# Patient Record
Sex: Male | Born: 1940 | Race: Black or African American | Hispanic: No | Smoking: Former smoker
Health system: Southern US, Community
[De-identification: ages and names within clinical notes are randomized; demographics above are authoritative.]

## PROBLEM LIST (undated history)

## (undated) DIAGNOSIS — N200 Calculus of kidney: Secondary | ICD-10-CM

## (undated) DIAGNOSIS — N400 Enlarged prostate without lower urinary tract symptoms: Secondary | ICD-10-CM

## (undated) DIAGNOSIS — Z8719 Personal history of other diseases of the digestive system: Secondary | ICD-10-CM

## (undated) DIAGNOSIS — I341 Nonrheumatic mitral (valve) prolapse: Secondary | ICD-10-CM

## (undated) DIAGNOSIS — K219 Gastro-esophageal reflux disease without esophagitis: Secondary | ICD-10-CM

## (undated) DIAGNOSIS — I639 Cerebral infarction, unspecified: Secondary | ICD-10-CM

## (undated) DIAGNOSIS — I10 Essential (primary) hypertension: Secondary | ICD-10-CM

## (undated) DIAGNOSIS — K5730 Diverticulosis of large intestine without perforation or abscess without bleeding: Secondary | ICD-10-CM

## (undated) DIAGNOSIS — M545 Low back pain, unspecified: Secondary | ICD-10-CM

## (undated) DIAGNOSIS — I2510 Atherosclerotic heart disease of native coronary artery without angina pectoris: Secondary | ICD-10-CM

## (undated) DIAGNOSIS — R220 Localized swelling, mass and lump, head: Secondary | ICD-10-CM

## (undated) DIAGNOSIS — G5603 Carpal tunnel syndrome, bilateral upper limbs: Secondary | ICD-10-CM

## (undated) DIAGNOSIS — K759 Inflammatory liver disease, unspecified: Secondary | ICD-10-CM

## (undated) DIAGNOSIS — E119 Type 2 diabetes mellitus without complications: Secondary | ICD-10-CM

## (undated) DIAGNOSIS — K4090 Unilateral inguinal hernia, without obstruction or gangrene, not specified as recurrent: Secondary | ICD-10-CM

## (undated) DIAGNOSIS — H269 Unspecified cataract: Secondary | ICD-10-CM

## (undated) DIAGNOSIS — E785 Hyperlipidemia, unspecified: Secondary | ICD-10-CM

## (undated) DIAGNOSIS — Z86010 Personal history of colonic polyps: Secondary | ICD-10-CM

## (undated) DIAGNOSIS — I6529 Occlusion and stenosis of unspecified carotid artery: Secondary | ICD-10-CM

## (undated) HISTORY — DX: Localized swelling, mass and lump, head: R22.0

## (undated) HISTORY — DX: Hyperlipidemia, unspecified: E78.5

## (undated) HISTORY — DX: Atherosclerotic heart disease of native coronary artery without angina pectoris: I25.10

## (undated) HISTORY — DX: Morbid (severe) obesity due to excess calories: E66.01

## (undated) HISTORY — DX: Unspecified cataract: H26.9

## (undated) HISTORY — DX: Type 2 diabetes mellitus without complications: E11.9

## (undated) HISTORY — DX: Occlusion and stenosis of unspecified carotid artery: I65.29

## (undated) HISTORY — DX: Essential (primary) hypertension: I10

## (undated) HISTORY — PX: CATARACT EXTRACTION: SUR2

## (undated) HISTORY — DX: Calculus of kidney: N20.0

## (undated) HISTORY — PX: APPENDECTOMY: SHX54

## (undated) HISTORY — DX: Inflammatory liver disease, unspecified: K75.9

## (undated) HISTORY — DX: Nonrheumatic mitral (valve) prolapse: I34.1

## (undated) HISTORY — DX: Low back pain, unspecified: M54.50

## (undated) HISTORY — PX: CARDIAC CATHETERIZATION: SHX172

## (undated) HISTORY — DX: Carpal tunnel syndrome, bilateral upper limbs: G56.03

## (undated) HISTORY — PX: CORONARY ARTERY BYPASS GRAFT: SHX141

## (undated) HISTORY — DX: Low back pain: M54.5

## (undated) HISTORY — DX: Benign prostatic hyperplasia without lower urinary tract symptoms: N40.0

## (undated) HISTORY — DX: Personal history of colonic polyps: Z86.010

## (undated) HISTORY — DX: Diverticulosis of large intestine without perforation or abscess without bleeding: K57.30

---

## 2003-12-04 LAB — HM COLONOSCOPY

## 2004-10-28 ENCOUNTER — Ambulatory Visit: Payer: Self-pay | Admitting: Internal Medicine

## 2005-04-24 ENCOUNTER — Ambulatory Visit: Payer: Self-pay | Admitting: Internal Medicine

## 2005-05-20 ENCOUNTER — Ambulatory Visit: Payer: Self-pay | Admitting: Internal Medicine

## 2005-10-22 ENCOUNTER — Ambulatory Visit: Payer: Self-pay | Admitting: Internal Medicine

## 2005-10-29 ENCOUNTER — Ambulatory Visit: Payer: Self-pay | Admitting: Internal Medicine

## 2006-06-18 ENCOUNTER — Ambulatory Visit: Payer: Self-pay | Admitting: Internal Medicine

## 2006-11-05 ENCOUNTER — Ambulatory Visit: Payer: Self-pay | Admitting: Internal Medicine

## 2006-11-05 LAB — CONVERTED CEMR LAB
BUN: 10 mg/dL (ref 6–23)
Basophils Absolute: 0 10*3/uL (ref 0.0–0.1)
Basophils Relative: 1 % (ref 0.0–1.0)
CO2: 34 meq/L — ABNORMAL HIGH (ref 19–32)
Calcium: 9.3 mg/dL (ref 8.4–10.5)
Eosinophils Absolute: 0.1 10*3/uL (ref 0.0–0.6)
GFR calc Af Amer: 86 mL/min
GFR calc non Af Amer: 71 mL/min
HDL: 39.3 mg/dL (ref >39.0)
Hgb A1c MFr Bld: 6.5 % — ABNORMAL HIGH (ref 4.6–6.0)
Lymphocytes Relative: 52.1 % — ABNORMAL HIGH (ref 12.0–46.0)
MCHC: 34.6 g/dL (ref 30.0–36.0)
Monocytes Relative: 10.6 % (ref 3.0–11.0)
Neutro Abs: 1.2 10*3/uL — ABNORMAL LOW (ref 1.4–7.7)
PSA: 0.41 ng/mL (ref 0.10–4.00)
Platelets: 146 10*3/uL — ABNORMAL LOW (ref 150–400)
TSH: 0.88 microintl units/mL (ref 0.35–5.50)
Triglycerides: 58 mg/dL (ref 0–149)
VLDL: 12 mg/dL (ref 0–40)

## 2007-04-02 ENCOUNTER — Encounter: Payer: Self-pay | Admitting: Internal Medicine

## 2007-04-02 DIAGNOSIS — H269 Unspecified cataract: Secondary | ICD-10-CM | POA: Insufficient documentation

## 2007-04-02 DIAGNOSIS — I2510 Atherosclerotic heart disease of native coronary artery without angina pectoris: Secondary | ICD-10-CM | POA: Insufficient documentation

## 2007-04-02 DIAGNOSIS — E785 Hyperlipidemia, unspecified: Secondary | ICD-10-CM | POA: Insufficient documentation

## 2007-04-02 DIAGNOSIS — I10 Essential (primary) hypertension: Secondary | ICD-10-CM | POA: Insufficient documentation

## 2007-04-02 DIAGNOSIS — G5600 Carpal tunnel syndrome, unspecified upper limb: Secondary | ICD-10-CM | POA: Insufficient documentation

## 2007-04-05 DIAGNOSIS — N400 Enlarged prostate without lower urinary tract symptoms: Secondary | ICD-10-CM | POA: Insufficient documentation

## 2007-05-27 ENCOUNTER — Ambulatory Visit: Payer: Self-pay | Admitting: Internal Medicine

## 2007-06-23 ENCOUNTER — Ambulatory Visit: Payer: Self-pay | Admitting: Internal Medicine

## 2007-10-04 ENCOUNTER — Encounter: Payer: Self-pay | Admitting: Internal Medicine

## 2007-10-06 ENCOUNTER — Encounter: Payer: Self-pay | Admitting: Internal Medicine

## 2007-10-11 ENCOUNTER — Encounter: Payer: Self-pay | Admitting: Internal Medicine

## 2007-11-19 ENCOUNTER — Telehealth: Payer: Self-pay | Admitting: Internal Medicine

## 2007-11-23 ENCOUNTER — Encounter: Payer: Self-pay | Admitting: Internal Medicine

## 2007-12-08 ENCOUNTER — Ambulatory Visit: Payer: Self-pay | Admitting: Internal Medicine

## 2007-12-08 DIAGNOSIS — M545 Low back pain, unspecified: Secondary | ICD-10-CM | POA: Insufficient documentation

## 2007-12-08 DIAGNOSIS — J309 Allergic rhinitis, unspecified: Secondary | ICD-10-CM | POA: Insufficient documentation

## 2007-12-08 DIAGNOSIS — E119 Type 2 diabetes mellitus without complications: Secondary | ICD-10-CM | POA: Insufficient documentation

## 2007-12-08 DIAGNOSIS — Z87442 Personal history of urinary calculi: Secondary | ICD-10-CM | POA: Insufficient documentation

## 2007-12-08 DIAGNOSIS — K5730 Diverticulosis of large intestine without perforation or abscess without bleeding: Secondary | ICD-10-CM | POA: Insufficient documentation

## 2007-12-08 LAB — CONVERTED CEMR LAB
ALT: 34 units/L (ref 0–53)
Albumin: 3.6 g/dL (ref 3.5–5.2)
Alkaline Phosphatase: 61 units/L (ref 39–117)
BUN: 13 mg/dL (ref 6–23)
Bilirubin, Direct: 0.1 mg/dL (ref 0.0–0.3)
CO2: 35 meq/L — ABNORMAL HIGH (ref 19–32)
Eosinophils Relative: 2.5 % (ref 0.0–5.0)
GFR calc Af Amer: 108 mL/min
Glucose, Bld: 112 mg/dL — ABNORMAL HIGH (ref 70–99)
HCT: 41 % (ref 39.0–52.0)
Hemoglobin: 13.6 g/dL (ref 13.0–17.0)
Leukocytes, UA: NEGATIVE
Lymphocytes Relative: 43.6 % (ref 12.0–46.0)
Microalb Creat Ratio: 11.4 mg/g (ref 0.0–30.0)
Monocytes Absolute: 0.4 10*3/uL (ref 0.1–1.0)
Monocytes Relative: 8.4 % (ref 3.0–12.0)
Neutro Abs: 2 10*3/uL (ref 1.4–7.7)
Nitrite: NEGATIVE
Platelets: 191 10*3/uL (ref 150–400)
Potassium: 4.2 meq/L (ref 3.5–5.1)
Specific Gravity, Urine: 1.01 (ref 1.000–1.03)
TSH: 1.02 microintl units/mL (ref 0.35–5.50)
Total CHOL/HDL Ratio: 3.3
Total Protein, Urine: NEGATIVE mg/dL
Total Protein: 6.3 g/dL (ref 6.0–8.3)
WBC: 4.5 10*3/uL (ref 4.5–10.5)
pH: 6.5 (ref 5.0–8.0)

## 2008-01-27 ENCOUNTER — Ambulatory Visit: Payer: Self-pay | Admitting: Internal Medicine

## 2008-01-27 DIAGNOSIS — G459 Transient cerebral ischemic attack, unspecified: Secondary | ICD-10-CM | POA: Insufficient documentation

## 2008-01-31 ENCOUNTER — Encounter (INDEPENDENT_AMBULATORY_CARE_PROVIDER_SITE_OTHER): Payer: Self-pay | Admitting: *Deleted

## 2008-02-03 ENCOUNTER — Ambulatory Visit: Payer: Self-pay

## 2008-02-03 ENCOUNTER — Encounter: Payer: Self-pay | Admitting: Internal Medicine

## 2008-02-07 ENCOUNTER — Encounter: Admission: RE | Admit: 2008-02-07 | Discharge: 2008-02-07 | Payer: Self-pay | Admitting: Internal Medicine

## 2008-02-25 ENCOUNTER — Encounter: Payer: Self-pay | Admitting: Internal Medicine

## 2008-04-12 ENCOUNTER — Encounter: Payer: Self-pay | Admitting: Internal Medicine

## 2008-05-02 ENCOUNTER — Ambulatory Visit: Payer: Self-pay | Admitting: Internal Medicine

## 2008-05-12 ENCOUNTER — Telehealth (INDEPENDENT_AMBULATORY_CARE_PROVIDER_SITE_OTHER): Payer: Self-pay | Admitting: *Deleted

## 2008-05-12 ENCOUNTER — Ambulatory Visit: Payer: Self-pay | Admitting: Internal Medicine

## 2008-05-12 DIAGNOSIS — R21 Rash and other nonspecific skin eruption: Secondary | ICD-10-CM | POA: Insufficient documentation

## 2008-05-29 ENCOUNTER — Encounter: Payer: Self-pay | Admitting: Internal Medicine

## 2008-06-08 ENCOUNTER — Ambulatory Visit: Payer: Self-pay | Admitting: Internal Medicine

## 2008-06-08 LAB — CONVERTED CEMR LAB
BUN: 13 mg/dL (ref 6–23)
Calcium: 9.1 mg/dL (ref 8.4–10.5)
Cholesterol: 110 mg/dL (ref 0–200)
Creatinine, Ser: 1 mg/dL (ref 0.4–1.5)
GFR calc Af Amer: 95 mL/min
GFR calc non Af Amer: 79 mL/min
Glucose, Bld: 107 mg/dL — ABNORMAL HIGH (ref 70–99)
HDL: 39 mg/dL (ref >39.0)
LDL Cholesterol: 64 mg/dL (ref 0–99)
Potassium: 4.1 meq/L (ref 3.5–5.1)
Triglycerides: 34 mg/dL (ref 0–149)
VLDL: 7 mg/dL (ref 0–40)

## 2008-06-15 ENCOUNTER — Ambulatory Visit: Payer: Self-pay | Admitting: Internal Medicine

## 2008-06-27 ENCOUNTER — Telehealth: Payer: Self-pay | Admitting: Internal Medicine

## 2008-07-10 ENCOUNTER — Ambulatory Visit: Payer: Self-pay | Admitting: Internal Medicine

## 2008-11-13 ENCOUNTER — Encounter: Payer: Self-pay | Admitting: Internal Medicine

## 2008-12-07 ENCOUNTER — Ambulatory Visit: Payer: Self-pay | Admitting: Internal Medicine

## 2008-12-07 LAB — CONVERTED CEMR LAB
ALT: 29 units/L (ref 0–53)
Alkaline Phosphatase: 70 units/L (ref 39–117)
Basophils Relative: 0.2 % (ref 0.0–3.0)
Bilirubin Urine: NEGATIVE
Bilirubin, Direct: 0.2 mg/dL (ref 0.0–0.3)
CO2: 34 meq/L — ABNORMAL HIGH (ref 19–32)
Chloride: 103 meq/L (ref 96–112)
Creatinine, Ser: 1.2 mg/dL (ref 0.4–1.5)
Creatinine,U: 166.5 mg/dL
Eosinophils Relative: 4 % (ref 0.0–5.0)
Hemoglobin, Urine: NEGATIVE
Hemoglobin: 13.9 g/dL (ref 13.0–17.0)
Hgb A1c MFr Bld: 6.4 % (ref 4.6–6.5)
Ketones, ur: NEGATIVE mg/dL
LDL Cholesterol: 68 mg/dL (ref 0–99)
MCHC: 34.4 g/dL (ref 30.0–36.0)
MCV: 98.1 fL (ref 78.0–100.0)
Microalb Creat Ratio: 2.4 mg/g (ref 0.0–30.0)
Microalb, Ur: 0.4 mg/dL (ref 0.0–1.9)
Monocytes Absolute: 0.4 10*3/uL (ref 0.1–1.0)
Neutro Abs: 1.3 10*3/uL — ABNORMAL LOW (ref 1.4–7.7)
Neutrophils Relative %: 35.9 % — ABNORMAL LOW (ref 43.0–77.0)
Potassium: 4.1 meq/L (ref 3.5–5.1)
RBC: 4.12 M/uL — ABNORMAL LOW (ref 4.22–5.81)
Sodium: 141 meq/L (ref 135–145)
TSH: 0.82 microintl units/mL (ref 0.35–5.50)
Total CHOL/HDL Ratio: 3
Total Protein: 6.1 g/dL (ref 6.0–8.3)
Triglycerides: 47 mg/dL (ref 0.0–149.0)
Urine Glucose: NEGATIVE mg/dL
Urobilinogen, UA: 0.2 (ref 0.0–1.0)
WBC: 3.7 10*3/uL — ABNORMAL LOW (ref 4.5–10.5)

## 2008-12-14 ENCOUNTER — Ambulatory Visit: Payer: Self-pay | Admitting: Internal Medicine

## 2009-02-08 ENCOUNTER — Ambulatory Visit: Payer: Self-pay

## 2009-02-08 ENCOUNTER — Encounter: Payer: Self-pay | Admitting: Internal Medicine

## 2009-02-13 ENCOUNTER — Telehealth: Payer: Self-pay | Admitting: Internal Medicine

## 2009-02-14 ENCOUNTER — Telehealth: Payer: Self-pay | Admitting: Internal Medicine

## 2009-04-12 ENCOUNTER — Telehealth: Payer: Self-pay | Admitting: Internal Medicine

## 2009-05-08 ENCOUNTER — Telehealth: Payer: Self-pay | Admitting: Internal Medicine

## 2009-05-16 ENCOUNTER — Ambulatory Visit: Payer: Self-pay | Admitting: Internal Medicine

## 2009-05-17 ENCOUNTER — Encounter: Payer: Self-pay | Admitting: Internal Medicine

## 2009-06-06 ENCOUNTER — Ambulatory Visit: Payer: Self-pay | Admitting: Internal Medicine

## 2009-06-06 LAB — CONVERTED CEMR LAB
BUN: 13 mg/dL (ref 6–23)
CO2: 34 meq/L — ABNORMAL HIGH (ref 19–32)
Calcium: 8.9 mg/dL (ref 8.4–10.5)
Creatinine, Ser: 1.1 mg/dL (ref 0.4–1.5)
GFR calc non Af Amer: 84.97 mL/min (ref >60)
Glucose, Bld: 113 mg/dL — ABNORMAL HIGH (ref 70–99)
Hgb A1c MFr Bld: 6.4 % (ref 4.6–6.5)
Total CHOL/HDL Ratio: 3

## 2009-06-07 ENCOUNTER — Encounter: Payer: Self-pay | Admitting: Internal Medicine

## 2009-06-13 ENCOUNTER — Ambulatory Visit: Payer: Self-pay | Admitting: Internal Medicine

## 2009-06-19 ENCOUNTER — Encounter: Payer: Self-pay | Admitting: Internal Medicine

## 2009-06-23 ENCOUNTER — Ambulatory Visit: Payer: Self-pay | Admitting: Family Medicine

## 2009-06-23 DIAGNOSIS — IMO0002 Reserved for concepts with insufficient information to code with codable children: Secondary | ICD-10-CM | POA: Insufficient documentation

## 2009-06-27 ENCOUNTER — Ambulatory Visit: Payer: Self-pay | Admitting: Internal Medicine

## 2009-08-09 ENCOUNTER — Encounter: Payer: Self-pay | Admitting: Internal Medicine

## 2009-09-03 ENCOUNTER — Telehealth: Payer: Self-pay | Admitting: Internal Medicine

## 2009-12-13 ENCOUNTER — Ambulatory Visit: Payer: Self-pay | Admitting: Internal Medicine

## 2009-12-13 LAB — CONVERTED CEMR LAB
Creatinine,U: 136.9 mg/dL
Hgb A1c MFr Bld: 6.3 % (ref 4.6–6.5)
Microalb Creat Ratio: 0.7 mg/g (ref 0.0–30.0)

## 2009-12-20 ENCOUNTER — Ambulatory Visit: Payer: Self-pay | Admitting: Internal Medicine

## 2009-12-20 LAB — CONVERTED CEMR LAB
Alkaline Phosphatase: 67 units/L (ref 39–117)
BUN: 14 mg/dL (ref 6–23)
Basophils Relative: 0.7 % (ref 0.0–3.0)
Bilirubin Urine: NEGATIVE
Bilirubin, Direct: 0.1 mg/dL (ref 0.0–0.3)
Calcium: 9.3 mg/dL (ref 8.4–10.5)
Creatinine, Ser: 1 mg/dL (ref 0.4–1.5)
Eosinophils Absolute: 0.1 10*3/uL (ref 0.0–0.7)
Eosinophils Relative: 3.2 % (ref 0.0–5.0)
HDL: 47.9 mg/dL (ref >39.00)
Hemoglobin, Urine: NEGATIVE
Ketones, ur: NEGATIVE mg/dL
LDL Cholesterol: 61 mg/dL (ref 0–99)
Lymphocytes Relative: 51.6 % — ABNORMAL HIGH (ref 12.0–46.0)
MCHC: 34.1 g/dL (ref 30.0–36.0)
MCV: 99 fL (ref 78.0–100.0)
Monocytes Absolute: 0.4 10*3/uL (ref 0.1–1.0)
Neutrophils Relative %: 35.7 % — ABNORMAL LOW (ref 43.0–77.0)
Platelets: 148 10*3/uL — ABNORMAL LOW (ref 150.0–400.0)
RBC: 4.2 M/uL — ABNORMAL LOW (ref 4.22–5.81)
TSH: 0.76 microintl units/mL (ref 0.35–5.50)
Total Bilirubin: 1.1 mg/dL (ref 0.3–1.2)
Total CHOL/HDL Ratio: 2
Total Protein, Urine: NEGATIVE mg/dL
Total Protein: 6.3 g/dL (ref 6.0–8.3)
Triglycerides: 49 mg/dL (ref 0.0–149.0)
Urine Glucose: NEGATIVE mg/dL
Urobilinogen, UA: 0.2 (ref 0.0–1.0)
WBC: 4.1 10*3/uL — ABNORMAL LOW (ref 4.5–10.5)

## 2010-01-17 ENCOUNTER — Telehealth: Payer: Self-pay | Admitting: Internal Medicine

## 2010-02-13 ENCOUNTER — Encounter: Payer: Self-pay | Admitting: Internal Medicine

## 2010-02-14 ENCOUNTER — Ambulatory Visit: Payer: Self-pay

## 2010-02-14 ENCOUNTER — Encounter: Payer: Self-pay | Admitting: Cardiovascular Disease

## 2010-03-28 ENCOUNTER — Ambulatory Visit: Payer: Self-pay | Admitting: Internal Medicine

## 2010-04-24 ENCOUNTER — Telehealth (INDEPENDENT_AMBULATORY_CARE_PROVIDER_SITE_OTHER): Payer: Self-pay | Admitting: *Deleted

## 2010-04-24 ENCOUNTER — Encounter: Payer: Self-pay | Admitting: Internal Medicine

## 2010-04-25 ENCOUNTER — Telehealth: Payer: Self-pay | Admitting: Internal Medicine

## 2010-05-22 ENCOUNTER — Ambulatory Visit: Payer: Self-pay | Admitting: Internal Medicine

## 2010-05-27 ENCOUNTER — Telehealth: Payer: Self-pay | Admitting: Internal Medicine

## 2010-05-30 ENCOUNTER — Encounter: Payer: Self-pay | Admitting: Internal Medicine

## 2010-06-03 ENCOUNTER — Encounter: Payer: Self-pay | Admitting: Internal Medicine

## 2010-08-06 ENCOUNTER — Telehealth: Payer: Self-pay | Admitting: Internal Medicine

## 2010-08-20 ENCOUNTER — Telehealth: Payer: Self-pay | Admitting: Internal Medicine

## 2010-08-26 ENCOUNTER — Telehealth: Payer: Self-pay | Admitting: Internal Medicine

## 2010-09-03 NOTE — Miscellaneous (Signed)
Summary: Orders Update  Clinical Lists Changes  Orders: Added new Test order of Carotid Duplex (Carotid Duplex) - Signed 

## 2010-09-03 NOTE — Medication Information (Signed)
Summary: Enrollment forms/[employer]  Enrollment forms/[employer]   Imported By: Sherian Rein 06/03/2010 11:42:33  _____________________________________________________________________  External Attachment:    Type:   Image     Comment:   External Document

## 2010-09-03 NOTE — Letter (Signed)
Summary: [facility]  [facility]   Imported By: Lester [place] 08/16/2009 09:56:05  _____________________________________________________________________  External Attachment:    Type:   Image     Comment:   External Document

## 2010-09-03 NOTE — Progress Notes (Signed)
Summary: Medication change  Phone Note Call from Patient Call back at Home Phone [phone]   Caller: Patient Summary of Call: pt called to clarify medications. Pt does not take potassium but he does however take Folic Acid. Pt is requesting medication list reflect update. Initial call taken by: Margaret Pyle, CMA,  January 17, 2010 2:12 PM    New/Updated Medications: FOLIC ACID 400 MCG TABS (FOLIC ACID) 1 by mouth once daily

## 2010-09-03 NOTE — Progress Notes (Signed)
Summary: RX Change  Phone Note Call from Patient Call back at Home Phone [phone]   Summary of Call: Patient left message on triage that he needs an prescription changed but did not say what the medication was. I called patient and he is in line at the bank, he will call back later. Initial call taken by: Lucious Groves,  September 03, 2009 4:33 PM  Follow-up for Phone Call        left message on machine for pt to return my call  Follow-up by: Margaret Pyle, CMA,  September 04, 2009 2:27 PM  Additional Follow-up for Phone Call Additional follow up Details #1::        left message on machine to call back to office. Additional Follow-up by: Lucious Groves,  September 04, 2009 3:10 PM    Additional Follow-up for Phone Call Additional follow up Details #2::    Pt wants to change from  Cialis to Levitra 20 mg -- [facility] on [address]. Follow-up by: Verdell Face,  September 04, 2009 4:31 PM  Additional Follow-up for Phone Call Additional follow up Details #3:: Details for Additional Follow-up Action Taken: done escript  pt informed via VM. old to call back with any further question or concerns. Margaret Pyle, CMA  September 05, 2009 8:57 AM  Additional Follow-up by: Corwin Levins MD,  September 05, 2009 8:49 AM  New/Updated Medications: LEVITRA 20 MG TABS (VARDENAFIL HCL) 1 by mouth every other day as needed Prescriptions: LEVITRA 20 MG TABS (VARDENAFIL HCL) 1 by mouth every other day as needed  #5 x 11   Entered and Authorized by:   Corwin Levins MD   Signed by:   Corwin Levins MD on 09/05/2009   Method used:   Electronically to        [facility] (retail)       [address]       [place], [place]  [address]       Ph: [phone]       Fax: [phone]   RxID:   [id number]

## 2010-09-03 NOTE — Progress Notes (Signed)
Summary: Samples  Phone Note Call from Patient Call back at [facility] Phone [phone]   Summary of Call: Pt left vm on triage, he is req samples of meds.  Initial call taken by: Lamar Sprinkles, CMA,  May 27, 2010 1:47 PM  Follow-up for Phone Call        Pt informed via VM sample in cabinet for pt pick up Follow-up by: Margaret Pyle, CMA,  May 28, 2010 9:08 AM

## 2010-09-03 NOTE — Assessment & Plan Note (Signed)
Summary: 6 mo rov /nws   #   Vital Signs:  Patient profile:   70 year old male Height:      66 inches Weight:      229.50 pounds BMI:     37.18 O2 Sat:      95 % on Room air Temp:     98 degrees F oral Pulse rate:   57 / minute BP sitting:   120 / 62  (left arm) Cuff size:   large  Vitals Entered By: Zella Ball Ewing CMA (AAMA) (March 28, 2010 9:25 AM)  O2 Flow:  Room air CC: 6 month ROV/RE   CC:  6 month ROV/RE.  History of Present Illness: wt down from 236 to 229 today ;  goes to the gym 3 days per wk;  golf  twice per wk - trying to stay active;  Pt denies CP, worsening sob, doe, wheezing, orthopnea, pnd, worsening LE edema, palps, dizziness or syncope  Pt denies new neuro symptoms such as headache, facial or extremity weakness   Pt denies polydipsia, polyuria, or low sugar symptoms such as shakiness improved with eating.  Overall good compliance with meds, trying to follow low chol, DM diet, wt stable, little excercise however No fever, wt loss, night sweats, loss of appetite or other constitutional symptoms   Problems Prior to Update: 1)  Abrasion, Face  (ICD-910.0) 2)  Preventive Health Care  (ICD-V70.0) 3)  Special Screening Malignant Neoplasm of Prostate  (ICD-V76.44) 4)  Rash-nonvesicular  (ICD-782.1) 5)  Transient Ischemic Attack  (ICD-435.9) 6)  Allergic Rhinitis  (ICD-477.9) 7)  Low Back Pain  (ICD-724.2) 8)  Nephrolithiasis, Hx of  (ICD-V13.01) 9)  Diabetes Mellitus, Type II  (ICD-250.00) 10)  Preventive Health Care  (ICD-V70.0) 11)  Diverticulosis, Colon  (ICD-562.10) 12)  Carpal Tunnel Syndrome, Bilateral  (ICD-354.0) 13)  Cataract Nos  (ICD-366.9) 14)  Benign Prostatic Hypertrophy  (ICD-600.00) 15)  Morbid Obesity  (ICD-278.01) 16)  Hypertension  (ICD-401.9) 17)  Hyperlipidemia  (ICD-272.4) 18)  Coronary Artery Disease  (ICD-414.00)  Medications Prior to Update: 1)  Levitra 20 Mg Tabs (Vardenafil Hcl) .Marland Kitchen.. 1 By Mouth Every Other Day As Needed 2)  Lipitor 80  Mg Tabs (Atorvastatin Calcium) .... 1/2 Po Once Daily 3)  Niaspan 500 Mg Cr-Tabs (Niacin (Antihyperlipidemic)) .... 3 By Mouth Once Daily 4)  Daily Multi   Tabs (Multiple Vitamins-Minerals) .Marland Kitchen.. 1 By Mouth Qd 5)  Vitamin C 500 Mg  Tabs (Ascorbic Acid) .Marland Kitchen.. 1 By Mouth Qd 6)  Diovan Hct 160-12.5 Mg  Tabs (Valsartan-Hydrochlorothiazide) .Marland Kitchen.. 1 By Mouth Once Daily 7)  Toprol Xl 50 Mg  Tb24 (Metoprolol Succinate) .Marland Kitchen.. 1 1/2 Tab Once Daily 75 Mg 8)  Plavix 75 Mg Tabs (Clopidogrel Bisulfate) .Marland Kitchen.. 1 By Mouth Once Daily 9)  Adult Aspirin Low Strength 81 Mg Tbdp (Aspirin) .Marland Kitchen.. 1 Daily 10)  Folic Acid 400 Mcg Tabs (Folic Acid) .Marland Kitchen.. 1 By Mouth Once Daily  Current Medications (verified): 1)  Levitra 20 Mg Tabs (Vardenafil Hcl) .Marland Kitchen.. 1 By Mouth Every Other Day As Needed 2)  Lipitor 80 Mg Tabs (Atorvastatin Calcium) .... 1/2 Po Once Daily 3)  Niaspan 500 Mg Cr-Tabs (Niacin (Antihyperlipidemic)) .... 3 By Mouth Once Daily 4)  Daily Multi   Tabs (Multiple Vitamins-Minerals) .Marland Kitchen.. 1 By Mouth Qd 5)  Vitamin C 500 Mg  Tabs (Ascorbic Acid) .Marland Kitchen.. 1 By Mouth Qd 6)  Diovan Hct 160-12.5 Mg  Tabs (Valsartan-Hydrochlorothiazide) .Marland Kitchen.. 1 By Mouth Once Daily 7)  Toprol Xl 50 Mg  Tb24 (Metoprolol Succinate) .Marland Kitchen.. 1 1/2 Tab Once Daily 75 Mg 8)  Plavix 75 Mg Tabs (Clopidogrel Bisulfate) .Marland Kitchen.. 1 By Mouth Once Daily 9)  Adult Aspirin Low Strength 81 Mg Tbdp (Aspirin) .Marland Kitchen.. 1 Daily 10)  Folic Acid 400 Mcg Tabs (Folic Acid) .Marland Kitchen.. 1 By Mouth Once Daily  Allergies (verified): No Known Drug Allergies  Past History:  Past Medical History: Last updated: 12/20/2009 Coronary artery disease - Dr Kelly/card Hyperlipidemia Hypertension Hepatitis Morbid Obesity Benign prostatic hypertrophy Cataracts Bilateral Carpal Tunnel Syndrome Diverticulosis, colon Diabetes mellitus, type II Nephrolithiasis, hx of Low back pain Allergic rhinitis occipital post head mass - chronic  Past Surgical History: Last updated:  04/02/2007 Appendectomy Coronary artery bypass graft  Social History: Last updated: 12/20/2009 widow 2 children Former Smoker Alcohol use-no 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   Head:  normocephalic and atraumatic.   Eyes:  vision grossly intact, pupils equal, and pupils round.   Ears:  R ear normal and L ear normal.   Nose:  no external deformity and no nasal discharge.   Mouth:  no gingival abnormalities and pharynx pink and moist.   Neck:  supple and no masses.   Lungs:  normal respiratory effort and normal breath sounds.   Heart:  normal rate and regular rhythm.   Abdomen:  soft, non-tender, and normal bowel sounds.   Extremities:  no edema, no erythema    Impression & Recommendations:  Problem # 1:  DIABETES MELLITUS, TYPE II (ICD-250.00)  His updated medication list for this problem includes:    Diovan Hct 160-12.5 Mg Tabs (Valsartan-hydrochlorothiazide) .Marland Kitchen... 1 by mouth once daily    Adult Aspirin Low Strength 81 Mg Tbdp (Aspirin) .Marland Kitchen... 1 daily  Labs Reviewed: Creat: 1.0 (12/20/2009)    Reviewed HgBA1c results: 6.3 (12/13/2009)  6.4 (06/06/2009) stable overall by hx and exam, ok to continue meds/tx as is   Problem # 2:  HYPERTENSION (ICD-401.9)  His updated medication list for this problem includes:    Diovan Hct 160-12.5 Mg Tabs (Valsartan-hydrochlorothiazide) .Marland Kitchen... 1 by mouth once daily    Toprol Xl 50 Mg Tb24 (Metoprolol succinate) .Marland Kitchen... 1 1/2 tab once daily 75 mg  BP today: 120/62 Prior BP: 110/62 (12/20/2009)  Labs Reviewed: K+: 4.4 (12/20/2009) Creat: : 1.0 (12/20/2009)   Chol: 119 (12/20/2009)   HDL: 47.90 (12/20/2009)   LDL: 61 (12/20/2009)   TG: 49.0 (12/20/2009) stable overall by hx and exam, ok to continue meds/tx as is   Problem # 3:  HYPERLIPIDEMIA  (ICD-272.4)  His updated medication list for this problem includes:    Lipitor 80 Mg Tabs (Atorvastatin calcium) .Marland Kitchen... 1/2 po once daily    Niaspan 500 Mg Cr-tabs (Niacin (antihyperlipidemic)) .Marland KitchenMarland KitchenMarland KitchenMarland Kitchen 3 by mouth once daily  Labs Reviewed: SGOT: 45 (12/20/2009)   SGPT: 43 (12/20/2009)   HDL:47.90 (12/20/2009), 37.50 (06/06/2009)  LDL:61 (12/20/2009), 65 (06/06/2009)  Chol:119 (12/20/2009), 111 (06/06/2009)  Trig:49.0 (12/20/2009), 41.0 (06/06/2009) stable overall by hx and exam, ok to continue meds/tx as is   Complete Medication List: 1)  Levitra 20 Mg Tabs (Vardenafil hcl) .Marland Kitchen.. 1 by mouth every other day as needed 2)  Lipitor 80 Mg Tabs (Atorvastatin calcium) .... 1/2 po once daily 3)  Niaspan 500 Mg Cr-tabs (Niacin (antihyperlipidemic)) .... 3  by mouth once daily 4)  Daily Multi Tabs (Multiple vitamins-minerals) .Marland Kitchen.. 1 by mouth qd 5)  Vitamin C 500 Mg Tabs (Ascorbic acid) .Marland Kitchen.. 1 by mouth qd 6)  Diovan Hct 160-12.5 Mg Tabs (Valsartan-hydrochlorothiazide) .Marland Kitchen.. 1 by mouth once daily 7)  Toprol Xl 50 Mg Tb24 (Metoprolol succinate) .Marland Kitchen.. 1 1/2 tab once daily 75 mg 8)  Plavix 75 Mg Tabs (Clopidogrel bisulfate) .Marland Kitchen.. 1 by mouth once daily 9)  Adult Aspirin Low Strength 81 Mg Tbdp (Aspirin) .Marland Kitchen.. 1 daily 10)  Folic Acid 400 Mcg Tabs (Folic acid) .Marland Kitchen.. 1 by mouth once daily  Patient Instructions: 1)  You are given the samples today 2)  Continue all previous medications as before this visit  3)  Please schedule a follow-up appointment in 6 months with : 4)  BMP prior to visit, ICD-9: 250.02 5)  Lipid Panel prior to visit, ICD-9: 6)  HbgA1C prior to visit, ICD-9: 7)   (you can cancel any other appt before then with me)

## 2010-09-03 NOTE — Progress Notes (Signed)
  Phone Note Refill Request Message from:  Fax from Pharmacy on April 25, 2010 3:37 PM  Refills Requested: Medication #1:  LIPITOR 80 MG TABS 1/2 po once daily   Dosage confirmed as above?Dosage Confirmed   Notes: [facility] Initial call taken by: Robin Ewing CMA Duncan Dull),  April 25, 2010 3:38 PM    Prescriptions: LIPITOR 80 MG TABS (ATORVASTATIN CALCIUM) 1/2 po once daily  #90 x 3   Entered by:   Scharlene Gloss CMA (AAMA)   Authorized by:   Corwin Levins MD   Signed by:   Scharlene Gloss CMA (AAMA) on 04/25/2010   Method used:   Faxed to ...       [facility]order)             , [place]         Ph:        Fax: [phone]   RxID:   [id number]

## 2010-09-03 NOTE — Progress Notes (Signed)
   Doppler faxed over to [facility] @ [phone] [facility]  April 24, 2010 10:24 AM

## 2010-09-03 NOTE — Assessment & Plan Note (Signed)
Summary: FLU SHOT/PN   Nurse Visit   Allergies: No Known Drug Allergies  Orders Added: 1)  Flu Vaccine 55yrs + MEDICARE PATIENTS [Q2039] 2)  Administration Flu vaccine - MCR [G0008] .lbmedflu   Flu Vaccine Consent Questions     Do you have a history of severe allergic reactions to this vaccine? no    Any prior history of allergic reactions to egg and/or gelatin? no    Do you have a sensitivity to the preservative Thimersol? no    Do you have a past history of Guillan-Barre Syndrome? no    Do you currently have an acute febrile illness? no    Have you ever had a severe reaction to latex? no    Vaccine information given and explained to patient? yes    Are you currently pregnant? no    Lot Number:[id number]   Exp Date:02/01/2011   Site Given  Left Deltoid IM Lanier Prude, [facility])  May 22, 2010 11:51 AM

## 2010-09-03 NOTE — Medication Information (Signed)
Summary: Denied/[employer]  Denied/[employer]   Imported By: Sherian Rein 06/21/2010 11:19:25  _____________________________________________________________________  External Attachment:    Type:   Image     Comment:   External Document

## 2010-09-03 NOTE — Letter (Signed)
Summary: [facility]  [facility]   Imported By: Lennie Odor 05/08/2010 15:10:55  _____________________________________________________________________  External Attachment:    Type:   Image     Comment:   External Document

## 2010-09-03 NOTE — Medication Information (Signed)
Summary: Enrollment forms/[employer]  Enrollment forms/[employer]   Imported By: Sherian Rein 06/03/2010 11:39:47  _____________________________________________________________________  External Attachment:    Type:   Image     Comment:   External Document

## 2010-09-03 NOTE — Assessment & Plan Note (Signed)
Summary: 6 mos f/u #/cd   Vital Signs:  Patient profile:   70 year old male Height:      66 inches Weight:      236.25 pounds BMI:     38.27 O2 Sat:      94 % on Room air Temp:     98.2 degrees F oral Pulse rate:   58 / minute BP sitting:   110 / 62  (left arm) Cuff size:   large  Vitals Entered ByZella Ball Ewing (Dec 20, 2009 9:58 AM)  O2 Flow:  Room air  CC: 6 month ROV/RE   CC:  6 month ROV/RE.  History of Present Illness: here with stinging effect to the right temple twice in the past several days, but no pain, no weakness, trauma; swelling or redness and no symptoms at this time;  Pt denies CP, sob, doe, wheezing, orthopnea, pnd, worsening LE edema, palps, dizziness or syncope  Pt denies new neuro symptoms such as headache, facial or extremity weakness   Overall good med compliacne and tolerance.    Preventive Screening-Counseling & Management      Drug Use:  no.    Problems Prior to Update: 1)  Abrasion, Face  (ICD-910.0) 2)  Preventive Health Care  (ICD-V70.0) 3)  Special Screening Malignant Neoplasm of Prostate  (ICD-V76.44) 4)  Rash-nonvesicular  (ICD-782.1) 5)  Transient Ischemic Attack  (ICD-435.9) 6)  Allergic Rhinitis  (ICD-477.9) 7)  Low Back Pain  (ICD-724.2) 8)  Nephrolithiasis, Hx of  (ICD-V13.01) 9)  Diabetes Mellitus, Type II  (ICD-250.00) 10)  Preventive Health Care  (ICD-V70.0) 11)  Diverticulosis, Colon  (ICD-562.10) 12)  Carpal Tunnel Syndrome, Bilateral  (ICD-354.0) 13)  Cataract Nos  (ICD-366.9) 14)  Benign Prostatic Hypertrophy  (ICD-600.00) 15)  Morbid Obesity  (ICD-278.01) 16)  Hypertension  (ICD-401.9) 17)  Hyperlipidemia  (ICD-272.4) 18)  Coronary Artery Disease  (ICD-414.00)  Medications Prior to Update: 1)  Levitra 20 Mg Tabs (Vardenafil Hcl) .Marland Kitchen.. 1 By Mouth Every Other Day As Needed 2)  Lipitor 40 Mg  Tabs (Atorvastatin Calcium) .Marland Kitchen.. 1 By Mouth Once Daily 3)  Niaspan 1000 Mg  Tbcr (Niacin (Antihyperlipidemic)) .Marland Kitchen.. 1 By Mouth Q  Pm 4)  Daily Multi   Tabs (Multiple Vitamins-Minerals) .Marland Kitchen.. 1 By Mouth Qd 5)  Vitamin C 500 Mg  Tabs (Ascorbic Acid) .Marland Kitchen.. 1 By Mouth Qd 6)  Glucosamine Chondr 500 Complex   Caps (Glucosamine-Chondroit-Vit C-Mn) .Marland Kitchen.. 1 By Mouth Qd 7)  Diovan Hct 160-12.5 Mg  Tabs (Valsartan-Hydrochlorothiazide) .Marland Kitchen.. 1 By Mouth Once Daily 8)  Toprol Xl 50 Mg  Tb24 (Metoprolol Succinate) .Marland Kitchen.. 1 1/2 Tab Once Daily 75 Mg 9)  Plavix 75 Mg Tabs (Clopidogrel Bisulfate) .Marland Kitchen.. 1 By Mouth Once Daily 10)  Adult Aspirin Low Strength 81 Mg Tbdp (Aspirin) .Marland Kitchen.. 1 Daily 11)  Doxycycline Hyclate 100 Mg Caps (Doxycycline Hyclate) .Marland Kitchen.. 1po Two Times A Day  Current Medications (verified): 1)  Levitra 20 Mg Tabs (Vardenafil Hcl) .Marland Kitchen.. 1 By Mouth Every Other Day As Needed 2)  Lipitor 80 Mg Tabs (Atorvastatin Calcium) .... 1/2 Po Once Daily 3)  Niaspan 500 Mg Cr-Tabs (Niacin (Antihyperlipidemic)) .... 3 By Mouth Once Daily 4)  Daily Multi   Tabs (Multiple Vitamins-Minerals) .Marland Kitchen.. 1 By Mouth Qd 5)  Vitamin C 500 Mg  Tabs (Ascorbic Acid) .Marland Kitchen.. 1 By Mouth Qd 6)  Diovan Hct 160-12.5 Mg  Tabs (Valsartan-Hydrochlorothiazide) .Marland Kitchen.. 1 By Mouth Once Daily 7)  Toprol Xl 50 Mg  Tb24 (Metoprolol  Succinate) .Marland Kitchen.. 1 1/2 Tab Once Daily 75 Mg 8)  Plavix 75 Mg Tabs (Clopidogrel Bisulfate) .Marland Kitchen.. 1 By Mouth Once Daily 9)  Adult Aspirin Low Strength 81 Mg Tbdp (Aspirin) .Marland Kitchen.. 1 Daily 10)  Klor-Con 10 10 Meq Cr-Tabs (Potassium Chloride) .Marland Kitchen.. 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 Appendectomy Coronary artery bypass graft  Social History: Reviewed history from 12-14-2007 and no changes required. 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         all otherwise negative per pt -    Physical Exam  General:  alert and overweight-appearing.   Head:  normocephalic and atraumatic.   Eyes:  vision grossly intact, pupils equal, and pupils round.   Ears:  R ear normal and L ear normal.   Nose:  no external deformity and no nasal discharge.   Mouth:  no gingival abnormalities and pharynx pink and moist.   Neck:  supple and no masses.   Lungs:  normal respiratory effort and normal breath sounds.   Heart:  normal rate and regular rhythm.   Abdomen:  soft, non-tender, and normal bowel sounds.   Msk:  no joint tenderness and no joint swelling.   Extremities:  no edema, no erythema  Neurologic:  cranial nerves II-XII intact and strength normal in all extremities.   Skin:  color normal and no rashes.   Psych:  normally interactive, not anxious appearing, and not depressed appearing.      Impression & Recommendations:  Problem # 1:  Preventive Health Care (ICD-V70.0)  Overall doing well, age appropriate education and counseling updated and referral for appropriate preventive services done unless declined, immunizations up to date or declined, diet counseling done if overweight, urged to quit smoking if smokes , most recent labs reviewed and current ordered if appropriate, ecg reviewed or declined (interpretation per ECG scanned in the EMR if done); information regarding Medicare Prevention requirements given if appropriate; speciality referrals updated as appropriate   Orders: TLB-BMP (Basic Metabolic Panel-BMET) (80048-METABOL) TLB-CBC Platelet - w/Differential (85025-CBCD) TLB-Hepatic/Liver Function Pnl (80076-HEPATIC) TLB-Lipid Panel (80061-LIPID) TLB-TSH (Thyroid Stimulating Hormone) (84443-TSH) TLB-PSA (Prostate Specific Antigen) (84153-PSA) TLB-Udip ONLY (81003-UDIP)  Problem # 2:  DIABETES MELLITUS, TYPE II (ICD-250.00)  His updated medication list for this problem includes:    Diovan Hct 160-12.5 Mg Tabs (Valsartan-hydrochlorothiazide) .Marland Kitchen... 1 by mouth once daily    Adult Aspirin Low Strength 81 Mg Tbdp (Aspirin) .Marland KitchenMarland KitchenMarland KitchenMarland Kitchen 1  daily  Labs Reviewed: Creat: 1.1 (06/06/2009)    Reviewed HgBA1c results: 6.3 (12/13/2009)  6.4 (06/06/2009) /stable overall by hx and exam, ok to continue meds/tx as is  Problem # 3:  HYPERTENSION (ICD-401.9)  His updated medication list for this problem includes:    Diovan Hct 160-12.5 Mg Tabs (Valsartan-hydrochlorothiazide) .Marland Kitchen... 1 by mouth once daily    Toprol Xl 50 Mg Tb24 (Metoprolol succinate) .Marland Kitchen... 1 1/2 tab once daily 75 mg  BP today: 110/62 Prior BP: 122/70 (06/27/2009)  Labs Reviewed: K+: 4.3 (06/06/2009) Creat: : 1.1 (06/06/2009)   Chol: 111 (06/06/2009)   HDL: 37.50 (06/06/2009)   LDL: 65 (06/06/2009)   TG: 41.0 (06/06/2009) stable overall by hx and exam, ok to continue meds/tx as is   Complete Medication List: 1)   Levitra 20 Mg Tabs (Vardenafil hcl) .Marland Kitchen.. 1 by mouth every other day as needed 2)  Lipitor 80 Mg Tabs (Atorvastatin calcium) .... 1/2 po once daily 3)  Niaspan 500 Mg Cr-tabs (Niacin (antihyperlipidemic)) .... 3 by mouth once daily 4)  Daily Multi Tabs (Multiple vitamins-minerals) .Marland Kitchen.. 1 by mouth qd 5)  Vitamin C 500 Mg Tabs (Ascorbic acid) .Marland Kitchen.. 1 by mouth qd 6)  Diovan Hct 160-12.5 Mg Tabs (Valsartan-hydrochlorothiazide) .Marland Kitchen.. 1 by mouth once daily 7)  Toprol Xl 50 Mg Tb24 (Metoprolol succinate) .Marland Kitchen.. 1 1/2 tab once daily 75 mg 8)  Plavix 75 Mg Tabs (Clopidogrel bisulfate) .Marland Kitchen.. 1 by mouth once daily 9)  Adult Aspirin Low Strength 81 Mg Tbdp (Aspirin) .Marland Kitchen.. 1 daily 10)  Klor-con 10 10 Meq Cr-tabs (Potassium chloride) .Marland Kitchen.. 1po once daily  Other Orders: Pneumococcal Vaccine ([address]) Admin 1st Vaccine (60454)  Patient Instructions: 1)  Please go to the Lab in the basement for your blood and/or urine tests today 2)  you had the pneumonia shot today 3)  you are given the samples today of the medications we have 4)  Please schedule a follow-up appointment in 6 months, or sooner if needed Prescriptions: LIPITOR 80 MG TABS (ATORVASTATIN CALCIUM) 1po once daily  #90 x 3   Entered and Authorized by:   Corwin Levins MD   Signed by:   Corwin Levins MD on 12/20/2009   Method used:   Print then Give to Patient   RxID:   [id number]    Immunizations Administered:  Pneumonia Vaccine:    Vaccine Type: Pneumovax    Site: left deltoid    Mfr: Merck    Dose: 0.5 ml    Route: IM    Given by: Zella Ball Ewing    Exp. Date: 05/29/2011    Lot #: [id number]    VIS given: 03/01/96 version given Dec 20, 2009.

## 2010-09-05 NOTE — Progress Notes (Signed)
Summary: refill -- pt is out of plavix  Phone Note Call from Patient Call back at Home Phone [phone]   Caller: Mom Call For: Andres Levins MD Summary of Call: Pt needs refill of plavix, pt ran out 12/31 and has not had any since. [facility],[address] Initial call taken by: Verdell Face,  August 06, 2010 4:52 PM    Prescriptions: PLAVIX 75 MG TABS (CLOPIDOGREL BISULFATE) 1 by mouth once daily  #90 x 1   Entered by:   Margaret Pyle, CMA   Authorized by:   Andres Levins MD   Signed by:   Margaret Pyle, CMA on 08/06/2010   Method used:   Electronically to        [facility] [phone]* (retail)       [address]       [place], [place]  [address]       Ph: [phone]       Fax: [phone]   RxID:   [id number]

## 2010-09-05 NOTE — Progress Notes (Signed)
Summary: Rx refill req  Phone Note Refill Request Message from:  Patient on August 20, 2010 1:47 PM  Refills Requested: Medication #1:  LIPITOR 80 MG TABS 1/2 po once daily  Medication #2:  NIASPAN 500 MG CR-TABS 3 by mouth once daily  Medication #3:  DIOVAN HCT 160-12.5 MG  TABS 1 by mouth once daily  Medication #4:  TOPROL XL 50 MG  TB24 1 1/2 tab once daily 75 mg    Prescriptions: PLAVIX 75 MG TABS (CLOPIDOGREL BISULFATE) 1 by mouth once daily  #90 x 0   Entered by:   Margaret Pyle, CMA   Authorized by:   Corwin Levins MD   Signed by:   Margaret Pyle, CMA on 08/20/2010   Method used:   Electronically to        [facility] (retail)       [address]       [place], [place]  [address]       Ph: [phone]       Fax: [phone]   RxID:   [id number] TOPROL XL 50 MG  TB24 (METOPROLOL SUCCINATE) 1 1/2 tab once daily 75 mg  #135 x 0   Entered by:   Margaret Pyle, CMA   Authorized by:   Corwin Levins MD   Signed by:   Margaret Pyle, CMA on 08/20/2010   Method used:   Electronically to        [facility] (retail)       [address]       [place], [place]  [address]       Ph: [phone]       Fax: [phone]   RxID:   [id number] DIOVAN HCT 160-12.5 MG  TABS (VALSARTAN-HYDROCHLOROTHIAZIDE) 1 by mouth once daily  #90 x 0   Entered by:   Margaret Pyle, CMA   Authorized by:   Corwin Levins MD   Signed by:   Margaret Pyle, CMA on 08/20/2010   Method used:   Electronically to        [facility] (retail)       [address]       [place], [place]  [address]       Ph: [phone]       Fax: [phone]   RxID:   [id number] NIASPAN 500 MG CR-TABS (NIACIN (ANTIHYPERLIPIDEMIC)) 3 by mouth once daily  #270 x 0   Entered by:   Margaret Pyle, CMA   Authorized by:   Corwin Levins MD   Signed by:   Margaret Pyle, CMA on 08/20/2010   Method used:   Electronically to        [facility] (retail)       [address]       [place], [place]  [address]       Ph: [phone]       Fax: [phone]   RxID:   [id number] LIPITOR 80 MG TABS (ATORVASTATIN CALCIUM) 1/2 po once daily  #45 x 0   Entered by:   Margaret Pyle, CMA   Authorized by:   Corwin Levins MD   Signed by:   Margaret Pyle, CMA on 08/20/2010   Method used:   Electronically to  [facility] (retail)       [address]       [place], [place]  [address]       Ph: [phone]       Fax: [phone]   RxID:   [id number]

## 2010-09-05 NOTE — Progress Notes (Signed)
Summary: medication change  Phone Note From Pharmacy   Caller: [facility] Summary of Call: Patient would like to change to Metoprolol Tartrate instead of generic Toprol XL. The metoprolol is on the $4 list. Initial call taken by: Robin Ewing CMA Duncan Dull),  August 26, 2010 10:06 AM  Follow-up for Phone Call        ok to change to metoprolol 25 mg - 1 and 1/2 by mouth two times a day  - to robin to handle Follow-up by: Corwin Levins MD,  August 26, 2010 12:28 PM    New/Updated Medications: METOPROLOL TARTRATE 25 MG TABS (METOPROLOL TARTRATE) 1 and 1/2 two times a day Prescriptions: METOPROLOL TARTRATE 25 MG TABS (METOPROLOL TARTRATE) 1 and 1/2 two times a day  #90 x 11   Entered by:   Zella Ball Ewing CMA (AAMA)   Authorized by:   Corwin Levins MD   Signed by:   Scharlene Gloss CMA (AAMA) on 08/26/2010   Method used:   Faxed to ...       [facility] (retail)       [address]       [place], [place]  [address]       Ph: [phone]       Fax: [phone]   RxID:   [id number]

## 2010-09-23 ENCOUNTER — Encounter: Payer: Self-pay | Admitting: Internal Medicine

## 2010-09-26 ENCOUNTER — Other Ambulatory Visit: Payer: Medicare Other

## 2010-09-26 ENCOUNTER — Encounter (INDEPENDENT_AMBULATORY_CARE_PROVIDER_SITE_OTHER): Payer: Self-pay | Admitting: *Deleted

## 2010-09-26 ENCOUNTER — Other Ambulatory Visit: Payer: Self-pay | Admitting: Internal Medicine

## 2010-09-26 DIAGNOSIS — IMO0001 Reserved for inherently not codable concepts without codable children: Secondary | ICD-10-CM

## 2010-09-26 DIAGNOSIS — E1165 Type 2 diabetes mellitus with hyperglycemia: Secondary | ICD-10-CM

## 2010-09-26 LAB — BASIC METABOLIC PANEL
BUN: 10 mg/dL (ref 6–23)
CO2: 33 mEq/L — ABNORMAL HIGH (ref 19–32)
Calcium: 9.1 mg/dL (ref 8.4–10.5)
Creatinine, Ser: 0.9 mg/dL (ref 0.4–1.5)
GFR: 105.36 mL/min (ref >60.00)
Glucose, Bld: 101 mg/dL — ABNORMAL HIGH (ref 70–99)
Sodium: 142 mEq/L (ref 135–145)

## 2010-09-26 LAB — LIPID PANEL
HDL: 39.9 mg/dL (ref >39.00)
Total CHOL/HDL Ratio: 3

## 2010-09-26 LAB — HEMOGLOBIN A1C: Hgb A1c MFr Bld: 6.9 % — ABNORMAL HIGH (ref 4.6–6.5)

## 2010-10-01 NOTE — Letter (Signed)
Summary: Medical clearance/[facility]  Medical clearance/[facility]   Imported By: Lester Palmer 09/25/2010 07:32:12  _____________________________________________________________________  External Attachment:    Type:   Image     Comment:   External Document

## 2010-10-03 ENCOUNTER — Ambulatory Visit (INDEPENDENT_AMBULATORY_CARE_PROVIDER_SITE_OTHER): Payer: Medicare Other | Admitting: Internal Medicine

## 2010-10-03 ENCOUNTER — Encounter: Payer: Self-pay | Admitting: Internal Medicine

## 2010-10-03 DIAGNOSIS — E785 Hyperlipidemia, unspecified: Secondary | ICD-10-CM

## 2010-10-03 DIAGNOSIS — R413 Other amnesia: Secondary | ICD-10-CM | POA: Insufficient documentation

## 2010-10-03 DIAGNOSIS — E119 Type 2 diabetes mellitus without complications: Secondary | ICD-10-CM

## 2010-10-03 DIAGNOSIS — G3184 Mild cognitive impairment, so stated: Secondary | ICD-10-CM | POA: Insufficient documentation

## 2010-10-03 DIAGNOSIS — Z8679 Personal history of other diseases of the circulatory system: Secondary | ICD-10-CM | POA: Insufficient documentation

## 2010-10-03 DIAGNOSIS — I10 Essential (primary) hypertension: Secondary | ICD-10-CM

## 2010-10-10 NOTE — Assessment & Plan Note (Signed)
Summary: 6 MO ROV /NWS #   Vital Signs:  Patient profile:   70 year old male Height:      66 inches Weight:      234.25 pounds BMI:     37.95 O2 Sat:      97 % on Room air Temp:     98.4 degrees F oral Pulse rate:   49 / minute BP sitting:   106 / 70  (left arm) Cuff size:   large  Vitals Entered By: Zella Ball Ewing CMA (AAMA) (October 03, 2010 10:17 AM)  O2 Flow:  Room air CC: 6 month ROV/RE   CC:  6 month ROV/RE.  History of Present Illness: here to f/u;  overall doing ok;  Pt denies CP, worsening sob, doe, wheezing, orthopnea, pnd, worsening LE edema, palps, dizziness or syncope  Pt denies new neuro symptoms such as headache, facial or extremity weakness  Pt denies polydipsia, polyuria, or low sugar symptoms such as shakiness improved with eating.  Overall good compliance with meds, trying to follow low chol, DM diet, wt stable, little excercise however  CBG's in the lower 100's.  Denies worsening depressive symptoms, suicidal ideation, or panic.   Overall good compliance with meds, and good tolerability.   No fever, wt loss, night sweats, loss of appetite or other constitutional symptoms  Due for stress test next wk per Dr Tresa Endo, with OV with Dr Tresa Endo after that.   Need med refills, c/o numbner of pills he has to take.   Has had some increased forgetfulness recently, hard to "focus" though no increased anxiety, seems like he doesnt pay attentention enough, and states he thinks overall memory is "about the same" over the past yr, though to me has more cognitive slowing today.    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) 2)  Memory Loss  (ICD-780.93) 3)  Cerebrovascular Accident, Hx of  (ICD-V12.50) 4)  Abrasion, Face  (ICD-910.0) 5)  Preventive Health Care  (ICD-V70.0) 6)  Special Screening Malignant Neoplasm of Prostate  (ICD-V76.44) 7)  Rash-nonvesicular  (ICD-782.1) 8)  Transient Ischemic Attack  (ICD-435.9) 9)  Allergic Rhinitis   (ICD-477.9) 10)  Low Back Pain  (ICD-724.2) 11)  Nephrolithiasis, Hx of  (ICD-V13.01) 12)  Diabetes Mellitus, Type II  (ICD-250.00) 13)  Preventive Health Care  (ICD-V70.0) 14)  Diverticulosis, Colon  (ICD-562.10) 15)  Carpal Tunnel Syndrome, Bilateral  (ICD-354.0) 16)  Cataract Nos  (ICD-366.9) 17)  Benign Prostatic Hypertrophy  (ICD-600.00) 18)  Morbid Obesity  (ICD-278.01) 19)  Hypertension  (ICD-401.9) 20)  Hyperlipidemia  (ICD-272.4) 21)  Coronary Artery Disease  (ICD-414.00)  Medications Prior to Update: 1)  Levitra 20 Mg Tabs (Vardenafil Hcl) .Marland Kitchen.. 1 By Mouth Every Other Day As Needed 2)  Lipitor 80 Mg Tabs (Atorvastatin Calcium) .... 1/2 Po Once Daily 3)  Niaspan 500 Mg Cr-Tabs (Niacin (Antihyperlipidemic)) .... 3 By Mouth Once Daily 4)  Daily Multi   Tabs (Multiple Vitamins-Minerals) .Marland Kitchen.. 1 By Mouth Qd 5)  Vitamin C 500 Mg  Tabs (Ascorbic Acid) .Marland Kitchen.. 1 By Mouth Qd 6)  Diovan Hct 160-12.5 Mg  Tabs (Valsartan-Hydrochlorothiazide) .Marland Kitchen.. 1 By Mouth Once Daily 7)  Toprol Xl 50 Mg  Tb24 (Metoprolol Succinate) .Marland Kitchen.. 1 1/2 Tab Once Daily 75 Mg 8)  Plavix 75 Mg Tabs (Clopidogrel Bisulfate) .Marland Kitchen.. 1 By Mouth Once Daily 9)  Adult Aspirin Low Strength 81 Mg Tbdp (Aspirin) .Marland Kitchen.. 1 Daily 10)  Folic Acid 400 Mcg Tabs (  Folic Acid) .Marland Kitchen.. 1 By Mouth Once Daily 11)  Metoprolol Tartrate 25 Mg Tabs (Metoprolol Tartrate) .Marland Kitchen.. 1 and 1/2 Two Times A Day  Current Medications (verified): 1)  Levitra 20 Mg Tabs (Vardenafil Hcl) .Marland Kitchen.. 1 By Mouth Every Other Day As Needed 2)  Atorvastatin Calcium 40 Mg Tabs (Atorvastatin Calcium) .Marland Kitchen.. 1 By Mouth Once Daily 3)  Niacin Cr 750 Mg Cr-Tabs (Niacin) .Marland Kitchen.. 1 By Mouth Two Times A Day 4)  Daily Multi   Tabs (Multiple Vitamins-Minerals) .Marland Kitchen.. 1 By Mouth Qd 5)  Vitamin C 500 Mg  Tabs (Ascorbic Acid) .Marland Kitchen.. 1 By Mouth Qd 6)  Diovan Hct 160-12.5 Mg  Tabs (Valsartan-Hydrochlorothiazide) .Marland Kitchen.. 1 By Mouth Once Daily 7)  Toprol Xl 50 Mg  Tb24 (Metoprolol Succinate) .Marland Kitchen.. 1 1/2 Tab  Once Daily 75 Mg 8)  Plavix 75 Mg Tabs (Clopidogrel Bisulfate) .Marland Kitchen.. 1 By Mouth Once Daily 9)  Adult Aspirin Low Strength 81 Mg Tbdp (Aspirin) .Marland Kitchen.. 1 Daily 10)  Folic Acid 400 Mcg Tabs (Folic Acid) .Marland Kitchen.. 1 By Mouth Once Daily 11)  Metoprolol Tartrate 25 Mg Tabs (Metoprolol Tartrate) .Marland Kitchen.. 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   Head:  normocephalic and atraumatic.   Eyes:  vision grossly intact, pupils equal, and pupils round.   Ears:  R ear normal and L ear normal.   Nose:  no external deformity and no nasal discharge.   Mouth:  no gingival abnormalities and pharynx pink and moist.   Neck:  supple and no masses.   Lungs:  normal respiratory effort and normal breath sounds.   Heart:  normal rate and regular rhythm.   Msk:  no joint tenderness and no joint swelling.   Extremities:  no edema, no erythema  Neurologic:  cranial nerves II-XII intact and strength normal in all extremities.     Impression & Recommendations:  Problem # 1:  DIABETES MELLITUS, TYPE II (ICD-250.00)  His updated medication list for this problem includes:    Diovan Hct 160-12.5  Mg Tabs (Valsartan-hydrochlorothiazide) .Marland Kitchen... 1 by mouth once daily    Adult Aspirin Low Strength 81 Mg Tbdp (Aspirin) .Marland Kitchen... 1 daily  Labs Reviewed: Creat: 0.9 (09/26/2010)    Reviewed HgBA1c results: 6.9 (09/26/2010)  6.3 (12/13/2009) stable overall by hx and exam, ok to continue meds/tx as is . Pt to cont DM diet, excercise, wt control efforts; to check labs next visit as well  Problem # 2:  HYPERTENSION (ICD-401.9)  His updated medication list for this problem includes:    Diovan Hct 160-12.5 Mg Tabs (Valsartan-hydrochlorothiazide) .Marland Kitchen... 1 by mouth once daily    Toprol Xl 50 Mg Tb24 (Metoprolol succinate) .Marland Kitchen... 1 1/2 tab once daily 75 mg    Metoprolol Tartrate 25 Mg Tabs (Metoprolol  tartrate) .Marland Kitchen... 1 and 1/2 two times a day  BP today: 106/70 Prior BP: 120/62 (03/28/2010)  Labs Reviewed: K+: 4.1 (09/26/2010) Creat: : 0.9 (09/26/2010)   Chol: 105 (09/26/2010)   HDL: 39.90 (09/26/2010)   LDL: 58 (09/26/2010)   TG: 37.0 (09/26/2010) stable overall by hx and exam, ok to continue meds/tx as is   Problem # 3:  HYPERLIPIDEMIA (ICD-272.4)  His updated medication list for this problem includes:    Atorvastatin Calcium 40 Mg Tabs (Atorvastatin calcium) .Marland Kitchen... 1 by mouth once daily    Niacin Cr 750 Mg Cr-tabs (Niacin) .Marland Kitchen... 1 by mouth two times a day  Labs Reviewed: SGOT: 45 (12/20/2009)   SGPT: 43 (12/20/2009)   HDL:39.90 (09/26/2010), 47.90 (12/20/2009)  LDL:58 (09/26/2010), 61 (12/20/2009)  Chol:105 (09/26/2010), 119 (12/20/2009)  Trig:37.0 (09/26/2010), 49.0 (12/20/2009) stable overall by hx and exam, ok to continue meds/tx as is Pt to continue diet efforts, good med tolerance; to check labs - goal LDL less than 70   Problem # 4:  MEMORY LOSS (ICD-780.93)  seems mild worse, ? worsening stroke vs other dementia - will refer back to Dr Bradd Burner - last seen by our records july 2009 S/P cva   Orders: Neurology Referral (Neuro)  Complete Medication List: 1)  Levitra 20 Mg Tabs  (Vardenafil hcl) .Marland Kitchen.. 1 by mouth every other day as needed 2)  Atorvastatin Calcium 40 Mg Tabs (Atorvastatin calcium) .Marland Kitchen.. 1 by mouth once daily 3)  Niacin Cr 750 Mg Cr-tabs (Niacin) .Marland Kitchen.. 1 by mouth two times a day 4)  Daily Multi Tabs (Multiple vitamins-minerals) .Marland Kitchen.. 1 by mouth qd 5)  Vitamin C 500 Mg Tabs (Ascorbic acid) .Marland Kitchen.. 1 by mouth qd 6)  Diovan Hct 160-12.5 Mg Tabs (Valsartan-hydrochlorothiazide) .Marland Kitchen.. 1 by mouth once daily 7)  Toprol Xl 50 Mg Tb24 (Metoprolol succinate) .Marland Kitchen.. 1 1/2 tab once daily 75 mg 8)  Plavix 75 Mg Tabs (Clopidogrel bisulfate) .Marland Kitchen.. 1 by mouth once daily 9)  Adult Aspirin Low Strength 81 Mg Tbdp (Aspirin) .Marland Kitchen.. 1 daily 10)  Folic Acid 400 Mcg Tabs (Folic acid) .Marland Kitchen.. 1 by mouth once daily 11)  Metoprolol Tartrate 25 Mg Tabs (Metoprolol tartrate) .Marland Kitchen.. 1 and 1/2 two times a day  Patient Instructions: 1)  Continue all previous medications as before this visit  2)  You will be contacted about the referral(s) to: Neurology about the memory and thinking 3)  Your niaspan was changed from the 3 pills per day to 2 of the 750 mg pills 4)  Please schedule a follow-up appointment in 6 months for CPX with labs and: 5)  HbgA1C prior to visit, ICD-9: 250.02 6)  Urine Microalbumin prior to visit, ICD-9: Prescriptions: NIACIN CR 750 MG CR-TABS (NIACIN) 1 by mouth two times a day  #180 x 3   Entered and Authorized by:   Corwin Levins MD   Signed by:   Corwin Levins MD on 10/03/2010   Method used:   Print then Give to Patient   RxID:   [id number] ATORVASTATIN CALCIUM 40 MG TABS (ATORVASTATIN CALCIUM) 1 by mouth once daily  #90 x 3   Entered and Authorized by:   Corwin Levins MD   Signed by:   Corwin Levins MD on 10/03/2010   Method used:   Print then Give to Patient   RxID:   [id number]    Orders Added: 1)  Neurology Referral [Neuro] 2)  Est. Patient Level IV [84[address]]

## 2010-10-16 ENCOUNTER — Encounter: Payer: Self-pay | Admitting: Internal Medicine

## 2010-10-29 ENCOUNTER — Telehealth: Payer: Self-pay

## 2010-10-29 MED ORDER — LOSARTAN POTASSIUM-HCTZ 100-12.5 MG PO TABS: 1.0000 | ORAL_TABLET | Freq: Every day | ORAL | Status: DC

## 2010-10-29 NOTE — Telephone Encounter (Signed)
Pt called requesting a cheaper generic alternative to Diovan to [address]

## 2010-10-29 NOTE — Telephone Encounter (Signed)
To robin   OK to change diovanhct 160/12.5  To   Losartan 100/12.5 mg - 1 per day  Robin to handle per protocol  Please notify pt;  To check BP's at home if he can and call in 2 wks with results

## 2010-11-07 ENCOUNTER — Other Ambulatory Visit: Payer: Self-pay | Admitting: Internal Medicine

## 2010-11-22 ENCOUNTER — Other Ambulatory Visit: Payer: Self-pay | Admitting: Internal Medicine

## 2010-11-26 ENCOUNTER — Telehealth: Payer: Self-pay

## 2010-11-26 NOTE — Telephone Encounter (Signed)
Niacin cr bid 750

## 2010-11-26 NOTE — Telephone Encounter (Signed)
Pharmacy requesting clarification on rx. Do you want Niaspan 750 mg BID or niacin time release 750 mg?

## 2010-11-26 NOTE — Telephone Encounter (Signed)
Pharmacy informed of MD's instructions.

## 2011-04-09 ENCOUNTER — Telehealth: Payer: Self-pay | Admitting: *Deleted

## 2011-04-10 ENCOUNTER — Other Ambulatory Visit (INDEPENDENT_AMBULATORY_CARE_PROVIDER_SITE_OTHER): Payer: Medicare Other

## 2011-04-10 ENCOUNTER — Telehealth: Payer: Self-pay

## 2011-04-10 DIAGNOSIS — I10 Essential (primary) hypertension: Secondary | ICD-10-CM

## 2011-04-10 DIAGNOSIS — Z125 Encounter for screening for malignant neoplasm of prostate: Secondary | ICD-10-CM

## 2011-04-10 DIAGNOSIS — Z0000 Encounter for general adult medical examination without abnormal findings: Secondary | ICD-10-CM

## 2011-04-10 DIAGNOSIS — E119 Type 2 diabetes mellitus without complications: Secondary | ICD-10-CM

## 2011-04-10 DIAGNOSIS — Z1289 Encounter for screening for malignant neoplasm of other sites: Secondary | ICD-10-CM

## 2011-04-10 DIAGNOSIS — E785 Hyperlipidemia, unspecified: Secondary | ICD-10-CM

## 2011-04-10 LAB — LIPID PANEL
HDL: 42.4 mg/dL (ref >39.00)
LDL Cholesterol: 65 mg/dL (ref 0–99)
Total CHOL/HDL Ratio: 3
VLDL: 5 mg/dL (ref 0.0–40.0)

## 2011-04-10 LAB — CBC WITH DIFFERENTIAL/PLATELET
Basophils Absolute: 0 10*3/uL (ref 0.0–0.1)
Eosinophils Relative: 2.6 % (ref 0.0–5.0)
HCT: 38.2 % — ABNORMAL LOW (ref 39.0–52.0)
Hemoglobin: 12.7 g/dL — ABNORMAL LOW (ref 13.0–17.0)
Lymphocytes Relative: 48.4 % — ABNORMAL HIGH (ref 12.0–46.0)
Monocytes Relative: 9.5 % (ref 3.0–12.0)
Neutro Abs: 1.5 10*3/uL (ref 1.4–7.7)
RDW: 14 % (ref 11.5–14.6)
WBC: 3.8 10*3/uL — ABNORMAL LOW (ref 4.5–10.5)

## 2011-04-10 LAB — URINALYSIS, ROUTINE W REFLEX MICROSCOPIC
Bilirubin Urine: NEGATIVE
Hgb urine dipstick: NEGATIVE
Total Protein, Urine: NEGATIVE
Urine Glucose: NEGATIVE

## 2011-04-10 LAB — BASIC METABOLIC PANEL
CO2: 31 mEq/L (ref 19–32)
Chloride: 100 mEq/L (ref 96–112)
Creatinine, Ser: 1.1 mg/dL (ref 0.4–1.5)
Glucose, Bld: 113 mg/dL — ABNORMAL HIGH (ref 70–99)
Sodium: 137 mEq/L (ref 135–145)

## 2011-04-10 LAB — HEPATIC FUNCTION PANEL
ALT: 25 U/L (ref 0–53)
Albumin: 3.9 g/dL (ref 3.5–5.2)
Alkaline Phosphatase: 61 U/L (ref 39–117)
Bilirubin, Direct: 0.2 mg/dL (ref 0.0–0.3)
Total Protein: 6 g/dL (ref 6.0–8.3)

## 2011-04-10 LAB — PSA: PSA: 0.39 ng/mL (ref 0.10–4.00)

## 2011-04-10 NOTE — Telephone Encounter (Signed)
Put order in for lab. 

## 2011-04-11 ENCOUNTER — Encounter: Payer: Self-pay | Admitting: Internal Medicine

## 2011-04-11 DIAGNOSIS — Z0000 Encounter for general adult medical examination without abnormal findings: Secondary | ICD-10-CM | POA: Insufficient documentation

## 2011-04-16 ENCOUNTER — Ambulatory Visit (INDEPENDENT_AMBULATORY_CARE_PROVIDER_SITE_OTHER): Payer: Medicare Other | Admitting: Internal Medicine

## 2011-04-16 ENCOUNTER — Encounter: Payer: Self-pay | Admitting: Internal Medicine

## 2011-04-16 VITALS — BP 102/60 | HR 54 | Temp 98.0°F | Ht 66.0 in | Wt 231.1 lb

## 2011-04-16 DIAGNOSIS — N400 Enlarged prostate without lower urinary tract symptoms: Secondary | ICD-10-CM

## 2011-04-16 DIAGNOSIS — Z23 Encounter for immunization: Secondary | ICD-10-CM

## 2011-04-16 DIAGNOSIS — E119 Type 2 diabetes mellitus without complications: Secondary | ICD-10-CM

## 2011-04-16 DIAGNOSIS — Z0000 Encounter for general adult medical examination without abnormal findings: Secondary | ICD-10-CM

## 2011-04-16 MED ORDER — METOPROLOL TARTRATE 25 MG PO TABS: ORAL_TABLET | ORAL | Status: DC

## 2011-04-16 MED ORDER — NIACIN ER (ANTIHYPERLIPIDEMIC) 750 MG PO TBCR: 750.0000 mg | EXTENDED_RELEASE_TABLET | Freq: Every day | ORAL | Status: DC

## 2011-04-16 MED ORDER — ATORVASTATIN CALCIUM 40 MG PO TABS: 40.0000 mg | ORAL_TABLET | Freq: Every day | ORAL | Status: DC

## 2011-04-16 MED ORDER — CLOPIDOGREL BISULFATE 75 MG PO TABS: 75.0000 mg | ORAL_TABLET | Freq: Every day | ORAL | Status: DC

## 2011-04-16 MED ORDER — NIACIN ER (ANTIHYPERLIPIDEMIC) 500 MG PO TBCR: 500.0000 mg | EXTENDED_RELEASE_TABLET | Freq: Every day | ORAL | Status: DC

## 2011-04-16 MED ORDER — FOLIC ACID 400 MCG PO TABS: 400.0000 ug | ORAL_TABLET | Freq: Every day | ORAL | Status: DC

## 2011-04-16 MED ORDER — LOSARTAN POTASSIUM-HCTZ 100-12.5 MG PO TABS: 1.0000 | ORAL_TABLET | Freq: Every day | ORAL | Status: DC

## 2011-04-16 NOTE — Patient Instructions (Addendum)
You had the flu shot today Please call if you would like to try the generic Flomax for the prostate Continue all other medications as before Please return in 1 year for your yearly visit, or sooner if needed, with Lab testing done 3-5 days before

## 2011-04-16 NOTE — Progress Notes (Signed)
Subjective:    Patient ID: Andres [name], male    DOB: [birth year], 70 y.o.   MRN: [id number]  HPI  Here for wellness and f/u;  Overall doing ok;  Pt denies CP, worsening SOB, DOE, wheezing, orthopnea, PND, worsening LE edema, palpitations, dizziness or syncope.  Pt denies neurological change such as new Headache, facial or extremity weakness.  Pt denies polydipsia, polyuria, or low sugar symptoms. Pt states overall good compliance with treatment and medications, good tolerability, and trying to follow lower cholesterol diet.  Pt denies worsening depressive symptoms, suicidal ideation or panic. No fever, wt loss, night sweats, loss of appetite, or other constitutional symptoms.  Pt states good ability with ADL's, low fall risk, home safety reviewed and adequate, no significant changes in hearing or vision, and occasionally active with exercise.  Does  C/o some urinary freq more than 10 yrs ago, and nocturia 2 times per night.  Sees Dr Kelly/card with stress test about every 18 mo Past Medical History  Diagnosis Date  . Coronary artery disease     Dr. Tresa Endo  . Hyperlipidemia   . Hypertension   . Hepatitis   . Obesity, morbid   . Benign prostatic hypertrophy   . Cataracts, bilateral   . Carpal tunnel syndrome, bilateral   . Diverticulosis of colon   . Diabetes mellitus type II   . Nephrolithiasis   . Low back pain   . ALLERGIC RHINITIS   . Head mass    Past Surgical History  Procedure Date  . Appendectomy   . Coronary artery bypass graft     reports that he has quit smoking. He does not have any smokeless tobacco history on file. He reports that he does not drink alcohol or use illicit drugs. family history includes Cancer in his brother; Dementia in his father; Diabetes in his other; and Heart attack in his brother and mother. No Known Allergies Current Outpatient Prescriptions on File Prior to Visit  Medication Sig Dispense Refill  . Ascorbic Acid (VITAMIN C) 500 MG tablet Take 500  mg by mouth daily.        . Aspirin (ADULT ASPIRIN LOW STRENGTH) 81 MG EC tablet Take 81 mg by mouth daily.        Marland Kitchen atorvastatin (LIPITOR) 80 MG tablet Take 80 mg by mouth. 1/2 by mouth once daily       . folic acid (FOLVITE) 400 MCG tablet Take 400 mcg by mouth daily.        Marland Kitchen LEVITRA 20 MG tablet TAKE ONE TABLET BY MOUTH EVERY OTHER DAY AS NEEDED  5 each  11  . losartan-hydrochlorothiazide (HYZAAR) 100-12.5 MG per tablet Take 1 tablet by mouth daily.  30 tablet  11  . metoprolol (TOPROL-XL) 50 MG 24 hr tablet Take 50 mg by mouth. 1 and 1/2 once daily       . Multiple Vitamin (MULTIVITAMIN) capsule Take 1 capsule by mouth daily.        . niacin (NIASPAN) 500 MG CR tablet 500 mg 3 (three) times daily.        Marland Kitchen PLAVIX 75 MG tablet TAKE ONE TABLET BY MOUTH EVERY DAY  90 each  3  . valsartan-hydrochlorothiazide (DIOVAN-HCT) 160-12.5 MG per tablet Take 1 tablet by mouth daily.         Review of Systems Review of Systems  Constitutional: Negative for diaphoresis, activity change, appetite change and unexpected weight change.  HENT: Negative for hearing loss, ear pain, facial  swelling, mouth sores and neck stiffness.   Eyes: Negative for pain, redness and visual disturbance.  Respiratory: Negative for shortness of breath and wheezing.   Cardiovascular: Negative for chest pain and palpitations.  Gastrointestinal: Negative for diarrhea, blood in stool, abdominal distention and rectal pain.  Genitourinary: Negative for hematuria, flank pain and decreased urine volume.  Musculoskeletal: Negative for myalgias and joint swelling.  Skin: Negative for color change and wound.  Neurological: Negative for syncope and numbness.  Hematological: Negative for adenopathy.  Psychiatric/Behavioral: Negative for hallucinations, self-injury, decreased concentration and agitation.      Objective:   Physical Exam BP 102/60  Pulse 54  Temp(Src) 98 F (36.7 C) (Oral)  Ht 5\' 6"  (1.676 m)  Wt 231 lb 2 oz  (104.838 kg)  BMI 37.30 kg/m2  SpO2 97% Physical Exam  VS noted, obese Constitutional: Pt is oriented to person, place, and time. Appears well-developed and well-nourished.  HENT:  Head: Normocephalic and atraumatic.  Right Ear: External ear normal.  Left Ear: External ear normal.  Nose: Nose normal.  Mouth/Throat: Oropharynx is clear and moist.  Eyes: Conjunctivae and EOM are normal. Pupils are equal, round, and reactive to light.  Neck: Normal range of motion. Neck supple. No JVD present. No tracheal deviation present.  Cardiovascular: Normal rate, regular rhythm, normal heart sounds and intact distal pulses.   Pulmonary/Chest: Effort normal and breath sounds normal.  Abdominal: Soft. Bowel sounds are normal. There is no tenderness.  Musculoskeletal: Normal range of motion. Exhibits no edema.  Lymphadenopathy:  Has no cervical adenopathy.  Neurological: Pt is alert and oriented to person, place, and time. Pt has normal reflexes. No cranial nerve deficit.  Skin: Skin is warm and dry. No rash noted.  Psychiatric:  Has  normal mood and affect. Behavior is normal.  DRE deferred per pt       Assessment & Plan:

## 2011-04-16 NOTE — Assessment & Plan Note (Signed)
Mild symptoms, to consider flomax but declines for now

## 2011-04-16 NOTE — Assessment & Plan Note (Signed)

## 2012-02-11 ENCOUNTER — Other Ambulatory Visit: Payer: Self-pay

## 2012-02-11 MED ORDER — METOPROLOL TARTRATE 25 MG PO TABS: ORAL_TABLET | ORAL | Status: DC

## 2012-03-05 ENCOUNTER — Other Ambulatory Visit: Payer: Self-pay | Admitting: *Deleted

## 2012-03-05 MED ORDER — CLOPIDOGREL BISULFATE 75 MG PO TABS: 75.0000 mg | ORAL_TABLET | Freq: Every day | ORAL | Status: DC

## 2012-03-05 MED ORDER — LOSARTAN POTASSIUM-HCTZ 100-12.5 MG PO TABS: 1.0000 | ORAL_TABLET | Freq: Every day | ORAL | Status: DC

## 2012-03-05 NOTE — Telephone Encounter (Signed)
Med refill losartan and plavix to [facility] on [address]

## 2012-03-15 ENCOUNTER — Other Ambulatory Visit: Payer: Self-pay | Admitting: Cardiology

## 2012-03-15 DIAGNOSIS — I6529 Occlusion and stenosis of unspecified carotid artery: Secondary | ICD-10-CM

## 2012-03-18 ENCOUNTER — Encounter (INDEPENDENT_AMBULATORY_CARE_PROVIDER_SITE_OTHER): Payer: Medicare Other

## 2012-03-18 DIAGNOSIS — I6529 Occlusion and stenosis of unspecified carotid artery: Secondary | ICD-10-CM

## 2012-03-19 ENCOUNTER — Encounter: Payer: Self-pay | Admitting: Internal Medicine

## 2012-04-14 ENCOUNTER — Other Ambulatory Visit (INDEPENDENT_AMBULATORY_CARE_PROVIDER_SITE_OTHER): Payer: Medicare Other

## 2012-04-14 DIAGNOSIS — Z125 Encounter for screening for malignant neoplasm of prostate: Secondary | ICD-10-CM

## 2012-04-14 DIAGNOSIS — Z79899 Other long term (current) drug therapy: Secondary | ICD-10-CM

## 2012-04-14 DIAGNOSIS — Z0000 Encounter for general adult medical examination without abnormal findings: Secondary | ICD-10-CM

## 2012-04-14 DIAGNOSIS — E119 Type 2 diabetes mellitus without complications: Secondary | ICD-10-CM

## 2012-04-14 LAB — BASIC METABOLIC PANEL
CO2: 30 mEq/L (ref 19–32)
Chloride: 104 mEq/L (ref 96–112)
Potassium: 4.4 mEq/L (ref 3.5–5.1)

## 2012-04-14 LAB — URINALYSIS, ROUTINE W REFLEX MICROSCOPIC
Ketones, ur: NEGATIVE
Leukocytes, UA: NEGATIVE
Specific Gravity, Urine: 1.015 (ref 1.000–1.030)
Urine Glucose: NEGATIVE
Urobilinogen, UA: 0.2 (ref 0.0–1.0)
pH: 6 (ref 5.0–8.0)

## 2012-04-14 LAB — LIPID PANEL
Cholesterol: 129 mg/dL (ref 0–200)
HDL: 41.8 mg/dL (ref >39.00)
LDL Cholesterol: 74 mg/dL (ref 0–99)
Total CHOL/HDL Ratio: 3
Triglycerides: 64 mg/dL (ref 0.0–149.0)
VLDL: 12.8 mg/dL (ref 0.0–40.0)

## 2012-04-14 LAB — CBC WITH DIFFERENTIAL/PLATELET
Basophils Absolute: 0 10*3/uL (ref 0.0–0.1)
Eosinophils Absolute: 0.1 10*3/uL (ref 0.0–0.7)
Lymphocytes Relative: 43.5 % (ref 12.0–46.0)
MCHC: 32.7 g/dL (ref 30.0–36.0)
Monocytes Relative: 8.3 % (ref 3.0–12.0)
Neutro Abs: 1.9 10*3/uL (ref 1.4–7.7)
Neutrophils Relative %: 45.4 % (ref 43.0–77.0)
Platelets: 161 10*3/uL (ref 150.0–400.0)
RDW: 14.3 % (ref 11.5–14.6)

## 2012-04-14 LAB — TSH: TSH: 0.64 u[IU]/mL (ref 0.35–5.50)

## 2012-04-14 LAB — HEPATIC FUNCTION PANEL
Albumin: 3.9 g/dL (ref 3.5–5.2)
Alkaline Phosphatase: 68 U/L (ref 39–117)
Total Protein: 6.5 g/dL (ref 6.0–8.3)

## 2012-04-14 LAB — HEMOGLOBIN A1C: Hgb A1c MFr Bld: 6.3 % (ref 4.6–6.5)

## 2012-04-21 ENCOUNTER — Ambulatory Visit (INDEPENDENT_AMBULATORY_CARE_PROVIDER_SITE_OTHER): Payer: Medicare Other | Admitting: Internal Medicine

## 2012-04-21 ENCOUNTER — Encounter: Payer: Self-pay | Admitting: Internal Medicine

## 2012-04-21 VITALS — BP 122/62 | HR 52 | Temp 97.0°F | Ht 66.0 in | Wt 231.1 lb

## 2012-04-21 DIAGNOSIS — Z0000 Encounter for general adult medical examination without abnormal findings: Secondary | ICD-10-CM

## 2012-04-21 DIAGNOSIS — E119 Type 2 diabetes mellitus without complications: Secondary | ICD-10-CM

## 2012-04-21 DIAGNOSIS — N400 Enlarged prostate without lower urinary tract symptoms: Secondary | ICD-10-CM

## 2012-04-21 DIAGNOSIS — Z23 Encounter for immunization: Secondary | ICD-10-CM

## 2012-04-21 MED ORDER — TAMSULOSIN HCL 0.4 MG PO CAPS: 0.4000 mg | ORAL_CAPSULE | Freq: Every day | ORAL | Status: DC

## 2012-04-21 NOTE — Patient Instructions (Addendum)
You had the flu shot today Take all new medications as prescribed - the flomax Continue all other medications as before Please have the pharmacy call with any refills you may need. Please keep your appointments with your specialists as you have planned - Dr Kelly/cardiology You are otherwise up to date with prevention Please return in 1 year for your yearly visit, or sooner if needed, with Lab testing done 3-5 days before Please remember to sign up for My Chart at your earliest convenience, as this will be important to you in the future with finding out test results.

## 2012-04-21 NOTE — Progress Notes (Signed)
Subjective:    Patient ID: Andres [name], male    DOB: 09-25-1940, 71 y.o.   MRN: [id number]  HPI  Here for wellness and f/u;  Overall doing ok;  Pt denies CP, worsening SOB, DOE, wheezing, orthopnea, PND, worsening LE edema, palpitations, dizziness or syncope.  Pt denies neurological change such as new Headache, facial or extremity weakness.  Pt denies polydipsia, polyuria, or low sugar symptoms. Pt states overall good compliance with treatment and medications, good tolerability, and trying to follow lower cholesterol diet.  Pt denies worsening depressive symptoms, suicidal ideation or panic. No fever, wt loss, night sweats, loss of appetite, or other constitutional symptoms.  Pt states good ability with ADL's, low fall risk, home safety reviewed and adequate, no significant changes in hearing or vision, and occasionally active with exercise.[facility] mostly [facility], some light wts.  Past Medical History  Diagnosis Date  . Coronary artery disease     Dr. Tresa Endo  . Hyperlipidemia   . Hypertension   . Hepatitis   . Obesity, morbid   . Benign prostatic hypertrophy   . Cataracts, bilateral   . Carpal tunnel syndrome, bilateral   . Diverticulosis of colon   . Diabetes mellitus type II   . Nephrolithiasis   . Low back pain   . ALLERGIC RHINITIS   . Head mass    Past Surgical History  Procedure Date  . Appendectomy   . Coronary artery bypass graft     reports that he has quit smoking. He does not have any smokeless tobacco history on file. He reports that he does not drink alcohol or use illicit drugs. family history includes Cancer in his brother; Dementia in his father; Diabetes in his other; and Heart attack in his brother and mother. No Known Allergies Current Outpatient Prescriptions on File Prior to Visit  Medication Sig Dispense Refill  . Ascorbic Acid (VITAMIN C) 500 MG tablet Take 500 mg by mouth daily.        . Aspirin (ADULT ASPIRIN LOW STRENGTH) 81 MG EC  tablet Take 81 mg by mouth daily.        . clopidogrel (PLAVIX) 75 MG tablet Take 1 tablet (75 mg total) by mouth daily.  90 tablet  3  . folic acid (FOLVITE) 400 MCG tablet Take 1 tablet (400 mcg total) by mouth daily.  90 tablet  3  . LEVITRA 20 MG tablet TAKE ONE TABLET BY MOUTH EVERY OTHER DAY AS NEEDED  5 each  11  . losartan-hydrochlorothiazide (HYZAAR) 100-12.5 MG per tablet Take 1 tablet by mouth daily.  90 tablet  3  . Multiple Vitamin (MULTIVITAMIN) capsule Take 1 capsule by mouth daily.        Marland Kitchen DISCONTD: metoprolol tartrate (LOPRESSOR) 25 MG tablet 1 and 1/2 tab by mouth twice per day (for total 75 mg)  270 tablet  0  . atorvastatin (LIPITOR) 40 MG tablet Take 1 tablet (40 mg total) by mouth daily.  90 tablet  3  . niacin (NIASPAN) 750 MG CR tablet Take 1 tablet (750 mg total) by mouth at bedtime.  100 tablet  3   Review of Systems Review of Systems  Constitutional: Negative for diaphoresis, activity change, appetite change and unexpected weight change.  HENT: Negative for hearing loss, ear pain, facial swelling, mouth sores and neck stiffness.   Eyes: Negative for pain, redness and visual disturbance.  Respiratory: Negative for shortness of breath and wheezing.  Cardiovascular: Negative for chest pain and palpitations.  Gastrointestinal: Negative for diarrhea, blood in stool, abdominal distention and rectal pain.  Genitourinary: Negative for hematuria, flank pain and decreased urine volume. does have some urinary freq during the day, only 2 times per night Musculoskeletal: Negative for myalgias and joint swelling.  Skin: Negative for color change and wound.  Neurological: Negative for syncope and numbness.  Hematological: Negative for adenopathy.  Psychiatric/Behavioral: Negative for hallucinations, self-injury, decreased concentration and agitation.     Objective:   Physical Exam BP 122/62  Pulse 52  Temp 97 F (36.1 C) (Oral)  Ht 5\' 6"  (1.676 m)  Wt 231 lb 2 oz  (104.838 kg)  BMI 37.30 kg/m2  SpO2 97% Physical Exam  VS noted Constitutional: Pt is oriented to person, place, and time. Appears well-developed and well-nourished.  HENT:  Head: Normocephalic and atraumatic.  Right Ear: External ear normal.  Left Ear: External ear normal.  Nose: Nose normal.  Mouth/Throat: Oropharynx is clear and moist.  Eyes: Conjunctivae and EOM are normal. Pupils are equal, round, and reactive to light.  Neck: Normal range of motion. Neck supple. No JVD present. No tracheal deviation present.  Cardiovascular: Normal rate, regular rhythm, normal heart sounds and intact distal pulses.   Pulmonary/Chest: Effort normal and breath sounds normal.  Abdominal: Soft. Bowel sounds are normal. There is no tenderness.  Musculoskeletal: Normal range of motion. Exhibits no edema.  Lymphadenopathy:  Has no cervical adenopathy.  Neurological: Pt is alert and oriented to person, place, and time. Pt has normal reflexes. No cranial nerve deficit.  Skin: Skin is warm and dry. No rash noted.  Psychiatric:  Has  normal mood and affect. Behavior is normal.     Assessment & Plan:

## 2012-04-24 ENCOUNTER — Encounter: Payer: Self-pay | Admitting: Internal Medicine

## 2012-04-24 NOTE — Assessment & Plan Note (Signed)
Mild sympt worse  - for flomax trial

## 2012-04-24 NOTE — Assessment & Plan Note (Signed)
stable overall by hx and exam, most recent data reviewed with pt, and pt to continue medical treatment as before Lab Results  Component Value Date   HGBA1C 6.3 04/14/2012    

## 2012-04-24 NOTE — Assessment & Plan Note (Signed)

## 2012-06-06 ENCOUNTER — Other Ambulatory Visit: Payer: Self-pay | Admitting: Internal Medicine

## 2012-08-06 ENCOUNTER — Telehealth: Payer: Self-pay | Admitting: Internal Medicine

## 2012-08-06 MED ORDER — ATORVASTATIN CALCIUM 40 MG PO TABS: 40.0000 mg | ORAL_TABLET | Freq: Every day | ORAL | Status: DC

## 2012-08-06 MED ORDER — CLOPIDOGREL BISULFATE 75 MG PO TABS: 75.0000 mg | ORAL_TABLET | Freq: Every day | ORAL | Status: DC

## 2012-08-06 MED ORDER — LOSARTAN POTASSIUM-HCTZ 100-12.5 MG PO TABS: 1.0000 | ORAL_TABLET | Freq: Every day | ORAL | Status: DC

## 2012-08-06 NOTE — Telephone Encounter (Signed)
Patient wants to speak with Andres [name] about having all of his medications switched to his new mail order pharmacy now that he has switched insurances

## 2012-08-06 NOTE — Telephone Encounter (Signed)
Called the patient and sent in requested prescriptions to  [facility].

## 2012-08-10 ENCOUNTER — Other Ambulatory Visit: Payer: Self-pay | Admitting: Orthopedic Surgery

## 2012-08-12 ENCOUNTER — Ambulatory Visit (INDEPENDENT_AMBULATORY_CARE_PROVIDER_SITE_OTHER): Payer: Medicare Other | Admitting: Internal Medicine

## 2012-08-12 ENCOUNTER — Encounter: Payer: Self-pay | Admitting: Internal Medicine

## 2012-08-12 VITALS — BP 120/78 | HR 62 | Temp 96.8°F | Ht 66.0 in | Wt 231.4 lb

## 2012-08-12 DIAGNOSIS — I10 Essential (primary) hypertension: Secondary | ICD-10-CM

## 2012-08-12 DIAGNOSIS — E119 Type 2 diabetes mellitus without complications: Secondary | ICD-10-CM

## 2012-08-12 DIAGNOSIS — M19049 Primary osteoarthritis, unspecified hand: Secondary | ICD-10-CM

## 2012-08-12 MED ORDER — METOPROLOL TARTRATE 25 MG PO TABS: 50.0000 mg | ORAL_TABLET | Freq: Two times a day (BID) | ORAL | Status: DC

## 2012-08-12 MED ORDER — METOPROLOL TARTRATE 50 MG PO TABS: 50.0000 mg | ORAL_TABLET | Freq: Two times a day (BID) | ORAL | Status: DC

## 2012-08-12 MED ORDER — DICLOFENAC SODIUM 1 % TD GEL: 2.0000 g | Freq: Four times a day (QID) | TRANSDERMAL | Status: DC | PRN

## 2012-08-12 NOTE — Assessment & Plan Note (Addendum)
stable overall by hx and exam, most recent data reviewed with pt, and pt to continue medical treatment as before BP Readings from Last 3 Encounters:  08/12/12 120/78  04/21/12 122/62  04/16/11 102/60   ECG reviewed as per emr

## 2012-08-12 NOTE — Assessment & Plan Note (Signed)
With mod to severe bony spur first MCP and recurrent pain but functionally nonlimiting; for voltaren gel prn,  to f/u any worsening symptoms or concerns, declines hand surgury referral

## 2012-08-12 NOTE — Assessment & Plan Note (Signed)
stable overall by hx and exam, most recent data reviewed with pt, and pt to continue medical treatment as before Lab Results  Component Value Date   HGBA1C 6.3 04/14/2012

## 2012-08-12 NOTE — Patient Instructions (Addendum)
Your metoprolol prescription was changed from the 25 mg pills to the 50 mg as per your request Take all new medications as prescribed - the gel for the left hand arthritic pain Continue all other medications as before Please have the pharmacy call with any other refills you may need. Please continue your efforts at being more active, low cholesterol diet, and weight control. Please remember to sign up for My Chart at your earliest convenience, as this will be important to you in the future with finding out test results.

## 2012-08-12 NOTE — Progress Notes (Signed)
Subjective:    Patient ID: Andres [name], male    DOB: [birth year], 72 y.o.   MRN: [id number]  HPI  Here with 2-3 wks intermittent mild but occas severe pain to the known large bony spur area to left hand first MCP without trauma, effusion or recent overuse, has seen hand surgury for this and offered surgury but he has deferred.  Pt denies chest pain, increased sob or doe, wheezing, orthopnea, PND, increased LE swelling, palpitations, dizziness or syncope.   Pt denies polydipsia, polyuria. Pt denies new neurological symptoms such as new headache, or facial or extremity weakness or numbness  Overall good compliance with treatment, and good medicine tolerability.  No current CTS like symptoms to hands and wrists Past Medical History  Diagnosis Date  . Coronary artery disease     Dr. Tresa Endo  . Hyperlipidemia   . Hypertension   . Hepatitis   . Obesity, morbid   . Benign prostatic hypertrophy   . Cataracts, bilateral   . Carpal tunnel syndrome, bilateral   . Diverticulosis of colon   . Diabetes mellitus type II   . Nephrolithiasis   . Low back pain   . ALLERGIC RHINITIS   . Head mass    Past Surgical History  Procedure Date  . Appendectomy   . Coronary artery bypass graft     reports that he has quit smoking. He does not have any smokeless tobacco history on file. He reports that he does not drink alcohol or use illicit drugs. family history includes Cancer in his brother; Dementia in his father; Diabetes in his other; and Heart attack in his brother and mother. No Known Allergies Current Outpatient Prescriptions on File Prior to Visit  Medication Sig Dispense Refill  . Ascorbic Acid (VITAMIN C) 500 MG tablet Take 500 mg by mouth daily.        . Aspirin (ADULT ASPIRIN LOW STRENGTH) 81 MG EC tablet Take 81 mg by mouth daily.        Marland Kitchen atorvastatin (LIPITOR) 40 MG tablet Take 1 tablet (40 mg total) by mouth daily.  90 tablet  3  . clopidogrel (PLAVIX) 75 MG tablet Take 1 tablet (75 mg  total) by mouth daily.  90 tablet  3  . folic acid (FOLVITE) 400 MCG tablet Take 1 tablet (400 mcg total) by mouth daily.  90 tablet  3  . LEVITRA 20 MG tablet TAKE ONE TABLET BY MOUTH EVERY OTHER DAY AS NEEDED  5 each  11  . losartan-hydrochlorothiazide (HYZAAR) 100-12.5 MG per tablet Take 1 tablet by mouth daily.  90 tablet  3  . Multiple Vitamin (MULTIVITAMIN) capsule Take 1 capsule by mouth daily.        . Tamsulosin HCl (FLOMAX) 0.4 MG CAPS Take 1 capsule (0.4 mg total) by mouth daily.  90 capsule  3  . niacin (NIASPAN) 750 MG CR tablet Take 1 tablet (750 mg total) by mouth at bedtime.  100 tablet  3   Review of Systems  Constitutional: Negative for diaphoresis and unexpected weight change.  HENT: Negative for tinnitus.   Eyes: Negative for photophobia and visual disturbance.  Respiratory: Negative for choking and stridor.   Gastrointestinal: Negative for vomiting and blood in stool.  Genitourinary: Negative for hematuria and decreased urine volume.  Musculoskeletal: Negative for gait problem.  Skin: Negative for color change and wound.  Neurological: Negative for tremors and numbness.  Psychiatric/Behavioral: Negative for decreased concentration. The patient is not hyperactive.  Objective:   Physical Exam BP 120/78  Pulse 62  Temp 96.8 F (36 C) (Oral)  Ht 5\' 6"  (1.676 m)  Wt 231 lb 6 oz (104.951 kg)  BMI 37.34 kg/m2  SpO2 96% Physical Exam  VS noted Constitutional: Pt appears well-developed and well-nourished.  HENT: Head: Normocephalic.  Right Ear: External ear normal.  Left Ear: External ear normal.  Eyes: Conjunctivae and EOM are normal. Pupils are equal, round, and reactive to light.  Neck: Normal range of motion. Neck supple.  Cardiovascular: Normal rate and regular rhythm.   Pulmonary/Chest: Effort normal and breath sounds normal.  Neurological: Pt is alert. Not confused  Skin: Skin is warm. No erythema.  Psychiatric: Pt behavior is normal. Thought content  normal.  Left hand first MCP with large bony spur projection, mild tender but wtihout effusion, had FROM, and hand o/w neurovasc intact    Assessment & Plan:

## 2012-08-17 ENCOUNTER — Telehealth: Payer: Self-pay

## 2012-08-17 NOTE — Telephone Encounter (Signed)
50 mg bid is the correct, was changed jan 9

## 2012-08-17 NOTE — Telephone Encounter (Signed)
Please clarify duplicate therapy of lopressor 25 mg and lopressor 50 mg please advise

## 2012-08-17 NOTE — Telephone Encounter (Signed)
Opened in error

## 2012-08-17 NOTE — Telephone Encounter (Signed)
Faxed correction to pharmacy

## 2012-08-25 ENCOUNTER — Ambulatory Visit (HOSPITAL_BASED_OUTPATIENT_CLINIC_OR_DEPARTMENT_OTHER): Admission: RE | Admit: 2012-08-25 | Payer: Medicare Other | Source: Ambulatory Visit | Admitting: Orthopedic Surgery

## 2012-08-25 ENCOUNTER — Encounter (HOSPITAL_BASED_OUTPATIENT_CLINIC_OR_DEPARTMENT_OTHER): Admission: RE | Payer: Self-pay | Source: Ambulatory Visit

## 2012-08-25 SURGERY — CLOSED REDUCTION, FRACTURE, METACARPAL BONE
Anesthesia: Choice | Laterality: Left

## 2012-10-05 ENCOUNTER — Other Ambulatory Visit: Payer: Self-pay

## 2012-10-05 MED ORDER — TAMSULOSIN HCL 0.4 MG PO CAPS: 0.4000 mg | ORAL_CAPSULE | Freq: Every day | ORAL | Status: DC

## 2012-10-05 MED ORDER — FOLIC ACID 400 MCG PO TABS: 400.0000 ug | ORAL_TABLET | Freq: Every day | ORAL | Status: DC

## 2013-01-14 ENCOUNTER — Encounter: Payer: Self-pay | Admitting: Cardiovascular Disease

## 2013-01-17 ENCOUNTER — Encounter: Payer: Self-pay | Admitting: Cardiovascular Disease

## 2013-01-17 ENCOUNTER — Ambulatory Visit (INDEPENDENT_AMBULATORY_CARE_PROVIDER_SITE_OTHER): Payer: Medicare Other | Admitting: Cardiovascular Disease

## 2013-01-17 VITALS — BP 124/70 | HR 52 | Ht 66.0 in | Wt 229.2 lb

## 2013-01-17 DIAGNOSIS — I2510 Atherosclerotic heart disease of native coronary artery without angina pectoris: Secondary | ICD-10-CM

## 2013-01-17 DIAGNOSIS — E785 Hyperlipidemia, unspecified: Secondary | ICD-10-CM

## 2013-01-17 DIAGNOSIS — I10 Essential (primary) hypertension: Secondary | ICD-10-CM

## 2013-01-17 DIAGNOSIS — E669 Obesity, unspecified: Secondary | ICD-10-CM

## 2013-01-17 DIAGNOSIS — G459 Transient cerebral ischemic attack, unspecified: Secondary | ICD-10-CM

## 2013-01-17 NOTE — Progress Notes (Signed)
Patient ID: Andres [name], male   DOB: November 20, 1940, 72 y.o.   MRN: [id number]     HPI: Andres [name], is a 72 y.o. male who presents to the office today for a 6 month cardiology evaluation.   Andres [name] has established coronary as well a cerebral vascular disease. In 1997 he underwent CABG revascularization surgery x6 by Dr. Nydia Bouton and had a LIMA placed to the LAD, sequential vein to the intermediate and second diagonal, vein to the OM 2, sequential vein to the acute margin and PDA. 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 A nuclear perfusion study in March 2012 is unchanged and show previously noted inferior scar. An echo Doppler study in May 2013 showed mild asymmetric left ventricular hypertrophy with an ejection fraction of 50-55%. There was mild posterior wall hypokinesis with mild inferior wall hypokinesis. He had moderate mitral regurgitation and mild aortic valve sclerosis with mild tricuspid insufficiency.    Over the past 6 months Andres [name] states that he has felt fairly well. He denies recent angina symptoms. He denies any significant shortness of breath. He denies any significant awareness of tachycardia palpitations.  Past Medical History  Diagnosis Date  . Hyperlipidemia   . Hypertension   . Hepatitis   . Obesity, morbid   . Benign prostatic hypertrophy   . Cataracts, bilateral   . Carpal tunnel syndrome, bilateral   . Diverticulosis of colon   . Nephrolithiasis   . Low back pain   . ALLERGIC RHINITIS   . Head mass   . Diabetes mellitus type II   . Coronary artery disease     Dr. Tresa Endo; 2D ECHO, 12/11/2011 - EF 50-55%, normal; NUCLEAR STRESS TEST, 10/16/2010 - no evidemce of inducible ischemia  . 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 Appendectomy    . Coronary artery  bypass graft      No Known Allergies  Current Outpatient Prescriptions  Medication Sig Dispense Refill  . Ascorbic Acid (VITAMIN C) 500 MG tablet Take 500 mg by mouth daily.        . Aspirin (ADULT ASPIRIN LOW STRENGTH) 81 MG EC tablet Take 81 mg by mouth daily.        Marland Kitchen atorvastatin (LIPITOR) 40 MG tablet Take 1 tablet (40 mg total) by mouth daily.  90 tablet  3  . clopidogrel (PLAVIX) 75 MG tablet Take 1 tablet (75 mg total) by mouth daily.  90 tablet  3  . diclofenac sodium (VOLTAREN) 1 % GEL Apply 2 g topically 4 (four) times daily as needed.  100 g  5  . folic acid (FOLVITE) 400 MCG tablet Take 1 tablet (400 mcg total) by mouth daily.  90 tablet  3  . LEVITRA 20 MG tablet TAKE ONE TABLET BY MOUTH EVERY OTHER DAY AS NEEDED  5 each  11  . losartan-hydrochlorothiazide (HYZAAR) 100-12.5 MG per tablet Take 1 tablet by mouth daily.  90 tablet  3  . metoprolol (LOPRESSOR) 50 MG tablet Take 1 tablet (50 mg total) by mouth 2 (two) times daily.  180 tablet  3  . Multiple Vitamin (MULTIVITAMIN) capsule Take 1 capsule by mouth daily.        . niacin (NIASPAN) 750 MG CR tablet Take 750 mg by mouth at bedtime.      Marland Kitchen  tamsulosin (FLOMAX) 0.4 MG CAPS Take 1 capsule (0.4 mg total) by mouth daily.  90 capsule  3   No current facility-administered medications for this visit.    Social history is notable that he is widowed and has 2 children one grandchild. He does try to walk her there is no tobacco or alcohol use.  ROS is negative for fever chills night sweats. He denies rash. He denies any significant increasing shortness of breath. There is no chest pain. He denies presyncope or syncope. He is unaware of palpitations or cardiac arrhythmia. He denies bleeding. He denies abdominal complaints. He dies any major change in weight. He denies significant edema. He denies new neurologic symptoms. Other system review is negative.  PE BP 124/70  Pulse 52  Ht 5\' 6"  (1.676 m)  Wt 229 lb 3.2 oz (103.964 kg)   BMI 37.01 kg/m2  General: Alert, oriented, no distress.  HEENT: Normocephalic, atraumatic. Pupils round and reactive; sclera anicteric;no lid lag,  Nose without nasal septal hypertrophy Mouth/Parynx benign; Mallinpatti scale 3 Neck: No JVD, no carotid briuts Lungs: clear to ausculatation and percussion; no wheezing or rales Heart: RRR, s1 s2 normal 1/6 systolic murmur. Abdomen: Moderate central adiposity; soft, nontender; no hepatosplenomehaly, BS+; abdominal aorta nontender and not dilated by palpation. Pulses 2+ Extremities: no clubbing cyanosis or edema, Homan's sign negative  Neurologic: grossly nonfocal  ECG: Sinus bradycardia with sinus arrhythmia/PACs. Nonspecific T wave abnormality. Mild RV conduction delay.  LABS:  BMET    Component Value Date/Time   NA 141 04/14/2012 0903   K 4.4 04/14/2012 0903   CL 104 04/14/2012 0903   CO2 30 04/14/2012 0903   GLUCOSE 99 04/14/2012 0903   BUN 20 04/14/2012 0903   CREATININE 1.1 04/14/2012 0903   CALCIUM 9.3 04/14/2012 0903   GFRNONAA 93.62 12/20/2009 1058   GFRAA 95 06/08/2008 1023     Hepatic Function Panel     Component Value Date/Time   PROT 6.5 04/14/2012 0903   ALBUMIN 3.9 04/14/2012 0903   AST 37 04/14/2012 0903   ALT 26 04/14/2012 0903   ALKPHOS 68 04/14/2012 0903   BILITOT 0.9 04/14/2012 0903   BILIDIR 0.2 04/14/2012 0903     CBC    Component Value Date/Time   WBC 4.1* 04/14/2012 0903   RBC 3.80* 04/14/2012 0903   HGB 12.7* 04/14/2012 0903   HCT 38.7* 04/14/2012 0903   PLT 161.0 04/14/2012 0903   MCV 101.7* 04/14/2012 0903   MCHC 32.7 04/14/2012 0903   RDW 14.3 04/14/2012 0903   LYMPHSABS 1.8 04/14/2012 0903   MONOABS 0.3 04/14/2012 0903   EOSABS 0.1 04/14/2012 0903   BASOSABS 0.0 04/14/2012 0903     BNP No results found for this basename: probnp    Lipid Panel     Component Value Date/Time   CHOL 129 04/14/2012 0903   TRIG 64.0 04/14/2012 0903   HDL 41.80 04/14/2012 0903   CHOLHDL 3 04/14/2012 0903   VLDL 12.8 04/14/2012  0903   LDLCALC 74 04/14/2012 0903     RADIOLOGY: No results found.    ASSESSMENT AND PLAN: Andres [name] is now 17 years status post CABG revascularization surgery or his last stress test in March 2012 was unchanged from previously and revealed inferior scar without associated ischemia. He presently is not having symptoms of chest pain or shortness of breath. Blood pressure today is well controlled. His body mass index is 37 and we discussed the importance of continued weight loss. He tells  me he will be having repeat laboratory by his primary physician in September and I will review those when available. His lipids studies last September were very good on continued combination therapy for his hyperlipidemia. Presently is not having any neurologic symptoms to continue his current medical regimen. Will see him in 6 months for followup evaluation.     Lennette Bihari, MD, [facility]  01/17/2013 3:19 PM

## 2013-01-17 NOTE — Patient Instructions (Signed)
Your physician recommends that you schedule a follow-up appointment in: 6 MONTHS 

## 2013-02-02 ENCOUNTER — Telehealth: Payer: Self-pay | Admitting: *Deleted

## 2013-02-02 NOTE — Telephone Encounter (Signed)
Andres [name] from [facility] called states Rx for Volterin Gel has been approved on 7.1.2014 with no expiration.

## 2013-04-18 ENCOUNTER — Other Ambulatory Visit (INDEPENDENT_AMBULATORY_CARE_PROVIDER_SITE_OTHER): Payer: Medicare Other

## 2013-04-18 DIAGNOSIS — E119 Type 2 diabetes mellitus without complications: Secondary | ICD-10-CM

## 2013-04-18 DIAGNOSIS — Z0000 Encounter for general adult medical examination without abnormal findings: Secondary | ICD-10-CM

## 2013-04-18 LAB — BASIC METABOLIC PANEL
CO2: 30 mEq/L (ref 19–32)
Calcium: 9.3 mg/dL (ref 8.4–10.5)
Creatinine, Ser: 1.1 mg/dL (ref 0.4–1.5)
GFR: 81.48 mL/min (ref >60.00)
Sodium: 140 mEq/L (ref 135–145)

## 2013-04-18 LAB — LIPID PANEL
Cholesterol: 119 mg/dL (ref 0–200)
HDL: 38.8 mg/dL — ABNORMAL LOW (ref >39.00)
Total CHOL/HDL Ratio: 3
Triglycerides: 69 mg/dL (ref 0.0–149.0)

## 2013-04-18 LAB — PSA: PSA: 0.4 ng/mL (ref 0.10–4.00)

## 2013-04-18 LAB — CBC WITH DIFFERENTIAL/PLATELET
Basophils Relative: 0.4 % (ref 0.0–3.0)
Eosinophils Relative: 2.4 % (ref 0.0–5.0)
HCT: 37.5 % — ABNORMAL LOW (ref 39.0–52.0)
Hemoglobin: 12.8 g/dL — ABNORMAL LOW (ref 13.0–17.0)
Lymphocytes Relative: 47.5 % — ABNORMAL HIGH (ref 12.0–46.0)
Lymphs Abs: 2 10*3/uL (ref 0.7–4.0)
Monocytes Relative: 9.6 % (ref 3.0–12.0)
Neutro Abs: 1.7 10*3/uL (ref 1.4–7.7)
RBC: 3.85 Mil/uL — ABNORMAL LOW (ref 4.22–5.81)
RDW: 14.1 % (ref 11.5–14.6)

## 2013-04-18 LAB — URINALYSIS, ROUTINE W REFLEX MICROSCOPIC
Bilirubin Urine: NEGATIVE
Hgb urine dipstick: NEGATIVE
Ketones, ur: NEGATIVE
Nitrite: NEGATIVE
Total Protein, Urine: NEGATIVE
pH: 7 (ref 5.0–8.0)

## 2013-04-18 LAB — HEPATIC FUNCTION PANEL
ALT: 27 U/L (ref 0–53)
AST: 34 U/L (ref 0–37)
Albumin: 3.9 g/dL (ref 3.5–5.2)
Alkaline Phosphatase: 61 U/L (ref 39–117)
Total Protein: 6.1 g/dL (ref 6.0–8.3)

## 2013-04-18 LAB — TSH: TSH: 0.8 u[IU]/mL (ref 0.35–5.50)

## 2013-04-18 LAB — HEMOGLOBIN A1C: Hgb A1c MFr Bld: 6.8 % — ABNORMAL HIGH (ref 4.6–6.5)

## 2013-04-18 LAB — MICROALBUMIN / CREATININE URINE RATIO
Creatinine,U: 122 mg/dL
Microalb, Ur: 1.1 mg/dL (ref 0.0–1.9)

## 2013-04-27 ENCOUNTER — Encounter: Payer: Self-pay | Admitting: Internal Medicine

## 2013-04-27 ENCOUNTER — Ambulatory Visit (INDEPENDENT_AMBULATORY_CARE_PROVIDER_SITE_OTHER): Payer: Medicare Other | Admitting: Internal Medicine

## 2013-04-27 VITALS — BP 106/68 | HR 58 | Temp 97.2°F | Ht 66.0 in | Wt 231.0 lb

## 2013-04-27 DIAGNOSIS — IMO0001 Reserved for inherently not codable concepts without codable children: Secondary | ICD-10-CM

## 2013-04-27 DIAGNOSIS — Z23 Encounter for immunization: Secondary | ICD-10-CM

## 2013-04-27 DIAGNOSIS — E119 Type 2 diabetes mellitus without complications: Secondary | ICD-10-CM

## 2013-04-27 DIAGNOSIS — Z0000 Encounter for general adult medical examination without abnormal findings: Secondary | ICD-10-CM

## 2013-04-27 NOTE — Assessment & Plan Note (Signed)

## 2013-04-27 NOTE — Addendum Note (Signed)
Addended by: Scharlene Gloss B on: 04/27/2013 10:25 AM   Modules accepted: Orders

## 2013-04-27 NOTE — Assessment & Plan Note (Signed)
stable overall by history and exam, recent data reviewed with pt, and pt to continue medical treatment as before,  to f/u any worsening symptoms or concerns Lab Results  Component Value Date   HGBA1C 6.8* 04/18/2013    

## 2013-04-27 NOTE — Progress Notes (Signed)
Subjective:    Patient ID: Andres [name], male    DOB: 08/15/1940, 72 y.o.   MRN: [id number]  HPI  Here for wellness and f/u;  Overall doing ok;  Pt denies CP, worsening SOB, DOE, wheezing, orthopnea, PND, worsening LE edema, palpitations, dizziness or syncope.  Pt denies neurological change such as new headache, facial or extremity weakness.  Pt denies polydipsia, polyuria, or low sugar symptoms. Pt states overall good compliance with treatment and medications, good tolerability, and has been trying to follow lower cholesterol diet.  Pt denies worsening depressive symptoms, suicidal ideation or panic. No fever, night sweats, wt loss, loss of appetite, or other constitutional symptoms.  Pt states good ability with ADL's, has low fall risk, home safety reviewed and adequate, no other significant changes in hearing or vision, and tries to stay active with exercise, goes to gym 2 hrs 4 days per wk, and golf 2 days per wk.  Past Medical History  Diagnosis Date  . Hyperlipidemia   . Hypertension   . Hepatitis   . Obesity, morbid   . Benign prostatic hypertrophy   . Cataracts, bilateral   . Carpal tunnel syndrome, bilateral   . Diverticulosis of colon   . Nephrolithiasis   . Low back pain   . ALLERGIC RHINITIS   . Head mass   . Diabetes mellitus type II   . Coronary artery disease     Dr. Tresa Endo; 2D ECHO, 12/11/2011 - EF 50-55%, normal; NUCLEAR STRESS TEST, 10/16/2010 - no evidemce of inducible ischemia  . 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 Appendectomy    . Coronary artery bypass graft      reports that he has quit smoking. He does not have any smokeless tobacco history on file. He reports that he does not drink alcohol or use illicit drugs. family history includes Alzheimer's disease in his father; Cancer in his  brother; Heart attack in his brother, maternal grandmother, and mother; Hypertension in his maternal grandmother and son; Kidney disease in his brother and son. No Known Allergies Current Outpatient Prescriptions on File Prior to Visit  Medication Sig Dispense Refill  . Ascorbic Acid (VITAMIN C) 500 MG tablet Take 500 mg by mouth daily.        . Aspirin (ADULT ASPIRIN LOW STRENGTH) 81 MG EC tablet Take 81 mg by mouth daily.        Marland Kitchen atorvastatin (LIPITOR) 40 MG tablet Take 1 tablet (40 mg total) by mouth daily.  90 tablet  3  . clopidogrel (PLAVIX) 75 MG tablet Take 1 tablet (75 mg total) by mouth daily.  90 tablet  3  . diclofenac sodium (VOLTAREN) 1 % GEL Apply 2 g topically 4 (four) times daily as needed.  100 g  5  . folic acid (FOLVITE) 400 MCG tablet Take 1 tablet (400 mcg total) by mouth daily.  90 tablet  3  . LEVITRA 20 MG tablet TAKE ONE TABLET BY MOUTH EVERY OTHER DAY AS NEEDED  5 each  11  . losartan-hydrochlorothiazide (HYZAAR) 100-12.5 MG per tablet Take 1 tablet by mouth daily.  90 tablet  3  . metoprolol (LOPRESSOR) 50 MG tablet Take 1 tablet (50 mg total) by mouth 2 (two) times daily.  180 tablet  3  . Multiple Vitamin (MULTIVITAMIN) capsule Take 1 capsule  by mouth daily.        . niacin (NIASPAN) 750 MG CR tablet Take 750 mg by mouth at bedtime.      . tamsulosin (FLOMAX) 0.4 MG CAPS Take 1 capsule (0.4 mg total) by mouth daily.  90 capsule  3   No current facility-administered medications on file prior to visit.   Review of Systems Constitutional: Negative for diaphoresis, activity change, appetite change or unexpected weight change.  HENT: Negative for hearing loss, ear pain, facial swelling, mouth sores and neck stiffness.   Eyes: Negative for pain, redness and visual disturbance.  Respiratory: Negative for shortness of breath and wheezing.   Cardiovascular: Negative for chest pain and palpitations.  Gastrointestinal: Negative for diarrhea, blood in stool, abdominal  distention or other pain Genitourinary: Negative for hematuria, flank pain or change in urine volume.  Musculoskeletal: Negative for myalgias and joint swelling.  Skin: Negative for color change and wound.  Neurological: Negative for syncope and numbness. other than noted Hematological: Negative for adenopathy.  Psychiatric/Behavioral: Negative for hallucinations, self-injury, decreased concentration and agitation.      Objective:   Physical Exam BP 106/68  Pulse 58  Temp(Src) 97.2 F (36.2 C) (Oral)  Ht 5\' 6"  (1.676 m)  Wt 231 lb (104.781 kg)  BMI 37.3 kg/m2  SpO2 95% VS noted,  Constitutional: Pt is oriented to person, place, and time. Appears well-developed and well-nourished.  Head: Normocephalic and atraumatic.  Right Ear: External ear normal.  Left Ear: External ear normal.  Nose: Nose normal.  Mouth/Throat: Oropharynx is clear and moist.  Eyes: Conjunctivae and EOM are normal. Pupils are equal, round, and reactive to light.  Neck: Normal range of motion. Neck supple. No JVD present. No tracheal deviation present.  Cardiovascular: Normal rate, regular rhythm, normal heart sounds and intact distal pulses.   Pulmonary/Chest: Effort normal and breath sounds normal.  Abdominal: Soft. Bowel sounds are normal. There is no tenderness. No HSM  Musculoskeletal: Normal range of motion. Exhibits no edema.  Lymphadenopathy:  Has no cervical adenopathy.  Neurological: Pt is alert and oriented to person, place, and time. Pt has normal reflexes. No cranial nerve deficit.  Skin: Skin is warm and dry. No rash noted.  Psychiatric:  Has  normal mood and affect. Behavior is normal.  GU with enlarged nontender scrotum bilat with prob normal testes but palpable varicoele vs hydrocele bilat    Assessment & Plan:

## 2013-04-27 NOTE — Patient Instructions (Signed)
You had the Prevnar (pneumoccocal)shot today, and the flu shot Please continue all other medications as before, and refills have been done if requested. You do not appear to have hernias today, but more likely varicocele or hydroceles, which do not require treatment Please have the pharmacy call with any other refills you may need. Please continue your efforts at being more active, low cholesterol diet, and weight control. You are otherwise up to date with prevention measures today.  Please remember to sign up for My Chart if you have not done so, as this will be important to you in the future with finding out test results, communicating by private email, and scheduling acute appointments online when needed.  Please return in 6 months, or sooner if needed, with Lab testing done 3-5 days before

## 2013-05-25 ENCOUNTER — Telehealth: Payer: Self-pay

## 2013-05-25 MED ORDER — CLOPIDOGREL BISULFATE 75 MG PO TABS: 75.0000 mg | ORAL_TABLET | Freq: Every day | ORAL | Status: DC

## 2013-05-25 MED ORDER — ATORVASTATIN CALCIUM 40 MG PO TABS: 40.0000 mg | ORAL_TABLET | Freq: Every day | ORAL | Status: DC

## 2013-05-25 MED ORDER — METOPROLOL TARTRATE 50 MG PO TABS: 50.0000 mg | ORAL_TABLET | Freq: Two times a day (BID) | ORAL | Status: DC

## 2013-05-25 MED ORDER — LOSARTAN POTASSIUM-HCTZ 100-12.5 MG PO TABS: 1.0000 | ORAL_TABLET | Freq: Every day | ORAL | Status: DC

## 2013-05-25 NOTE — Telephone Encounter (Signed)
Refills done to [facility].

## 2013-06-01 ENCOUNTER — Other Ambulatory Visit: Payer: Self-pay | Admitting: *Deleted

## 2013-06-01 MED ORDER — FOLIC ACID 1 MG PO TABS: 1.0000 mg | ORAL_TABLET | Freq: Every day | ORAL | Status: DC

## 2013-06-08 ENCOUNTER — Telehealth: Payer: Self-pay | Admitting: Cardiovascular Disease

## 2013-06-08 NOTE — Telephone Encounter (Signed)
Message forwarded to J.C. Wildman, LPN.  

## 2013-06-08 NOTE — Telephone Encounter (Signed)
Folic Acid 1 mg daily was called into [facility] by [place] and not [facility]  Please correct as patient wants from [facility]

## 2013-06-13 ENCOUNTER — Other Ambulatory Visit: Payer: Self-pay | Admitting: *Deleted

## 2013-06-13 MED ORDER — FOLIC ACID 1 MG PO TABS: 1.0000 mg | ORAL_TABLET | Freq: Every day | ORAL | Status: DC

## 2013-07-18 ENCOUNTER — Other Ambulatory Visit: Payer: Self-pay | Admitting: *Deleted

## 2013-07-18 ENCOUNTER — Ambulatory Visit: Payer: Medicare Other | Admitting: Cardiovascular Disease

## 2013-07-18 MED ORDER — TAMSULOSIN HCL 0.4 MG PO CAPS: 0.4000 mg | ORAL_CAPSULE | Freq: Every day | ORAL | Status: DC

## 2013-07-21 ENCOUNTER — Encounter: Payer: Self-pay | Admitting: Internal Medicine

## 2013-07-21 ENCOUNTER — Emergency Department (HOSPITAL_COMMUNITY): Payer: Medicare Other

## 2013-07-21 ENCOUNTER — Ambulatory Visit (INDEPENDENT_AMBULATORY_CARE_PROVIDER_SITE_OTHER): Payer: Medicare Other | Admitting: Internal Medicine

## 2013-07-21 ENCOUNTER — Encounter (HOSPITAL_COMMUNITY): Payer: Self-pay | Admitting: Emergency Medicine

## 2013-07-21 ENCOUNTER — Inpatient Hospital Stay (HOSPITAL_COMMUNITY)
Admission: EM | Admit: 2013-07-21 | Discharge: 2013-08-02 | DRG: 350 | Disposition: A | Discharge status: Home / Self Care | Payer: Medicare Other | Attending: Internal Medicine | Admitting: Internal Medicine

## 2013-07-21 VITALS — BP 86/60 | HR 97 | Temp 96.6°F | Ht 66.0 in | Wt 229.0 lb

## 2013-07-21 DIAGNOSIS — R0602 Shortness of breath: Secondary | ICD-10-CM

## 2013-07-21 DIAGNOSIS — I959 Hypotension, unspecified: Secondary | ICD-10-CM

## 2013-07-21 DIAGNOSIS — E861 Hypovolemia: Secondary | ICD-10-CM | POA: Diagnosis present

## 2013-07-21 DIAGNOSIS — K56609 Unspecified intestinal obstruction, unspecified as to partial versus complete obstruction: Secondary | ICD-10-CM

## 2013-07-21 DIAGNOSIS — E860 Dehydration: Secondary | ICD-10-CM

## 2013-07-21 DIAGNOSIS — Z87891 Personal history of nicotine dependence: Secondary | ICD-10-CM

## 2013-07-21 DIAGNOSIS — Z7902 Long term (current) use of antithrombotics/antiplatelets: Secondary | ICD-10-CM

## 2013-07-21 DIAGNOSIS — K5730 Diverticulosis of large intestine without perforation or abscess without bleeding: Secondary | ICD-10-CM

## 2013-07-21 DIAGNOSIS — Z79899 Other long term (current) drug therapy: Secondary | ICD-10-CM

## 2013-07-21 DIAGNOSIS — E119 Type 2 diabetes mellitus without complications: Secondary | ICD-10-CM

## 2013-07-21 DIAGNOSIS — I6529 Occlusion and stenosis of unspecified carotid artery: Secondary | ICD-10-CM | POA: Diagnosis present

## 2013-07-21 DIAGNOSIS — R778 Other specified abnormalities of plasma proteins: Secondary | ICD-10-CM

## 2013-07-21 DIAGNOSIS — M545 Low back pain, unspecified: Secondary | ICD-10-CM

## 2013-07-21 DIAGNOSIS — I2510 Atherosclerotic heart disease of native coronary artery without angina pectoris: Secondary | ICD-10-CM

## 2013-07-21 DIAGNOSIS — G3184 Mild cognitive impairment, so stated: Secondary | ICD-10-CM

## 2013-07-21 DIAGNOSIS — N400 Enlarged prostate without lower urinary tract symptoms: Secondary | ICD-10-CM

## 2013-07-21 DIAGNOSIS — R0609 Other forms of dyspnea: Secondary | ICD-10-CM

## 2013-07-21 DIAGNOSIS — Z0000 Encounter for general adult medical examination without abnormal findings: Secondary | ICD-10-CM

## 2013-07-21 DIAGNOSIS — K219 Gastro-esophageal reflux disease without esophagitis: Secondary | ICD-10-CM | POA: Diagnosis present

## 2013-07-21 DIAGNOSIS — Z6836 Body mass index (BMI) 36.0-36.9, adult: Secondary | ICD-10-CM

## 2013-07-21 DIAGNOSIS — K4090 Unilateral inguinal hernia, without obstruction or gangrene, not specified as recurrent: Secondary | ICD-10-CM

## 2013-07-21 DIAGNOSIS — J690 Pneumonitis due to inhalation of food and vomit: Secondary | ICD-10-CM

## 2013-07-21 DIAGNOSIS — Z951 Presence of aortocoronary bypass graft: Secondary | ICD-10-CM

## 2013-07-21 DIAGNOSIS — N433 Hydrocele, unspecified: Secondary | ICD-10-CM | POA: Diagnosis present

## 2013-07-21 DIAGNOSIS — Z87442 Personal history of urinary calculi: Secondary | ICD-10-CM

## 2013-07-21 DIAGNOSIS — E46 Unspecified protein-calorie malnutrition: Secondary | ICD-10-CM | POA: Diagnosis present

## 2013-07-21 DIAGNOSIS — I10 Essential (primary) hypertension: Secondary | ICD-10-CM

## 2013-07-21 DIAGNOSIS — K560 Paralytic ileus: Secondary | ICD-10-CM | POA: Diagnosis not present

## 2013-07-21 DIAGNOSIS — E869 Volume depletion, unspecified: Secondary | ICD-10-CM

## 2013-07-21 DIAGNOSIS — E785 Hyperlipidemia, unspecified: Secondary | ICD-10-CM

## 2013-07-21 DIAGNOSIS — Z01810 Encounter for preprocedural cardiovascular examination: Secondary | ICD-10-CM

## 2013-07-21 DIAGNOSIS — R0600 Dyspnea, unspecified: Secondary | ICD-10-CM

## 2013-07-21 DIAGNOSIS — I739 Peripheral vascular disease, unspecified: Secondary | ICD-10-CM | POA: Diagnosis present

## 2013-07-21 DIAGNOSIS — K759 Inflammatory liver disease, unspecified: Secondary | ICD-10-CM | POA: Diagnosis present

## 2013-07-21 DIAGNOSIS — K4030 Unilateral inguinal hernia, with obstruction, without gangrene, not specified as recurrent: Secondary | ICD-10-CM

## 2013-07-21 DIAGNOSIS — Z8673 Personal history of transient ischemic attack (TIA), and cerebral infarction without residual deficits: Secondary | ICD-10-CM

## 2013-07-21 DIAGNOSIS — J309 Allergic rhinitis, unspecified: Secondary | ICD-10-CM

## 2013-07-21 DIAGNOSIS — H269 Unspecified cataract: Secondary | ICD-10-CM

## 2013-07-21 DIAGNOSIS — K929 Disease of digestive system, unspecified: Secondary | ICD-10-CM | POA: Diagnosis not present

## 2013-07-21 DIAGNOSIS — Z7982 Long term (current) use of aspirin: Secondary | ICD-10-CM

## 2013-07-21 DIAGNOSIS — R7989 Other specified abnormal findings of blood chemistry: Secondary | ICD-10-CM

## 2013-07-21 DIAGNOSIS — G459 Transient cerebral ischemic attack, unspecified: Secondary | ICD-10-CM

## 2013-07-21 DIAGNOSIS — E669 Obesity, unspecified: Secondary | ICD-10-CM

## 2013-07-21 DIAGNOSIS — N179 Acute kidney failure, unspecified: Secondary | ICD-10-CM

## 2013-07-21 DIAGNOSIS — Z8679 Personal history of other diseases of the circulatory system: Secondary | ICD-10-CM

## 2013-07-21 DIAGNOSIS — R21 Rash and other nonspecific skin eruption: Secondary | ICD-10-CM

## 2013-07-21 HISTORY — DX: Unilateral inguinal hernia, without obstruction or gangrene, not specified as recurrent: K40.90

## 2013-07-21 HISTORY — DX: Atherosclerotic heart disease of native coronary artery without angina pectoris: I25.10

## 2013-07-21 HISTORY — DX: Gastro-esophageal reflux disease without esophagitis: K21.9

## 2013-07-21 HISTORY — DX: Cerebral infarction, unspecified: I63.9

## 2013-07-21 HISTORY — DX: Personal history of other diseases of the digestive system: Z87.19

## 2013-07-21 LAB — CBC WITH DIFFERENTIAL/PLATELET
Basophils Absolute: 0 10*3/uL (ref 0.0–0.1)
Basophils Relative: 0 % (ref 0–1)
Eosinophils Absolute: 0 10*3/uL (ref 0.0–0.7)
Eosinophils Relative: 0 % (ref 0–5)
HCT: 46.9 % (ref 39.0–52.0)
Hemoglobin: 17.1 g/dL — ABNORMAL HIGH (ref 13.0–17.0)
Lymphocytes Relative: 11 % — ABNORMAL LOW (ref 12–46)
Lymphs Abs: 0.8 10*3/uL (ref 0.7–4.0)
MCH: 34.8 pg — ABNORMAL HIGH (ref 26.0–34.0)
MCHC: 36.5 g/dL — ABNORMAL HIGH (ref 30.0–36.0)
MCV: 95.5 fL (ref 78.0–100.0)
Monocytes Absolute: 0.7 10*3/uL (ref 0.1–1.0)
Monocytes Relative: 10 % (ref 3–12)
Neutro Abs: 5.8 10*3/uL (ref 1.7–7.7)
Neutrophils Relative %: 79 % — ABNORMAL HIGH (ref 43–77)
Platelets: 228 10*3/uL (ref 150–400)
RBC: 4.91 MIL/uL (ref 4.22–5.81)
RDW: 13 % (ref 11.5–15.5)
WBC: 7.3 10*3/uL (ref 4.0–10.5)

## 2013-07-21 LAB — POCT I-STAT, CHEM 8
BUN: 60 mg/dL — ABNORMAL HIGH (ref 6–23)
Calcium, Ion: 1.09 mmol/L — ABNORMAL LOW (ref 1.13–1.30)
Chloride: 91 mEq/L — ABNORMAL LOW (ref 96–112)
Creatinine, Ser: 3.6 mg/dL — ABNORMAL HIGH (ref 0.50–1.35)
Glucose, Bld: 226 mg/dL — ABNORMAL HIGH (ref 70–99)
HCT: 51 % (ref 39.0–52.0)
Hemoglobin: 17.3 g/dL — ABNORMAL HIGH (ref 13.0–17.0)
Potassium: 3.6 mEq/L (ref 3.5–5.1)
Sodium: 133 mEq/L — ABNORMAL LOW (ref 135–145)
TCO2: 27 mmol/L (ref 0–100)

## 2013-07-21 LAB — TYPE AND SCREEN
ABO/RH(D): A POS
Antibody Screen: NEGATIVE

## 2013-07-21 LAB — POCT I-STAT TROPONIN I: Troponin i, poc: 0.21 ng/mL (ref 0.00–0.08)

## 2013-07-21 LAB — COMPREHENSIVE METABOLIC PANEL
ALT: 22 U/L (ref 0–53)
AST: 30 U/L (ref 0–37)
Albumin: 4 g/dL (ref 3.5–5.2)
Alkaline Phosphatase: 63 U/L (ref 39–117)
BUN: 66 mg/dL — ABNORMAL HIGH (ref 6–23)
CO2: 25 mEq/L (ref 19–32)
Calcium: 9.7 mg/dL (ref 8.4–10.5)
Chloride: 86 mEq/L — ABNORMAL LOW (ref 96–112)
Creatinine, Ser: 3.59 mg/dL — ABNORMAL HIGH (ref 0.50–1.35)
GFR calc Af Amer: 18 mL/min — ABNORMAL LOW (ref >90)
GFR calc non Af Amer: 15 mL/min — ABNORMAL LOW (ref >90)
Glucose, Bld: 231 mg/dL — ABNORMAL HIGH (ref 70–99)
Potassium: 3.8 mEq/L (ref 3.5–5.1)
Sodium: 134 mEq/L — ABNORMAL LOW (ref 135–145)
Total Bilirubin: 1.2 mg/dL (ref 0.3–1.2)
Total Protein: 7.1 g/dL (ref 6.0–8.3)

## 2013-07-21 LAB — ABO/RH: ABO/RH(D): A POS

## 2013-07-21 LAB — MRSA PCR SCREENING: MRSA by PCR: NEGATIVE

## 2013-07-21 LAB — PROTIME-INR
INR: 1.08 (ref 0.00–1.49)
Prothrombin Time: 13.8 seconds (ref 11.6–15.2)

## 2013-07-21 LAB — TROPONIN I
Troponin I: 0.33 ng/mL (ref <0.30)
Troponin I: 0.34 ng/mL (ref <0.30)

## 2013-07-21 MED ORDER — SODIUM CHLORIDE 0.9 % IV BOLUS (SEPSIS)
1000.0000 mL | Freq: Once | INTRAVENOUS | Status: AC
  Administered 2013-07-21: 1000 mL via INTRAVENOUS

## 2013-07-21 MED ORDER — ASPIRIN 81 MG PO CHEW
81.0000 mg | CHEWABLE_TABLET | Freq: Every day | ORAL | Status: DC
  Filled 2013-07-21: qty 1

## 2013-07-21 MED ORDER — ADULT MULTIVITAMIN W/MINERALS CH
1.0000 | ORAL_TABLET | Freq: Every day | ORAL | Status: DC
  Filled 2013-07-21 (×2): qty 1

## 2013-07-21 MED ORDER — ATORVASTATIN CALCIUM 40 MG PO TABS
40.0000 mg | ORAL_TABLET | Freq: Every day | ORAL | Status: DC
  Filled 2013-07-21 (×2): qty 1

## 2013-07-21 MED ORDER — ENOXAPARIN SODIUM 30 MG/0.3ML ~~LOC~~ SOLN
30.0000 mg | SUBCUTANEOUS | Status: DC
  Administered 2013-07-21 – 2013-07-23 (×3): 30 mg via SUBCUTANEOUS
  Filled 2013-07-21 (×4): qty 0.3

## 2013-07-21 MED ORDER — ACETAMINOPHEN 650 MG RE SUPP
650.0000 mg | Freq: Four times a day (QID) | RECTAL | Status: DC | PRN
  Administered 2013-07-30 – 2013-07-31 (×3): 650 mg via RECTAL
  Filled 2013-07-21 (×3): qty 1

## 2013-07-21 MED ORDER — METOPROLOL TARTRATE 50 MG PO TABS
50.0000 mg | ORAL_TABLET | Freq: Two times a day (BID) | ORAL | Status: DC
  Filled 2013-07-21 (×3): qty 1

## 2013-07-21 MED ORDER — PIPERACILLIN-TAZOBACTAM 3.375 G IVPB 30 MIN
3.3750 g | Freq: Once | INTRAVENOUS | Status: AC
  Administered 2013-07-21: 3.375 g via INTRAVENOUS
  Filled 2013-07-21: qty 50

## 2013-07-21 MED ORDER — ONDANSETRON HCL 4 MG PO TABS: 4.0000 mg | ORAL_TABLET | Freq: Four times a day (QID) | ORAL | Status: DC | PRN

## 2013-07-21 MED ORDER — MULTIVITAMINS PO CAPS
1.0000 | ORAL_CAPSULE | Freq: Every day | ORAL | Status: DC
Start: 2013-07-21 — End: 2013-07-21

## 2013-07-21 MED ORDER — DICLOFENAC SODIUM 1 % TD GEL
2.0000 g | Freq: Four times a day (QID) | TRANSDERMAL | Status: DC | PRN
  Filled 2013-07-21: qty 100

## 2013-07-21 MED ORDER — PROMETHAZINE HCL 25 MG/ML IJ SOLN
6.2500 mg | Freq: Four times a day (QID) | INTRAMUSCULAR | Status: DC | PRN
  Filled 2013-07-21: qty 1

## 2013-07-21 MED ORDER — NIACIN ER (ANTIHYPERLIPIDEMIC) 750 MG PO TBCR
750.0000 mg | EXTENDED_RELEASE_TABLET | Freq: Every day | ORAL | Status: DC
  Filled 2013-07-21 (×2): qty 1

## 2013-07-21 MED ORDER — ONDANSETRON HCL 4 MG/2ML IJ SOLN: 4.0000 mg | Freq: Four times a day (QID) | INTRAMUSCULAR | Status: DC | PRN

## 2013-07-21 MED ORDER — SODIUM CHLORIDE 0.9 % IJ SOLN
3.0000 mL | Freq: Two times a day (BID) | INTRAMUSCULAR | Status: DC
  Administered 2013-07-21 – 2013-07-27 (×8): 3 mL via INTRAVENOUS

## 2013-07-21 MED ORDER — TAMSULOSIN HCL 0.4 MG PO CAPS
0.4000 mg | ORAL_CAPSULE | Freq: Every day | ORAL | Status: DC
  Filled 2013-07-21 (×2): qty 1

## 2013-07-21 MED ORDER — ONDANSETRON HCL 4 MG/2ML IJ SOLN
4.0000 mg | Freq: Four times a day (QID) | INTRAMUSCULAR | Status: DC | PRN
  Administered 2013-07-21: 4 mg via INTRAVENOUS
  Filled 2013-07-21: qty 2

## 2013-07-21 MED ORDER — FOLIC ACID 1 MG PO TABS
1.0000 mg | ORAL_TABLET | Freq: Every day | ORAL | Status: DC
  Filled 2013-07-21 (×2): qty 1

## 2013-07-21 MED ORDER — BOOST PLUS PO LIQD
237.0000 mL | Freq: Three times a day (TID) | ORAL | Status: DC
  Filled 2013-07-21 (×7): qty 237

## 2013-07-21 MED ORDER — MORPHINE SULFATE 2 MG/ML IJ SOLN
1.0000 mg | INTRAMUSCULAR | Status: DC | PRN
  Administered 2013-07-21 – 2013-07-28 (×3): 2 mg via INTRAVENOUS
  Filled 2013-07-21 (×3): qty 1

## 2013-07-21 MED ORDER — INSULIN ASPART 100 UNIT/ML ~~LOC~~ SOLN: 0.0000 [IU] | Freq: Every day | SUBCUTANEOUS | Status: DC

## 2013-07-21 MED ORDER — SODIUM CHLORIDE 0.9 % IV SOLN
INTRAVENOUS | Status: AC
  Administered 2013-07-21: 23:00:00 via INTRAVENOUS
  Administered 2013-07-22: 110 mL/h via INTRAVENOUS
  Administered 2013-07-22: 150 mL/h via INTRAVENOUS
  Administered 2013-07-23 – 2013-07-24 (×4): via INTRAVENOUS

## 2013-07-21 MED ORDER — INSULIN ASPART 100 UNIT/ML ~~LOC~~ SOLN
0.0000 [IU] | Freq: Three times a day (TID) | SUBCUTANEOUS | Status: DC
  Administered 2013-07-22 (×3): 1 [IU] via SUBCUTANEOUS

## 2013-07-21 MED ORDER — PIPERACILLIN-TAZOBACTAM IN DEX 2-0.25 GM/50ML IV SOLN
2.2500 g | Freq: Three times a day (TID) | INTRAVENOUS | Status: DC
  Administered 2013-07-21 – 2013-07-24 (×8): 2.25 g via INTRAVENOUS
  Filled 2013-07-21 (×10): qty 50

## 2013-07-21 MED ORDER — CLOPIDOGREL BISULFATE 75 MG PO TABS
75.0000 mg | ORAL_TABLET | Freq: Every day | ORAL | Status: DC
  Filled 2013-07-21 (×2): qty 1

## 2013-07-21 MED ORDER — PANTOPRAZOLE SODIUM 40 MG IV SOLR
40.0000 mg | Freq: Two times a day (BID) | INTRAVENOUS | Status: DC
  Administered 2013-07-21 – 2013-07-31 (×19): 40 mg via INTRAVENOUS
  Filled 2013-07-21 (×29): qty 40

## 2013-07-21 MED ORDER — ACETAMINOPHEN 325 MG PO TABS: 650.0000 mg | ORAL_TABLET | Freq: Four times a day (QID) | ORAL | Status: DC | PRN

## 2013-07-21 NOTE — Assessment & Plan Note (Signed)
Chest exam benign, will need at least CXR, ? Possibility of PE

## 2013-07-21 NOTE — Consult Note (Signed)
Patient examined and I agree with the assessment and plan. Multiple medical issues including likely PNA, AKI, and elevated troponin. Hernia is reduced and can be fixed later. We will follow.  Violeta Gelinas, MD, MPH, FACS Pager: [phone]  07/21/2013 4:46 PM

## 2013-07-21 NOTE — Consult Note (Signed)
Reason for Consult: abnormal troponin   Referring Physician: ER MD PCP: Oliver Barre, MD Primary Cardiologist:Dr. Carmelia Bake is an 72 y.o. male.    Chief Complaint: indigestion for 4-6 days now Troponin marker elevated   HPI: 72 year old male with established coronary as well a cerebral vascular disease. In 1997 he underwent CABG revascularization surgery x6 by Dr. Nydia Bouton and had a LIMA placed to the LAD, sequential vein to the intermediate and second diagonal, vein to the OM 2, sequential vein to the acute margin and PDA. 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 A nuclear perfusion study in March 2012 is unchanged and show previously noted inferior scar. An echo Doppler study in May 2013 showed mild asymmetric left ventricular hypertrophy with an ejection fraction of 50-55%. There was mild posterior wall hypokinesis with mild inferior wall hypokinesis. He had moderate mitral regurgitation and mild aortic valve sclerosis with mild tricuspid insufficiency.   He presented to his PCP today with complaints of intermittent right groin pain, and hoarseness. In Dr. Raphael Gibney office he was hypotensive, clammy, wheezing,diaphoretic and weak. Right groin was tender, but no swelling noted. BP 86/60with a HR of 97. He was sent to the ER from Dr. Raphael Gibney office. Here in the ER it is difficult to get a history, or even chief complaint.  CT of the chest, abdomen and pelvis without IV contrast. This shows :1. Right inguinal hernia with herniation of the distal small bowel loop with severe obstruction of the small bowel. This results in severe distention of the small bowel and severe distention of the stomach. The esophagus is also severely distended. There is also a left inguinal hernia with herniation of fat only. Bilateral hydroceles are also noted. 2. Patchy bilateral pulmonary infiltrates consistent with pneumonitis possibly from aspiration. 3. Prior CABG.  Labs show acute  renal failure with a creatinine of 3.6, (creatinine was normal 1.1 04/18/13) tropin slightly elevated at 0.21. H/H 17.3/51. WBC 7.3.   He is improving with BP now 108/67 after receiving a fluid bolus.  Troponin POC mildly elevated but troponin I negative. He tells me he has had indigestion since the weekend with no relief from belching or antacids.  He thinks his stomach is enlarged but then he stated he was not sure.  No acute changes on EKG.  Currently he is comfortable.      Past Medical History  Diagnosis Date  . Hyperlipidemia   . Hypertension   . Hepatitis   . Obesity, morbid   . Benign prostatic hypertrophy   . Cataracts, bilateral   . Carpal tunnel syndrome, bilateral   . Diverticulosis of colon   . Nephrolithiasis   . Low back pain   . ALLERGIC RHINITIS   . Head mass   . Diabetes mellitus type II   . Coronary artery disease     Dr. Tresa Endo; 2D ECHO, 12/11/2011 - EF 50-55%, normal; NUCLEAR STRESS TEST, 10/16/2010 - no evidemce of inducible ischemia  . 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 Last nuc 2012 negative. 07/21/2013  . Inguinal hernia, Rt reduced, Lt present 07/21/2013    Past Surgical History  Procedure Laterality Date  . Appendectomy    . Coronary artery bypass graft    .  Cardiac catheterization      Family History  Problem Relation Age of Onset  . Heart attack Mother   . Alzheimer's disease Father   . Heart attack Brother   . Heart attack Maternal Grandmother   . Hypertension Maternal Grandmother   . Cancer Brother     Pancreatic  . Kidney disease Brother   . Hypertension Son   . Kidney disease Son    Social History:  reports that he has quit smoking. He does not have any smokeless tobacco history on file. He reports that he does not drink alcohol or use illicit drugs.  Allergies:  No Known Allergies  OUTPATIENT Medications:  Tylenol Ascorbic acid 1000mg   Asa 81 mg daily lipitor 40 mg daily plavix 75 mg daily voltaren gel Losartan/hctz 100/12.5 daily Lopressor 50 mg BID Multi vit daily niaspan 750 daily Omega 3 360 mg bid flomax 0.4 mg daily  Results for orders placed during the hospital encounter of 07/21/13 (from the past 48 hour(s))  GLUCOSE, CAPILLARY     Status: Abnormal   Collection Time    07/21/13 12:41 PM      Result Value Range   Glucose-Capillary 185 (*) 70 - 99 mg/dL  COMPREHENSIVE METABOLIC PANEL     Status: Abnormal   Collection Time    07/21/13 12:51 PM      Result Value Range   Sodium 134 (*) 135 - 145 mEq/L   Potassium 3.8  3.5 - 5.1 mEq/L   Chloride 86 (*) 96 - 112 mEq/L   CO2 25  19 - 32 mEq/L   Glucose, Bld 231 (*) 70 - 99 mg/dL   BUN 66 (*) 6 - 23 mg/dL   Creatinine, Ser 4.54 (*) 0.50 - 1.35 mg/dL   Calcium 9.7  8.4 - 09.8 mg/dL   Total Protein 7.1  6.0 - 8.3 g/dL   Albumin 4.0  3.5 - 5.2 g/dL   AST 30  0 - 37 U/L   ALT 22  0 - 53 U/L   Alkaline Phosphatase 63  39 - 117 U/L   Total Bilirubin 1.2  0.3 - 1.2 mg/dL   GFR calc non Af Amer 15 (*) >90 mL/min   GFR calc Af Amer 18 (*) >90 mL/min   Comment: (NOTE)     The eGFR has been calculated using the CKD EPI equation.     This calculation has not been validated in all clinical situations.     eGFR's persistently <90 mL/min signify possible Chronic Kidney     Disease.  CBC WITH DIFFERENTIAL     Status: Abnormal   Collection Time    07/21/13 12:51 PM      Result Value Range   WBC 7.3  4.0 - 10.5 K/uL   RBC 4.91  4.22 - 5.81 MIL/uL   Hemoglobin 17.1 (*) 13.0 - 17.0 g/dL   HCT 11.9  14.7 - 82.9 %   MCV 95.5  78.0 - 100.0 fL   MCH 34.8 (*) 26.0 - 34.0 pg   MCHC 36.5 (*) 30.0 - 36.0 g/dL   RDW 56.2  13.0 - 86.5 %   Platelets 228  150 - 400 K/uL   Neutrophils Relative % 79 (*) 43 - 77 %   Neutro Abs 5.8  1.7 - 7.7 K/uL   Lymphocytes Relative 11 (*) 12 - 46 %   Lymphs  Abs 0.8  0.7 - 4.0 K/uL   Monocytes Relative 10  3 - 12 %  Monocytes Absolute 0.7  0.1 - 1.0 K/uL   Eosinophils Relative 0  0 - 5 %   Eosinophils Absolute 0.0  0.0 - 0.7 K/uL   Basophils Relative 0  0 - 1 %   Basophils Absolute 0.0  0.0 - 0.1 K/uL  PROTIME-INR     Status: None   Collection Time    07/21/13 12:51 PM      Result Value Range   Prothrombin Time 13.8  11.6 - 15.2 seconds   INR 1.08  0.00 - 1.49  TROPONIN I     Status: None   Collection Time    07/21/13 12:51 PM      Result Value Range   Troponin I <0.30  <0.30 ng/mL   Comment:            Due to the release kinetics of cTnI,     a negative result within the first hours     of the onset of symptoms does not rule out     myocardial infarction with certainty.     If myocardial infarction is still suspected,     repeat the test at appropriate intervals.  TYPE AND SCREEN     Status: None   Collection Time    07/21/13 12:55 PM      Result Value Range   ABO/RH(D) A POS     Antibody Screen NEG     Sample Expiration 07/24/2013    ABO/RH     Status: None   Collection Time    07/21/13 12:55 PM      Result Value Range   ABO/RH(D) A POS    POCT I-STAT TROPONIN I     Status: Abnormal   Collection Time    07/21/13  1:16 PM      Result Value Range   Troponin i, poc 0.21 (*) 0.00 - 0.08 ng/mL   Comment NOTIFIED PHYSICIAN     Comment 3            Comment: Due to the release kinetics of cTnI,     a negative result within the first hours     of the onset of symptoms does not rule out     myocardial infarction with certainty.     If myocardial infarction is still suspected,     repeat the test at appropriate intervals.  POCT I-STAT, CHEM 8     Status: Abnormal   Collection Time    07/21/13  1:18 PM      Result Value Range   Sodium 133 (*) 135 - 145 mEq/L   Potassium 3.6  3.5 - 5.1 mEq/L   Chloride 91 (*) 96 - 112 mEq/L   BUN 60 (*) 6 - 23 mg/dL   Creatinine, Ser 1.19 (*) 0.50 - 1.35 mg/dL   Glucose, Bld 147 (*) 70 -  99 mg/dL   Calcium, Ion 8.29 (*) 1.13 - 1.30 mmol/L   TCO2 27  0 - 100 mmol/L   Hemoglobin 17.3 (*) 13.0 - 17.0 g/dL   HCT 56.2  13.0 - 86.5 %   Ct Abdomen Pelvis Wo Contrast  07/21/2013   CLINICAL DATA:  Chest pain.  Shortness of breath.  EXAM: CT CHEST WITHOUT CONTRAST; CT ABDOMEN AND PELVIS WITHOUT CONTRAST  TECHNIQUE: Multidetector CT imaging of the chest was performed following the standard protocol without IV contrast.; Multidetector CT imaging of the abdomen and pelvis was performed following the standard protocol without intravenous contrast.  COMPARISON:  None.  FINDINGS: CT chest:  No evidence of mediastinal or hilar adenopathy. The thoracic esophagus is severely dilated. A hiatal hernia is present. The hiatal hernia is fluid filled. There is mild thickening of the gastroesophageal junction. Prior CABG. Heart size normal. Large airways are patent. Focal bilateral small ill-defined pulmonary infiltrates are present throughout both lung fields. Although metastatic disease cannot be excluded, these are most likely infectious. Typical and atypical infection including granulomatous disease should be considered. Multifocal small areas of aspiration could present in this fashion .  CT abdomen pelvis:  Liver is normal. Spleen is normal. Pancreas is normal. Gallbladder is normal. No biliary distention.  Adrenals normal. Kidneys normal. No hydronephrosis. No evidence of obstructing ureteral stone. Bladder is nondistended. Mild prostate enlargement. Phleboliths in pelvis.  Right inguinal hernia is present with herniation of what appears to be a distal loop of small bowel. Proximal bowel severe distention is present. Distal bowel is collapsed. Associated gastric distension is present. As noted above esophagus is distended. These findings are consistent with severe distal small bowel obstruction secondary to herniation into the inguinal canal on the right. Bilateral hydroceles are noted. There is a left  inguinal hernia left with herniation of fat only. Appendix not definitely identified. Sigmoid colonic diverticulosis. No evidence of diverticulitis. No pneumatosis or free air identified.  No adenopathy. Atherosclerotic vascular changes noted of the abdominal aorta.  No acute bony abnormality identified. Thoracolumbar degenerative changes i present.  IMPRESSION: 1. Right inguinal hernia with herniation of the distal small bowel loop with severe obstruction of the small bowel. This results in severe distention of the small bowel and severe distention of the stomach. The esophagus is also severely distended. There is also a left inguinal hernia with herniation of fat only. Bilateral hydroceles are also noted. 2. Patchy bilateral pulmonary infiltrates consistent with pneumonitis possibly from aspiration. 3. Prior CABG. These results were called by telephone at the time of interpretation on 07/21/2013 at 2:32 PM to Dr. Raeford Razor , who verbally acknowledged these results.   Electronically Signed   By: Maisie Fus  Register   On: 07/21/2013 14:35   Ct Chest Wo Contrast  07/21/2013   CLINICAL DATA:  Chest pain.  Shortness of breath.  EXAM: CT CHEST WITHOUT CONTRAST; CT ABDOMEN AND PELVIS WITHOUT CONTRAST  TECHNIQUE: Multidetector CT imaging of the chest was performed following the standard protocol without IV contrast.; Multidetector CT imaging of the abdomen and pelvis was performed following the standard protocol without intravenous contrast.  COMPARISON:  None.  FINDINGS: CT chest:  No evidence of mediastinal or hilar adenopathy. The thoracic esophagus is severely dilated. A hiatal hernia is present. The hiatal hernia is fluid filled. There is mild thickening of the gastroesophageal junction. Prior CABG. Heart size normal. Large airways are patent. Focal bilateral small ill-defined pulmonary infiltrates are present throughout both lung fields. Although metastatic disease cannot be excluded, these are most likely  infectious. Typical and atypical infection including granulomatous disease should be considered. Multifocal small areas of aspiration could present in this fashion .  CT abdomen pelvis:  Liver is normal. Spleen is normal. Pancreas is normal. Gallbladder is normal. No biliary distention.  Adrenals normal. Kidneys normal. No hydronephrosis. No evidence of obstructing ureteral stone. Bladder is nondistended. Mild prostate enlargement. Phleboliths in pelvis.  Right inguinal hernia is present with herniation of what appears to be a distal loop of small bowel. Proximal bowel severe distention is present. Distal bowel is collapsed. Associated gastric distension is present. As noted above  esophagus is distended. These findings are consistent with severe distal small bowel obstruction secondary to herniation into the inguinal canal on the right. Bilateral hydroceles are noted. There is a left inguinal hernia left with herniation of fat only. Appendix not definitely identified. Sigmoid colonic diverticulosis. No evidence of diverticulitis. No pneumatosis or free air identified.  No adenopathy. Atherosclerotic vascular changes noted of the abdominal aorta.  No acute bony abnormality identified. Thoracolumbar degenerative changes i present.  IMPRESSION: 1. Right inguinal hernia with herniation of the distal small bowel loop with severe obstruction of the small bowel. This results in severe distention of the small bowel and severe distention of the stomach. The esophagus is also severely distended. There is also a left inguinal hernia with herniation of fat only. Bilateral hydroceles are also noted. 2. Patchy bilateral pulmonary infiltrates consistent with pneumonitis possibly from aspiration. 3. Prior CABG. These results were called by telephone at the time of interpretation on 07/21/2013 at 2:32 PM to Dr. Raeford Razor , who verbally acknowledged these results.   Electronically Signed   By: Maisie Fus  Register   On: 07/21/2013  14:35   Dg Chest Portable 1 View  07/21/2013   CLINICAL DATA:  Short of breath.  Chest pain.  EXAM: PORTABLE CHEST - 1 VIEW  COMPARISON:  None.  FINDINGS: Low volume chest. CABG. Cardiopericardial silhouette appears within normal limits. Artifact projects over the upper chest. Monitoring leads project over the chest. No airspace disease. No effusion. Subsegmental scarring in the lingula.  Retrocardiac shadow is present, likely representing fluid distended esophagus performed on contemporaneously CT.  IMPRESSION: Low volume chest. No active cardiopulmonary disease. CABG. Retrocardiac shadow likely represents fluid-filled esophagus demonstrated on CT.   Electronically Signed   By: Andreas Newport M.D.   On: 07/21/2013 14:04    ROS: General:no colds or fevers, no weight changes Skin:no rashes or ulcers HEENT:no blurred vision, no congestion CV:see HPI PUL:see HPI + cough and some SOb GI:no diarrhea, + constipation nor melena, + indigestion see HPI, + nausea + vomiting X 2 GU:no hematuria, no dysuria MS:no joint pain, no claudication Neuro:no syncope, no lightheadedness Endo:no diabetes, no thyroid disease   Blood pressure 100/52, temperature 97.1 F (36.2 C), temperature source Rectal, resp. rate 19, SpO2 99.00%. PE: General:Pleasant affect, NAD, poor historian Skin:Warm and dry, brisk capillary refill HEENT:normocephalic, sclera clear, mucus membranes moist Neck:supple, no JVD, no bruits  Heart:S1S2 RRR with soft systolic murmur, no gallup, rub or click Lungs:clear without rales, rhonchi, or wheezes WUJ:WJXB, non tender, + distention, few BS, do not palpate liver spleen or masses Ext:no lower ext edema, 2+ pedal pulses, 2+ radial pulses Neuro:alert and oriented, MAE, follows commands, + facial symmetry    Assessment/Plan Principal Problem:   SBO (small bowel obstruction) Active Problems:   Acute renal failure   Morbid obesity   Hypotension, unspecified   Elevated troponin I  level   CAD (coronary artery disease), hx of CABG 1997 X 6. Last nuc 2012 negative.   Inguinal hernia, Rt reduced, Lt present  PLAN: Dr. Rennis Golden and myself have seen and evaluated the pt.  He is currently improved and in no acute distress.  We will check echo.  If he needs surgery he would need lexiscan myoview first.  Troponin POC marker elevated but troponin neg.  Will follow serial troponins.  If become more  Significantly positive then would begin Heparin.   [facility]  Nurse Practitioner Certified [facility], [facility] Pager [phone] or after 5pm  or weekends call [phone] 07/21/2013, 5:37 PM

## 2013-07-21 NOTE — ED Notes (Signed)
i stat troponin reported to [place], [place]

## 2013-07-21 NOTE — ED Provider Notes (Addendum)
CSN: [id number]     Arrival date & time 07/21/13  1218 History   First MD Initiated Contact with Patient 07/21/13 1250     Chief Complaint  Patient presents with  . Shortness of Breath   (Consider location/radiation/quality/duration/timing/severity/associated sxs/prior Treatment) HPI  A 72 year old male referred from PCP for further evaluation of hypotension, generalized fatigue, dyspnea and diaphoresis. The patient is not a particularly good historian. He does endorse generalized fatigue over the past week with anorexia. Seemed very dyspneic in Dr Raphael Gibney office. He does have some pain in his right groin at the location of right inguinal hernia. Denies any pain anywhere else. He is having some nausea, but no vomiting. Last bowel movement approximately 3 days ago. No fevers or chills. No urinary complaints.   Past Medical History  Diagnosis Date  . Hyperlipidemia   . Hypertension   . Hepatitis   . Obesity, morbid   . Benign prostatic hypertrophy   . Cataracts, bilateral   . Carpal tunnel syndrome, bilateral   . Diverticulosis of colon   . Nephrolithiasis   . Low back pain   . ALLERGIC RHINITIS   . Head mass   . Diabetes mellitus type II   . Coronary artery disease     Dr. Tresa Endo; 2D ECHO, 12/11/2011 - EF 50-55%, normal; NUCLEAR STRESS TEST, 10/16/2010 - no evidemce of inducible ischemia  . 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 Appendectomy    . Coronary artery bypass graft     Family History  Problem Relation Age of Onset  . Heart attack Mother   . Alzheimer's disease Father   . Heart attack Brother   . Heart attack Maternal Grandmother   . Hypertension Maternal Grandmother   . Cancer Brother     Pancreatic  . Kidney disease Brother   . Hypertension Son   . Kidney disease Son    History  Substance  Use Topics  . Smoking status: Former Games developer  . Smokeless tobacco: Not on file  . Alcohol Use: No    Review of Systems  All systems reviewed and negative, other than as noted in HPI.   Allergies  Review of patient's allergies indicates no known allergies.  Home Medications   Current Outpatient Rx  Name  Route  Sig  Dispense  Refill  . acetaminophen (TYLENOL) 325 MG tablet   Oral   Take 325 mg by mouth every 6 (six) hours as needed for mild pain.         . Ascorbic Acid (VITAMIN C) 1000 MG tablet   Oral   Take 1,000 mg by mouth daily.         . Aspirin (ADULT ASPIRIN LOW STRENGTH) 81 MG EC tablet   Oral   Take 81 mg by mouth daily.           Marland Kitchen atorvastatin (LIPITOR) 40 MG tablet   Oral   Take 1 tablet (40 mg total) by mouth daily.   90 tablet   3   . clopidogrel (PLAVIX) 75 MG tablet   Oral   Take 1 tablet (75 mg total) by mouth daily.   90 tablet   3   . diclofenac sodium (VOLTAREN) 1 % GEL   Topical   Apply 2 g topically 4 (four) times daily as needed (for arthritis).         Marland Kitchen  folic acid (FOLVITE) 1 MG tablet   Oral   Take 1 tablet (1 mg total) by mouth daily.   90 tablet   2   . losartan-hydrochlorothiazide (HYZAAR) 100-12.5 MG per tablet   Oral   Take 1 tablet by mouth daily.   90 tablet   3   . metoprolol (LOPRESSOR) 50 MG tablet   Oral   Take 1 tablet (50 mg total) by mouth 2 (two) times daily.   180 tablet   3   . Multiple Vitamin (MULTIVITAMIN) capsule   Oral   Take 1 capsule by mouth daily.          . niacin (NIASPAN) 750 MG CR tablet   Oral   Take 750 mg by mouth at bedtime.         . Omega-3 Fatty Acids (OMEGA 3 PO)   Oral   Take 360 mg by mouth 2 (two) times daily.         . tamsulosin (FLOMAX) 0.4 MG CAPS capsule   Oral   Take 1 capsule (0.4 mg total) by mouth daily.   90 capsule   3    BP 108/67  Temp(Src) 97.1 F (36.2 C) (Rectal)  Resp 19  SpO2 99% Physical Exam  Nursing note and vitals  reviewed. Constitutional: He appears well-developed and well-nourished. No distress.  Laying in bed. Diaphoretic. Appears very tired.   HENT:  Head: Normocephalic and atraumatic.  Eyes: Conjunctivae are normal. Pupils are equal, round, and reactive to light. Right eye exhibits no discharge. Left eye exhibits no discharge.  Neck: Neck supple.  Cardiovascular: Normal rate, regular rhythm and normal heart sounds.  Exam reveals no gallop and no friction rub.   No murmur heard. Pulmonary/Chest: Effort normal and breath sounds normal. No respiratory distress.  Abdominal: Soft. He exhibits distension. There is no tenderness.  RLQ surgical scar. R inguinal hernia. Unable to fully reduce contents. No significant tenderness though. No overlying skin changes.   Musculoskeletal: He exhibits no edema and no tenderness.  Lower extremities symmetric as compared to each other. No calf tenderness. Negative Homan's. No palpable cords.   Neurological:  Patient is drowsy, and somewhat slow to respond to questioning but he is responding appropriately. He is oriented to himself, place and time. Cranial nerves are intact. Strength is 5 out of 5 bilateral upper and lower extremities.  Skin: Skin is dry.  Extremities cool to touch.   Psychiatric: He has a normal mood and affect. His behavior is normal. Thought content normal.    ED Course  Procedures (including critical care time)  CRITICAL CARE Performed by: Raeford Razor  Total critical care time: 40 minutes  Critical care time was exclusive of separately billable procedures and treating other patients. Critical care was necessary to treat or prevent imminent or life-threatening deterioration. Critical care was time spent personally by me on the following activities: development of treatment plan with patient and/or surrogate as well as nursing, discussions with consultants, evaluation of patient's response to treatment, examination of patient, obtaining  history from patient or surrogate, ordering and performing treatments and interventions, ordering and review of laboratory studies, ordering and review of radiographic studies, pulse oximetry and re-evaluation of patient's condition.  Labs Review Labs Reviewed  COMPREHENSIVE METABOLIC PANEL - Abnormal; Notable for the following:    Sodium 134 (*)    Chloride 86 (*)    Glucose, Bld 231 (*)    BUN 66 (*)    Creatinine, Ser 3.59 (*)  GFR calc non Af Amer 15 (*)    GFR calc Af Amer 18 (*)    All other components within normal limits  CBC WITH DIFFERENTIAL - Abnormal; Notable for the following:    Hemoglobin 17.1 (*)    MCH 34.8 (*)    MCHC 36.5 (*)    Neutrophils Relative % 79 (*)    Lymphocytes Relative 11 (*)    All other components within normal limits  GLUCOSE, CAPILLARY - Abnormal; Notable for the following:    Glucose-Capillary 185 (*)    All other components within normal limits  POCT I-STAT, CHEM 8 - Abnormal; Notable for the following:    Sodium 133 (*)    Chloride 91 (*)    BUN 60 (*)    Creatinine, Ser 3.60 (*)    Glucose, Bld 226 (*)    Calcium, Ion 1.09 (*)    Hemoglobin 17.3 (*)    All other components within normal limits  POCT I-STAT TROPONIN I - Abnormal; Notable for the following:    Troponin i, poc 0.21 (*)    All other components within normal limits  PROTIME-INR  TROPONIN I  TYPE AND SCREEN  ABO/RH   Imaging Review Ct Abdomen Pelvis Wo Contrast  07/21/2013   CLINICAL DATA:  Chest pain.  Shortness of breath.  EXAM: CT CHEST WITHOUT CONTRAST; CT ABDOMEN AND PELVIS WITHOUT CONTRAST  TECHNIQUE: Multidetector CT imaging of the chest was performed following the standard protocol without IV contrast.; Multidetector CT imaging of the abdomen and pelvis was performed following the standard protocol without intravenous contrast.  COMPARISON:  None.  FINDINGS: CT chest:  No evidence of mediastinal or hilar adenopathy. The thoracic esophagus is severely dilated. A  hiatal hernia is present. The hiatal hernia is fluid filled. There is mild thickening of the gastroesophageal junction. Prior CABG. Heart size normal. Large airways are patent. Focal bilateral small ill-defined pulmonary infiltrates are present throughout both lung fields. Although metastatic disease cannot be excluded, these are most likely infectious. Typical and atypical infection including granulomatous disease should be considered. Multifocal small areas of aspiration could present in this fashion .  CT abdomen pelvis:  Liver is normal. Spleen is normal. Pancreas is normal. Gallbladder is normal. No biliary distention.  Adrenals normal. Kidneys normal. No hydronephrosis. No evidence of obstructing ureteral stone. Bladder is nondistended. Mild prostate enlargement. Phleboliths in pelvis.  Right inguinal hernia is present with herniation of what appears to be a distal loop of small bowel. Proximal bowel severe distention is present. Distal bowel is collapsed. Associated gastric distension is present. As noted above esophagus is distended. These findings are consistent with severe distal small bowel obstruction secondary to herniation into the inguinal canal on the right. Bilateral hydroceles are noted. There is a left inguinal hernia left with herniation of fat only. Appendix not definitely identified. Sigmoid colonic diverticulosis. No evidence of diverticulitis. No pneumatosis or free air identified.  No adenopathy. Atherosclerotic vascular changes noted of the abdominal aorta.  No acute bony abnormality identified. Thoracolumbar degenerative changes i present.  IMPRESSION: 1. Right inguinal hernia with herniation of the distal small bowel loop with severe obstruction of the small bowel. This results in severe distention of the small bowel and severe distention of the stomach. The esophagus is also severely distended. There is also a left inguinal hernia with herniation of fat only. Bilateral hydroceles are  also noted. 2. Patchy bilateral pulmonary infiltrates consistent with pneumonitis possibly from aspiration. 3. Prior CABG. These  results were called by telephone at the time of interpretation on 07/21/2013 at 2:32 PM to Dr. Raeford Razor , who verbally acknowledged these results.   Electronically Signed   By: Maisie Fus  Register   On: 07/21/2013 14:35   Ct Chest Wo Contrast  07/21/2013   CLINICAL DATA:  Chest pain.  Shortness of breath.  EXAM: CT CHEST WITHOUT CONTRAST; CT ABDOMEN AND PELVIS WITHOUT CONTRAST  TECHNIQUE: Multidetector CT imaging of the chest was performed following the standard protocol without IV contrast.; Multidetector CT imaging of the abdomen and pelvis was performed following the standard protocol without intravenous contrast.  COMPARISON:  None.  FINDINGS: CT chest:  No evidence of mediastinal or hilar adenopathy. The thoracic esophagus is severely dilated. A hiatal hernia is present. The hiatal hernia is fluid filled. There is mild thickening of the gastroesophageal junction. Prior CABG. Heart size normal. Large airways are patent. Focal bilateral small ill-defined pulmonary infiltrates are present throughout both lung fields. Although metastatic disease cannot be excluded, these are most likely infectious. Typical and atypical infection including granulomatous disease should be considered. Multifocal small areas of aspiration could present in this fashion .  CT abdomen pelvis:  Liver is normal. Spleen is normal. Pancreas is normal. Gallbladder is normal. No biliary distention.  Adrenals normal. Kidneys normal. No hydronephrosis. No evidence of obstructing ureteral stone. Bladder is nondistended. Mild prostate enlargement. Phleboliths in pelvis.  Right inguinal hernia is present with herniation of what appears to be a distal loop of small bowel. Proximal bowel severe distention is present. Distal bowel is collapsed. Associated gastric distension is present. As noted above esophagus is  distended. These findings are consistent with severe distal small bowel obstruction secondary to herniation into the inguinal canal on the right. Bilateral hydroceles are noted. There is a left inguinal hernia left with herniation of fat only. Appendix not definitely identified. Sigmoid colonic diverticulosis. No evidence of diverticulitis. No pneumatosis or free air identified.  No adenopathy. Atherosclerotic vascular changes noted of the abdominal aorta.  No acute bony abnormality identified. Thoracolumbar degenerative changes i present.  IMPRESSION: 1. Right inguinal hernia with herniation of the distal small bowel loop with severe obstruction of the small bowel. This results in severe distention of the small bowel and severe distention of the stomach. The esophagus is also severely distended. There is also a left inguinal hernia with herniation of fat only. Bilateral hydroceles are also noted. 2. Patchy bilateral pulmonary infiltrates consistent with pneumonitis possibly from aspiration. 3. Prior CABG. These results were called by telephone at the time of interpretation on 07/21/2013 at 2:32 PM to Dr. Raeford Razor , who verbally acknowledged these results.   Electronically Signed   By: Maisie Fus  Register   On: 07/21/2013 14:35   Dg Chest Portable 1 View  07/21/2013   CLINICAL DATA:  Short of breath.  Chest pain.  EXAM: PORTABLE CHEST - 1 VIEW  COMPARISON:  None.  FINDINGS: Low volume chest. CABG. Cardiopericardial silhouette appears within normal limits. Artifact projects over the upper chest. Monitoring leads project over the chest. No airspace disease. No effusion. Subsegmental scarring in the lingula.  Retrocardiac shadow is present, likely representing fluid distended esophagus performed on contemporaneously CT.  IMPRESSION: Low volume chest. No active cardiopulmonary disease. CABG. Retrocardiac shadow likely represents fluid-filled esophagus demonstrated on CT.   Electronically Signed   By: Andreas Newport M.D.   On: 07/21/2013 14:04    EKG Interpretation    Date/Time:  Thursday July 21 2013  12:36:37 EST Ventricular Rate:  84 PR Interval:  142 QRS Duration: 113 QT Interval:  382 QTC Calculation: 451 R Axis:   -94 Text Interpretation:  Sinus rhythm Probable left atrial enlargement LAD Abnormal R-wave progression Consider left ventricular hypertrophy Confirmed by Juleen China  MD, Claudie Rathbone ([id number]) on 07/21/2013 4:07:03 PM            MDM   1. ARF (acute renal failure)   2. Hypotension   3. Dehydration   4. Inguinal hernia   5. Elevated troponin     72 year old male with generalized fatigue and shortness of breath. He has a R inguinal hernia. Physical exam with regard to this was fairly unremarkable.  Can partially reduce, but not tender. No skin changes.  Denies vomiting.  CT is suggestive of an acute obstruction though. General surgery was consulted. Hernia subsequently reduced.. They do plan to surgically repair this once during this hospitalization once acute medical issues have been resolved and plavix has been held. Suspect hypotension related to dehydration. Labs consistent with hemoconcentration. Acute renal failure and mid hyponatremia. Patient endorses poor PO intake recently and is on a diuretic.Hypotension improving with IV fluids. Pt reports feeling much better. Extremities now warm and better perfused.  Mildly elevated POC troponin. Known cardiac hx, but patient denies any chest pain. EKG does not show any acute changes. On plavix. Not sure of the significance of this in light of his acute kidney dysfunction. Patchy infiltrates on CT chest, question pneumonitis. Afebrile. No leukocytosis. Dyspnea, but no hypoxemia. No new cough. Clinically not pneumonia, but did get dose of zosyn as anticipated pre-op abx and this should provide adequate coverage for possible infectious etiology.    4:29 PM Surgery has evaluated the patient. Spoke with Medicine for admission because of ARF.  Medicine feels that this should be a surgical admission. Explained that although he has an inguinal hernia and CT evidence of obstruction, it is now reduced and no longer a surgical emergency. If there were no other issues then this is something that could otherwise be electively repaired.  He does have ARF that is in need of acute medical management though as well as elevated troponin. Requested that medicine service discuss with surgical service directly. Will move pt to Pod C pending resolution of admitting service.    Raeford Razor, MD 07/21/13 1654  Raeford Razor, MD 07/21/13 [phone]

## 2013-07-21 NOTE — Progress Notes (Signed)
ANTIBIOTIC CONSULT NOTE - INITIAL  Pharmacy Consult for Zosyn Indication: rule out pneumonia  No Known Allergies  Patient Measurements: Height: 5\' 6"  (167.6 cm) Weight: 227 lb 8.2 oz (103.2 kg) IBW/kg (Calculated) : 63.8   Vital Signs: Temp: 97.3 F (36.3 C) (12/18 2100) Temp src: Oral (12/18 2100) BP: 111/50 mmHg (12/18 2100) Pulse Rate: 87 (12/18 2100) Intake/Output from previous day:   Intake/Output from this shift:    Labs:  Recent Labs  07/21/13 1251 07/21/13 1318  WBC 7.3  --   HGB 17.1* 17.3*  PLT 228  --   CREATININE 3.59* 3.60*   Estimated Creatinine Clearance: 20.3 ml/min (by C-G formula based on Cr of 3.6). No results found for this basename: VANCOTROUGH, VANCOPEAK, VANCORANDOM, GENTTROUGH, GENTPEAK, GENTRANDOM, TOBRATROUGH, TOBRAPEAK, TOBRARND, AMIKACINPEAK, AMIKACINTROU, AMIKACIN,  in the last 72 hours   Microbiology: No results found for this or any previous visit (from the past 720 hour(s)).  Medical History: Past Medical History  Diagnosis Date  . Hyperlipidemia   . Hypertension   . Hepatitis   . Obesity, morbid   . Benign prostatic hypertrophy   . Cataracts, bilateral   . Carpal tunnel syndrome, bilateral   . Diverticulosis of colon   . Nephrolithiasis   . Low back pain   . ALLERGIC RHINITIS   . Head mass   . Diabetes mellitus type II   . Coronary artery disease     Dr. Tresa Endo; 2D ECHO, 12/11/2011 - EF 50-55%, normal; NUCLEAR STRESS TEST, 10/16/2010 - no evidemce of inducible ischemia  . 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 Last nuc 2012 negative. 07/21/2013  . Inguinal hernia, Rt reduced, Lt present 07/21/2013  . Stroke   . GERD (gastroesophageal reflux disease)   . H/O hiatal hernia     Assessment: 65 YOM admitted with SOB. Chest CT shows "patchy bilateral  pulmonary infiltrates consistent with pneumonitis possibly from aspiration". To start Zosyn for empiric coverage. SCr 3.6 with est CrCl ~15mL/min. No cultures sent.  Goal of Therapy:  Eradication of infection  Plan:  1. Zosyn 2.25gm IV q8h- will need to be increased if renal function improves 2. Follow up for c/s, renal function, LOT, clinical progression  Aylssa Herrig D. Nijae Doyel, PharmD, BCPS Clinical Pharmacist Pager: [phone] 07/21/2013 9:20 PM

## 2013-07-21 NOTE — ED Notes (Addendum)
Pt to ED from Dr. Melvyn Novas office for evaluation of shortness of breath for the past few days.  EMS found pt to be hypotensive 86 systolic- IV in place.  Pt speaking in full sentences.  Pt diaphoretic upon arrival to room, hands and feet cold, denies any pain- Dr. Juleen China at bedside.

## 2013-07-21 NOTE — ED Notes (Signed)
Dr. Juleen China at bedside with abdominal ultrasound.

## 2013-07-21 NOTE — H&P (Addendum)
[facility] History and Physical  TEAL BONTRAGER ZOX:[id number] DOB: 1940-10-31 DOA: 07/21/2013  Referring physician: EDP PCP: Oliver Barre, MD   Chief Complaint:   HPI: Andres [name] is a 72 y.o. male with multiple medical problems as listed below who initially presented at  his PCP's office with a 1week h/o intermittent right groin pain (has known small RIH in past), as well as 2-3 days onset hoarseness, was dyspneic with some difficulty speaking complete sentences, diaphoretic and with clammy extremities. He was found to be hypotensive at the office and sent to the EDP. At the time of my visit pt admits to nausea and vomiting x2 over the past week and his last BM was 2days ago. Pt also reports a cough productive of phlegm. He admits to intermittent chest pressure for the past 2days- and states that it feels like he needs to belch and was relieved some by tums/peptobismol. He was seen in ED and abd CT showed Right inguinal hernia with herniation of the distal small bowel loop with severe obstruction of the small bowel.also a left inguinal hernia with herniation of fat only. Bilateral hydroceles are also noted. Patchy bilateral pulmonary infiltrates consistent with pneumonitis possibly from aspiration. Surgery was consulted and the hernia was reduced. Labs showed elevated trop .21, cr 3.6. Cards consulted and admission to medicine service requested. He is admitted for further eval and management.      Review of Systems The patient denies fever, weight loss, vision loss, decreased hearing,, syncope,peripheral edema, balance deficits, hemoptysis,  melena, hematochezia, hematuria, incontinence,  , suspicious skin lesions, transient blindness, difficulty walking, depression, unusual weight change, abnormal bleeding.   Past Medical History  Diagnosis Date  . Hyperlipidemia   . Hypertension   . Hepatitis   . Obesity, morbid   . Benign prostatic hypertrophy   . Cataracts, bilateral   .  Carpal tunnel syndrome, bilateral   . Diverticulosis of colon   . Nephrolithiasis   . Low back pain   . ALLERGIC RHINITIS   . Head mass   . Diabetes mellitus type II   . Coronary artery disease     Dr. Tresa Endo; 2D ECHO, 12/11/2011 - EF 50-55%, normal; NUCLEAR STRESS TEST, 10/16/2010 - no evidemce of inducible ischemia  . 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 Last nuc 2012 negative. 07/21/2013  . Inguinal hernia, Rt reduced, Lt present 07/21/2013   Past Surgical History  Procedure Laterality Date  . Appendectomy    . Coronary artery bypass graft    . Cardiac catheterization     Social History:  reports that he has quit smoking. He does not have any smokeless tobacco history on file. He reports that he does not drink alcohol or use illicit drugs.  No Known Allergies  Family History  Problem Relation Age of Onset  . Heart attack Mother   . Alzheimer's disease Father   . Heart attack Brother   . Heart attack Maternal Grandmother   . Hypertension Maternal Grandmother   . Cancer Brother     Pancreatic  . Kidney disease Brother   . Hypertension Son   . Kidney disease Son      Prior to Admission medications   Medication Sig Start Date End Date Taking? Authorizing Provider  acetaminophen (TYLENOL) 325 MG tablet Take 325 mg  by mouth every 6 (six) hours as needed for mild pain.   Yes Historical Provider, MD  Ascorbic Acid (VITAMIN C) 1000 MG tablet Take 1,000 mg by mouth daily.   Yes Historical Provider, MD  Aspirin (ADULT ASPIRIN LOW STRENGTH) 81 MG EC tablet Take 81 mg by mouth daily.     Yes Historical Provider, MD  atorvastatin (LIPITOR) 40 MG tablet Take 1 tablet (40 mg total) by mouth daily. 05/25/13  Yes Corwin Levins, MD  clopidogrel (PLAVIX) 75 MG tablet Take 1 tablet (75 mg total) by mouth daily. 05/25/13   Yes Corwin Levins, MD  diclofenac sodium (VOLTAREN) 1 % GEL Apply 2 g topically 4 (four) times daily as needed (for arthritis).   Yes Historical Provider, MD  folic acid (FOLVITE) 1 MG tablet Take 1 tablet (1 mg total) by mouth daily. 06/13/13  Yes Lennette Bihari, MD  losartan-hydrochlorothiazide (HYZAAR) 100-12.5 MG per tablet Take 1 tablet by mouth daily. 05/25/13 05/25/14 Yes Corwin Levins, MD  metoprolol (LOPRESSOR) 50 MG tablet Take 1 tablet (50 mg total) by mouth 2 (two) times daily. 05/25/13  Yes Corwin Levins, MD  Multiple Vitamin (MULTIVITAMIN) capsule Take 1 capsule by mouth daily.    Yes Historical Provider, MD  niacin (NIASPAN) 750 MG CR tablet Take 750 mg by mouth at bedtime.   Yes Historical Provider, MD  Omega-3 Fatty Acids (OMEGA 3 PO) Take 360 mg by mouth 2 (two) times daily.   Yes Historical Provider, MD  tamsulosin (FLOMAX) 0.4 MG CAPS capsule Take 1 capsule (0.4 mg total) by mouth daily. 07/18/13   Corwin Levins, MD   Physical Exam: Filed Vitals:   07/21/13 1840  BP: 99/68  Temp: 97.9 F (36.6 C)  Resp: 18    BP 99/68  Temp(Src) 97.9 F (36.6 C) (Oral)  Resp 18  SpO2 99% Constitutional: Vital signs reviewed.  Patient is a well-developed, obese ,in no acute distress and cooperative with exam. Alert and oriented x3.  Head: Normocephalic and atraumatic Ear: TM normal bilaterally Mouth: no erythema or exudates, dry MM Eyes: PERRL, EOMI, conjunctivae normal, No scleral icterus.  Neck: Supple, Trachea midline normal ROM, No JVD, mass, thyromegaly, or carotid bruit present.  Cardiovascular: RRR, S1 normal, S2 normal, no MRG, pulses symmetric and intact bilaterally Pulmonary/Chest: Rhonchi at bases, no wheezes, rales, or rhonchi Abdominal: Soft. obese, non-distended, decreased bowel sounds, no masses, organomegaly, or guarding present. RLQ/R. Groin tenderness present  Extremities: no cyanosis and no edema Neurological: A&O x3, Strength is normal and symmetric bilaterally,  cranial nerve II-XII are grossly intact, no focal motor deficit, sensory intact to light touch bilaterally.  Skin: Warm, dry and intact. No rash.  Psychiatric: Normal mood and affect. speech and behavior is normal.              Labs on Admission:  Basic Metabolic Panel:  Recent Labs Lab 07/21/13 1251 07/21/13 1318  NA 134* 133*  K 3.8 3.6  CL 86* 91*  CO2 25  --   GLUCOSE 231* 226*  BUN 66* 60*  CREATININE 3.59* 3.60*  CALCIUM 9.7  --    Liver Function Tests:  Recent Labs Lab 07/21/13 1251  AST 30  ALT 22  ALKPHOS 63  BILITOT 1.2  PROT 7.1  ALBUMIN 4.0   No results found for this basename: LIPASE, AMYLASE,  in the last 168 hours No results found for this basename: AMMONIA,  in the last 168 hours CBC:  Recent  Labs Lab 07/21/13 1251 07/21/13 1318  WBC 7.3  --   NEUTROABS 5.8  --   HGB 17.1* 17.3*  HCT 46.9 51.0  MCV 95.5  --   PLT 228  --    Cardiac Enzymes:  Recent Labs Lab 07/21/13 1251 07/21/13 1715  TROPONINI <0.30 0.33*    BNP (last 3 results) No results found for this basename: PROBNP,  in the last 8760 hours CBG:  Recent Labs Lab 07/21/13 1241  GLUCAP 185*    Radiological Exams on Admission: Ct Abdomen Pelvis Wo Contrast  07/21/2013   CLINICAL DATA:  Chest pain.  Shortness of breath.  EXAM: CT CHEST WITHOUT CONTRAST; CT ABDOMEN AND PELVIS WITHOUT CONTRAST  TECHNIQUE: Multidetector CT imaging of the chest was performed following the standard protocol without IV contrast.; Multidetector CT imaging of the abdomen and pelvis was performed following the standard protocol without intravenous contrast.  COMPARISON:  None.  FINDINGS: CT chest:  No evidence of mediastinal or hilar adenopathy. The thoracic esophagus is severely dilated. A hiatal hernia is present. The hiatal hernia is fluid filled. There is mild thickening of the gastroesophageal junction. Prior CABG. Heart size normal. Large airways are patent. Focal bilateral small ill-defined  pulmonary infiltrates are present throughout both lung fields. Although metastatic disease cannot be excluded, these are most likely infectious. Typical and atypical infection including granulomatous disease should be considered. Multifocal small areas of aspiration could present in this fashion .  CT abdomen pelvis:  Liver is normal. Spleen is normal. Pancreas is normal. Gallbladder is normal. No biliary distention.  Adrenals normal. Kidneys normal. No hydronephrosis. No evidence of obstructing ureteral stone. Bladder is nondistended. Mild prostate enlargement. Phleboliths in pelvis.  Right inguinal hernia is present with herniation of what appears to be a distal loop of small bowel. Proximal bowel severe distention is present. Distal bowel is collapsed. Associated gastric distension is present. As noted above esophagus is distended. These findings are consistent with severe distal small bowel obstruction secondary to herniation into the inguinal canal on the right. Bilateral hydroceles are noted. There is a left inguinal hernia left with herniation of fat only. Appendix not definitely identified. Sigmoid colonic diverticulosis. No evidence of diverticulitis. No pneumatosis or free air identified.  No adenopathy. Atherosclerotic vascular changes noted of the abdominal aorta.  No acute bony abnormality identified. Thoracolumbar degenerative changes i present.  IMPRESSION: 1. Right inguinal hernia with herniation of the distal small bowel loop with severe obstruction of the small bowel. This results in severe distention of the small bowel and severe distention of the stomach. The esophagus is also severely distended. There is also a left inguinal hernia with herniation of fat only. Bilateral hydroceles are also noted. 2. Patchy bilateral pulmonary infiltrates consistent with pneumonitis possibly from aspiration. 3. Prior CABG. These results were called by telephone at the time of interpretation on 07/21/2013 at 2:32  PM to Dr. Raeford Razor , who verbally acknowledged these results.   Electronically Signed   By: Maisie Fus  Register   On: 07/21/2013 14:35   Ct Chest Wo Contrast  07/21/2013   CLINICAL DATA:  Chest pain.  Shortness of breath.  EXAM: CT CHEST WITHOUT CONTRAST; CT ABDOMEN AND PELVIS WITHOUT CONTRAST  TECHNIQUE: Multidetector CT imaging of the chest was performed following the standard protocol without IV contrast.; Multidetector CT imaging of the abdomen and pelvis was performed following the standard protocol without intravenous contrast.  COMPARISON:  None.  FINDINGS: CT chest:  No evidence of mediastinal  or hilar adenopathy. The thoracic esophagus is severely dilated. A hiatal hernia is present. The hiatal hernia is fluid filled. There is mild thickening of the gastroesophageal junction. Prior CABG. Heart size normal. Large airways are patent. Focal bilateral small ill-defined pulmonary infiltrates are present throughout both lung fields. Although metastatic disease cannot be excluded, these are most likely infectious. Typical and atypical infection including granulomatous disease should be considered. Multifocal small areas of aspiration could present in this fashion .  CT abdomen pelvis:  Liver is normal. Spleen is normal. Pancreas is normal. Gallbladder is normal. No biliary distention.  Adrenals normal. Kidneys normal. No hydronephrosis. No evidence of obstructing ureteral stone. Bladder is nondistended. Mild prostate enlargement. Phleboliths in pelvis.  Right inguinal hernia is present with herniation of what appears to be a distal loop of small bowel. Proximal bowel severe distention is present. Distal bowel is collapsed. Associated gastric distension is present. As noted above esophagus is distended. These findings are consistent with severe distal small bowel obstruction secondary to herniation into the inguinal canal on the right. Bilateral hydroceles are noted. There is a left inguinal hernia left with  herniation of fat only. Appendix not definitely identified. Sigmoid colonic diverticulosis. No evidence of diverticulitis. No pneumatosis or free air identified.  No adenopathy. Atherosclerotic vascular changes noted of the abdominal aorta.  No acute bony abnormality identified. Thoracolumbar degenerative changes i present.  IMPRESSION: 1. Right inguinal hernia with herniation of the distal small bowel loop with severe obstruction of the small bowel. This results in severe distention of the small bowel and severe distention of the stomach. The esophagus is also severely distended. There is also a left inguinal hernia with herniation of fat only. Bilateral hydroceles are also noted. 2. Patchy bilateral pulmonary infiltrates consistent with pneumonitis possibly from aspiration. 3. Prior CABG. These results were called by telephone at the time of interpretation on 07/21/2013 at 2:32 PM to Dr. Raeford Razor , who verbally acknowledged these results.   Electronically Signed   By: Maisie Fus  Register   On: 07/21/2013 14:35   Dg Chest Portable 1 View  07/21/2013   CLINICAL DATA:  Short of breath.  Chest pain.  EXAM: PORTABLE CHEST - 1 VIEW  COMPARISON:  None.  FINDINGS: Low volume chest. CABG. Cardiopericardial silhouette appears within normal limits. Artifact projects over the upper chest. Monitoring leads project over the chest. No airspace disease. No effusion. Subsegmental scarring in the lingula.  Retrocardiac shadow is present, likely representing fluid distended esophagus performed on contemporaneously CT.  IMPRESSION: Low volume chest. No active cardiopulmonary disease. CABG. Retrocardiac shadow likely represents fluid-filled esophagus demonstrated on CT.   Electronically Signed   By: Andreas Newport M.D.   On: 07/21/2013 14:04    EKG: Independently reviewed. NSR ,no acute ischemic changes, +LVH.  Assessment/Plan Principal Problem:   SBO (small bowel obstruction), secondary to incarcerated R. inguinal  Hernia -hernia reduced per surgery in ED and pt now started on clears, and he denies pain at this time -plan per surgery is to place NG if N/V recurs , but o/w surgery later -follow and hold plavix when ok with cards>> for now Dr Rennis Golden recommends to continue plavix if pt tolerates po Active Problems:   Hypotension, unspecified  -hypovolemia vs cardiogenic vs infection -improved with IVF, continue hydration -cycle CEs, follow>> dicussed with cards and Dr Rennis Golden recommends to start heparinonly if significant bump in subsequent troponins: follow up on echo -empiric abx for aspiration PNA, check UA follow  Elevated troponin I level -In pt with CAD, though noted to have elevated Cr and EKG with no acute ischemic changes -further cycle trops, echo -continue ASA, plavix for now as dicussed pt cards and BB as BP tolerates -cards consulted as above, appreciate assistance Aspiration PNA -emipiric abx with zosyn and follow   Inguinal hernia, Rt reduced, Lt present -per surgery, as discussed above   Acute renal failure -likely d/t volume depletion secondary to #1, and with pt on ACE/HCTZ and hypotension -check UA, renal US -hold ACE/HCTZ hydrate, follow and recheck   CAD (coronary artery disease), hx of CABG 1997 X 6. Last nuc 2012 negative -continue out pt meds as BP tolerates.  Morbid obesity DM -cover with SSI, check AIC   Code Status: full Family Communication: none at bedside Disposition Plan: admit to step down  Time spent: >55mins  Kela Millin [facility] Pager [phone]

## 2013-07-21 NOTE — Consult Note (Signed)
Reason for Consult:   RIH with obstruction Referring Physician: Dr. Raeford Razor (ED) PCP:  Oliver Barre, MD Cardiology:  Dr. Nicki Guadalajara  Andres [name] is an 72 y.o. male.  CC: I have a hernia HPI: 72 y/o who presented to his PCP today with complaints of intermittent right groin pain, and hoarseness.  In Dr. Raphael Gibney office he was hypotensive, clammy, wheezing,diaphoretic and weak.  Right groin was tender, but no swelling noted.  BP 86/60with a HR of 97.  He was sent to the ER from Dr. Raphael Gibney office.  Here in the ER it is difficult to get a history, or even chief complaint.  He isn't hurting now and knows Dr. Jonny Ruiz sent him.  He did not complain of pain before admission here in the ER with me.  He says he had felt bad on and off since at least Sunday 12/14, with nausea and vomiting on 12/14 and again 12/16.  He has not had a BM since 12/14 as best he can remember. Work up by Dr. Juleen China in the ER included CT of the chest, abdomen and pelvis without IV contrast. This shows :1. Right inguinal hernia with herniation of the distal small bowel  loop with severe obstruction of the small bowel. This results in severe distention of the small bowel and severe distention of the stomach. The esophagus is also severely distended. There is also a left inguinal hernia with herniation of fat only. Bilateral hydroceles are also noted. 2. Patchy bilateral pulmonary infiltrates consistent with pneumonitis possibly from aspiration. 3. Prior CABG. Labs show acute renal failure with a creatinine of 3.6, (creatinine was normal 1.1 04/18/13)  tropin slightly elevated at 0.21. H/H 17.3/51. WBC 7.3. He is still hypotensive and with BP in the 90's, and receiving a fluid bolus.  Currently he has no complaints, and it's hard to get a reason for his being in the hospital  On exam the hernia we saw on CT has reduced on it's own.  Medicine will admit and once he is medically stable we will discuss surgery to repair RIH.  He also has one  on the left, not quite this large, no SB found on exam.  We were ask to see by Dr. Juleen China   Past Medical History  Diagnosis Date  . Coronary artery disease     Dr. Tresa Endo; 2D ECHO, 12/11/2011 - EF 50-55%, normal; NUCLEAR STRESS TEST, 10/16/2010 - no evidemce of inducible ischemia   S/p CABG x 6 1997 Dr. Tyrone Sage  . Hypertension     . Diabetes mellitus type II     Occlusion and stenosis of carotid artery without mention of cerebral infarction  On plavix  CAROTID DOPPLER, 03/18/2012 - srable, mild, hard, plaque noted, bilaterally    Obesity, morbid/BMI 37   Tobacco use  38 years, quit 1995  ETOH use Quit 1995  Hyperlipidemia     Hepatitis   Benign prostatic hypertrophy   Cataracts, bilateral   Carpal tunnel syndrome, bilateral   Diverticulosis of colon   Nephrolithiasis   Low back pain   ALLERGIC RHINITIS   Head mass        Past Surgical History  Procedure Laterality Date  . Appendectomy    . Coronary artery bypass graft  1997    Family History  Problem Relation Age of Onset  . Heart attack Mother   . Alzheimer's disease Father   . Heart attack Brother   . Heart attack Maternal Grandmother   .  Hypertension Maternal Grandmother   . Cancer Brother     Pancreatic  . Kidney disease Brother   . Hypertension Son   . Kidney disease Son     Social History:  reports that he has quit smoking. He does not have any smokeless tobacco history on file. He reports that he does not drink alcohol or use illicit drugs.  Allergies: No Known Allergies  Medications:  Prior to Admission:  Prior to Admission medications   Medication Sig Start Date End Date Taking? Authorizing Provider  acetaminophen (TYLENOL) 325 MG tablet Take 325 mg by mouth every 6 (six) hours as needed for mild pain.   Yes Historical Provider, MD  Ascorbic Acid (VITAMIN C) 1000 MG tablet Take 1,000 mg by mouth daily.   Yes Historical Provider, MD  Aspirin (ADULT ASPIRIN LOW STRENGTH) 81 MG EC tablet Take 81 mg by  mouth daily.     Yes Historical Provider, MD  atorvastatin (LIPITOR) 40 MG tablet Take 1 tablet (40 mg total) by mouth daily. 05/25/13  Yes Corwin Levins, MD  clopidogrel (PLAVIX) 75 MG tablet Take 1 tablet (75 mg total) by mouth daily. 05/25/13  Yes Corwin Levins, MD  diclofenac sodium (VOLTAREN) 1 % GEL Apply 2 g topically 4 (four) times daily as needed (for arthritis).   Yes Historical Provider, MD  folic acid (FOLVITE) 1 MG tablet Take 1 tablet (1 mg total) by mouth daily. 06/13/13  Yes Lennette Bihari, MD  losartan-hydrochlorothiazide (HYZAAR) 100-12.5 MG per tablet Take 1 tablet by mouth daily. 05/25/13 05/25/14 Yes Corwin Levins, MD  metoprolol (LOPRESSOR) 50 MG tablet Take 1 tablet (50 mg total) by mouth 2 (two) times daily. 05/25/13  Yes Corwin Levins, MD  Multiple Vitamin (MULTIVITAMIN) capsule Take 1 capsule by mouth daily.    Yes Historical Provider, MD  niacin (NIASPAN) 750 MG CR tablet Take 750 mg by mouth at bedtime.   Yes Historical Provider, MD  Omega-3 Fatty Acids (OMEGA 3 PO) Take 360 mg by mouth 2 (two) times daily.   Yes Historical Provider, MD  tamsulosin (FLOMAX) 0.4 MG CAPS capsule Take 1 capsule (0.4 mg total) by mouth daily. 07/18/13   Corwin Levins, MD    Scheduled: . lactose free nutrition  237 mL Oral TID WC   Continuous:  [id number] injection, ondansetron (ZOFRAN) IV, promethazine Anti-infectives   Start     Dose/Rate Route Frequency Ordered Stop   07/21/13 1445  piperacillin-tazobactam (ZOSYN) IVPB 3.375 g     3.375 g 100 mL/hr over 30 Minutes Intravenous  Once 07/21/13 1435 07/21/13 1520      Results for orders placed during the hospital encounter of 07/21/13 (from the past 48 hour(s))  GLUCOSE, CAPILLARY     Status: Abnormal   Collection Time    07/21/13 12:41 PM      Result Value Range   Glucose-Capillary 185 (*) 70 - 99 mg/dL  COMPREHENSIVE METABOLIC PANEL     Status: Abnormal   Collection Time    07/21/13 12:51 PM      Result Value Range   Sodium  134 (*) 135 - 145 mEq/L   Potassium 3.8  3.5 - 5.1 mEq/L   Chloride 86 (*) 96 - 112 mEq/L   CO2 25  19 - 32 mEq/L   Glucose, Bld 231 (*) 70 - 99 mg/dL   BUN 66 (*) 6 - 23 mg/dL   Creatinine, Ser 9.81 (*) 0.50 - 1.35 mg/dL   Calcium  9.7  8.4 - 10.5 mg/dL   Total Protein 7.1  6.0 - 8.3 g/dL   Albumin 4.0  3.5 - 5.2 g/dL   AST 30  0 - 37 U/L   ALT 22  0 - 53 U/L   Alkaline Phosphatase 63  39 - 117 U/L   Total Bilirubin 1.2  0.3 - 1.2 mg/dL   GFR calc non Af Amer 15 (*) >90 mL/min   GFR calc Af Amer 18 (*) >90 mL/min   Comment: (NOTE)     The eGFR has been calculated using the CKD EPI equation.     This calculation has not been validated in all clinical situations.     eGFR's persistently <90 mL/min signify possible Chronic Kidney     Disease.  CBC WITH DIFFERENTIAL     Status: Abnormal   Collection Time    07/21/13 12:51 PM      Result Value Range   WBC 7.3  4.0 - 10.5 K/uL   RBC 4.91  4.22 - 5.81 MIL/uL   Hemoglobin 17.1 (*) 13.0 - 17.0 g/dL   HCT 45.4  09.8 - 11.9 %   MCV 95.5  78.0 - 100.0 fL   MCH 34.8 (*) 26.0 - 34.0 pg   MCHC 36.5 (*) 30.0 - 36.0 g/dL   RDW 14.7  82.9 - 56.2 %   Platelets 228  150 - 400 K/uL   Neutrophils Relative % 79 (*) 43 - 77 %   Neutro Abs 5.8  1.7 - 7.7 K/uL   Lymphocytes Relative 11 (*) 12 - 46 %   Lymphs Abs 0.8  0.7 - 4.0 K/uL   Monocytes Relative 10  3 - 12 %   Monocytes Absolute 0.7  0.1 - 1.0 K/uL   Eosinophils Relative 0  0 - 5 %   Eosinophils Absolute 0.0  0.0 - 0.7 K/uL   Basophils Relative 0  0 - 1 %   Basophils Absolute 0.0  0.0 - 0.1 K/uL  PROTIME-INR     Status: None   Collection Time    07/21/13 12:51 PM      Result Value Range   Prothrombin Time 13.8  11.6 - 15.2 seconds   INR 1.08  0.00 - 1.49  TROPONIN I     Status: None   Collection Time    07/21/13 12:51 PM      Result Value Range   Troponin I <0.30  <0.30 ng/mL   Comment:            Due to the release kinetics of cTnI,     a negative result within the first hours      of the onset of symptoms does not rule out     myocardial infarction with certainty.     If myocardial infarction is still suspected,     repeat the test at appropriate intervals.  TYPE AND SCREEN     Status: None   Collection Time    07/21/13 12:55 PM      Result Value Range   ABO/RH(D) A POS     Antibody Screen NEG     Sample Expiration 07/24/2013    ABO/RH     Status: None   Collection Time    07/21/13 12:55 PM      Result Value Range   ABO/RH(D) A POS    POCT I-STAT TROPONIN I     Status: Abnormal   Collection Time    07/21/13  1:16 PM  Result Value Range   Troponin i, poc 0.21 (*) 0.00 - 0.08 ng/mL   Comment NOTIFIED PHYSICIAN     Comment 3            Comment: Due to the release kinetics of cTnI,     a negative result within the first hours     of the onset of symptoms does not rule out     myocardial infarction with certainty.     If myocardial infarction is still suspected,     repeat the test at appropriate intervals.  POCT I-STAT, CHEM 8     Status: Abnormal   Collection Time    07/21/13  1:18 PM      Result Value Range   Sodium 133 (*) 135 - 145 mEq/L   Potassium 3.6  3.5 - 5.1 mEq/L   Chloride 91 (*) 96 - 112 mEq/L   BUN 60 (*) 6 - 23 mg/dL   Creatinine, Ser 0.98 (*) 0.50 - 1.35 mg/dL   Glucose, Bld 119 (*) 70 - 99 mg/dL   Calcium, Ion 1.47 (*) 1.13 - 1.30 mmol/L   TCO2 27  0 - 100 mmol/L   Hemoglobin 17.3 (*) 13.0 - 17.0 g/dL   HCT 82.9  56.2 - 13.0 %    Ct Abdomen Pelvis Wo Contrast  07/21/2013   CLINICAL DATA:  Chest pain.  Shortness of breath.  EXAM: CT CHEST WITHOUT CONTRAST; CT ABDOMEN AND PELVIS WITHOUT CONTRAST  TECHNIQUE: Multidetector CT imaging of the chest was performed following the standard protocol without IV contrast.; Multidetector CT imaging of the abdomen and pelvis was performed following the standard protocol without intravenous contrast.  COMPARISON:  None.  FINDINGS: CT chest:  No evidence of mediastinal or hilar adenopathy.  The thoracic esophagus is severely dilated. A hiatal hernia is present. The hiatal hernia is fluid filled. There is mild thickening of the gastroesophageal junction. Prior CABG. Heart size normal. Large airways are patent. Focal bilateral small ill-defined pulmonary infiltrates are present throughout both lung fields. Although metastatic disease cannot be excluded, these are most likely infectious. Typical and atypical infection including granulomatous disease should be considered. Multifocal small areas of aspiration could present in this fashion .  CT abdomen pelvis:  Liver is normal. Spleen is normal. Pancreas is normal. Gallbladder is normal. No biliary distention.  Adrenals normal. Kidneys normal. No hydronephrosis. No evidence of obstructing ureteral stone. Bladder is nondistended. Mild prostate enlargement. Phleboliths in pelvis.  Right inguinal hernia is present with herniation of what appears to be a distal loop of small bowel. Proximal bowel severe distention is present. Distal bowel is collapsed. Associated gastric distension is present. As noted above esophagus is distended. These findings are consistent with severe distal small bowel obstruction secondary to herniation into the inguinal canal on the right. Bilateral hydroceles are noted. There is a left inguinal hernia left with herniation of fat only. Appendix not definitely identified. Sigmoid colonic diverticulosis. No evidence of diverticulitis. No pneumatosis or free air identified.  No adenopathy. Atherosclerotic vascular changes noted of the abdominal aorta.  No acute bony abnormality identified. Thoracolumbar degenerative changes i present.  IMPRESSION: 1. Right inguinal hernia with herniation of the distal small bowel loop with severe obstruction of the small bowel. This results in severe distention of the small bowel and severe distention of the stomach. The esophagus is also severely distended. There is also a left inguinal hernia with  herniation of fat only. Bilateral hydroceles are also noted. 2. Patchy  bilateral pulmonary infiltrates consistent with pneumonitis possibly from aspiration. 3. Prior CABG. These results were called by telephone at the time of interpretation on 07/21/2013 at 2:32 PM to Dr. Raeford Razor , who verbally acknowledged these results.   Electronically Signed   By: Maisie Fus  Register   On: 07/21/2013 14:35   Ct Chest Wo Contrast  07/21/2013   CLINICAL DATA:  Chest pain.  Shortness of breath.  EXAM: CT CHEST WITHOUT CONTRAST; CT ABDOMEN AND PELVIS WITHOUT CONTRAST  TECHNIQUE: Multidetector CT imaging of the chest was performed following the standard protocol without IV contrast.; Multidetector CT imaging of the abdomen and pelvis was performed following the standard protocol without intravenous contrast.  COMPARISON:  None.  FINDINGS: CT chest:  No evidence of mediastinal or hilar adenopathy. The thoracic esophagus is severely dilated. A hiatal hernia is present. The hiatal hernia is fluid filled. There is mild thickening of the gastroesophageal junction. Prior CABG. Heart size normal. Large airways are patent. Focal bilateral small ill-defined pulmonary infiltrates are present throughout both lung fields. Although metastatic disease cannot be excluded, these are most likely infectious. Typical and atypical infection including granulomatous disease should be considered. Multifocal small areas of aspiration could present in this fashion .  CT abdomen pelvis:  Liver is normal. Spleen is normal. Pancreas is normal. Gallbladder is normal. No biliary distention.  Adrenals normal. Kidneys normal. No hydronephrosis. No evidence of obstructing ureteral stone. Bladder is nondistended. Mild prostate enlargement. Phleboliths in pelvis.  Right inguinal hernia is present with herniation of what appears to be a distal loop of small bowel. Proximal bowel severe distention is present. Distal bowel is collapsed. Associated gastric  distension is present. As noted above esophagus is distended. These findings are consistent with severe distal small bowel obstruction secondary to herniation into the inguinal canal on the right. Bilateral hydroceles are noted. There is a left inguinal hernia left with herniation of fat only. Appendix not definitely identified. Sigmoid colonic diverticulosis. No evidence of diverticulitis. No pneumatosis or free air identified.  No adenopathy. Atherosclerotic vascular changes noted of the abdominal aorta.  No acute bony abnormality identified. Thoracolumbar degenerative changes i present.  IMPRESSION: 1. Right inguinal hernia with herniation of the distal small bowel loop with severe obstruction of the small bowel. This results in severe distention of the small bowel and severe distention of the stomach. The esophagus is also severely distended. There is also a left inguinal hernia with herniation of fat only. Bilateral hydroceles are also noted. 2. Patchy bilateral pulmonary infiltrates consistent with pneumonitis possibly from aspiration. 3. Prior CABG. These results were called by telephone at the time of interpretation on 07/21/2013 at 2:32 PM to Dr. Raeford Razor , who verbally acknowledged these results.   Electronically Signed   By: Maisie Fus  Register   On: 07/21/2013 14:35   Dg Chest Portable 1 View  07/21/2013   CLINICAL DATA:  Short of breath.  Chest pain.  EXAM: PORTABLE CHEST - 1 VIEW  COMPARISON:  None.  FINDINGS: Low volume chest. CABG. Cardiopericardial silhouette appears within normal limits. Artifact projects over the upper chest. Monitoring leads project over the chest. No airspace disease. No effusion. Subsegmental scarring in the lingula.  Retrocardiac shadow is present, likely representing fluid distended esophagus performed on contemporaneously CT.  IMPRESSION: Low volume chest. No active cardiopulmonary disease. CABG. Retrocardiac shadow likely represents fluid-filled esophagus demonstrated  on CT.   Electronically Signed   By: Andreas Newport M.D.   On: 07/21/2013 14:04  Review of Systems  Constitutional: Negative for fever, chills, weight loss, malaise/fatigue and diaphoresis.  HENT: Negative.        Wears glasses  Eyes: Negative.   Respiratory: Positive for cough, sputum production (clear) and shortness of breath (some). Negative for wheezing.   Cardiovascular: Negative.   Gastrointestinal: Positive for heartburn, vomiting and constipation (no BM since 12/15). Negative for abdominal pain and diarrhea. Nausea: Sunday 12/14 and Tuesday 12/16.  Genitourinary:       Voiding less Slow stream   Musculoskeletal: Negative.   Skin: Negative.   Neurological: Negative.  Negative for weakness.  Endo/Heme/Allergies: Bruises/bleeds easily (on plavix).  Psychiatric/Behavioral: Negative.        He is very hard to get a history from.  As far as I can tell he has no psychiatric changes.  He worked weekend for [employer].   Blood pressure 108/67, temperature 97.1 F (36.2 C), temperature source Rectal, resp. rate 19, SpO2 99.00%. Physical Exam  Constitutional: He is oriented to person, place, and time. He appears well-developed.  Obese male, BP down getting fluid bolus.  No complaints of pain or discomfort.  HENT:  Head: Normocephalic and atraumatic.  Nose: Nose normal.  Eyes: Conjunctivae and EOM are normal. Pupils are equal, round, and reactive to light. Right eye exhibits no discharge. Left eye exhibits no discharge. No scleral icterus.  Neck: Normal range of motion. Neck supple. No JVD present. No tracheal deviation present. No thyromegaly present.  Cardiovascular: Normal rate, regular rhythm, normal heart sounds and intact distal pulses.  Exam reveals no gallop.   No murmur heard. Respiratory: Effort normal and breath sounds normal. No respiratory distress. He has no wheezes. He has no rales. He exhibits no tenderness.  GI: He exhibits distension. He exhibits  no mass. There is no tenderness. There is no rebound and no guarding.  Few high pitched BS  Genitourinary:  He has bilateral inguinal hernias, the right is larger than the left.  Currently it is reduced on exam.  Musculoskeletal: He exhibits no edema and no tenderness.  Lymphadenopathy:    He has no cervical adenopathy.  Neurological: He is alert and oriented to person, place, and time. No cranial nerve deficit.  Skin: Skin is warm and dry. No rash noted. No erythema. No pallor.  Psychiatric: He has a normal mood and affect. His behavior is normal. Judgment and thought content normal.    Assessment/Plan: 1.  SBO with RIH, now reduced. He also has a LIH. 2.  Hypotension  3.  Acute renal failure 4.  Possible aspiration pneumonia 5.  CAD, s/p CABG with positive troponin (.21) on Plavis 6.  Hx of hypertension 7.  AODM 8.  Hyperlipidemia 9.  PVD 10. Obesity BMI 37 11.  Hx of hepatitis 12.  BPH  Plan:  He needs to be admitted by Medicine.  His hernia is currently resolved.  If he has recurrent nausea or vomiting he needs an NG placed.  He needs to be off Plavix for 5 days if possible. He needs his renal failure and cardiac issues resolved, and when stable we will discuss repair of his RIH.  We will follow with you.  Emalyn Schou 07/21/2013, 3:57 PM

## 2013-07-21 NOTE — Consult Note (Signed)
Pt. Seen and examined. Agree with the NP/PA-C note as written. 72 yo patient of Dr. Tresa Endo with a history of CABG x 6 vessels in 1997.  He has a known inferior scar by NST in 2012. EF was 50-55% by echo in 12/2011.  He now presents with abdominal pain, nausea and vomiting x 2, along with weakness, diaphoresis and hypotension. He was evaluated by surgery for abdominal pain due to signs of obstruction on an abdominal CT - he has an easily reducible hernia.  However, it was noted he has acute renal failure, signs of dehydration and a mildly elevated POC troponin (but negative Troponin-I ).  He has denied any chest pain.  EKG shows J-point elevation in V2-V3, unchanged from prior EKG.  Impression: 1.  Elevated troponin - may be due to renal failure, ACS or Type II MI 2.  Acute renal failure - probably due to dehydration in the setting of bowel obstruction, vomiting  Recommend: 1.  Hold on heparinization unless he has chest pain 2.  Re-check 2D echo for EF and/or new WMA's 3.  May ultimately need a functional study (NST vs. Cath, dependent on troponin +/- renal recovery) for risk stratification  Will follow along with you.  Thanks for consulting [place].  Chrystie Nose, MD, [facility] Attending Cardiologist [facility]

## 2013-07-21 NOTE — Progress Notes (Signed)
Pre-visit discussion using our clinic review tool. No additional management support is needed unless otherwise documented below in the visit note.  

## 2013-07-21 NOTE — Assessment & Plan Note (Addendum)
?   Etiology, possibly volume related but denies orthostasis, drove himself here; has hx of CAD, TIA,  - has No CP or fever, is on 2 anti-HTN meds but given the dyspnea, diaphoresis, clammy extremities he warrants transport to ER for further evaluation;  ECG done - sinus with PVC, nonspecific changes only  Note:  Total time for pt hx, exam, review of record with pt in the room, determination of diagnoses and plan for further eval and tx is > 40 min, with over 50% spent in coordination and counseling of patient

## 2013-07-21 NOTE — Progress Notes (Signed)
Subjective:    Patient ID: Andres [name], male    DOB: 1940/10/17, 72 y.o.   MRN: [id number]  HPI Pt here for f/u and wants to be seen for c/o 1 wk intermittent right groin pain (has known small RIH in past), as well as 2-3 days onset hoarseness, but noted on initial exam per nurse to be hypotensive, and on my history taking he is dyspneic with some difficulty speaking complete sentences and quite diaphoretic, clammy extremities, and appears weak.  Pt denies chest pain,, wheezing, orthopnea, PND, increased LE swelling, palpitations, dizziness or syncope. Is on 2 anti-HTN meds and may have had some decreased po intake recently with ? Mild URI symptoms.  Currently right groin is not tender but does have the swelling.  No recent fever, chills.   Past Medical History  Diagnosis Date  . Hyperlipidemia   . Hypertension   . Hepatitis   . Obesity, morbid   . Benign prostatic hypertrophy   . Cataracts, bilateral   . Carpal tunnel syndrome, bilateral   . Diverticulosis of colon   . Nephrolithiasis   . Low back pain   . ALLERGIC RHINITIS   . Head mass   . Diabetes mellitus type II   . Coronary artery disease     Dr. Tresa Endo; 2D ECHO, 12/11/2011 - EF 50-55%, normal; NUCLEAR STRESS TEST, 10/16/2010 - no evidemce of inducible ischemia  . 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 Appendectomy    . Coronary artery bypass graft      reports that he has quit smoking. He does not have any smokeless tobacco history on file. He reports that he does not drink alcohol or use illicit drugs. family history includes Alzheimer's disease in his father; Cancer in his brother; Heart attack in his brother, maternal grandmother, and mother; Hypertension in his maternal grandmother and son; Kidney disease in his brother and son. No Known  Allergies Current Outpatient Prescriptions on File Prior to Visit  Medication Sig Dispense Refill  . Ascorbic Acid (VITAMIN C) 500 MG tablet Take 500 mg by mouth daily.        . Aspirin (ADULT ASPIRIN LOW STRENGTH) 81 MG EC tablet Take 81 mg by mouth daily.        Marland Kitchen atorvastatin (LIPITOR) 40 MG tablet Take 1 tablet (40 mg total) by mouth daily.  90 tablet  3  . clopidogrel (PLAVIX) 75 MG tablet Take 1 tablet (75 mg total) by mouth daily.  90 tablet  3  . diclofenac sodium (VOLTAREN) 1 % GEL Apply 2 g topically 4 (four) times daily as needed.  100 g  5  . folic acid (FOLVITE) 1 MG tablet Take 1 tablet (1 mg total) by mouth daily.  90 tablet  2  . LEVITRA 20 MG tablet TAKE ONE TABLET BY MOUTH EVERY OTHER DAY AS NEEDED  5 each  11  . losartan-hydrochlorothiazide (HYZAAR) 100-12.5 MG per tablet Take 1 tablet by mouth daily.  90 tablet  3  . metoprolol (LOPRESSOR) 50 MG tablet Take 1 tablet (50 mg total) by mouth 2 (two) times daily.  180 tablet  3  . Multiple Vitamin (MULTIVITAMIN) capsule Take 1 capsule by mouth daily.        . niacin (NIASPAN) 750 MG CR tablet Take 750 mg by mouth at  bedtime.      . tamsulosin (FLOMAX) 0.4 MG CAPS capsule Take 1 capsule (0.4 mg total) by mouth daily.  90 capsule  3   No current facility-administered medications on file prior to visit.   Review of Systems Not done in detail    Objective:   Physical Exam BP 86/60  Pulse 97  Temp(Src) 96.6 F (35.9 C) (Oral)  Ht 5\' 6"  (1.676 m)  Wt 229 lb (103.874 kg)  BMI 36.98 kg/m2  SpO2 92% VS noted, some fatigued, marked diaphoretic Constitutional: Pt appears well-developed and well-nourished.  HENT: Head: NCAT.  Right Ear: External ear normal.  Left Ear: External ear normal.  Eyes: Conjunctivae and EOM are normal. Pupils are equal, round, and reactive to light.  Neck: Normal range of motion. Neck supple.  Cardiovascular: Normal rate and regular rhythm.   Pulmonary/Chest: Effort normal and breath sounds normal.   Abd:  Soft, NT, non-distended, + BS RIH - small , NT , somewhat difficult to reduce Neurological: Pt is alert. Not confused , motor 5/5 Skin: Skin is warm. No erythema. No LE edema Psychiatric: Pt behavior is normal. Thought content normal.     Assessment & Plan:

## 2013-07-21 NOTE — Patient Instructions (Signed)
You will need to be transported to ER at [facility] for futher evaluation given the low BP, sweating, weakness.

## 2013-07-22 ENCOUNTER — Inpatient Hospital Stay (HOSPITAL_COMMUNITY): Payer: Medicare Other

## 2013-07-22 DIAGNOSIS — E860 Dehydration: Secondary | ICD-10-CM

## 2013-07-22 DIAGNOSIS — I059 Rheumatic mitral valve disease, unspecified: Secondary | ICD-10-CM

## 2013-07-22 LAB — BASIC METABOLIC PANEL
BUN: 83 mg/dL — ABNORMAL HIGH (ref 6–23)
CO2: 27 mEq/L (ref 19–32)
Calcium: 8.6 mg/dL (ref 8.4–10.5)
Chloride: 93 mEq/L — ABNORMAL LOW (ref 96–112)
GFR calc non Af Amer: 11 mL/min — ABNORMAL LOW (ref >90)
Glucose, Bld: 152 mg/dL — ABNORMAL HIGH (ref 70–99)
Potassium: 4.5 mEq/L (ref 3.5–5.1)
Sodium: 134 mEq/L — ABNORMAL LOW (ref 135–145)

## 2013-07-22 LAB — URINE MICROSCOPIC-ADD ON

## 2013-07-22 LAB — CBC
HCT: 42.6 % (ref 39.0–52.0)
Hemoglobin: 15 g/dL (ref 13.0–17.0)
MCH: 33.6 pg (ref 26.0–34.0)
MCHC: 35.2 g/dL (ref 30.0–36.0)
MCV: 95.3 fL (ref 78.0–100.0)
RBC: 4.47 MIL/uL (ref 4.22–5.81)
WBC: 6.9 10*3/uL (ref 4.0–10.5)

## 2013-07-22 LAB — URINALYSIS, ROUTINE W REFLEX MICROSCOPIC
Glucose, UA: NEGATIVE mg/dL
Hgb urine dipstick: NEGATIVE
Ketones, ur: 15 mg/dL — AB
Leukocytes, UA: NEGATIVE
Protein, ur: 30 mg/dL — AB
Specific Gravity, Urine: 1.023 (ref 1.005–1.030)
pH: 5 (ref 5.0–8.0)

## 2013-07-22 LAB — HEMOGLOBIN A1C
Hgb A1c MFr Bld: 6.4 % — ABNORMAL HIGH (ref <5.7)
Mean Plasma Glucose: 137 mg/dL — ABNORMAL HIGH (ref <117)

## 2013-07-22 LAB — GLUCOSE, CAPILLARY
Glucose-Capillary: 101 mg/dL — ABNORMAL HIGH (ref 70–99)
Glucose-Capillary: 105 mg/dL — ABNORMAL HIGH (ref 70–99)
Glucose-Capillary: 109 mg/dL — ABNORMAL HIGH (ref 70–99)
Glucose-Capillary: 139 mg/dL — ABNORMAL HIGH (ref 70–99)
Glucose-Capillary: 143 mg/dL — ABNORMAL HIGH (ref 70–99)
Glucose-Capillary: 150 mg/dL — ABNORMAL HIGH (ref 70–99)

## 2013-07-22 MED ORDER — NIACIN ER 500 MG PO CPCR
750.0000 mg | ORAL_CAPSULE | Freq: Every day | ORAL | Status: DC
  Filled 2013-07-22: qty 1

## 2013-07-22 MED ORDER — SODIUM CHLORIDE 0.9 % IV BOLUS (SEPSIS): 250.0000 mL | Freq: Once | INTRAVENOUS | Status: DC

## 2013-07-22 MED ORDER — SODIUM CHLORIDE 0.9 % IV BOLUS (SEPSIS)
500.0000 mL | Freq: Once | INTRAVENOUS | Status: AC
  Administered 2013-07-22: 500 mL via INTRAVENOUS

## 2013-07-22 MED ORDER — SODIUM CHLORIDE 0.9 % IV BOLUS (SEPSIS)
250.0000 mL | Freq: Once | INTRAVENOUS | Status: AC
  Administered 2013-07-22: 250 mL via INTRAVENOUS

## 2013-07-22 NOTE — Progress Notes (Signed)
Subjective: Denies CP and SOB.   Objective: Vital signs in last 24 hours: Temp:  [96.6 F (35.9 C)-98.2 F (36.8 C)] 97.7 F (36.5 C) (12/19 0806) Pulse Rate:  [87-99] 99 (12/19 0400) Resp:  [18-31] 25 (12/19 0400) BP: (86-115)/(49-77) 115/53 mmHg (12/19 0400) SpO2:  [88 %-100 %] 93 % (12/19 0400) Weight:  [227 lb 8.2 oz (103.2 kg)-229 lb (103.874 kg)] 227 lb 8.2 oz (103.2 kg) (12/18 2100) Last BM Date: 07/19/13  Intake/Output from previous day: 12/18 0701 - 12/19 0700 In: 1079 [I.V.:979; IV Piggyback:100] Out: 2350 [Emesis/NG output:2350] Intake/Output this shift:    Medications Current Facility-Administered Medications  Medication Dose Route Frequency Provider Last Rate Last Dose  . 0.9 %  sodium chloride infusion   Intravenous Continuous Kela Millin, MD 110 mL/hr at 07/21/13 2306    . acetaminophen (TYLENOL) tablet 650 mg  650 mg Oral Q6H PRN Kela Millin, MD       Or  . acetaminophen (TYLENOL) suppository 650 mg  650 mg Rectal Q6H PRN Adeline C Viyuoh, MD      . aspirin chewable tablet 81 mg  81 mg Oral Daily Adeline C Viyuoh, MD      . atorvastatin (LIPITOR) tablet 40 mg  40 mg Oral Daily Adeline C Viyuoh, MD      . clopidogrel (PLAVIX) tablet 75 mg  75 mg Oral Daily Adeline C Viyuoh, MD      . diclofenac sodium (VOLTAREN) 1 % transdermal gel 2 g  2 g Topical QID PRN Adeline C Viyuoh, MD      . enoxaparin (LOVENOX) injection 30 mg  30 mg Subcutaneous Q24H Adeline C Viyuoh, MD   30 mg at 07/21/13 2306  . folic acid (FOLVITE) tablet 1 mg  1 mg Oral Daily Adeline C Viyuoh, MD      . insulin aspart (novoLOG) injection 0-5 Units  0-5 Units Subcutaneous QHS Adeline C Viyuoh, MD      . insulin aspart (novoLOG) injection 0-9 Units  0-9 Units Subcutaneous TID WC Adeline C Viyuoh, MD      . lactose free nutrition (BOOST PLUS) liquid 237 mL  237 mL Oral TID WC Megan Dort, PA-C      . metoprolol (LOPRESSOR) tablet 50 mg  50 mg Oral BID Adeline C Viyuoh, MD      .  morphine 2 MG/ML injection 1-4 mg  1-4 mg Intravenous Q2H PRN Megan Dort, PA-C   2 mg at 07/21/13 1841  . multivitamin with minerals tablet 1 tablet  1 tablet Oral Daily Adeline C Viyuoh, MD      . niacin (NIASPAN) CR tablet 750 mg  750 mg Oral QHS Adeline C Viyuoh, MD      . ondansetron (ZOFRAN) injection 4 mg  4 mg Intravenous Q6H PRN Megan Dort, PA-C   4 mg at 07/21/13 1841  . ondansetron (ZOFRAN) tablet 4 mg  4 mg Oral Q6H PRN Adeline C Viyuoh, MD       Or  . ondansetron (ZOFRAN) injection 4 mg  4 mg Intravenous Q6H PRN Adeline C Viyuoh, MD      . pantoprazole (PROTONIX) injection 40 mg  40 mg Intravenous Q12H Kela Millin, MD   40 mg at 07/21/13 2310  . piperacillin-tazobactam (ZOSYN) IVPB 2.25 g  2.25 g Intravenous Q8H Lauren Bajbus, RPH   2.25 g at 07/22/13 0543  . promethazine (PHENERGAN) injection 6.25-12.5 mg  6.25-12.5 mg Intravenous Q6H PRN Megan Dort, PA-C      .  sodium chloride 0.9 % injection 3 mL  3 mL Intravenous Q12H Kela Millin, MD   3 mL at 07/21/13 2245  . tamsulosin (FLOMAX) capsule 0.4 mg  0.4 mg Oral Daily Adeline Joselyn Glassman, MD        PE: General appearance: alert, cooperative and no distress Lungs: clear to auscultation bilaterally Heart: fast rate and regular rhythm Abdomen: distended, no BS Extremities: no LEE Pulses: 2+ and symmetric Skin: warm and dry Neurologic: Grossly normal  Lab Results:   Recent Labs  07/21/13 1251 07/21/13 1318 07/22/13 0500  WBC 7.3  --  6.9  HGB 17.1* 17.3* 15.0  HCT 46.9 51.0 42.6  PLT 228  --  216   BMET  Recent Labs  07/21/13 1251 07/21/13 1318 07/22/13 0500  NA 134* 133* 134*  K 3.8 3.6 4.5  CL 86* 91* 93*  CO2 25  --  27  GLUCOSE 231* 226* 152*  BUN 66* 60* 83*  CREATININE 3.59* 3.60* 4.91*  CALCIUM 9.7  --  8.6   PT/INR  Recent Labs  07/21/13 1251  LABPROT 13.8  INR 1.08   Cardiac Panel (last 3 results)  Recent Labs  07/21/13 1811 07/21/13 2300 07/22/13 0600  TROPONINI 0.34* <0.30  <0.30    Assessment/Plan    Principal Problem:   SBO (small bowel obstruction) Active Problems:   DIABETES MELLITUS, TYPE II   Morbid obesity   Hypotension, unspecified   Elevated troponin I level   CAD (coronary artery disease), hx of CABG 1997 X 6. Last nuc 2012 negative.   Inguinal hernia, Rt reduced, Lt present   Acute renal failure   S/P CABG x 6   Pneumonia, aspiration   Volume depletion  Plan: 2nd and 3rd troponin both normal. Pt continues to deny CP and SOB. We recommend continuing to treat SBO. Once condition improves, we will re-evaluate and may consider NST for risk stratification. Will continue to follow.     LOS: 1 day    Andres [name] 07/22/2013 8:28 AM  I have seen and evaluated the patient this AM along with Boyce Medici, PA. I agree with her findings, examination as well as impression recommendations.  For now, would continue Rx of SBO & ARF.  Can consider OP cardiac ischemic eval.  2nd & 3rd troponin Negative (in setting of ARF) -- would suspect that the initial increase is not ACS mediated & may in fact be related to ARF.  Currently borderline hypotensive, so no adverse effect of being off of cardiac meds.  Monitor for rebound from BB with tachycardia.  Echo pending.   Marykay Lex, M.D., M.S. [facility] [address] [place], [place]  [address]  [phone] Pager # [phone] 07/22/2013 8:46 AM

## 2013-07-22 NOTE — Progress Notes (Signed)
  Echocardiogram 2D Echocardiogram has been performed.  Andres [name] 07/22/2013, 5:54 PM

## 2013-07-22 NOTE — Progress Notes (Signed)
Patient transferred from ER on stretcher with ER RN at side. No family at bedside. Patient oriented to unit and room, instructed on callbell and placed at side.

## 2013-07-22 NOTE — Progress Notes (Signed)
Hernia remains reduced. Needs foley and likely more volume. CRT worse. Cardiology following. Patient examined and I agree with the assessment and plan  Violeta Gelinas, MD, MPH, FACS Pager: [phone]  07/22/2013 3:44 PM

## 2013-07-22 NOTE — Progress Notes (Signed)
Subjective: He does not remember vomiting or the NG tube going in last PM.  Denies any pain now.  No complaints of nausea.    Objective: Vital signs in last 24 hours: Temp:  [96.6 F (35.9 C)-98.2 F (36.8 C)] 97.7 F (36.5 C) (12/19 0806) Pulse Rate:  [87-99] 99 (12/19 0400) Resp:  [18-31] 25 (12/19 0400) BP: (86-115)/(49-77) 91/55 mmHg (12/19 0806) SpO2:  [88 %-100 %] 93 % (12/19 0400) Weight:  [103.2 kg (227 lb 8.2 oz)-103.874 kg (229 lb)] 103.2 kg (227 lb 8.2 oz) (12/18 2100) Last BM Date: 07/19/13 HE vomited again last pm and NG place, 2350 recorded from the NG Afebrile, BP down to 91/55 at 8AM. Creatinine is rising, H/H is better Intake/Output from previous day: 12/18 0701 - 12/19 0700 In: 1079 [I.V.:979; IV Piggyback:100] Out: 2350 [Emesis/NG output:2350] Intake/Output this shift: Total I/O In: -  Out: 100 [Emesis/NG output:100]  General appearance: alert, cooperative and I cannot tell if he has memory issues or is just confused.  Hard to get any history and he was having difficulty remembering what happened last PM. Resp: clear to auscultation bilaterally GI: Very distended, but soft, green bile coming from the NG, no flatus history from the patient. He is not tender and denies pain. No hernia felt on exam.  Lab Results:   Recent Labs  07/21/13 1251 07/21/13 1318 07/22/13 0500  WBC 7.3  --  6.9  HGB 17.1* 17.3* 15.0  HCT 46.9 51.0 42.6  PLT 228  --  216    BMET  Recent Labs  07/21/13 1251 07/21/13 1318 07/22/13 0500  NA 134* 133* 134*  K 3.8 3.6 4.5  CL 86* 91* 93*  CO2 25  --  27  GLUCOSE 231* 226* 152*  BUN 66* 60* 83*  CREATININE 3.59* 3.60* 4.91*  CALCIUM 9.7  --  8.6   PT/INR  Recent Labs  07/21/13 1251  LABPROT 13.8  INR 1.08     Recent Labs Lab 07/21/13 1251  AST 30  ALT 22  ALKPHOS 63  BILITOT 1.2  PROT 7.1  ALBUMIN 4.0     Lipase  No results found for this basename: lipase     Studies/Results: Ct Abdomen Pelvis  Wo Contrast  07/21/2013   CLINICAL DATA:  Chest pain.  Shortness of breath.  EXAM: CT CHEST WITHOUT CONTRAST; CT ABDOMEN AND PELVIS WITHOUT CONTRAST  TECHNIQUE: Multidetector CT imaging of the chest was performed following the standard protocol without IV contrast.; Multidetector CT imaging of the abdomen and pelvis was performed following the standard protocol without intravenous contrast.  COMPARISON:  None.  FINDINGS: CT chest:  No evidence of mediastinal or hilar adenopathy. The thoracic esophagus is severely dilated. A hiatal hernia is present. The hiatal hernia is fluid filled. There is mild thickening of the gastroesophageal junction. Prior CABG. Heart size normal. Large airways are patent. Focal bilateral small ill-defined pulmonary infiltrates are present throughout both lung fields. Although metastatic disease cannot be excluded, these are most likely infectious. Typical and atypical infection including granulomatous disease should be considered. Multifocal small areas of aspiration could present in this fashion .  CT abdomen pelvis:  Liver is normal. Spleen is normal. Pancreas is normal. Gallbladder is normal. No biliary distention.  Adrenals normal. Kidneys normal. No hydronephrosis. No evidence of obstructing ureteral stone. Bladder is nondistended. Mild prostate enlargement. Phleboliths in pelvis.  Right inguinal hernia is present with herniation of what appears to be a distal loop of small bowel.  Proximal bowel severe distention is present. Distal bowel is collapsed. Associated gastric distension is present. As noted above esophagus is distended. These findings are consistent with severe distal small bowel obstruction secondary to herniation into the inguinal canal on the right. Bilateral hydroceles are noted. There is a left inguinal hernia left with herniation of fat only. Appendix not definitely identified. Sigmoid colonic diverticulosis. No evidence of diverticulitis. No pneumatosis or free air  identified.  No adenopathy. Atherosclerotic vascular changes noted of the abdominal aorta.  No acute bony abnormality identified. Thoracolumbar degenerative changes i present.  IMPRESSION: 1. Right inguinal hernia with herniation of the distal small bowel loop with severe obstruction of the small bowel. This results in severe distention of the small bowel and severe distention of the stomach. The esophagus is also severely distended. There is also a left inguinal hernia with herniation of fat only. Bilateral hydroceles are also noted. 2. Patchy bilateral pulmonary infiltrates consistent with pneumonitis possibly from aspiration. 3. Prior CABG. These results were called by telephone at the time of interpretation on 07/21/2013 at 2:32 PM to Dr. Raeford Razor , who verbally acknowledged these results.   Electronically Signed   By: Maisie Fus  Register   On: 07/21/2013 14:35   Ct Chest Wo Contrast  07/21/2013   CLINICAL DATA:  Chest pain.  Shortness of breath.  EXAM: CT CHEST WITHOUT CONTRAST; CT ABDOMEN AND PELVIS WITHOUT CONTRAST  TECHNIQUE: Multidetector CT imaging of the chest was performed following the standard protocol without IV contrast.; Multidetector CT imaging of the abdomen and pelvis was performed following the standard protocol without intravenous contrast.  COMPARISON:  None.  FINDINGS: CT chest:  No evidence of mediastinal or hilar adenopathy. The thoracic esophagus is severely dilated. A hiatal hernia is present. The hiatal hernia is fluid filled. There is mild thickening of the gastroesophageal junction. Prior CABG. Heart size normal. Large airways are patent. Focal bilateral small ill-defined pulmonary infiltrates are present throughout both lung fields. Although metastatic disease cannot be excluded, these are most likely infectious. Typical and atypical infection including granulomatous disease should be considered. Multifocal small areas of aspiration could present in this fashion .  CT abdomen  pelvis:  Liver is normal. Spleen is normal. Pancreas is normal. Gallbladder is normal. No biliary distention.  Adrenals normal. Kidneys normal. No hydronephrosis. No evidence of obstructing ureteral stone. Bladder is nondistended. Mild prostate enlargement. Phleboliths in pelvis.  Right inguinal hernia is present with herniation of what appears to be a distal loop of small bowel. Proximal bowel severe distention is present. Distal bowel is collapsed. Associated gastric distension is present. As noted above esophagus is distended. These findings are consistent with severe distal small bowel obstruction secondary to herniation into the inguinal canal on the right. Bilateral hydroceles are noted. There is a left inguinal hernia left with herniation of fat only. Appendix not definitely identified. Sigmoid colonic diverticulosis. No evidence of diverticulitis. No pneumatosis or free air identified.  No adenopathy. Atherosclerotic vascular changes noted of the abdominal aorta.  No acute bony abnormality identified. Thoracolumbar degenerative changes i present.  IMPRESSION: 1. Right inguinal hernia with herniation of the distal small bowel loop with severe obstruction of the small bowel. This results in severe distention of the small bowel and severe distention of the stomach. The esophagus is also severely distended. There is also a left inguinal hernia with herniation of fat only. Bilateral hydroceles are also noted. 2. Patchy bilateral pulmonary infiltrates consistent with pneumonitis possibly from aspiration. 3. Prior  CABG. These results were called by telephone at the time of interpretation on 07/21/2013 at 2:32 PM to Dr. Raeford Razor , who verbally acknowledged these results.   Electronically Signed   By: Maisie Fus  Register   On: 07/21/2013 14:35   Dg Chest Portable 1 View  07/21/2013   CLINICAL DATA:  Short of breath.  Chest pain.  EXAM: PORTABLE CHEST - 1 VIEW  COMPARISON:  None.  FINDINGS: Low volume chest.  CABG. Cardiopericardial silhouette appears within normal limits. Artifact projects over the upper chest. Monitoring leads project over the chest. No airspace disease. No effusion. Subsegmental scarring in the lingula.  Retrocardiac shadow is present, likely representing fluid distended esophagus performed on contemporaneously CT.  IMPRESSION: Low volume chest. No active cardiopulmonary disease. CABG. Retrocardiac shadow likely represents fluid-filled esophagus demonstrated on CT.   Electronically Signed   By: Andreas Newport M.D.   On: 07/21/2013 14:04    Medications: . aspirin  81 mg Oral Daily  . atorvastatin  40 mg Oral Daily  . clopidogrel  75 mg Oral Daily  . enoxaparin (LOVENOX) injection  30 mg Subcutaneous Q24H  . folic acid  1 mg Oral Daily  . insulin aspart  0-5 Units Subcutaneous QHS  . insulin aspart  0-9 Units Subcutaneous TID WC  . lactose free nutrition  237 mL Oral TID WC  . metoprolol  50 mg Oral BID  . multivitamin with minerals  1 tablet Oral Daily  . niacin  750 mg Oral QHS  . pantoprazole (PROTONIX) IV  40 mg Intravenous Q12H  . piperacillin-tazobactam (ZOSYN)  IV  2.25 g Intravenous Q8H  . sodium chloride  3 mL Intravenous Q12H  . tamsulosin  0.4 mg Oral Daily   . sodium chloride 110 mL/hr at 07/21/13 2306    Assessment/Plan 1. SBO with RIH, now reduced. He also has a LIH. Ongoing SBO 2. Hypotension  3. Acute renal failure with rising creatinine. (3.6 on admit, now up to 4.91) 4. Possible aspiration pneumonia  5. CAD, s/p CABG with positive troponin (.21) on Plavix 6. Hx of hypertension  7. AODM  8. Hyperlipidemia  9. PVD  10. 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 Hx of hepatitis  12. BPH 13.  Decreased mentation/memory.  I do not know what his baseline before yesterday, but we could not get a good history yesterday either.  Plan:  Film ordered for this Am is not available yet, i have changed it to a stat.  I would continue NG drainage, I do not feel any incarceration of  bowel on exam, but he is still obstructed. We will follow closely.      LOS: 1 day    Fiona Coto 07/22/2013

## 2013-07-22 NOTE — Progress Notes (Signed)
[facility] Progress Note [place] TEAM 1 - [facility] ICU Team    Andres [name] JXB:[id number] DOB: 10-07-1940 DOA: 07/21/2013 PCP: Oliver Barre, MD  Brief narrative: 72 y.o. 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 He was dyspneic with some difficulty speaking complete sentences, diaphoretic with clammy extremities. He was found to be hypotensive at the office and sent to the ED. The pt admitted to nausea and vomiting x 2 over the past week and his last BM was 2 days prior. Pt also reported a cough productive of phlegm. He admitted to intermittent chest pressure for the past 2 days- and states that it feels like he needs to belch and was relieved some by tums/peptobismol. He was seen in ED and abd CT showed right inguinal hernia with herniation of the distal small bowel loop with severe obstruction of the small bowel.also a left inguinal hernia with herniation of fat only.  In addition patchy bilateral pulmonary infiltrates consistent with pneumonitis possibly from aspiration were seen on this CT. Surgery was consulted and the hernia was reduced. Labs showed elevated trop .21, cr 3.6. Cards was consulted.  Assessment/Plan: SBO (small bowel obstruction), secondary to incarcerated R inguinal Hernia  -hernia reduced per surgery in ED and pt was started on clears -unfortunately he re-developed emesis (500 cc) which prompted insertion of NGT and thus far has had 1300 cc dark bilious output  -abd films 12/19: decreased SB distention after placement of NGT  Hypotension, unspecified  -due to hypovolemia -improved with IVFbut still soft and associated with tachycardia so will bolus with 250 cc NS -increase IVF to 150/hr to replace GI losses (has normal EF per ECHO 12/2011)  Elevated troponin I level  -Cards following -enzymes have normalized -OP cardiac eval - ASA and  plavix on hold since NPO/NGT  Aspiration PNA  -cont emipiric anbx's  -cont supportive care  Acute renal failure  -likely d/t volume depletion secondary to SBO -may have a degree of ATN from pre admit HCTZ/ACE I -UA pending -renal US normal -hold ACE/HCTZ   CAD (coronary artery disease), hx of CABG 1997 X 6. Last nuc 2012 negative  -continue out pt meds as BP tolerates.  -echo PENDING  DM  -cont SSI -HgbAIC  6.8   DVT prophylaxis: Lovenox Code Status: Full Family Communication: Patient and niece at bedside Disposition Plan/Expected LOS: Stepdown Isolation: None Nutritional Status: Acute protein calorie malnutrition related to small bowel obstruction  Consultants: Cardiology Surgery  Procedures: 2-D echocardiogram pending  Antibiotics: Zosyn 12/18>>>  HPI/Subjective: Patient alert and endorses abdominal pain somewhat better after insertion of NG tube. Still no flatus.  Objective: Blood pressure 109/51, pulse 99, temperature 97.7 F (36.5 C), temperature source Oral, resp. rate 25, height 5\' 6"  (1.676 m), weight 227 lb 8.2 oz (103.2 kg), SpO2 93.00%.  Intake/Output Summary (Last 24 hours) at 07/22/13 1325 Last data filed at 07/22/13 1300  Gross per 24 hour  Intake   1079 ml  Output   2600 ml  Net  -1521 ml     Exam: General: No acute respiratory distress Lungs: Clear to auscultation bilaterally without wheezes or crackles, RA Cardiovascular: Regular rate and rhythm without murmur gallop or rub normal S1 and S2, no peripheral edema or JVD Abdomen: Minimal tender, distended and tympanitic, no bowel sounds positive, no rebound, no ascites, no appreciable mass; NG tube to  suction with high-volume green bilious output Musculoskeletal: No significant cyanosis, clubbing of bilateral lower extremities Neurological: Alert and oriented x 3, moves all extremities x 4 without focal neurological deficits, CN 2-12 intact  Scheduled Meds:  Scheduled Meds: . aspirin  81  mg Oral Daily  . atorvastatin  40 mg Oral Daily  . clopidogrel  75 mg Oral Daily  . enoxaparin (LOVENOX) injection  30 mg Subcutaneous Q24H  . folic acid  1 mg Oral Daily  . insulin aspart  0-5 Units Subcutaneous QHS  . insulin aspart  0-9 Units Subcutaneous TID WC  . lactose free nutrition  237 mL Oral TID WC  . metoprolol  50 mg Oral BID  . multivitamin with minerals  1 tablet Oral Daily  . niacin  750 mg Oral QHS  . pantoprazole (PROTONIX) IV  40 mg Intravenous Q12H  . piperacillin-tazobactam (ZOSYN)  IV  2.25 g Intravenous Q8H  . sodium chloride  250 mL Intravenous Once  . sodium chloride  3 mL Intravenous Q12H  . tamsulosin  0.4 mg Oral Daily   Continuous Infusions: . sodium chloride 110 mL/hr (07/22/13 1016)    **Reviewed in detail by the Attending Physician  Data Reviewed: Basic Metabolic Panel:  Recent Labs Lab 07/21/13 1251 07/21/13 1318 07/22/13 0500  NA 134* 133* 134*  K 3.8 3.6 4.5  CL 86* 91* 93*  CO2 25  --  27  GLUCOSE 231* 226* 152*  BUN 66* 60* 83*  CREATININE 3.59* 3.60* 4.91*  CALCIUM 9.7  --  8.6   Liver Function Tests:  Recent Labs Lab 07/21/13 1251  AST 30  ALT 22  ALKPHOS 63  BILITOT 1.2  PROT 7.1  ALBUMIN 4.0   No results found for this basename: LIPASE, AMYLASE,  in the last 168 hours No results found for this basename: AMMONIA,  in the last 168 hours CBC:  Recent Labs Lab 07/21/13 1251 07/21/13 1318 07/22/13 0500  WBC 7.3  --  6.9  NEUTROABS 5.8  --   --   HGB 17.1* 17.3* 15.0  HCT 46.9 51.0 42.6  MCV 95.5  --  95.3  PLT 228  --  216   Cardiac Enzymes:  Recent Labs Lab 07/21/13 1715 07/21/13 1811 07/21/13 2300 07/22/13 0600 07/22/13 1142  TROPONINI 0.33* 0.34* <0.30 <0.30 <0.30   BNP (last 3 results) No results found for this basename: PROBNP,  in the last 8760 hours CBG:  Recent Labs Lab 07/21/13 1241 07/21/13 2154 07/22/13 0742 07/22/13 1211  GLUCAP 185* 105* 150* 143*    Recent Results (from the  past 240 hour(s))  MRSA PCR SCREENING     Status: None   Collection Time    07/21/13  9:59 PM      Result Value Range Status   MRSA by PCR NEGATIVE  NEGATIVE Final   Comment:            The GeneXpert MRSA Assay (FDA     approved for NASAL specimens     only), is one component of a     comprehensive MRSA colonization     surveillance program. It is not     intended to diagnose MRSA     infection nor to guide or     monitor treatment for     MRSA infections.     Studies:  Recent x-ray studies have been reviewed in detail by the Attending Physician     Junious Silk, ANP [facility] Office  [phone] Pager [phone]  **If unable to reach the above provider after paging please contact the Flow Manager @ [phone]  On-Call/Text Page:      [email or web address]      password [email or web address]  If 7PM-7AM, please contact night-coverage [email or web address] Password [email or web address] 07/22/2013, 1:25 PM   LOS: 1 day   I have examined the patient, reviewed the chart and modified the above note which I agree with.   Brielyn Bosak,MD [phone] 07/22/2013, 5:00 PM

## 2013-07-22 NOTE — Progress Notes (Signed)
Utilization review completed.  

## 2013-07-23 ENCOUNTER — Inpatient Hospital Stay (HOSPITAL_COMMUNITY): Payer: Medicare Other

## 2013-07-23 LAB — COMPREHENSIVE METABOLIC PANEL
AST: 29 U/L (ref 0–37)
Albumin: 3 g/dL — ABNORMAL LOW (ref 3.5–5.2)
Alkaline Phosphatase: 47 U/L (ref 39–117)
BUN: 73 mg/dL — ABNORMAL HIGH (ref 6–23)
CO2: 28 mEq/L (ref 19–32)
Chloride: 102 mEq/L (ref 96–112)
GFR calc Af Amer: 18 mL/min — ABNORMAL LOW (ref >90)
GFR calc non Af Amer: 16 mL/min — ABNORMAL LOW (ref >90)
Potassium: 4 mEq/L (ref 3.5–5.1)
Total Bilirubin: 1.1 mg/dL (ref 0.3–1.2)
Total Protein: 6.1 g/dL (ref 6.0–8.3)

## 2013-07-23 LAB — GLUCOSE, CAPILLARY
Glucose-Capillary: 101 mg/dL — ABNORMAL HIGH (ref 70–99)
Glucose-Capillary: 106 mg/dL — ABNORMAL HIGH (ref 70–99)
Glucose-Capillary: 95 mg/dL (ref 70–99)
Glucose-Capillary: 97 mg/dL (ref 70–99)
Glucose-Capillary: 78 mg/dL (ref 70–99)

## 2013-07-23 LAB — URINE CULTURE
Colony Count: NO GROWTH
Culture: NO GROWTH

## 2013-07-23 LAB — CBC
HCT: 39.1 % (ref 39.0–52.0)
MCHC: 34.8 g/dL (ref 30.0–36.0)
Platelets: 185 10*3/uL (ref 150–400)
RDW: 13.3 % (ref 11.5–15.5)
WBC: 7.8 10*3/uL (ref 4.0–10.5)

## 2013-07-23 MED ORDER — METOPROLOL TARTRATE 1 MG/ML IV SOLN
2.5000 mg | Freq: Four times a day (QID) | INTRAVENOUS | Status: DC
  Administered 2013-07-23 – 2013-07-26 (×12): 2.5 mg via INTRAVENOUS
  Filled 2013-07-23 (×18): qty 5

## 2013-07-23 NOTE — Progress Notes (Signed)
[facility] Progress Note [place] TEAM 1 - [facility] ICU Team    TARA WICH WUJ:[id number] DOB: July 04, 1941 DOA: 07/21/2013 PCP: Oliver Barre, MD  Brief narrative: 72 y.o. 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 He was dyspneic with some difficulty speaking complete sentences, diaphoretic with clammy extremities. He was found to be hypotensive at the office and sent to the ED. The pt admitted to nausea and vomiting x 2 over the past week and his last BM was 2 days prior. Pt also reported a cough productive of phlegm. He admitted to intermittent chest pressure for the past 2 days- and states that it feels like he needs to belch and was relieved some by tums/peptobismol. He was seen in ED and abd CT showed right inguinal hernia with herniation of the distal small bowel loop with severe obstruction of the small bowel.also a left inguinal hernia with herniation of fat only.  In addition patchy bilateral pulmonary infiltrates consistent with pneumonitis possibly from aspiration were seen on this CT. Surgery was consulted and the hernia was reduced. Labs showed elevated trop .21, cr 3.6. Cards was consulted.  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 Last nuc 2012 negative  -continue out pt meds as BP tolerates.  - possible oupt myoview  DM  -cont SSI -HgbAIC  6.8   DVT prophylaxis: Lovenox Code Status: Full Family Communication: Patient and niece at bedside Disposition Plan/Expected LOS: Stepdown Isolation: None Nutritional Status: Acute protein calorie malnutrition related to small bowel obstruction  Consultants: Cardiology Surgery  Procedures: 2-D echocardiogram pending  Antibiotics: Zosyn 12/18>>>  HPI/Subjective: Patient alert - no abd pain or nausea- has not passed any flatus  Objective: Blood pressure 116/64, pulse 85, temperature 97.6 F (36.4 C), temperature source Oral, resp. rate 23, height 5\' 6"  (1.676 m), weight 103.2 kg (227 lb 8.2 oz), SpO2 99.00%.  Intake/Output Summary (Last 24 hours) at 07/23/13 1101 Last data filed at 07/23/13 9562  Gross per 24 hour  Intake 2042.5 ml  Output   3875 ml  Net -1832.5 ml     Exam: General: No acute respiratory distress Lungs: Clear to auscultation bilaterally without wheezes or crackles, RA Cardiovascular: Regular rate and rhythm without murmur gallop or rub normal S1 and S2, no peripheral edema or JVD Abdomen: Minimal tender, distended and tympanitic, no bowel sounds, no rebound, no ascites, no appreciable mass; NG tube to suction with high-volume green bilious output Musculoskeletal: No significant cyanosis, clubbing of bilateral lower extremities Neurological: Alert and  oriented x 3, moves all extremities x 4 without focal neurological deficits, CN 2-12 intact  Scheduled Meds:  Scheduled Meds: . enoxaparin (LOVENOX) injection  30 mg Subcutaneous Q24H  . insulin aspart  0-5 Units  Subcutaneous QHS  . insulin aspart  0-9 Units Subcutaneous TID WC  . metoprolol  2.5 mg Intravenous Q6H  . pantoprazole (PROTONIX) IV  40 mg Intravenous Q12H  . piperacillin-tazobactam (ZOSYN)  IV  2.25 g Intravenous Q8H  . sodium chloride  250 mL Intravenous Once  . sodium chloride  3 mL Intravenous Q12H   Continuous Infusions: . sodium chloride 125 mL (07/23/13 0828)    **Reviewed in detail by the Attending Physician  Data Reviewed: Basic Metabolic Panel:  Recent Labs Lab 07/21/13 1251 07/21/13 1318 07/22/13 0500 07/23/13 0410  NA 134* 133* 134* 141  K 3.8 3.6 4.5 4.0  CL 86* 91* 93* 102  CO2 25  --  27 28  GLUCOSE 231* 226* 152* 107*  BUN 66* 60* 83* 73*  CREATININE 3.59* 3.60* 4.91* 3.51*  CALCIUM 9.7  --  8.6 8.6   Liver Function Tests:  Recent Labs Lab 07/21/13 1251 07/23/13 0410  AST 30 29  ALT 22 18  ALKPHOS 63 47  BILITOT 1.2 1.1  PROT 7.1 6.1  ALBUMIN 4.0 3.0*   No results found for this basename: LIPASE, AMYLASE,  in the last 168 hours No results found for this basename: AMMONIA,  in the last 168 hours CBC:  Recent Labs Lab 07/21/13 1251 07/21/13 1318 07/22/13 0500 07/23/13 0410  WBC 7.3  --  6.9 7.8  NEUTROABS 5.8  --   --   --   HGB 17.1* 17.3* 15.0 13.6  HCT 46.9 51.0 42.6 39.1  MCV 95.5  --  95.3 96.8  PLT 228  --  216 185   Cardiac Enzymes:  Recent Labs Lab 07/21/13 1715 07/21/13 1811 07/21/13 2300 07/22/13 0600 07/22/13 1142  TROPONINI 0.33* 0.34* <0.30 <0.30 <0.30   BNP (last 3 results) No results found for this basename: PROBNP,  in the last 8760 hours CBG:  Recent Labs Lab 07/22/13 1517 07/22/13 2006 07/22/13 2332 07/23/13 0340 07/23/13 0805  GLUCAP 139* 101* 109* 101* 106*    Recent Results (from the past 240 hour(s))  MRSA PCR SCREENING     Status: None   Collection Time    07/21/13  9:59 PM      Result Value Range Status   MRSA by PCR NEGATIVE  NEGATIVE Final   Comment:            The GeneXpert MRSA  Assay (FDA     approved for NASAL specimens     only), is one component of a     comprehensive MRSA colonization     surveillance program. It is not     intended to diagnose MRSA     infection nor to guide or     monitor treatment for     MRSA infections.     Studies:  Recent x-ray studies have been reviewed in detail by the Attending Physician    Taysen Bushart,MD [phone] 07/23/2013, 11:01 AM

## 2013-07-23 NOTE — Progress Notes (Signed)
Subjective: Denies flatus this morning Denies abdominal pain  Objective: Vital signs in last 24 hours: Temp:  [97.3 F (36.3 C)-98.9 F (37.2 C)] 97.6 F (36.4 C) (12/20 0807) Pulse Rate:  [83-91] 85 (12/20 0807) Resp:  [18-28] 23 (12/20 0807) BP: (102-116)/(43-80) 116/64 mmHg (12/20 0807) SpO2:  [95 %-99 %] 99 % (12/20 0807) Last BM Date: 07/19/13  Intake/Output from previous day: 12/19 0701 - 12/20 0700 In: 2142.5 [I.V.:1642.5; IV Piggyback:500] Out: 2975 [Urine:1775; Emesis/NG output:1200] Intake/Output this shift: Total I/O In: 150 [I.V.:150] Out: 1000 [Urine:800; Emesis/NG output:200]  Abdomen distended but non tender Inguinal hernia completely reduced  Lab Results:   Recent Labs  07/22/13 0500 07/23/13 0410  WBC 6.9 7.8  HGB 15.0 13.6  HCT 42.6 39.1  PLT 216 185   BMET  Recent Labs  07/22/13 0500 07/23/13 0410  NA 134* 141  K 4.5 4.0  CL 93* 102  CO2 27 28  GLUCOSE 152* 107*  BUN 83* 73*  CREATININE 4.91* 3.51*  CALCIUM 8.6 8.6   PT/INR  Recent Labs  07/21/13 1251  LABPROT 13.8  INR 1.08   ABG No results found for this basename: PHART, PCO2, PO2, HCO3,  in the last 72 hours  Studies/Results: Ct Abdomen Pelvis Wo Contrast  07/21/2013   CLINICAL DATA:  Chest pain.  Shortness of breath.  EXAM: CT CHEST WITHOUT CONTRAST; CT ABDOMEN AND PELVIS WITHOUT CONTRAST  TECHNIQUE: Multidetector CT imaging of the chest was performed following the standard protocol without IV contrast.; Multidetector CT imaging of the abdomen and pelvis was performed following the standard protocol without intravenous contrast.  COMPARISON:  None.  FINDINGS: CT chest:  No evidence of mediastinal or hilar adenopathy. The thoracic esophagus is severely dilated. A hiatal hernia is present. The hiatal hernia is fluid filled. There is mild thickening of the gastroesophageal junction. Prior CABG. Heart size normal. Large airways are patent. Focal bilateral small ill-defined  pulmonary infiltrates are present throughout both lung fields. Although metastatic disease cannot be excluded, these are most likely infectious. Typical and atypical infection including granulomatous disease should be considered. Multifocal small areas of aspiration could present in this fashion .  CT abdomen pelvis:  Liver is normal. Spleen is normal. Pancreas is normal. Gallbladder is normal. No biliary distention.  Adrenals normal. Kidneys normal. No hydronephrosis. No evidence of obstructing ureteral stone. Bladder is nondistended. Mild prostate enlargement. Phleboliths in pelvis.  Right inguinal hernia is present with herniation of what appears to be a distal loop of small bowel. Proximal bowel severe distention is present. Distal bowel is collapsed. Associated gastric distension is present. As noted above esophagus is distended. These findings are consistent with severe distal small bowel obstruction secondary to herniation into the inguinal canal on the right. Bilateral hydroceles are noted. There is a left inguinal hernia left with herniation of fat only. Appendix not definitely identified. Sigmoid colonic diverticulosis. No evidence of diverticulitis. No pneumatosis or free air identified.  No adenopathy. Atherosclerotic vascular changes noted of the abdominal aorta.  No acute bony abnormality identified. Thoracolumbar degenerative changes i present.  IMPRESSION: 1. Right inguinal hernia with herniation of the distal small bowel loop with severe obstruction of the small bowel. This results in severe distention of the small bowel and severe distention of the stomach. The esophagus is also severely distended. There is also a left inguinal hernia with herniation of fat only. Bilateral hydroceles are also noted. 2. Patchy bilateral pulmonary infiltrates consistent with pneumonitis possibly from aspiration. 3.  Prior CABG. These results were called by telephone at the time of interpretation on 07/21/2013 at 2:32  PM to Dr. Raeford Razor , who verbally acknowledged these results.   Electronically Signed   By: Maisie Fus  Register   On: 07/21/2013 14:35   Ct Chest Wo Contrast  07/21/2013   CLINICAL DATA:  Chest pain.  Shortness of breath.  EXAM: CT CHEST WITHOUT CONTRAST; CT ABDOMEN AND PELVIS WITHOUT CONTRAST  TECHNIQUE: Multidetector CT imaging of the chest was performed following the standard protocol without IV contrast.; Multidetector CT imaging of the abdomen and pelvis was performed following the standard protocol without intravenous contrast.  COMPARISON:  None.  FINDINGS: CT chest:  No evidence of mediastinal or hilar adenopathy. The thoracic esophagus is severely dilated. A hiatal hernia is present. The hiatal hernia is fluid filled. There is mild thickening of the gastroesophageal junction. Prior CABG. Heart size normal. Large airways are patent. Focal bilateral small ill-defined pulmonary infiltrates are present throughout both lung fields. Although metastatic disease cannot be excluded, these are most likely infectious. Typical and atypical infection including granulomatous disease should be considered. Multifocal small areas of aspiration could present in this fashion .  CT abdomen pelvis:  Liver is normal. Spleen is normal. Pancreas is normal. Gallbladder is normal. No biliary distention.  Adrenals normal. Kidneys normal. No hydronephrosis. No evidence of obstructing ureteral stone. Bladder is nondistended. Mild prostate enlargement. Phleboliths in pelvis.  Right inguinal hernia is present with herniation of what appears to be a distal loop of small bowel. Proximal bowel severe distention is present. Distal bowel is collapsed. Associated gastric distension is present. As noted above esophagus is distended. These findings are consistent with severe distal small bowel obstruction secondary to herniation into the inguinal canal on the right. Bilateral hydroceles are noted. There is a left inguinal hernia left with  herniation of fat only. Appendix not definitely identified. Sigmoid colonic diverticulosis. No evidence of diverticulitis. No pneumatosis or free air identified.  No adenopathy. Atherosclerotic vascular changes noted of the abdominal aorta.  No acute bony abnormality identified. Thoracolumbar degenerative changes i present.  IMPRESSION: 1. Right inguinal hernia with herniation of the distal small bowel loop with severe obstruction of the small bowel. This results in severe distention of the small bowel and severe distention of the stomach. The esophagus is also severely distended. There is also a left inguinal hernia with herniation of fat only. Bilateral hydroceles are also noted. 2. Patchy bilateral pulmonary infiltrates consistent with pneumonitis possibly from aspiration. 3. Prior CABG. These results were called by telephone at the time of interpretation on 07/21/2013 at 2:32 PM to Dr. Raeford Razor , who verbally acknowledged these results.   Electronically Signed   By: Maisie Fus  Register   On: 07/21/2013 14:35   US Renal  07/22/2013   CLINICAL DATA:  Acute renal failure, history of hypertension and diabetes.  EXAM: RENAL/URINARY TRACT ULTRASOUND COMPLETE  COMPARISON:  None.  FINDINGS: Right Kidney:  Length: 11.1 cm. Echogenicity within normal limits. No mass or hydronephrosis visualized.  Left Kidney:  Length: 11.5 cm. Echogenicity within normal limits. No mass or hydronephrosis visualized.  Bladder:  Appears normal for degree of bladder distention.  IMPRESSION: No acute abnormality of either kidney nor of the urinary bladder is demonstrated.   Electronically Signed   By: David  [place]   On: 07/22/2013 11:40   Dg Chest Portable 1 View  07/21/2013   CLINICAL DATA:  Short of breath.  Chest pain.  EXAM: PORTABLE  CHEST - 1 VIEW  COMPARISON:  None.  FINDINGS: Low volume chest. CABG. Cardiopericardial silhouette appears within normal limits. Artifact projects over the upper chest. Monitoring leads project over  the chest. No airspace disease. No effusion. Subsegmental scarring in the lingula.  Retrocardiac shadow is present, likely representing fluid distended esophagus performed on contemporaneously CT.  IMPRESSION: Low volume chest. No active cardiopulmonary disease. CABG. Retrocardiac shadow likely represents fluid-filled esophagus demonstrated on CT.   Electronically Signed   By: Andreas Newport M.D.   On: 07/21/2013 14:04   Dg Abd 2 Views  07/22/2013   CLINICAL DATA:  Bowel obstruction.  EXAM: ABDOMEN - 2 VIEW  COMPARISON:  CT 07/21/2013.  FINDINGS: NG tube noted coiled in the stomach. Persistent distended loops of small bowel are noted. These appear slightly less distended than on prior study. No free air identified. Mild basilar atelectasis. Prior CABG.  IMPRESSION: NG tube noted coiled in stomach. Small bowel distention remains but is slightly less prominent than on prior CT of 07/21/2013.   Electronically Signed   By: Maisie Fus  Register   On: 07/22/2013 11:08    Anti-infectives: Anti-infectives   Start     Dose/Rate Route Frequency Ordered Stop   07/21/13 2200  piperacillin-tazobactam (ZOSYN) IVPB 2.25 g     2.25 g 100 mL/hr over 30 Minutes Intravenous 3 times per day 07/21/13 2120     07/21/13 1445  piperacillin-tazobactam (ZOSYN) IVPB 3.375 g     3.375 g 100 mL/hr over 30 Minutes Intravenous  Once 07/21/13 1435 07/21/13 1520      Assessment/Plan: s/p * No surgery found *  Right inguinal hernia  SBO from the hernia vs ileus.  Will continue the NG and repeat abd xrays tomorrow  LOS: 2 days    Kiara Keep A 07/23/2013

## 2013-07-23 NOTE — Progress Notes (Signed)
Subjective:  No chest pain  Objective:  Vital Signs in the last 24 hours: Temp:  [97.3 F (36.3 C)-98.9 F (37.2 C)] 97.6 F (36.4 C) (12/20 0807) Pulse Rate:  [83-91] 85 (12/20 0807) Resp:  [18-28] 23 (12/20 0807) BP: (102-116)/(43-80) 116/64 mmHg (12/20 0807) SpO2:  [95 %-99 %] 99 % (12/20 0807)  Intake/Output from previous day:  Intake/Output Summary (Last 24 hours) at 07/23/13 1010 Last data filed at 07/23/13 0807  Gross per 24 hour  Intake 2292.5 ml  Output   3875 ml  Net -1582.5 ml    Physical Exam: General appearance: alert, cooperative and no distress Lungs: clear to auscultation bilaterally Heart: regular rate and rhythm Abdomen: distended, bowel quiet   Rate: 85  Rhythm: normal sinus rhythm  Lab Results:  Recent Labs  07/22/13 0500 07/23/13 0410  WBC 6.9 7.8  HGB 15.0 13.6  PLT 216 185    Recent Labs  07/22/13 0500 07/23/13 0410  NA 134* 141  K 4.5 4.0  CL 93* 102  CO2 27 28  GLUCOSE 152* 107*  BUN 83* 73*  CREATININE 4.91* 3.51*    Recent Labs  07/22/13 0600 07/22/13 1142  TROPONINI <0.30 <0.30    Recent Labs  07/21/13 1251  INR 1.08    Imaging: Imaging results have been reviewed  Cardiac Studies:  Assessment/Plan:   Principal Problem:   SBO (small bowel obstruction) Active Problems:   Acute renal failure   Pneumonia, aspiration   DIABETES MELLITUS, TYPE II   Hypotension, unspecified   Elevated troponin I level- (pk Troponin 0.34 in setting of acute renal insufficency)   CAD (coronary artery disease), hx of CABG 1997 X 6. Last nuc 2012 negative.   Volume depletion   HYPERLIPIDEMIA   HYPERTENSION   Obesity (BMI 30-39.9)   Inguinal hernia, Rt reduced, Lt present    PLAN: Slightly elevated Troponin in the setting of acute renal insufficiency, SBO, and transient hypotension. Doubt significant MI. Echo pending. Will add Lopressor 2.5 mg IV Q 6 as he was on Lopressor 50 mg BID at home.  Corine Shelter PA-C Beeper  [phone] 07/23/2013, 10:10 AM   I have seen and examined the patient along with Corine Shelter PA-C.  I have reviewed the chart, notes and new data.  I agree with PA's note.  Key new complaints: no CV complaints Key examination changes: no clinical signs of CHF; occasional PVCs Key new findings / data: echo shows normal overall LVEF. There is inferior wall hypokinesis that corresponds to a known scar (see nuclear stress test 2012). 2+ MR  PLAN: No indication for new acute coronary syndrome. Not planning coronary angio. Will pursue further workup as outpatient - not urgent, but reasonable to reevaluate a nuclear can in this gentleman who is >17 years post CABG.  Thurmon Fair, MD, [facility] [phone] 07/23/2013, 11:52 AM

## 2013-07-24 ENCOUNTER — Inpatient Hospital Stay (HOSPITAL_COMMUNITY): Payer: Medicare Other

## 2013-07-24 DIAGNOSIS — Z01810 Encounter for preprocedural cardiovascular examination: Secondary | ICD-10-CM

## 2013-07-24 DIAGNOSIS — K4090 Unilateral inguinal hernia, without obstruction or gangrene, not specified as recurrent: Secondary | ICD-10-CM

## 2013-07-24 DIAGNOSIS — J690 Pneumonitis due to inhalation of food and vomit: Secondary | ICD-10-CM

## 2013-07-24 DIAGNOSIS — K56609 Unspecified intestinal obstruction, unspecified as to partial versus complete obstruction: Secondary | ICD-10-CM

## 2013-07-24 DIAGNOSIS — I10 Essential (primary) hypertension: Secondary | ICD-10-CM

## 2013-07-24 DIAGNOSIS — E785 Hyperlipidemia, unspecified: Secondary | ICD-10-CM

## 2013-07-24 DIAGNOSIS — E119 Type 2 diabetes mellitus without complications: Secondary | ICD-10-CM

## 2013-07-24 LAB — BASIC METABOLIC PANEL
BUN: 34 mg/dL — ABNORMAL HIGH (ref 6–23)
CO2: 27 mEq/L (ref 19–32)
Calcium: 9 mg/dL (ref 8.4–10.5)
Chloride: 109 mEq/L (ref 96–112)
Creatinine, Ser: 1.61 mg/dL — ABNORMAL HIGH (ref 0.50–1.35)
GFR calc Af Amer: 47 mL/min — ABNORMAL LOW (ref >90)
GFR calc non Af Amer: 40 mL/min — ABNORMAL LOW (ref >90)
Glucose, Bld: 104 mg/dL — ABNORMAL HIGH (ref 70–99)
Potassium: 3.7 mEq/L (ref 3.5–5.1)
Sodium: 145 mEq/L (ref 135–145)

## 2013-07-24 LAB — GLUCOSE, CAPILLARY
Glucose-Capillary: 73 mg/dL (ref 70–99)
Glucose-Capillary: 88 mg/dL (ref 70–99)
Glucose-Capillary: 84 mg/dL (ref 70–99)

## 2013-07-24 MED ORDER — PIPERACILLIN-TAZOBACTAM 3.375 G IVPB
3.3750 g | Freq: Three times a day (TID) | INTRAVENOUS | Status: AC
  Administered 2013-07-24 – 2013-07-27 (×11): 3.375 g via INTRAVENOUS
  Filled 2013-07-24 (×11): qty 50

## 2013-07-24 MED ORDER — ENOXAPARIN SODIUM 40 MG/0.4ML ~~LOC~~ SOLN
40.0000 mg | SUBCUTANEOUS | Status: DC
  Administered 2013-07-24 – 2013-08-01 (×9): 40 mg via SUBCUTANEOUS
  Filled 2013-07-24 (×14): qty 0.4

## 2013-07-24 NOTE — Progress Notes (Addendum)
[facility] Progress Note [name] TEAM 1 - [facility] ICU Team    Andres [name] YQM:[id number] DOB: 10-Jul-1941 DOA: 07/21/2013 PCP: Oliver Barre, MD  Brief narrative: 72 y.o. 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 He was dyspneic with some difficulty speaking complete sentences, diaphoretic with clammy extremities. He was found to be hypotensive at the office and sent to the ED. The pt admitted to nausea and vomiting x 2 over the past week and his last BM was 2 days prior. Pt also reported a cough productive of phlegm. He admitted to intermittent chest pressure for the past 2 days- and states that it feels like he needs to belch and was relieved some by tums/peptobismol. He was seen in ED and abd CT showed right inguinal hernia with herniation of the distal small bowel loop with severe obstruction of the small bowel.also a left inguinal hernia with herniation of fat only.  In addition patchy bilateral pulmonary infiltrates consistent with pneumonitis possibly from aspiration were seen on this CT. Surgery was consulted and the hernia was reduced. Labs showed elevated trop .21, cr 3.6. Cards was consulted.  Assessment/Plan: SBO (small bowel obstruction), secondary to incarcerated R inguinal Hernia  -hernia reduced per surgery in ED and pt was started on clears -unfortunately he re-developed emesis (500 cc) which prompted insertion of NGT  -Films reveal that he continues to have obstruction- management per surgery  Hypotension, unspecified  -due to hypovolemia -improved with aggressive IV hydration  Elevated troponin I - CAD (coronary artery disease), hx of CABG 1997 X 6. 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- discussed  with patient and daughter - no plans for further investigation by cardiology for now   Aspiration PNA  -cont emipiric anbx's  -cont supportive care  Acute renal failure  - continues to improve -likely d/t volume depletion secondary to SBO -may have a degree of ATN from pre admit HCTZ/ACE I -renal US normal -hold ACE/HCTZ   DM  -cont SSI -HgbAIC  6.8   DVT prophylaxis: Lovenox Code Status: Full Family Communication: Patient and niece at bedside Disposition Plan/Expected LOS: Stepdown Isolation: None Nutritional Status: Acute protein calorie malnutrition related to small bowel obstruction  Consultants: Cardiology Surgery  Procedures: 2-D echocardiogram pending  Antibiotics: Zosyn 12/18>>>  HPI/Subjective: Patient alert - has no complaints  Objective: Blood pressure 152/73, pulse 90, temperature 98.7 F (37.1 C), temperature source Oral, resp. rate 18, height 5\' 6"  (1.676 m), weight 103.2 kg (227 lb 8.2 oz), SpO2 95.00%.  Intake/Output Summary (Last 24 hours) at 07/24/13 1744 Last data filed at 07/24/13 1640  Gross per 24 hour  Intake 3308.33 ml  Output   4450 ml  Net -1141.67 ml     Exam: General: No acute respiratory distress Lungs: Clear to auscultation bilaterally without wheezes or crackles, RA Cardiovascular: Regular rate and rhythm without murmur gallop or rub normal S1 and S2, no peripheral edema or JVD Abdomen: Minimal tender, distended and tympanitic, no bowel sounds, no rebound, no ascites, no appreciable mass; NG tube to suction with high-volume green bilious output Musculoskeletal: No significant cyanosis, clubbing of bilateral lower extremities Neurological: Alert and oriented x 3, moves all extremities x 4 without focal neurological deficits, CN  2-12 intact  Scheduled Meds:  Scheduled Meds: . enoxaparin (LOVENOX) injection  40 mg Subcutaneous Q24H  . insulin aspart  0-5 Units Subcutaneous QHS  . insulin aspart  0-9 Units Subcutaneous TID WC    . metoprolol  2.5 mg Intravenous Q6H  . pantoprazole (PROTONIX) IV  40 mg Intravenous Q12H  . piperacillin-tazobactam (ZOSYN)  IV  3.375 g Intravenous Q8H  . sodium chloride  250 mL Intravenous Once  . sodium chloride  3 mL Intravenous Q12H   Continuous Infusions: . sodium chloride 125 mL/hr at 07/24/13 0528    **Reviewed in detail by the Attending Physician  Data Reviewed: Basic Metabolic Panel:  Recent Labs Lab 07/21/13 1251 07/21/13 1318 07/22/13 0500 07/23/13 0410 07/24/13 0600  NA 134* 133* 134* 141 145  K 3.8 3.6 4.5 4.0 3.7  CL 86* 91* 93* 102 109  CO2 25  --  27 28 27   GLUCOSE 231* 226* 152* 107* 104*  BUN 66* 60* 83* 73* 34*  CREATININE 3.59* 3.60* 4.91* 3.51* 1.61*  CALCIUM 9.7  --  8.6 8.6 9.0   Liver Function Tests:  Recent Labs Lab 07/21/13 1251 07/23/13 0410  AST 30 29  ALT 22 18  ALKPHOS 63 47  BILITOT 1.2 1.1  PROT 7.1 6.1  ALBUMIN 4.0 3.0*   No results found for this basename: LIPASE, AMYLASE,  in the last 168 hours No results found for this basename: AMMONIA,  in the last 168 hours CBC:  Recent Labs Lab 07/21/13 1251 07/21/13 1318 07/22/13 0500 07/23/13 0410  WBC 7.3  --  6.9 7.8  NEUTROABS 5.8  --   --   --   HGB 17.1* 17.3* 15.0 13.6  HCT 46.9 51.0 42.6 39.1  MCV 95.5  --  95.3 96.8  PLT 228  --  216 185   Cardiac Enzymes:  Recent Labs Lab 07/21/13 1715 07/21/13 1811 07/21/13 2300 07/22/13 0600 07/22/13 1142  TROPONINI 0.33* 0.34* <0.30 <0.30 <0.30   BNP (last 3 results) No results found for this basename: PROBNP,  in the last 8760 hours CBG:  Recent Labs Lab 07/23/13 1758 07/23/13 2031 07/23/13 2328 07/24/13 0441 07/24/13 0827  GLUCAP 97 78 107* 81 73    Recent Results (from the past 240 hour(s))  MRSA PCR SCREENING     Status: None   Collection Time    07/21/13  9:59 PM      Result Value Range Status   MRSA by PCR NEGATIVE  NEGATIVE Final   Comment:            The GeneXpert MRSA Assay (FDA      approved for NASAL specimens     only), is one component of a     comprehensive MRSA colonization     surveillance program. It is not     intended to diagnose MRSA     infection nor to guide or     monitor treatment for     MRSA infections.  URINE CULTURE     Status: None   Collection Time    07/22/13  4:24 PM      Result Value Range Status   Specimen Description URINE, CATHETERIZED   Final   Special Requests NONE   Final   Culture  Setup Time     Final   Value: 07/22/2013 17:18     Performed at [facility]   Colony Count     Final   Value: NO GROWTH  Performed at [employer] [facility]     Final   Value: NO GROWTH     Performed at [facility]   Report Status 07/23/2013 FINAL   Final     Studies:  Recent x-ray studies have been reviewed in detail by the Attending Physician    Nadalyn Deringer,MD [phone] 07/24/2013, 5:44 PM

## 2013-07-24 NOTE — Progress Notes (Signed)
Subjective: Pt still with no bowel function.  Objective: Vital signs in last 24 hours: Temp:  [97.5 F (36.4 C)-98.6 F (37 C)] 97.5 F (36.4 C) (12/21 0840) Pulse Rate:  [25-93] 25 (12/21 0840) Resp:  [14-23] 14 (12/21 0840) BP: (119-146)/(64-84) 131/64 mmHg (12/21 0840) SpO2:  [96 %-100 %] 100 % (12/21 0840) Last BM Date: 07/19/13  Intake/Output from previous day: 12/20 0701 - 12/21 0700 In: 3200 [I.V.:2900; IV Piggyback:300] Out: 5625 [Urine:3625; Emesis/NG output:2000] Intake/Output this shift: Total I/O In: -  Out: 600 [Emesis/NG output:600]  General appearance: alert and cooperative GI: bowel sounds present, no tinkles/rushes, hernia reduced  Lab Results:   Recent Labs  07/22/13 0500 07/23/13 0410  WBC 6.9 7.8  HGB 15.0 13.6  HCT 42.6 39.1  PLT 216 185   BMET  Recent Labs  07/23/13 0410 07/24/13 0600  NA 141 145  K 4.0 3.7  CL 102 109  CO2 28 27  GLUCOSE 107* 104*  BUN 73* 34*  CREATININE 3.51* 1.61*  CALCIUM 8.6 9.0   PT/INR  Recent Labs  07/21/13 1251  LABPROT 13.8  INR 1.08   ABG No results found for this basename: PHART, PCO2, PO2, HCO3,  in the last 72 hours  Studies/Results: US Renal  07/22/2013   CLINICAL DATA:  Acute renal failure, history of hypertension and diabetes.  EXAM: RENAL/URINARY TRACT ULTRASOUND COMPLETE  COMPARISON:  None.  FINDINGS: Right Kidney:  Length: 11.1 cm. Echogenicity within normal limits. No mass or hydronephrosis visualized.  Left Kidney:  Length: 11.5 cm. Echogenicity within normal limits. No mass or hydronephrosis visualized.  Bladder:  Appears normal for degree of bladder distention.  IMPRESSION: No acute abnormality of either kidney nor of the urinary bladder is demonstrated.   Electronically Signed   By: David  [place]   On: 07/22/2013 11:40   Dg Abd 2 Views  07/22/2013   CLINICAL DATA:  Bowel obstruction.  EXAM: ABDOMEN - 2 VIEW  COMPARISON:  CT 07/21/2013.  FINDINGS: NG tube noted coiled in the  stomach. Persistent distended loops of small bowel are noted. These appear slightly less distended than on prior study. No free air identified. Mild basilar atelectasis. Prior CABG.  IMPRESSION: NG tube noted coiled in stomach. Small bowel distention remains but is slightly less prominent than on prior CT of 07/21/2013.   Electronically Signed   By: Maisie Fus  Register   On: 07/22/2013 11:08   Dg Abd Portable 1v  07/24/2013   CLINICAL DATA:  Diabetes.  Inguinal hernia.  EXAM: PORTABLE ABDOMEN - 1 VIEW  COMPARISON:  Abdomen 07/23/2013.  CT 07/21/2013.  FINDINGS: Upper most portion of the abdomen was not imaged. NG tube noted ,tip projected over the distal stomach. There is persistent small bowel distention. Colonic gas pattern is nonspecific. Vascular calcification noted. Degenerative changes lumbar spine and both hips.  IMPRESSION: Persistent small bowel distention. NG tube noted projected over distal stomach.   Electronically Signed   By: Maisie Fus  Register   On: 07/24/2013 08:38   Dg Abd Portable 1v  07/23/2013   CLINICAL DATA:  Ileus.  EXAM: PORTABLE ABDOMEN - 1 VIEW  COMPARISON:  Abdominal series of July 22, 2013.  FINDINGS: There remain loops of mildly distended gas-filled small bowel stacked in the mid abdomen. The nasogastric tube tip in proximal port lies in the region of the distal gastric body and/or antral region. There is no significant colonic distention. A small amount of gas in the right colon and in the rectum  is noted.  IMPRESSION: There is a fairly stable appearance of the small bowel ileus versus distal obstruction.   Electronically Signed   By: David  [place]   On: 07/23/2013 10:16    Anti-infectives: Anti-infectives   Start     Dose/Rate Route Frequency Ordered Stop   07/21/13 2200  piperacillin-tazobactam (ZOSYN) IVPB 2.25 g     2.25 g 100 mL/hr over 30 Minutes Intravenous 3 times per day 07/21/13 2120     07/21/13 1445  piperacillin-tazobactam (ZOSYN) IVPB 3.375 g     3.375  g 100 mL/hr over 30 Minutes Intravenous  Once 07/21/13 1435 07/21/13 1520      Assessment/Plan: s/p * No surgery found * Con't NGT at this time Pt may require RIHR this hosp stay.  Would appreciate Cardiology input in regards to cardiac risk should he need a surgery this hosp stay   LOS: 3 days    Marigene Ehlers., [facility] 07/24/2013

## 2013-07-24 NOTE — Progress Notes (Signed)
Subjective:  Still no BM or flatus  Objective:  Vital Signs in the last 24 hours: Temp:  [97.5 F (36.4 C)-98.6 F (37 C)] 97.5 F (36.4 C) (12/21 0840) Pulse Rate:  [25-93] 25 (12/21 0840) Resp:  [14-23] 14 (12/21 0840) BP: (119-146)/(64-84) 131/64 mmHg (12/21 0840) SpO2:  [96 %-100 %] 100 % (12/21 0840)  Intake/Output from previous day:  Intake/Output Summary (Last 24 hours) at 07/24/13 1007 Last data filed at 07/24/13 0840  Gross per 24 hour  Intake   2800 ml  Output   5225 ml  Net  -2425 ml    Physical Exam: General appearance: alert, cooperative and no distress Lungs: clear to auscultation bilaterally Heart: regular rate and rhythm Abdomen: bowel quiet   Rate: 88  Rhythm: normal sinus rhythm  Lab Results:  Recent Labs  07/22/13 0500 07/23/13 0410  WBC 6.9 7.8  HGB 15.0 13.6  PLT 216 185    Recent Labs  07/23/13 0410 07/24/13 0600  NA 141 145  K 4.0 3.7  CL 102 109  CO2 28 27  GLUCOSE 107* 104*  BUN 73* 34*  CREATININE 3.51* 1.61*    Recent Labs  07/22/13 0600 07/22/13 1142  TROPONINI <0.30 <0.30    Recent Labs  07/21/13 1251  INR 1.08    Imaging: Imaging results have been reviewed  Cardiac Studies: 2D- - Left ventricle: The cavity size was normal. Wall thickness was normal. Systolic function was normal. The estimated ejection fraction was in the range of 60% to 65%. Possible hypokinesis of the basal-midinferior myocardium. Left ventricular diastolic function parameters were normal. - Ventricular septum: Septal motion showed paradox. These changes are consistent with a post-thoracotomy state. - Mitral valve: Prolapse, involving the posterior leaflet. Mild to moderate regurgitation directed eccentrically and anteriorly. Impressions:    Assessment/Plan:   Principal Problem:   SBO (small bowel obstruction) Active Problems:   Acute renal failure   Pneumonia, aspiration   DIABETES MELLITUS, TYPE II   Hypotension,  unspecified   Elevated troponin I level- (pk Troponin 0.34 in setting of acute renal insufficency)   CAD (coronary artery disease), hx of CABG 1997 X 6. Last nuc 2012 negative.   Volume depletion   HYPERLIPIDEMIA   HYPERTENSION   Obesity (BMI 30-39.9)   Inguinal hernia, Rt reduced, Lt present    PLAN: Asked to see again for pre-op clearance- MD to see.   Corine Shelter PA-C Beeper [phone] 07/24/2013, 10:07 AM   I have seen and examined the patient along with Corine Shelter PA-C.  I have reviewed the chart, notes and new data.  I agree with PA's note.  PLAN: If abdominal surgery is planned, we should accelerate plans for coronary ischemia evaluation. Will check a Lexiscan Myoview in AM.  Thurmon Fair, MD, [facility] [phone] 07/24/2013, 11:16 AM

## 2013-07-24 NOTE — Progress Notes (Signed)
ANTIBIOTIC CONSULT NOTE - FOLLOW UP  Pharmacy Consult for Zosyn Indication: rule out pneumonia  No Known Allergies  Patient Measurements: Height: 5\' 6"  (167.6 cm) Weight: 227 lb 8.2 oz (103.2 kg) IBW/kg (Calculated) : 63.8   Vital Signs: Temp: 97.5 F (36.4 C) (12/21 0840) Temp src: Oral (12/21 0840) BP: 131/64 mmHg (12/21 0840) Pulse Rate: 25 (12/21 0840) Intake/Output from previous day: 12/20 0701 - 12/21 0700 In: 3200 [I.V.:2900; IV Piggyback:300] Out: 5625 [Urine:3625; Emesis/NG output:2000] Intake/Output from this shift: Total I/O In: -  Out: 600 [Emesis/NG output:600]  Labs:  Recent Labs  07/21/13 1251 07/21/13 1318 07/22/13 0500 07/23/13 0410 07/24/13 0600  WBC 7.3  --  6.9 7.8  --   HGB 17.1* 17.3* 15.0 13.6  --   PLT 228  --  216 185  --   CREATININE 3.59* 3.60* 4.91* 3.51* 1.61*   Estimated Creatinine Clearance: 45.3 ml/min (by C-G formula based on Cr of 1.61). No results found for this basename: VANCOTROUGH, Leodis Binet, VANCORANDOM, GENTTROUGH, GENTPEAK, GENTRANDOM, TOBRATROUGH, TOBRAPEAK, TOBRARND, AMIKACINPEAK, AMIKACINTROU, AMIKACIN,  in the last 72 hours   Microbiology: Recent Results (from the past 720 hour(s))  MRSA PCR SCREENING     Status: None   Collection Time    07/21/13  9:59 PM      Result Value Range Status   MRSA by PCR NEGATIVE  NEGATIVE Final   Comment:            The GeneXpert MRSA Assay (FDA     approved for NASAL specimens     only), is one component of a     comprehensive MRSA colonization     surveillance program. It is not     intended to diagnose MRSA     infection nor to guide or     monitor treatment for     MRSA infections.  URINE CULTURE     Status: None   Collection Time    07/22/13  4:24 PM      Result Value Range Status   Specimen Description URINE, CATHETERIZED   Final   Special Requests NONE   Final   Culture  Setup Time     Final   Value: 07/22/2013 17:18     Performed at [facility]  Count     Final   Value: NO GROWTH     Performed at [facility]   Culture     Final   Value: NO GROWTH     Performed at [facility]   Report Status 07/23/2013 FINAL   Final    Medical History: Past Medical History  Diagnosis Date  . Hyperlipidemia   . Hypertension   . Hepatitis   . Obesity, morbid   . Benign prostatic hypertrophy   . Cataracts, bilateral   . Carpal tunnel syndrome, bilateral   . Diverticulosis of colon   . Nephrolithiasis   . Low back pain   . ALLERGIC RHINITIS   . Head mass   . Diabetes mellitus type II   . Coronary artery disease     Dr. Tresa Endo; 2D ECHO, 12/11/2011 - EF 50-55%, normal; NUCLEAR STRESS TEST, 10/16/2010 - no evidemce of inducible ischemia  . 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 Last nuc 2012 negative. 07/21/2013  .  Inguinal hernia, Rt reduced, Lt present 07/21/2013  . Stroke   . GERD (gastroesophageal reflux disease)   . H/O hiatal hernia     Assessment: 79 YOM admitted with SOB. Chest CT shows "patchy bilateral pulmonary infiltrates consistent with pneumonitis possibly from aspiration". Zosyn started 12/18 for empiric coverage.   SCr has continued to improve and is now 1.61 with est CrCl ~42mL/min. Good urine output yesterday (1.5 ml/kg/hr).  WBC WNL as of 12/20, and patient remains afebrile.   12/19 urine cultures >> no growth  Goal of Therapy:  Eradication of infection  Plan:  1. Adjust Zosyn to 3.375gm IV q8h (4 hour infusion) 2. Follow up renal function, length of therapy, clinical progression  Nicholas Ossa C. Desaree Downen, PharmD Clinical Pharmacist-Resident Pager: [phone] Pharmacy: [phone] 07/24/2013 10:11 AM

## 2013-07-25 ENCOUNTER — Inpatient Hospital Stay (HOSPITAL_COMMUNITY): Payer: Medicare Other

## 2013-07-25 DIAGNOSIS — K56609 Unspecified intestinal obstruction, unspecified as to partial versus complete obstruction: Secondary | ICD-10-CM

## 2013-07-25 LAB — GLUCOSE, CAPILLARY
Glucose-Capillary: 107 mg/dL — ABNORMAL HIGH (ref 70–99)
Glucose-Capillary: 95 mg/dL (ref 70–99)
Glucose-Capillary: 95 mg/dL (ref 70–99)

## 2013-07-25 LAB — BASIC METABOLIC PANEL
CO2: 26 mEq/L (ref 19–32)
Calcium: 9.2 mg/dL (ref 8.4–10.5)
Chloride: 110 mEq/L (ref 96–112)
GFR calc non Af Amer: 54 mL/min — ABNORMAL LOW (ref >90)
Sodium: 147 mEq/L — ABNORMAL HIGH (ref 135–145)

## 2013-07-25 MED ORDER — SODIUM CHLORIDE 0.9 % IJ SOLN
10.0000 mL | INTRAMUSCULAR | Status: DC | PRN
  Administered 2013-07-25 – 2013-07-29 (×2): 10 mL

## 2013-07-25 MED ORDER — SODIUM CHLORIDE 0.9 % IJ SOLN
10.0000 mL | Freq: Two times a day (BID) | INTRAMUSCULAR | Status: DC
  Administered 2013-07-25 – 2013-07-26 (×2): 10 mL

## 2013-07-25 MED ORDER — SODIUM CHLORIDE 0.9 % IV SOLN
INTRAVENOUS | Status: AC
  Administered 2013-07-25 – 2013-07-26 (×3): via INTRAVENOUS

## 2013-07-25 MED ORDER — REGADENOSON 0.4 MG/5ML IV SOLN
INTRAVENOUS | Status: AC
  Filled 2013-07-25: qty 5

## 2013-07-25 MED ORDER — REGADENOSON 0.4 MG/5ML IV SOLN
0.4000 mg | Freq: Once | INTRAVENOUS | Status: AC
  Administered 2013-07-25: 0.4 mg via INTRAVENOUS
  Filled 2013-07-25: qty 5

## 2013-07-25 MED ORDER — TECHNETIUM TC 99M SESTAMIBI GENERIC - CARDIOLITE
10.0000 | Freq: Once | INTRAVENOUS | Status: AC | PRN
  Administered 2013-07-25: 10 via INTRAVENOUS

## 2013-07-25 MED ORDER — INSULIN ASPART 100 UNIT/ML ~~LOC~~ SOLN
0.0000 [IU] | Freq: Four times a day (QID) | SUBCUTANEOUS | Status: DC
  Administered 2013-07-25: via SUBCUTANEOUS
  Administered 2013-07-26: 1 [IU] via SUBCUTANEOUS
  Administered 2013-07-27: 2 [IU] via SUBCUTANEOUS
  Administered 2013-07-27 (×2): 1 [IU] via SUBCUTANEOUS
  Administered 2013-07-28 (×2): 2 [IU] via SUBCUTANEOUS

## 2013-07-25 MED ORDER — TRACE MINERALS CR-CU-F-FE-I-MN-MO-SE-ZN IV SOLN
INTRAVENOUS | Status: AC
  Administered 2013-07-25: 18:00:00 via INTRAVENOUS
  Filled 2013-07-25: qty 1000

## 2013-07-25 MED ORDER — FAT EMULSION 20 % IV EMUL
250.0000 mL | INTRAVENOUS | Status: AC
  Administered 2013-07-25: 250 mL via INTRAVENOUS
  Administered 2013-07-26: 5 mL via INTRAVENOUS
  Filled 2013-07-25: qty 250

## 2013-07-25 MED ORDER — TECHNETIUM TC 99M SESTAMIBI GENERIC - CARDIOLITE
30.0000 | Freq: Once | INTRAVENOUS | Status: AC | PRN
  Administered 2013-07-25: 30 via INTRAVENOUS

## 2013-07-25 NOTE — Progress Notes (Signed)
Pt. Had small soft BM per rectum.

## 2013-07-25 NOTE — Progress Notes (Signed)
Subjective:  Still NPO with  Objective:  Vital Signs in the last 24 hours: Temp:  [97.7 F (36.5 C)-98.7 F (37.1 C)] 98.2 F (36.8 C) (12/22 0800) Pulse Rate:  [89-95] 91 (12/22 0800) Resp:  [18-23] 23 (12/22 0800) BP: (135-157)/(65-80) 150/74 mmHg (12/22 1121) SpO2:  [95 %-100 %] 95 % (12/22 0800)  Intake/Output from previous day:  Intake/Output Summary (Last 24 hours) at 07/25/13 1136 Last data filed at 07/25/13 0900  Gross per 24 hour  Intake   3225 ml  Output   4455 ml  Net  -1230 ml    Physical Exam: General appearance: alert, cooperative and no distress Lungs: clear to auscultation bilaterally Heart: regular rate and rhythm Abd: Few bowel sounds noted  Rate: 105  Rhythm: normal sinus rhythm, PVCs  Lab Results:  Recent Labs  07/23/13 0410  WBC 7.8  HGB 13.6  PLT 185    Recent Labs  07/24/13 0600 07/25/13 0420  NA 145 147*  K 3.7 3.9  CL 109 110  CO2 27 26  GLUCOSE 104* 97  BUN 34* 23  CREATININE 1.61* 1.27    Recent Labs  07/22/13 1142  TROPONINI <0.30   No results found for this basename: INR,  in the last 72 hours  Imaging: Imaging results have been reviewed  Cardiac Studies:  Assessment/Plan:   Principal Problem:   SBO (small bowel obstruction) Active Problems:   Acute renal failure   Pneumonia, aspiration   DIABETES MELLITUS, TYPE II   Hypotension, unspecified   Elevated troponin I level- (pk Troponin 0.34 in setting of acute renal insufficency)   CAD (coronary artery disease), hx of CABG 1997 X 6. Last nuc 2012 negative.   Volume depletion   HYPERLIPIDEMIA   HYPERTENSION   Obesity (BMI 30-39.9)   Inguinal hernia, Rt reduced, Lt present    PLAN: Myoview today for pre op clearance  Quintella Baton [phone] 07/25/2013, 11:36 AM   I have seen and examined the patient along with Corine Shelter PA-C.  I have reviewed the chart, notes and new data.  I agree with PA's note.  Have also reviewed the nuclear  myocardial perfusion study. In my opinion, the inferolateral wall abnormality represents a true scar rather than artifact (corresponds to scar described in 2012 and echo wall motion abnormality), but I agree that there is no ischemia and LVEF is preserved.   PLAN: Low to moderately increased risk of CV complications with planned abdominal surgery. Invasive evaluation is not planned at this time. It is important to continue current beta blocker regimen without interruption throughout the periop period (unless of course, he develops marked bradycardia or hypotension). Switch back to PO once able to to do so.  Thurmon Fair, MD, [facility] [phone] 07/25/2013, 4:07 PM

## 2013-07-25 NOTE — Progress Notes (Signed)
Pt found in room by sink getting water to drink, pt pulled out both IVs, unhooked monitors and NGT. NGT still in place. Pt alert and oriented. Pt put back in bed with bed alarm on, instructed on importance of staying in bed and not getting up without assistance, also the reason he could not have anything to drink. IVF paused and IV team paged for new IV. Will continue to monitor.

## 2013-07-25 NOTE — Progress Notes (Signed)
[facility] Progress Note [place] TEAM 1 - [facility] ICU Team   Andres [name] ZOX:[id number] DOB: 1941-04-10 DOA: 07/21/2013 PCP: Oliver Barre, MD  Brief narrative: 72 y.o. male who initially presented at his PCP's office with a 1week h/o intermittent right groin pain (has known small RIH), as well as 2-3 days onset hoarseness. He was dyspneic with some difficulty speaking complete sentences, diaphoretic with clammy extremities. He was found to be hypotensive at the office and sent to the ED. The pt admitted to nausea and vomiting x 2 over the past week and his last BM was 2 days prior. Pt also reported a cough productive of phlegm. He admitted to intermittent chest pressure for the past 2 days - and stated that it felt like he needed to belch and was relieved some by tums/peptobismol.   He was seen in ED and abd CT showed right inguinal hernia with herniation of the distal small bowel loop with severe obstruction of the small bowel - also a left inguinal hernia with herniation of fat only.  In addition patchy bilateral pulmonary infiltrates consistent with pneumonitis possibly from aspiration were seen on this CT. Surgery was consulted and the hernia was reduced. Labs showed elevated trop .21, cr 3.6. Cards was consulted.  Assessment/Plan:  SBO secondary to incarcerated R inguinal Hernia  -hernia reduced per surgery in ED and pt was started on clears -unfortunately he re-developed emesis (500 cc) which prompted insertion of NGT  -Films reveal that he continues to have obstruction - management per Surgery  -will have PICC placed and ask pharmacy to dose TNA-12/22 PICC unable to be placed today but pt/dtr amenable to wait until 12/23 since less risk than CL and non emergent nature to line  Hypotension -resolved after aggressive IV hydration -due to hypovolemia  Elevated troponin - CAD hx of CABG 1997 X 6 -Last nuc 2012 negative -Cards following-Myoview 12/22 for pre op clearance  negative for ischemia -enzymes have normalized -ASA and plavix on hold since NPO/NGT  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- discussed with patient and daughter - no plans for further investigation by Cardiology for now   Aspiration PNA  -cont emipiric anbx's  -cont supportive care -currently stable on RA  Acute renal failure  -continues to improve -likely d/t volume depletion secondary to SBO -may have had a degree of ATN from pre admit HCTZ/ACE I -renal US normal -hold ACE/HCTZ   DM  -cont SSI -HgbAIC  6.8  DVT prophylaxis: Lovenox Code Status: Full Family Communication: Patient and daughter for >20 minutes Disposition Plan/Expected LOS: Stepdown  Consultants: Cardiology Surgery  Procedures: 2-D echocardiogram  - Left ventricle: The cavity size was normal. Wall thickness was normal. Systolic function was normal. The estimated ejection fraction was in the range of 60% to 65%. Possible hypokinesis of the basal-midinferior myocardium. Left ventricular diastolic function parameters were normal. - Ventricular septum: Septal motion showed paradox. These changes are consistent with a post-thoracotomy state. - Mitral valve: Prolapse, involving the posterior leaflet. Mild to moderate regurgitation directed eccentrically and anteriorly. Impressions: - Consider TEE to better evaluate the mitral valve.  Antibiotics: Zosyn 12/18>>>  HPI/Subjective: Patient alert - has no complaints-apparently frustrated overnite due to ongoing NPO status- got OOB unassisted and went to sink to drink water- importance of adhering to treatment regimen including NPO explained to pt by attending MD.  Objective: Blood pressure 163/70, pulse 91, temperature 98.2 F (36.8 C), temperature source Oral, resp. rate 23,  height 5\' 6"  (1.676 m), weight 227 lb 8.2 oz (103.2 kg), SpO2 95.00%.  Intake/Output Summary (Last 24 hours) at 07/25/13 1223 Last data filed at 07/25/13 0900   Gross per 24 hour  Intake   3225 ml  Output   3780 ml  Net   -555 ml    Exam: General: No acute respiratory distress Lungs: Clear to auscultation bilaterally without wheezes or crackles, RA Cardiovascular: Regular rate and rhythm without murmur gallop or rub normal S1 and S2, no peripheral edema or JVD Abdomen: Minimally tender, less distended and non tympanitic, no bowel sounds, no rebound, no ascites, no appreciable mass; NG tube to suction with green bilious output Musculoskeletal: No significant cyanosis, clubbing of bilateral lower extremities Neurological: Alert and oriented x 3, moves all extremities x 4 without focal neurological deficits, CN 2-12 intact  Scheduled Meds:  Scheduled Meds: . enoxaparin (LOVENOX) injection  40 mg Subcutaneous Q24H  . insulin aspart  0-5 Units Subcutaneous QHS  . insulin aspart  0-9 Units Subcutaneous TID WC  . metoprolol  2.5 mg Intravenous Q6H  . pantoprazole (PROTONIX) IV  40 mg Intravenous Q12H  . piperacillin-tazobactam (ZOSYN)  IV  3.375 g Intravenous Q8H  . regadenoson      . sodium chloride  250 mL Intravenous Once  . sodium chloride  3 mL Intravenous Q12H    Data Reviewed: Basic Metabolic Panel:  Recent Labs Lab 07/21/13 1251 07/21/13 1318 07/22/13 0500 07/23/13 0410 07/24/13 0600 07/25/13 0420  NA 134* 133* 134* 141 145 147*  K 3.8 3.6 4.5 4.0 3.7 3.9  CL 86* 91* 93* 102 109 110  CO2 25  --  27 28 27 26   GLUCOSE 231* 226* 152* 107* 104* 97  BUN 66* 60* 83* 73* 34* 23  CREATININE 3.59* 3.60* 4.91* 3.51* 1.61* 1.27  CALCIUM 9.7  --  8.6 8.6 9.0 9.2   Liver Function Tests:  Recent Labs Lab 07/21/13 1251 07/23/13 0410  AST 30 29  ALT 22 18  ALKPHOS 63 47  BILITOT 1.2 1.1  PROT 7.1 6.1  ALBUMIN 4.0 3.0*   No results found for this basename: LIPASE, AMYLASE,  in the last 168 hours No results found for this basename: AMMONIA,  in the last 168 hours  CBC:  Recent Labs Lab 07/21/13 1251 07/21/13 1318  07/22/13 0500 07/23/13 0410  WBC 7.3  --  6.9 7.8  NEUTROABS 5.8  --   --   --   HGB 17.1* 17.3* 15.0 13.6  HCT 46.9 51.0 42.6 39.1  MCV 95.5  --  95.3 96.8  PLT 228  --  216 185   Cardiac Enzymes:  Recent Labs Lab 07/21/13 1715 07/21/13 1811 07/21/13 2300 07/22/13 0600 07/22/13 1142  TROPONINI 0.33* 0.34* <0.30 <0.30 <0.30   CBG:  Recent Labs Lab 07/24/13 2007 07/25/13 0030 07/25/13 0417 07/25/13 0643 07/25/13 0903  GLUCAP 84 107* 69* 77 95    Recent Results (from the past 240 hour(s))  MRSA PCR SCREENING     Status: None   Collection Time    07/21/13  9:59 PM      Result Value Range Status   MRSA by PCR NEGATIVE  NEGATIVE Final   Comment:            The GeneXpert MRSA Assay (FDA     approved for NASAL specimens     only), is one component of a     comprehensive MRSA colonization     surveillance  program. It is not     intended to diagnose MRSA     infection nor to guide or     monitor treatment for     MRSA infections.  URINE CULTURE     Status: None   Collection Time    07/22/13  4:24 PM      Result Value Range Status   Specimen Description URINE, CATHETERIZED   Final   Special Requests NONE   Final   Culture  Setup Time     Final   Value: 07/22/2013 17:18     Performed at [facility]   Colony Count     Final   Value: NO GROWTH     Performed at [facility]   Culture     Final   Value: NO GROWTH     Performed at [facility]   Report Status 07/23/2013 FINAL   Final     Studies:  Recent x-ray studies have been reviewed in detail by the Attending Physician  ELLIS,ALLISON L., ANP [phone] 07/25/2013, 12:23 PM  I have personally examined this patient and reviewed the entire database. I have reviewed the above note, made any necessary editorial changes, and agree with its content.  Lonia Blood, MD [facility]

## 2013-07-25 NOTE — Progress Notes (Signed)
INITIAL NUTRITION ASSESSMENT  DOCUMENTATION CODES Per approved criteria  -Obesity Unspecified   INTERVENTION:  TPN dosing per Pharmacy to meet >90% of estimated nutrition needs while bowel rest is indicated.  NUTRITION DIAGNOSIS: Inadequate oral intake related to inability to eat with altered GI function as evidenced by NPO status.   Goal: Intake to meet >90% of estimated nutrition needs.  Monitor:  TPN tolerance/adequacy, ability to transition to PO diet, labs, weight trend.  Reason for Assessment: New TPN Consult  72 y.o. male  Admitting Dx: SBO (small bowel obstruction)  ASSESSMENT: Patient initially presented at his PCP's office with a 1 week h/o intermittent right groin pain (has known small RIH in past), as well as 2-3 days onset hoarseness, was dyspneic with some difficulty speaking complete sentences. Admitted on 12/18 with SBO secondary to incarcerated right inguinal hernia.  Patient is at nutrition risk given SBO and inability to advance diet with recent >/= 5 day NPO status. Plans to start TPN today via PICC. Nutrition focused physical exam completed.  No muscle or subcutaneous fat depletion noticed.  Patient to begin TPN with Clinimix E 5/15 @ 40 ml/hr and lipids @ 5 ml/hr to provide 1080 ml, 922 kcal, and 48 grams protein per day. Will meet 43% minimum estimated energy needs and 40% minimum estimated protein needs.   Height: Ht Readings from Last 1 Encounters:  07/21/13 5\' 6"  (1.676 m)    Weight: Wt Readings from Last 1 Encounters:  07/21/13 227 lb 8.2 oz (103.2 kg)    Ideal Body Weight: 64.5 kg  % Ideal Body Weight: 160%  Wt Readings from Last 10 Encounters:  07/21/13 227 lb 8.2 oz (103.2 kg)  07/21/13 229 lb (103.874 kg)  04/27/13 231 lb (104.781 kg)  01/17/13 229 lb 3.2 oz (103.964 kg)  08/12/12 231 lb 6 oz (104.951 kg)  04/21/12 231 lb 2 oz (104.838 kg)  04/16/11 231 lb 2 oz (104.838 kg)  10/03/10 234 lb 4 oz (106.255 kg)  03/28/10 229 lb 8 oz  (104.101 kg)  12/20/09 236 lb 4 oz (107.162 kg)    Usual Body Weight: 231 lb  % Usual Body Weight: 98%  BMI:  Body mass index is 36.74 kg/(m^2).  Estimated Nutritional Needs: Kcal: 2150-2350 Protein: 120-135 gm Fluid: 2.1-2.4 L  Skin: no wounds  Diet Order: NPO  EDUCATION NEEDS: -Education not appropriate at this time   Intake/Output Summary (Last 24 hours) at 07/25/13 1535 Last data filed at 07/25/13 1303  Gross per 24 hour  Intake   3725 ml  Output   4125 ml  Net   -400 ml    Last BM: 12/16   Labs:   Recent Labs Lab 07/23/13 0410 07/24/13 0600 07/25/13 0420  NA 141 145 147*  K 4.0 3.7 3.9  CL 102 109 110  CO2 28 27 26   BUN 73* 34* 23  CREATININE 3.51* 1.61* 1.27  CALCIUM 8.6 9.0 9.2  GLUCOSE 107* 104* 97    CBG (last 3)   Recent Labs  07/25/13 0643 07/25/13 0903 07/25/13 1311  GLUCAP 77 95 90    Scheduled Meds: . enoxaparin (LOVENOX) injection  40 mg Subcutaneous Q24H  . insulin aspart  0-9 Units Subcutaneous Q6H  . metoprolol  2.5 mg Intravenous Q6H  . pantoprazole (PROTONIX) IV  40 mg Intravenous Q12H  . piperacillin-tazobactam (ZOSYN)  IV  3.375 g Intravenous Q8H  . regadenoson      . sodium chloride  250 mL Intravenous Once  .  sodium chloride  3 mL Intravenous Q12H    Continuous Infusions: . sodium chloride 125 mL/hr at 07/25/13 0824  . sodium chloride    . Marland KitchenTPN (CLINIMIX-E) Adult     And  . fat emulsion      Past Medical History  Diagnosis Date  . Hyperlipidemia   . Hypertension   . Hepatitis   . Obesity, morbid   . Benign prostatic hypertrophy   . Cataracts, bilateral   . Carpal tunnel syndrome, bilateral   . Diverticulosis of colon   . Nephrolithiasis   . Low back pain   . ALLERGIC RHINITIS   . Head mass   . Diabetes mellitus type II   . Coronary artery disease     Dr. Tresa Endo; 2D ECHO, 12/11/2011 - EF 50-55%, normal; NUCLEAR STRESS TEST, 10/16/2010 - no evidemce of inducible ischemia  . Occlusion and stenosis of  carotid artery without mention of cerebral infarction     CAROTID DOPPLER, 03/18/2012 - srable, mild, hard, plaque noted, bilaterally  . 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 Last nuc 2012 negative. 07/21/2013  . Inguinal hernia, Rt reduced, Lt present 07/21/2013  . Stroke   . GERD (gastroesophageal reflux disease)   . H/O hiatal hernia     Past Surgical History  Procedure Laterality Date  . Appendectomy    . Coronary artery bypass graft    . Cardiac catheterization    . Cataract extraction Right     Joaquin Courts, RD, LDN, CNSC Pager [phone] After Hours Pager [phone]

## 2013-07-25 NOTE — Progress Notes (Addendum)
Subjective: Awake and alert. Says he passed a small amount of flatus twice but no stool. Denies pain or nausea.  NG output is bilious, high volume, but eating a lot of ice chips yesterday. These have apparently been discontinued.  Appreciate followup cardiology evaluation. Lexiscan Myoview this morning to assist with risk stratification for possible hernia and abdominal surgery.  Objective: Vital signs in last 24 hours: Temp:  [97.5 F (36.4 C)-98.7 F (37.1 C)] 98.2 F (36.8 C) (12/22 0800) Pulse Rate:  [25-95] 91 (12/22 0800) Resp:  [14-23] 23 (12/22 0800) BP: (131-157)/(64-80) 153/80 mmHg (12/22 0800) SpO2:  [95 %-100 %] 95 % (12/22 0800) Last BM Date: 07/19/13  Intake/Output from previous day: 12/21 0701 - 12/22 0700 In: 3150 [I.V.:3000; IV Piggyback:150] Out: 5125 [Urine:1725; Emesis/NG output:3400] Intake/Output this shift:    General appearance: alert. Cooperative. Oriented. Mental status normal. In no distress. GI: abdomen soft. Somewhat obese. Hypoactive bowel sounds. No obstructive sounds. Right inguinal hernia appears reduced and nontender  Lab Results:   Recent Labs  07/23/13 0410  WBC 7.8  HGB 13.6  HCT 39.1  PLT 185   BMET  Recent Labs  07/24/13 0600 07/25/13 0420  NA 145 147*  K 3.7 3.9  CL 109 110  CO2 27 26  GLUCOSE 104* 97  BUN 34* 23  CREATININE 1.61* 1.27  CALCIUM 9.0 9.2   PT/INR No results found for this basename: LABPROT, INR,  in the last 72 hours ABG No results found for this basename: PHART, PCO2, PO2, HCO3,  in the last 72 hours  Studies/Results: Dg Abd Portable 1v  07/24/2013   CLINICAL DATA:  Diabetes.  Inguinal hernia.  EXAM: PORTABLE ABDOMEN - 1 VIEW  COMPARISON:  Abdomen 07/23/2013.  CT 07/21/2013.  FINDINGS: Upper most portion of the abdomen was not imaged. NG tube noted ,tip projected over the distal stomach. There is persistent small bowel distention. Colonic gas pattern is nonspecific. Vascular calcification noted.  Degenerative changes lumbar spine and both hips.  IMPRESSION: Persistent small bowel distention. NG tube noted projected over distal stomach.   Electronically Signed   By: Maisie Fus  Register   On: 07/24/2013 08:38   Dg Abd Portable 1v  07/23/2013   CLINICAL DATA:  Ileus.  EXAM: PORTABLE ABDOMEN - 1 VIEW  COMPARISON:  Abdominal series of July 22, 2013.  FINDINGS: There remain loops of mildly distended gas-filled small bowel stacked in the mid abdomen. The nasogastric tube tip in proximal port lies in the region of the distal gastric body and/or antral region. There is no significant colonic distention. A small amount of gas in the right colon and in the rectum is noted.  IMPRESSION: There is a fairly stable appearance of the small bowel ileus versus distal obstruction.   Electronically Signed   By: David  [place]   On: 07/23/2013 10:16    Anti-infectives: Anti-infectives   Start     Dose/Rate Route Frequency Ordered Stop   07/24/13 1400  piperacillin-tazobactam (ZOSYN) IVPB 3.375 g     3.375 g 12.5 mL/hr over 240 Minutes Intravenous 3 times per day 07/24/13 1003     07/21/13 2200  piperacillin-tazobactam (ZOSYN) IVPB 2.25 g  Status:  Discontinued     2.25 g 100 mL/hr over 30 Minutes Intravenous 3 times per day 07/21/13 2120 07/24/13 1003   07/21/13 1445  piperacillin-tazobactam (ZOSYN) IVPB 3.375 g     3.375 g 100 mL/hr over 30 Minutes Intravenous  Once 07/21/13 1435 07/21/13 1520  Assessment/Plan:  Incarcerated right inguinal hernia, with SBO, reduced on admission. No evidence of compromised bowel at this time.  Ileus versus partial SBO. Most likely this will resolve, but there is a possibility that there may still be adhesions or some entrapment of small bowel that may necessitate surgery this admission.  This would include right inguinal hernia repair, possible laparoscopy, possible laparotomy.  CAD, s/p CABG Elevated troponin Aspiration Pneumonia on admission, on antibiotics per  [facility] MVP DM ARF-better  Proceed with cardiac risk stratification today.  Lab work and Art therapist, including pre-albumin.  We will need to address nutritional status and nutritional support within the next 2 days or so.   LOS: 4 days    Avian Konigsberg M. Derrell Lolling, M.D., [facility] General and Minimally invasive Surgery Breast and Colorectal Surgery Office:   [phone]  07/25/2013

## 2013-07-25 NOTE — Progress Notes (Signed)
Physician notified: Trauma PA At: 1435  Need to page Will.   Paged: Will with general surg by way of office staff.  At: 1443  Regarding: Dr. Derrell Lolling stated Dr. Lindie Spruce on call for potential abd surgery this week for patient. Daughter and patient have questions pertaining plan. Explained to daughter repeat abd xr in AM and labs; daughter would like to speak to Careers adviser.  Awaiting return response.   Returned Response at: 1445  Order(s): Awaiting cardiolite results. Will come by later today.

## 2013-07-25 NOTE — Progress Notes (Signed)
Peripherally Inserted Central Catheter/Midline Placement  The IV Nurse has discussed with the patient and/or persons authorized to consent for the patient, the purpose of this procedure and the potential benefits and risks involved with this procedure.  The benefits include less needle sticks, lab draws from the catheter and patient may be discharged home with the catheter.  Risks include, but not limited to, infection, bleeding, blood clot (thrombus formation), and puncture of an artery; nerve damage and irregular heat beat.  Alternatives to this procedure were also discussed.  PICC/Midline Placement Documentation        Lisabeth Devoid 07/25/2013, 5:49 PM Consent obtained by Lazarus Gowda, RN

## 2013-07-25 NOTE — Clinical Documentation Improvement (Signed)
Possible Clinical Conditions?  Severe Malnutrition    Severe Protein Calorie Malnutrition  Other Condition  Cannot clinically determine   Risk Factors: Acute protein calorie malnutrition related to SBO noted per 12/19 progress notes.   Thank You, Marciano Sequin, Clinical Documentation Specialist:  [phone]  [facility]- Health Information Management

## 2013-07-25 NOTE — Progress Notes (Signed)
PARENTERAL NUTRITION CONSULT NOTE - INITIAL  Pharmacy Consult:  TPN Indication:  Bowel obstruction  No Known Allergies  Patient Measurements: Height: 5\' 6"  (167.6 cm) Weight: 227 lb 8.2 oz (103.2 kg) IBW/kg (Calculated) : 63.8 Adjusted BW = 76 kg  Vital Signs: Temp: 97.5 F (36.4 C) (12/22 1303) Temp src: Oral (12/22 1303) BP: 153/68 mmHg (12/22 1303) Pulse Rate: 102 (12/22 1303) Intake/Output from previous day: 12/21 0701 - 12/22 0700 In: 3150 [I.V.:3000; IV Piggyback:150] Out: 5125 [Urine:1725; Emesis/NG output:3400] Intake/Output from this shift: Total I/O In: 575 [I.V.:575] Out: 625 [Urine:250; Emesis/NG output:375]  Labs:  Recent Labs  07/23/13 0410  WBC 7.8  HGB 13.6  HCT 39.1  PLT 185     Recent Labs  07/23/13 0410 07/24/13 0600 07/25/13 0420  NA 141 145 147*  K 4.0 3.7 3.9  CL 102 109 110  CO2 28 27 26   GLUCOSE 107* 104* 97  BUN 73* 34* 23  CREATININE 3.51* 1.61* 1.27  CALCIUM 8.6 9.0 9.2  PROT 6.1  --   --   ALBUMIN 3.0*  --   --   AST 29  --   --   ALT 18  --   --   ALKPHOS 47  --   --   BILITOT 1.1  --   --    Estimated Creatinine Clearance: 57.5 ml/min (by C-G formula based on Cr of 1.27).    Recent Labs  07/25/13 0643 07/25/13 0903 07/25/13 1311  GLUCAP 77 95 90    Medical History: Past Medical History  Diagnosis Date  . Hyperlipidemia   . Hypertension   . Hepatitis   . Obesity, morbid   . Benign prostatic hypertrophy   . Cataracts, bilateral   . Carpal tunnel syndrome, bilateral   . Diverticulosis of colon   . Nephrolithiasis   . Low back pain   . ALLERGIC RHINITIS   . Head mass   . Diabetes mellitus type II   . Coronary artery disease     Dr. Tresa Endo; 2D ECHO, 12/11/2011 - EF 50-55%, normal; NUCLEAR STRESS TEST, 10/16/2010 - no evidemce of inducible ischemia  . Occlusion and stenosis of carotid artery without mention of cerebral infarction     CAROTID DOPPLER, 03/18/2012 - srable, mild, hard, plaque noted, bilaterally   . 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 Last nuc 2012 negative. 07/21/2013  . Inguinal hernia, Rt reduced, Lt present 07/21/2013  . Stroke   . GERD (gastroesophageal reflux disease)   . H/O hiatal hernia       Insulin Requirements in the past 24 hours:  None  Assessment: 34 YOM admitted with a one week history of intermittent right groin pain.  Abd CT showed right inguinal hernia with herniation of the distal small bowel loop with severe obstruction.  Patient's condition improved initially but has now declined and surgery may be indicated.  Pharmacy consulted to manage TPN for SBO.  GI: hx diverticulosis.  SBO, NGT re-inserted and O/P is increasing, on PPI IV Endo: DM2 with A1c 6.4%, CBGs low to low normal Lytes: hypernatremia which Clinimix will help, others WNL Renal: BPH - SCr down 1.27, CrCL 58 ml/min, good UOP, NS at 125 ml/hr Pulm: stable on RA Cards: HLD / HTN / CAD - BP/HR mildly elevated - IV Lopressor Hepatobil: LFTs/tbili WNL Neuro: A&O ID: Zosyn D#5 as empiric therapy, afebrile, WBC WNL    Best Practices: Lovenox TPN Access: PICC to be placed today TPN  day#: 0 (12/22 >> )  Current Nutrition:  NPO  Nutritional Goals:  1850-2000 kCal, 95-105 grams of protein per day   Plan:  - Initiate Clinimix E 5/15 at 40 ml/hr + IVFE 20% at 5 ml/hr - Daily multivitamin and trace elements - Change SSI to Q6H - Decrease IVF to 85 ml/hr when TPN starts at 1800 - Standard TPN labs    Wilver Tignor D. Laney Potash, PharmD, BCPS Pager:  [phone] 07/25/2013, 2:36 PM

## 2013-07-26 ENCOUNTER — Encounter (HOSPITAL_COMMUNITY)
Admission: EM | Disposition: A | Discharge status: Home / Self Care | Payer: Self-pay | Source: Home / Self Care | Attending: Internal Medicine

## 2013-07-26 ENCOUNTER — Encounter (HOSPITAL_COMMUNITY): Payer: Self-pay | Admitting: Critical Care Medicine

## 2013-07-26 ENCOUNTER — Encounter (HOSPITAL_COMMUNITY): Payer: Medicare Other | Admitting: Critical Care Medicine

## 2013-07-26 ENCOUNTER — Inpatient Hospital Stay (HOSPITAL_COMMUNITY): Payer: Medicare Other | Admitting: Critical Care Medicine

## 2013-07-26 ENCOUNTER — Inpatient Hospital Stay (HOSPITAL_COMMUNITY): Payer: Medicare Other

## 2013-07-26 DIAGNOSIS — R0609 Other forms of dyspnea: Secondary | ICD-10-CM

## 2013-07-26 HISTORY — PX: INGUINAL HERNIA REPAIR: SHX194

## 2013-07-26 LAB — COMPREHENSIVE METABOLIC PANEL
Albumin: 2.8 g/dL — ABNORMAL LOW (ref 3.5–5.2)
BUN: 17 mg/dL (ref 6–23)
Calcium: 9.2 mg/dL (ref 8.4–10.5)
Creatinine, Ser: 1.19 mg/dL (ref 0.50–1.35)
GFR calc non Af Amer: 58 mL/min — ABNORMAL LOW (ref >90)
Glucose, Bld: 157 mg/dL — ABNORMAL HIGH (ref 70–99)
Total Protein: 6.2 g/dL (ref 6.0–8.3)

## 2013-07-26 LAB — GLUCOSE, CAPILLARY
Glucose-Capillary: 101 mg/dL — ABNORMAL HIGH (ref 70–99)
Glucose-Capillary: 130 mg/dL — ABNORMAL HIGH (ref 70–99)
Glucose-Capillary: 118 mg/dL — ABNORMAL HIGH (ref 70–99)
Glucose-Capillary: 130 mg/dL — ABNORMAL HIGH (ref 70–99)
Glucose-Capillary: 129 mg/dL — ABNORMAL HIGH (ref 70–99)
Glucose-Capillary: 115 mg/dL — ABNORMAL HIGH (ref 70–99)

## 2013-07-26 LAB — DIFFERENTIAL
Eosinophils Absolute: 0 10*3/uL (ref 0.0–0.7)
Eosinophils Relative: 1 % (ref 0–5)
Lymphocytes Relative: 21 % (ref 12–46)
Lymphs Abs: 1.4 10*3/uL (ref 0.7–4.0)
Monocytes Absolute: 1 10*3/uL (ref 0.1–1.0)
Monocytes Relative: 16 % — ABNORMAL HIGH (ref 3–12)
Neutrophils Relative %: 63 % (ref 43–77)

## 2013-07-26 LAB — CBC
HCT: 38.4 % — ABNORMAL LOW (ref 39.0–52.0)
Hemoglobin: 13 g/dL (ref 13.0–17.0)
MCHC: 33.9 g/dL (ref 30.0–36.0)
MCV: 97.7 fL (ref 78.0–100.0)
RDW: 13.1 % (ref 11.5–15.5)

## 2013-07-26 LAB — PHOSPHORUS: Phosphorus: 2.6 mg/dL (ref 2.3–4.6)

## 2013-07-26 SURGERY — REPAIR, HERNIA, INGUINAL, ADULT
Anesthesia: General | Site: Abdomen | Laterality: Right

## 2013-07-26 MED ORDER — BUPIVACAINE-EPINEPHRINE 0.25% -1:200000 IJ SOLN
INTRAMUSCULAR | Status: DC | PRN
  Administered 2013-07-26: 30 mL

## 2013-07-26 MED ORDER — NEOSTIGMINE METHYLSULFATE 1 MG/ML IJ SOLN
INTRAMUSCULAR | Status: DC | PRN
  Administered 2013-07-26: 1 mg via INTRAVENOUS
  Administered 2013-07-26: 4 mg via INTRAVENOUS

## 2013-07-26 MED ORDER — WHITE PETROLATUM GEL
Status: AC
  Administered 2013-07-26: 0.2
  Filled 2013-07-26: qty 5

## 2013-07-26 MED ORDER — FENTANYL CITRATE 0.05 MG/ML IJ SOLN
INTRAMUSCULAR | Status: DC | PRN
  Administered 2013-07-26 (×4): 50 ug via INTRAVENOUS
  Administered 2013-07-26: 100 ug via INTRAVENOUS

## 2013-07-26 MED ORDER — SODIUM CHLORIDE 0.9 % IV SOLN
INTRAVENOUS | Status: AC
  Administered 2013-07-26 – 2013-07-27 (×2): via INTRAVENOUS

## 2013-07-26 MED ORDER — LIDOCAINE HCL (CARDIAC) 20 MG/ML IV SOLN
INTRAVENOUS | Status: DC | PRN
  Administered 2013-07-26: 100 mg via INTRAVENOUS

## 2013-07-26 MED ORDER — BUPIVACAINE HCL (PF) 0.25 % IJ SOLN
INTRAMUSCULAR | Status: AC
  Filled 2013-07-26: qty 30

## 2013-07-26 MED ORDER — FAT EMULSION 20 % IV EMUL
250.0000 mL | INTRAVENOUS | Status: AC
  Administered 2013-07-26: 250 mL via INTRAVENOUS
  Filled 2013-07-26: qty 250

## 2013-07-26 MED ORDER — BUPIVACAINE-EPINEPHRINE (PF) 0.25% -1:200000 IJ SOLN
INTRAMUSCULAR | Status: AC
  Filled 2013-07-26: qty 30

## 2013-07-26 MED ORDER — SUCCINYLCHOLINE CHLORIDE 20 MG/ML IJ SOLN
INTRAMUSCULAR | Status: DC | PRN
  Administered 2013-07-26: 120 mg via INTRAVENOUS

## 2013-07-26 MED ORDER — ALBUMIN HUMAN 5 % IV SOLN
INTRAVENOUS | Status: DC | PRN
  Administered 2013-07-26 (×2): via INTRAVENOUS

## 2013-07-26 MED ORDER — SODIUM CHLORIDE 0.9 % IR SOLN
Status: DC | PRN
  Administered 2013-07-26: 1000 mL

## 2013-07-26 MED ORDER — GLYCOPYRROLATE 0.2 MG/ML IJ SOLN
INTRAMUSCULAR | Status: DC | PRN
  Administered 2013-07-26: 0.6 mg via INTRAVENOUS
  Administered 2013-07-26: 0.2 mg via INTRAVENOUS

## 2013-07-26 MED ORDER — LACTATED RINGERS IV SOLN
INTRAVENOUS | Status: DC | PRN
  Administered 2013-07-26: 10:00:00 via INTRAVENOUS

## 2013-07-26 MED ORDER — EPHEDRINE SULFATE 50 MG/ML IJ SOLN
INTRAMUSCULAR | Status: DC | PRN
  Administered 2013-07-26: 15 mg via INTRAVENOUS

## 2013-07-26 MED ORDER — CEFAZOLIN SODIUM-DEXTROSE 2-3 GM-% IV SOLR
INTRAVENOUS | Status: AC
  Filled 2013-07-26: qty 50

## 2013-07-26 MED ORDER — SODIUM CHLORIDE 0.9 % IJ SOLN
INTRAMUSCULAR | Status: AC
  Filled 2013-07-26: qty 10

## 2013-07-26 MED ORDER — PHENYLEPHRINE HCL 10 MG/ML IJ SOLN
INTRAMUSCULAR | Status: DC | PRN
  Administered 2013-07-26 (×2): 160 ug via INTRAVENOUS
  Administered 2013-07-26: 240 ug via INTRAVENOUS

## 2013-07-26 MED ORDER — ONDANSETRON HCL 4 MG/2ML IJ SOLN
INTRAMUSCULAR | Status: DC | PRN
  Administered 2013-07-26: 4 mg via INTRAVENOUS

## 2013-07-26 MED ORDER — SODIUM CHLORIDE 0.9 % IR SOLN
Status: DC | PRN
  Administered 2013-07-26: 11:00:00

## 2013-07-26 MED ORDER — PHENYLEPHRINE HCL 10 MG/ML IJ SOLN
10.0000 mg | INTRAVENOUS | Status: DC | PRN
  Administered 2013-07-26: 50 ug/min via INTRAVENOUS

## 2013-07-26 MED ORDER — ROCURONIUM BROMIDE 100 MG/10ML IV SOLN
INTRAVENOUS | Status: DC | PRN
  Administered 2013-07-26: 10 mg via INTRAVENOUS
  Administered 2013-07-26: 20 mg via INTRAVENOUS

## 2013-07-26 MED ORDER — PROPOFOL 10 MG/ML IV BOLUS
INTRAVENOUS | Status: DC | PRN
  Administered 2013-07-26: 140 mg via INTRAVENOUS

## 2013-07-26 MED ORDER — LIDOCAINE HCL (PF) 1 % IJ SOLN
INTRAMUSCULAR | Status: AC
  Filled 2013-07-26: qty 30

## 2013-07-26 MED ORDER — TRACE MINERALS CR-CU-F-FE-I-MN-MO-SE-ZN IV SOLN
INTRAVENOUS | Status: AC
  Administered 2013-07-26: 18:00:00 via INTRAVENOUS
  Administered 2013-07-26: 40 mL/h via INTRAVENOUS
  Filled 2013-07-26: qty 2000

## 2013-07-26 MED ORDER — METOPROLOL TARTRATE 1 MG/ML IV SOLN
5.0000 mg | Freq: Four times a day (QID) | INTRAVENOUS | Status: DC
  Administered 2013-07-26 – 2013-08-01 (×23): 5 mg via INTRAVENOUS
  Filled 2013-07-26 (×37): qty 5

## 2013-07-26 SURGICAL SUPPLY — 66 items
ADH SKN CLS APL DERMABOND .7 (GAUZE/BANDAGES/DRESSINGS) ×2
APPLIER CLIP 5 13 M/L LIGAMAX5 (MISCELLANEOUS)
APR CLP MED LRG 5 ANG JAW (MISCELLANEOUS)
BAG DECANTER FOR FLEXI CONT (MISCELLANEOUS) ×3 IMPLANT
BLADE SURG 10 STRL SS (BLADE) ×3 IMPLANT
BLADE SURG 15 STRL LF DISP TIS (BLADE) ×1 IMPLANT
BLADE SURG 15 STRL SS (BLADE)
BLADE SURG ROTATE 9660 (MISCELLANEOUS) ×2 IMPLANT
CANISTER SUCTION 2500CC (MISCELLANEOUS) ×3 IMPLANT
CHLORAPREP W/TINT 26ML (MISCELLANEOUS) ×4 IMPLANT
CLEANER TIP ELECTROSURG 2X2 (MISCELLANEOUS) ×3 IMPLANT
CLIP APPLIE 5 13 M/L LIGAMAX5 (MISCELLANEOUS) IMPLANT
COVER SURGICAL LIGHT HANDLE (MISCELLANEOUS) ×3 IMPLANT
DECANTER SPIKE VIAL GLASS SM (MISCELLANEOUS) ×4 IMPLANT
DERMABOND ADVANCED (GAUZE/BANDAGES/DRESSINGS) ×1
DERMABOND ADVANCED .7 DNX12 (GAUZE/BANDAGES/DRESSINGS) ×2 IMPLANT
DRAIN PENROSE 1/2X12 LTX STRL (WOUND CARE) ×2 IMPLANT
DRAPE LAPAROTOMY TRNSV 102X78 (DRAPE) ×1 IMPLANT
DRAPE UTILITY 15X26 W/TAPE STR (DRAPE) ×6 IMPLANT
DRSG TEGADERM 4X4.75 (GAUZE/BANDAGES/DRESSINGS) ×5 IMPLANT
ELECT REM PT RETURN 9FT ADLT (ELECTROSURGICAL) ×3
ELECTRODE REM PT RTRN 9FT ADLT (ELECTROSURGICAL) ×2 IMPLANT
GAUZE SPONGE 4X4 16PLY XRAY LF (GAUZE/BANDAGES/DRESSINGS) ×1 IMPLANT
GLOVE BIO SURGEON STRL SZ7 (GLOVE) ×2 IMPLANT
GLOVE BIO SURGEON STRL SZ7.5 (GLOVE) ×4 IMPLANT
GLOVE BIOGEL PI IND STRL 7.0 (GLOVE) ×1 IMPLANT
GLOVE BIOGEL PI IND STRL 8 (GLOVE) ×2 IMPLANT
GLOVE BIOGEL PI INDICATOR 7.0 (GLOVE) ×1
GLOVE BIOGEL PI INDICATOR 8 (GLOVE) ×1
GLOVE ECLIPSE 7.5 STRL STRAW (GLOVE) ×3 IMPLANT
GOWN STRL NON-REIN LRG LVL3 (GOWN DISPOSABLE) ×6 IMPLANT
GOWN STRL REIN XL XLG (GOWN DISPOSABLE) ×2 IMPLANT
KIT BASIN OR (CUSTOM PROCEDURE TRAY) ×3 IMPLANT
KIT ROOM TURNOVER OR (KITS) ×3 IMPLANT
MESH PROLENE 3X6 (Mesh General) ×2 IMPLANT
NDL HYPO 25GX1X1/2 BEV (NEEDLE) ×1 IMPLANT
NEEDLE HYPO 25GX1X1/2 BEV (NEEDLE) IMPLANT
NS IRRIG 1000ML POUR BTL (IV SOLUTION) ×3 IMPLANT
PACK SURGICAL SETUP 50X90 (CUSTOM PROCEDURE TRAY) ×1 IMPLANT
PAD ARMBOARD 7.5X6 YLW CONV (MISCELLANEOUS) ×4 IMPLANT
PENCIL BUTTON HOLSTER BLD 10FT (ELECTRODE) ×3 IMPLANT
SCISSORS LAP 5X35 DISP (ENDOMECHANICALS) IMPLANT
SET IRRIG TUBING LAPAROSCOPIC (IRRIGATION / IRRIGATOR) IMPLANT
SLEEVE ENDOPATH XCEL 5M (ENDOMECHANICALS) ×1 IMPLANT
SPECIMEN JAR SMALL (MISCELLANEOUS) IMPLANT
SPONGE INTESTINAL PEANUT (DISPOSABLE) ×3 IMPLANT
SPONGE LAP 18X18 X RAY DECT (DISPOSABLE) ×1 IMPLANT
STRIP CLOSURE SKIN 1/2X4 (GAUZE/BANDAGES/DRESSINGS) ×3 IMPLANT
SUT ETHIBOND 0 MO6 C/R (SUTURE) ×3 IMPLANT
SUT MNCRL AB 4-0 PS2 18 (SUTURE) ×3 IMPLANT
SUT MON AB 4-0 PC3 18 (SUTURE) ×3 IMPLANT
SUT PROLENE 0 CT 2 (SUTURE) ×6 IMPLANT
SUT VIC AB 3-0 SH 27 (SUTURE) ×6
SUT VIC AB 3-0 SH 27X BRD (SUTURE) ×4 IMPLANT
SUT VICRYL AB 3 0 TIES (SUTURE) ×3 IMPLANT
SYR BULB 3OZ (MISCELLANEOUS) ×3 IMPLANT
SYR CONTROL 10ML LL (SYRINGE) ×3 IMPLANT
TOWEL OR 17X24 6PK STRL BLUE (TOWEL DISPOSABLE) ×3 IMPLANT
TOWEL OR 17X26 10 PK STRL BLUE (TOWEL DISPOSABLE) ×3 IMPLANT
TRAY LAPAROSCOPIC (CUSTOM PROCEDURE TRAY) ×3 IMPLANT
TROCAR XCEL BLUNT TIP 100MML (ENDOMECHANICALS) IMPLANT
TROCAR XCEL NON-BLD 11X100MML (ENDOMECHANICALS) ×1 IMPLANT
TROCAR XCEL NON-BLD 5MMX100MML (ENDOMECHANICALS) ×3 IMPLANT
TUBE CONNECTING 12X1/4 (SUCTIONS) ×2 IMPLANT
WATER STERILE IRR 1000ML POUR (IV SOLUTION) IMPLANT
YANKAUER SUCT BULB TIP NO VENT (SUCTIONS) ×2 IMPLANT

## 2013-07-26 NOTE — Anesthesia Preprocedure Evaluation (Addendum)
Anesthesia Evaluation  Patient identified by MRN, date of birth, ID band Patient awake    Reviewed: Allergy & Precautions, H&P , NPO status , Patient's Chart, lab work & pertinent test results, reviewed documented beta blocker date and time   Airway Mallampati: III TM Distance: >3 FB Neck ROM: Full    Dental  (+) Dental Advisory Given, Partial Upper and Partial Lower   Pulmonary shortness of breath, former smoker,          Cardiovascular hypertension, Pt. on home beta blockers and Pt. on medications + CAD, + CABG and + Peripheral Vascular Disease     Neuro/Psych CVA, No Residual Symptoms    GI/Hepatic hiatal hernia, GERD-  ,(+) Hepatitis -  Endo/Other  diabetes  Renal/GU Hx ARF this admission - now improved     Musculoskeletal   Abdominal   Peds  Hematology   Anesthesia Other Findings   Reproductive/Obstetrics                        Anesthesia Physical Anesthesia Plan  ASA: III  Anesthesia Plan: General   Post-op Pain Management:    Induction: Intravenous  Airway Management Planned: Oral ETT  Additional Equipment:   Intra-op Plan:   Post-operative Plan: Extubation in OR  Informed Consent: I have reviewed the patients History and Physical, chart, labs and discussed the procedure including the risks, benefits and alternatives for the proposed anesthesia with the patient or authorized representative who has indicated his/her understanding and acceptance.   Dental advisory given  Plan Discussed with: Anesthesiologist and Surgeon  Anesthesia Plan Comments:         Anesthesia Quick Evaluation

## 2013-07-26 NOTE — Op Note (Addendum)
OPERATIVE REPORT  DATE OF OPERATION:  07/26/2013  PATIENT:  Andres [name]  72 y.o. male  PRE-OPERATIVE DIAGNOSIS:  right inguinal hernia  POST-OPERATIVE DIAGNOSIS:  right inguinal hernia. Incarcerated with small bowel  PROCEDURE:  Procedure(s): HERNIA REPAIR INGUINAL ADULT  SURGEON:  Surgeon(s): Cherylynn Ridges, MD  ASSISTANT: None  ANESTHESIA:   general  EBL: <30 ml  BLOOD ADMINISTERED: none  DRAINS: Nasogastric Tube and Urinary Catheter (Foley)   SPECIMEN:  No Specimen  COUNTS CORRECT:  YES  PROCEDURE DETAILS: The patient was taken to the operating room and placed on the table in the supine position. After an adequate general endotracheal anesthetic was administered he was prepped and draped in usual sterile manner exposing his right groin and also his entire abdomen.  There is a possibility that a diagnostic laparoscopy and possible exploratory laparotomy performed to be performed. After proper time out was performed identifying the patient and the procedure to be performed, we started off with the right lower quadrant transverse 11-12 cm incision using a #10 blade. We took it down to subcutaneous tissue then down to the fascia of the external oblique. To coming out through the external renal was an incarcerated loop of small bowel contained within the hernia sac.  We dissected out the spermatic cord and the incarcerated hernia using a Penrose drain to mobilize it. We opened the sac and were able to free up the small bowel that was incarcerated in the sac. This was done without damaging the bowel which appeared to be completely healthy and without stricturing. We allowed the small bowel to reduce back into the peritoneal cavity and we suture ligated the hernia sac at its base using a Kelly clamp and 2 suture ligatures of 0 Ethibond suture.  The indirect sac retracted significantly. We imbricated the direct portion of the sac on itself using interrupted 0 Ethibond sutures. We  then repaired the floor using an oval piece of polypropylene mesh measuring approximately 6 x 4 cm in size. It was attached to the conjoin tendon and the reflected portion of the inguinal ligament using running 0 Prolene sutures. Antibiotic solution was used to irrigate canal. The spermatic cord was allowed to fall back into the canal and then we reapproximated the external oblique fascia on top of using running 3-0 Vicryl suture.  Scarpa's fascia was reapproximated using interrupted 3-0 Vicryl suture and the skin was then closed using running subcuticular stitch of 4-0 Monocryl. 0.25% Marcaine with epi was injected into the wound a total of 20 cc used.All needle counts, sponge counts, and instrument counts were correct. A sterile dressing was applied including Dermabond Steri-Strips and Tegaderm.  PATIENT DISPOSITION:  PACU - hemodynamically stable.   Cherylynn Ridges 12/23/201411:36 AM

## 2013-07-26 NOTE — Anesthesia Postprocedure Evaluation (Signed)
Anesthesia Post Note  Patient: Andres [name]  Procedure(s) Performed: Procedure(s) (LRB): REPAIR  INCARCERATED RIGHT INGUINAL  HERNIA  (Right)  Anesthesia type: general  Patient location: PACU  Post pain: Pain level controlled  Post assessment: Patient's Cardiovascular Status Stable  Last Vitals:  Filed Vitals:   07/26/13 1304  BP: 142/78  Pulse: 91  Temp: 36.4 C  Resp: 18    Post vital signs: Reviewed and stable  Level of consciousness: sedated  Complications: No apparent anesthesia complications

## 2013-07-26 NOTE — Preoperative (Signed)
Beta Blockers   Reason not to administer Beta Blockers:Not Applicable, pt had lopressor @ [phone]

## 2013-07-26 NOTE — Anesthesia Procedure Notes (Signed)
Procedure Name: Intubation Date/Time: 07/26/2013 10:19 AM Performed by: Elon Alas Pre-anesthesia Checklist: Patient identified, Timeout performed, Emergency Drugs available, Suction available and Patient being monitored Patient Re-evaluated:Patient Re-evaluated prior to inductionOxygen Delivery Method: Circle system utilized Preoxygenation: Pre-oxygenation with 100% oxygen Intubation Type: IV induction, Cricoid Pressure applied and Rapid sequence Laryngoscope Size: Mac and 4 Grade View: Grade II Tube type: Oral Tube size: 7.5 mm Number of attempts: 1 Airway Equipment and Method: Stylet Placement Confirmation: positive ETCO2,  ETT inserted through vocal cords under direct vision and breath sounds checked- equal and bilateral Secured at: 23 cm Tube secured with: Tape Dental Injury: Teeth and Oropharynx as per pre-operative assessment

## 2013-07-26 NOTE — Progress Notes (Signed)
For the OR today.  Jilian West O. Dionisio Aragones, III, MD, FACS [phone]--pager [phone]--office [facility] 

## 2013-07-26 NOTE — Progress Notes (Signed)
[facility] Progress Note [place] TEAM 1 - [facility] ICU Team   WESS BANEY ZOX:[id number] DOB: [birth year] DOA: 07/21/2013 PCP: Oliver Barre, MD  Brief narrative: 72 y.o. male who initially presented at his PCP's office with a 1week h/o intermittent right groin pain (has known small RIH), as well as 2-3 days onset hoarseness. He was dyspneic with some difficulty speaking complete sentences, diaphoretic with clammy extremities. He was found to be hypotensive at the office and sent to the ED. The pt admitted to nausea and vomiting x 2 over the past week and his last BM was 2 days prior. Pt also reported a cough productive of phlegm. He admitted to intermittent chest pressure for the past 2 days - and stated that it felt like he needed to belch and was relieved some by tums/peptobismol.   He was seen in ED and abd CT showed right inguinal hernia with herniation of the distal small bowel loop with severe obstruction of the small bowel - also a left inguinal hernia with herniation of fat only.  In addition patchy bilateral pulmonary infiltrates consistent with pneumonitis possibly from aspiration were seen on this CT. Surgery was consulted and the hernia was reduced. Labs showed elevated trop .21, cr 3.6. Cards was consulted.  Assessment/Plan:  SBO secondary to incarcerated R inguinal Hernia  -hernia reduced per surgery in ED and pt was started on clears -unfortunately he re-developed emesis (500 cc) which prompted insertion of NGT  -Films revealed continued obstruction so underwent EL and RIH repair on 12/23 -PICC  Was placed pre op on 12/22 andTNA per pharmacy initiated  Hypotension -resolved after aggressive IV hydration -due to hypovolemia  Elevated troponin - CAD hx of CABG 1997 X 6 -Last nuc 2012 negative -Cards following-Myoview 12/22 for pre op clearance negative for ischemia -enzymes have normalized -ASA and plavix on hold since NPO/NGT  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- discussed with patient and daughter - no plans for further investigation by Cardiology for now   Aspiration PNA  -cont emipiric anbx's  -cont supportive care -currently stable on RA  Acute renal failure  -continues to improve -likely d/t volume depletion secondary to SBO -may have had a degree of ATN from pre admit HCTZ/ACE I -renal US normal -hold ACE/HCTZ   DM  -cont SSI -HgbAIC  6.8  DVT prophylaxis: Lovenox Code Status: Full Family Communication: Patient and daughter  Disposition Plan/Expected LOS: Stepdown  Consultants: Cardiology Surgery  Procedures: 2-D echocardiogram  - Left ventricle: The cavity size was normal. Wall thickness was normal. Systolic function was normal. The estimated ejection fraction was in the range of 60% to 65%. Possible hypokinesis of the basal-midinferior myocardium. Left ventricular diastolic function parameters were normal. - Ventricular septum: Septal motion showed paradox. These changes are consistent with a post-thoracotomy state. - Mitral valve: Prolapse, involving the posterior leaflet. Mild to moderate regurgitation directed eccentrically and anteriorly. Impressions: - Consider TEE to better evaluate the mitral valve.  EL with right inguinal hernia repair  12/23  Antibiotics: Zosyn 12/18>>>  HPI/Subjective: Patient alert post op with some incisional pain. No nausea.  Objective: Blood pressure 142/78, pulse 91, temperature 97.6 F (36.4 C), temperature source Oral, resp. rate 18, height 5\' 6"  (1.676 m), weight 227 lb 8.2 oz (103.2 kg), SpO2 95.00%.  Intake/Output Summary (Last 24 hours) at 07/26/13 1415 Last data filed at 07/26/13 1304  Gross per 24 hour  Intake 3067.08 ml  Output   3725  ml  Net -657.92 ml    Exam: General: No acute respiratory distress Lungs: Clear to auscultation bilaterally without wheezes or crackles, RA Cardiovascular: Regular rate and rhythm without murmur gallop  or rub normal S1 and S2, no peripheral edema or JVD Abdomen: Somewhat distended post op; midline incision covered by drsg which is dry; NG tube to suction with green bilious output; bowel sounds are absent Musculoskeletal: No significant cyanosis, clubbing of bilateral lower extremities Neurological: Alert and oriented x 3, moves all extremities x 4 without focal neurological deficits, CN 2-12 intact  Scheduled Meds:  Scheduled Meds: . ceFAZolin      . enoxaparin (LOVENOX) injection  40 mg Subcutaneous Q24H  . insulin aspart  0-9 Units Subcutaneous Q6H  . metoprolol  2.5 mg Intravenous Q6H  . pantoprazole (PROTONIX) IV  40 mg Intravenous Q12H  . piperacillin-tazobactam (ZOSYN)  IV  3.375 g Intravenous Q8H  . sodium chloride  250 mL Intravenous Once  . sodium chloride  10-40 mL Intracatheter Q12H  . sodium chloride  3 mL Intravenous Q12H    Data Reviewed: Basic Metabolic Panel:  Recent Labs Lab 07/22/13 0500 07/23/13 0410 07/24/13 0600 07/25/13 0420 07/26/13 0350  NA 134* 141 145 147* 151*  K 4.5 4.0 3.7 3.9 4.0  CL 93* 102 109 110 116*  CO2 27 28 27 26 24   GLUCOSE 152* 107* 104* 97 157*  BUN 83* 73* 34* 23 17  CREATININE 4.91* 3.51* 1.61* 1.27 1.19  CALCIUM 8.6 8.6 9.0 9.2 9.2  MG  --   --   --   --  2.3  PHOS  --   --   --   --  2.6   Liver Function Tests:  Recent Labs Lab 07/21/13 1251 07/23/13 0410 07/26/13 0350  AST 30 29 22   ALT 22 18 17   ALKPHOS 63 47 50  BILITOT 1.2 1.1 0.6  PROT 7.1 6.1 6.2  ALBUMIN 4.0 3.0* 2.8*   No results found for this basename: LIPASE, AMYLASE,  in the last 168 hours No results found for this basename: AMMONIA,  in the last 168 hours  CBC:  Recent Labs Lab 07/21/13 1251 07/21/13 1318 07/22/13 0500 07/23/13 0410 07/26/13 0350  WBC 7.3  --  6.9 7.8 6.4  NEUTROABS 5.8  --   --   --  4.0  HGB 17.1* 17.3* 15.0 13.6 13.0  HCT 46.9 51.0 42.6 39.1 38.4*  MCV 95.5  --  95.3 96.8 97.7  PLT 228  --  216 185 200   Cardiac  Enzymes:  Recent Labs Lab 07/21/13 1715 07/21/13 1811 07/21/13 2300 07/22/13 0600 07/22/13 1142  TROPONINI 0.33* 0.34* <0.30 <0.30 <0.30   CBG:  Recent Labs Lab 07/25/13 2016 07/25/13 2329 07/26/13 0323 07/26/13 0909 07/26/13 1159  GLUCAP 101* 130* 118* 130* 129*    Recent Results (from the past 240 hour(s))  MRSA PCR SCREENING     Status: None   Collection Time    07/21/13  9:59 PM      Result Value Range Status   MRSA by PCR NEGATIVE  NEGATIVE Final   Comment:            The GeneXpert MRSA Assay (FDA     approved for NASAL specimens     only), is one component of a     comprehensive MRSA colonization     surveillance program. It is not     intended to diagnose MRSA     infection  nor to guide or     monitor treatment for     MRSA infections.  URINE CULTURE     Status: None   Collection Time    07/22/13  4:24 PM      Result Value Range Status   Specimen Description URINE, CATHETERIZED   Final   Special Requests NONE   Final   Culture  Setup Time     Final   Value: 07/22/2013 17:18     Performed at [facility] Count     Final   Value: NO GROWTH     Performed at [facility]   Culture     Final   Value: NO GROWTH     Performed at [facility]   Report Status 07/23/2013 FINAL   Final     Studies:  Recent x-ray studies have been reviewed in detail by the Attending Physician  ELLIS,ALLISON L., ANP [phone] 07/26/2013, 2:15 PM   I have examined the patient, reviewed the chart and modified the above note which I agree with.   Tanecia Mccay,MD [phone] 07/26/2013, 5:36 PM

## 2013-07-26 NOTE — Progress Notes (Signed)
   Subjective:  Still NPO with NG tube. Status post annual hernia repair of incarcerated small bowel with a right inguinal hernia this morning. No untoward cardiac events  Objective:  Vital Signs in the last 24 hours: Temp:  [96.9 F (36.1 C)-98.2 F (36.8 C)] 97.6 F (36.4 C) (12/23 1304) Pulse Rate:  [84-104] 91 (12/23 1304) Resp:  [16-27] 18 (12/23 1304) BP: (142-175)/(57-98) 142/78 mmHg (12/23 1304) SpO2:  [93 %-100 %] 95 % (12/23 1304)  Intake/Output from previous day:  Intake/Output Summary (Last 24 hours) at 07/26/13 1432 Last data filed at 07/26/13 1304  Gross per 24 hour  Intake 3067.08 ml  Output   3725 ml  Net -657.92 ml    Physical Exam: General appearance: alert, cooperative and no distress Lungs: clear to auscultation bilaterally Heart: regular rate and rhythm Abd: Few bowel sounds noted   Rate: 105  Rhythm: normal sinus rhythm, PVCs  Lab Results:  Recent Labs  07/26/13 0350  WBC 6.4  HGB 13.0  PLT 200    Recent Labs  07/25/13 0420 07/26/13 0350  NA 147* 151*  K 3.9 4.0  CL 110 116*  CO2 26 24  GLUCOSE 97 157*  BUN 23 17  CREATININE 1.27 1.19   No results found for this basename: TROPONINI, CK, MB,  in the last 72 hours No results found for this basename: INR,  in the last 72 hours  Imaging: Imaging results have been reviewed  Cardiac Studies:  Assessment/Plan:   Principal Problem:   SBO (small bowel obstruction) Active Problems:   DIABETES MELLITUS, TYPE II   HYPERLIPIDEMIA   HYPERTENSION   Obesity (BMI 30-39.9)   Hypotension, unspecified   Elevated troponin I level- (pk Troponin 0.34 in setting of acute renal insufficency)   CAD (coronary artery disease), hx of CABG 1997 X 6. Last nuc 2012 negative.   Inguinal hernia, Rt reduced, Lt present   Acute renal failure   Pneumonia, aspiration   Volume depletion   PLAN: Heart rate and blood pressure continue to be somewhat elevated, will increase metoprolol dose to 5 mg IV  every 6hrs.  1 stable tolerated by mouth would switch back to oral beta blocker therapy at home dose.  Normal EF by echocardiogram and Myoview. Nonischemic Myoview.  1 stable tolerated by mouth would also reinitiate statin and aspirin therapy. Preferentially restart beta blocker followed by a RV. Can wait on niacin and omega-3 fatty acids distal discharge.  We will sign off for now, contact us upon discharge for help with arranging cardiology followup.  Marykay Lex, M.D., M.S. [facility] [address] [place], [place]  [address]  [phone] Pager # [phone] 07/26/2013 2:36 PM

## 2013-07-26 NOTE — Progress Notes (Signed)
PARENTERAL NUTRITION CONSULT NOTE - INITIAL  Pharmacy Consult:  TPN Indication:  Bowel obstruction  No Known Allergies  Patient Measurements: Height: 5\' 6"  (167.6 cm) Weight: 227 lb 8.2 oz (103.2 kg) IBW/kg (Calculated) : 63.8 Adjusted BW = 76 kg  Vital Signs: Temp: 97.5 F (36.4 C) (12/23 0749) Temp src: Oral (12/23 0749) BP: 168/74 mmHg (12/23 0749) Pulse Rate: 85 (12/23 0749) Intake/Output from previous day: 12/22 0701 - 12/23 0700 In: 2077.1 [P.O.:20; I.V.:1517.1; TPN:540] Out: 3475 [Urine:1175; Emesis/NG output:2300] Intake/Output from this shift:    Labs:  Recent Labs  07/26/13 0350  WBC 6.4  HGB 13.0  HCT 38.4*  PLT 200     Recent Labs  07/24/13 0600 07/25/13 0420 07/26/13 0350  NA 145 147* 151*  K 3.7 3.9 4.0  CL 109 110 116*  CO2 27 26 24   GLUCOSE 104* 97 157*  BUN 34* 23 17  CREATININE 1.61* 1.27 1.19  CALCIUM 9.0 9.2 9.2  MG  --   --  2.3  PHOS  --   --  2.6  PROT  --   --  6.2  ALBUMIN  --   --  2.8*  AST  --   --  22  ALT  --   --  17  ALKPHOS  --   --  50  BILITOT  --   --  0.6  TRIG  --   --  124   Estimated Creatinine Clearance: 61.3 ml/min (by C-G formula based on Cr of 1.19).    Recent Labs  07/25/13 2016 07/25/13 2329 07/26/13 0323  GLUCAP 101* 130* 118*    Medical History: Past Medical History  Diagnosis Date  . Hyperlipidemia   . Hypertension   . Hepatitis   . Obesity, morbid   . Benign prostatic hypertrophy   . Cataracts, bilateral   . Carpal tunnel syndrome, bilateral   . Diverticulosis of colon   . Nephrolithiasis   . Low back pain   . ALLERGIC RHINITIS   . Head mass   . Diabetes mellitus type II   . Coronary artery disease     Dr. Tresa Endo; 2D ECHO, 12/11/2011 - EF 50-55%, normal; NUCLEAR STRESS TEST, 10/16/2010 - no evidemce of inducible ischemia  . Occlusion and stenosis of carotid artery without mention of cerebral infarction     CAROTID DOPPLER, 03/18/2012 - srable, mild, hard, plaque noted, bilaterally   . 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 Last nuc 2012 negative. 07/21/2013  . Inguinal hernia, Rt reduced, Lt present 07/21/2013  . Stroke   . GERD (gastroesophageal reflux disease)   . H/O hiatal hernia       Insulin Requirements in the past 24 hours:  1 unit SSI  Assessment: 47 YOM admitted with a one week history of intermittent right groin pain.  Abd CT showed right inguinal hernia with herniation of the distal small bowel loop with severe obstruction.  Patient's condition improved initially but has now declined and surgery may be indicated.  Pharmacy consulted to manage TPN for SBO.  GI: Hx diverticulosis and with SBO secondary to incarcerated R inguinal hernia. To the OR today for hernia repair. NGO/24h: 2400 cc, small BM noted on 12/22.  Endo: DM2 with A1c 6.4%, CBGs/12h: 101-130 Lytes: Na 151 << 147, K 4, CoCa 9.9, Phos 2.6, Mg 2.3 Renal: Hx BPH and with ARI this admit, resolved. SCr 1.19 << 1.27, CrCl~60-70 ml/min (normalized), UOP/24h: 0.5 ml/kg/hr Pulm: 97% on RA  Cards: Hx HTN/CAD/DL - BP elevated, HR wnl-elevated. On IV lopressor Hepatobil: LFTs/Tbili/Alk Phos wnl Neuro: A&O ID: Zosyn D#6 as empiric therapy, afebrile, WBC WNL    Best Practices: Lovenox TPN Access: R-PICC placed 12/22 TPN day#: 1 (12/22 >> current )  Current Nutrition:  Clinimix E 5/15 at 40 ml/hr + IVFE 20% at 5 ml/hr provides 922 kcal and 48 grams of protein per day * Goal rate of Clinimix E 5/15 at 105 ml/hr + IVFE 20% at 10 ml/hr will provide 2269 kcal and 126 grams of protein per day  Nutritional Goals:  2150-2350 kCal, 120-135 grams of protein, 2.1-2.4 L of fluid daily per RD assessment on 07/26/13  Plan:  - Increase Clinimix E 5/15 to 83 ml/hr + IVFE 20% at 10 ml/hr - Daily multivitamin and trace elements - Continue SSI  - Decrease IVF to 40 ml/hr when TPN rate increased at this evening - Standard TPN labs  Georgina Pillion, PharmD, BCPS Clinical Pharmacist Pager:  [phone] 07/26/2013 9:53 AM

## 2013-07-26 NOTE — Transfer of Care (Signed)
Immediate Anesthesia Transfer of Care Note  Patient: Andres [name]  Procedure(s) Performed: Procedure(s): REPAIR  INCARCERATED RIGHT INGUINAL  HERNIA  (Right)  Patient Location: PACU  Anesthesia Type:General  Level of Consciousness: awake, alert  and oriented  Airway & Oxygen Therapy: Patient Spontanous Breathing and Patient connected to nasal cannula oxygen  Post-op Assessment: Report given to PACU RN, Post -op Vital signs reviewed and stable and Patient moving all extremities X 4  Post vital signs: Reviewed and stable  Complications: No apparent anesthesia complications

## 2013-07-27 ENCOUNTER — Encounter (HOSPITAL_COMMUNITY): Payer: Self-pay | Admitting: General Surgery

## 2013-07-27 LAB — CBC WITH DIFFERENTIAL/PLATELET
Basophils Absolute: 0 10*3/uL (ref 0.0–0.1)
Basophils Relative: 0 % (ref 0–1)
Eosinophils Absolute: 0.1 10*3/uL (ref 0.0–0.7)
Eosinophils Relative: 1 % (ref 0–5)
HCT: 36 % — ABNORMAL LOW (ref 39.0–52.0)
Lymphocytes Relative: 20 % (ref 12–46)
MCH: 33.6 pg (ref 26.0–34.0)
MCHC: 34.4 g/dL (ref 30.0–36.0)
MCV: 97.6 fL (ref 78.0–100.0)
Monocytes Absolute: 0.7 10*3/uL (ref 0.1–1.0)
Neutro Abs: 4.1 10*3/uL (ref 1.7–7.7)
RDW: 13.2 % (ref 11.5–15.5)
WBC: 6 10*3/uL (ref 4.0–10.5)

## 2013-07-27 LAB — BASIC METABOLIC PANEL
CO2: 24 mEq/L (ref 19–32)
Calcium: 8.8 mg/dL (ref 8.4–10.5)
Creatinine, Ser: 1.25 mg/dL (ref 0.50–1.35)
GFR calc Af Amer: 64 mL/min — ABNORMAL LOW (ref >90)
GFR calc non Af Amer: 55 mL/min — ABNORMAL LOW (ref >90)
Potassium: 3.9 mEq/L (ref 3.5–5.1)

## 2013-07-27 LAB — GLUCOSE, CAPILLARY

## 2013-07-27 MED ORDER — TRACE MINERALS CR-CU-F-FE-I-MN-MO-SE-ZN IV SOLN
INTRAVENOUS | Status: AC
  Administered 2013-07-27: 17:00:00 via INTRAVENOUS
  Filled 2013-07-27: qty 3000

## 2013-07-27 MED ORDER — FAT EMULSION 20 % IV EMUL
250.0000 mL | INTRAVENOUS | Status: AC
  Administered 2013-07-27: 250 mL via INTRAVENOUS
  Filled 2013-07-27: qty 250

## 2013-07-27 NOTE — Progress Notes (Signed)
[facility] Progress Note [place] TEAM 1 - [facility] ICU Team   Andres [name]:[id number] DOB: September 27, 1940 DOA: 07/21/2013 PCP: Oliver Barre, MD  Brief narrative: 72 y.o. male who initially presented to his PCP's office with a 1 week h/o intermittent right groin pain (has known small RIH) as well as 2-3 days onset hoarseness. He was dyspneic with some difficulty speaking complete sentences, diaphoretic with clammy extremities. He was found to be hypotensive at the office and sent to the ED. The pt admitted to nausea and vomiting x 2 over the past week and his last BM was 2 days prior. Pt also reported a cough productive of phlegm. He admitted to intermittent chest pressure for the past 2 days - and stated that it felt like he needed to belch and was relieved some by tums/peptobismol.   He was seen in ED and abd CT showed right inguinal hernia with herniation of the distal small bowel loop with severe obstruction of the small bowel - also a left inguinal hernia with herniation of fat only.  In addition patchy bilateral pulmonary infiltrates consistent with pneumonitis possibly from aspiration were seen on this CT. Surgery was consulted and the hernia was reduced. Labs showed elevated trop .21, cr 3.6. Cards was consulted.  HPI/Subjective: This patient is alert and interactive.  He has no specific complaints today.  He denies significant abdominal pain or nausea.  He denies chest pain or shortness of breath.  Assessment/Plan:  SBO secondary to incarcerated R inguinal Hernia  hernia reduced per surgery in ED and pt was started on clears - re-developed emesis which prompted insertion of NGT - recurrent obstruction noted on f/u exams/studies so underwent surgical RIH repair on 12/23 - PICC placed 12/22 and TNA per pharmacy initiated - NG remains - diet to be advanced at discretion of Gen Surgery (not ready yet)  Hypotension due to hypovolemia - resolved after aggressive IV  hydration  Elevated troponin - CAD hx of CABG 1997 X 6 Myoview 12/22 for pre op clearance negative for ischemia - enzymes normalized - resume usual cardiac/lipid meds when taking orals  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 - discussed with patient and daughter - no plans for further investigation by Cardiology   Aspiration PNA  Has completed 7 days of emipiric anbx - currently stable on RA - stop abx today   Acute renal failure  likely d/t volume depletion secondary to SBO - may have had a degree of ATN from pre admit HCTZ/ACE - renal US normal  DM  HgbAIC  6.8 - CBG reasonably controlled at this time   DVT prophylaxis: Lovenox Code Status: Full Family Communication: Spoke with patient and sister at bedside Disposition Plan/Expected LOS: Stepdown  Consultants: Cardiology Gen Surgery  Procedures: 2-D echocardiogram  - Left ventricle: The cavity size was normal. Wall thickness was normal. Systolic function was normal. The estimated ejection fraction was in the range of 60% to 65%. Possible hypokinesis of the basal-midinferior myocardium. Left ventricular diastolic function parameters were normal. - Ventricular septum: Septal motion showed paradox. These changes are consistent with a post-thoracotomy state. - Mitral valve: Prolapse, involving the posterior leaflet. Mild to moderate regurgitation directed eccentrically and anteriorly. Impressions: - Consider TEE to better evaluate the mitral valve.  EL with right inguinal hernia repair  12/23  Antibiotics: Zosyn 12/18 >> 12/24  Objective: Blood pressure 164/72, pulse 94, temperature 98.7 F (37.1 C), temperature source Oral, resp. rate 24, height 5\' 6"  (1.676 m),  weight 103.2 kg (227 lb 8.2 oz), SpO2 98.00%.  Intake/Output Summary (Last 24 hours) at 07/27/13 1403 Last data filed at 07/27/13 1150  Gross per 24 hour  Intake   2556 ml  Output   3425 ml  Net   -869 ml    Exam: General: No acute respiratory  distress Lungs: Clear to auscultation bilaterally without wheezes or crackles Cardiovascular: Regular rate and rhythm without murmur gallop or rub, no peripheral edema or JVD Abdomen: midline incision covered by drsg which is dry; NG tube to suction with green bilious output; bowel sounds are absent Musculoskeletal: No significant cyanosis, clubbing of bilateral lower extremities Neurological: Alert and oriented x 3, moves all extremities x 4 without focal neurological deficits, CN 2-12 intact  Scheduled Meds:  Scheduled Meds: . enoxaparin (LOVENOX) injection  40 mg Subcutaneous Q24H  . insulin aspart  0-9 Units Subcutaneous Q6H  . metoprolol  5 mg Intravenous Q6H  . pantoprazole (PROTONIX) IV  40 mg Intravenous Q12H  . piperacillin-tazobactam (ZOSYN)  IV  3.375 g Intravenous Q8H  . sodium chloride  250 mL Intravenous Once  . sodium chloride  10-40 mL Intracatheter Q12H  . sodium chloride  3 mL Intravenous Q12H    Data Reviewed: Basic Metabolic Panel:  Recent Labs Lab 07/23/13 0410 07/24/13 0600 07/25/13 0420 07/26/13 0350 07/27/13 0346  NA 141 145 147* 151* 147*  K 4.0 3.7 3.9 4.0 3.9  CL 102 109 110 116* 113*  CO2 28 27 26 24 24   GLUCOSE 107* 104* 97 157* 141*  BUN 73* 34* 23 17 15   CREATININE 3.51* 1.61* 1.27 1.19 1.25  CALCIUM 8.6 9.0 9.2 9.2 8.8  MG  --   --   --  2.3  --   PHOS  --   --   --  2.6  --    Liver Function Tests:  Recent Labs Lab 07/21/13 1251 07/23/13 0410 07/26/13 0350  AST 30 29 22   ALT 22 18 17   ALKPHOS 63 47 50  BILITOT 1.2 1.1 0.6  PROT 7.1 6.1 6.2  ALBUMIN 4.0 3.0* 2.8*    CBC:  Recent Labs Lab 07/21/13 1251 07/21/13 1318 07/22/13 0500 07/23/13 0410 07/26/13 0350 07/27/13 0346  WBC 7.3  --  6.9 7.8 6.4 6.0  NEUTROABS 5.8  --   --   --  4.0 4.1  HGB 17.1* 17.3* 15.0 13.6 13.0 12.4*  HCT 46.9 51.0 42.6 39.1 38.4* 36.0*  MCV 95.5  --  95.3 96.8 97.7 97.6  PLT 228  --  216 185 200 176   Cardiac Enzymes:  Recent Labs Lab  07/21/13 1715 07/21/13 1811 07/21/13 2300 07/22/13 0600 07/22/13 1142  TROPONINI 0.33* 0.34* <0.30 <0.30 <0.30   CBG:  Recent Labs Lab 07/26/13 1159 07/26/13 1735 07/26/13 2340 07/27/13 0620 07/27/13 1215  GLUCAP 129* 115* 149* 166* 146*    Recent Results (from the past 240 hour(s))  MRSA PCR SCREENING     Status: None   Collection Time    07/21/13  9:59 PM      Result Value Range Status   MRSA by PCR NEGATIVE  NEGATIVE Final   Comment:            The GeneXpert MRSA Assay (FDA     approved for NASAL specimens     only), is one component of a     comprehensive MRSA colonization     surveillance program. It is not     intended to  diagnose MRSA     infection nor to guide or     monitor treatment for     MRSA infections.  URINE CULTURE     Status: None   Collection Time    07/22/13  4:24 PM      Result Value Range Status   Specimen Description URINE, CATHETERIZED   Final   Special Requests NONE   Final   Culture  Setup Time     Final   Value: 07/22/2013 17:18     Performed at [facility]   Colony Count     Final   Value: NO GROWTH     Performed at [facility]   Culture     Final   Value: NO GROWTH     Performed at [facility]   Report Status 07/23/2013 FINAL   Final     Studies:  Recent x-ray studies have been reviewed in detail by the Attending Physician  Lonia Blood, MD [facility] Office  [phone] Pager [phone]  On-Call/Text Page:      [email or web address]      password [facility]  07/27/2013, 2:03 PM

## 2013-07-27 NOTE — Progress Notes (Signed)
PARENTERAL NUTRITION CONSULT NOTE - INITIAL  Pharmacy Consult:  TPN Indication:  Bowel obstruction  No Known Allergies  Patient Measurements: Height: 5\' 6"  (167.6 cm) Weight: 227 lb 8.2 oz (103.2 kg) IBW/kg (Calculated) : 63.8 Adjusted BW = 76 kg  Vital Signs: Temp: 97.9 F (36.6 C) (12/24 0800) Temp src: Oral (12/24 0800) BP: 132/79 mmHg (12/24 0800) Pulse Rate: 94 (12/24 0800) Intake/Output from previous day: 12/23 0701 - 12/24 0700 In: 4296 [P.O.:720; I.V.:1690; IV Piggyback:500; TPN:1386] Out: 3800 [Urine:1625; Emesis/NG output:2150; Blood:25] Intake/Output from this shift: Total I/O In: -  Out: 275 [Urine:275]  Labs:  Recent Labs  07/26/13 0350 07/27/13 0346  WBC 6.4 6.0  HGB 13.0 12.4*  HCT 38.4* 36.0*  PLT 200 176     Recent Labs  07/25/13 0420 07/26/13 0350 07/27/13 0346  NA 147* 151* 147*  K 3.9 4.0 3.9  CL 110 116* 113*  CO2 26 24 24   GLUCOSE 97 157* 141*  BUN 23 17 15   CREATININE 1.27 1.19 1.25  CALCIUM 9.2 9.2 8.8  MG  --  2.3  --   PHOS  --  2.6  --   PROT  --  6.2  --   ALBUMIN  --  2.8*  --   AST  --  22  --   ALT  --  17  --   ALKPHOS  --  50  --   BILITOT  --  0.6  --   PREALBUMIN  --  9.7*  --   TRIG  --  124  --    Estimated Creatinine Clearance: 58.4 ml/min (by C-G formula based on Cr of 1.25).    Recent Labs  07/26/13 1735 07/26/13 2340 07/27/13 0620  GLUCAP 115* 149* 166*    Medical History: Past Medical History  Diagnosis Date  . Hyperlipidemia   . Hypertension   . Hepatitis   . Obesity, morbid   . Benign prostatic hypertrophy   . Cataracts, bilateral   . Carpal tunnel syndrome, bilateral   . Diverticulosis of colon   . Nephrolithiasis   . Low back pain   . ALLERGIC RHINITIS   . Head mass   . Diabetes mellitus type II   . Coronary artery disease     Dr. Tresa Endo; 2D ECHO, 12/11/2011 - EF 50-55%, normal; NUCLEAR STRESS TEST, 10/16/2010 - no evidemce of inducible ischemia  . Occlusion and stenosis of carotid  artery without mention of cerebral infarction     CAROTID DOPPLER, 03/18/2012 - srable, mild, hard, plaque noted, bilaterally  . 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 Last nuc 2012 negative. 07/21/2013  . Inguinal hernia, Rt reduced, Lt present 07/21/2013  . Stroke   . GERD (gastroesophageal reflux disease)   . H/O hiatal hernia       Insulin Requirements in the past 24 hours:  3 units SSI/24h, CBGs 115-166  Assessment: 80 YOM admitted with a one week history of intermittent right groin pain. Abd CT showed right inguinal hernia with herniation of the distal small bowel loop with severe obstruction.  12/23: repair of incarcerated R inguinal hernia. TPN for SBO.  GI: Hx diverticulosis and now with SBO secondary to incarcerated R inguinal hernia. To the OR 12/23 for hernia repair. NGO/24h: 2150 cc, small BM noted on 12/22 and 12/23. Eating a lot of ice chips.Distended, hypoactive bowel sounds. Bilious NGT drainage. Prealbumin 9.7  Endo: DM2 with A1c 6.4%, CBGs/12h: 115-166  Lytes: Na 147, K  3.9, CoCa 9.7, Phos 2.6, Mg 2.3 ok.  Renal: Hx BPH and with ARI this admit, resolved. SCr 1.25, CrCl~55-60 UOP/24h: 0.7 ml/kg/hr  Pulm: 98% on RA  Cards: Hx HTN/CAD/DL -VSS on IV lopressor  Hepatobil: LFTs/Tbili/Alk Phos wnl  Neuro: A&O  ID: Zosyn D#7 as empiric therapy, afebrile, WBC 6  Best Practices: Lovenox 40mg /d  TPN Access: R-PICC placed 12/22  TPN day#: 2 (12/22 >> current )  Current Nutrition:  Clinimix E 5/15 at 83 ml/hr + IVFE 20% at 10 ml/hr provides 1894 kcal and 99.6 grams of protein per day * Goal rate of Clinimix E 5/15 at 105 ml/hr + IVFE 20% at 10 ml/hr will provide 2269 kcal and 126 grams of protein per day  Nutritional Goals:  2150-2350 kCal, 120-135 grams of protein, 2.1-2.4 L of fluid daily per RD assessment on 07/26/13  Plan:  - d/c Ice chips - Increase Clinimix E 5/15 to 105 ml/hr + IVFE 20% at 10 ml/hr=2269 kcal and 126g protein daily - Daily  multivitamin and trace elements - Continue SSI    Ayanna Gheen S. Merilynn Finland, PharmD, [facility] Clinical Staff Pharmacist Pager [phone]  07/27/2013 8:59 AM

## 2013-07-27 NOTE — Progress Notes (Signed)
[facility] Progress Note 1 Day Post-Op  Subjective: Patient sitting up in chair, no distress.  Still a fair amount coming out of NGT, but patient taking a lot of ice chips.  Objective: Vital signs in last 24 hours: Temp:  [96.9 F (36.1 C)-99.6 F (37.6 C)] 97.6 F (36.4 C) (12/24 0323) Pulse Rate:  [91-110] 99 (12/24 0323) Resp:  [18-28] 28 (12/24 0323) BP: (133-151)/(57-94) 133/66 mmHg (12/24 0323) SpO2:  [90 %-99 %] 97 % (12/24 0323) Last BM Date: 07/26/13  Intake/Output from previous day: 12/23 0701 - 12/24 0700 In: 4296 [P.O.:720; I.V.:1690; IV Piggyback:500; TPN:1386] Out: 3800 [Urine:1625; Emesis/NG output:2150; Blood:25] Intake/Output this shift:    General: No acute distress.  Lungs: Clear  Abd: Distended, hypoactive bowel sounds.  Bilious NGT drainage.  Extremities: No clinical signs or symptoms of DVT  Neuro: Intact  Lab Results:   WBC normal, Hgb 12.4. BMET  Recent Labs  07/26/13 0350 07/27/13 0346  NA 151* 147*  K 4.0 3.9  CL 116* 113*  CO2 24 24  GLUCOSE 157* 141*  BUN 17 15  CREATININE 1.19 1.25  CALCIUM 9.2 8.8   PT/INR No results found for this basename: LABPROT, INR,  in the last 72 hours ABG No results found for this basename: PHART, PCO2, PO2, HCO3,  in the last 72 hours  Studies/Results: Nm Myocar Multi W/spect W/wall Motion / Ef  07/25/2013   CLINICAL DATA:  Diabetes, hypertension, preop evaluation. Previous CABG.  EXAM: NUCLEAR MEDICINE MYOCARDIAL PERFUSION IMAGING  NUCLEAR MEDICINE LEFT VENTRICULAR WALL MOTION ANALYSIS  NUCLEAR MEDICINE LEFT VENTRICULAR EJECTION FRACTION CALCULATION  TECHNIQUE: Standard single day myocardial SPECT imaging was performed after resting intravenous injection of Tc-57m sestamibi. After intravenous infusion of Lexiscan (regadenoson) under supervision of cardiology staff, sestamibiwas injected intravenously and standard myocardial SPECT imaging was performed. Quantitative gated imaging was also performed to  evaluate left ventricular wall motion and estimate left ventricular ejection fraction.  Radiopharmaceutical: 10+30 mCi Tc42m sestamibiIV.  COMPARISON:  None  FINDINGS: The stress SPECT images demonstrate mildly decreased activity in the inferolateral aspect of the left ventricle, otherwise physiologic distribution of radiopharmaceutical. Rest images demonstrate similarly decreased inferolateral activity, no new perfusion defects. The gated stress SPECT images mild septal hypokinesis. There is normal thickening and motion of the inferior inferolateral regions. Calculated left ventricular end-diastolic volume 80ml, end-systolic volume 38ml, ejection fraction of 53%.  IMPRESSION: 1. Negative for pharmacologic-stress induced ischemia. 2. Left ventricular ejection fraction 53%. 3. Septal hypokinesis, often associated with coronary bypass grafting. 4. Mild inferolateral attenuation.   Electronically Signed   By: Oley Balm M.D.   On: 07/25/2013 15:10   Dg Abd 2 Views  07/26/2013   CLINICAL DATA:  Evaluate for small bowel obstruction.  EXAM: ABDOMEN - 2 VIEW  COMPARISON:  07/24/2013  FINDINGS: Patient is status post median sternotomy coronary artery bypass grafting. An NG tube is appreciated with tip projecting along the expected course of the stomach.  Air-filled dilated loops of small bowel are appreciated as well as areas of air-fluid levels within small bowel. There is no appreciable air projecting and loops of large bowel. The osseous structures unremarkable. Atherosclerotic calcifications identified within the iliac vessels.  IMPRESSION: Findings consistent with small-bowel obstruction.   Electronically Signed   By: Salome Holmes M.D.   On: 07/26/2013 07:56    Anti-infectives: Anti-infectives   Start     Dose/Rate Route Frequency Ordered Stop   07/26/13 1030  polymyxin B 500,000 Units, bacitracin 50,000 Units in  sodium chloride irrigation 0.9 % 500 mL irrigation  Status:  Discontinued       As needed  07/26/13 1102 07/26/13 1152   07/26/13 0946  ceFAZolin (ANCEF) 2-3 GM-% IVPB SOLR    Comments:  Glo Herring   : cabinet override      07/26/13 0946 07/26/13 2159   07/24/13 1400  piperacillin-tazobactam (ZOSYN) IVPB 3.375 g     3.375 g 12.5 mL/hr over 240 Minutes Intravenous 3 times per day 07/24/13 1003     07/21/13 2200  piperacillin-tazobactam (ZOSYN) IVPB 2.25 g  Status:  Discontinued     2.25 g 100 mL/hr over 30 Minutes Intravenous 3 times per day 07/21/13 2120 07/24/13 1003   07/21/13 1445  piperacillin-tazobactam (ZOSYN) IVPB 3.375 g     3.375 g 100 mL/hr over 30 Minutes Intravenous  Once 07/21/13 1435 07/21/13 1520      Assessment/Plan: s/p Procedure(s): REPAIR  INCARCERATED RIGHT INGUINAL  HERNIA  d/c foley Stop ice chips Keep NGT for now.  LOS: 6 days   Marta Lamas. Gae Bon, MD, FACS [phone] [facility] Surgery 07/27/2013

## 2013-07-28 LAB — GLUCOSE, CAPILLARY
Glucose-Capillary: 164 mg/dL — ABNORMAL HIGH (ref 70–99)
Glucose-Capillary: 77 mg/dL (ref 70–99)

## 2013-07-28 LAB — CBC WITH DIFFERENTIAL/PLATELET
Basophils Relative: 0 % (ref 0–1)
Eosinophils Absolute: 0.1 10*3/uL (ref 0.0–0.7)
Eosinophils Relative: 2 % (ref 0–5)
Hemoglobin: 11.7 g/dL — ABNORMAL LOW (ref 13.0–17.0)
MCH: 33.1 pg (ref 26.0–34.0)
MCHC: 33.9 g/dL (ref 30.0–36.0)
MCV: 97.5 fL (ref 78.0–100.0)
Monocytes Absolute: 0.6 10*3/uL (ref 0.1–1.0)
Monocytes Relative: 10 % (ref 3–12)
Neutrophils Relative %: 64 % (ref 43–77)
Platelets: 177 10*3/uL (ref 150–400)

## 2013-07-28 LAB — COMPREHENSIVE METABOLIC PANEL
AST: 19 U/L (ref 0–37)
Albumin: 2.5 g/dL — ABNORMAL LOW (ref 3.5–5.2)
Alkaline Phosphatase: 45 U/L (ref 39–117)
BUN: 22 mg/dL (ref 6–23)
Chloride: 113 mEq/L — ABNORMAL HIGH (ref 96–112)
GFR calc Af Amer: 71 mL/min — ABNORMAL LOW (ref >90)
Glucose, Bld: 171 mg/dL — ABNORMAL HIGH (ref 70–99)
Potassium: 3.7 mEq/L (ref 3.5–5.1)
Sodium: 147 mEq/L — ABNORMAL HIGH (ref 135–145)
Total Bilirubin: 0.4 mg/dL (ref 0.3–1.2)
Total Protein: 5.8 g/dL — ABNORMAL LOW (ref 6.0–8.3)

## 2013-07-28 LAB — MAGNESIUM: Magnesium: 1.8 mg/dL (ref 1.5–2.5)

## 2013-07-28 MED ORDER — FAT EMULSION 20 % IV EMUL
250.0000 mL | INTRAVENOUS | Status: AC
  Administered 2013-07-28: 250 mL via INTRAVENOUS
  Filled 2013-07-28: qty 250

## 2013-07-28 MED ORDER — SODIUM CHLORIDE 0.9 % IV SOLN: INTRAVENOUS | Status: DC

## 2013-07-28 MED ORDER — ACETAMINOPHEN 10 MG/ML IV SOLN
1000.0000 mg | Freq: Four times a day (QID) | INTRAVENOUS | Status: AC
  Administered 2013-07-28 – 2013-07-29 (×4): 1000 mg via INTRAVENOUS
  Filled 2013-07-28 (×4): qty 100

## 2013-07-28 MED ORDER — INSULIN ASPART 100 UNIT/ML ~~LOC~~ SOLN
0.0000 [IU] | Freq: Four times a day (QID) | SUBCUTANEOUS | Status: DC
  Administered 2013-07-28 – 2013-07-29 (×2): 3 [IU] via SUBCUTANEOUS
  Administered 2013-07-29 (×2): 2 [IU] via SUBCUTANEOUS
  Administered 2013-07-29 – 2013-07-30 (×3): 3 [IU] via SUBCUTANEOUS
  Administered 2013-07-30: 5 [IU] via SUBCUTANEOUS
  Administered 2013-07-30 – 2013-07-31 (×2): 3 [IU] via SUBCUTANEOUS
  Administered 2013-08-01: 2 [IU] via SUBCUTANEOUS

## 2013-07-28 MED ORDER — CLINIMIX E/DEXTROSE (5/15) 5 % IV SOLN
INTRAVENOUS | Status: AC
  Administered 2013-07-28: 18:00:00 via INTRAVENOUS
  Filled 2013-07-28: qty 2520

## 2013-07-28 NOTE — Progress Notes (Signed)
2 Days Post-Op  Subjective: Still pretty bloated, no flatus.  He's not taking anything for pain, he says everytime he ask they tell him he's NPO.  Objective: Vital signs in last 24 hours: Temp:  [97.3 F (36.3 C)-99.2 F (37.3 C)] 98.6 F (37 C) (12/25 0822) Pulse Rate:  [89-97] 97 (12/25 0332) Resp:  [19-26] 26 (12/25 0332) BP: (137-164)/(72-99) 153/92 mmHg (12/25 0822) SpO2:  [96 %-100 %] 96 % (12/25 0332) Last BM Date: 07/26/13 Urine recorded, no NG drainage recorded. He is no TNA/NPO Afebrile, BP up in the 90 diastolic range Labs OK, creatinine 1.15 rtoday Intake/Output from previous day: 12/24 0701 - 12/25 0700 In: 4730.8 [I.V.:1000; NG/GT:1000; IV Piggyback:100; TPN:2630.8] Out: 1575 [Urine:1575] Intake/Output this shift: Total I/O In: 155 [I.V.:40; TPN:115] Out: -   General appearance: alert, cooperative and no distress Resp: BS down in the bases. GI: distended, no BS, incision looks fine.  Lab Results:   Recent Labs  07/27/13 0346 07/28/13 0450  WBC 6.0 5.6  HGB 12.4* 11.7*  HCT 36.0* 34.5*  PLT 176 177    BMET  Recent Labs  07/27/13 0346 07/28/13 0450  NA 147* 147*  K 3.9 3.7  CL 113* 113*  CO2 24 24  GLUCOSE 141* 171*  BUN 15 22  CREATININE 1.25 1.15  CALCIUM 8.8 9.0   PT/INR No results found for this basename: LABPROT, INR,  in the last 72 hours   Recent Labs Lab 07/21/13 1251 07/23/13 0410 07/26/13 0350 07/28/13 0450  AST 30 29 22 19   ALT 22 18 17 13   ALKPHOS 63 47 50 45  BILITOT 1.2 1.1 0.6 0.4  PROT 7.1 6.1 6.2 5.8*  ALBUMIN 4.0 3.0* 2.8* 2.5*     Lipase  No results found for this basename: lipase     Studies/Results: No results found.  Medications: . enoxaparin (LOVENOX) injection  40 mg Subcutaneous Q24H  . insulin aspart  0-15 Units Subcutaneous Q6H  . metoprolol  5 mg Intravenous Q6H  . pantoprazole (PROTONIX) IV  40 mg Intravenous Q12H  . sodium chloride  250 mL Intravenous Once   Prior to Admission  medications   Medication Sig Start Date End Date Taking? Authorizing Provider  acetaminophen (TYLENOL) 325 MG tablet Take 325 mg by mouth every 6 (six) hours as needed for mild pain.   Yes Historical Provider, MD  Ascorbic Acid (VITAMIN C) 1000 MG tablet Take 1,000 mg by mouth daily.   Yes Historical Provider, MD  Aspirin (ADULT ASPIRIN LOW STRENGTH) 81 MG EC tablet Take 81 mg by mouth daily.     Yes Historical Provider, MD  atorvastatin (LIPITOR) 40 MG tablet Take 1 tablet (40 mg total) by mouth daily. 05/25/13  Yes Corwin Levins, MD  clopidogrel (PLAVIX) 75 MG tablet Take 1 tablet (75 mg total) by mouth daily. 05/25/13  Yes Corwin Levins, MD  diclofenac sodium (VOLTAREN) 1 % GEL Apply 2 g topically 4 (four) times daily as needed (for arthritis).   Yes Historical Provider, MD  folic acid (FOLVITE) 1 MG tablet Take 1 tablet (1 mg total) by mouth daily. 06/13/13  Yes Lennette Bihari, MD  losartan-hydrochlorothiazide (HYZAAR) 100-12.5 MG per tablet Take 1 tablet by mouth daily. 05/25/13 05/25/14 Yes Corwin Levins, MD  metoprolol (LOPRESSOR) 50 MG tablet Take 1 tablet (50 mg total) by mouth 2 (two) times daily. 05/25/13  Yes Corwin Levins, MD  Multiple Vitamin (MULTIVITAMIN) capsule Take 1 capsule by mouth daily.  Yes Historical Provider, MD  niacin (NIASPAN) 750 MG CR tablet Take 750 mg by mouth at bedtime.   Yes Historical Provider, MD  Omega-3 Fatty Acids (OMEGA 3 PO) Take 360 mg by mouth 2 (two) times daily.   Yes Historical Provider, MD  tamsulosin (FLOMAX) 0.4 MG CAPS capsule Take 1 capsule (0.4 mg total) by mouth daily. 07/18/13   Corwin Levins, MD     Assessment/Plan right inguinal hernia. Incarcerated with small bowel S/p HERNIA REPAIR INGUINAL ADULT, 07/26/2013, Cherylynn Ridges, MD  He also has a [facility] Post op ileus 2. Hypotension  3. Acute renal failure now resolved.  4. Possible aspiration pneumonia  5. CAD, s/p CABG with positive troponin (.21) on Plavix  6. Hx of hypertension  7. AODM   8. Hyperlipidemia  9. PVD  10. 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 Hx of hepatitis  12. BPH   Plan:  He can go to [address] [place] if OK with medicine. I will continue NG, get him up, and start ambulating him.  He needs IS and some time to get over this. i will get him some IV tylenol and have told him to ask for pain medicine.    LOS: 7 days    Andres [name] 07/28/2013

## 2013-07-28 NOTE — Progress Notes (Signed)
CALLED TO ASSESS PT'S RIGHT ARM PICC FOR LUMEN BREAK. UPON ENTERING ROOM FOUND THAT PURPLE HUB HAD BEEN PULLED OFF THE END OF THE LUMEN. LUMEN WAS CLAPPED CLOSED, TNA AND LIPIDS ARE INFUSING IN 2ND PORT. TNA RATE DECREASED TO 50/ML/HR TO PREPARE FOR EXCHANGE, AWAITING MD ORDER TO EXCHANGE AT CURRENT TIME, STAFF RN CAROL AWARE AND CALLING MD.

## 2013-07-28 NOTE — Progress Notes (Signed)
[facility] Progress Note [place] TEAM 1 - [facility] ICU Team   Andres [name] HYQ:[id number] DOB: 01/05/1941 DOA: 07/21/2013 PCP: Oliver Barre, MD  Brief narrative: 72 y.o. male who initially presented to his PCP's office with a 1 week h/o intermittent right groin pain (has known small RIH) as well as 2-3 days onset hoarseness. He was dyspneic with some difficulty speaking complete sentences, diaphoretic with clammy extremities. He was found to be hypotensive at the office and sent to the ED. The pt admitted to nausea and vomiting x 2 over the past week and his last BM was 2 days prior. Pt also reported a cough productive of phlegm. He admitted to intermittent chest pressure for the past 2 days - and stated that it felt like he needed to belch and was relieved some by tums/peptobismol.   He was seen in ED and abd CT showed right inguinal hernia with herniation of the distal small bowel loop with severe obstruction of the small bowel - also a left inguinal hernia with herniation of fat only.  In addition patchy bilateral pulmonary infiltrates consistent with pneumonitis possibly from aspiration were seen on this CT. Surgery was consulted and the hernia was reduced. Labs showed elevated trop .21, cr 3.6. Cards was consulted.  HPI/Subjective: Pt is resting comfortably.  Not yet moving his bowels or passing gas.  Denies cp, n/v, or abdom pain.    Assessment/Plan:  SBO secondary to incarcerated R inguinal Hernia  hernia reduced per surgery in ED and pt was started on clears - re-developed emesis which prompted insertion of NGT - recurrent obstruction noted on f/u exams/studies so underwent surgical RIH repair on 12/23 - PICC placed 12/22 and TNA per pharmacy initiated - NG remains - diet to be advanced at discretion of Gen Surgery (not ready yet)  Hypotension due to hypovolemia - resolved after aggressive IV hydration  Elevated troponin - CAD hx of CABG 1997 X 6 Myoview 12/22 for pre op  clearance negative for ischemia - enzymes normalized - resume usual cardiac/lipid meds when taking orals  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 - no plans for further investigation by Cardiology   Aspiration PNA  Has completed 7 days of emipiric anbx - currently stable on RA  Acute renal failure  likely d/t volume depletion secondary to SBO - may have had a degree of ATN from pre admit HCTZ/ACE - renal US normal  DM  HgbAIC  6.8 - CBG trending upward - pt is on TNA and Pharmacy will adjust insulin dosing in mix if CBG continues to climb   DVT prophylaxis: Lovenox Code Status: Full Family Communication: Spoke with patient at bedside Disposition Plan/Expected LOS: stable for transfer to medical bed (no need for tele)  Consultants: Cardiology Gen Surgery  Procedures: 2-D echocardiogram  - Left ventricle: The cavity size was normal. Wall thickness was normal. Systolic function was normal. The estimated ejection fraction was in the range of 60% to 65%. Possible hypokinesis of the basal-midinferior myocardium. Left ventricular diastolic function parameters were normal. - Ventricular septum: Septal motion showed paradox. These changes are consistent with a post-thoracotomy state. - Mitral valve: Prolapse, involving the posterior leaflet. Mild to moderate regurgitation directed eccentrically and anteriorly. Impressions: - Consider TEE to better evaluate the mitral valve.  EL with right inguinal hernia repair  12/23  Antibiotics: Zosyn 12/18 >> 12/24  Objective: Blood pressure 151/80, pulse 85, temperature 98 F (36.7 C), temperature source Oral, resp. rate 22, height 5'  6" (1.676 m), weight 103.2 kg (227 lb 8.2 oz), SpO2 100.00%.  Intake/Output Summary (Last 24 hours) at 07/28/13 1637 Last data filed at 07/28/13 1200  Gross per 24 hour  Intake 4955.8 ml  Output   2175 ml  Net 2780.8 ml   Exam: General: No acute respiratory distress Lungs: Clear to auscultation  bilaterally without wheezes or crackles Cardiovascular: Regular rate and rhythm without murmur gallop or rub, no peripheral edema or JVD Abdomen: NG tube to suction with green bilious output; bowel sounds are absent, no rebound  Musculoskeletal: No significant cyanosis, clubbing of bilateral lower extremities Neurological: Alert and oriented x 3, moves all extremities x 4 without focal neurological deficits, CN 2-12 intact  Scheduled Meds:  Scheduled Meds: . acetaminophen  1,000 mg Intravenous Q6H  . enoxaparin (LOVENOX) injection  40 mg Subcutaneous Q24H  . insulin aspart  0-15 Units Subcutaneous Q6H  . metoprolol  5 mg Intravenous Q6H  . pantoprazole (PROTONIX) IV  40 mg Intravenous Q12H  . sodium chloride  250 mL Intravenous Once   Data Reviewed: Basic Metabolic Panel:  Recent Labs Lab 07/24/13 0600 07/25/13 0420 07/26/13 0350 07/27/13 0346 07/28/13 0450  NA 145 147* 151* 147* 147*  K 3.7 3.9 4.0 3.9 3.7  CL 109 110 116* 113* 113*  CO2 27 26 24 24 24   GLUCOSE 104* 97 157* 141* 171*  BUN 34* 23 17 15 22   CREATININE 1.61* 1.27 1.19 1.25 1.15  CALCIUM 9.0 9.2 9.2 8.8 9.0  MG  --   --  2.3  --  1.8  PHOS  --   --  2.6  --  4.0   Liver Function Tests:  Recent Labs Lab 07/23/13 0410 07/26/13 0350 07/28/13 0450  AST 29 22 19   ALT 18 17 13   ALKPHOS 47 50 45  BILITOT 1.1 0.6 0.4  PROT 6.1 6.2 5.8*  ALBUMIN 3.0* 2.8* 2.5*    CBC:  Recent Labs Lab 07/22/13 0500 07/23/13 0410 07/26/13 0350 07/27/13 0346 07/28/13 0450  WBC 6.9 7.8 6.4 6.0 5.6  NEUTROABS  --   --  4.0 4.1 3.6  HGB 15.0 13.6 13.0 12.4* 11.7*  HCT 42.6 39.1 38.4* 36.0* 34.5*  MCV 95.3 96.8 97.7 97.6 97.5  PLT 216 185 200 176 177   Cardiac Enzymes:  Recent Labs Lab 07/21/13 1715 07/21/13 1811 07/21/13 2300 07/22/13 0600 07/22/13 1142  TROPONINI 0.33* 0.34* <0.30 <0.30 <0.30   CBG:  Recent Labs Lab 07/27/13 0620 07/27/13 1215 07/27/13 1721 07/27/13 2346 07/28/13 0621  GLUCAP  166* 146* 114* 164* 176*    Recent Results (from the past 240 hour(s))  MRSA PCR SCREENING     Status: None   Collection Time    07/21/13  9:59 PM      Result Value Range Status   MRSA by PCR NEGATIVE  NEGATIVE Final   Comment:            The GeneXpert MRSA Assay (FDA     approved for NASAL specimens     only), is one component of a     comprehensive MRSA colonization     surveillance program. It is not     intended to diagnose MRSA     infection nor to guide or     monitor treatment for     MRSA infections.  URINE CULTURE     Status: None   Collection Time    07/22/13  4:24 PM  Result Value Range Status   Specimen Description URINE, CATHETERIZED   Final   Special Requests NONE   Final   Culture  Setup Time     Final   Value: 07/22/2013 17:18     Performed at [facility]   Col[place] Count     Final   Value: NO GROWTH     Performed at [facility]   Culture     Final   Value: NO GROWTH     Performed at [facility]   Report Status 07/23/2013 FINAL   Final     Studies:  Recent x-ray studies have been reviewed in detail by the Attending Physician  Lonia Blood, MD [facility] Office  [phone] Pager [phone]  On-Call/Text Page:      [email or web address]      password [facility]  07/28/2013, 4:37 PM

## 2013-07-28 NOTE — Progress Notes (Signed)
PARENTERAL NUTRITION CONSULT NOTE - FOLLOW UP  Pharmacy Consult:  TPN Indication:  Bowel obstruction  No Known Allergies  Patient Measurements: Height: 5\' 6"  (167.6 cm) Weight: 227 lb 8.2 oz (103.2 kg) IBW/kg (Calculated) : 63.8 Adjusted BW = 76 kg  Vital Signs: Temp: 97.3 F (36.3 C) (12/25 0332) Temp src: Oral (12/25 0332) BP: 137/99 mmHg (12/25 0332) Pulse Rate: 97 (12/25 0332) Intake/Output from previous day: 12/24 0701 - 12/25 0700 In: 4575.8 [I.V.:960; NG/GT:1000; IV Piggyback:100; TPN:2515.8] Out: 1575 [Urine:1575]  Labs:  Recent Labs  07/26/13 0350 07/27/13 0346 07/28/13 0450  WBC 6.4 6.0 5.6  HGB 13.0 12.4* 11.7*  HCT 38.4* 36.0* 34.5*  PLT 200 176 177     Recent Labs  07/26/13 0350 07/27/13 0346 07/28/13 0450  NA 151* 147* 147*  K 4.0 3.9 3.7  CL 116* 113* 113*  CO2 24 24 24   GLUCOSE 157* 141* 171*  BUN 17 15 22   CREATININE 1.19 1.25 1.15  CALCIUM 9.2 8.8 9.0  MG 2.3  --  1.8  PHOS 2.6  --  4.0  PROT 6.2  --  5.8*  ALBUMIN 2.8*  --  2.5*  AST 22  --  19  ALT 17  --  13  ALKPHOS 50  --  45  BILITOT 0.6  --  0.4  PREALBUMIN 9.7*  --   --   TRIG 124  --   --    Estimated Creatinine Clearance: 63.4 ml/min (by C-G formula based on Cr of 1.15).    Recent Labs  07/27/13 1721 07/27/13 2346 07/28/13 0621  GLUCAP 114* 164* 176*      Insulin Requirements in the past 24 hours:  4 unit SSI  Assessment: 37 YOM admitted with a one week history of intermittent right groin pain.  Abd CT showed right inguinal hernia with herniation of the distal small bowel loop with severe obstruction.  Patient's condition improved initially but has now declined and surgery may be indicated.  Pharmacy consulted to manage TPN for SBO.  GI: Hx diverticulosis and with SBO secondary to incarcerated right inguinal hernia, s/p hernia repair 12/23.  Prealbumin 9.7, LBM 12/23, no NG O/P Endo: DM2 with A1c 6.4%, CBGs trending up (114-176) on SSI Lytes: Na/CL  elevated, others WNL Renal: Hx BPH and with ARI this admit, resolved. SCr down 1.15, CrCL 63 ml/min, decent UOP, NS at 40 ml/hr Pulm: stable on RA Cards: Hx HTN/CAD/DL - BP/HR improving - on IV Lopressor Hepatobil: LFTs/Tbili/Alk Phos WNL Neuro: A&O ID: Zosyn D#7 as empiric therapy, afebrile, WBC WNL    Best Practices: Lovenox TPN Access: R-PICC placed 12/22 TPN day#: 2 (12/22 >> current )  Current Nutrition:  Goal rate of Clinimix E 5/15 at 105 ml/hr + IVFE 20% at 10 ml/hr will provide 2269 kcal and 126 grams of protein per day  Nutritional Goals:  2150-2350 kCal, 120-135 grams of protein, 2.1-2.4 L of fluid daily per RD assessment on 07/26/13   Plan:  - Continue Clinimix E 5/15 at 105 ml/hr + IVFE 20% at 10 ml/hr - Daily multivitamin and trace elements - Change SSI to moderate scale, add insulin to TPN if CBGs continue to trend up - Decrease IVF to [facility] when new TPN bag starts today at 1800    Debera Sterba D. Laney Potash, PharmD, BCPS Pager:  [phone] 07/28/2013, 7:33 AM

## 2013-07-28 NOTE — Progress Notes (Signed)
Report called to 6N RN, meds current to time, sister at bedside, belongs packed to be taken with patient to new room. Will continue to monitor until transfer, will remain on monitor.

## 2013-07-29 LAB — GLUCOSE, CAPILLARY
Glucose-Capillary: 151 mg/dL — ABNORMAL HIGH (ref 70–99)
Glucose-Capillary: 149 mg/dL — ABNORMAL HIGH (ref 70–99)
Glucose-Capillary: 135 mg/dL — ABNORMAL HIGH (ref 70–99)

## 2013-07-29 MED ORDER — FAT EMULSION 20 % IV EMUL
250.0000 mL | INTRAVENOUS | Status: AC
  Administered 2013-07-29: 250 mL via INTRAVENOUS
  Filled 2013-07-29: qty 250

## 2013-07-29 MED ORDER — BIOTENE DRY MOUTH MT LIQD
15.0000 mL | Freq: Two times a day (BID) | OROMUCOSAL | Status: DC
  Administered 2013-07-29 – 2013-07-31 (×6): 15 mL via OROMUCOSAL

## 2013-07-29 MED ORDER — SODIUM CHLORIDE 0.45 % IV SOLN
INTRAVENOUS | Status: DC
  Administered 2013-07-29: 11:00:00 via INTRAVENOUS

## 2013-07-29 MED ORDER — TRACE MINERALS CR-CU-F-FE-I-MN-MO-SE-ZN IV SOLN
INTRAVENOUS | Status: AC
  Administered 2013-07-29: 18:00:00 via INTRAVENOUS
  Filled 2013-07-29: qty 2520

## 2013-07-29 NOTE — Progress Notes (Signed)
3 Days Post-Op  Subjective: Pt doing well.  Pain well controlled.  Ambulating some.  NPO with NG tube currently.  No N/V.  Had a big BM this am and flatus.    Objective: Vital signs in last 24 hours: Temp:  [98 F (36.7 C)-98.7 F (37.1 C)] 98.6 F (37 C) (12/26 0533) Pulse Rate:  [78-96] 96 (12/26 0533) Resp:  [18-22] 18 (12/26 0533) BP: (140-158)/(64-84) 140/76 mmHg (12/26 0533) SpO2:  [98 %-100 %] 98 % (12/26 0533) Last BM Date: 07/26/13 (acc. to pts memory)  Intake/Output from previous day: 12/25 0701 - 12/26 0700 In: 3087 [I.V.:442; ZOX:0960] Out: 2900 [Urine:1650; Emesis/NG output:1250] Intake/Output this shift:    PE: Gen:  Alert, NAD, pleasant Abd: Soft, mild tenderness, ND, +BS, no HSM, incision in RLQ C/D/I   Lab Results:   Recent Labs  07/27/13 0346 07/28/13 0450  WBC 6.0 5.6  HGB 12.4* 11.7*  HCT 36.0* 34.5*  PLT 176 177   BMET  Recent Labs  07/27/13 0346 07/28/13 0450  NA 147* 147*  K 3.9 3.7  CL 113* 113*  CO2 24 24  GLUCOSE 141* 171*  BUN 15 22  CREATININE 1.25 1.15  CALCIUM 8.8 9.0   PT/INR No results found for this basename: LABPROT, INR,  in the last 72 hours CMP     Component Value Date/Time   NA 147* 07/28/2013 0450   K 3.7 07/28/2013 0450   CL 113* 07/28/2013 0450   CO2 24 07/28/2013 0450   GLUCOSE 171* 07/28/2013 0450   BUN 22 07/28/2013 0450   CREATININE 1.15 07/28/2013 0450   CALCIUM 9.0 07/28/2013 0450   PROT 5.8* 07/28/2013 0450   ALBUMIN 2.5* 07/28/2013 0450   AST 19 07/28/2013 0450   ALT 13 07/28/2013 0450   ALKPHOS 45 07/28/2013 0450   BILITOT 0.4 07/28/2013 0450   GFRNONAA 61* 07/28/2013 0450   GFRAA 71* 07/28/2013 0450   Lipase  No results found for this basename: lipase       Studies/Results: No results found.  Anti-infectives: Anti-infectives   Start     Dose/Rate Route Frequency Ordered Stop   07/26/13 1030  polymyxin B 500,000 Units, bacitracin 50,000 Units in sodium chloride irrigation 0.9 %  500 mL irrigation  Status:  Discontinued       As needed 07/26/13 1102 07/26/13 1152   07/26/13 0946  ceFAZolin (ANCEF) 2-3 GM-% IVPB SOLR    Comments:  Glo Herring   : cabinet override      07/26/13 0946 07/26/13 2159   07/24/13 1400  piperacillin-tazobactam (ZOSYN) IVPB 3.375 g     3.375 g 12.5 mL/hr over 240 Minutes Intravenous 3 times per day 07/24/13 1003 07/28/13 0140   07/21/13 2200  piperacillin-tazobactam (ZOSYN) IVPB 2.25 g  Status:  Discontinued     2.25 g 100 mL/hr over 30 Minutes Intravenous 3 times per day 07/21/13 2120 07/24/13 1003   07/21/13 1445  piperacillin-tazobactam (ZOSYN) IVPB 3.375 g     3.375 g 100 mL/hr over 30 Minutes Intravenous  Once 07/21/13 1435 07/21/13 1520       Assessment/Plan Right inguinal hernia. Incarcerated with small bowel  POD #3 S/p HERNIA REPAIR INGUINAL ADULT, 07/26/2013, Cherylynn Ridges, MD   -He also has a [facility]   -Post op ileus seems improved 2. Hypotension  3. Acute renal failure now resolved 4. Possible aspiration pneumonia  5. CAD, s/p CABG with positive troponin (.21) on Plavix  6. Hx of hypertension  7.  AODM  8. Hyperlipidemia  9. PVD  10. 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 Hx of hepatitis  12. BPH  13. PCM on TNA  Plan:  1.  On 6N, continue pain control, antiemetics 2.  Ambulating and IS 3.  Appreciate medicines management of his chronic medical problems 4.  DVT proph- scd's and lovenox 5.  Had a large BM this am and NG output is , but having flatus and BS, will clamp NG and see if he tolerates this.  Hopefully d/c NG later today or tomorrow.  Continue TNA for now, but can likely start weaning tomorrow if he's started on a diet. 6.  Repeat labs in am 7.  Home when tolerating regular diet and having BM's    LOS: 8 days    DORT, Kinzy Weyers 07/29/2013, 11:10 AM Pager: [phone]

## 2013-07-29 NOTE — Progress Notes (Signed)
PARENTERAL NUTRITION CONSULT NOTE - FOLLOW UP  Pharmacy Consult:  TPN Indication:  Bowel obstruction  No Known Allergies  Patient Measurements: Height: 5\' 6"  (167.6 cm) Weight: 227 lb 8.2 oz (103.2 kg) IBW/kg (Calculated) : 63.8 Adjusted BW = 76 kg  Vital Signs: Temp: 98.6 F (37 C) (12/26 0533) Temp src: Oral (12/26 0533) BP: 140/76 mmHg (12/26 0533) Pulse Rate: 96 (12/26 0533) Intake/Output from previous day: 12/25 0701 - 12/26 0700 In: 3087 [I.V.:442; ZOX:0960] Out: 2900 [Urine:1650; Emesis/NG output:1250]  Labs:  Recent Labs  07/27/13 0346 07/28/13 0450  WBC 6.0 5.6  HGB 12.4* 11.7*  HCT 36.0* 34.5*  PLT 176 177     Recent Labs  07/27/13 0346 07/28/13 0450  NA 147* 147*  K 3.9 3.7  CL 113* 113*  CO2 24 24  GLUCOSE 141* 171*  BUN 15 22  CREATININE 1.25 1.15  CALCIUM 8.8 9.0  MG  --  1.8  PHOS  --  4.0  PROT  --  5.8*  ALBUMIN  --  2.5*  AST  --  19  ALT  --  13  ALKPHOS  --  45  BILITOT  --  0.4   Estimated Creatinine Clearance: 63.4 ml/min (by C-G formula based on Cr of 1.15).    Recent Labs  07/28/13 1752 07/28/13 2332 07/29/13 0551  GLUCAP 77 173* 151*      Insulin Requirements in the past 24 hours:  7 units moderate SSI  Assessment: 22 YOM admitted with a one week history of intermittent right groin pain.  Abd CT showed right inguinal hernia with herniation of the distal small bowel loop with severe obstruction.  Patient's condition improved initially but has now declined and surgery may be indicated.  Pharmacy consulted to manage TPN for SBO.  Aware of lumen break and TPN rate adjustment.  GI: Hx diverticulosis and with SBO secondary to incarcerated right inguinal hernia, s/p hernia repair 12/23.  Prealbumin 9.7, LBM 12/23, no NG O/P Endo: DM2 with A1c 6.4%, CBGs 77-173 (CBG of 77 likely d/t access issue, on SSI Lytes: no labs today - Na/CL was elevated Renal: Hx BPH and with ARI this admit, resolved. SCr down 1.15, CrCL 63  ml/min, decent UOP, NS at [facility] Pulm: stable on RA Cards: Hx HTN/CAD/DL - BP/HR high normal - on IV Lopressor Hepatobil: LFTs/Tbili/Alk Phos WNL Neuro: A&O ID: s/p 7d Zosyn as empiric therapy, afebrile, WBC WNL    Best Practices: Lovenox TPN Access: R-PICC placed 12/22 TPN day#: 3 (12/22 >> current )  Current Nutrition:  Goal rate of Clinimix E 5/15 at 105 ml/hr + IVFE 20% at 10 ml/hr will provide 2269 kcal and 126 grams of protein per day  Nutritional Goals:  2150-2350 kCal, 120-135 grams of protein, 2.1-2.4 L of fluid daily per RD assessment on 07/26/13   Plan:  - Continue Clinimix E 5/15 at 105 ml/hr + IVFE 20% at 10 ml/hr - Daily multivitamin and trace elements - Continue to monitor CBGs and add insulin to TPN if indicated - Change NS to 1/2NS - F/U daily    Zoua Caporaso D. Laney Potash, PharmD, BCPS Pager:  [phone] 07/29/2013, 8:14 AM

## 2013-07-29 NOTE — Progress Notes (Signed)
Doing fine.  Can discontinue NGT.  Marta Lamas. Gae Bon, MD, FACS [phone] [facility]

## 2013-07-29 NOTE — Progress Notes (Signed)
[facility] PROGRESS NOTE Interim History: 72 y.o. male who initially presented to his PCP's office with a 1 week h/o intermittent right groin pain (has known small RIH) as well as 2-3 days onset hoarseness. He was dyspneic with some difficulty speaking complete sentences, diaphoretic with clammy extremities. He was found to be hypotensive at the office and sent to the ED. The pt admitted to nausea and vomiting x 2 over the past week and his last BM was 2 days prior. Pt also reported a cough productive of phlegm. He admitted to intermittent chest pressure for the past 2 days - and stated that it felt like he needed to belch and was relieved some by tums/peptobismol    Assessment/Plan: SBO secondary to incarcerated R inguinal Hernia  - -developed emesis which prompted insertion of NGT  - recurrent obstruction noted on f/u exams/studies so underwent surgical RIH repair on 12/23  - PICC placed 12/22 and TNA per pharmacy initiated - diet per surgery.  Hypotension  due to hypovolemia - resolved after aggressive IV hydration.  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 - no plans for further investigation by Cardiology   Aspiration PNA  Has completed 7 days of emipiric anbx - currently stable on RA   Acute renal failure  likely d/t volume depletion secondary to SBO - may have had a degree of ATN from pre admit HCTZ/ACE - renal US normal  DM  HgbAIC 6.8 - CBG trending upward - pt is on TNA and Pharmacy will adjust insulin dosing in mix if CBG continues to climb   Code Status: Full  Family Communication: Spoke with patient at bedside  Disposition Plan/Expected LOS: stable for transfer to medical bed (no need for tele)    Consultants:  Cardiology  gen surgery  Procedures: -D echocardiogram  - Left  ventricle: The cavity size was normal. Wall thickness was normal. Systolic function was normal. The estimated ejection fraction was in the range of 60% to 65%. Possible hypokinesis of the basal-midinferior myocardium. Left ventricular diastolic function parameters were normal. - Ventricular septum: Septal motion showed paradox. These changes are consistent with a post-thoracotomy state. - Mitral valve: Prolapse, involving the posterior leaflet. Mild to moderate regurgitation directed eccentrically and anteriorly. Impressions: - Consider TEE to better evaluate the mitral valve. myoview negative for ischemia.  Antibiotics: Antibiotics:  Zosyn 12/18 >> 12/24   HPI/Subjective: NG tube bothers him  Objective: Filed Vitals:   07/28/13 1700 07/28/13 2120 07/28/13 2300 07/29/13 0533  BP: 151/84 143/64 141/73 140/76  Pulse: 78 93 85 96  Temp: 98.7 F (37.1 C) 98.7 F (37.1 C)  98.6 F (37 C)  TempSrc: Oral Oral  Oral  Resp:  18  18  Height:      Weight:      SpO2:  100%  98%    Intake/Output Summary (Last 24 hours) at 07/29/13 1018 Last data filed at 07/29/13 0600  Gross per 24 hour  Intake   2622 ml  Output   2500 ml  Net    122 ml   Filed Weights   07/21/13 2100  Weight: 103.2 kg (227 lb 8.2 oz)    Exam:  General: Alert, awake, oriented x3, in no acute distress.  HEENT: No bruits, no goiter. NG inplace. Heart:  Regular rate and rhythm, without murmurs, rubs, gallops.  Lungs: Good air movement, bilateral air movement.    Data Reviewed: Basic Metabolic Panel:  Recent Labs Lab 07/24/13 0600 07/25/13 0420 07/26/13 0350 07/27/13 0346 07/28/13 0450  NA 145 147* 151* 147* 147*  K 3.7 3.9 4.0 3.9 3.7  CL 109 110 116* 113* 113*  CO2 27 26 24 24 24   GLUCOSE 104* 97 157* 141* 171*  BUN 34* 23 17 15 22   CREATININE 1.61* 1.27 1.19 1.25 1.15  CALCIUM 9.0 9.2 9.2 8.8 9.0  MG  --   --  2.3  --  1.8  PHOS  --   --  2.6  --  4.0   Liver Function Tests:  Recent  Labs Lab 07/23/13 0410 07/26/13 0350 07/28/13 0450  AST 29 22 19   ALT 18 17 13   ALKPHOS 47 50 45  BILITOT 1.1 0.6 0.4  PROT 6.1 6.2 5.8*  ALBUMIN 3.0* 2.8* 2.5*   No results found for this basename: LIPASE, AMYLASE,  in the last 168 hours No results found for this basename: AMMONIA,  in the last 168 hours CBC:  Recent Labs Lab 07/23/13 0410 07/26/13 0350 07/27/13 0346 07/28/13 0450  WBC 7.8 6.4 6.0 5.6  NEUTROABS  --  4.0 4.1 3.6  HGB 13.6 13.0 12.4* 11.7*  HCT 39.1 38.4* 36.0* 34.5*  MCV 96.8 97.7 97.6 97.5  PLT 185 200 176 177   Cardiac Enzymes:  Recent Labs Lab 07/22/13 1142  TROPONINI <0.30   BNP (last 3 results) No results found for this basename: PROBNP,  in the last 8760 hours CBG:  Recent Labs Lab 07/27/13 2346 07/28/13 0621 07/28/13 1752 07/28/13 2332 07/29/13 0551  GLUCAP 164* 176* 77 173* 151*    Recent Results (from the past 240 hour(s))  MRSA PCR SCREENING     Status: None   Collection Time    07/21/13  9:59 PM      Result Value Range Status   MRSA by PCR NEGATIVE  NEGATIVE Final   Comment:            The GeneXpert MRSA Assay (FDA     approved for NASAL specimens     only), is one component of a     comprehensive MRSA colonization     surveillance program. It is not     intended to diagnose MRSA     infection nor to guide or     monitor treatment for     MRSA infections.  URINE CULTURE     Status: None   Collection Time    07/22/13  4:24 PM      Result Value Range Status   Specimen Description URINE, CATHETERIZED   Final   Special Requests NONE   Final   Culture  Setup Time     Final   Value: 07/22/2013 17:18     Performed at [facility] Count     Final   Value: NO GROWTH     Performed at [facility]   Culture     Final   Value: NO GROWTH     Performed at [facility]   Report Status 07/23/2013 FINAL   Final     Studies: No results found.  Scheduled Meds: . antiseptic oral rinse  15  mL Mouth Rinse BID  . enoxaparin (LOVENOX) injection  40 mg Subcutaneous Q24H  . insulin aspart  0-15 Units Subcutaneous Q6H  .  metoprolol  5 mg Intravenous Q6H  . pantoprazole (PROTONIX) IV  40 mg Intravenous Q12H   Continuous Infusions: . sodium chloride    . Marland KitchenTPN (CLINIMIX-E) Adult 105 mL/hr at 07/28/13 1749   And  . fat emulsion 250 mL (07/28/13 1749)  . Marland KitchenTPN (CLINIMIX-E) Adult     And  . fat emulsion       Marinda Elk  [facility] Pager [phone]. If 8PM-8AM, please contact night-coverage at [email or web address], password [facility] 07/29/2013, 10:18 AM  LOS: 8 days

## 2013-07-30 LAB — BASIC METABOLIC PANEL
BUN: 20 mg/dL (ref 6–23)
CO2: 25 mEq/L (ref 19–32)
Calcium: 9 mg/dL (ref 8.4–10.5)
Creatinine, Ser: 1 mg/dL (ref 0.50–1.35)
GFR calc Af Amer: 83 mL/min — ABNORMAL LOW (ref >90)
GFR calc non Af Amer: 72 mL/min — ABNORMAL LOW (ref >90)
Glucose, Bld: 176 mg/dL — ABNORMAL HIGH (ref 70–99)
Sodium: 139 mEq/L (ref 135–145)

## 2013-07-30 LAB — GLUCOSE, CAPILLARY
Glucose-Capillary: 213 mg/dL — ABNORMAL HIGH (ref 70–99)
Glucose-Capillary: 160 mg/dL — ABNORMAL HIGH (ref 70–99)

## 2013-07-30 LAB — CBC
MCH: 32.7 pg (ref 26.0–34.0)
MCHC: 33.8 g/dL (ref 30.0–36.0)
MCV: 96.6 fL (ref 78.0–100.0)
Platelets: 179 10*3/uL (ref 150–400)
RBC: 3.49 MIL/uL — ABNORMAL LOW (ref 4.22–5.81)
RDW: 12.8 % (ref 11.5–15.5)

## 2013-07-30 MED ORDER — PHENOL 1.4 % MT LIQD
1.0000 | OROMUCOSAL | Status: DC | PRN
  Filled 2013-07-30: qty 177

## 2013-07-30 MED ORDER — FAT EMULSION 20 % IV EMUL
250.0000 mL | INTRAVENOUS | Status: DC
  Administered 2013-07-30: 250 mL via INTRAVENOUS
  Filled 2013-07-30: qty 250

## 2013-07-30 MED ORDER — TRACE MINERALS CR-CU-F-FE-I-MN-MO-SE-ZN IV SOLN
INTRAVENOUS | Status: DC
  Administered 2013-07-30: 18:00:00 via INTRAVENOUS
  Filled 2013-07-30: qty 2000

## 2013-07-30 NOTE — Progress Notes (Signed)
PARENTERAL NUTRITION CONSULT NOTE - FOLLOW UP  Pharmacy Consult:  TPN Indication:  Bowel obstruction  No Known Allergies  Patient Measurements: Height: 5\' 6"  (167.6 cm) Weight: 227 lb 8.2 oz (103.2 kg) IBW/kg (Calculated) : 63.8 Adjusted BW = 76 kg  Vital Signs: Temp: 99.4 F (37.4 C) (12/27 0533) Temp src: Oral (12/27 0533) BP: 134/82 mmHg (12/27 0533) Pulse Rate: 88 (12/27 0533) Intake/Output from previous day: 12/26 0701 - 12/27 0700 In: 2996.3 [I.V.:351.3; XBJ:4782] Out: 750 [Urine:600; Emesis/NG output:150]  Labs:  Recent Labs  07/28/13 0450 07/30/13 0533  WBC 5.6 7.1  HGB 11.7* 11.4*  HCT 34.5* 33.7*  PLT 177 179     Recent Labs  07/28/13 0450 07/30/13 0533  NA 147* 139  K 3.7 3.8  CL 113* 105  CO2 24 25  GLUCOSE 171* 176*  BUN 22 20  CREATININE 1.15 1.00  CALCIUM 9.0 9.0  MG 1.8  --   PHOS 4.0  --   PROT 5.8*  --   ALBUMIN 2.5*  --   AST 19  --   ALT 13  --   ALKPHOS 45  --   BILITOT 0.4  --    Estimated Creatinine Clearance: 73 ml/min (by C-G formula based on Cr of 1).    Recent Labs  07/29/13 1743 07/30/13 0008 07/30/13 0612  GLUCAP 135* 156* 173*      Insulin Requirements in the past 24 hours:  10 units moderate SSI  Assessment: 67 YOM admitted with a one week history of intermittent right groin pain.  Abd CT showed right inguinal hernia with herniation of the distal small bowel loop with severe obstruction.  Patient's condition improved initially but has now declined and surgery may be indicated.  Pharmacy consulted to manage TPN for SBO.  Patient to begin a clear liquid diet today and Pharmacy to start weaning TPN per PA.  GI: Hx diverticulosis and with SBO secondary to incarcerated right inguinal hernia, s/p hernia repair 12/23.  Prealbumin 9.7, LBM 12/26, +flatus, d/c NGT - PPI IV Endo: DM2 with A1c 6.4%, CBGs < 180 on SSI Lytes: all WNL Renal: Hx BPH and with ARI this admit - SCr down 1, CrCL 73 ml/min, low UOP, 1/2NS at  [facility] Pulm: stable on RA Cards: Hx HTN/CAD/DL - VSS - on IV Lopressor Hepatobil: LFTs/Tbili/Alk Phos WNL Neuro: A&O ID: s/p 7d Zosyn as empiric therapy, afebrile, WBC WNL    Best Practices: Lovenox TPN Access: R-PICC placed 12/22 TPN day#: 4 (12/22 >> current )  Current Nutrition:  Goal rate of Clinimix E 5/15 at 105 ml/hr + IVFE 20% at 10 ml/hr will provide 2269 kcal and 126 grams of protein per day  Nutritional Goals:  2150-2350 kCal, 120-135 grams of protein, 2.1-2.4 L of fluid daily per RD assessment on 07/26/13   Plan:  - Decrease Clinimix E 5/15 to 80 ml/hr + continue IVFE 20% at 10 ml/hr as diet being advanced - Daily multivitamin and trace elements - Continue to monitor CBGs - F/U PO intake/diet advancement to further wean TPN    Chloey Ricard D. Laney Potash, PharmD, BCPS Pager:  [phone] 07/30/2013, 9:15 AM

## 2013-07-30 NOTE — Progress Notes (Signed)
Pt temp 101.7, prn tylenol administered, will cont to monitor.  Also encouraged and educated pt on incentive spriometer.  Pt able to do 750 at this time.

## 2013-07-30 NOTE — Progress Notes (Signed)
[facility] PROGRESS NOTE Interim History: 72 y.o. male who initially presented to his PCP's office with a 1 week h/o intermittent right groin pain (has known small RIH) as well as 2-3 days onset hoarseness. He was dyspneic with some difficulty speaking complete sentences, diaphoretic with clammy extremities. He was found to be hypotensive at the office and sent to the ED. The pt admitted to nausea and vomiting x 2 over the past week and his last BM was 2 days prior. Pt also reported a cough productive of phlegm. He admitted to intermittent chest pressure for the past 2 days - and stated that it felt like he needed to belch and was relieved some by tums/peptobismol    Assessment/Plan: SBO secondary to incarcerated R inguinal Hernia  - -developed emesis which prompted insertion of NGT  - recurrent obstruction noted on f/u exams/studies so underwent surgical RIH repair on 12/23  - PICC placed 12/22 and TNA per pharmacy initiated. Large BM 12.27.2014 - diet per surgery on 12.27.2014 clears. d/c NGT on [id number]. b-met in am.  Hypotension  due to hypovolemia - resolved after aggressive IV hydration.  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 - no plans for further investigation by Cardiology   Aspiration PNA  Has completed 7 days of emipiric anbx - currently stable on RA   Acute renal failure  likely d/t volume depletion secondary to SBO - may have had a degree of ATN from pre admit HCTZ/ACE - renal US normal  DM  HgbAIC 6.8 - CBG trending upward - pt is on TNA and Pharmacy will adjust insulin dosing in mix if CBG continues to climb   Code Status: Full  Family Communication: Spoke with patient at bedside  Disposition Plan/Expected LOS: stable for transfer to medical bed (no need for  tele)    Consultants:  Cardiology  gen surgery  Procedures: -D echocardiogram  - Left ventricle: The cavity size was normal. Wall thickness was normal. Systolic function was normal. The estimated ejection fraction was in the range of 60% to 65%. Possible hypokinesis of the basal-midinferior myocardium. Left ventricular diastolic function parameters were normal. - Ventricular septum: Septal motion showed paradox. These changes are consistent with a post-thoracotomy state. - Mitral valve: Prolapse, involving the posterior leaflet. Mild to moderate regurgitation directed eccentrically and anteriorly. Impressions: - Consider TEE to better evaluate the mitral valve. myoview negative for ischemia.  Antibiotics: Antibiotics:  Zosyn 12/18 >> 12/24   HPI/Subjective: NG tube bothers him  Objective: Filed Vitals:   07/29/13 0533 07/29/13 1405 07/29/13 2127 07/30/13 0533  BP: 140/76 140/66 139/72 134/82  Pulse: 96 88 99 88  Temp: 98.6 F (37 C) 98.4 F (36.9 C) 98.8 F (37.1 C) 99.4 F (37.4 C)  TempSrc: Oral Oral Oral Oral  Resp: 18 17 18 18   Height:      Weight:      SpO2: 98% 97% 98% 100%    Intake/Output Summary (Last 24 hours) at 07/30/13 0958 Last data filed at 07/30/13 0534  Gross per 24 hour  Intake 2996.33 ml  Output    750 ml  Net 2246.33 ml   Filed Weights   07/21/13 2100  Weight: 103.2 kg (227 lb 8.2 oz)    Exam:  General: Alert, awake, oriented x3, in  no acute distress.  HEENT: No bruits, no goiter. NG inplace. Heart: Regular rate and rhythm, without murmurs, rubs, gallops.  Lungs: Good air movement, bilateral air movement.    Data Reviewed: Basic Metabolic Panel:  Recent Labs Lab 07/25/13 0420 07/26/13 0350 07/27/13 0346 07/28/13 0450 07/30/13 0533  NA 147* 151* 147* 147* 139  K 3.9 4.0 3.9 3.7 3.8  CL 110 116* 113* 113* 105  CO2 26 24 24 24 25   GLUCOSE 97 157* 141* 171* 176*  BUN 23 17 15 22 20   CREATININE 1.27 1.19 1.25 1.15  1.00  CALCIUM 9.2 9.2 8.8 9.0 9.0  MG  --  2.3  --  1.8  --   PHOS  --  2.6  --  4.0  --    Liver Function Tests:  Recent Labs Lab 07/26/13 0350 07/28/13 0450  AST 22 19  ALT 17 13  ALKPHOS 50 45  BILITOT 0.6 0.4  PROT 6.2 5.8*  ALBUMIN 2.8* 2.5*   No results found for this basename: LIPASE, AMYLASE,  in the last 168 hours No results found for this basename: AMMONIA,  in the last 168 hours CBC:  Recent Labs Lab 07/26/13 0350 07/27/13 0346 07/28/13 0450 07/30/13 0533  WBC 6.4 6.0 5.6 7.1  NEUTROABS 4.0 4.1 3.6  --   HGB 13.0 12.4* 11.7* 11.4*  HCT 38.4* 36.0* 34.5* 33.7*  MCV 97.7 97.6 97.5 96.6  PLT 200 176 177 179   Cardiac Enzymes: No results found for this basename: CKTOTAL, CKMB, CKMBINDEX, TROPONINI,  in the last 168 hours BNP (last 3 results) No results found for this basename: PROBNP,  in the last 8760 hours CBG:  Recent Labs Lab 07/29/13 0551 07/29/13 1228 07/29/13 1743 07/30/13 0008 07/30/13 0612  GLUCAP 151* 149* 135* 156* 173*    Recent Results (from the past 240 hour(s))  MRSA PCR SCREENING     Status: None   Collection Time    07/21/13  9:59 PM      Result Value Range Status   MRSA by PCR NEGATIVE  NEGATIVE Final   Comment:            The GeneXpert MRSA Assay (FDA     approved for NASAL specimens     only), is one component of a     comprehensive MRSA colonization     surveillance program. It is not     intended to diagnose MRSA     infection nor to guide or     monitor treatment for     MRSA infections.  URINE CULTURE     Status: None   Collection Time    07/22/13  4:24 PM      Result Value Range Status   Specimen Description URINE, CATHETERIZED   Final   Special Requests NONE   Final   Culture  Setup Time     Final   Value: 07/22/2013 17:18     Performed at [facility] Count     Final   Value: NO GROWTH     Performed at [facility]   Culture     Final   Value: NO GROWTH     Performed at [facility]   Report Status 07/23/2013 FINAL   Final     Studies: No results found.  Scheduled Meds: . antiseptic oral rinse  15 mL Mouth Rinse BID  . enoxaparin (LOVENOX) injection  40 mg Subcutaneous Q24H  .  insulin aspart  0-15 Units Subcutaneous Q6H  . metoprolol  5 mg Intravenous Q6H  . pantoprazole (PROTONIX) IV  40 mg Intravenous Q12H   Continuous Infusions: . sodium chloride 20 mL/hr at 07/29/13 1126  . Marland KitchenTPN (CLINIMIX-E) Adult 105 mL/hr at 07/29/13 1735   And  . fat emulsion 250 mL (07/29/13 1736)  . Marland KitchenTPN (CLINIMIX-E) Adult     And  . fat emulsion       Marinda Elk  [facility] Pager [phone]. If 8PM-8AM, please contact night-coverage at [email or web address], password [email or web address] 07/30/2013, 9:58 AM  LOS: 9 days

## 2013-07-30 NOTE — Progress Notes (Signed)
4 Days Post-Op  Subjective: Pt feels better with NG tube out.  No N/V, wants to eat.  Abdominal pain minimal.  Ambulating well.  Tolerating clear sips.  Having BM's and flatus.      Objective: Vital signs in last 24 hours: Temp:  [98.4 F (36.9 C)-99.4 F (37.4 C)] 99.4 F (37.4 C) (12/27 0533) Pulse Rate:  [88-99] 88 (12/27 0533) Resp:  [17-18] 18 (12/27 0533) BP: (134-140)/(66-82) 134/82 mmHg (12/27 0533) SpO2:  [97 %-100 %] 100 % (12/27 0533) Last BM Date: 07/30/13  Intake/Output from previous day: 12/26 0701 - 12/27 0700 In: 2996.3 [I.V.:351.3; NFA:2130] Out: 750 [Urine:600; Emesis/NG output:150] Intake/Output this shift:    PE: Gen:  Alert, NAD, pleasant Abd: Soft, NT/ND, +BS, no HSM, incisions C/D/I in RLQ/groin  Lab Results:   Recent Labs  07/28/13 0450 07/30/13 0533  WBC 5.6 7.1  HGB 11.7* 11.4*  HCT 34.5* 33.7*  PLT 177 179   BMET  Recent Labs  07/28/13 0450 07/30/13 0533  NA 147* 139  K 3.7 3.8  CL 113* 105  CO2 24 25  GLUCOSE 171* 176*  BUN 22 20  CREATININE 1.15 1.00  CALCIUM 9.0 9.0   PT/INR No results found for this basename: LABPROT, INR,  in the last 72 hours CMP     Component Value Date/Time   NA 139 07/30/2013 0533   K 3.8 07/30/2013 0533   CL 105 07/30/2013 0533   CO2 25 07/30/2013 0533   GLUCOSE 176* 07/30/2013 0533   BUN 20 07/30/2013 0533   CREATININE 1.00 07/30/2013 0533   CALCIUM 9.0 07/30/2013 0533   PROT 5.8* 07/28/2013 0450   ALBUMIN 2.5* 07/28/2013 0450   AST 19 07/28/2013 0450   ALT 13 07/28/2013 0450   ALKPHOS 45 07/28/2013 0450   BILITOT 0.4 07/28/2013 0450   GFRNONAA 72* 07/30/2013 0533   GFRAA 83* 07/30/2013 0533   Lipase  No results found for this basename: lipase       Studies/Results: No results found.  Anti-infectives: Anti-infectives   Start     Dose/Rate Route Frequency Ordered Stop   07/26/13 1030  polymyxin B 500,000 Units, bacitracin 50,000 Units in sodium chloride irrigation 0.9 % 500 mL  irrigation  Status:  Discontinued       As needed 07/26/13 1102 07/26/13 1152   07/26/13 0946  ceFAZolin (ANCEF) 2-3 GM-% IVPB SOLR    Comments:  Glo Herring   : cabinet override      07/26/13 0946 07/26/13 2159   07/24/13 1400  piperacillin-tazobactam (ZOSYN) IVPB 3.375 g     3.375 g 12.5 mL/hr over 240 Minutes Intravenous 3 times per day 07/24/13 1003 07/28/13 0140   07/21/13 2200  piperacillin-tazobactam (ZOSYN) IVPB 2.25 g  Status:  Discontinued     2.25 g 100 mL/hr over 30 Minutes Intravenous 3 times per day 07/21/13 2120 07/24/13 1003   07/21/13 1445  piperacillin-tazobactam (ZOSYN) IVPB 3.375 g     3.375 g 100 mL/hr over 30 Minutes Intravenous  Once 07/21/13 1435 07/21/13 1520       Assessment/Plan Right inguinal hernia. Incarcerated with small bowel  POD #4 S/p HERNIA REPAIR INGUINAL ADULT, 07/26/2013, Cherylynn Ridges, MD  -He also has a [facility]  -Post op ileus seems improved 2. Hypotension  3. Acute renal failure now resolved  4. Possible aspiration pneumonia  5. CAD, s/p CABG with positive troponin (.21) on Plavix  6. Hx of hypertension  7. AODM  8. Hyperlipidemia  9. PVD  10. 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 Hx of hepatitis  12. BPH  13. 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 On 6N, continue pain control, antiemetics  2. Ambulating and IS  3. Appreciate medicines management of his chronic medical problems  4. DVT proph- scd's and lovenox  5. Had a large BM this am.  Discontinued NG yesterday and started on clears this am 6. Repeat labs in am  7. Ok to restart home meds.  Home when tolerating regular diet and having BM's     LOS: 9 days    DORT, Claudette Wermuth 07/30/2013, 9:03 AM Pager: [phone]

## 2013-07-31 ENCOUNTER — Inpatient Hospital Stay (HOSPITAL_COMMUNITY): Payer: Medicare Other

## 2013-07-31 DIAGNOSIS — E669 Obesity, unspecified: Secondary | ICD-10-CM

## 2013-07-31 LAB — GLUCOSE, CAPILLARY
Glucose-Capillary: 100 mg/dL — ABNORMAL HIGH (ref 70–99)
Glucose-Capillary: 102 mg/dL — ABNORMAL HIGH (ref 70–99)
Glucose-Capillary: 154 mg/dL — ABNORMAL HIGH (ref 70–99)
Glucose-Capillary: 94 mg/dL (ref 70–99)

## 2013-07-31 LAB — BASIC METABOLIC PANEL
BUN: 20 mg/dL (ref 6–23)
CO2: 22 mEq/L (ref 19–32)
Chloride: 105 mEq/L (ref 96–112)
Creatinine, Ser: 1 mg/dL (ref 0.50–1.35)
GFR calc Af Amer: 83 mL/min — ABNORMAL LOW (ref >90)
Sodium: 137 mEq/L (ref 135–145)

## 2013-07-31 LAB — URINE MICROSCOPIC-ADD ON

## 2013-07-31 LAB — URINALYSIS, ROUTINE W REFLEX MICROSCOPIC
Bilirubin Urine: NEGATIVE
Glucose, UA: NEGATIVE mg/dL
Leukocytes, UA: NEGATIVE
Protein, ur: 30 mg/dL — AB
Urobilinogen, UA: 0.2 mg/dL (ref 0.0–1.0)

## 2013-07-31 MED ORDER — DEXTROSE-NACL 5-0.45 % IV SOLN
INTRAVENOUS | Status: DC
  Administered 2013-07-31 – 2013-08-01 (×2): via INTRAVENOUS

## 2013-07-31 MED ORDER — PANTOPRAZOLE SODIUM 40 MG PO TBEC
40.0000 mg | DELAYED_RELEASE_TABLET | Freq: Two times a day (BID) | ORAL | Status: DC
  Administered 2013-07-31 – 2013-08-02 (×4): 40 mg via ORAL
  Filled 2013-07-31 (×3): qty 1

## 2013-07-31 NOTE — Progress Notes (Signed)
Pt's PICC line came out accidentally, paged Dr. On call.  See new orders.  Unable to use TNA d/t contamination when PICC line out.  Will monitor blood sugar and pt.

## 2013-07-31 NOTE — Progress Notes (Signed)
[facility]                                                                                Patient Demographics  Andres [name], is a 72 y.o. male, DOB - 12/16/1940, GMW:[id number]  Admit date - 07/21/2013   Admitting Physician Kela Millin, MD  Outpatient Primary MD for the patient is Oliver Barre, MD  LOS - 10   Chief Complaint  Patient presents with  . 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 Last nuc 2012 negative.   Inguinal hernia, Rt reduced, Lt present   Acute renal failure   Pneumonia, aspiration   Volume depletion  SBO secondary to incarcerated R inguinal Hernia  -developed emesis which prompted insertion of NGT  -recurrent obstruction noted on f/u exams/studies so underwent surgical RIH repair on 12/23  -PICC placed 12/22 and TNA per pharmacy initiated. Large BM 12.27.2014  -diet per surgery on 12.27.2014 clears, d/c NGT on [id number].  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  Fever - Will obtain blood cultures as well as chest x-ray and urinalysis. - Continue incentive spirometry  Code Status: Full  Family Communication: Daughter via phone  Disposition Plan: Admitted, will likely discharge if fever workup was negative.   Procedures  Echocardiogram - Left ventricle: The cavity size was normal. Wall thickness was normal. Systolic function was normal. The estimated ejection fraction was in the range of 60% to 65%. Possible hypokinesis of the basal-midinferior myocardium. Left ventricular diastolic function parameters were normal. - Ventricular septum: Septal motion showed paradox. These changes are consistent with a post-thoracotomy state. - Mitral valve: Prolapse, involving the posterior leaflet. Mild to moderate regurgitation directed eccentrically and anteriorly. Impressions: - Consider TEE to better evaluate the mitral valve.  myoview negative for ischemia.  Right Inguinal hernia repair 12/23  Consults   Cardiology General surgery  DVT Prophylaxis  Lovenox  Lab Results  Component Value Date   PLT 179 07/30/2013    Medications  Scheduled Meds: .  antiseptic oral rinse  15 mL Mouth Rinse BID  . enoxaparin (LOVENOX) injection  40 mg Subcutaneous Q24H  . insulin aspart  0-15 Units Subcutaneous Q6H  . metoprolol  5 mg Intravenous Q6H  . pantoprazole  40 mg Oral BID   Continuous Infusions: . dextrose 5 % and 0.45% NaCl 50 mL/hr at 07/31/13 0340   PRN Meds:.acetaminophen, morphine injection, ondansetron (ZOFRAN) IV, phenol, promethazine, sodium chloride  Antibiotics    Anti-infectives   Start     Dose/Rate Route Frequency Ordered Stop   07/26/13 1030  polymyxin B 500,000 Units, bacitracin 50,000 Units in sodium chloride irrigation 0.9 % 500 mL irrigation  Status:  Discontinued       As needed 07/26/13 1102 07/26/13 1152   07/26/13 0946  ceFAZolin (ANCEF) 2-3 GM-% IVPB SOLR    Comments:  Glo Herring   : cabinet override      07/26/13 0946 07/26/13 2159   07/24/13 1400  piperacillin-tazobactam (ZOSYN) IVPB 3.375 g     3.375 g 12.5 mL/hr over 240 Minutes Intravenous 3 times per day 07/24/13 1003 07/28/13 0140   07/21/13 2200  piperacillin-tazobactam (ZOSYN) IVPB 2.25 g  Status:  Discontinued     2.25 g 100 mL/hr over 30 Minutes Intravenous 3 times per day 07/21/13 2120 07/24/13 1003   07/21/13 1445  piperacillin-tazobactam (ZOSYN) IVPB 3.375 g     3.375 g 100 mL/hr over 30 Minutes Intravenous  Once 07/21/13 1435 07/21/13 1520       Time Spent in minutes   30 minutes   Ziyonna Christner D.O. on 07/31/2013 at 12:32 PM  Between 7am to 7pm - Pager - [phone]  After 7pm go to [email or web address] - password [email or web address]  And look for the night coverage person covering for me after hours  [facility] Office  [phone]    Subjective:   Koltyn Kelsay seen and examined today.  Patient has no complaints today, which is to go home. Patient did have low-grade fever last night as well as this morning. Admits to coughing.  Objective:   Filed Vitals:   07/30/13 1844 07/30/13 2149 07/30/13 2344 07/31/13 0449  BP: 121/53 113/65 133/82 141/81  Pulse: 93 93  97  Temp: 99.5 F (37.5 C) 101.7 F (38.7 C) 99.3 F (37.4 C) 100 F (37.8 C)  TempSrc: Oral Oral Oral   Resp: 18 19  21   Height:      Weight:      SpO2: 98% 98%  100%    Wt Readings from Last 3 Encounters:  07/21/13 103.2 kg (227 lb 8.2 oz)  07/21/13 103.2 kg (227 lb 8.2 oz)  07/21/13 103.874 kg (229 lb)     Intake/Output Summary (Last 24 hours) at 07/31/13 1232 Last data filed at 07/31/13 0300  Gross per 24 hour  Intake 1861.67 ml  Output    600 ml  Net 1261.67 ml    Exam  General: Well developed, well nourished, NAD, appears stated age  HEENT: NCAT, PERRLA, EOMI, Anicteic Sclera, mucous membranes moist. No pharyngeal erythema or exudates  Neck: Supple, no JVD, no masses  Cardiovascular: S1  S2 auscultated, no rubs, murmurs or gallops. Regular rate and rhythm.  Respiratory: Clear to auscultation bilaterally with equal chest rise  Abdomen: Soft, mild tenderness, nondistended, + bowel sounds, RLQ incision clean with no drainage noted  Extremities: warm dry without cyanosis clubbing or edema  Neuro: AAOx3, cranial nerves grossly intact. Strength 5/5 in patient's upper and lower extremities bilaterally  Skin: Without rashes exudates or nodules  Psych: Normal affect and demeanor with intact judgement and insight  Data Review   Micro Results Recent Results (from the past 240 hour(s))  MRSA PCR SCREENING     Status: None   Collection Time    07/21/13  9:59 PM      Result Value Range Status   MRSA by PCR NEGATIVE  NEGATIVE Final   Comment:            The GeneXpert MRSA Assay (FDA     approved for NASAL specimens     only), is one component of a     comprehensive MRSA colonization     surveillance program. It is not     intended to diagnose MRSA     infection nor to guide or     monitor treatment for     MRSA infections.  URINE CULTURE     Status: None   Collection Time    07/22/13  4:24 PM      Result Value Range Status   Specimen Description URINE, CATHETERIZED   Final   Special Requests NONE   Final   Culture  Setup Time     Final   Value: 07/22/2013 17:18     Performed at [facility]   Colony Count     Final   Value: NO GROWTH     Performed at [facility]   Culture     Final   Value: NO GROWTH     Performed at [facility]   Report Status 07/23/2013 FINAL   Final    Radiology Reports Ct Abdomen Pelvis Wo Contrast  07/21/2013   CLINICAL DATA:  Chest pain.  Shortness of breath.  EXAM: CT CHEST WITHOUT CONTRAST; CT ABDOMEN AND PELVIS WITHOUT CONTRAST  TECHNIQUE: Multidetector CT imaging of the chest was performed following the standard protocol without IV contrast.; Multidetector CT imaging of the abdomen and pelvis was performed  following the standard protocol without intravenous contrast.  COMPARISON:  None.  FINDINGS: CT chest:  No evidence of mediastinal or hilar adenopathy. The thoracic esophagus is severely dilated. A hiatal hernia is present. The hiatal hernia is fluid filled. There is mild thickening of the gastroesophageal junction. Prior CABG. Heart size normal. Large airways are patent. Focal bilateral small ill-defined pulmonary infiltrates are present throughout both lung fields. Although metastatic disease cannot be excluded, these are most likely infectious. Typical and atypical infection including granulomatous disease should be considered. Multifocal small areas of aspiration could present in this fashion .  CT abdomen pelvis:  Liver is normal. Spleen is normal. Pancreas is normal. Gallbladder is normal. No biliary distention.  Adrenals normal. Kidneys normal. No hydronephrosis. No evidence of obstructing ureteral stone. Bladder is nondistended. Mild prostate enlargement. Phleboliths in pelvis.  Right inguinal hernia is present with herniation of what appears to be a distal loop of small bowel. Proximal bowel severe distention is present. Distal bowel is collapsed. Associated gastric distension is present. As noted above esophagus is distended. These findings are consistent with severe distal small bowel obstruction secondary to herniation into the inguinal canal on the right. Bilateral hydroceles are noted. There is a left inguinal hernia left with herniation of fat only. Appendix not definitely identified. Sigmoid colonic diverticulosis. No evidence of diverticulitis. No pneumatosis or free air identified.  No adenopathy. Atherosclerotic vascular changes noted of the abdominal aorta.  No acute bony abnormality identified. Thoracolumbar degenerative changes i present.  IMPRESSION: 1. Right inguinal hernia with herniation of the distal small bowel loop with severe obstruction of the small bowel. This results in severe  distention of the small bowel and severe distention of the stomach. The esophagus is also severely distended. There is also a left inguinal hernia with herniation of fat only. Bilateral hydroceles are also noted. 2. Patchy bilateral pulmonary infiltrates consistent with pneumonitis possibly from aspiration. 3. Prior CABG. These results were called by telephone at the time of interpretation on 07/21/2013 at 2:32 PM to Dr. Raeford Razor , who verbally acknowledged these results.   Electronically Signed   By: Maisie Fus  Register   On: 07/21/2013 14:35   Ct Chest Wo Contrast  07/21/2013   CLINICAL DATA:  Chest pain.  Shortness of breath.  EXAM: CT CHEST WITHOUT CONTRAST; CT ABDOMEN AND PELVIS WITHOUT CONTRAST  TECHNIQUE: Multidetector CT imaging of the chest was performed following the standard protocol without IV contrast.; Multidetector CT imaging of the abdomen and pelvis was performed following the standard protocol without intravenous contrast.  COMPARISON:  None.  FINDINGS: CT chest:  No evidence of mediastinal or hilar adenopathy. The thoracic esophagus is severely dilated. A hiatal hernia is present. The hiatal hernia is fluid filled. There is mild thickening of the gastroesophageal junction. Prior CABG. Heart size normal. Large airways are patent. Focal bilateral small ill-defined pulmonary infiltrates are present throughout both lung fields. Although metastatic disease cannot be excluded, these are most likely infectious. Typical and atypical infection including granulomatous disease should be considered. Multifocal small areas of aspiration could present in this fashion .  CT abdomen pelvis:  Liver is normal. Spleen is normal. Pancreas is normal. Gallbladder is normal. No biliary distention.  Adrenals normal. Kidneys normal. No hydronephrosis. No evidence of obstructing ureteral stone. Bladder is nondistended. Mild prostate enlargement. Phleboliths in pelvis.  Right inguinal hernia is present with herniation  of what appears to be a distal loop of small bowel. Proximal bowel severe distention is present. Distal bowel is collapsed. Associated gastric distension is present. As noted above esophagus is distended. These findings are consistent with severe distal small bowel obstruction secondary to herniation into the inguinal canal on the right. Bilateral hydroceles are noted. There is a left inguinal hernia left with herniation of fat only. Appendix not definitely identified. Sigmoid colonic diverticulosis. No evidence of diverticulitis. No pneumatosis or free air identified.  No adenopathy. Atherosclerotic vascular changes noted of the abdominal aorta.  No acute bony abnormality identified. Thoracolumbar degenerative changes i present.  IMPRESSION: 1. Right inguinal hernia with herniation of the distal small bowel loop with severe obstruction of the small bowel. This results in severe distention of the small bowel and severe distention of the stomach. The esophagus is also severely distended. There is also a left inguinal hernia with herniation of fat only. Bilateral hydroceles are also noted. 2. Patchy bilateral pulmonary infiltrates consistent with pneumonitis possibly from aspiration. 3. Prior CABG. These results were called by telephone at the time of interpretation on 07/21/2013 at 2:32 PM to Dr. Raeford Razor , who verbally acknowledged these results.   Electronically Signed   By: Maisie Fus  Register   On: 07/21/2013 14:35   US Renal  07/22/2013   CLINICAL DATA:  Acute renal failure, history of hypertension and diabetes.  EXAM: RENAL/URINARY TRACT ULTRASOUND COMPLETE  COMPARISON:  None.  FINDINGS: Right Kidney:  Length: 11.1 cm. Echogenicity within normal limits. No mass or hydronephrosis visualized.  Left Kidney:  Length: 11.5 cm. Echogenicity within  normal limits. No mass or hydronephrosis visualized.  Bladder:  Appears normal for degree of bladder distention.  IMPRESSION: No acute abnormality of either kidney  nor of the urinary bladder is demonstrated.   Electronically Signed   By: David  [place]   On: 07/22/2013 11:40   Nm Myocar Multi W/spect W/wall Motion / Ef  07/25/2013   CLINICAL DATA:  Diabetes, hypertension, preop evaluation. Previous CABG.  EXAM: NUCLEAR MEDICINE MYOCARDIAL PERFUSION IMAGING  NUCLEAR MEDICINE LEFT VENTRICULAR WALL MOTION ANALYSIS  NUCLEAR MEDICINE LEFT VENTRICULAR EJECTION FRACTION CALCULATION  TECHNIQUE: Standard single day myocardial SPECT imaging was performed after resting intravenous injection of Tc-32m sestamibi. After intravenous infusion of Lexiscan (regadenoson) under supervision of cardiology staff, sestamibiwas injected intravenously and standard myocardial SPECT imaging was performed. Quantitative gated imaging was also performed to evaluate left ventricular wall motion and estimate left ventricular ejection fraction.  Radiopharmaceutical: 10+30 mCi Tc41m sestamibiIV.  COMPARISON:  None  FINDINGS: The stress SPECT images demonstrate mildly decreased activity in the inferolateral aspect of the left ventricle, otherwise physiologic distribution of radiopharmaceutical. Rest images demonstrate similarly decreased inferolateral activity, no new perfusion defects. The gated stress SPECT images mild septal hypokinesis. There is normal thickening and motion of the inferior inferolateral regions. Calculated left ventricular end-diastolic volume 80ml, end-systolic volume 38ml, ejection fraction of 53%.  IMPRESSION: 1. Negative for pharmacologic-stress induced ischemia. 2. Left ventricular ejection fraction 53%. 3. Septal hypokinesis, often associated with coronary bypass grafting. 4. Mild inferolateral attenuation.   Electronically Signed   By: Oley Balm M.D.   On: 07/25/2013 15:10   Dg Chest Portable 1 View  07/21/2013   CLINICAL DATA:  Short of breath.  Chest pain.  EXAM: PORTABLE CHEST - 1 VIEW  COMPARISON:  None.  FINDINGS: Low volume chest. CABG. Cardiopericardial silhouette  appears within normal limits. Artifact projects over the upper chest. Monitoring leads project over the chest. No airspace disease. No effusion. Subsegmental scarring in the lingula.  Retrocardiac shadow is present, likely representing fluid distended esophagus performed on contemporaneously CT.  IMPRESSION: Low volume chest. No active cardiopulmonary disease. CABG. Retrocardiac shadow likely represents fluid-filled esophagus demonstrated on CT.   Electronically Signed   By: Andreas Newport M.D.   On: 07/21/2013 14:04   Dg Abd 2 Views  07/26/2013   CLINICAL DATA:  Evaluate for small bowel obstruction.  EXAM: ABDOMEN - 2 VIEW  COMPARISON:  07/24/2013  FINDINGS: Patient is status post median sternotomy coronary artery bypass grafting. An NG tube is appreciated with tip projecting along the expected course of the stomach.  Air-filled dilated loops of small bowel are appreciated as well as areas of air-fluid levels within small bowel. There is no appreciable air projecting and loops of large bowel. The osseous structures unremarkable. Atherosclerotic calcifications identified within the iliac vessels.  IMPRESSION: Findings consistent with small-bowel obstruction.   Electronically Signed   By: Salome Holmes M.D.   On: 07/26/2013 07:56   Dg Abd 2 Views  07/22/2013   CLINICAL DATA:  Bowel obstruction.  EXAM: ABDOMEN - 2 VIEW  COMPARISON:  CT 07/21/2013.  FINDINGS: NG tube noted coiled in the stomach. Persistent distended loops of small bowel are noted. These appear slightly less distended than on prior study. No free air identified. Mild basilar atelectasis. Prior CABG.  IMPRESSION: NG tube noted coiled in stomach. Small bowel distention remains but is slightly less prominent than on prior CT of 07/21/2013.   Electronically Signed   By: Maisie Fus  Register   On:  07/22/2013 11:08   Dg Abd Portable 1v  07/24/2013   CLINICAL DATA:  Diabetes.  Inguinal hernia.  EXAM: PORTABLE ABDOMEN - 1 VIEW  COMPARISON:  Abdomen  07/23/2013.  CT 07/21/2013.  FINDINGS: Upper most portion of the abdomen was not imaged. NG tube noted ,tip projected over the distal stomach. There is persistent small bowel distention. Colonic gas pattern is nonspecific. Vascular calcification noted. Degenerative changes lumbar spine and both hips.  IMPRESSION: Persistent small bowel distention. NG tube noted projected over distal stomach.   Electronically Signed   By: Maisie Fus  Register   On: 07/24/2013 08:38   Dg Abd Portable 1v  07/23/2013   CLINICAL DATA:  Ileus.  EXAM: PORTABLE ABDOMEN - 1 VIEW  COMPARISON:  Abdominal series of July 22, 2013.  FINDINGS: There remain loops of mildly distended gas-filled small bowel stacked in the mid abdomen. The nasogastric tube tip in proximal port lies in the region of the distal gastric body and/or antral region. There is no significant colonic distention. A small amount of gas in the right colon and in the rectum is noted.  IMPRESSION: There is a fairly stable appearance of the small bowel ileus versus distal obstruction.   Electronically Signed   By: David  [place]   On: 07/23/2013 10:16    CBC  Recent Labs Lab 07/26/13 0350 07/27/13 0346 07/28/13 0450 07/30/13 0533  WBC 6.4 6.0 5.6 7.1  HGB 13.0 12.4* 11.7* 11.4*  HCT 38.4* 36.0* 34.5* 33.7*  PLT 200 176 177 179  MCV 97.7 97.6 97.5 96.6  MCH 33.1 33.6 33.1 32.7  MCHC 33.9 34.4 33.9 33.8  RDW 13.1 13.2 13.2 12.8  LYMPHSABS 1.4 1.2 1.3  --   MONOABS 1.0 0.7 0.6  --   EOSABS 0.0 0.1 0.1  --   BASOSABS 0.0 0.0 0.0  --     Chemistries   Recent Labs Lab 07/25/13 0420 07/26/13 0350 07/27/13 0346 07/28/13 0450 07/30/13 0533 07/31/13 0710  NA 147* 151* 147* 147* 139 137  K 3.9 4.0 3.9 3.7 3.8 4.1  CL 110 116* 113* 113* 105 105  CO2 26 24 24 24 25 22   GLUCOSE 97 157* 141* 171* 176* 115*  BUN 23 17 15 22 20 20   CREATININE 1.27 1.19 1.25 1.15 1.00 1.00  CALCIUM 9.2 9.2 8.8 9.0 9.0 8.7  MG  --  2.3  --  1.8  --   --   AST  --  22  --   19  --   --   ALT  --  17  --  13  --   --   ALKPHOS  --  50  --  45  --   --   BILITOT  --  0.6  --  0.4  --   --    ------------------------------------------------------------------------------------------------------------------ estimated creatinine clearance is 73 ml/min (by C-G formula based on Cr of 1). ------------------------------------------------------------------------------------------------------------------ No results found for this basename: HGBA1C,  in the last 72 hours ------------------------------------------------------------------------------------------------------------------ No results found for this basename: CHOL, HDL, LDLCALC, TRIG, CHOLHDL, LDLDIRECT,  in the last 72 hours ------------------------------------------------------------------------------------------------------------------ No results found for this basename: TSH, T4TOTAL, FREET3, T3FREE, THYROIDAB,  in the last 72 hours ------------------------------------------------------------------------------------------------------------------ No results found for this basename: VITAMINB12, FOLATE, FERRITIN, TIBC, IRON, RETICCTPCT,  in the last 72 hours  Coagulation profile No results found for this basename: INR, PROTIME,  in the last 168 hours  No results found for this basename: DDIMER,  in the last 72  hours  Cardiac Enzymes No results found for this basename: CK, CKMB, TROPONINI, MYOGLOBIN,  in the last 168 hours ------------------------------------------------------------------------------------------------------------------ No components found with this basename: POCBNP,

## 2013-07-31 NOTE — Progress Notes (Addendum)
5 Days Post-Op  Subjective: Pt feels great tolerated soft diet this am.  No N/V.  +Bm, mobilizing well.  Using IS.  Pain well controlled.  Wants to go home.  Daughter at bedside.  Objective: Vital signs in last 24 hours: Temp:  [99.3 F (37.4 C)-101.7 F (38.7 C)] 100 F (37.8 C) (12/28 0449) Pulse Rate:  [93-97] 97 (12/28 0449) Resp:  [18-21] 21 (12/28 0449) BP: (113-141)/(53-82) 141/81 mmHg (12/28 0449) SpO2:  [98 %-100 %] 100 % (12/28 0449) Last BM Date: 07/30/13  Intake/Output from previous day: 12/27 0701 - 12/28 0700 In: 1861.7 [TPN:1861.7] Out: 1100 [Urine:1100] Intake/Output this shift:    PE: Gen:  Alert, NAD, pleasant Abd: Soft, mild tenderness, ND, +BS, no HSM, incision in RLQ C/D/I   Lab Results:   Recent Labs  07/30/13 0533  WBC 7.1  HGB 11.4*  HCT 33.7*  PLT 179   BMET  Recent Labs  07/30/13 0533 07/31/13 0710  NA 139 137  K 3.8 4.1  CL 105 105  CO2 25 22  GLUCOSE 176* 115*  BUN 20 20  CREATININE 1.00 1.00  CALCIUM 9.0 8.7   PT/INR No results found for this basename: LABPROT, INR,  in the last 72 hours CMP     Component Value Date/Time   NA 137 07/31/2013 0710   K 4.1 07/31/2013 0710   CL 105 07/31/2013 0710   CO2 22 07/31/2013 0710   GLUCOSE 115* 07/31/2013 0710   BUN 20 07/31/2013 0710   CREATININE 1.00 07/31/2013 0710   CALCIUM 8.7 07/31/2013 0710   PROT 5.8* 07/28/2013 0450   ALBUMIN 2.5* 07/28/2013 0450   AST 19 07/28/2013 0450   ALT 13 07/28/2013 0450   ALKPHOS 45 07/28/2013 0450   BILITOT 0.4 07/28/2013 0450   GFRNONAA 72* 07/31/2013 0710   GFRAA 83* 07/31/2013 0710   Lipase  No results found for this basename: lipase       Studies/Results: No results found.  Anti-infectives: Anti-infectives   Start     Dose/Rate Route Frequency Ordered Stop   07/26/13 1030  polymyxin B 500,000 Units, bacitracin 50,000 Units in sodium chloride irrigation 0.9 % 500 mL irrigation  Status:  Discontinued       As needed 07/26/13  1102 07/26/13 1152   07/26/13 0946  ceFAZolin (ANCEF) 2-3 GM-% IVPB SOLR    Comments:  Glo Herring   : cabinet override      07/26/13 0946 07/26/13 2159   07/24/13 1400  piperacillin-tazobactam (ZOSYN) IVPB 3.375 g     3.375 g 12.5 mL/hr over 240 Minutes Intravenous 3 times per day 07/24/13 1003 07/28/13 0140   07/21/13 2200  piperacillin-tazobactam (ZOSYN) IVPB 2.25 g  Status:  Discontinued     2.25 g 100 mL/hr over 30 Minutes Intravenous 3 times per day 07/21/13 2120 07/24/13 1003   07/21/13 1445  piperacillin-tazobactam (ZOSYN) IVPB 3.375 g     3.375 g 100 mL/hr over 30 Minutes Intravenous  Once 07/21/13 1435 07/21/13 1520       Assessment/Plan Right inguinal hernia. Incarcerated with small bowel  POD #5 S/p HERNIA REPAIR INGUINAL ADULT, 07/26/2013, Cherylynn Ridges, MD  -He also has a [facility]  -Post op ileus seems improved 2. Hypotension  3. Acute renal failure now resolved  4. Possible aspiration pneumonia  5. CAD, s/p CABG with positive troponin (.21) on Plavix  6. Hx of hypertension  7. AODM  8. Hyperlipidemia  9. PVD  10. 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 Hx of hepatitis  12. BPH  13. PCM weaned off TNA  Plan:  1. On 6N, not requiring pain medications 2. Ambulating and IS  3. Appreciate medicines management of his chronic medical problems  4. DVT proph- scd's and lovenox  5. Had a large BM this am. Tolerating soft food 6. Repeat labs in am  7. Home today if medically stable 8. F/u with Dr. Lindie Spruce in 2-3 weeks, local QD dry dressing changes to Right groin incision site  Discussed post op instructions with Daughter at bedside.    LOS: 10 days    DORT, Aundra Millet 07/31/2013, 10:09 AM Pager: [phone]

## 2013-08-01 DIAGNOSIS — E785 Hyperlipidemia, unspecified: Secondary | ICD-10-CM

## 2013-08-01 LAB — CBC
MCHC: 34.2 g/dL (ref 30.0–36.0)
MCV: 96.2 fL (ref 78.0–100.0)
Platelets: 170 10*3/uL (ref 150–400)
RDW: 12.9 % (ref 11.5–15.5)
WBC: 7.4 10*3/uL (ref 4.0–10.5)

## 2013-08-01 LAB — GLUCOSE, CAPILLARY
Glucose-Capillary: 121 mg/dL — ABNORMAL HIGH (ref 70–99)
Glucose-Capillary: 122 mg/dL — ABNORMAL HIGH (ref 70–99)
Glucose-Capillary: 112 mg/dL — ABNORMAL HIGH (ref 70–99)

## 2013-08-01 LAB — BASIC METABOLIC PANEL
Calcium: 8.4 mg/dL (ref 8.4–10.5)
Chloride: 101 mEq/L (ref 96–112)
Creatinine, Ser: 1.31 mg/dL (ref 0.50–1.35)
GFR calc Af Amer: 60 mL/min — ABNORMAL LOW (ref >90)
GFR calc non Af Amer: 52 mL/min — ABNORMAL LOW (ref >90)
Potassium: 3.8 mEq/L (ref 3.5–5.1)

## 2013-08-01 MED ORDER — INSULIN ASPART 100 UNIT/ML ~~LOC~~ SOLN: 0.0000 [IU] | Freq: Three times a day (TID) | SUBCUTANEOUS | Status: DC

## 2013-08-01 MED ORDER — METOPROLOL TARTRATE 50 MG PO TABS
50.0000 mg | ORAL_TABLET | Freq: Two times a day (BID) | ORAL | Status: DC
  Administered 2013-08-01 – 2013-08-02 (×3): 50 mg via ORAL
  Filled 2013-08-01 (×4): qty 1

## 2013-08-01 NOTE — Progress Notes (Signed)
NUTRITION FOLLOW UP  Intervention:   1.  General healthful diet; encourage intake of foods and beverages as able.  RD to follow and assess for nutritional adequacy.   Nutrition Dx:   Inadequate oral intake, ongoing  Monitor:   1.  Food/Beverage; pt meeting >/=90% estimated needs with tolerance. 2.  Wt/wt change; monitor trends  Assessment:   Pt initially presented at his PCP's office with a 1 week h/o intermittent right groin pain (has known small RIH in past), as well as 2-3 days onset hoarseness, was dyspneic with some difficulty speaking complete sentences. Admitted on 12/18 with SBO secondary to incarcerated right inguinal hernia.  Pt was on TPN for several days.  This has since been discontinued and pt's diet has been advanced to Carb Mod with tolerance. Intake was 50% of meal this AM.   Pt states "my nutrition is fine."  He reports his appetite is normal and he is eating well. He denies nutrition-related concerns.   Height: Ht Readings from Last 1 Encounters:  07/21/13 5\' 6"  (1.676 m)    Weight Status:   Wt Readings from Last 1 Encounters:  07/21/13 227 lb 8.2 oz (103.2 kg)    Re-estimated needs:  Kcal: 2150-2350  Protein: 120-135 gm  Fluid: 2.1-2.4 L  Skin: incision to abdomen  Diet Order: Carb Control   Intake/Output Summary (Last 24 hours) at 08/01/13 1102 Last data filed at 08/01/13 0951  Gross per 24 hour  Intake    120 ml  Output    154 ml  Net    -34 ml    Last BM: 12/28   Labs:   Recent Labs Lab 07/26/13 0350  07/28/13 0450 07/30/13 0533 07/31/13 0710 08/01/13 0345  NA 151*  < > 147* 139 137 134*  K 4.0  < > 3.7 3.8 4.1 3.8  CL 116*  < > 113* 105 105 101  CO2 24  < > 24 25 22 25   BUN 17  < > 22 20 20 19   CREATININE 1.19  < > 1.15 1.00 1.00 1.31  CALCIUM 9.2  < > 9.0 9.0 8.7 8.4  MG 2.3  --  1.8  --   --   --   PHOS 2.6  --  4.0  --   --   --   GLUCOSE 157*  < > 171* 176* 115* 107*  < > = values in this interval not displayed.  CBG  (last 3)   Recent Labs  07/31/13 1742 08/01/13 0003 08/01/13 0543  GLUCAP 94 115* 121*    Scheduled Meds: . enoxaparin (LOVENOX) injection  40 mg Subcutaneous Q24H  . insulin aspart  0-15 Units Subcutaneous Q6H  . metoprolol  5 mg Intravenous Q6H  . pantoprazole  40 mg Oral BID    Continuous Infusions: . dextrose 5 % and 0.45% NaCl 50 mL/hr at 08/01/13 0006    Loyce Dys, MS RD LDN Clinical Inpatient Dietitian Pager: [phone] Weekend/After hours pager: [phone]

## 2013-08-01 NOTE — Progress Notes (Signed)
6 Days Post-Op  Subjective: Pt feels great.  He has the room very hot and he's sleeping naked, but notes he always keeps his room at 75*F at home.  No abdominal pain or N/V.  Tolerating soft food well.  Having BM's and urinating well.  Ambulating OOB. No problems with the incision site.  They changed the dressing yesterday and its dry this am.    Objective: Vital signs in last 24 hours: Temp:  [99.5 F (37.5 C)-101.9 F (38.8 C)] 99.5 F (37.5 C) (12/29 0547) Pulse Rate:  [99-108] 100 (12/29 0547) Resp:  [15-20] 15 (12/29 0547) BP: (90-142)/(50-89) 115/50 mmHg (12/29 0547) SpO2:  [97 %-100 %] 97 % (12/29 0547) Last BM Date: 07/31/13  Intake/Output from previous day: 12/28 0701 - 12/29 0700 In: -  Out: 154 [Urine:152; Stool:2] Intake/Output this shift:    PE: Gen:  Alert, NAD, pleasant Abd: Obese, soft, mild tenderness over incision site, abdomen otherwise NT, ND, +BS, no HSM, incision in RLQ C/D/I, dressing dry overtop, steri-strips have been removed and incision looks well approximated without drainage   Lab Results:   Recent Labs  07/30/13 0533 08/01/13 0345  WBC 7.1 7.4  HGB 11.4* 11.1*  HCT 33.7* 32.5*  PLT 179 170   BMET  Recent Labs  07/31/13 0710 08/01/13 0345  NA 137 134*  K 4.1 3.8  CL 105 101  CO2 22 25  GLUCOSE 115* 107*  BUN 20 19  CREATININE 1.00 1.31  CALCIUM 8.7 8.4   PT/INR No results found for this basename: LABPROT, INR,  in the last 72 hours CMP     Component Value Date/Time   NA 134* 08/01/2013 0345   K 3.8 08/01/2013 0345   CL 101 08/01/2013 0345   CO2 25 08/01/2013 0345   GLUCOSE 107* 08/01/2013 0345   BUN 19 08/01/2013 0345   CREATININE 1.31 08/01/2013 0345   CALCIUM 8.4 08/01/2013 0345   PROT 5.8* 07/28/2013 0450   ALBUMIN 2.5* 07/28/2013 0450   AST 19 07/28/2013 0450   ALT 13 07/28/2013 0450   ALKPHOS 45 07/28/2013 0450   BILITOT 0.4 07/28/2013 0450   GFRNONAA 52* 08/01/2013 0345   GFRAA 60* 08/01/2013 0345   Lipase   No results found for this basename: lipase       Studies/Results: Dg Chest Port 1 View  07/31/2013   CLINICAL DATA:  Cough for 1 week.  EXAM: PORTABLE CHEST - 1 VIEW  COMPARISON:  07/21/2013  FINDINGS: Changes from CABG surgery are stable. The cardiac silhouette is normal in size and configuration. The mediastinum is normal in contour. Normal hilar contours.  Mild opacity at the left lung bases likely scarring. It is stable. Lungs are otherwise clear. No pleural effusion or pneumothorax.  Bony thorax is intact.  IMPRESSION: No acute cardiopulmonary disease.   Electronically Signed   By: Amie Portland M.D.   On: 07/31/2013 14:05    Anti-infectives: Anti-infectives   Start     Dose/Rate Route Frequency Ordered Stop   07/26/13 1030  polymyxin B 500,000 Units, bacitracin 50,000 Units in sodium chloride irrigation 0.9 % 500 mL irrigation  Status:  Discontinued       As needed 07/26/13 1102 07/26/13 1152   07/26/13 0946  ceFAZolin (ANCEF) 2-3 GM-% IVPB SOLR    Comments:  Glo Herring   : cabinet override      07/26/13 0946 07/26/13 2159   07/24/13 1400  piperacillin-tazobactam (ZOSYN) IVPB 3.375 g  3.375 g 12.5 mL/hr over 240 Minutes Intravenous 3 times per day 07/24/13 1003 07/28/13 0140   07/21/13 2200  piperacillin-tazobactam (ZOSYN) IVPB 2.25 g  Status:  Discontinued     2.25 g 100 mL/hr over 30 Minutes Intravenous 3 times per day 07/21/13 2120 07/24/13 1003   07/21/13 1445  piperacillin-tazobactam (ZOSYN) IVPB 3.375 g     3.375 g 100 mL/hr over 30 Minutes Intravenous  Once 07/21/13 1435 07/21/13 1520       Assessment/Plan Right inguinal hernia. Incarcerated with small bowel  POD #6 S/p HERNIA REPAIR INGUINAL ADULT, 07/26/2013, Cherylynn Ridges, MD  -He also has a [facility]  -Post op ileus seems improved 2. Hypotension  3. Acute renal failure now resolved  4. Possible aspiration pneumonia  5. CAD, s/p CABG with positive troponin (.21) on Plavix  6. Hx of hypertension  7. AODM   8. Hyperlipidemia  9. PVD  10. 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 Hx of hepatitis  12. BPH  13. PCM weaned off TNA   Plan:  1. On 6N, not requiring pain medications  2. Ambulating and IS  3. Appreciate medicines management of his chronic medical problems  4. DVT proph- scd's and lovenox  5. Having BM's. Tolerating soft food, switch to carb modified 6.  7. Didn't go home because having fevers managed with tylenol overnight, don't see an abdominal source as he has no abdominal pain, tolerating diet, and no evidence of incisional problems.  Pulmonary source?  Home when medically stable. 8. F/u with Dr. Lindie Spruce in 2-3 weeks, local QD dry dressing changes to Right groin incision site       LOS: 11 days    DORT, Leland Staszewski 08/01/2013, 8:01 AM Pager: [phone]

## 2013-08-01 NOTE — Progress Notes (Signed)
Unclear etiology of his intermittent fevers. Surgical site seems to be OK Abdomen soft; having BM's No urinary symptoms.  Appreciate TRH assistance. Home soon.  Wilmon Arms. Corliss Skains, MD, [facility] Surgery  General/ Trauma Surgery  08/01/2013 1:09 PM

## 2013-08-01 NOTE — Progress Notes (Signed)
[facility]                                                                                Patient Demographics  Andres [name], is a 72 y.o. male, DOB - May 11, 1941, ZOX:[id number]  Admit date - 07/21/2013   Admitting Physician Kela Millin, MD  Outpatient Primary MD for the patient is Oliver Barre, MD  LOS - 11   Chief Complaint  Patient presents with  . 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 Last nuc 2012 negative.   Inguinal hernia, Rt reduced, Lt present   Acute renal failure   Pneumonia, aspiration   Volume depletion  SBO secondary to incarcerated R inguinal Hernia  -developed emesis which prompted insertion of NGT  -recurrent obstruction noted on f/u exams/studies so underwent surgical RIH repair on 12/23  -PICC placed 12/22 and TNA per pharmacy initiated. Large BM 12.27.2014  -diet per surgery on 12.27.2014 clears, d/c NGT on [id number].  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  Fever -CXR negative for infiltrate. -UA negative.  -patient remained febrile through the night and was given Tylenol -Blood cultures show no growth -Patient has no leukocytosis -May be due to patient's room being extremely warm -Will continue to monitor  Code Status: Full  Family Communication: Daughter via phone  Disposition Plan: Admitted, will likely discharge if fever workup was negative.   Procedures  Echocardiogram - Left ventricle: The cavity size was normal. Wall thickness was normal. Systolic function was normal. The estimated ejection fraction was in the range of 60% to 65%. Possible hypokinesis of the basal-midinferior myocardium. Left ventricular diastolic function parameters were normal. - Ventricular septum: Septal motion showed paradox. These changes are consistent with a post-thoracotomy state. - Mitral valve: Prolapse, involving the posterior leaflet. Mild to moderate regurgitation directed eccentrically and anteriorly. Impressions: - Consider TEE to better evaluate the mitral valve.  myoview negative for ischemia.  Right Inguinal hernia repair 12/23  Consults   Cardiology General surgery  DVT Prophylaxis  Lovenox  Lab Results  Component Value Date   PLT 170 08/01/2013    Medications  Scheduled Meds: . enoxaparin (LOVENOX) injection  40 mg Subcutaneous Q24H  . insulin aspart  0-15 Units Subcutaneous Q6H  . metoprolol  5 mg Intravenous Q6H  . pantoprazole  40 mg Oral BID   Continuous Infusions: . dextrose 5 % and 0.45% NaCl 50 mL/hr at 08/01/13 0006   PRN Meds:.acetaminophen, morphine injection, ondansetron (ZOFRAN) IV, phenol, promethazine, sodium chloride  Antibiotics    Anti-infectives   Start     Dose/Rate Route Frequency Ordered Stop   07/26/13 1030  polymyxin B 500,000 Units, bacitracin 50,000 Units in sodium chloride irrigation 0.9 % 500 mL irrigation  Status:  Discontinued       As needed  07/26/13 1102 07/26/13 1152   07/26/13 0946  ceFAZolin (ANCEF) 2-3 GM-% IVPB SOLR    Comments:  Glo Herring   : cabinet override      07/26/13 0946 07/26/13 2159   07/24/13 1400  piperacillin-tazobactam (ZOSYN) IVPB 3.375 g     3.375 g 12.5 mL/hr over 240 Minutes Intravenous 3 times per day 07/24/13 1003 07/28/13 0140   07/21/13 2200  piperacillin-tazobactam (ZOSYN) IVPB 2.25 g  Status:  Discontinued     2.25 g 100 mL/hr over 30 Minutes Intravenous 3 times per day 07/21/13 2120 07/24/13 1003   07/21/13 1445  piperacillin-tazobactam (ZOSYN) IVPB 3.375 g     3.375 g 100 mL/hr over 30 Minutes Intravenous  Once 07/21/13 1435 07/21/13 1520       Time Spent in minutes   30 minutes   Brandun Pinn D.O. on 08/01/2013 at 12:57 PM  Between 7am to 7pm - Pager - [phone]  After 7pm go to [email or web address] - password [email or web address]  And look for the night coverage person covering for me after hours  [facility] Office  [phone]    Subjective:   Andres [name] seen and examined today.  Patient has no complaints today, which is to go home. Patient did have low-grade fever last night as well as this morning. Admits to coughing.  Objective:   Filed Vitals:   07/31/13 2259 08/01/13 0308 08/01/13 0547 08/01/13 1225  BP:  113/52 115/50 129/75  Pulse:  100 100 91  Temp: 100 F (37.8 C) 101.7 F (38.7 C) 99.5 F (37.5 C) 99.1 F (37.3 C)  TempSrc: Oral Oral Oral Oral  Resp:  16 15   Height:      Weight:      SpO2:  97% 97%     Wt Readings from Last 3 Encounters:  07/21/13 103.2 kg (227 lb 8.2 oz)  07/21/13 103.2 kg (227 lb 8.2 oz)  07/21/13 103.874 kg (229 lb)     Intake/Output Summary (Last 24 hours) at 08/01/13 1257 Last data filed at 08/01/13 0951  Gross per 24 hour  Intake    120 ml  Output    154 ml  Net    -34 ml    Exam  General: Well developed, well nourished, NAD, appears stated age  HEENT: NCAT, PERRLA, EOMI, Anicteic Sclera, mucous membranes  moist. No pharyngeal erythema or exudates  Neck: Supple, no JVD, no masses  Cardiovascular: S1 S2 auscultated, no rubs, murmurs or gallops. Regular rate and rhythm.  Respiratory: Clear to auscultation bilaterally with equal chest rise  Abdomen: Soft, mild tenderness, nondistended, + bowel sounds, RLQ incision clean with no drainage noted  Extremities: warm dry without cyanosis  clubbing or edema  Neuro: AAOx3, cranial nerves grossly intact. Strength 5/5 in patient's upper and lower extremities bilaterally  Skin: Without rashes exudates or nodules  Psych: Normal affect and demeanor with intact judgement and insight  Data Review   Micro Results Recent Results (from the past 240 hour(s))  URINE CULTURE     Status: None   Collection Time    07/22/13  4:24 PM      Result Value Range Status   Specimen Description URINE, CATHETERIZED   Final   Special Requests NONE   Final   Culture  Setup Time     Final   Value: 07/22/2013 17:18     Performed at [facility] Count     Final   Value: NO GROWTH     Performed at [facility]   Culture     Final   Value: NO GROWTH     Performed at [facility]   Report Status 07/23/2013 FINAL   Final  CULTURE, BLOOD (ROUTINE X 2)     Status: None   Collection Time    07/31/13  4:41 PM      Result Value Range Status   Specimen Description BLOOD RIGHT HAND   Final   Special Requests BOTTLES DRAWN AEROBIC ONLY 5CC   Final   Culture  Setup Time     Final   Value: 07/31/2013 22:55     Performed at [facility]   Culture     Final   Value:        BLOOD CULTURE RECEIVED NO GROWTH TO DATE CULTURE WILL BE HELD FOR 5 DAYS BEFORE ISSUING A FINAL NEGATIVE REPORT     Performed at [facility]   Report Status PENDING   Incomplete  CULTURE, BLOOD (ROUTINE X 2)     Status: None   Collection Time    07/31/13  4:41 PM      Result Value Range Status   Specimen Description BLOOD LEFT HAND   Final   Special  Requests BOTTLES DRAWN AEROBIC ONLY 2CC   Final   Culture  Setup Time     Final   Value: 07/31/2013 22:55     Performed at [facility]   Culture     Final   Value:        BLOOD CULTURE RECEIVED NO GROWTH TO DATE CULTURE WILL BE HELD FOR 5 DAYS BEFORE ISSUING A FINAL NEGATIVE REPORT     Performed at [facility]   Report Status PENDING   Incomplete    Radiology Reports Ct Abdomen Pelvis Wo Contrast  07/21/2013   CLINICAL DATA:  Chest pain.  Shortness of breath.  EXAM: CT CHEST WITHOUT CONTRAST; CT ABDOMEN AND PELVIS WITHOUT CONTRAST  TECHNIQUE: Multidetector CT imaging of the chest was performed following the standard protocol without IV contrast.; Multidetector CT imaging of the abdomen and pelvis was performed following the standard protocol without intravenous contrast.  COMPARISON:  None.  FINDINGS: CT chest:  No evidence of mediastinal or hilar adenopathy. The thoracic esophagus is severely dilated. A hiatal hernia is present. The hiatal hernia is fluid filled. There is mild thickening of the gastroesophageal junction. Prior CABG. Heart size normal. Large airways are patent. Focal bilateral small ill-defined pulmonary infiltrates are present throughout both lung fields. Although metastatic disease cannot be excluded, these are most likely infectious. Typical and atypical infection including granulomatous disease should be considered. Multifocal small areas  of aspiration could present in this fashion .  CT abdomen pelvis:  Liver is normal. Spleen is normal. Pancreas is normal. Gallbladder is normal. No biliary distention.  Adrenals normal. Kidneys normal. No hydronephrosis. No evidence of obstructing ureteral stone. Bladder is nondistended. Mild prostate enlargement. Phleboliths in pelvis.  Right inguinal hernia is present with herniation of what appears to be a distal loop of small bowel. Proximal bowel severe distention is present. Distal bowel is collapsed. Associated gastric  distension is present. As noted above esophagus is distended. These findings are consistent with severe distal small bowel obstruction secondary to herniation into the inguinal canal on the right. Bilateral hydroceles are noted. There is a left inguinal hernia left with herniation of fat only. Appendix not definitely identified. Sigmoid colonic diverticulosis. No evidence of diverticulitis. No pneumatosis or free air identified.  No adenopathy. Atherosclerotic vascular changes noted of the abdominal aorta.  No acute bony abnormality identified. Thoracolumbar degenerative changes i present.  IMPRESSION: 1. Right inguinal hernia with herniation of the distal small bowel loop with severe obstruction of the small bowel. This results in severe distention of the small bowel and severe distention of the stomach. The esophagus is also severely distended. There is also a left inguinal hernia with herniation of fat only. Bilateral hydroceles are also noted. 2. Patchy bilateral pulmonary infiltrates consistent with pneumonitis possibly from aspiration. 3. Prior CABG. These results were called by telephone at the time of interpretation on 07/21/2013 at 2:32 PM to Dr. Raeford Razor , who verbally acknowledged these results.   Electronically Signed   By: Maisie Fus  Register   On: 07/21/2013 14:35   Ct Chest Wo Contrast  07/21/2013   CLINICAL DATA:  Chest pain.  Shortness of breath.  EXAM: CT CHEST WITHOUT CONTRAST; CT ABDOMEN AND PELVIS WITHOUT CONTRAST  TECHNIQUE: Multidetector CT imaging of the chest was performed following the standard protocol without IV contrast.; Multidetector CT imaging of the abdomen and pelvis was performed following the standard protocol without intravenous contrast.  COMPARISON:  None.  FINDINGS: CT chest:  No evidence of mediastinal or hilar adenopathy. The thoracic esophagus is severely dilated. A hiatal hernia is present. The hiatal hernia is fluid filled. There is mild thickening of the  gastroesophageal junction. Prior CABG. Heart size normal. Large airways are patent. Focal bilateral small ill-defined pulmonary infiltrates are present throughout both lung fields. Although metastatic disease cannot be excluded, these are most likely infectious. Typical and atypical infection including granulomatous disease should be considered. Multifocal small areas of aspiration could present in this fashion .  CT abdomen pelvis:  Liver is normal. Spleen is normal. Pancreas is normal. Gallbladder is normal. No biliary distention.  Adrenals normal. Kidneys normal. No hydronephrosis. No evidence of obstructing ureteral stone. Bladder is nondistended. Mild prostate enlargement. Phleboliths in pelvis.  Right inguinal hernia is present with herniation of what appears to be a distal loop of small bowel. Proximal bowel severe distention is present. Distal bowel is collapsed. Associated gastric distension is present. As noted above esophagus is distended. These findings are consistent with severe distal small bowel obstruction secondary to herniation into the inguinal canal on the right. Bilateral hydroceles are noted. There is a left inguinal hernia left with herniation of fat only. Appendix not definitely identified. Sigmoid colonic diverticulosis. No evidence of diverticulitis. No pneumatosis or free air identified.  No adenopathy. Atherosclerotic vascular changes noted of the abdominal aorta.  No acute bony abnormality identified. Thoracolumbar degenerative changes i present.  IMPRESSION: 1.  Right inguinal hernia with herniation of the distal small bowel loop with severe obstruction of the small bowel. This results in severe distention of the small bowel and severe distention of the stomach. The esophagus is also severely distended. There is also a left inguinal hernia with herniation of fat only. Bilateral hydroceles are also noted. 2. Patchy bilateral pulmonary infiltrates consistent with pneumonitis possibly from  aspiration. 3. Prior CABG. These results were called by telephone at the time of interpretation on 07/21/2013 at 2:32 PM to Dr. Raeford Razor , who verbally acknowledged these results.   Electronically Signed   By: Maisie Fus  Register   On: 07/21/2013 14:35   US Renal  07/22/2013   CLINICAL DATA:  Acute renal failure, history of hypertension and diabetes.  EXAM: RENAL/URINARY TRACT ULTRASOUND COMPLETE  COMPARISON:  None.  FINDINGS: Right Kidney:  Length: 11.1 cm. Echogenicity within normal limits. No mass or hydronephrosis visualized.  Left Kidney:  Length: 11.5 cm. Echogenicity within normal limits. No mass or hydronephrosis visualized.  Bladder:  Appears normal for degree of bladder distention.  IMPRESSION: No acute abnormality of either kidney nor of the urinary bladder is demonstrated.   Electronically Signed   By: David  [place]   On: 07/22/2013 11:40   Nm Myocar Multi W/spect W/wall Motion / Ef  07/25/2013   CLINICAL DATA:  Diabetes, hypertension, preop evaluation. Previous CABG.  EXAM: NUCLEAR MEDICINE MYOCARDIAL PERFUSION IMAGING  NUCLEAR MEDICINE LEFT VENTRICULAR WALL MOTION ANALYSIS  NUCLEAR MEDICINE LEFT VENTRICULAR EJECTION FRACTION CALCULATION  TECHNIQUE: Standard single day myocardial SPECT imaging was performed after resting intravenous injection of Tc-68m sestamibi. After intravenous infusion of Lexiscan (regadenoson) under supervision of cardiology staff, sestamibiwas injected intravenously and standard myocardial SPECT imaging was performed. Quantitative gated imaging was also performed to evaluate left ventricular wall motion and estimate left ventricular ejection fraction.  Radiopharmaceutical: 10+30 mCi Tc67m sestamibiIV.  COMPARISON:  None  FINDINGS: The stress SPECT images demonstrate mildly decreased activity in the inferolateral aspect of the left ventricle, otherwise physiologic distribution of radiopharmaceutical. Rest images demonstrate similarly decreased inferolateral activity, no  new perfusion defects. The gated stress SPECT images mild septal hypokinesis. There is normal thickening and motion of the inferior inferolateral regions. Calculated left ventricular end-diastolic volume 80ml, end-systolic volume 38ml, ejection fraction of 53%.  IMPRESSION: 1. Negative for pharmacologic-stress induced ischemia. 2. Left ventricular ejection fraction 53%. 3. Septal hypokinesis, often associated with coronary bypass grafting. 4. Mild inferolateral attenuation.   Electronically Signed   By: Oley Balm M.D.   On: 07/25/2013 15:10   Dg Chest Portable 1 View  07/21/2013   CLINICAL DATA:  Short of breath.  Chest pain.  EXAM: PORTABLE CHEST - 1 VIEW  COMPARISON:  None.  FINDINGS: Low volume chest. CABG. Cardiopericardial silhouette appears within normal limits. Artifact projects over the upper chest. Monitoring leads project over the chest. No airspace disease. No effusion. Subsegmental scarring in the lingula.  Retrocardiac shadow is present, likely representing fluid distended esophagus performed on contemporaneously CT.  IMPRESSION: Low volume chest. No active cardiopulmonary disease. CABG. Retrocardiac shadow likely represents fluid-filled esophagus demonstrated on CT.   Electronically Signed   By: Andreas Newport M.D.   On: 07/21/2013 14:04   Dg Abd 2 Views  07/26/2013   CLINICAL DATA:  Evaluate for small bowel obstruction.  EXAM: ABDOMEN - 2 VIEW  COMPARISON:  07/24/2013  FINDINGS: Patient is status post median sternotomy coronary artery bypass grafting. An NG tube is appreciated with tip projecting along the  expected course of the stomach.  Air-filled dilated loops of small bowel are appreciated as well as areas of air-fluid levels within small bowel. There is no appreciable air projecting and loops of large bowel. The osseous structures unremarkable. Atherosclerotic calcifications identified within the iliac vessels.  IMPRESSION: Findings consistent with small-bowel obstruction.    Electronically Signed   By: Salome Holmes M.D.   On: 07/26/2013 07:56   Dg Abd 2 Views  07/22/2013   CLINICAL DATA:  Bowel obstruction.  EXAM: ABDOMEN - 2 VIEW  COMPARISON:  CT 07/21/2013.  FINDINGS: NG tube noted coiled in the stomach. Persistent distended loops of small bowel are noted. These appear slightly less distended than on prior study. No free air identified. Mild basilar atelectasis. Prior CABG.  IMPRESSION: NG tube noted coiled in stomach. Small bowel distention remains but is slightly less prominent than on prior CT of 07/21/2013.   Electronically Signed   By: Maisie Fus  Register   On: 07/22/2013 11:08   Dg Abd Portable 1v  07/24/2013   CLINICAL DATA:  Diabetes.  Inguinal hernia.  EXAM: PORTABLE ABDOMEN - 1 VIEW  COMPARISON:  Abdomen 07/23/2013.  CT 07/21/2013.  FINDINGS: Upper most portion of the abdomen was not imaged. NG tube noted ,tip projected over the distal stomach. There is persistent small bowel distention. Colonic gas pattern is nonspecific. Vascular calcification noted. Degenerative changes lumbar spine and both hips.  IMPRESSION: Persistent small bowel distention. NG tube noted projected over distal stomach.   Electronically Signed   By: Maisie Fus  Register   On: 07/24/2013 08:38   Dg Abd Portable 1v  07/23/2013   CLINICAL DATA:  Ileus.  EXAM: PORTABLE ABDOMEN - 1 VIEW  COMPARISON:  Abdominal series of July 22, 2013.  FINDINGS: There remain loops of mildly distended gas-filled small bowel stacked in the mid abdomen. The nasogastric tube tip in proximal port lies in the region of the distal gastric body and/or antral region. There is no significant colonic distention. A small amount of gas in the right colon and in the rectum is noted.  IMPRESSION: There is a fairly stable appearance of the small bowel ileus versus distal obstruction.   Electronically Signed   By: David  [place]   On: 07/23/2013 10:16    CBC  Recent Labs Lab 07/26/13 0350 07/27/13 0346 07/28/13 0450  07/30/13 0533 08/01/13 0345  WBC 6.4 6.0 5.6 7.1 7.4  HGB 13.0 12.4* 11.7* 11.4* 11.1*  HCT 38.4* 36.0* 34.5* 33.7* 32.5*  PLT 200 176 177 179 170  MCV 97.7 97.6 97.5 96.6 96.2  MCH 33.1 33.6 33.1 32.7 32.8  MCHC 33.9 34.4 33.9 33.8 34.2  RDW 13.1 13.2 13.2 12.8 12.9  LYMPHSABS 1.4 1.2 1.3  --   --   MONOABS 1.0 0.7 0.6  --   --   EOSABS 0.0 0.1 0.1  --   --   BASOSABS 0.0 0.0 0.0  --   --     Chemistries   Recent Labs Lab 07/26/13 0350 07/27/13 0346 07/28/13 0450 07/30/13 0533 07/31/13 0710 08/01/13 0345  NA 151* 147* 147* 139 137 134*  K 4.0 3.9 3.7 3.8 4.1 3.8  CL 116* 113* 113* 105 105 101  CO2 24 24 24 25 22 25   GLUCOSE 157* 141* 171* 176* 115* 107*  BUN 17 15 22 20 20 19   CREATININE 1.19 1.25 1.15 1.00 1.00 1.31  CALCIUM 9.2 8.8 9.0 9.0 8.7 8.4  MG 2.3  --  1.8  --   --   --  AST 22  --  19  --   --   --   ALT 17  --  13  --   --   --   ALKPHOS 50  --  45  --   --   --   BILITOT 0.6  --  0.4  --   --   --    ------------------------------------------------------------------------------------------------------------------ estimated creatinine clearance is 55.7 ml/min (by C-G formula based on Cr of 1.31). ------------------------------------------------------------------------------------------------------------------ No results found for this basename: HGBA1C,  in the last 72 hours ------------------------------------------------------------------------------------------------------------------ No results found for this basename: CHOL, HDL, LDLCALC, TRIG, CHOLHDL, LDLDIRECT,  in the last 72 hours ------------------------------------------------------------------------------------------------------------------ No results found for this basename: TSH, T4TOTAL, FREET3, T3FREE, THYROIDAB,  in the last 72 hours ------------------------------------------------------------------------------------------------------------------ No results found for this basename:  VITAMINB12, FOLATE, FERRITIN, TIBC, IRON, RETICCTPCT,  in the last 72 hours  Coagulation profile No results found for this basename: INR, PROTIME,  in the last 168 hours  No results found for this basename: DDIMER,  in the last 72 hours  Cardiac Enzymes No results found for this basename: CK, CKMB, TROPONINI, MYOGLOBIN,  in the last 168 hours ------------------------------------------------------------------------------------------------------------------ No components found with this basename: POCBNP,

## 2013-08-02 LAB — GLUCOSE, CAPILLARY
Glucose-Capillary: 89 mg/dL (ref 70–99)
Glucose-Capillary: 111 mg/dL — ABNORMAL HIGH (ref 70–99)

## 2013-08-02 NOTE — Progress Notes (Signed)
Ready for discharge from surgery standpoint.  Andres [name]. Corliss Skains, MD, [facility]  General/ Trauma Surgery  08/02/2013 8:27 AM

## 2013-08-02 NOTE — Discharge Summary (Signed)
Physician Discharge Summary  Andres [name] ZOX:[id number] DOB: [birth year] DOA: 07/21/2013  PCP: Oliver Barre, MD  Admit date: 07/21/2013 Discharge date: 08/02/2013  Time spent: 35 minutes  Recommendations for Outpatient Follow-up:  Patient will discharge to home. He will need to follow up with his primary care physician within one week of discharge. Patient will also need to follow Dr. Lindie Spruce at the specified time as well as Dr. Tresa Endo. He should continue his medications as prescribed.  Discharge Diagnoses:  Principal Problem:   SBO (small bowel obstruction) Active Problems:   DIABETES MELLITUS, TYPE II   HYPERLIPIDEMIA   HYPERTENSION   Obesity (BMI 30-39.9)   Hypotension, unspecified   Elevated troponin I level- (pk Troponin 0.34 in setting of acute renal insufficency)   CAD (coronary artery disease), hx of CABG 1997 X 6. Last nuc 2012 negative.   Inguinal hernia, Rt reduced, Lt present   Acute renal failure   Pneumonia, aspiration   Volume depletion   Discharge Condition: Stable  Diet recommendation: Carb Modified  Filed Weights   07/21/13 2100  Weight: 103.2 kg (227 lb 8.2 oz)    History of present illness:  Andres [name] is a 72 y.o. male with multiple medical problems as listed below who initially presented at his PCP's office with a 1week h/o intermittent right groin pain (has known small RIH in past), as well as 2-3 days onset hoarseness, was dyspneic with some difficulty speaking complete sentences, diaphoretic and with clammy extremities. He was found to be hypotensive at the office and sent to the EDP. At the time of my visit pt admits to nausea and vomiting x2 over the past week and his last BM was 2days ago. Pt also reports a cough productive of phlegm. He admits to intermittent chest pressure for the past 2days- and states that it feels like he needs to belch and was relieved some by tums/peptobismol. He was seen in ED and abd CT showed Right inguinal hernia with  herniation of the distal small bowel loop with severe obstruction of the small bowel.also a left inguinal hernia with herniation of fat only. Bilateral hydroceles are also noted. Patchy bilateral pulmonary infiltrates consistent with pneumonitis possibly from aspiration. Surgery was consulted and the hernia was reduced. Labs showed elevated trop .21, cr 3.6. Cards consulted and admission to medicine service requested. He was admitted for further eval and management.    Hospital Course:  This is a 72 year old male with a history of hypertension, hyperlipidemia, coronary disease, diabetes or presented to his PCPs office one week prior to being admitted for right groin pain. Patient also been experiencing nausea and vomiting for 2 weeks prior to coming into the emergency department. Patient was found to have a right inguinal hernia with herniation of the distal small bowel loops appeared structurally the small bowel on CT scan. Patient was admitted for small bowel traction secondary to incarcerated right inguinal hernia. Surgery was consulted and. Patient developed massive emesis which prompted insertion of NG tube. Patient underwent surgical right inguinal hernia repair on 12/23. A PICC line was also placed and the nail was also initiated. Patient was able to tolerate a liquid diet on 12/27 and was then advance as tolerated. His NG tube was discontinued on 12/28. Patient was also noted to have hypertension upon admission. This is likely secondary to hypovolemia this did resolve with aggressive IV hydration. He was also found to have elevated troponin. Patient did have some just discomfort. Myoview was conducted on  12/22 for preop clearance which was negative for ischemia. Patient's troponins did normalize. Patient was also noted to have aspiration pneumonia however he did complete a 7 day course of empiric antibiotics. Patient has remained stable, his oxygen saturations have been normal on room air. Patient also  has a history of mitral valve prolapse however no further investigation was warranted by cardiology. Patient also to have acute renal failure which is likely secondary to volume depletion which is likely secondary to his small bowel obstruction. He may have had some degree of ATN, as he was on HCTZ as well as an ACE inhibitor prior to admission. His renal ultrasound was conducted and was found to be normal. Patient does have a history of diabetes, his hemoglobin A1c was found be 6.8. Patient was placed on insulin sliding with CBG monitoring. She was noted to have fever on prior to his discharge. He was monitored for an additional 24 hours has remained afebrile. Chest x-ray was negative for infiltrate, his urinalysis was also negative for infection. Blood culture showed no growth the patient did not have any leukocytosis. On day of discharge patient has been able to tolerate regular diet, carb modified. He is currently stable. Patient will need to follow up with his doctors as noted above. This was discussed with the patient he does understand agree.  Procedures: Echocardiogram  - Left ventricle: The cavity size was normal. Wall thickness was normal. Systolic function was normal. The estimated ejection fraction was in the range of 60% to 65%. Possible hypokinesis of the basal-midinferior myocardium. Left ventricular diastolic function parameters were normal. - Ventricular septum: Septal motion showed paradox. These changes are consistent with a post-thoracotomy state. - Mitral valve: Prolapse, involving the posterior leaflet. Mild to moderate regurgitation directed eccentrically and anteriorly. Impressions: - Consider TEE to better evaluate the mitral valve. myoview negative for ischemia.   Right Inguinal hernia repair 12/23  Lexiscan/Myoview 1. Negative for pharmacologic-stress induced ischemia.  2. Left ventricular ejection fraction 53%.  3. Septal hypokinesis, often associated with coronary bypass  grafting.  4. Mild inferolateral attenuation.  Consultations: Cardiology General Surgery  Discharge Exam: Filed Vitals:   08/02/13 0535  BP: 130/80  Pulse: 80  Temp: 98.9 F (37.2 C)  Resp: 16   Exam  General: Well developed, well nourished, NAD, appears stated age  HEENT: NCAT, PERRLA, EOMI, Anicteic Sclera, mucous membranes moist. No pharyngeal erythema or exudates  Neck: Supple, no JVD, no masses  Cardiovascular: S1 S2 auscultated, no rubs, murmurs or gallops. Regular rate and rhythm.  Respiratory: Clear to auscultation bilaterally with equal chest rise  Abdomen: Soft, mild tenderness, nondistended, + bowel sounds, RLQ incision clean with no drainage noted  Extremities: warm dry without cyanosis clubbing or edema  Neuro: AAOx3, cranial nerves grossly intact. Strength 5/5 in patient's upper and lower extremities bilaterally  Skin: Without rashes exudates or nodules  Psych: Normal affect and demeanor with intact judgement and insight   Discharge Instructions  Discharge Orders   Future Appointments Provider Department Dept Phone   08/15/2013 10:45 AM Lennette Bihari, MD [facility] [phone]   08/18/2013 4:10 PM Cherylynn Ridges, MD [facility] [place] [phone]   10/27/2013 10:00 AM Corwin Levins, MD [facility] [phone]   Future Orders Complete By Expires   Diet Carb Modified  As directed    Discharge instructions  As directed    Comments:     Patient will discharge to home. He will need  to follow up with his primary care physician within one week of discharge.  Patient will also need to follow Dr. Lindie Spruce at the specified time as well as Dr. Tresa Endo. He should continue his medications as prescribed.   Increase activity slowly  As directed        Medication List         acetaminophen 325 MG tablet  Commonly known as:  TYLENOL  Take 325 mg by mouth every 6 (six) hours as needed for mild pain.     ADULT ASPIRIN LOW  STRENGTH 81 MG EC tablet  Generic drug:  Aspirin  Take 81 mg by mouth daily.     atorvastatin 40 MG tablet  Commonly known as:  LIPITOR  Take 1 tablet (40 mg total) by mouth daily.     clopidogrel 75 MG tablet  Commonly known as:  PLAVIX  Take 1 tablet (75 mg total) by mouth daily.     diclofenac sodium 1 % Gel  Commonly known as:  VOLTAREN  Apply 2 g topically 4 (four) times daily as needed (for arthritis).     folic acid 1 MG tablet  Commonly known as:  FOLVITE  Take 1 tablet (1 mg total) by mouth daily.     losartan-hydrochlorothiazide 100-12.5 MG per tablet  Commonly known as:  HYZAAR  Take 1 tablet by mouth daily.     metoprolol 50 MG tablet  Commonly known as:  LOPRESSOR  Take 1 tablet (50 mg total) by mouth 2 (two) times daily.     multivitamin capsule  Take 1 capsule by mouth daily.     niacin 750 MG CR tablet  Commonly known as:  NIASPAN  Take 750 mg by mouth at bedtime.     OMEGA 3 PO  Take 360 mg by mouth 2 (two) times daily.     tamsulosin 0.4 MG Caps capsule  Commonly known as:  FLOMAX  Take 1 capsule (0.4 mg total) by mouth daily.     vitamin C 1000 MG tablet  Take 1,000 mg by mouth daily.       No Known Allergies     Follow-up Information   Follow up with Lennette Bihari, MD. (office will call you)    Specialty:  Cardiology   Contact information:   [address] [place] [address] [phone]       Follow up with Cherylynn Ridges, MD. Schedule an appointment as soon as possible for a visit on 08/18/2013. (APPOINTMENT AT 4:10PM, ARRIVE 30 MINTUES EARLY TO FILL OUT PAPERWORK AND CHECK IN)    Specialty:  General Surgery   Contact information:   [address]  [place] [address] [phone]       Follow up with Oliver Barre, MD. Schedule an appointment as soon as possible for a visit in 1 week.   Specialties:  Internal Medicine, Radiology   Contact information:   [address]  [place] [address] [phone]        The results of significant diagnostics from this hospitalization (including imaging, microbiology, ancillary and laboratory) are listed below for reference.    Significant Diagnostic Studies: Ct Abdomen Pelvis Wo Contrast  07/21/2013   CLINICAL DATA:  Chest pain.  Shortness of breath.  EXAM: CT CHEST WITHOUT CONTRAST; CT ABDOMEN AND PELVIS WITHOUT CONTRAST  TECHNIQUE: Multidetector CT imaging of the chest was performed following the standard protocol without IV contrast.;  Multidetector CT imaging of the abdomen and pelvis was performed following the standard protocol without intravenous contrast.  COMPARISON:  None.  FINDINGS: CT chest:  No evidence of mediastinal or hilar adenopathy. The thoracic esophagus is severely dilated. A hiatal hernia is present. The hiatal hernia is fluid filled. There is mild thickening of the gastroesophageal junction. Prior CABG. Heart size normal. Large airways are patent. Focal bilateral small ill-defined pulmonary infiltrates are present throughout both lung fields. Although metastatic disease cannot be excluded, these are most likely infectious. Typical and atypical infection including granulomatous disease should be considered. Multifocal small areas of aspiration could present in this fashion .  CT abdomen pelvis:  Liver is normal. Spleen is normal. Pancreas is normal. Gallbladder is normal. No biliary distention.  Adrenals normal. Kidneys normal. No hydronephrosis. No evidence of obstructing ureteral stone. Bladder is nondistended. Mild prostate enlargement. Phleboliths in pelvis.  Right inguinal hernia is present with herniation of what appears to be a distal loop of small bowel. Proximal bowel severe distention is present. Distal bowel is collapsed. Associated gastric distension is present. As noted above esophagus is distended. These findings are consistent with severe distal small bowel obstruction secondary to  herniation into the inguinal canal on the right. Bilateral hydroceles are noted. There is a left inguinal hernia left with herniation of fat only. Appendix not definitely identified. Sigmoid colonic diverticulosis. No evidence of diverticulitis. No pneumatosis or free air identified.  No adenopathy. Atherosclerotic vascular changes noted of the abdominal aorta.  No acute bony abnormality identified. Thoracolumbar degenerative changes i present.  IMPRESSION: 1. Right inguinal hernia with herniation of the distal small bowel loop with severe obstruction of the small bowel. This results in severe distention of the small bowel and severe distention of the stomach. The esophagus is also severely distended. There is also a left inguinal hernia with herniation of fat only. Bilateral hydroceles are also noted. 2. Patchy bilateral pulmonary infiltrates consistent with pneumonitis possibly from aspiration. 3. Prior CABG. These results were called by telephone at the time of interpretation on 07/21/2013 at 2:32 PM to Dr. Raeford Razor , who verbally acknowledged these results.   Electronically Signed   By: Maisie Fus  Register   On: 07/21/2013 14:35   Ct Chest Wo Contrast  07/21/2013   CLINICAL DATA:  Chest pain.  Shortness of breath.  EXAM: CT CHEST WITHOUT CONTRAST; CT ABDOMEN AND PELVIS WITHOUT CONTRAST  TECHNIQUE: Multidetector CT imaging of the chest was performed following the standard protocol without IV contrast.; Multidetector CT imaging of the abdomen and pelvis was performed following the standard protocol without intravenous contrast.  COMPARISON:  None.  FINDINGS: CT chest:  No evidence of mediastinal or hilar adenopathy. The thoracic esophagus is severely dilated. A hiatal hernia is present. The hiatal hernia is fluid filled. There is mild thickening of the gastroesophageal junction. Prior CABG. Heart size normal. Large airways are patent. Focal bilateral small ill-defined pulmonary infiltrates are present  throughout both lung fields. Although metastatic disease cannot be excluded, these are most likely infectious. Typical and atypical infection including granulomatous disease should be considered. Multifocal small areas of aspiration could present in this fashion .  CT abdomen pelvis:  Liver is normal. Spleen is normal. Pancreas is normal. Gallbladder is normal. No biliary distention.  Adrenals normal. Kidneys normal. No hydronephrosis. No evidence of obstructing ureteral stone. Bladder is nondistended. Mild prostate enlargement. Phleboliths in pelvis.  Right inguinal hernia is present with herniation of what appears to be a distal  loop of small bowel. Proximal bowel severe distention is present. Distal bowel is collapsed. Associated gastric distension is present. As noted above esophagus is distended. These findings are consistent with severe distal small bowel obstruction secondary to herniation into the inguinal canal on the right. Bilateral hydroceles are noted. There is a left inguinal hernia left with herniation of fat only. Appendix not definitely identified. Sigmoid colonic diverticulosis. No evidence of diverticulitis. No pneumatosis or free air identified.  No adenopathy. Atherosclerotic vascular changes noted of the abdominal aorta.  No acute bony abnormality identified. Thoracolumbar degenerative changes i present.  IMPRESSION: 1. Right inguinal hernia with herniation of the distal small bowel loop with severe obstruction of the small bowel. This results in severe distention of the small bowel and severe distention of the stomach. The esophagus is also severely distended. There is also a left inguinal hernia with herniation of fat only. Bilateral hydroceles are also noted. 2. Patchy bilateral pulmonary infiltrates consistent with pneumonitis possibly from aspiration. 3. Prior CABG. These results were called by telephone at the time of interpretation on 07/21/2013 at 2:32 PM to Dr. Raeford Razor , who  verbally acknowledged these results.   Electronically Signed   By: Maisie Fus  Register   On: 07/21/2013 14:35   US Renal  07/22/2013   CLINICAL DATA:  Acute renal failure, history of hypertension and diabetes.  EXAM: RENAL/URINARY TRACT ULTRASOUND COMPLETE  COMPARISON:  None.  FINDINGS: Right Kidney:  Length: 11.1 cm. Echogenicity within normal limits. No mass or hydronephrosis visualized.  Left Kidney:  Length: 11.5 cm. Echogenicity within normal limits. No mass or hydronephrosis visualized.  Bladder:  Appears normal for degree of bladder distention.  IMPRESSION: No acute abnormality of either kidney nor of the urinary bladder is demonstrated.   Electronically Signed   By: David  [place]   On: 07/22/2013 11:40   Nm Myocar Multi W/spect W/wall Motion / Ef  07/25/2013   CLINICAL DATA:  Diabetes, hypertension, preop evaluation. Previous CABG.  EXAM: NUCLEAR MEDICINE MYOCARDIAL PERFUSION IMAGING  NUCLEAR MEDICINE LEFT VENTRICULAR WALL MOTION ANALYSIS  NUCLEAR MEDICINE LEFT VENTRICULAR EJECTION FRACTION CALCULATION  TECHNIQUE: Standard single day myocardial SPECT imaging was performed after resting intravenous injection of Tc-68m sestamibi. After intravenous infusion of Lexiscan (regadenoson) under supervision of cardiology staff, sestamibiwas injected intravenously and standard myocardial SPECT imaging was performed. Quantitative gated imaging was also performed to evaluate left ventricular wall motion and estimate left ventricular ejection fraction.  Radiopharmaceutical: 10+30 mCi Tc71m sestamibiIV.  COMPARISON:  None  FINDINGS: The stress SPECT images demonstrate mildly decreased activity in the inferolateral aspect of the left ventricle, otherwise physiologic distribution of radiopharmaceutical. Rest images demonstrate similarly decreased inferolateral activity, no new perfusion defects. The gated stress SPECT images mild septal hypokinesis. There is normal thickening and motion of the inferior inferolateral  regions. Calculated left ventricular end-diastolic volume 80ml, end-systolic volume 38ml, ejection fraction of 53%.  IMPRESSION: 1. Negative for pharmacologic-stress induced ischemia. 2. Left ventricular ejection fraction 53%. 3. Septal hypokinesis, often associated with coronary bypass grafting. 4. Mild inferolateral attenuation.   Electronically Signed   By: Oley Balm M.D.   On: 07/25/2013 15:10   Dg Chest Port 1 View  07/31/2013   CLINICAL DATA:  Cough for 1 week.  EXAM: PORTABLE CHEST - 1 VIEW  COMPARISON:  07/21/2013  FINDINGS: Changes from CABG surgery are stable. The cardiac silhouette is normal in size and configuration. The mediastinum is normal in contour. Normal hilar contours.  Mild opacity at the left  lung bases likely scarring. It is stable. Lungs are otherwise clear. No pleural effusion or pneumothorax.  Bony thorax is intact.  IMPRESSION: No acute cardiopulmonary disease.   Electronically Signed   By: Amie Portland M.D.   On: 07/31/2013 14:05   Dg Chest Portable 1 View  07/21/2013   CLINICAL DATA:  Short of breath.  Chest pain.  EXAM: PORTABLE CHEST - 1 VIEW  COMPARISON:  None.  FINDINGS: Low volume chest. CABG. Cardiopericardial silhouette appears within normal limits. Artifact projects over the upper chest. Monitoring leads project over the chest. No airspace disease. No effusion. Subsegmental scarring in the lingula.  Retrocardiac shadow is present, likely representing fluid distended esophagus performed on contemporaneously CT.  IMPRESSION: Low volume chest. No active cardiopulmonary disease. CABG. Retrocardiac shadow likely represents fluid-filled esophagus demonstrated on CT.   Electronically Signed   By: Andreas Newport M.D.   On: 07/21/2013 14:04   Dg Abd 2 Views  07/26/2013   CLINICAL DATA:  Evaluate for small bowel obstruction.  EXAM: ABDOMEN - 2 VIEW  COMPARISON:  07/24/2013  FINDINGS: Patient is status post median sternotomy coronary artery bypass grafting. An NG tube is  appreciated with tip projecting along the expected course of the stomach.  Air-filled dilated loops of small bowel are appreciated as well as areas of air-fluid levels within small bowel. There is no appreciable air projecting and loops of large bowel. The osseous structures unremarkable. Atherosclerotic calcifications identified within the iliac vessels.  IMPRESSION: Findings consistent with small-bowel obstruction.   Electronically Signed   By: Salome Holmes M.D.   On: 07/26/2013 07:56   Dg Abd 2 Views  07/22/2013   CLINICAL DATA:  Bowel obstruction.  EXAM: ABDOMEN - 2 VIEW  COMPARISON:  CT 07/21/2013.  FINDINGS: NG tube noted coiled in the stomach. Persistent distended loops of small bowel are noted. These appear slightly less distended than on prior study. No free air identified. Mild basilar atelectasis. Prior CABG.  IMPRESSION: NG tube noted coiled in stomach. Small bowel distention remains but is slightly less prominent than on prior CT of 07/21/2013.   Electronically Signed   By: Maisie Fus  Register   On: 07/22/2013 11:08   Dg Abd Portable 1v  07/24/2013   CLINICAL DATA:  Diabetes.  Inguinal hernia.  EXAM: PORTABLE ABDOMEN - 1 VIEW  COMPARISON:  Abdomen 07/23/2013.  CT 07/21/2013.  FINDINGS: Upper most portion of the abdomen was not imaged. NG tube noted ,tip projected over the distal stomach. There is persistent small bowel distention. Colonic gas pattern is nonspecific. Vascular calcification noted. Degenerative changes lumbar spine and both hips.  IMPRESSION: Persistent small bowel distention. NG tube noted projected over distal stomach.   Electronically Signed   By: Maisie Fus  Register   On: 07/24/2013 08:38   Dg Abd Portable 1v  07/23/2013   CLINICAL DATA:  Ileus.  EXAM: PORTABLE ABDOMEN - 1 VIEW  COMPARISON:  Abdominal series of July 22, 2013.  FINDINGS: There remain loops of mildly distended gas-filled small bowel stacked in the mid abdomen. The nasogastric tube tip in proximal port lies in  the region of the distal gastric body and/or antral region. There is no significant colonic distention. A small amount of gas in the right colon and in the rectum is noted.  IMPRESSION: There is a fairly stable appearance of the small bowel ileus versus distal obstruction.   Electronically Signed   By: David  [place]   On: 07/23/2013 10:16    Microbiology: Recent  Results (from the past 240 hour(s))  CULTURE, BLOOD (ROUTINE X 2)     Status: None   Collection Time    07/31/13  4:41 PM      Result Value Range Status   Specimen Description BLOOD RIGHT HAND   Final   Special Requests BOTTLES DRAWN AEROBIC ONLY 5CC   Final   Culture  Setup Time     Final   Value: 07/31/2013 22:55     Performed at [facility]   Culture     Final   Value:        BLOOD CULTURE RECEIVED NO GROWTH TO DATE CULTURE WILL BE HELD FOR 5 DAYS BEFORE ISSUING A FINAL NEGATIVE REPORT     Performed at [facility]   Report Status PENDING   Incomplete  CULTURE, BLOOD (ROUTINE X 2)     Status: None   Collection Time    07/31/13  4:41 PM      Result Value Range Status   Specimen Description BLOOD LEFT HAND   Final   Special Requests BOTTLES DRAWN AEROBIC ONLY 2CC   Final   Culture  Setup Time     Final   Value: 07/31/2013 22:55     Performed at [facility]   Culture     Final   Value:        BLOOD CULTURE RECEIVED NO GROWTH TO DATE CULTURE WILL BE HELD FOR 5 DAYS BEFORE ISSUING A FINAL NEGATIVE REPORT     Performed at [facility]   Report Status PENDING   Incomplete     Labs: Basic Metabolic Panel:  Recent Labs Lab 07/27/13 0346 07/28/13 0450 07/30/13 0533 07/31/13 0710 08/01/13 0345  NA 147* 147* 139 137 134*  K 3.9 3.7 3.8 4.1 3.8  CL 113* 113* 105 105 101  CO2 24 24 25 22 25   GLUCOSE 141* 171* 176* 115* 107*  BUN 15 22 20 20 19   CREATININE 1.25 1.15 1.00 1.00 1.31  CALCIUM 8.8 9.0 9.0 8.7 8.4  MG  --  1.8  --   --   --   PHOS  --  4.0  --   --   --    Liver Function  Tests:  Recent Labs Lab 07/28/13 0450  AST 19  ALT 13  ALKPHOS 45  BILITOT 0.4  PROT 5.8*  ALBUMIN 2.5*   No results found for this basename: LIPASE, AMYLASE,  in the last 168 hours No results found for this basename: AMMONIA,  in the last 168 hours CBC:  Recent Labs Lab 07/27/13 0346 07/28/13 0450 07/30/13 0533 08/01/13 0345  WBC 6.0 5.6 7.1 7.4  NEUTROABS 4.1 3.6  --   --   HGB 12.4* 11.7* 11.4* 11.1*  HCT 36.0* 34.5* 33.7* 32.5*  MCV 97.6 97.5 96.6 96.2  PLT 176 177 179 170   Cardiac Enzymes: No results found for this basename: CKTOTAL, CKMB, CKMBINDEX, TROPONINI,  in the last 168 hours BNP: BNP (last 3 results) No results found for this basename: PROBNP,  in the last 8760 hours CBG:  Recent Labs Lab 08/01/13 1736 08/01/13 2247 08/01/13 2353 08/02/13 0539 08/02/13 [id number]  GLUCAP 120* 112* 104* 89 111*       Signed:  Sindia Kowalczyk  [facility] 08/02/2013, 10:25 AM

## 2013-08-02 NOTE — Progress Notes (Signed)
7 Days Post-Op  Subjective: Pt feels great.  No N/V, abdominal pain.  Tolerating diet and having BM's.  Ambulating well.  Fevers resolved.  Wants to go home.  Objective: Vital signs in last 24 hours: Temp:  [98.5 F (36.9 C)-99.5 F (37.5 C)] 98.9 F (37.2 C) (12/30 0535) Pulse Rate:  [75-91] 80 (12/30 0535) Resp:  [16-18] 16 (12/30 0535) BP: (119-134)/(62-82) 130/80 mmHg (12/30 0535) SpO2:  [97 %-98 %] 98 % (12/30 0535) Last BM Date: 08/01/13  Intake/Output from previous day: 12/29 0701 - 12/30 0700 In: 1956.7 [P.O.:240; I.V.:1716.7] Out: -  Intake/Output this shift:    PE: Gen:  Alert, NAD, pleasant Card:  RRR, no M/G/R heard Pulm:  B/l wheezes and expiratory rhonchi, good effort, IS at 1250 Abd: Obese, soft, no tenderness over incision site, ND, +BS, no HSM, incision in RLQ C/D/I and nearly healed, incision well approximated and no drainage  Lab Results:   Recent Labs  08/01/13 0345  WBC 7.4  HGB 11.1*  HCT 32.5*  PLT 170   BMET  Recent Labs  07/31/13 0710 08/01/13 0345  NA 137 134*  K 4.1 3.8  CL 105 101  CO2 22 25  GLUCOSE 115* 107*  BUN 20 19  CREATININE 1.00 1.31  CALCIUM 8.7 8.4   PT/INR No results found for this basename: LABPROT, INR,  in the last 72 hours CMP     Component Value Date/Time   NA 134* 08/01/2013 0345   K 3.8 08/01/2013 0345   CL 101 08/01/2013 0345   CO2 25 08/01/2013 0345   GLUCOSE 107* 08/01/2013 0345   BUN 19 08/01/2013 0345   CREATININE 1.31 08/01/2013 0345   CALCIUM 8.4 08/01/2013 0345   PROT 5.8* 07/28/2013 0450   ALBUMIN 2.5* 07/28/2013 0450   AST 19 07/28/2013 0450   ALT 13 07/28/2013 0450   ALKPHOS 45 07/28/2013 0450   BILITOT 0.4 07/28/2013 0450   GFRNONAA 52* 08/01/2013 0345   GFRAA 60* 08/01/2013 0345   Lipase  No results found for this basename: lipase       Studies/Results: Dg Chest Port 1 View  07/31/2013   CLINICAL DATA:  Cough for 1 week.  EXAM: PORTABLE CHEST - 1 VIEW  COMPARISON:   07/21/2013  FINDINGS: Changes from CABG surgery are stable. The cardiac silhouette is normal in size and configuration. The mediastinum is normal in contour. Normal hilar contours.  Mild opacity at the left lung bases likely scarring. It is stable. Lungs are otherwise clear. No pleural effusion or pneumothorax.  Bony thorax is intact.  IMPRESSION: No acute cardiopulmonary disease.   Electronically Signed   By: Amie Portland M.D.   On: 07/31/2013 14:05    Anti-infectives: Anti-infectives   Start     Dose/Rate Route Frequency Ordered Stop   07/26/13 1030  polymyxin B 500,000 Units, bacitracin 50,000 Units in sodium chloride irrigation 0.9 % 500 mL irrigation  Status:  Discontinued       As needed 07/26/13 1102 07/26/13 1152   07/26/13 0946  ceFAZolin (ANCEF) 2-3 GM-% IVPB SOLR    Comments:  Glo Herring   : cabinet override      07/26/13 0946 07/26/13 2159   07/24/13 1400  piperacillin-tazobactam (ZOSYN) IVPB 3.375 g     3.375 g 12.5 mL/hr over 240 Minutes Intravenous 3 times per day 07/24/13 1003 07/28/13 0140   07/21/13 2200  piperacillin-tazobactam (ZOSYN) IVPB 2.25 g  Status:  Discontinued     2.25 g  100 mL/hr over 30 Minutes Intravenous 3 times per day 07/21/13 2120 07/24/13 1003   07/21/13 1445  piperacillin-tazobactam (ZOSYN) IVPB 3.375 g     3.375 g 100 mL/hr over 30 Minutes Intravenous  Once 07/21/13 1435 07/21/13 1520       Assessment/Plan Right inguinal hernia. Incarcerated with small bowel  POD #7 S/p HERNIA REPAIR INGUINAL ADULT, 07/26/2013, Cherylynn Ridges, MD  -He also has a [facility]  -Post op ileus seems improved 2. Hypotension  3. Acute renal failure now resolved  4. Possible aspiration pneumonia  5. CAD, s/p CABG with positive troponin (.21) on Plavix  6. Hx of hypertension  7. AODM  8. Hyperlipidemia  9. PVD  10. 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 Hx of hepatitis  12. BPH  13. PCM weaned off TNA   Plan:  1. On 6N, not requiring pain medications  2. Ambulating and IS  3.  Appreciate medicines management of his chronic medical problems  4. DVT proph- scd's and lovenox  5. Having BM's. Tolerating soft food, switch to carb modified  6. Fevers have resolved, still has wheezing and rhonchi in lungs, but xray was not impressive, IS at 1250 7. Home today per medicine, will sign off. 8. F/u with Dr. Lindie Spruce 1/15 at 4:10pm, arrive 30 min early to fill out paperwork, discontinue dry dressing to groin     LOS: 12 days    Andres [name], Andres [name] 08/02/2013, 7:49 AM Pager: [phone]

## 2013-08-06 LAB — CULTURE, BLOOD (ROUTINE X 2)
Culture: NO GROWTH
Culture: NO GROWTH

## 2013-08-12 ENCOUNTER — Telehealth: Payer: Self-pay | Admitting: *Deleted

## 2013-08-12 NOTE — Telephone Encounter (Signed)
[facility] to [facility] to his appointment with you on Monday.  When I phoned to clarify his question no answer, no answering machine/voicemail

## 2013-08-12 NOTE — Telephone Encounter (Signed)
Should be ok, to monitor for any CP, sob

## 2013-08-15 ENCOUNTER — Encounter: Payer: Self-pay | Admitting: Cardiovascular Disease

## 2013-08-15 ENCOUNTER — Ambulatory Visit (INDEPENDENT_AMBULATORY_CARE_PROVIDER_SITE_OTHER): Payer: Medicare PPO | Admitting: Cardiovascular Disease

## 2013-08-15 VITALS — BP 80/60 | HR 67 | Ht 66.0 in | Wt 214.5 lb

## 2013-08-15 DIAGNOSIS — I10 Essential (primary) hypertension: Secondary | ICD-10-CM

## 2013-08-15 DIAGNOSIS — E785 Hyperlipidemia, unspecified: Secondary | ICD-10-CM

## 2013-08-15 DIAGNOSIS — I2510 Atherosclerotic heart disease of native coronary artery without angina pectoris: Secondary | ICD-10-CM

## 2013-08-15 DIAGNOSIS — E119 Type 2 diabetes mellitus without complications: Secondary | ICD-10-CM

## 2013-08-15 MED ORDER — LOSARTAN POTASSIUM-HCTZ 50-12.5 MG PO TABS: 1.0000 | ORAL_TABLET | Freq: Every day | ORAL | Status: DC

## 2013-08-15 NOTE — Patient Instructions (Signed)
Dr Claiborne Billings has made the following medication changes:  STOP Losartan-HCT 100/12.5mg  QD  START Losartan-HCT 50/12.5 mg QD  Dr Claiborne Billings wants you to follow-up in 6 months.  You will receive a reminder letter in the mail two months in advance. If you don't receive a letter, please call our office to schedule the follow-up appointment.

## 2013-08-16 ENCOUNTER — Encounter: Payer: Self-pay | Admitting: Internal Medicine

## 2013-08-16 ENCOUNTER — Ambulatory Visit (INDEPENDENT_AMBULATORY_CARE_PROVIDER_SITE_OTHER): Payer: Medicare PPO | Admitting: Internal Medicine

## 2013-08-16 VITALS — BP 92/62 | HR 70 | Temp 97.4°F | Ht 66.0 in | Wt 215.5 lb

## 2013-08-16 DIAGNOSIS — E785 Hyperlipidemia, unspecified: Secondary | ICD-10-CM

## 2013-08-16 DIAGNOSIS — E119 Type 2 diabetes mellitus without complications: Secondary | ICD-10-CM

## 2013-08-16 DIAGNOSIS — I10 Essential (primary) hypertension: Secondary | ICD-10-CM

## 2013-08-16 NOTE — Assessment & Plan Note (Signed)
stable overall by history and exam, recent data reviewed with pt, and pt to continue medical treatment as before,  to f/u any worsening symptoms or concerns Lab Results  Component Value Date   LDLCALC 66 04/18/2013

## 2013-08-16 NOTE — Progress Notes (Signed)
Pre-visit discussion using our clinic review tool. No additional management support is needed unless otherwise documented below in the visit note.  

## 2013-08-16 NOTE — Telephone Encounter (Signed)
Pt has already been seen 08/16/13. Closing encounter...Andres [name]

## 2013-08-16 NOTE — Progress Notes (Signed)
Subjective:    Patient ID: Andres [name], male    DOB: 30-Sep-1940, 73 y.o.   MRN: [id number]  HPI  Here to f/u recent surgury,To f/u with surgury tomorrow as well. Saw cardiology yesterday with losartHCT reduced from 100/12/5 to 50/12.5.  Pt denies chest pain, increased sob or doe, wheezing, orthopnea, PND, increased LE swelling, palpitations, dizziness or syncope.  Pt denies new neurological symptoms such as new headache, or facial or extremity weakness or numbness.  Denies worsening reflux, abd pain, dysphagia, n/v, bowel change or blood.Having regular BM  Stress test neg last hospn Past Medical History  Diagnosis Date  . Hyperlipidemia   . Hypertension   . Hepatitis   . Obesity, morbid   . Benign prostatic hypertrophy   . Cataracts, bilateral   . Carpal tunnel syndrome, bilateral   . Diverticulosis of colon   . Nephrolithiasis   . Low back pain   . ALLERGIC RHINITIS   . Head mass   . Diabetes mellitus type II   . Coronary artery disease     Dr. Claiborne Billings; 2D ECHO, 12/11/2011 - EF 50-55%, normal; NUCLEAR STRESS TEST, 10/16/2010 - no evidemce of inducible ischemia  . 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 Last nuc 2012 negative. 07/21/2013  . Inguinal hernia, Rt reduced, Lt present 07/21/2013  . Stroke   . GERD (gastroesophageal reflux disease)   . H/O hiatal hernia    Past Surgical History  Procedure Laterality Date  . Appendectomy    . Coronary artery bypass graft    . Cardiac catheterization    . Cataract extraction Right   . Inguinal hernia repair Right 07/26/2013    Procedure: REPAIR  INCARCERATED RIGHT INGUINAL  HERNIA ;  Surgeon: Gwenyth Ober, MD;  Location: [place] [facility];  Service: General;  Laterality: Right;    reports that he has quit smoking. He does not have any smokeless tobacco history on file. He  reports that he does not drink alcohol or use illicit drugs. family history includes Alzheimer's disease in his father; Cancer in his brother; Heart attack in his brother, maternal grandmother, and mother; Hypertension in his maternal grandmother and son; Kidney disease in his brother and son. No Known Allergies Current Outpatient Prescriptions on File Prior to Visit  Medication Sig Dispense Refill  . acetaminophen (TYLENOL) 325 MG tablet Take 325 mg by mouth every 6 (six) hours as needed for mild pain.      . Ascorbic Acid (VITAMIN C) 1000 MG tablet Take 1,000 mg by mouth daily.      . Aspirin (ADULT ASPIRIN LOW STRENGTH) 81 MG EC tablet Take 81 mg by mouth daily.        Marland Kitchen atorvastatin (LIPITOR) 40 MG tablet Take 1 tablet (40 mg total) by mouth daily.  90 tablet  3  . clopidogrel (PLAVIX) 75 MG tablet Take 1 tablet (75 mg total) by mouth daily.  90 tablet  3  . diclofenac sodium (VOLTAREN) 1 % GEL Apply 2 g topically 4 (four) times daily as needed (for arthritis).      . folic acid (FOLVITE) 1 MG tablet Take 1 tablet (1 mg total) by mouth daily.  90 tablet  2  . losartan-hydrochlorothiazide (HYZAAR) 50-12.5 MG per tablet Take 1 tablet by mouth daily.  90 tablet  1  .  metoprolol (LOPRESSOR) 50 MG tablet Take 1 tablet (50 mg total) by mouth 2 (two) times daily.  180 tablet  3  . Multiple Vitamin (MULTIVITAMIN) capsule Take 1 capsule by mouth daily.       . niacin (NIASPAN) 750 MG CR tablet Take 750 mg by mouth at bedtime.      . Omega-3 Fatty Acids (OMEGA 3 PO) Take 360 mg by mouth 2 (two) times daily.      . tamsulosin (FLOMAX) 0.4 MG CAPS capsule Take 1 capsule (0.4 mg total) by mouth daily.  90 capsule  3   No current facility-administered medications on file prior to visit.      Review of Systems  Constitutional: Negative for unexpected weight change, or unusual diaphoresis  HENT: Negative for tinnitus.   Eyes: Negative for photophobia and visual disturbance.  Respiratory: Negative for  choking and stridor.   Gastrointestinal: Negative for vomiting and blood in stool.  Genitourinary: Negative for hematuria and decreased urine volume.  Musculoskeletal: Negative for acute joint swelling Skin: Negative for color change and wound.  Neurological: Negative for tremors and numbness other than noted  Psychiatric/Behavioral: Negative for decreased concentration or  hyperactivity.       Objective:   Physical Exam BP 92/62  Pulse 70  Temp(Src) 97.4 F (36.3 C) (Oral)  Ht 5\' 6"  (1.676 m)  Wt 215 lb 8 oz (97.75 kg)  BMI 34.80 kg/m2  SpO2 92% VS noted,  Constitutional: Pt appears well-developed and well-nourished.  HENT: Head: NCAT.  Right Ear: External ear normal.  Left Ear: External ear normal.  Eyes: Conjunctivae and EOM are normal. Pupils are equal, round, and reactive to light.  Neck: Normal range of motion. Neck supple.  Cardiovascular: Normal rate and regular rhythm.   Pulmonary/Chest: Effort normal and breath sounds normal.  Abd:  Soft, NT, non-distended, + BS Neurological: Pt is alert. Not confused  Skin: Skin is warm. No erythema.  Psychiatric: Pt behavior is normal. Thought content normal.      Assessment & Plan:

## 2013-08-16 NOTE — Assessment & Plan Note (Signed)
Now on reduced losartHCT, cont follow BP at home and next visit

## 2013-08-16 NOTE — Assessment & Plan Note (Signed)
stable overall by history and exam, recent data reviewed with pt, and pt to continue medical treatment as before,  to f/u any worsening symptoms or concerns Lab Results  Component Value Date   HGBA1C 6.4* 07/21/2013

## 2013-08-16 NOTE — Patient Instructions (Signed)
Please continue all other medications as before, and refills have been done if requested. Please have the pharmacy call with any other refills you may need. Please continue your efforts at being more active, low cholesterol diabetic diet, and weight control.  Please keep your appointments with your specialists as you have planned  Please return in 3 months, or sooner if needed

## 2013-08-17 ENCOUNTER — Telehealth: Payer: Self-pay

## 2013-08-17 NOTE — Telephone Encounter (Signed)
Relevant patient education mailed to patient.  

## 2013-08-18 ENCOUNTER — Telehealth: Payer: Self-pay | Admitting: Internal Medicine

## 2013-08-18 ENCOUNTER — Ambulatory Visit (INDEPENDENT_AMBULATORY_CARE_PROVIDER_SITE_OTHER): Payer: Medicare HMO | Admitting: General Surgery

## 2013-08-18 ENCOUNTER — Encounter (INDEPENDENT_AMBULATORY_CARE_PROVIDER_SITE_OTHER): Payer: Self-pay | Admitting: General Surgery

## 2013-08-18 VITALS — BP 124/76 | HR 66 | Temp 97.5°F | Resp 16 | Ht 66.0 in | Wt 221.4 lb

## 2013-08-18 DIAGNOSIS — Z09 Encounter for follow-up examination after completed treatment for conditions other than malignant neoplasm: Secondary | ICD-10-CM

## 2013-08-18 NOTE — Progress Notes (Signed)
This patient had a bowel obstruction secondary to incarcerated right inguinal hernia. He is doing well since the repair although he had a long postoperative course with a perioperative ileus.  On examination today he has no recurrence of his hernia. His right testicle is normal. He does have a slight hydrocele on the right side. The incision has healed well with no evidence of infection. He has some slight tingling in that area but nothing that is bothersome.  His biggest concern is that his urine flow is not as brisk as it was preoperatively. This may be indicative of some premorbid prostate disease. I advised the patient that if it should get worse before a month has gone by he should contact his primary care doctor for a urology consultation. It is not any better in 1 month and he should seek a urology consultation also. He should return to see me on an as-needed basis.

## 2013-08-18 NOTE — Telephone Encounter (Deleted)
Pt was scheduled to see dr wyatt today @  [facility] for hernia repair need a referral so I can fax back to [facility]

## 2013-09-01 ENCOUNTER — Encounter: Payer: Self-pay | Admitting: Cardiovascular Disease

## 2013-09-01 NOTE — Progress Notes (Signed)
Patient ID: Andres [name], male   DOB: Jan 03, 1941, 73 y.o.   MRN: [id number]     HPI: Andres [name], is a 73 y.o. male who presents to the office today for a 6 month cardiology evaluation.   Andres [name] has established coronary as well a cerebral vascular disease. In 1997 he underwent CABG revascularization surgery x6 by Dr. Servando Snare and had a LIMA placed to the LAD, sequential vein to the intermediate and second diagonal, vein to the OM 2, sequential vein to the acute margin and PDA. 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 A nuclear perfusion study in March 2012 was unchanged and showed previously noted inferior scar. An echo Doppler study in May 2013 showed mild asymmetric left ventricular hypertrophy with an ejection fraction of 50-55%. There was mild posterior wall hypokinesis with mild inferior wall hypokinesis. He had moderate mitral regurgitation and mild aortic valve sclerosis with mild tricuspid insufficiency.    In December Andres [name] was hospitalized with renal insufficiency, hypotension, and had mildly positive troponin level. I did review these hospital records. He was found to have small bowel obstruction with right inguinal hernia with herniation of the distal small bowel loop. On December 18 he underwent surgery for this incarcerated right inguinal hernia with small bowel obstruction by Dr. Hulen Skains.  He has felt well from a cardiac standpoint and tolerated surgery well. He denies recent chest pain. He presents for evaluation.   Past Medical History  Diagnosis Date  . Hyperlipidemia   . Hypertension   . Hepatitis   . Obesity, morbid   . Benign prostatic hypertrophy   . Cataracts, bilateral   . Carpal tunnel syndrome, bilateral   . Diverticulosis of colon   . Nephrolithiasis   . Low back pain   . ALLERGIC RHINITIS   . Head mass   . Diabetes mellitus type II   . Coronary artery disease     Dr. Claiborne Billings; 2D ECHO, 12/11/2011 - EF 50-55%, normal; NUCLEAR  STRESS TEST, 10/16/2010 - no evidemce of inducible ischemia  . 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 Last nuc 2012 negative. 07/21/2013  . Inguinal hernia, Rt reduced, Lt present 07/21/2013  . Stroke   . GERD (gastroesophageal reflux disease)   . H/O hiatal hernia     Past Surgical History  Procedure Laterality Date  . Appendectomy    . Coronary artery bypass graft    . Cardiac catheterization    . Cataract extraction Right   . Inguinal hernia repair Right 07/26/2013    Procedure: REPAIR  INCARCERATED RIGHT INGUINAL  HERNIA ;  Surgeon: Gwenyth Ober, MD;  Location[facility];  Service: General;  Laterality: Right;    No Known Allergies  Current Outpatient Prescriptions  Medication Sig Dispense Refill  . acetaminophen (TYLENOL) 325 MG tablet Take 325 mg by mouth every 6 (six) hours as needed for mild pain.      . Ascorbic Acid (VITAMIN C) 1000 MG tablet Take 1,000 mg by mouth daily.      . Aspirin (ADULT ASPIRIN LOW STRENGTH) 81 MG EC tablet Take 81 mg by mouth daily.        Marland Kitchen atorvastatin (LIPITOR) 40 MG tablet Take 1 tablet (40 mg total) by mouth daily.  90 tablet  3  .  clopidogrel (PLAVIX) 75 MG tablet Take 1 tablet (75 mg total) by mouth daily.  90 tablet  3  . diclofenac sodium (VOLTAREN) 1 % GEL Apply 2 g topically 4 (four) times daily as needed (for arthritis).      . folic acid (FOLVITE) 1 MG tablet Take 1 tablet (1 mg total) by mouth daily.  90 tablet  2  . metoprolol (LOPRESSOR) 50 MG tablet Take 1 tablet (50 mg total) by mouth 2 (two) times daily.  180 tablet  3  . Multiple Vitamin (MULTIVITAMIN) capsule Take 1 capsule by mouth daily.       . niacin (NIASPAN) 750 MG CR tablet Take 750 mg by mouth at bedtime.      . Omega-3 Fatty Acids (OMEGA 3 PO) Take 360 mg by mouth 2 (two) times daily.      .  tamsulosin (FLOMAX) 0.4 MG CAPS capsule Take 1 capsule (0.4 mg total) by mouth daily.  90 capsule  3  . losartan-hydrochlorothiazide (HYZAAR) 50-12.5 MG per tablet Take 1 tablet by mouth daily.  90 tablet  1   No current facility-administered medications for this visit.    Social history is notable that he is widowed and has 2 children one grandchild. He does try to walk her there is no tobacco or alcohol use.  ROS is negative for fever chills night sweats. He denies rash. There are no visual changes. He denies changes in hearing. He denies any significant increasing shortness of breath. There is no chest pain. He denies presyncope or syncope. He denies recurrent abdominal pain. His stool is normal character. He denies blood in urine. There is no claudication. He denies bleeding on aspirin and Plavix. He does have hyperlipidemia on Lipitor. He denies paresthesias. Other comprehensive 14 point system review is negative.   PE BP 80/60  Pulse 67  Ht 5\' 6"  (1.676 m)  Wt 214 lb 8 oz (97.297 kg)  BMI 34.64 kg/m2  General: Alert, oriented, no distress.  HEENT: Normocephalic, atraumatic. Pupils round and reactive; sclera anicteric;no lid lag,  Nose without nasal septal hypertrophy Mouth/Parynx benign; Mallinpatti scale 3 Neck: No JVD, no carotid bruits with normal carotid up stroke. Lungs: clear to ausculatation and percussion; no wheezing or rales Chest wall: No tenderness to palpation Heart: RRR, s1 s2 normal 1/6 systolic murmur; no S3 gallop. No rub. Abdomen: Moderate central adiposity; soft, nontender; no hepatosplenomehaly, BS+; abdominal aorta nontender and not dilated by palpation. Back: No CVA tenderness Pulses 2+ Extremities: no clubbing cyanosis or edema, Homan's sign negative  Neurologic: grossly nonfocal Psychological: Normal affect and mood.  ECG: (Independently read by me): Normal sinus rhythm with mild sinus arrhythmia. Nonspecific T changes inferolaterally. Early  repolarization. Normal intervals. LABS:  BMET    Component Value Date/Time   NA 134* 08/01/2013 0345   K 3.8 08/01/2013 0345   CL 101 08/01/2013 0345   CO2 25 08/01/2013 0345   GLUCOSE 107* 08/01/2013 0345   BUN 19 08/01/2013 0345   CREATININE 1.31 08/01/2013 0345   CALCIUM 8.4 08/01/2013 0345   GFRNONAA 52* 08/01/2013 0345   GFRAA 60* 08/01/2013 0345     Hepatic Function Panel     Component Value Date/Time   PROT 5.8* 07/28/2013 0450   ALBUMIN 2.5* 07/28/2013 0450   AST 19 07/28/2013 0450   ALT 13 07/28/2013 0450   ALKPHOS 45 07/28/2013 0450   BILITOT 0.4 07/28/2013 0450   BILIDIR 0.1 04/18/2013 0959     CBC  Component Value Date/Time   WBC 7.4 08/01/2013 0345   RBC 3.38* 08/01/2013 0345   HGB 11.1* 08/01/2013 0345   HCT 32.5* 08/01/2013 0345   PLT 170 08/01/2013 0345   MCV 96.2 08/01/2013 0345   MCH 32.8 08/01/2013 0345   MCHC 34.2 08/01/2013 0345   RDW 12.9 08/01/2013 0345   LYMPHSABS 1.3 07/28/2013 0450   MONOABS 0.6 07/28/2013 0450   EOSABS 0.1 07/28/2013 0450   BASOSABS 0.0 07/28/2013 0450     BNP No results found for this basename: probnp    Lipid Panel     Component Value Date/Time   CHOL 119 04/18/2013 0959   TRIG 124 07/26/2013 0350   HDL 38.80* 04/18/2013 0959   CHOLHDL 3 04/18/2013 0959   VLDL 13.8 04/18/2013 0959   LDLCALC 66 04/18/2013 0959     RADIOLOGY: No results found.    ASSESSMENT AND PLAN: Mr. Hutmacher is now almost 18 years status post CABG revascularization surgery. His last stress test in March 2012 was unchanged from previously and revealed inferior scar without associated ischemia. He recently was hospitalized with an incarcerated hernia leading to small bowel obstruction and underwent surgery by Dr. Hulen Skains. He was hypotensive at that time. He also did have renal insufficiency. His blood pressure today is low on his current medical regimen. I'm recommending him to decrease his losartan HCT from 100/12.5 mg to 50/12.5 mg. He  will monitor his blood pressure or notify us if he has symptoms of lightheadedness or dizziness. He currently is without chest pain. His heart rhythm is regular. He's not having bleeding issues on aspirin and clopidogrel. His LDL cholesterol in September 2014 was excellent at 66 on his current dose of atorvastatin 40 mg. I will see him in 6 months for cardiology reassessment.    Troy Sine, MD, [facility]  09/01/2013 11:52 PM

## 2013-09-02 ENCOUNTER — Other Ambulatory Visit: Payer: Self-pay

## 2013-09-02 MED ORDER — LOSARTAN POTASSIUM-HCTZ 50-12.5 MG PO TABS: 1.0000 | ORAL_TABLET | Freq: Every day | ORAL | Status: DC

## 2013-09-02 MED ORDER — ATORVASTATIN CALCIUM 40 MG PO TABS: 40.0000 mg | ORAL_TABLET | Freq: Every day | ORAL | Status: DC

## 2013-09-02 MED ORDER — METOPROLOL TARTRATE 50 MG PO TABS: 50.0000 mg | ORAL_TABLET | Freq: Two times a day (BID) | ORAL | Status: DC

## 2013-09-02 MED ORDER — CLOPIDOGREL BISULFATE 75 MG PO TABS: 75.0000 mg | ORAL_TABLET | Freq: Every day | ORAL | Status: DC

## 2013-09-02 MED ORDER — FOLIC ACID 1 MG PO TABS: 1.0000 mg | ORAL_TABLET | Freq: Every day | ORAL | Status: DC

## 2013-09-02 MED ORDER — TAMSULOSIN HCL 0.4 MG PO CAPS: 0.4000 mg | ORAL_CAPSULE | Freq: Every day | ORAL | Status: DC

## 2013-09-06 ENCOUNTER — Telehealth: Payer: Self-pay | Admitting: Internal Medicine

## 2013-09-06 ENCOUNTER — Telehealth: Payer: Self-pay | Admitting: *Deleted

## 2013-09-06 NOTE — Telephone Encounter (Signed)
Relevant patient education mailed to patient.  

## 2013-09-06 NOTE — Telephone Encounter (Signed)
Patient phoned requesting refills be sent to mail order pharmacy.  Chart review shows Robin sent refill requests to pharmacy on 09/02/13.  Notified patient.

## 2013-09-15 ENCOUNTER — Telehealth: Payer: Self-pay | Admitting: *Deleted

## 2013-09-15 NOTE — Telephone Encounter (Signed)
Notified patient of MD response and also advised him to stop exercising until his f/u with Izora Gala.  Transferred him to scheduling.

## 2013-09-15 NOTE — Telephone Encounter (Signed)
Patient phoned c/o left leg pain-cramping, especially when he exercises.  Patient wants to know if he needs to come in for a f/u with you.  Please advise.  CB#[phone]

## 2013-09-15 NOTE — Telephone Encounter (Signed)
Yes, or he could see nancy NP, as he may need LE arterial dopplers given his risk factors and symtpoms

## 2013-09-16 ENCOUNTER — Ambulatory Visit (INDEPENDENT_AMBULATORY_CARE_PROVIDER_SITE_OTHER): Payer: Medicare HMO | Admitting: Physician Assistant

## 2013-09-16 ENCOUNTER — Encounter: Payer: Self-pay | Admitting: Physician Assistant

## 2013-09-16 VITALS — BP 152/80 | HR 66 | Temp 96.9°F | Wt 228.4 lb

## 2013-09-16 DIAGNOSIS — M79609 Pain in unspecified limb: Secondary | ICD-10-CM

## 2013-09-16 DIAGNOSIS — M79605 Pain in left leg: Secondary | ICD-10-CM

## 2013-09-16 NOTE — Progress Notes (Signed)
Pre visit review using our clinic review tool, if applicable. No additional management support is needed unless otherwise documented below in the visit note. 

## 2013-09-16 NOTE — Progress Notes (Signed)
Subjective:    Patient ID: Andres [name], male    DOB: [birth year], 73 y.o.   MRN: [id number]  HPI Comments: Patient is a 73 year old male who presents to the clinic with complaint of left leg pain. Patient reports in the past couple weeks he has noticed a pain along the outside of his left leg. Has had the same pain in the right leg but only briefly. Reports the pain is worse in the morning when he gets done exercising, states he uses a rowing machine, and has pain in his leg when trying to get up from the machine, pain eventually decreases later in the morning. Reports it does bother him sometimes at night when he sleeps on his left side. Has used tylenol with moderate relief. Denies SOB, cough, extremity swelling. Significant hx includes patient on Plavix and 81 mg Aspirin daily, TIA history, recent surgery for inguinal hernia repair 12/14.   Review of Systems  Constitutional: Negative for fever and chills.  Eyes: Negative for visual disturbance.  Respiratory: Negative for cough and shortness of breath.   Cardiovascular: Negative for chest pain, palpitations and leg swelling.  Gastrointestinal: Negative for nausea and vomiting.  Musculoskeletal: Positive for myalgias (left leg pain).  Skin: Negative for rash.  Neurological: Negative for dizziness, weakness, light-headedness and numbness.   Past Medical History  Diagnosis Date  . Hyperlipidemia   . Hypertension   . Hepatitis   . Obesity, morbid   . Benign prostatic hypertrophy   . Cataracts, bilateral   . Carpal tunnel syndrome, bilateral   . Diverticulosis of colon   . Nephrolithiasis   . Low back pain   . ALLERGIC RHINITIS   . Head mass   . Diabetes mellitus type II   . Coronary artery disease     Dr. Claiborne Billings; 2D ECHO, 12/11/2011 - EF 50-55%, normal; NUCLEAR STRESS TEST, 10/16/2010 - no evidemce of inducible ischemia  . 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 Last nuc 2012 negative. 07/21/2013  . Inguinal hernia, Rt reduced, Lt present 07/21/2013  . Stroke   . GERD (gastroesophageal reflux disease)   . H/O hiatal hernia         Objective:   Physical Exam  Vitals reviewed. Constitutional: He is oriented to person, place, and time. He appears well-developed and well-nourished. No distress.  HENT:  Head: Normocephalic and atraumatic.  Eyes: Conjunctivae are normal.  Neck: Normal range of motion.  Cardiovascular: Normal rate, regular rhythm and normal heart sounds.  Exam reveals no gallop and no friction rub.   No murmur heard. Pulses:      Posterior tibial pulses are 2+ on the right side, and 2+ on the left side.  Pulmonary/Chest: Effort normal and breath sounds normal. No respiratory distress. He has no wheezes. He has no rhonchi. He has no rales.  Musculoskeletal: Normal range of motion.  LLE with FROM, mildly tender to palpation along IT band, compartments soft, no calf tenderness, no distal edema, good pulses.  Neurological: He is alert and oriented to person, place, and time. He has normal strength. No sensory deficit.  Skin: Skin is warm and dry.  Psychiatric: He has a normal mood and affect.   Filed Vitals:   09/16/13 0812  BP: 152/80  Pulse: 66  Temp: 96.9 F (36.1  C)       Assessment & Plan:  Leg pain: Low risk of DVT, pain improves throughout day after completion of exercise, TTP of lateral aspect, no edema, good pulses. Presentation consistent with muscular pain. Continue with Tylenol for pain relief.  Rest leg for next week, if no improvement of symptoms f/u with Dr. Tamala Julian.

## 2013-09-16 NOTE — Patient Instructions (Signed)
It was great meeting you today Mr.  Andres [name]!  Continue taking Tylenol for pain relief. Rest the leg for the next week, if no improvement of symptoms please follow up with Dr. Gardenia Phlegm.  Myalgia, Adult Myalgia is the medical term for muscle pain. It is a symptom of many things. Nearly everyone at some time in their life has this. The most common cause for muscle pain is overuse or straining and more so when you are not in shape. Injuries and muscle bruises cause myalgias. Muscle pain without a history of injury can also be caused by a virus. It frequently comes along with the flu. Myalgia not caused by muscle strain can be present in a large number of infectious diseases. Some autoimmune diseases like lupus and fibromyalgia can cause muscle pain. Myalgia may be mild, or severe. SYMPTOMS  The symptoms of myalgia are simply muscle pain. Most of the time this is short lived and the pain goes away without treatment. DIAGNOSIS  Myalgia is diagnosed by your caregiver by taking your history. This means you tell him when the problems began, what they are, and what has been happening. If this has not been a long term problem, your caregiver may want to watch for a while to see what will happen. If it has been long term, they may want to do additional testing. TREATMENT  The treatment depends on what the underlying cause of the muscle pain is. Often anti-inflammatory medications will help. HOME CARE INSTRUCTIONS  If the pain in your muscles came from overuse, slow down your activities until the problems go away.  Myalgia from overuse of a muscle can be treated with alternating hot and cold packs on the muscle affected or with cold for the first couple days. If either heat or cold seems to make things worse, stop their use.  Apply ice to the sore area for 15-20 minutes, 03-04 times per day, while awake for the first 2 days of muscle soreness, or as directed. Put the ice in a plastic bag and place a towel  between the bag of ice and your skin.  Only take over-the-counter or prescription medicines for pain, discomfort, or fever as directed by your caregiver.  Regular gentle exercise may help if you are not active.  Stretching before strenuous exercise can help lower the risk of myalgia. It is normal when beginning an exercise regimen to feel some muscle pain after exercising. Muscles that have not been used frequently will be sore at first. If the pain is extreme, this may mean injury to a muscle. SEEK MEDICAL CARE IF:  You have an increase in muscle pain that is not relieved with medication.  You begin to run a temperature.  You develop nausea and vomiting.  You develop a stiff and painful neck.  You develop a rash.  You develop muscle pain after a tick bite.  You have continued muscle pain while working out even after you are in good condition. SEEK IMMEDIATE MEDICAL CARE IF: Any of your problems are getting worse and medications are not helping. MAKE SURE YOU:   Understand these instructions.  Will watch your condition.  Will get help right away if you are not doing well or get worse. Document Released: 06/12/2006 Document Revised: 10/13/2011 Document Reviewed: 09/01/2006 [facility] Patient Information 2014 [place], [place].

## 2013-09-23 ENCOUNTER — Encounter: Payer: Self-pay | Admitting: Family Medicine

## 2013-09-23 ENCOUNTER — Ambulatory Visit (INDEPENDENT_AMBULATORY_CARE_PROVIDER_SITE_OTHER): Payer: Medicare HMO | Admitting: Family Medicine

## 2013-09-23 VITALS — BP 132/70 | HR 84 | Temp 97.0°F | Resp 18 | Wt 229.0 lb

## 2013-09-23 DIAGNOSIS — M629 Disorder of muscle, unspecified: Secondary | ICD-10-CM

## 2013-09-23 DIAGNOSIS — M7630 Iliotibial band syndrome, unspecified leg: Secondary | ICD-10-CM

## 2013-09-23 MED ORDER — MELOXICAM 15 MG PO TABS: 15.0000 mg | ORAL_TABLET | Freq: Every day | ORAL | Status: DC

## 2013-09-23 NOTE — Progress Notes (Signed)
  [facility] [address] [place], [place] [address] Phone: [phone] Subjective:    I'm seeing this patient by the request  of:  Cathlean Cower, MD Stacy Gardner.   CC: Left leg pain  [id number] GILVERTO Andres [name] is a 73 y.o. male coming in with complaint of left leg pain after sitting for significant amount of time. Patient states that this started approximately 2 months ago and seems to be getting somewhat worse. Patient states most of the pain is on the lateral aspect the leg going from his hip down to his knee but never pass his knee. Patient denies any back pain out of the ordinary. Patient does notice the discomfort especially after using a rowing machine for 30-45 minutes. Patient continues to try to stay extremely active. The patient has not tried any home remedies at this time. Patient denies any weakness and denies any nighttime awakening. Patient rates the severity is 6/10.     Past medical history, social, surgical and family history all reviewed in electronic medical record.   Review of Systems: No headache, visual changes, nausea, vomiting, diarrhea, constipation, dizziness, abdominal pain, skin rash, fevers, chills, night sweats, weight loss, swollen lymph nodes, body aches, joint swelling, muscle aches, chest pain, shortness of breath, mood changes.   Objective Blood pressure 132/70, pulse 84, temperature 97 F (36.1 C), temperature source Oral, resp. rate 18, weight 229 lb 0.6 oz (103.892 kg), SpO2 94.00%.  General: No apparent distress alert and oriented x3 mood and affect normal, dressed appropriately. obese HEENT: Pupils equal, extraocular movements intact  Respiratory: Patient's speak in full sentences and does not appear short of breath  Cardiovascular: No lower extremity edema, non tender, no erythema  Skin: Warm dry intact with no signs of infection or rash on extremities or on axial skeleton.  Abdomen: Soft nontender obese Neuro:  Cranial nerves II through XII are intact, neurovascularly intact in all extremities with 2+ DTRs and 2+ pulses.  Lymph: No lymphadenopathy of posterior or anterior cervical chain or axillae bilaterally.  Gait normal with good balance and coordination.  MSK:  Non tender with full range of motion and good stability and symmetric strength and tone of shoulders, elbows, wrist,  knee and ankles bilaterally.  Hip: Left ROM IR: 35 Deg, ER: 45 Deg, Flexion: 120 Deg, Extension: 100 Deg, Abduction: 45 Deg, Adduction: 45 Deg Strength IR: 5/5, ER: 5/5, Flexion: 5/5, Extension: 5/5, Abduction: 4/5, Adduction: 5/5 Pelvic alignment unremarkable to inspection and palpation. Standing hip rotation and gait without trendelenburg sign / unsteadiness. Greater trochanter with minimal tenderness to palpation. No tenderness over piriformis. Positive Faber with pain on the lateral aspect of the leg just proximal to the knee. No SI joint tenderness and normal minimal SI movement. Contralateral hip unremarkable    Impression and Recommendations:     This case required medical decision making of moderate complexity.

## 2013-09-23 NOTE — Patient Instructions (Addendum)
It is a pleasure to meet you Try exercises most days of the week Meloxicam daily for 1 week then as needed Ice 20 minutes on side of leg after exercise No rowing with legs for 2 weeks.  Come back and see me again in 3 weeks.

## 2013-09-23 NOTE — Assessment & Plan Note (Signed)
The patient is having iliotibial band syndrome I think. He does explain that some of the kidney and the anterior portion of the leg. Patient back to seems to be benign. Patient given home exercise program, patient has been taking ibuprofen and told him to discontinue this but to start meloxicam daily for 7 days and then discontinue. We did discuss other different medicines which would be safer with his comorbidities but he would like to try this. He understands the possibility of interactions. Discuss icing protocol and given home exercise program to do most days a week. Patient will come back again in 3 weeks for further evaluation. If he continues to have pain on consider doing an injection in the greater trochanteric bursitis as well as formal physical therapy. We'll evaluate again no in 3 weeks.

## 2013-09-23 NOTE — Progress Notes (Signed)
Pre visit review using our clinic review tool, if applicable. No additional management support is needed unless otherwise documented below in the visit note. 

## 2013-10-17 ENCOUNTER — Ambulatory Visit (INDEPENDENT_AMBULATORY_CARE_PROVIDER_SITE_OTHER): Payer: Medicare HMO | Admitting: Family Medicine

## 2013-10-17 ENCOUNTER — Encounter: Payer: Self-pay | Admitting: Family Medicine

## 2013-10-17 VITALS — BP 120/74 | HR 64

## 2013-10-17 DIAGNOSIS — M7630 Iliotibial band syndrome, unspecified leg: Secondary | ICD-10-CM

## 2013-10-17 DIAGNOSIS — M629 Disorder of muscle, unspecified: Secondary | ICD-10-CM

## 2013-10-17 NOTE — Patient Instructions (Signed)
Good to see you Exercises 3 times a week Ice 20 minutes after activity.  Continue the meloxicam as needed only.  Try golf or if not perfect come back again in 6 weeks and we will do an injection.

## 2013-10-17 NOTE — Assessment & Plan Note (Signed)
Patient is 70% better with conservative management. Discuss with him to try to increase strengthening now of the hip abductor's. Patient given other exercises to do at the gym. We discussed continuing exercises 3 times a week that could be beneficial. Patient is trying to attempt golf if he has any pain he'll come back and we'll consider doing an injection at greater trochanteric bursitis as well as formal physical therapy.  Spent greater than 25 minutes with patient face-to-face and had greater than 50% of counseling including as described above in assessment and plan.

## 2013-10-17 NOTE — Progress Notes (Signed)
  [facility] [address] [place], [place] [address] Phone: [phone] Subjective:      CC: Left leg pain follow up  [id number] Andres [name] is a 73 y.o. male coming in with complaint of left leg pain follow up. Patient was seen previously he was diagnosed with a distal ITB an as well as a greater trochanteric bursitis. Patient elected to do conservative therapy with anti-inflammatories, icing protocol, and home exercises. Patient states he is approximately 70% better. Patient states that he is able to ambulate but is nervous about playing golf. Patient denies any significant radiation or any weakness. Patient denies any nighttime awakening. States doing the exercises fairly regularly. No new symptoms.     Past medical history, social, surgical and family history all reviewed in electronic medical record.   Review of Systems: No headache, visual changes, nausea, vomiting, diarrhea, constipation, dizziness, abdominal pain, skin rash, fevers, chills, night sweats, weight loss, swollen lymph nodes, body aches, joint swelling, muscle aches, chest pain, shortness of breath, mood changes.   Objective Blood pressure 120/74, pulse 64, SpO2 97.00%.  General: No apparent distress alert and oriented x3 mood and affect normal, dressed appropriately. obese HEENT: Pupils equal, extraocular movements intact  Respiratory: Patient's speak in full sentences and does not appear short of breath  Cardiovascular: No lower extremity edema, non tender, no erythema  Skin: Warm dry intact with no signs of infection or rash on extremities or on axial skeleton.  Abdomen: Soft nontender obese Neuro: Cranial nerves II through XII are intact, neurovascularly intact in all extremities with 2+ DTRs and 2+ pulses.  Lymph: No lymphadenopathy of posterior or anterior cervical chain or axillae bilaterally.  Gait normal with good balance and coordination.  MSK:  Non tender with full  range of motion and good stability and symmetric strength and tone of shoulders, elbows, wrist,  knee and ankles bilaterally.  Hip: Left ROM IR: 35 Deg, ER: 45 Deg, Flexion: 120 Deg, Extension: 100 Deg, Abduction: 45 Deg, Adduction: 45 Deg Strength IR: 5/5, ER: 5/5, Flexion: 5/5, Extension: 5/5, Abduction: 4/5, Adduction: 5/5 Pelvic alignment unremarkable to inspection and palpation. Standing hip rotation and gait without trendelenburg sign / unsteadiness. Greater trochanter with minimal tenderness to palpation. No tenderness over piriformis. Positive Corky Sox still No SI joint tenderness and normal minimal SI movement. Contralateral hip unremarkable    Impression and Recommendations:     This case required medical decision making of moderate complexity.

## 2013-10-27 ENCOUNTER — Ambulatory Visit: Payer: Medicare Other | Admitting: Internal Medicine

## 2013-10-27 ENCOUNTER — Telehealth: Payer: Self-pay

## 2013-10-27 DIAGNOSIS — M79673 Pain in unspecified foot: Secondary | ICD-10-CM

## 2013-10-27 NOTE — Telephone Encounter (Signed)
Done erx 

## 2013-10-27 NOTE — Telephone Encounter (Signed)
The patient would like a referral to [facility] [place] with Dr. Tana Felts

## 2013-11-02 ENCOUNTER — Ambulatory Visit: Payer: Commercial Managed Care - HMO | Admitting: Internal Medicine

## 2013-11-16 ENCOUNTER — Encounter: Payer: Self-pay | Admitting: Podiatry

## 2013-11-16 ENCOUNTER — Ambulatory Visit: Payer: Self-pay | Admitting: Podiatry

## 2013-11-16 ENCOUNTER — Ambulatory Visit (INDEPENDENT_AMBULATORY_CARE_PROVIDER_SITE_OTHER): Payer: Commercial Managed Care - HMO | Admitting: Podiatry

## 2013-11-16 VITALS — BP 142/83 | HR 56 | Resp 16

## 2013-11-16 DIAGNOSIS — L600 Ingrowing nail: Secondary | ICD-10-CM

## 2013-11-16 DIAGNOSIS — B351 Tinea unguium: Secondary | ICD-10-CM

## 2013-11-16 DIAGNOSIS — M79609 Pain in unspecified limb: Secondary | ICD-10-CM

## 2013-11-16 NOTE — Progress Notes (Signed)
Subjective:     Patient ID: Andres [name], male   DOB: 1940/12/18, 73 y.o.   MRN: [id number]  HPI patient presents stating I have to get my toenails taken care of as they get thick and painful and I can not do them myself. Also has dry skin on his heels   Review of Systems  All other systems reviewed and are negative.      Objective:   Physical Exam  Nursing note and vitals reviewed. Constitutional: He is oriented to person, place, and time.  Cardiovascular: Intact distal pulses.   Musculoskeletal: Normal range of motion.  Neurological: He is oriented to person, place, and time.  Skin: Skin is dry.   neurovascular status intact with muscle strength mildly diminished and range of motion subtalar midtarsal joint mildly diminished patient is found to have thick nailbeds 1-5 both feet which are painful when pressed and difficult for him to wear shoe gear with comfortably they are brittle and there is also some ingrown component     Assessment:     Dermatitis with chronic mycotic nail condition and pain with ingrown toenails    Plan:     H&P performed and conditions discussed with patient. Debridement nailbeds 1-5 both feet was accomplished with no iatrogenic bleeding and patient will be seen back to recheck

## 2013-11-16 NOTE — Progress Notes (Signed)
   Subjective:    Patient ID: Andres [name], male    DOB: 07-Mar-1941, 73 y.o.   MRN: [id number]  HPI Comments: "I need the toenails trimmed"  Patient states that he gets his toenails trimmed regularly, every 3 months. He was going to [place] to have trimmed but insurance changed and now had to find new provider. Toenails are thick and discolored.      Review of Systems  Respiratory: Positive for cough.   Cardiovascular: Positive for leg swelling.  Musculoskeletal: Positive for myalgias.  All other systems reviewed and are negative.      Objective:   Physical Exam        Assessment & Plan:

## 2013-11-16 NOTE — Patient Instructions (Signed)
Diabetes and Foot Care Diabetes may cause you to have problems because of poor blood supply (circulation) to your feet and legs. This may cause the skin on your feet to become thinner, break easier, and heal more slowly. Your skin may become dry, and the skin may peel and crack. You may also have nerve damage in your legs and feet causing decreased feeling in them. You may not notice minor injuries to your feet that could lead to infections or more serious problems. Taking care of your feet is one of the most important things you can do for yourself.  HOME CARE INSTRUCTIONS  Wear shoes at all times, even in the house. Do not go barefoot. Bare feet are easily injured.  Check your feet daily for blisters, cuts, and redness. If you cannot see the bottom of your feet, use a mirror or ask someone for help.  Wash your feet with warm water (do not use hot water) and mild soap. Then pat your feet and the areas between your toes until they are completely dry. Do not soak your feet as this can dry your skin.  Apply a moisturizing lotion or petroleum jelly (that does not contain alcohol and is unscented) to the skin on your feet and to dry, brittle toenails. Do not apply lotion between your toes.  Trim your toenails straight across. Do not dig under them or around the cuticle. File the edges of your nails with an emery board or nail file.  Do not cut corns or calluses or try to remove them with medicine.  Wear clean socks or stockings every day. Make sure they are not too tight. Do not wear knee-high stockings since they may decrease blood flow to your legs.  Wear shoes that fit properly and have enough cushioning. To break in new shoes, wear them for just a few hours a day. This prevents you from injuring your feet. Always look in your shoes before you put them on to be sure there are no objects inside.  Do not cross your legs. This may decrease the blood flow to your feet.  If you find a minor scrape,  cut, or break in the skin on your feet, keep it and the skin around it clean and dry. These areas may be cleansed with mild soap and water. Do not cleanse the area with peroxide, alcohol, or iodine.  When you remove an adhesive bandage, be sure not to damage the skin around it.  If you have a wound, look at it several times a day to make sure it is healing.  Do not use heating pads or hot water bottles. They may burn your skin. If you have lost feeling in your feet or legs, you may not know it is happening until it is too late.  Make sure your health care provider performs a complete foot exam at least annually or more often if you have foot problems. Report any cuts, sores, or bruises to your health care provider immediately. SEEK MEDICAL CARE IF:   You have an injury that is not healing.  You have cuts or breaks in the skin.  You have an ingrown nail.  You notice redness on your legs or feet.  You feel burning or tingling in your legs or feet.  You have pain or cramps in your legs and feet.  Your legs or feet are numb.  Your feet always feel cold. SEEK IMMEDIATE MEDICAL CARE IF:   There is increasing redness,   swelling, or pain in or around a wound.  There is a red line that goes up your leg.  Pus is coming from a wound.  You develop a fever or as directed by your health care provider.  You notice a bad smell coming from an ulcer or wound. Document Released: 07/18/2000 Document Revised: 03/23/2013 Document Reviewed: 12/28/2012 [employer] Patient Information 2014 [employer].  

## 2013-11-24 ENCOUNTER — Encounter: Payer: Self-pay | Admitting: Internal Medicine

## 2013-11-24 ENCOUNTER — Ambulatory Visit (INDEPENDENT_AMBULATORY_CARE_PROVIDER_SITE_OTHER): Payer: Commercial Managed Care - HMO | Admitting: Internal Medicine

## 2013-11-24 ENCOUNTER — Ambulatory Visit (INDEPENDENT_AMBULATORY_CARE_PROVIDER_SITE_OTHER): Payer: Commercial Managed Care - HMO | Admitting: Family Medicine

## 2013-11-24 ENCOUNTER — Encounter: Payer: Self-pay | Admitting: Family Medicine

## 2013-11-24 ENCOUNTER — Other Ambulatory Visit (INDEPENDENT_AMBULATORY_CARE_PROVIDER_SITE_OTHER): Payer: Commercial Managed Care - HMO

## 2013-11-24 VITALS — BP 122/72 | HR 68 | Temp 97.4°F | Ht 66.0 in | Wt 227.0 lb

## 2013-11-24 VITALS — BP 140/90 | HR 80

## 2013-11-24 DIAGNOSIS — I341 Nonrheumatic mitral (valve) prolapse: Secondary | ICD-10-CM

## 2013-11-24 DIAGNOSIS — IMO0001 Reserved for inherently not codable concepts without codable children: Secondary | ICD-10-CM

## 2013-11-24 DIAGNOSIS — Z0000 Encounter for general adult medical examination without abnormal findings: Secondary | ICD-10-CM

## 2013-11-24 DIAGNOSIS — E119 Type 2 diabetes mellitus without complications: Secondary | ICD-10-CM

## 2013-11-24 DIAGNOSIS — M7630 Iliotibial band syndrome, unspecified leg: Secondary | ICD-10-CM

## 2013-11-24 DIAGNOSIS — E1165 Type 2 diabetes mellitus with hyperglycemia: Secondary | ICD-10-CM

## 2013-11-24 DIAGNOSIS — M629 Disorder of muscle, unspecified: Secondary | ICD-10-CM

## 2013-11-24 HISTORY — DX: Nonrheumatic mitral (valve) prolapse: I34.1

## 2013-11-24 LAB — CBC WITH DIFFERENTIAL/PLATELET
Basophils Absolute: 0 10*3/uL (ref 0.0–0.1)
Basophils Relative: 0.4 % (ref 0.0–3.0)
EOS ABS: 0.1 10*3/uL (ref 0.0–0.7)
EOS PCT: 2.9 % (ref 0.0–5.0)
HCT: 39.6 % (ref 39.0–52.0)
Hemoglobin: 13.3 g/dL (ref 13.0–17.0)
Lymphs Abs: 2.1 10*3/uL (ref 0.7–4.0)
MCHC: 33.6 g/dL (ref 30.0–36.0)
MCV: 99 fl (ref 78.0–100.0)
Monocytes Absolute: 0.4 10*3/uL (ref 0.1–1.0)
Monocytes Relative: 10.2 % (ref 3.0–12.0)
NEUTROS PCT: 30.8 % — AB (ref 43.0–77.0)
Neutro Abs: 1.2 10*3/uL — ABNORMAL LOW (ref 1.4–7.7)
Platelets: 167 10*3/uL (ref 150.0–400.0)
RBC: 4 Mil/uL — AB (ref 4.22–5.81)
RDW: 14.5 % (ref 11.5–14.6)

## 2013-11-24 LAB — BASIC METABOLIC PANEL
BUN: 13 mg/dL (ref 6–23)
CO2: 33 meq/L — AB (ref 19–32)
CREATININE: 1.2 mg/dL (ref 0.4–1.5)
Calcium: 9.6 mg/dL (ref 8.4–10.5)
Chloride: 102 mEq/L (ref 96–112)
GFR: 75.9 mL/min (ref >60.00)
Glucose, Bld: 90 mg/dL (ref 70–99)
Potassium: 4.9 mEq/L (ref 3.5–5.1)
Sodium: 139 mEq/L (ref 135–145)

## 2013-11-24 LAB — URINALYSIS, ROUTINE W REFLEX MICROSCOPIC
Bilirubin Urine: NEGATIVE
HGB URINE DIPSTICK: NEGATIVE
Ketones, ur: NEGATIVE
Leukocytes, UA: NEGATIVE
Nitrite: NEGATIVE
Specific Gravity, Urine: 1.01 (ref 1.000–1.030)
TOTAL PROTEIN, URINE-UPE24: NEGATIVE
Urine Glucose: NEGATIVE
Urobilinogen, UA: 0.2 (ref 0.0–1.0)
pH: 7 (ref 5.0–8.0)

## 2013-11-24 LAB — PSA: PSA: 0.4 ng/mL (ref 0.10–4.00)

## 2013-11-24 LAB — HEMOGLOBIN A1C: Hgb A1c MFr Bld: 6.1 % (ref 4.6–6.5)

## 2013-11-24 LAB — TSH: TSH: 0.9 u[IU]/mL (ref 0.35–5.50)

## 2013-11-24 LAB — HEPATIC FUNCTION PANEL
ALBUMIN: 4.1 g/dL (ref 3.5–5.2)
ALT: 22 U/L (ref 0–53)
AST: 31 U/L (ref 0–37)
Alkaline Phosphatase: 80 U/L (ref 39–117)
Bilirubin, Direct: 0.2 mg/dL (ref 0.0–0.3)
TOTAL PROTEIN: 6.6 g/dL (ref 6.0–8.3)
Total Bilirubin: 1 mg/dL (ref 0.3–1.2)

## 2013-11-24 LAB — LIPID PANEL
CHOLESTEROL: 107 mg/dL (ref 0–200)
HDL: 45 mg/dL (ref >39.00)
LDL Cholesterol: 54 mg/dL (ref 0–99)
TRIGLYCERIDES: 40 mg/dL (ref 0.0–149.0)
Total CHOL/HDL Ratio: 2
VLDL: 8 mg/dL (ref 0.0–40.0)

## 2013-11-24 LAB — MICROALBUMIN / CREATININE URINE RATIO
Creatinine,U: 108.5 mg/dL
MICROALB/CREAT RATIO: 0.4 mg/g (ref 0.0–30.0)
Microalb, Ur: 0.4 mg/dL (ref 0.0–1.9)

## 2013-11-24 NOTE — Patient Instructions (Addendum)
Please continue all other medications as before, and refills have been done if requested. Please have the pharmacy call with any other refills you may need.  Please continue your efforts at being more active, low cholesterol diet, and weight control. You are otherwise up to date with prevention measures today.  You will be contacted regarding the referral for: podiatry, and colonoscopy  Please go to the LAB in the Basement (turn left off the elevator) for the tests to be done today You will be contacted by phone if any changes need to be made immediately.  Otherwise, you will receive a letter about your results with an explanation, but please check with MyChart first.  Please remember to sign up for MyChart if you have not done so, as this will be important to you in the future with finding out test results, communicating by private email, and scheduling acute appointments online when needed.  Please return in 6 months, or sooner if needed

## 2013-11-24 NOTE — Patient Instructions (Signed)
Good to see you OK to start elliptical if you want Continue exercises or stretches at end of workouts.  Continue, biking, water and golf If pain gets worse at any point you know where I am and we can take care of it.

## 2013-11-24 NOTE — Progress Notes (Signed)
  [name] [address], [place] [address] Phone: [phone] Subjective:      CC: Left leg pain follow up  [id number] Andres [name] is a 73 y.o. male coming in with complaint of left leg pain follow up. Patient has been doing greater trochanteric bursitis. Patient continues to do biking as well as exercises on a regular basis as well as some water aerobics and golfing 2 times a week. Overall patient has been feeling significantly good. Patient states that he is about 15-20% better than even last visit. Patient states that this pain is not stopping any activities daily living and is not causing any nighttime awakening. Patient is very happy with the results at this time.    Past medical history, social, surgical and family history all reviewed in electronic medical record.   Review of Systems: No headache, visual changes, nausea, vomiting, diarrhea, constipation, dizziness, abdominal pain, skin rash, fevers, chills, night sweats, weight loss, swollen lymph nodes, body aches, joint swelling, muscle aches, chest pain, shortness of breath, mood changes.   Objective There were no vitals taken for this visit.  General: No apparent distress alert and oriented x3 mood and affect normal, dressed appropriately. obese HEENT: Pupils equal, extraocular movements intact  Respiratory: Patient's speak in full sentences and does not appear short of breath  Cardiovascular: No lower extremity edema, non tender, no erythema  Skin: Warm dry intact with no signs of infection or rash on extremities or on axial skeleton.  Abdomen: Soft nontender obese Neuro: Cranial nerves II through XII are intact, neurovascularly intact in all extremities with 2+ DTRs and 2+ pulses.  Lymph: No lymphadenopathy of posterior or anterior cervical chain or axillae bilaterally.  Gait normal with good balance and coordination.  MSK:  Non tender with full range of motion and good  stability and symmetric strength and tone of shoulders, elbows, wrist,  knee and ankles bilaterally.  Hip: Left ROM IR: 35 Deg, ER: 45 Deg, Flexion: 120 Deg, Extension: 100 Deg, Abduction: 45 Deg, Adduction: 45 Deg Strength IR: 5/5, ER: 5/5, Flexion: 5/5, Extension: 5/5, Abduction: 4/5, Adduction: 5/5 Pelvic alignment unremarkable to inspection and palpation. Standing hip rotation and gait without trendelenburg sign / unsteadiness. Greater trochanter with minimal tenderness to palpation. No change from previous exam No tenderness over piriformis. Positive Corky Sox still No SI joint tenderness and normal minimal SI movement. Contralateral hip unremarkable    Impression and Recommendations:     This case required medical decision making of moderate complexity.

## 2013-11-24 NOTE — Progress Notes (Signed)
Pre visit review using our clinic review tool, if applicable. No additional management support is needed unless otherwise documented below in the visit note. 

## 2013-11-24 NOTE — Assessment & Plan Note (Signed)

## 2013-11-24 NOTE — Assessment & Plan Note (Signed)
Discussed with patient again in great length. Patient is doing very well with conservative therapy and will continue. Patient will continue exercises 3 times a week and we discussed icing protocol. Patient continues to do well we'll continue with conservative therapy otherwise he'll come back for injection of the greater trochanteric area.

## 2013-11-24 NOTE — Progress Notes (Signed)
Subjective:    Patient ID: Andres [name], male    DOB: 1941/02/13, 73 y.o.   MRN: [id number]  HPI  Here for wellness and f/u;  Overall doing ok;  Pt denies CP, worsening SOB, DOE, wheezing, orthopnea, PND, worsening LE edema, palpitations, dizziness or syncope.  Pt denies neurological change such as new headache, facial or extremity weakness.  Pt denies polydipsia, polyuria, or low sugar symptoms. Pt states overall good compliance with treatment and medications, good tolerability, and has been trying to follow lower cholesterol diet.  Pt denies worsening depressive symptoms, suicidal ideation or panic. No fever, night sweats, wt loss, loss of appetite, or other constitutional symptoms.  Pt states good ability with ADL's, has low fall risk, home safety reviewed and adequate, no other significant changes in hearing or vision, and trying to stay active with exercise, goes to gym 4 days/wk, also golf some days. Has recent IT band now about 75% better, has f/u with Dr Tamala Julian today. No other current complaints. Past Medical History  Diagnosis Date  . Hyperlipidemia   . Hypertension   . Hepatitis   . Obesity, morbid   . Benign prostatic hypertrophy   . Cataracts, bilateral   . Carpal tunnel syndrome, bilateral   . Diverticulosis of colon   . Nephrolithiasis   . Low back pain   . ALLERGIC RHINITIS   . Head mass   . Diabetes mellitus type II   . Coronary artery disease     Dr. Claiborne Billings; 2D ECHO, 12/11/2011 - EF 50-55%, normal; NUCLEAR STRESS TEST, 10/16/2010 - no evidemce of inducible ischemia  . 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 Last nuc 2012 negative. 07/21/2013  . Inguinal hernia, Rt reduced, Lt present 07/21/2013  . Stroke   . GERD (gastroesophageal reflux disease)   . H/O hiatal hernia   . Mitral valve prolapse  11/24/2013    By echo dec 2014   Past Surgical History  Procedure Laterality Date  . Appendectomy    . Coronary artery bypass graft    . Cardiac catheterization    . Cataract extraction Right   . Inguinal hernia repair Right 07/26/2013    Procedure: REPAIR  INCARCERATED RIGHT INGUINAL  HERNIA ;  Surgeon: Gwenyth Ober, MD;  Location: [facility];  Service: General;  Laterality: Right;    reports that he has quit smoking. He does not have any smokeless tobacco history on file. He reports that he does not drink alcohol or use illicit drugs. family history includes Alzheimer's disease in his father; Cancer in his brother; Heart attack in his brother, maternal grandmother, and mother; Hypertension in his maternal grandmother and son; Kidney disease in his brother and son. No Known Allergies Current Outpatient Prescriptions on File Prior to Visit  Medication Sig Dispense Refill  . acetaminophen (TYLENOL) 325 MG tablet Take 325 mg by mouth every 6 (six) hours as needed for mild pain.      . Ascorbic Acid (VITAMIN C) 1000 MG tablet Take 1,000 mg by mouth daily.      . Aspirin (ADULT ASPIRIN LOW STRENGTH) 81 MG EC tablet Take 81 mg by mouth daily.        Marland Kitchen atorvastatin (LIPITOR) 40 MG tablet Take 1 tablet (40 mg total) by mouth daily.  90 tablet  3  .  clopidogrel (PLAVIX) 75 MG tablet Take 1 tablet (75 mg total) by mouth daily.  90 tablet  3  . diclofenac sodium (VOLTAREN) 1 % GEL Apply 2 g topically 4 (four) times daily as needed (for arthritis).      . folic acid (FOLVITE) 1 MG tablet Take 1 tablet (1 mg total) by mouth daily.  90 tablet  3  . losartan-hydrochlorothiazide (HYZAAR) 50-12.5 MG per tablet Take 1 tablet by mouth daily.  90 tablet  3  . meloxicam (MOBIC) 15 MG tablet Take 1 tablet (15 mg total) by mouth daily.  30 tablet  0  . metoprolol (LOPRESSOR) 50 MG tablet Take 1 tablet (50 mg total) by mouth 2 (two) times daily.  180 tablet  3  . Multiple Vitamin (MULTIVITAMIN) capsule Take 1 capsule  by mouth daily.       . niacin (NIASPAN) 750 MG CR tablet Take 750 mg by mouth at bedtime.      . Omega-3 Fatty Acids (OMEGA 3 PO) Take 360 mg by mouth 2 (two) times daily.      . tamsulosin (FLOMAX) 0.4 MG CAPS capsule Take 1 capsule (0.4 mg total) by mouth daily.  90 capsule  3   No current facility-administered medications on file prior to visit.   Review of Systems Constitutional: Negative for diaphoresis, activity change, appetite change or unexpected weight change.  HENT: Negative for hearing loss, ear pain, facial swelling, mouth sores and neck stiffness.   Eyes: Negative for pain, redness and visual disturbance.  Respiratory: Negative for shortness of breath and wheezing.   Cardiovascular: Negative for chest pain and palpitations.  Gastrointestinal: Negative for diarrhea, blood in stool, abdominal distention or other pain Genitourinary: Negative for hematuria, flank pain or change in urine volume.  Musculoskeletal: Negative for myalgias and joint swelling.  Skin: Negative for color change and wound.  Neurological: Negative for syncope and numbness. other than noted Hematological: Negative for adenopathy.  Psychiatric/Behavioral: Negative for hallucinations, self-injury, decreased concentration and agitation.      Objective:   Physical Exam BP 122/72  Pulse 68  Temp(Src) 97.4 F (36.3 C) (Oral)  Ht 5\' 6"  (1.676 m)  Wt 227 lb (102.967 kg)  BMI 36.66 kg/m2 VS noted,  Constitutional: Pt is oriented to person, place, and time. Appears well-developed and well-nourished.  Head: Normocephalic and atraumatic.  Right Ear: External ear normal.  Left Ear: External ear normal.  Nose: Nose normal.  Mouth/Throat: Oropharynx is clear and moist.  Eyes: Conjunctivae and EOM are normal. Pupils are equal, round, and reactive to light.  Neck: Normal range of motion. Neck supple. No JVD present. No tracheal deviation present.  Cardiovascular: Normal rate, regular rhythm, normal heart sounds  and intact distal pulses.   Pulmonary/Chest: Effort normal and breath sounds normal.  Abdominal: Soft. Bowel sounds are normal. There is no tenderness. No HSM  Musculoskeletal: Normal range of motion. Exhibits no edema.  Lymphadenopathy:  Has no cervical adenopathy.  Neurological: Pt is alert and oriented to person, place, and time. Pt has normal reflexes. No cranial nerve deficit.  Skin: Skin is warm and dry. No rash noted.  Psychiatric:  Has  normal mood and affect. Behavior is normal.     Assessment & Plan:

## 2013-11-24 NOTE — Assessment & Plan Note (Signed)
stable overall by history and exam, recent data reviewed with pt, and pt to continue medical treatment as before,  to f/u any worsening symptoms or concerns le Lab Results  Component Value Date   HGBA1C 6.4* 07/21/2013

## 2013-12-01 ENCOUNTER — Ambulatory Visit: Payer: Medicare HMO | Admitting: Family Medicine

## 2013-12-01 ENCOUNTER — Ambulatory Visit: Payer: Medicare HMO | Admitting: Internal Medicine

## 2014-02-23 ENCOUNTER — Ambulatory Visit (INDEPENDENT_AMBULATORY_CARE_PROVIDER_SITE_OTHER): Payer: Commercial Managed Care - HMO | Admitting: Podiatry

## 2014-02-23 DIAGNOSIS — M79609 Pain in unspecified limb: Secondary | ICD-10-CM

## 2014-02-23 DIAGNOSIS — B351 Tinea unguium: Secondary | ICD-10-CM

## 2014-02-23 DIAGNOSIS — M79673 Pain in unspecified foot: Secondary | ICD-10-CM

## 2014-02-23 NOTE — Patient Instructions (Signed)
Diabetes and Foot Care Diabetes may cause you to have problems because of poor blood supply (circulation) to your feet and legs. This may cause the skin on your feet to become thinner, break easier, and heal more slowly. Your skin may become dry, and the skin may peel and crack. You may also have nerve damage in your legs and feet causing decreased feeling in them. You may not notice minor injuries to your feet that could lead to infections or more serious problems. Taking care of your feet is one of the most important things you can do for yourself.  HOME CARE INSTRUCTIONS  Wear shoes at all times, even in the house. Do not go barefoot. Bare feet are easily injured.  Check your feet daily for blisters, cuts, and redness. If you cannot see the bottom of your feet, use a mirror or ask someone for help.  Wash your feet with warm water (do not use hot water) and mild soap. Then pat your feet and the areas between your toes until they are completely dry. Do not soak your feet as this can dry your skin.  Apply a moisturizing lotion or petroleum jelly (that does not contain alcohol and is unscented) to the skin on your feet and to dry, brittle toenails. Do not apply lotion between your toes.  Trim your toenails straight across. Do not dig under them or around the cuticle. File the edges of your nails with an emery board or nail file.  Do not cut corns or calluses or try to remove them with medicine.  Wear clean socks or stockings every day. Make sure they are not too tight. Do not wear knee-high stockings since they may decrease blood flow to your legs.  Wear shoes that fit properly and have enough cushioning. To break in new shoes, wear them for just a few hours a day. This prevents you from injuring your feet. Always look in your shoes before you put them on to be sure there are no objects inside.  Do not cross your legs. This may decrease the blood flow to your feet.  If you find a minor scrape,  cut, or break in the skin on your feet, keep it and the skin around it clean and dry. These areas may be cleansed with mild soap and water. Do not cleanse the area with peroxide, alcohol, or iodine.  When you remove an adhesive bandage, be sure not to damage the skin around it.  If you have a wound, look at it several times a day to make sure it is healing.  Do not use heating pads or hot water bottles. They may burn your skin. If you have lost feeling in your feet or legs, you may not know it is happening until it is too late.  Make sure your health care provider performs a complete foot exam at least annually or more often if you have foot problems. Report any cuts, sores, or bruises to your health care provider immediately. SEEK MEDICAL CARE IF:   You have an injury that is not healing.  You have cuts or breaks in the skin.  You have an ingrown nail.  You notice redness on your legs or feet.  You feel burning or tingling in your legs or feet.  You have pain or cramps in your legs and feet.  Your legs or feet are numb.  Your feet always feel cold. SEEK IMMEDIATE MEDICAL CARE IF:   There is increasing redness,   swelling, or pain in or around a wound.  There is a red line that goes up your leg.  Pus is coming from a wound.  You develop a fever or as directed by your health care provider.  You notice a bad smell coming from an ulcer or wound. Document Released: 07/18/2000 Document Revised: 03/23/2013 Document Reviewed: 12/28/2012 Exit[employer] Patient Information 2015 [employer]. This information is not intended to replace advice given to you by your health care provider. Make sure you discuss any questions you have with your health care provider.  

## 2014-02-24 NOTE — Progress Notes (Signed)
Subjective:     Patient ID: Andres [name], male   DOB: 1940/12/12, 73 y.o.   MRN: [id number]  HPI patient presents with thick toenails 1-5 both feet that are painful and he cannot cut   Review of Systems     Objective:   Physical Exam Neurovascular status intact with yellow brittle nailbeds 1-5 both feet that are painful when presse    Assessment:     Mycotic nail infection with pain 1-5 both feet    Plan:     Debris painful nailbeds 1-5 both feet with no iatrogenic bleeding noted

## 2014-03-02 ENCOUNTER — Encounter: Payer: Self-pay | Admitting: Gastroenterology

## 2014-03-02 ENCOUNTER — Encounter: Payer: Self-pay | Admitting: Internal Medicine

## 2014-04-05 ENCOUNTER — Encounter: Payer: Self-pay | Admitting: Cardiovascular Disease

## 2014-04-05 ENCOUNTER — Ambulatory Visit: Payer: Commercial Managed Care - HMO | Admitting: Cardiovascular Disease

## 2014-04-05 ENCOUNTER — Ambulatory Visit (INDEPENDENT_AMBULATORY_CARE_PROVIDER_SITE_OTHER): Payer: Commercial Managed Care - HMO | Admitting: Cardiovascular Disease

## 2014-04-05 VITALS — BP 116/62 | HR 65 | Ht 66.0 in | Wt 233.6 lb

## 2014-04-05 DIAGNOSIS — Z8679 Personal history of other diseases of the circulatory system: Secondary | ICD-10-CM

## 2014-04-05 DIAGNOSIS — E119 Type 2 diabetes mellitus without complications: Secondary | ICD-10-CM

## 2014-04-05 DIAGNOSIS — I2510 Atherosclerotic heart disease of native coronary artery without angina pectoris: Secondary | ICD-10-CM

## 2014-04-05 DIAGNOSIS — I10 Essential (primary) hypertension: Secondary | ICD-10-CM

## 2014-04-05 DIAGNOSIS — E785 Hyperlipidemia, unspecified: Secondary | ICD-10-CM

## 2014-04-05 NOTE — Patient Instructions (Signed)
Your physician wants you to follow-up exercise myoview  And office appointment: 6 months You will receive a reminder letter in the mail two months in advance. If you don't receive a letter, please call our office to schedule the follow-up appointment. No changes were made today in your therapy.

## 2014-04-05 NOTE — Progress Notes (Signed)
Patient ID: Andres [name], male   DOB: Sep 10, 1940, 73 y.o.   MRN: [id number]     HPI: Andres [name] is a 73 y.o. male who presents to the office today for a 8 month cardiology evaluation.   Andres [name] has established coronary as well a cerebral vascular disease. In 1997 he underwent CABG revascularization surgery x6 by Dr. Servando Snare and had a LIMA placed to the LAD, sequential vein to the intermediate and second diagonal, vein to the OM 2, sequential vein to the acute margin and PDA. 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 A nuclear perfusion study in March 2012 was unchanged and showed previously noted inferior scar. An echo Doppler study in May 2013 showed mild asymmetric left ventricular hypertrophy with an ejection fraction of 50-55%. There was mild posterior wall hypokinesis with mild inferior wall hypokinesis. He had moderate mitral regurgitation and mild aortic valve sclerosis with mild tricuspid insufficiency.    In December 2014 Andres [name] was hospitalized with renal insufficiency, hypotension, and had mildly positive troponin level. I did review these hospital records. He was found to have small bowel obstruction with right inguinal hernia with herniation of the distal small bowel loop. On December 18 he underwent surgery for this incarcerated right inguinal hernia with small bowel obstruction by Dr. Hulen Skains.  Since I last saw him in January 2015.  He has continued to remain stable.  He has been on losartan, HCT 50/12.5, as well as metoprolol 50 twice a day for blood pressure control.  He is on aspirin and Plavix for dual antiplatelet therapy.  He has been on Niaspan 750 mg in addition to omega-3 fatty acid for his hyperlipidemia.  I did review lab work that he had done 4 months ago.  His cholesterol is 107, triglycerides 40 HDL 45, LDL 54.  Hemoglobin A1c was 6.1.  He had normal renal function with a BUN of 13 and creatinine 1.2.  Normal LFTs.  He did have mildly low  white blood count of 3.8 with a hemoglobin of 13.3, hematocrit 39.6.  He has remained active.  He denies change in exercise tolerance.  He denies PND or orthopnea.  He denies any sequelae from his June 2009 right middle cerebral artery CVA.  Past Medical History  Diagnosis Date  . Hyperlipidemia   . Hypertension   . Hepatitis   . Obesity, morbid   . Benign prostatic hypertrophy   . Cataracts, bilateral   . Carpal tunnel syndrome, bilateral   . Diverticulosis of colon   . Nephrolithiasis   . Low back pain   . ALLERGIC RHINITIS   . Head mass   . Diabetes mellitus type II   . Coronary artery disease     Dr. Claiborne Billings; 2D ECHO, 12/11/2011 - EF 50-55%, normal; NUCLEAR STRESS TEST, 10/16/2010 - no evidemce of inducible ischemia  . 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 Last nuc 2012 negative. 07/21/2013  . Inguinal hernia, Rt reduced, Lt present 07/21/2013  . Stroke   . GERD (gastroesophageal reflux disease)   . H/O hiatal hernia   . Mitral valve prolapse 11/24/2013    By echo dec 2014    Past Surgical History  Procedure Laterality Date  . Appendectomy    . Coronary artery bypass graft    .  Cardiac catheterization    . Cataract extraction Right   . Inguinal hernia repair Right 07/26/2013    Procedure: REPAIR  INCARCERATED RIGHT INGUINAL  HERNIA ;  Surgeon: Gwenyth Ober, MD;  Location: [facility];  Service: General;  Laterality: Right;    No Known Allergies  Current Outpatient Prescriptions  Medication Sig Dispense Refill  . acetaminophen (TYLENOL) 325 MG tablet Take 325 mg by mouth every 6 (six) hours as needed for mild pain.      . Ascorbic Acid (VITAMIN C) 1000 MG tablet Take 1,000 mg by mouth daily.      . Aspirin (ADULT ASPIRIN LOW STRENGTH) 81 MG EC tablet Take 81 mg by mouth daily.        Marland Kitchen atorvastatin  (LIPITOR) 40 MG tablet Take 1 tablet (40 mg total) by mouth daily.  90 tablet  3  . clopidogrel (PLAVIX) 75 MG tablet Take 1 tablet (75 mg total) by mouth daily.  90 tablet  3  . diclofenac sodium (VOLTAREN) 1 % GEL Apply 2 g topically as needed (for arthritis).       . folic acid (FOLVITE) 1 MG tablet Take 1 tablet (1 mg total) by mouth daily.  90 tablet  3  . losartan-hydrochlorothiazide (HYZAAR) 50-12.5 MG per tablet Take 1 tablet by mouth daily.  90 tablet  3  . meloxicam (MOBIC) 15 MG tablet Take 15 mg by mouth as needed.      . metoprolol (LOPRESSOR) 50 MG tablet Take 1 tablet (50 mg total) by mouth 2 (two) times daily.  180 tablet  3  . Multiple Vitamin (MULTIVITAMIN) capsule Take 1 capsule by mouth daily.       . niacin (NIASPAN) 750 MG CR tablet Take 750 mg by mouth at bedtime.      . Omega-3 Fatty Acids (OMEGA 3 PO) Take 360 mg by mouth 2 (two) times daily.      . tamsulosin (FLOMAX) 0.4 MG CAPS capsule Take 1 capsule (0.4 mg total) by mouth daily.  90 capsule  3   No current facility-administered medications for this visit.    Social history is notable that he is widowed and has 2 children one grandchild. He does try to walk her there is no tobacco or alcohol use.  ROS General: Negative; No fevers, chills, or night sweats; positive for obesity HEENT: Negative; No changes in vision or hearing, sinus congestion, difficulty swallowing Pulmonary: Negative; No cough, wheezing, shortness of breath, hemoptysis Cardiovascular: Negative; No chest pain, presyncope, syncope, palpitations GI: Negative; No nausea, vomiting, diarrhea, or abdominal pain GU: Negative; No dysuria, hematuria, or difficulty voiding Musculoskeletal: Negative; no myalgias, joint pain, or weakness Hematologic/Oncology: Negative; no easy bruising, bleeding Endocrine: Positive for metabolic syndrome; no heat/cold intolerance;  Neuro: Negative; no changes in balance, headaches Skin: Negative; No rashes or skin  lesions Psychiatric: Negative; No behavioral problems, depression Sleep: Negative; No snoring, daytime sleepiness, hypersomnolence, bruxism, restless legs, hypnogognic hallucinations, no cataplexy Other comprehensive 14 point system review is negative.  PE BP 116/62  Pulse 65  Ht 5\' 6"  (1.676 m)  Wt 233 lb 9.6 oz (105.96 kg)  BMI 37.72 kg/m2  General: Alert, oriented, no distress.  HEENT: Normocephalic, atraumatic. Pupils round and reactive; sclera anicteric;no lid lag,  Nose without nasal septal hypertrophy Mouth/Parynx benign; Mallinpatti scale 3 Neck: No JVD, no carotid bruits with normal carotid up stroke. Lungs: clear to ausculatation and percussion; no wheezing or rales Chest wall: No tenderness to palpation Heart: RRR,  s1 s2 normal 1/6 systolic murmur; no S3 gallop. No rub. Abdomen: Moderate central adiposity; soft, nontender; no hepatosplenomehaly, BS+; abdominal aorta nontender and not dilated by palpation. Back: No CVA tenderness Pulses 2+ Extremities: no clubbing cyanosis or edema, Homan's sign negative  Neurologic: grossly nonfocal Psychological: Normal affect and mood.  ECG: (Independently read by me): Normal sinus rhythm with sinus arrhythmia.  QTc interval 422 ms.  Inferolateral T wave abnormality.  08/15/2013 ECG: (Independently read by me): Normal sinus rhythm with mild sinus arrhythmia. Nonspecific T changes inferolaterally. Early repolarization. Normal intervals. LABS:  BMET    Component Value Date/Time   NA 139 11/24/2013 1006   K 4.9 11/24/2013 1006   CL 102 11/24/2013 1006   CO2 33* 11/24/2013 1006   GLUCOSE 90 11/24/2013 1006   BUN 13 11/24/2013 1006   CREATININE 1.2 11/24/2013 1006   CALCIUM 9.6 11/24/2013 1006   GFRNONAA 52* 08/01/2013 0345   GFRAA 60* 08/01/2013 0345     Hepatic Function Panel     Component Value Date/Time   PROT 6.6 11/24/2013 1006   ALBUMIN 4.1 11/24/2013 1006   AST 31 11/24/2013 1006   ALT 22 11/24/2013 1006   ALKPHOS 80  11/24/2013 1006   BILITOT 1.0 11/24/2013 1006   BILIDIR 0.2 11/24/2013 1006     CBC    Component Value Date/Time   WBC 3.8 Repeated and verified X2.* 11/24/2013 1006   RBC 4.00* 11/24/2013 1006   HGB 13.3 11/24/2013 1006   HCT 39.6 11/24/2013 1006   PLT 167.0 11/24/2013 1006   MCV 99.0 11/24/2013 1006   MCH 32.8 08/01/2013 0345   MCHC 33.6 11/24/2013 1006   RDW 14.5 11/24/2013 1006   LYMPHSABS 2.1 11/24/2013 1006   MONOABS 0.4 11/24/2013 1006   EOSABS 0.1 11/24/2013 1006   BASOSABS 0.0 11/24/2013 1006     BNP No results found for this basename: probnp    Lipid Panel     Component Value Date/Time   CHOL 107 11/24/2013 1006   TRIG 40.0 11/24/2013 1006   HDL 45.00 11/24/2013 1006   CHOLHDL 2 11/24/2013 1006   VLDL 8.0 11/24/2013 1006   LDLCALC 54 11/24/2013 1006     RADIOLOGY: No results found.    ASSESSMENT AND PLAN: Mr. Odenthal is 16 years status post CABG revascularization surgery. His last stress test in March 2012 was unchanged from previously and revealed inferior scar without associated ischemia.  He was hypotensive in December when he was hospitalized with an incarcerated hernia leading to small bowel obstruction and underwent surgery by Dr. Hulen Skains. He also did have renal insufficiency.  When I saw him in January I reduced his losartan HCTZ from 100/12.550/12.5.  His blood pressure today is well controlled as reduced regimen.  He's not having any signs of edema.  He's not having anginal symptoms.  He is on dual antiplatelet therapy without bleeding.  Reviewed his laboratory.  Lipid studies are excellent on current regimen.  His last nuclear perfusion study was in March 2012.  I'll see him in 6 months for followup evaluation and prior to that office visit, you when you're 2 year exercise Myoview study, which would be almost 19 years since his CABG revascularization surgery to assess for continued graft patency, scar/ischemia.  Troy Sine, MD, [facility]  04/05/2014 6:07 PM

## 2014-04-12 ENCOUNTER — Telehealth: Payer: Self-pay | Admitting: Internal Medicine

## 2014-04-12 DIAGNOSIS — H269 Unspecified cataract: Secondary | ICD-10-CM

## 2014-04-12 NOTE — Telephone Encounter (Signed)
Referral done

## 2014-04-12 NOTE — Telephone Encounter (Signed)
Pt request referral to Dr. Gershon Crane eye doctor for check up for his cataract surgery. Please advise.

## 2014-05-17 ENCOUNTER — Ambulatory Visit (INDEPENDENT_AMBULATORY_CARE_PROVIDER_SITE_OTHER): Payer: Commercial Managed Care - HMO | Admitting: *Deleted

## 2014-05-17 DIAGNOSIS — Z23 Encounter for immunization: Secondary | ICD-10-CM

## 2014-05-17 LAB — HM DIABETES EYE EXAM

## 2014-05-18 ENCOUNTER — Encounter: Payer: Self-pay | Admitting: Internal Medicine

## 2014-05-18 LAB — HM DIABETES EYE EXAM

## 2014-05-29 ENCOUNTER — Ambulatory Visit (INDEPENDENT_AMBULATORY_CARE_PROVIDER_SITE_OTHER): Payer: Commercial Managed Care - HMO | Admitting: Podiatry

## 2014-05-29 DIAGNOSIS — M79673 Pain in unspecified foot: Secondary | ICD-10-CM

## 2014-05-29 DIAGNOSIS — B351 Tinea unguium: Secondary | ICD-10-CM

## 2014-05-29 NOTE — Progress Notes (Signed)
   Subjective:    Patient ID: Andres [name], male    DOB: [birth year], 73 y.o.   MRN: [id number]  HPI  Pt presents for nail debridement  Review of Systems     Objective:   Physical Exam        Assessment & Plan:

## 2014-05-29 NOTE — Progress Notes (Signed)
Subjective:     Patient ID: Andres [name], male   DOB: [birth year], 73 y.o.   MRN: [id number]  HPI patient presents with nail disease and thickness with inability to cut them himself 1-5 of both feet   Review of Systems     Objective:   Physical Exam Her vascular status intact no other changes in health with thick yellow brittle nailbeds 1-5 both feet    Assessment:     Mycotic nail infection 1-5 both feet with pain    Plan:     Debride painful nailbeds 1-5 both feet with no bleeding noted

## 2014-05-31 ENCOUNTER — Other Ambulatory Visit (INDEPENDENT_AMBULATORY_CARE_PROVIDER_SITE_OTHER): Payer: Commercial Managed Care - HMO

## 2014-05-31 ENCOUNTER — Ambulatory Visit (INDEPENDENT_AMBULATORY_CARE_PROVIDER_SITE_OTHER): Payer: Commercial Managed Care - HMO | Admitting: Internal Medicine

## 2014-05-31 ENCOUNTER — Encounter: Payer: Self-pay | Admitting: Internal Medicine

## 2014-05-31 VITALS — BP 120/78 | HR 58 | Temp 98.4°F | Ht 66.0 in | Wt 233.0 lb

## 2014-05-31 DIAGNOSIS — I10 Essential (primary) hypertension: Secondary | ICD-10-CM

## 2014-05-31 DIAGNOSIS — E119 Type 2 diabetes mellitus without complications: Secondary | ICD-10-CM

## 2014-05-31 DIAGNOSIS — E785 Hyperlipidemia, unspecified: Secondary | ICD-10-CM

## 2014-05-31 LAB — BASIC METABOLIC PANEL
BUN: 14 mg/dL (ref 6–23)
CO2: 33 mEq/L — ABNORMAL HIGH (ref 19–32)
CREATININE: 1.2 mg/dL (ref 0.4–1.5)
Calcium: 9.2 mg/dL (ref 8.4–10.5)
Chloride: 103 mEq/L (ref 96–112)
GFR: 72.98 mL/min (ref >60.00)
Glucose, Bld: 86 mg/dL (ref 70–99)
Potassium: 4.2 mEq/L (ref 3.5–5.1)
Sodium: 139 mEq/L (ref 135–145)

## 2014-05-31 LAB — HEPATIC FUNCTION PANEL
ALT: 23 U/L (ref 0–53)
AST: 27 U/L (ref 0–37)
Albumin: 3.6 g/dL (ref 3.5–5.2)
Alkaline Phosphatase: 68 U/L (ref 39–117)
BILIRUBIN TOTAL: 1.2 mg/dL (ref 0.2–1.2)
Bilirubin, Direct: 0.2 mg/dL (ref 0.0–0.3)
Total Protein: 6.8 g/dL (ref 6.0–8.3)

## 2014-05-31 LAB — LIPID PANEL
CHOL/HDL RATIO: 2
Cholesterol: 96 mg/dL (ref 0–200)
HDL: 44.1 mg/dL (ref >39.00)
LDL Cholesterol: 43 mg/dL (ref 0–99)
NONHDL: 51.9
Triglycerides: 45 mg/dL (ref 0.0–149.0)
VLDL: 9 mg/dL (ref 0.0–40.0)

## 2014-05-31 LAB — HEMOGLOBIN A1C: Hgb A1c MFr Bld: 6.3 % (ref 4.6–6.5)

## 2014-05-31 NOTE — Patient Instructions (Signed)

## 2014-05-31 NOTE — Assessment & Plan Note (Signed)
stable overall by history and exam, recent data reviewed with pt, and pt to continue medical treatment as before,  to f/u any worsening symptoms or concerns BP Readings from Last 3 Encounters:  05/31/14 120/78  04/05/14 116/62  11/24/13 140/90

## 2014-05-31 NOTE — Assessment & Plan Note (Signed)
stable overall by history and exam, recent data reviewed with pt, and pt to continue medical treatment as before,  to f/u any worsening symptoms or concerns Lab Results  Component Value Date   HGBA1C 6.1 11/24/2013

## 2014-05-31 NOTE — Progress Notes (Signed)
Subjective:    Patient ID: Andres [name], male    DOB: 05/11/1941, 73 y.o.   MRN: [id number]  HPI  Here to f/u; overall doing ok,  Pt denies chest pain, increased sob or doe, wheezing, orthopnea, PND, increased LE swelling, palpitations, dizziness or syncope.  Pt denies polydipsia, polyuria, or low sugar symptoms such as weakness or confusion improved with po intake.  Pt denies new neurological symptoms such as new headache, or facial or extremity weakness or numbness.   Pt states overall good compliance with meds, has been trying to follow lower cholesterol, diabetic diet, with wt overall stable,  with exercise at gym several times per wk. Past Medical History  Diagnosis Date  . Hyperlipidemia   . Hypertension   . Hepatitis   . Obesity, morbid   . Benign prostatic hypertrophy   . Cataracts, bilateral   . Carpal tunnel syndrome, bilateral   . Diverticulosis of colon   . Nephrolithiasis   . Low back pain   . ALLERGIC RHINITIS   . Head mass   . Diabetes mellitus type II   . Coronary artery disease     Dr. Claiborne Billings; 2D ECHO, 12/11/2011 - EF 50-55%, normal; NUCLEAR STRESS TEST, 10/16/2010 - no evidemce of inducible ischemia  . 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 Last nuc 2012 negative. 07/21/2013  . Inguinal hernia, Rt reduced, Lt present 07/21/2013  . Stroke   . GERD (gastroesophageal reflux disease)   . H/O hiatal hernia   . Mitral valve prolapse 11/24/2013    By echo dec 2014   Past Surgical History  Procedure Laterality Date  . Appendectomy    . Coronary artery bypass graft    . Cardiac catheterization    . Cataract extraction Right   . Inguinal hernia repair Right 07/26/2013    Procedure: REPAIR  INCARCERATED RIGHT INGUINAL  HERNIA ;  Surgeon: Gwenyth Ober, MD;  Location: [place] [facility];  Service: General;   Laterality: Right;    reports that he has quit smoking. He does not have any smokeless tobacco history on file. He reports that he does not drink alcohol or use illicit drugs. family history includes Alzheimer's disease in his father; Cancer in his brother; Heart attack in his brother, maternal grandmother, and mother; Hypertension in his maternal grandmother and son; Kidney disease in his brother and son. No Known Allergies Current Outpatient Prescriptions on File Prior to Visit  Medication Sig Dispense Refill  . acetaminophen (TYLENOL) 325 MG tablet Take 325 mg by mouth every 6 (six) hours as needed for mild pain.      . Ascorbic Acid (VITAMIN C) 1000 MG tablet Take 1,000 mg by mouth daily.      . Aspirin (ADULT ASPIRIN LOW STRENGTH) 81 MG EC tablet Take 81 mg by mouth daily.        Marland Kitchen atorvastatin (LIPITOR) 40 MG tablet Take 1 tablet (40 mg total) by mouth daily.  90 tablet  3  . clopidogrel (PLAVIX) 75 MG tablet Take 1 tablet (75 mg total) by mouth daily.  90 tablet  3  . diclofenac sodium (VOLTAREN) 1 % GEL Apply 2 g topically as needed (for arthritis).       . folic acid (FOLVITE) 1 MG tablet Take 1 tablet (1 mg total) by mouth daily.  90 tablet  3  . losartan-hydrochlorothiazide (HYZAAR) 50-12.5 MG per tablet Take 1 tablet by mouth daily.  90 tablet  3  . meloxicam (MOBIC) 15 MG tablet Take 15 mg by mouth as needed.      . metoprolol (LOPRESSOR) 50 MG tablet Take 1 tablet (50 mg total) by mouth 2 (two) times daily.  180 tablet  3  . Multiple Vitamin (MULTIVITAMIN) capsule Take 1 capsule by mouth daily.       . niacin (NIASPAN) 750 MG CR tablet Take 750 mg by mouth at bedtime.      . Omega-3 Fatty Acids (OMEGA 3 PO) Take 360 mg by mouth 2 (two) times daily.      . tamsulosin (FLOMAX) 0.4 MG CAPS capsule Take 1 capsule (0.4 mg total) by mouth daily.  90 capsule  3   No current facility-administered medications on file prior to visit.   Review of Systems  Constitutional: Negative for  unusual diaphoresis or other sweats  HENT: Negative for ringing in ear Eyes: Negative for double vision or worsening visual disturbance.  Respiratory: Negative for choking and stridor.   Gastrointestinal: Negative for vomiting or other signifcant bowel change Genitourinary: Negative for hematuria or decreased urine volume.  Musculoskeletal: Negative for other MSK pain or swelling Skin: Negative for color change and worsening wound.  Neurological: Negative for tremors and numbness other than noted  Psychiatric/Behavioral: Negative for decreased concentration or agitation other than above       Objective:   Physical Exam BP 120/78  Pulse 58  Temp(Src) 98.4 F (36.9 C) (Oral)  Ht 5\' 6"  (1.676 m)  Wt 233 lb (105.688 kg)  BMI 37.63 kg/m2  SpO2 98% VS noted,  Constitutional: Pt appears well-developed, well-nourished.  HENT: Head: NCAT.  Right Ear: External ear normal.  Left Ear: External ear normal.  Eyes: . Pupils are equal, round, and reactive to light. Conjunctivae and EOM are normal Neck: Normal range of motion. Neck supple.  Cardiovascular: Normal rate and regular rhythm.   Pulmonary/Chest: Effort normal and breath sounds normal.  Abd:  Soft, NT, ND, + BS Neurological: Pt is alert. Not confused , motor grossly intact Skin: Skin is warm. No rash Psychiatric: Pt behavior is normal. No agitation.  BP Readings from Last 3 Encounters:  05/31/14 120/78  04/05/14 116/62  11/24/13 140/90          Assessment & Plan:

## 2014-05-31 NOTE — Progress Notes (Signed)
Pre visit review using our clinic review tool, if applicable. No additional management support is needed unless otherwise documented below in the visit note. 

## 2014-05-31 NOTE — Assessment & Plan Note (Signed)
stable overall by history and exam, recent data reviewed with pt, and pt to continue medical treatment as before,  to f/u any worsening symptoms or concerns Lab Results  Component Value Date   LDLCALC 54 11/24/2013

## 2014-06-01 ENCOUNTER — Ambulatory Visit: Payer: Commercial Managed Care - HMO | Admitting: Internal Medicine

## 2014-07-20 ENCOUNTER — Other Ambulatory Visit: Payer: Self-pay | Admitting: Internal Medicine

## 2014-08-29 ENCOUNTER — Encounter: Payer: Self-pay | Admitting: Podiatry

## 2014-08-29 ENCOUNTER — Ambulatory Visit (INDEPENDENT_AMBULATORY_CARE_PROVIDER_SITE_OTHER): Payer: Commercial Managed Care - HMO | Admitting: Podiatry

## 2014-08-29 VITALS — BP 127/60 | HR 62 | Resp 18

## 2014-08-29 DIAGNOSIS — M79673 Pain in unspecified foot: Secondary | ICD-10-CM

## 2014-08-29 DIAGNOSIS — B351 Tinea unguium: Secondary | ICD-10-CM

## 2014-08-30 ENCOUNTER — Encounter: Payer: Self-pay | Admitting: Podiatry

## 2014-08-30 NOTE — Progress Notes (Signed)
Patient ID: Andres [name], male   DOB: 07-Jan-1941, 74 y.o.   MRN: [id number]  Subjective:  74 year old male presents the office today with complaints of painful, thick, elongated toenails to all of his digits. He states the nails are painful as they are elongated around in the shoes. He denies any recent redness or drainage from the nail sites. No other complaints at this time in no acute changes since last appointment.  Objective: AAO 3, NAD DP/PT pulses are palpable, CRT less than 3 seconds Protective sensation intact with Simms Weinstein monofilament, Achilles tendon reflex intact. Nails are hypertrophic, dystrophic, elongated, brittle, discolored 10. No surrounding erythema or drainage from the nail sites. There is tenderness along all 10 toenails. No open lesions or pre-ulcer lesions identified. No other areas of tenderness to bilateral lower extremity. No edema, erythema, increase in warmth to bilateral lower extremities. No pain with calf compression, swelling, warmth, erythema.  Assessment: 74 year old male with symptomatic onychomycosis  Plan: -Treatment options discussed including alternatives, risks, complications. -Nail sharply debrided 10 without complication/bleeding. -Discussed daily foot inspection. -Follow-up in 3 months or sooner should any problems arise. In the meantime, encouraged to call the office with any questions, concerns, change in symptoms.

## 2014-09-04 ENCOUNTER — Other Ambulatory Visit: Payer: Commercial Managed Care - HMO

## 2014-09-27 ENCOUNTER — Ambulatory Visit (INDEPENDENT_AMBULATORY_CARE_PROVIDER_SITE_OTHER): Payer: Commercial Managed Care - HMO | Admitting: Cardiovascular Disease

## 2014-09-27 VITALS — BP 110/72 | HR 63 | Ht 66.0 in | Wt 237.3 lb

## 2014-09-27 DIAGNOSIS — I10 Essential (primary) hypertension: Secondary | ICD-10-CM

## 2014-09-27 DIAGNOSIS — I2510 Atherosclerotic heart disease of native coronary artery without angina pectoris: Secondary | ICD-10-CM

## 2014-09-27 DIAGNOSIS — E785 Hyperlipidemia, unspecified: Secondary | ICD-10-CM

## 2014-09-27 NOTE — Patient Instructions (Signed)
Your physician has recommended you make the following change in your medication: STOP the niacin.   Your physician recommends that you return for lab work and return office visit in 6 months. Lab slips will be mailed to you.

## 2014-09-29 ENCOUNTER — Encounter: Payer: Self-pay | Admitting: Cardiovascular Disease

## 2014-09-29 NOTE — Progress Notes (Signed)
Patient ID: Andres [name], male   DOB: 04/11/1941, 74 y.o.   MRN: [id number]     HPI: Andres [name] is a 74 y.o. male who presents to the office today for a 60month cardiology evaluation.   Andres [name] has established coronary as well a cerebral vascular disease. In 1997 he underwent CABG revascularization surgery x6 by Dr. Servando Snare and had a LIMA placed to the LAD, sequential vein to the intermediate and second diagonal, vein to the OM 2, sequential vein to the acute margin and PDA. 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 A nuclear perfusion study in March 2012 was unchanged and showed previously noted inferior scar. An echo Doppler study in May 2013 showed mild asymmetric left ventricular hypertrophy with an ejection fraction of 50-55%. There was mild posterior wall hypokinesis with mild inferior wall hypokinesis. He had moderate mitral regurgitation and mild aortic valve sclerosis with mild tricuspid insufficiency.    In December 2014 Andres [name] was hospitalized with renal insufficiency, hypotension, and had mildly positive troponin level. I did review these hospital records.  He had a negative pharmacologic stress test. He was found to have small bowel obstruction with right inguinal hernia with herniation of the distal small bowel loop. On December 18 he underwent surgery for this incarcerated right inguinal hernia with small bowel obstruction by Dr. Hulen Skains.  Since I last saw him in September 2015, he continues to exercise 4-5 days per week and walks in water. He denies chest pain or shortness of breath.  He is unaware of any palpitations.  He has not been successful with weight loss.  He had follow-up lipid studies in October.  He presents for evaluation.   Past Medical History  Diagnosis Date  . Hyperlipidemia   . Hypertension   . Hepatitis   . Obesity, morbid   . Benign prostatic hypertrophy   . Cataracts, bilateral   . Carpal tunnel syndrome, bilateral   .  Diverticulosis of colon   . Nephrolithiasis   . Low back pain   . ALLERGIC RHINITIS   . Head mass   . Diabetes mellitus type II   . Coronary artery disease     Dr. Claiborne Billings; 2D ECHO, 12/11/2011 - EF 50-55%, normal; NUCLEAR STRESS TEST, 10/16/2010 - no evidemce of inducible ischemia  . 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 Last nuc 2012 negative. 07/21/2013  . Inguinal hernia, Rt reduced, Lt present 07/21/2013  . Stroke   . GERD (gastroesophageal reflux disease)   . H/O hiatal hernia   . Mitral valve prolapse 11/24/2013    By echo dec 2014    Past Surgical History  Procedure Laterality Date  . Appendectomy    . Coronary artery bypass graft    . Cardiac catheterization    . Cataract extraction Right   . Inguinal hernia repair Right 07/26/2013    Procedure: REPAIR  INCARCERATED RIGHT INGUINAL  HERNIA ;  Surgeon: Gwenyth Ober, MD;  Location: [place];  Service: General;  Laterality: Right;    No Known Allergies  Current Outpatient Prescriptions  Medication Sig Dispense Refill  . acetaminophen (TYLENOL) 325 MG tablet Take 325 mg by mouth every 6 (six) hours as needed for mild pain.    . Ascorbic Acid (VITAMIN C) 1000 MG tablet Take  1,000 mg by mouth daily.    . Aspirin (ADULT ASPIRIN LOW STRENGTH) 81 MG EC tablet Take 81 mg by mouth daily.      Marland Kitchen atorvastatin (LIPITOR) 40 MG tablet TAKE 1 TABLET EVERY DAY 90 tablet 3  . clopidogrel (PLAVIX) 75 MG tablet TAKE 1 TABLET EVERY DAY 90 tablet 3  . diclofenac sodium (VOLTAREN) 1 % GEL Apply 2 g topically as needed (for arthritis).     . folic acid (FOLVITE) 1 MG tablet TAKE 1 TABLET EVERY DAY 90 tablet 3  . losartan-hydrochlorothiazide (HYZAAR) 50-12.5 MG per tablet TAKE 1 TABLET EVERY DAY 90 tablet 3  . metoprolol (LOPRESSOR) 50 MG tablet TAKE 1 TABLET TWICE DAILY 180  tablet 3  . Multiple Vitamin (MULTIVITAMIN) capsule Take 1 capsule by mouth daily.     . Omega-3 Fatty Acids (OMEGA 3 PO) Take 360 mg by mouth 2 (two) times daily.    . tamsulosin (FLOMAX) 0.4 MG CAPS capsule TAKE 1 CAPSULE EVERY DAY 90 capsule 3   No current facility-administered medications for this visit.    Social history is notable that he is widowed and has 2 children one grandchild. He does try to walk her there is no tobacco or alcohol use.  ROS General: Negative; No fevers, chills, or night sweats; positive for obesity HEENT: Negative; No changes in vision or hearing, sinus congestion, difficulty swallowing Pulmonary: Negative; No cough, wheezing, shortness of breath, hemoptysis Cardiovascular: Negative; No chest pain, presyncope, syncope, palpitations GI: Negative; No nausea, vomiting, diarrhea, or abdominal pain GU: Negative; No dysuria, hematuria, or difficulty voiding Musculoskeletal: Negative; no myalgias, joint pain, or weakness Hematologic/Oncology: Negative; no easy bruising, bleeding Endocrine: Positive for metabolic syndrome; no heat/cold intolerance;  Neuro: Negative; no changes in balance, headaches Skin: Negative; No rashes or skin lesions Psychiatric: Negative; No behavioral problems, depression Sleep: Negative; No snoring, daytime sleepiness, hypersomnolence, bruxism, restless legs, hypnogognic hallucinations, no cataplexy Other comprehensive 14 point system review is negative.  PE BP 110/72 mmHg  Pulse 63  Ht 5\' 6"  (1.676 m)  Wt 237 lb 4.8 oz (107.639 kg)  BMI 38.32 kg/m2  General: Alert, oriented, no distress.  HEENT: Normocephalic, atraumatic. Pupils round and reactive; sclera anicteric;no lid lag,  Nose without nasal septal hypertrophy Mouth/Parynx benign; Mallinpatti scale 3 Neck: No JVD, no carotid bruits with normal carotid up stroke. Lungs: clear to ausculatation and percussion; no wheezing or rales Chest wall: No tenderness to palpation Heart:  RRR, s1 s2 normal 1/6 systolic murmur; no S3 gallop. No rub. Abdomen: Moderate central adiposity; soft, nontender; no hepatosplenomehaly, BS+; abdominal aorta nontender and not dilated by palpation. Back: No CVA tenderness Pulses 2+ Extremities: no clubbing cyanosis or edema, Homan's sign negative  Neurologic: grossly nonfocal Psychological: Normal affect and mood.  ECG: (Independently read by me):  Normal sinus rhythm with sinus arrhythmia.  Nonspecific T changes.  September 2015ECG: (Independently read by me): Normal sinus rhythm with sinus arrhythmia.  QTc interval 422 ms.  Inferolateral T wave abnormality.  08/15/2013 ECG: (Independently read by me): Normal sinus rhythm with mild sinus arrhythmia. Nonspecific T changes inferolaterally. Early repolarization. Normal intervals. LABS:  BMET  BMP Latest Ref Rng 05/31/2014 11/24/2013 08/01/2013  Glucose 70 - 99 mg/dL 86 90 107(H)  BUN 6 - 23 mg/dL 14 13 19   Creatinine 0.4 - 1.5 mg/dL 1.2 1.2 1.31  Sodium 135 - 145 mEq/L 139 139 134(L)  Potassium 3.5 - 5.1 mEq/L 4.2 4.9 3.8  Chloride 96 - 112 mEq/L 103  102 101  CO2 19 - 32 mEq/L 33(H) 33(H) 25  Calcium 8.4 - 10.5 mg/dL 9.2 9.6 8.4     Hepatic Function Panel     Component Value Date/Time   PROT 6.8 05/31/2014 1516   ALBUMIN 3.6 05/31/2014 1516   AST 27 05/31/2014 1516   ALT 23 05/31/2014 1516   ALKPHOS 68 05/31/2014 1516   BILITOT 1.2 05/31/2014 1516   BILIDIR 0.2 05/31/2014 1516     CBC    Component Value Date/Time   WBC 3.8 Repeated and verified X2.* 11/24/2013 1006   RBC 4.00* 11/24/2013 1006   HGB 13.3 11/24/2013 1006   HCT 39.6 11/24/2013 1006   PLT 167.0 11/24/2013 1006   MCV 99.0 11/24/2013 1006   MCH 32.8 08/01/2013 0345   MCHC 33.6 11/24/2013 1006   RDW 14.5 11/24/2013 1006   LYMPHSABS 2.1 11/24/2013 1006   MONOABS 0.4 11/24/2013 1006   EOSABS 0.1 11/24/2013 1006   BASOSABS 0.0 11/24/2013 1006     BNP No results found for: PROBNP  Lipid Panel       Component Value Date/Time   CHOL 96 05/31/2014 1516   TRIG 45.0 05/31/2014 1516   HDL 44.10 05/31/2014 1516   CHOLHDL 2 05/31/2014 1516   VLDL 9.0 05/31/2014 1516   LDLCALC 43 05/31/2014 1516     RADIOLOGY: No results found.    ASSESSMENT AND PLAN: Mr. [place]ir[place] is 16 years status post CABG revascularization surgery. His stress test in March 2012 was unchanged from previously and revealed inferior scar without associated ischemia.  And his pharmacologic stress test done preoperatively for prior to undergoing surgery for bowel obstruction was negative for pharmacologic stress-induced ischemia.  His ejection fraction was 53%.  There was mild inferolateral attenuation.  Andres [name] blood pressure today is controlled on Lopressor 50 mg twice a day and Hyzaar 50/12.5 mg. He has been on combination therapy for his hyperlipidemia including atorvastatin 40 mg and Niaspan 750 mg.  I reviewed his most recent  Lipid studies.  His triglycerides and VLDL are excellent.  I have recommended he discontinue niacin.  LDL cholesterol was 43.  Any to encourage is 4-5 days of exercise per week.  Weight reduction was strongly encouraged.  I will see him in 6 months for reevaluation or sooner if problems arise.   Time spent: 25 minutes Troy Sine, MD, [facility]  09/29/2014 8:00 PM

## 2014-11-29 ENCOUNTER — Ambulatory Visit: Payer: Commercial Managed Care - HMO | Admitting: Podiatry

## 2014-11-30 ENCOUNTER — Ambulatory Visit (INDEPENDENT_AMBULATORY_CARE_PROVIDER_SITE_OTHER): Payer: Commercial Managed Care - HMO | Admitting: Internal Medicine

## 2014-11-30 ENCOUNTER — Other Ambulatory Visit (INDEPENDENT_AMBULATORY_CARE_PROVIDER_SITE_OTHER): Payer: Commercial Managed Care - HMO

## 2014-11-30 ENCOUNTER — Ambulatory Visit (INDEPENDENT_AMBULATORY_CARE_PROVIDER_SITE_OTHER): Payer: Commercial Managed Care - HMO

## 2014-11-30 ENCOUNTER — Encounter: Payer: Self-pay | Admitting: Internal Medicine

## 2014-11-30 ENCOUNTER — Encounter: Payer: Self-pay | Admitting: Nurse Practitioner

## 2014-11-30 VITALS — BP 118/72 | HR 52 | Temp 98.2°F | Resp 18 | Ht 66.0 in | Wt 224.0 lb

## 2014-11-30 DIAGNOSIS — Z0000 Encounter for general adult medical examination without abnormal findings: Secondary | ICD-10-CM | POA: Diagnosis not present

## 2014-11-30 DIAGNOSIS — M79673 Pain in unspecified foot: Secondary | ICD-10-CM | POA: Diagnosis not present

## 2014-11-30 DIAGNOSIS — B351 Tinea unguium: Secondary | ICD-10-CM | POA: Diagnosis not present

## 2014-11-30 DIAGNOSIS — E119 Type 2 diabetes mellitus without complications: Secondary | ICD-10-CM

## 2014-11-30 LAB — CBC WITH DIFFERENTIAL/PLATELET
Basophils Absolute: 0 10*3/uL (ref 0.0–0.1)
Basophils Relative: 0.3 % (ref 0.0–3.0)
EOS PCT: 1.5 % (ref 0.0–5.0)
Eosinophils Absolute: 0.1 10*3/uL (ref 0.0–0.7)
HCT: 40.7 % (ref 39.0–52.0)
Hemoglobin: 13.8 g/dL (ref 13.0–17.0)
LYMPHS ABS: 2.1 10*3/uL (ref 0.7–4.0)
Lymphocytes Relative: 42.2 % (ref 12.0–46.0)
MCHC: 33.8 g/dL (ref 30.0–36.0)
MCV: 97.1 fl (ref 78.0–100.0)
MONOS PCT: 7.5 % (ref 3.0–12.0)
Monocytes Absolute: 0.4 10*3/uL (ref 0.1–1.0)
NEUTROS PCT: 48.5 % (ref 43.0–77.0)
Neutro Abs: 2.4 10*3/uL (ref 1.4–7.7)
Platelets: 196 10*3/uL (ref 150.0–400.0)
RBC: 4.19 Mil/uL — ABNORMAL LOW (ref 4.22–5.81)
RDW: 14.2 % (ref 11.5–15.5)
WBC: 5 10*3/uL (ref 4.0–10.5)

## 2014-11-30 LAB — URINALYSIS, ROUTINE W REFLEX MICROSCOPIC
Bilirubin Urine: NEGATIVE
HGB URINE DIPSTICK: NEGATIVE
Ketones, ur: NEGATIVE
LEUKOCYTES UA: NEGATIVE
NITRITE: NEGATIVE
RBC / HPF: NONE SEEN (ref 0–?)
Specific Gravity, Urine: 1.02 (ref 1.000–1.030)
Total Protein, Urine: NEGATIVE
UROBILINOGEN UA: 0.2 (ref 0.0–1.0)
Urine Glucose: NEGATIVE
WBC, UA: NONE SEEN (ref 0–?)
pH: 6 (ref 5.0–8.0)

## 2014-11-30 LAB — BASIC METABOLIC PANEL
BUN: 18 mg/dL (ref 6–23)
CHLORIDE: 102 meq/L (ref 96–112)
CO2: 31 mEq/L (ref 19–32)
Calcium: 10 mg/dL (ref 8.4–10.5)
Creatinine, Ser: 1.4 mg/dL (ref 0.40–1.50)
GFR: 63.36 mL/min (ref >60.00)
GLUCOSE: 88 mg/dL (ref 70–99)
Potassium: 4.7 mEq/L (ref 3.5–5.1)
Sodium: 138 mEq/L (ref 135–145)

## 2014-11-30 LAB — LIPID PANEL
CHOL/HDL RATIO: 3
Cholesterol: 130 mg/dL (ref 0–200)
HDL: 39.5 mg/dL (ref >39.00)
LDL CALC: 75 mg/dL (ref 0–99)
NONHDL: 90.5
TRIGLYCERIDES: 78 mg/dL (ref 0.0–149.0)
VLDL: 15.6 mg/dL (ref 0.0–40.0)

## 2014-11-30 LAB — HEPATIC FUNCTION PANEL
ALK PHOS: 71 U/L (ref 39–117)
ALT: 19 U/L (ref 0–53)
AST: 28 U/L (ref 0–37)
Albumin: 4.6 g/dL (ref 3.5–5.2)
BILIRUBIN DIRECT: 0.2 mg/dL (ref 0.0–0.3)
TOTAL PROTEIN: 6.6 g/dL (ref 6.0–8.3)
Total Bilirubin: 0.9 mg/dL (ref 0.2–1.2)

## 2014-11-30 LAB — TSH: TSH: 0.82 u[IU]/mL (ref 0.35–4.50)

## 2014-11-30 LAB — MICROALBUMIN / CREATININE URINE RATIO
Creatinine,U: 212.5 mg/dL
Microalb Creat Ratio: 2.3 mg/g (ref 0.0–30.0)
Microalb, Ur: 4.8 mg/dL — ABNORMAL HIGH (ref 0.0–1.9)

## 2014-11-30 LAB — HEMOGLOBIN A1C: Hgb A1c MFr Bld: 6.3 % (ref 4.6–6.5)

## 2014-11-30 LAB — PSA: PSA: 0.45 ng/mL (ref 0.10–4.00)

## 2014-11-30 NOTE — Progress Notes (Signed)
Subjective:    Patient ID: Andres [name], male    DOB: 1940-12-06, 74 y.o.   MRN: [id number]  HPI  Here for wellness and f/u;  Overall doing ok;  Pt denies Chest pain, worsening SOB, DOE, wheezing, orthopnea, PND, worsening LE edema, palpitations, dizziness or syncope.  Pt denies neurological change such as new headache, facial or extremity weakness.  Pt denies polydipsia, polyuria, or low sugar symptoms. Pt states overall good compliance with treatment and medications, good tolerability, and has been trying to follow appropriate diet.  Pt denies worsening depressive symptoms, suicidal ideation or panic. No fever, night sweats, wt loss, loss of appetite, or other constitutional symptoms.  Pt states good ability with ADL's, has low fall risk, home safety reviewed and adequate, no other significant changes in hearing or vision, and only occasionally active with exercise. No compalints Past Medical History  Diagnosis Date  . Hyperlipidemia   . Hypertension   . Hepatitis   . Obesity, morbid   . Benign prostatic hypertrophy   . Cataracts, bilateral   . Carpal tunnel syndrome, bilateral   . Diverticulosis of colon   . Nephrolithiasis   . Low back pain   . ALLERGIC RHINITIS   . Head mass   . Diabetes mellitus type II   . Coronary artery disease     Dr. Claiborne Billings; 2D ECHO, 12/11/2011 - EF 50-55%, normal; NUCLEAR STRESS TEST, 10/16/2010 - no evidemce of inducible ischemia  . 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 Last nuc 2012 negative. 07/21/2013  . Inguinal hernia, Rt reduced, Lt present 07/21/2013  . Stroke   . GERD (gastroesophageal reflux disease)   . H/O hiatal hernia   . Mitral valve prolapse 11/24/2013    By echo dec 2014   Past Surgical History  Procedure Laterality Date  . Appendectomy    . Coronary artery bypass  graft    . Cardiac catheterization    . Cataract extraction Right   . Inguinal hernia repair Right 07/26/2013    Procedure: REPAIR  INCARCERATED RIGHT INGUINAL  HERNIA ;  Surgeon: Gwenyth Ober, MD;  Location: [facility];  Service: General;  Laterality: Right;    reports that he has quit smoking. He does not have any smokeless tobacco history on file. He reports that he does not drink alcohol or use illicit drugs. family history includes Alzheimer's disease in his father; Cancer in his brother; Heart attack in his brother, maternal grandmother, and mother; Hypertension in his maternal grandmother and son; Kidney disease in his brother and son. No Known Allergies Current Outpatient Prescriptions on File Prior to Visit  Medication Sig Dispense Refill  . acetaminophen (TYLENOL) 325 MG tablet Take 325 mg by mouth every 6 (six) hours as needed for mild pain.    . Ascorbic Acid (VITAMIN C) 1000 MG tablet Take 1,000 mg by mouth daily.    . Aspirin (ADULT ASPIRIN LOW STRENGTH) 81 MG EC tablet Take 81 mg by mouth daily.      Marland Kitchen atorvastatin (LIPITOR) 40 MG tablet TAKE 1 TABLET EVERY DAY 90 tablet 3  . clopidogrel (PLAVIX) 75 MG tablet TAKE 1 TABLET EVERY DAY 90 tablet 3  . diclofenac sodium (VOLTAREN) 1 % GEL Apply 2 g topically as needed (for arthritis).     . folic acid (FOLVITE)  1 MG tablet TAKE 1 TABLET EVERY DAY 90 tablet 3  . losartan-hydrochlorothiazide (HYZAAR) 50-12.5 MG per tablet TAKE 1 TABLET EVERY DAY 90 tablet 3  . metoprolol (LOPRESSOR) 50 MG tablet TAKE 1 TABLET TWICE DAILY 180 tablet 3  . Multiple Vitamin (MULTIVITAMIN) capsule Take 1 capsule by mouth daily.     . Omega-3 Fatty Acids (OMEGA 3 PO) Take 360 mg by mouth 2 (two) times daily.    . tamsulosin (FLOMAX) 0.4 MG CAPS capsule TAKE 1 CAPSULE EVERY DAY 90 capsule 3   No current facility-administered medications on file prior to visit.    Review of Systems Constitutional: Negative for increased diaphoresis, other activity, appetite  or siginficant weight change other than noted HENT: Negative for worsening hearing loss, ear pain, facial swelling, mouth sores and neck stiffness.   Eyes: Negative for other worsening pain, redness or visual disturbance.  Respiratory: Negative for shortness of breath and wheezing  Cardiovascular: Negative for chest pain and palpitations.  Gastrointestinal: Negative for diarrhea, blood in stool, abdominal distention or other pain Genitourinary: Negative for hematuria, flank pain or change in urine volume.  Musculoskeletal: Negative for myalgias or other joint complaints.  Skin: Negative for color change and wound or drainage.  Neurological: Negative for syncope and numbness. other than noted Hematological: Negative for adenopathy. or other swelling Psychiatric/Behavioral: Negative for hallucinations, SI, self-injury, decreased concentration or other worsening agitation.      Objective:   Physical Exam BP 118/72 mmHg  Pulse 52  Temp(Src) 98.2 F (36.8 C) (Oral)  Resp 18  Ht 5\' 6"  (1.676 m)  Wt 224 lb (101.606 kg)  BMI 36.17 kg/m2  SpO2 95% VS noted,  Constitutional: Pt is oriented to person, place, and time. Appears well-developed and well-nourished, in no significant distress Head: Normocephalic and atraumatic.  Right Ear: External ear normal.  Left Ear: External ear normal.  Nose: Nose normal.  Mouth/Throat: Oropharynx is clear and moist.  Eyes: Conjunctivae and EOM are normal. Pupils are equal, round, and reactive to light.  Neck: Normal range of motion. Neck supple. No JVD present. No tracheal deviation present or significant neck LA or mass Cardiovascular: Normal rate, regular rhythm, normal heart sounds and intact distal pulses.   Pulmonary/Chest: Effort normal and breath sounds without rales or wheezing  Abdominal: Soft. Bowel sounds are normal. NT. No HSM  Musculoskeletal: Normal range of motion. Exhibits no edema.  Lymphadenopathy:  Has no cervical adenopathy.    Neurological: Pt is alert and oriented to person, place, and time. Pt has normal reflexes. No cranial nerve deficit. Motor grossly intact Skin: Skin is warm and dry. No rash noted.  Psychiatric:  Has normal mood and affect. Behavior is normal.       Assessment & Plan:

## 2014-11-30 NOTE — Assessment & Plan Note (Signed)
stable overall by history and exam, recent data reviewed with pt, and pt to continue medical treatment as before,  to f/u any worsening symptoms or concerns Lab Results  Component Value Date   HGBA1C 6.3 05/31/2014

## 2014-11-30 NOTE — Patient Instructions (Addendum)
Please continue all other medications as before, and refills have been done if requested.  Please have the pharmacy call with any other refills you may need.  Please continue your efforts at being more active, low cholesterol diet, and weight control.  You are otherwise up to date with prevention measures today.  Please keep your appointments with your specialists as you may have planned  You will be contacted regarding the referral for: colonoscopy  Please go to the LAB in the Basement (turn left off the elevator) for the tests to be done today  You will be contacted by phone if any changes need to be made immediately.  Otherwise, you will receive a letter about your results with an explanation, but please check with MyChart first.  Please remember to sign up for MyChart if you have not done so, as this will be important to you in the future with finding out test results, communicating by private email, and scheduling acute appointments online when needed.  Please return in 6 months, or sooner if needed, with Lab testing done 3-5 days before  

## 2014-11-30 NOTE — Assessment & Plan Note (Signed)

## 2014-12-05 NOTE — Progress Notes (Signed)
Patient ID: Andres [name], male   DOB: 1940-08-26, 74 y.o.   MRN: [id number]  Subjective:  74 year old male presents the office today with complaints of painful, thick, elongated toenails to all of his digits. He states the nails are painful as they are elongated around in the shoes. He denies any recent redness or drainage from the nail sites. No other complaints at this time in no acute changes since last appointment.  Objective: AAO 3, NAD DP/PT pulses are palpable, CRT less than 3 seconds Protective sensation intact with Simms Weinstein monofilament, Achilles tendon reflex intact. Nails are hypertrophic, dystrophic, elongated, brittle, discolored 10. No surrounding erythema or drainage from the nail sites. There is tenderness along all 10 toenails. No open lesions or pre-ulcer lesions identified. No other areas of tenderness to bilateral lower extremity. No edema, erythema, increase in warmth to bilateral lower extremities. No pain with calf compression, swelling, warmth, erythema.  Assessment: 74 year old male with symptomatic onychomycosis  Plan: -Treatment options discussed including alternatives, risks, complications. -Nail sharply debrided 10 without complication/bleeding. -Discussed daily foot inspection. -Follow-up in 3 months or sooner should any problems arise. In the meantime, encouraged to call the office with any questions, concerns, change in symptoms.

## 2014-12-21 ENCOUNTER — Ambulatory Visit (INDEPENDENT_AMBULATORY_CARE_PROVIDER_SITE_OTHER): Payer: Commercial Managed Care - HMO | Admitting: Nurse Practitioner

## 2014-12-21 ENCOUNTER — Encounter: Payer: Self-pay | Admitting: Nurse Practitioner

## 2014-12-21 ENCOUNTER — Telehealth: Payer: Self-pay | Admitting: *Deleted

## 2014-12-21 VITALS — BP 108/60 | HR 64 | Ht 65.5 in | Wt 233.1 lb

## 2014-12-21 DIAGNOSIS — Z1211 Encounter for screening for malignant neoplasm of colon: Secondary | ICD-10-CM | POA: Diagnosis not present

## 2014-12-21 DIAGNOSIS — Z7901 Long term (current) use of anticoagulants: Secondary | ICD-10-CM

## 2014-12-21 MED ORDER — POLYETHYLENE GLYCOL 3350 17 GM/SCOOP PO POWD: ORAL | Status: DC

## 2014-12-21 NOTE — Patient Instructions (Addendum)
We will call you once we have gotten a response from Dr. Shelva Majestic regarding the Plavix medication instructions for the procedure. You have been scheduled for a colonoscopy. Please follow written instructions given to you at your visit today.  Please pick up your prep supplies at the pharmacy within the next 1-3 days. [place]. If you use inhalers (even only as needed), please bring them with you on the day of your procedure. Your physician has requested that you go to www.startemmi.com and enter the access code given to you at your visit today. This web site gives a general overview about your procedure. However, you should still follow specific instructions given to you by our office regarding your preparation for the procedure.

## 2014-12-21 NOTE — Telephone Encounter (Signed)
12/21/2014   RE: Andres [name] DOB: [birth year] MRN: [id number]   Dear Dr. Shelva Majestic,    We have scheduled the above patient for an endoscopic procedure. Our records show that he is on anticoagulation therapy.   Please advise as to how long the patient may come off his therapy of Plavix prior to the procedure, which is scheduled for  02-21-2015.  Please fax back/ or route the completed form to [place] at [phone].   Sincerely,   Tye Savoy NP-C

## 2014-12-21 NOTE — Progress Notes (Signed)
HPI :   Patient is a 74 year old male, former patient of Dr. Sharlett Iles. He had a screening colonoscopy May 2005 with findings of only left-sided diverticulosis. Patient is due for another colon cancer screening. He has no gastrointestinal complaints. Specifically, he has no bowel changes, blood in stool, or abdominal pain. Hemoglobin late April was normal at 13.8.    CAD. EF 60% Dec 2014.    Past Medical History  Diagnosis Date  . Hyperlipidemia   . Hypertension   . Hepatitis   . Obesity, morbid   . Benign prostatic hypertrophy   . Cataracts, bilateral   . Carpal tunnel syndrome, bilateral   . Diverticulosis of colon   . Nephrolithiasis   . Low back pain   . ALLERGIC RHINITIS   . Head mass   . Diabetes mellitus type II   . Coronary artery disease     Dr. Claiborne Billings; 2D ECHO, 12/11/2011 - EF 50-55%, normal; NUCLEAR STRESS TEST, 10/16/2010 - no evidemce of inducible ischemia  . 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 Last nuc 2012 negative. 07/21/2013  . Inguinal hernia, Rt reduced, Lt present 07/21/2013  . Stroke   . GERD (gastroesophageal reflux disease)   . H/O hiatal hernia   . Mitral valve prolapse 11/24/2013    By echo dec 2014    Family History  Problem Relation Age of Onset  . Heart attack Mother   . Alzheimer's disease Father   . Heart attack Brother   . Heart attack Maternal Grandmother   . Hypertension Maternal Grandmother   . Pancreatic cancer Brother   . Kidney disease Brother   . Hypertension Son   . Kidney disease Son     ?  . Diabetes Mother   . Diabetes Brother   . Diabetes Sister    History  Substance Use Topics  . Smoking status: Former Smoker    Types: Cigarettes    Quit date: 12/20/1989  . Smokeless tobacco: Never Used  . Alcohol Use: No   Current Outpatient Prescriptions   Medication Sig Dispense Refill  . acetaminophen (TYLENOL) 325 MG tablet Take 325 mg by mouth every 6 (six) hours as needed for mild pain.    . Ascorbic Acid (VITAMIN C) 1000 MG tablet Take 1,000 mg by mouth daily.    . Aspirin (ADULT ASPIRIN LOW STRENGTH) 81 MG EC tablet Take 81 mg by mouth daily.      Marland Kitchen atorvastatin (LIPITOR) 40 MG tablet TAKE 1 TABLET EVERY DAY 90 tablet 3  . clopidogrel (PLAVIX) 75 MG tablet TAKE 1 TABLET EVERY DAY 90 tablet 3  . diclofenac sodium (VOLTAREN) 1 % GEL Apply 2 g topically as needed (for arthritis).     Marland Kitchen FEXOFENADINE HCL PO Take 1 tablet by mouth daily.    . folic acid (FOLVITE) 1 MG tablet TAKE 1 TABLET EVERY DAY 90 tablet 3  . losartan-hydrochlorothiazide (HYZAAR) 50-12.5 MG per tablet TAKE 1 TABLET EVERY DAY 90 tablet 3  . metoprolol (LOPRESSOR) 50 MG tablet TAKE 1 TABLET TWICE DAILY 180 tablet 3  . Multiple Vitamin (MULTIVITAMIN) capsule Take 1 capsule by mouth daily.     . Omega-3 Fatty Acids (OMEGA 3 PO) Take 360 mg by mouth 2 (two) times daily.    . tamsulosin (FLOMAX) 0.4 MG CAPS capsule TAKE  1 CAPSULE EVERY DAY 90 capsule 3  . polyethylene glycol powder (GLYCOLAX/MIRALAX) powder Use as directed for colonoscopy prep. 255 g 0   No current facility-administered medications for this visit.   No Known Allergies   Review of Systems: All systems reviewed and negative except where noted in HPI.   Physical Exam: BP 108/60 mmHg  Pulse 64  Ht 5' 5.5" (1.664 m)  Wt 233 lb 2 oz (105.745 kg)  BMI 38.19 kg/m2 Constitutional: Pleasant,well-developed, male in no acute distress. HEENT: Normocephalic and atraumatic. Conjunctivae are normal. No scleral icterus. Cardiovascular: Normal rate, regular rhythm.  Pulmonary/chest: Effort normal and breath sounds normal. No wheezing, rales or rhonchi. Abdominal: Soft, nondistended, nontender. Bowel sounds active throughout. There are no masses palpable. No hepatomegaly. Extremities: no edema Neurological: Alert and  oriented to person place and time. Skin: Skin is warm and dry. No rashes noted. Psychiatric: Normal mood and affect. Behavior is normal.   ASSESSMENT AND PLAN:  31. 74 year old male for colon cancer screening. The risks, benefits, and alternatives to colonoscopy with possible biopsy and possible polypectomy were discussed with the patient and he consents to proceed.   2. Chronic Plavix. Not sure if he takes for cardiac or CVA history. Will hold Plavix 5 days before procedure - will instruct when and how to resume after procedure. Will communicate by phone or EMR with patient's prescribing provider that to confirm holding Plavix is reasonable in this case.

## 2014-12-22 ENCOUNTER — Encounter: Payer: Self-pay | Admitting: Nurse Practitioner

## 2014-12-22 DIAGNOSIS — Z1211 Encounter for screening for malignant neoplasm of colon: Secondary | ICD-10-CM | POA: Insufficient documentation

## 2014-12-22 DIAGNOSIS — Z7901 Long term (current) use of anticoagulants: Secondary | ICD-10-CM | POA: Insufficient documentation

## 2014-12-23 NOTE — Progress Notes (Signed)
Agree with Ms. Guenther's assessment and plan. Carl E. Gessner, MD, FACG   

## 2015-01-01 NOTE — Telephone Encounter (Signed)
Ok to hold plavix for 5 days prior to procedure. 

## 2015-01-26 NOTE — Telephone Encounter (Signed)
Pt has been notified to hold plavix 5 days prior to procedure

## 2015-02-21 ENCOUNTER — Encounter: Payer: Self-pay | Admitting: Internal Medicine

## 2015-02-21 ENCOUNTER — Ambulatory Visit (AMBULATORY_SURGERY_CENTER): Payer: Commercial Managed Care - HMO | Admitting: Internal Medicine

## 2015-02-21 VITALS — BP 108/57 | HR 57 | Temp 97.5°F | Resp 19 | Ht 65.5 in | Wt 233.0 lb

## 2015-02-21 DIAGNOSIS — D123 Benign neoplasm of transverse colon: Secondary | ICD-10-CM

## 2015-02-21 DIAGNOSIS — Z1211 Encounter for screening for malignant neoplasm of colon: Secondary | ICD-10-CM | POA: Diagnosis not present

## 2015-02-21 DIAGNOSIS — K5730 Diverticulosis of large intestine without perforation or abscess without bleeding: Secondary | ICD-10-CM | POA: Diagnosis not present

## 2015-02-21 MED ORDER — SODIUM CHLORIDE 0.9 % IV SOLN: 500.0000 mL | INTRAVENOUS | Status: DC

## 2015-02-21 NOTE — Patient Instructions (Addendum)
I found and removed one polyp - it does not look like cancer. I will let you know pathology results and when or if to have another routine colonoscopy by mail. At your age you may not need another routine colonoscopy.  You also have a condition called diverticulosis - common and not usually a problem. Please read the handout provided.  I appreciate the opportunity to care for you. Gatha Mayer, MD, [facility]  Resume current medications today!!  YOU HAD AN ENDOSCOPIC PROCEDURE TODAY AT [facility]:   Refer to the procedure report that was given to you for any specific questions about what was found during the examination.  If the procedure report does not answer your questions, please call your gastroenterologist to clarify.  If you requested that your care partner not be given the details of your procedure findings, then the procedure report has been included in a sealed envelope for you to review at your convenience later.  YOU SHOULD EXPECT: Some feelings of bloating in the abdomen. Passage of more gas than usual.  Walking can help get rid of the air that was put into your GI tract during the procedure and reduce the bloating. If you had a lower endoscopy (such as a colonoscopy or flexible sigmoidoscopy) you may notice spotting of blood in your stool or on the toilet paper. If you underwent a bowel prep for your procedure, you may not have a normal bowel movement for a few days.  Please Note:  You might notice some irritation and congestion in your nose or some drainage.  This is from the oxygen used during your procedure.  There is no need for concern and it should clear up in a day or so.  SYMPTOMS TO REPORT IMMEDIATELY:   Following lower endoscopy (colonoscopy or flexible sigmoidoscopy):  Excessive amounts of blood in the stool  Significant tenderness or worsening of abdominal pains  Swelling of the abdomen that is new, acute  Fever of 100F or higher   For  urgent or emergent issues, a gastroenterologist can be reached at any hour by calling [phone].   DIET: Your first meal following the procedure should be a small meal and then it is ok to progress to your normal diet. Heavy or fried foods are harder to digest and may make you feel nauseous or bloated.  Likewise, meals heavy in dairy and vegetables can increase bloating.  Drink plenty of fluids but you should avoid alcoholic beverages for 24 hours.  ACTIVITY:  You should plan to take it easy for the rest of today and you should NOT DRIVE or use heavy machinery until tomorrow (because of the sedation medicines used during the test).    FOLLOW UP: Our staff will call the number listed on your records the next business day following your procedure to check on you and address any questions or concerns that you may have regarding the information given to you following your procedure. If we do not reach you, we will leave a message.  However, if you are feeling well and you are not experiencing any problems, there is no need to return our call.  We will assume that you have returned to your regular daily activities without incident.  If any biopsies were taken you will be contacted by phone or by letter within the next 1-3 weeks.  Please call us at [phone] if you have not heard about the biopsies in 3 weeks.  SIGNATURES/CONFIDENTIALITY: You and/or your care partner have signed paperwork which will be entered into your electronic medical record.  These signatures attest to the fact that that the information above on your After Visit Summary has been reviewed and is understood.  Full responsibility of the confidentiality of this discharge information lies with you and/or your care-partner.

## 2015-02-21 NOTE — Progress Notes (Signed)
Called to room to assist during endoscopic procedure.  Patient ID and intended procedure confirmed with present staff. Received instructions for my participation in the procedure from the performing physician.  

## 2015-02-21 NOTE — Progress Notes (Signed)
Transferred to recovery room. A/O x3, pleased with MAC.  VSS.  Report to Shirlean Mylar, Therapist, sports.

## 2015-02-21 NOTE — Op Note (Signed)
[place]  [address]. [place], [address]   COLONOSCOPY PROCEDURE REPORT  PATIENT: Andres [name], Andres [name]  MR#: [id number] BIRTHDATE: February 09, 1941 , 62  yrs. old GENDER: male ENDOSCOPIST: Gatha Mayer, MD, [facility] PROCEDURE DATE:  02/21/2015 PROCEDURE:   Colonoscopy, screening and Colonoscopy with snare polypectomy First Screening Colonoscopy - Avg.  risk and is 50 yrs.  old or older - No.  Prior Negative Screening - Now for repeat screening. 10 or more years since last screening  History of Adenoma - Now for follow-up colonoscopy & has been > or = to 3 yrs.  N/A  Polyps removed today? Yes ASA CLASS:   Class II INDICATIONS:Screening for colonic neoplasia and Colorectal Neoplasm Risk Assessment for this procedure is average risk. MEDICATIONS: Propofol 175 mg IV and Monitored anesthesia care  DESCRIPTION OF PROCEDURE:   After the risks benefits and alternatives of the procedure were thoroughly explained, informed consent was obtained.  The digital rectal exam revealed no abnormalities of the rectum and revealed no prostatic nodules. The LB [id number]  endoscope was introduced through the anus and advanced to the cecum, which was identified by both the appendix and ileocecal valve. No adverse events experienced.   The quality of the prep was good.  (MiraLax was used)  The instrument was then slowly withdrawn as the colon was fully examined. Estimated blood loss is zero unless otherwise noted in this procedure report.  COLON FINDINGS: A flat polyp measuring 7 mm in size was found in the proximal transverse colon.  A polypectomy was performed with a cold snare.  The resection was complete, the polyp tissue was completely retrieved and sent to histology.   There was diverticulosis noted in the left colon.   The examination was otherwise normal. Retroflexed views revealed no abnormalities. The time to cecum = 3.5 Withdrawal time = 10.7   The scope was withdrawn and  the procedure completed. COMPLICATIONS: There were no immediate complications.  ENDOSCOPIC IMPRESSION: 1.   Flat polyp was found in the proximal transverse colon; polypectomy was performed with a cold snare 2.   Diverticulosis was noted in the left colon 3.   The examination was otherwise normal  RECOMMENDATIONS: 1.  Restart clopidogrel (Plavix) today 2.  Unlikely to need another routine colonoscopy. 3.  Await biopsy results  eSigned:  Gatha Mayer, MD, [facility] 02/21/2015 2:08 PM   cc: The Patient

## 2015-02-22 ENCOUNTER — Telehealth: Payer: Self-pay | Admitting: *Deleted

## 2015-02-22 NOTE — Telephone Encounter (Signed)
  Follow up Call-  Call back number 02/21/2015  Post procedure Call Back phone  # [phone]  Permission to leave phone message Yes     Patient questions:  Do you have a fever, pain , or abdominal swelling? No. Pain Score  0 *  Have you tolerated food without any problems? Yes.    Have you been able to return to your normal activities? Yes.    Do you have any questions about your discharge instructions: Diet   No. Medications  No. Follow up visit  No.  Do you have questions or concerns about your Care? No.  Actions: * If pain score is 4 or above: No action needed, pain <4.

## 2015-02-27 ENCOUNTER — Encounter: Payer: Self-pay | Admitting: Internal Medicine

## 2015-02-27 DIAGNOSIS — Z860100 Personal history of colon polyps, unspecified: Secondary | ICD-10-CM

## 2015-02-27 HISTORY — DX: Personal history of colonic polyps: Z86.010

## 2015-02-27 HISTORY — DX: Personal history of colon polyps, unspecified: Z86.0100

## 2015-02-27 NOTE — Progress Notes (Signed)
Quick Note:  7 mm ssp - no recall (age) ______

## 2015-03-06 ENCOUNTER — Encounter: Payer: Self-pay | Admitting: *Deleted

## 2015-03-06 ENCOUNTER — Other Ambulatory Visit: Payer: Self-pay | Admitting: *Deleted

## 2015-03-06 DIAGNOSIS — E785 Hyperlipidemia, unspecified: Secondary | ICD-10-CM

## 2015-03-06 DIAGNOSIS — I10 Essential (primary) hypertension: Secondary | ICD-10-CM

## 2015-03-06 DIAGNOSIS — I2510 Atherosclerotic heart disease of native coronary artery without angina pectoris: Secondary | ICD-10-CM

## 2015-03-08 ENCOUNTER — Encounter: Payer: Self-pay | Admitting: Podiatry

## 2015-03-08 ENCOUNTER — Ambulatory Visit (INDEPENDENT_AMBULATORY_CARE_PROVIDER_SITE_OTHER): Payer: Commercial Managed Care - HMO | Admitting: Podiatry

## 2015-03-08 DIAGNOSIS — M79673 Pain in unspecified foot: Secondary | ICD-10-CM

## 2015-03-08 DIAGNOSIS — B351 Tinea unguium: Secondary | ICD-10-CM | POA: Diagnosis not present

## 2015-03-08 NOTE — Progress Notes (Signed)
Patient ID: Andres [name], male   DOB: 1941/02/24, 74 y.o.   MRN: [id number] . Complaint:  Visit Type: Patient returns to my office for continued preventative foot care services. Complaint: Patient states" my nails have grown long and thick and become painful to walk and wear shoes" Patient has been diagnosed with DM with no foot complications. The patient presents for preventative foot care services. No changes to ROS  Podiatric Exam: Vascular: dorsalis pedis and posterior tibial pulses are palpable bilateral. Capillary return is immediate. Temperature gradient is WNL. Skin turgor WNL  Sensorium: Normal Semmes Weinstein monofilament test. Normal tactile sensation bilaterally. Nail Exam: Pt has thick disfigured discolored nails with subungual debris noted bilateral entire nail hallux through fifth toenails Ulcer Exam: There is no evidence of ulcer or pre-ulcerative changes or infection. Orthopedic Exam: Muscle tone and strength are WNL. No limitations in general ROM. No crepitus or effusions noted. Foot type and digits show no abnormalities. Bony prominences are unremarkable. Skin: No Porokeratosis. No infection or ulcers  Diagnosis:  Onychomycosis, , Pain in right toe, pain in left toes  Treatment & Plan Procedures and Treatment: Consent by patient was obtained for treatment procedures. The patient understood the discussion of treatment and procedures well. All questions were answered thoroughly reviewed. Debridement of mycotic and hypertrophic toenails, 1 through 5 bilateral and clearing of subungual debris. No ulceration, no infection noted.  Return Visit-Office Procedure: Patient instructed to return to the office for a follow up visit 3 months for continued evaluation and treatment.

## 2015-03-09 ENCOUNTER — Telehealth: Payer: Self-pay | Admitting: Cardiovascular Disease

## 2015-03-09 NOTE — Telephone Encounter (Signed)
Close encounter 

## 2015-03-13 LAB — COMPREHENSIVE METABOLIC PANEL
ALT: 20 U/L (ref 9–46)
AST: 27 U/L (ref 10–35)
Albumin: 4.2 g/dL (ref 3.6–5.1)
Alkaline Phosphatase: 60 U/L (ref 40–115)
BILIRUBIN TOTAL: 0.8 mg/dL (ref 0.2–1.2)
BUN: 22 mg/dL (ref 7–25)
CHLORIDE: 103 mmol/L (ref 98–110)
CO2: 29 mmol/L (ref 20–31)
Calcium: 9.7 mg/dL (ref 8.6–10.3)
Creat: 1.21 mg/dL — ABNORMAL HIGH (ref 0.70–1.18)
Glucose, Bld: 105 mg/dL — ABNORMAL HIGH (ref 65–99)
POTASSIUM: 4.6 mmol/L (ref 3.5–5.3)
SODIUM: 139 mmol/L (ref 135–146)
Total Protein: 6.3 g/dL (ref 6.1–8.1)

## 2015-03-13 LAB — CBC
HEMATOCRIT: 40 % (ref 39.0–52.0)
Hemoglobin: 13.5 g/dL (ref 13.0–17.0)
MCH: 32.8 pg (ref 26.0–34.0)
MCHC: 33.8 g/dL (ref 30.0–36.0)
MCV: 97.1 fL (ref 78.0–100.0)
MPV: 10.3 fL (ref 8.6–12.4)
Platelets: 176 10*3/uL (ref 150–400)
RBC: 4.12 MIL/uL — ABNORMAL LOW (ref 4.22–5.81)
RDW: 14.9 % (ref 11.5–15.5)
WBC: 4.7 10*3/uL (ref 4.0–10.5)

## 2015-03-13 LAB — LIPID PANEL
Cholesterol: 136 mg/dL (ref 125–200)
HDL: 40 mg/dL (ref >=40)
LDL CALC: 75 mg/dL (ref <130)
Total CHOL/HDL Ratio: 3.4 Ratio (ref <=5.0)
Triglycerides: 106 mg/dL (ref <150)
VLDL: 21 mg/dL (ref <30)

## 2015-03-13 LAB — TSH: TSH: 1.285 u[IU]/mL (ref 0.350–4.500)

## 2015-03-19 ENCOUNTER — Telehealth: Payer: Self-pay | Admitting: Cardiovascular Disease

## 2015-03-19 NOTE — Telephone Encounter (Signed)
Informed pt of results, he voiced understanding. Copy of labs mailed to pt.

## 2015-03-19 NOTE — Telephone Encounter (Signed)
Pt is returning a nurse's call in regards to some labs that he had done last week. Please call  Thanks

## 2015-03-26 ENCOUNTER — Encounter: Payer: Self-pay | Admitting: *Deleted

## 2015-03-28 ENCOUNTER — Telehealth: Payer: Self-pay | Admitting: Cardiovascular Disease

## 2015-03-28 NOTE — Telephone Encounter (Signed)
Closed encounter °

## 2015-04-12 ENCOUNTER — Ambulatory Visit (INDEPENDENT_AMBULATORY_CARE_PROVIDER_SITE_OTHER): Payer: Commercial Managed Care - HMO | Admitting: Cardiovascular Disease

## 2015-04-12 ENCOUNTER — Encounter: Payer: Self-pay | Admitting: Cardiovascular Disease

## 2015-04-12 VITALS — BP 98/74 | HR 51 | Ht 65.5 in | Wt 234.0 lb

## 2015-04-12 DIAGNOSIS — E785 Hyperlipidemia, unspecified: Secondary | ICD-10-CM

## 2015-04-12 DIAGNOSIS — I10 Essential (primary) hypertension: Secondary | ICD-10-CM

## 2015-04-12 DIAGNOSIS — I2510 Atherosclerotic heart disease of native coronary artery without angina pectoris: Secondary | ICD-10-CM

## 2015-04-12 DIAGNOSIS — I2583 Coronary atherosclerosis due to lipid rich plaque: Secondary | ICD-10-CM

## 2015-04-12 MED ORDER — METOPROLOL TARTRATE 50 MG PO TABS: ORAL_TABLET | ORAL | Status: DC

## 2015-04-12 NOTE — Patient Instructions (Signed)
Your physician has recommended you make the following change in your medication: the metoprolol has been changed to 1 tablet in the  Morning and 1/2 tablet in the evening.  Your physician wants you to follow-up in: 6 months or sooner if needed with Dr. Claiborne Billings. You will receive a reminder letter in the mail two months in advance. If you don't receive a letter, please call our office to schedule the follow-up appointment.

## 2015-04-12 NOTE — Progress Notes (Signed)
Patient ID: Andres [name], male   DOB: 01-01-1941, 74 y.o.   MRN: [id number]   HPI: Andres [name] is a 74 y.o. male who presents to the office today for a 7 month cardiology evaluation.   Andres [name] has established coronary as well a cerebral vascular disease. In 1997 he underwent CABG revascularization surgery x6 by Dr. Servando [name] and had a LIMA placed to the LAD, sequential vein to the intermediate and second diagonal, vein to the OM 2, sequential vein to the acute margin and PDA. 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 A nuclear perfusion study in March 2012 was unchanged and showed previously noted inferior scar. An echo Doppler study in May 2013 showed mild asymmetric left ventricular hypertrophy with an ejection fraction of 50-55%. There was mild posterior wall hypokinesis with mild inferior wall hypokinesis. He had moderate mitral regurgitation and mild aortic valve sclerosis with mild tricuspid insufficiency.    In December 2014 he was hospitalized with renal insufficiency, hypotension, and had mildly positive troponin level. I did review these hospital records.  He had a negative pharmacologic stress test. He was found to have small bowel obstruction with right inguinal hernia with herniation of the distal small bowel loop. On December 18 he underwent surgery for this incarcerated right inguinal hernia with small bowel obstruction by Dr. Hulen [name].  Since I last saw him , he has remained fairly active.  He exercises 4 days a week and plays golf 2 days per week.  He specifically denies chest pain or shortness of breath.  He denies presyncope or syncope.  He is unaware of palpitations.   Past Medical History  Diagnosis Date  . Hyperlipidemia   . Hypertension   . Hepatitis   . Obesity, morbid   . Benign prostatic hypertrophy   . Cataracts, bilateral   . Carpal tunnel syndrome, bilateral   . Diverticulosis of colon   . Nephrolithiasis   . Low back pain   . ALLERGIC  RHINITIS   . Head mass   . Diabetes mellitus type II   . Coronary artery disease     Andres [name]; 2D ECHO, 12/11/2011 - EF 50-55%, normal; NUCLEAR STRESS TEST, 10/16/2010 - no evidemce of inducible ischemia  . 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 Last nuc 2012 negative. 07/21/2013  . Inguinal hernia, Rt reduced, Lt present 07/21/2013  . Stroke   . GERD (gastroesophageal reflux disease)   . H/O hiatal hernia   . Mitral valve prolapse 11/24/2013    By echo dec 2014  . Hx of   sessile serrated colonic polyp 02/27/2015    Past Surgical History  Procedure Laterality Date  . Appendectomy    . Coronary artery bypass graft    . Cardiac catheterization    . Cataract extraction Right   . Inguinal hernia repair Right 07/26/2013    Procedure: REPAIR  INCARCERATED RIGHT INGUINAL  HERNIA ;  Surgeon: Andres Ober, MD;  Location: [place];  Service: General;  Laterality: Right;    No Known Allergies  Current Outpatient Prescriptions  Medication Sig Dispense Refill  . acetaminophen (TYLENOL) 325 MG tablet Take 325 mg by mouth every 6 (six) hours as needed for mild pain.    . Ascorbic Acid (VITAMIN C) 1000 MG tablet Take 1,000  mg by mouth daily.    . Aspirin (ADULT ASPIRIN LOW STRENGTH) 81 MG EC tablet Take 81 mg by mouth daily.      Marland Kitchen atorvastatin (LIPITOR) 40 MG tablet TAKE 1 TABLET EVERY DAY 90 tablet 3  . clopidogrel (PLAVIX) 75 MG tablet TAKE 1 TABLET EVERY DAY 90 tablet 3  . diclofenac sodium (VOLTAREN) 1 % GEL Apply 2 g topically as needed (for arthritis).     Marland Kitchen FEXOFENADINE HCL PO Take 1 tablet by mouth daily.    . folic acid (FOLVITE) 1 MG tablet TAKE 1 TABLET EVERY DAY 90 tablet 3  . losartan-hydrochlorothiazide (HYZAAR) 50-12.5 MG per tablet TAKE 1 TABLET EVERY DAY 90 tablet 3  . metoprolol (LOPRESSOR) 50 MG tablet  Take 1 tablet in the AM and 1/2 tablet in the PM 180 tablet 3  . Multiple Vitamin (MULTIVITAMIN) capsule Take 1 capsule by mouth daily.     . Omega-3 Fatty Acids (OMEGA 3 PO) Take 360 mg by mouth 2 (two) times daily.    . tamsulosin (FLOMAX) 0.4 MG CAPS capsule TAKE 1 CAPSULE EVERY DAY 90 capsule 3   No current facility-administered medications for this visit.    Social history is notable that he is widowed and has 2 children one grandchild. He does try to walk her there is no tobacco or alcohol use.  ROS General: Negative; No fevers, chills, or night sweats; positive for obesity HEENT: Negative; No changes in vision or hearing, sinus congestion, difficulty swallowing Pulmonary: Negative; No cough, wheezing, shortness of breath, hemoptysis Cardiovascular: Negative; No chest pain, presyncope, syncope, palpitations GI: Negative; No nausea, vomiting, diarrhea, or abdominal pain GU: Negative; No dysuria, hematuria, or difficulty voiding Musculoskeletal: Negative; no myalgias, joint pain, or weakness Hematologic/Oncology: Negative; no easy bruising, bleeding Endocrine: Positive for metabolic syndrome; no heat/cold intolerance;  Neuro: Negative; no changes in balance, headaches Skin: Negative; No rashes or skin lesions Psychiatric: Negative; No behavioral problems, depression Sleep: Negative; No snoring, daytime sleepiness, hypersomnolence, bruxism, restless legs, hypnogognic hallucinations, no cataplexy Other comprehensive 14 point system review is negative.  PE BP 98/74 mmHg  Pulse 51  Ht 5' 5.5" (1.664 m)  Wt 234 lb (106.142 kg)  BMI 38.33 kg/m2  Repeat blood pressure by me was 104/70 supine and 105/70 standing. Wt Readings from Last 3 Encounters:  04/12/15 234 lb (106.142 kg)  02/21/15 233 lb (105.688 kg)  12/21/14 233 lb 2 oz (105.745 kg)   General: Alert, oriented, no distress.  HEENT: Normocephalic, atraumatic. Pupils round and reactive; sclera anicteric;no lid lag,  Nose  without nasal septal hypertrophy Mouth/Parynx benign; Mallinpatti scale 3 Neck: No JVD, no carotid bruits with normal carotid up stroke. Lungs: clear to ausculatation and percussion; no wheezing or rales Chest wall: No tenderness to palpation Heart: RRR, s1 s2 normal 1/6 systolic murmur; no S3 gallop. No rub, thrills or heaves Abdomen: Moderate central adiposity; soft, nontender; no hepatosplenomehaly, BS+; abdominal aorta nontender and not dilated by palpation. Back: No CVA tenderness Pulses 2+ Extremities: no clubbing cyanosis or edema, Homan's sign negative  Neurologic: grossly nonfocal Psychological: Normal affect and mood.  ECG (independently read by me): Sinus bradycardia 51 bpm.  Nonspecific T changes.  February 2016 ECG: (Independently read by me):  Normal sinus rhythm with sinus arrhythmia.  Nonspecific T changes.  September 2015ECG: (Independently read by me): Normal sinus rhythm with sinus arrhythmia.  QTc interval 422 ms.  Inferolateral T wave abnormality.  08/15/2013 ECG: (Independently read by me): Normal sinus rhythm  with mild sinus arrhythmia. Nonspecific T changes inferolaterally. Early repolarization. Normal intervals.  LABS:   BMP Latest Ref Rng 03/12/2015 11/30/2014 05/31/2014  Glucose 65 - 99 mg/dL 105(H) 88 86  BUN 7 - 25 mg/dL 22 18 14   Creatinine 0.70 - 1.18 mg/dL 1.21(H) 1.40 1.2  Sodium 135 - 146 mmol/L 139 138 139  Potassium 3.5 - 5.3 mmol/L 4.6 4.7 4.2  Chloride 98 - 110 mmol/L 103 102 103  CO2 20 - 31 mmol/L 29 31 33(H)  Calcium 8.6 - 10.3 mg/dL 9.7 10.0 9.2   Hepatic Function Latest Ref Rng 03/12/2015 11/30/2014 05/31/2014  Total Protein 6.1 - 8.1 g/dL 6.3 6.6 6.8  Albumin 3.6 - 5.1 g/dL 4.2 4.6 3.6  AST 10 - 35 U/L 27 28 27   ALT 9 - 46 U/L 20 19 23   Alk Phosphatase 40 - 115 U/L 60 71 68  Total Bilirubin 0.2 - 1.2 mg/dL 0.8 0.9 1.2  Bilirubin, Direct 0.0 - 0.3 mg/dL - 0.2 0.2   CBC Latest Ref Rng 03/12/2015 11/30/2014 11/24/2013  WBC 4.0 - 10.5 K/uL 4.7  5.0 3.8 Repeated and verified X2.(L)  Hemoglobin 13.0 - 17.0 g/dL 13.5 13.8 13.3  Hematocrit 39.0 - 52.0 % 40.0 40.7 39.6  Platelets 150 - 400 K/uL 176 196.0 167.0   Lab Results  Component Value Date   MCV 97.1 03/12/2015   MCV 97.1 11/30/2014   MCV 99.0 11/24/2013   Lab Results  Component Value Date   TSH 1.285 03/12/2015   Lab Results  Component Value Date   HGBA1C 6.3 11/30/2014   Lipid Panel     Component Value Date/Time   CHOL 136 03/12/2015 0823   TRIG 106 03/12/2015 0823   HDL 40 03/12/2015 0823   CHOLHDL 3.4 03/12/2015 0823   VLDL 21 03/12/2015 0823   LDLCALC 75 03/12/2015 0823   RADIOLOGY: No results found.    ASSESSMENT AND PLAN: Andres [name] is a 74 year old African-American male who is 18 years status post CABG revascularization surgery. His stress test in March 2012 was unchanged from previously and revealed inferior scar without associated ischemia and his pharmacologic stress test in 2014 done preoperatively for prior to undergoing surgery for bowel obstruction was negative for pharmacologic stress-induced ischemia.  His ejection fraction was 53%.  There was mild inferolateral attenuation.  His blood pressure today is mildly on the low side but is not having any significant orthostatic hypotension.  I'm reducing his metoprolol from 51 g twice a day to 50 mg in the morning and 25 mg in the evening.  He will continue with his current dose of losartan HCT 50/12.5 and a blood pressure continues to be low long as he is not having edema.  This may be changed to just losartan 50 mg daily.  He continues to be on atorvastatin 40 mg and omega-3 fatty acids for his lipid abnormalities.  I reviewed laboratory from 03/12/2015.  His cholesterol is excellent at 136 with LDL cholesterol 75 on current therapy.  Thyroid function studies are normal.  We disc.  He continues to exercise regularly.  His body mass index continues to be compatible with moderate obesity at 38 and have  recommended further weight loss.  He continues to be on dual platelet therapy.  There is no bleeding.  As long as he remains stable, I will see him in 6 months for reevaluation or sooner if problems arise.  Time spent: 25 minutes Troy Sine, MD, [facility]  04/12/2015 6:58 PM

## 2015-04-19 IMAGING — CR DG CHEST 1V PORT
1 series · 1 of 1 positions shown · non-contrast
Comparison: None.

CLINICAL DATA: Short of breath.  Chest pain.

EXAM:
PORTABLE CHEST - 1 VIEW

[AP]
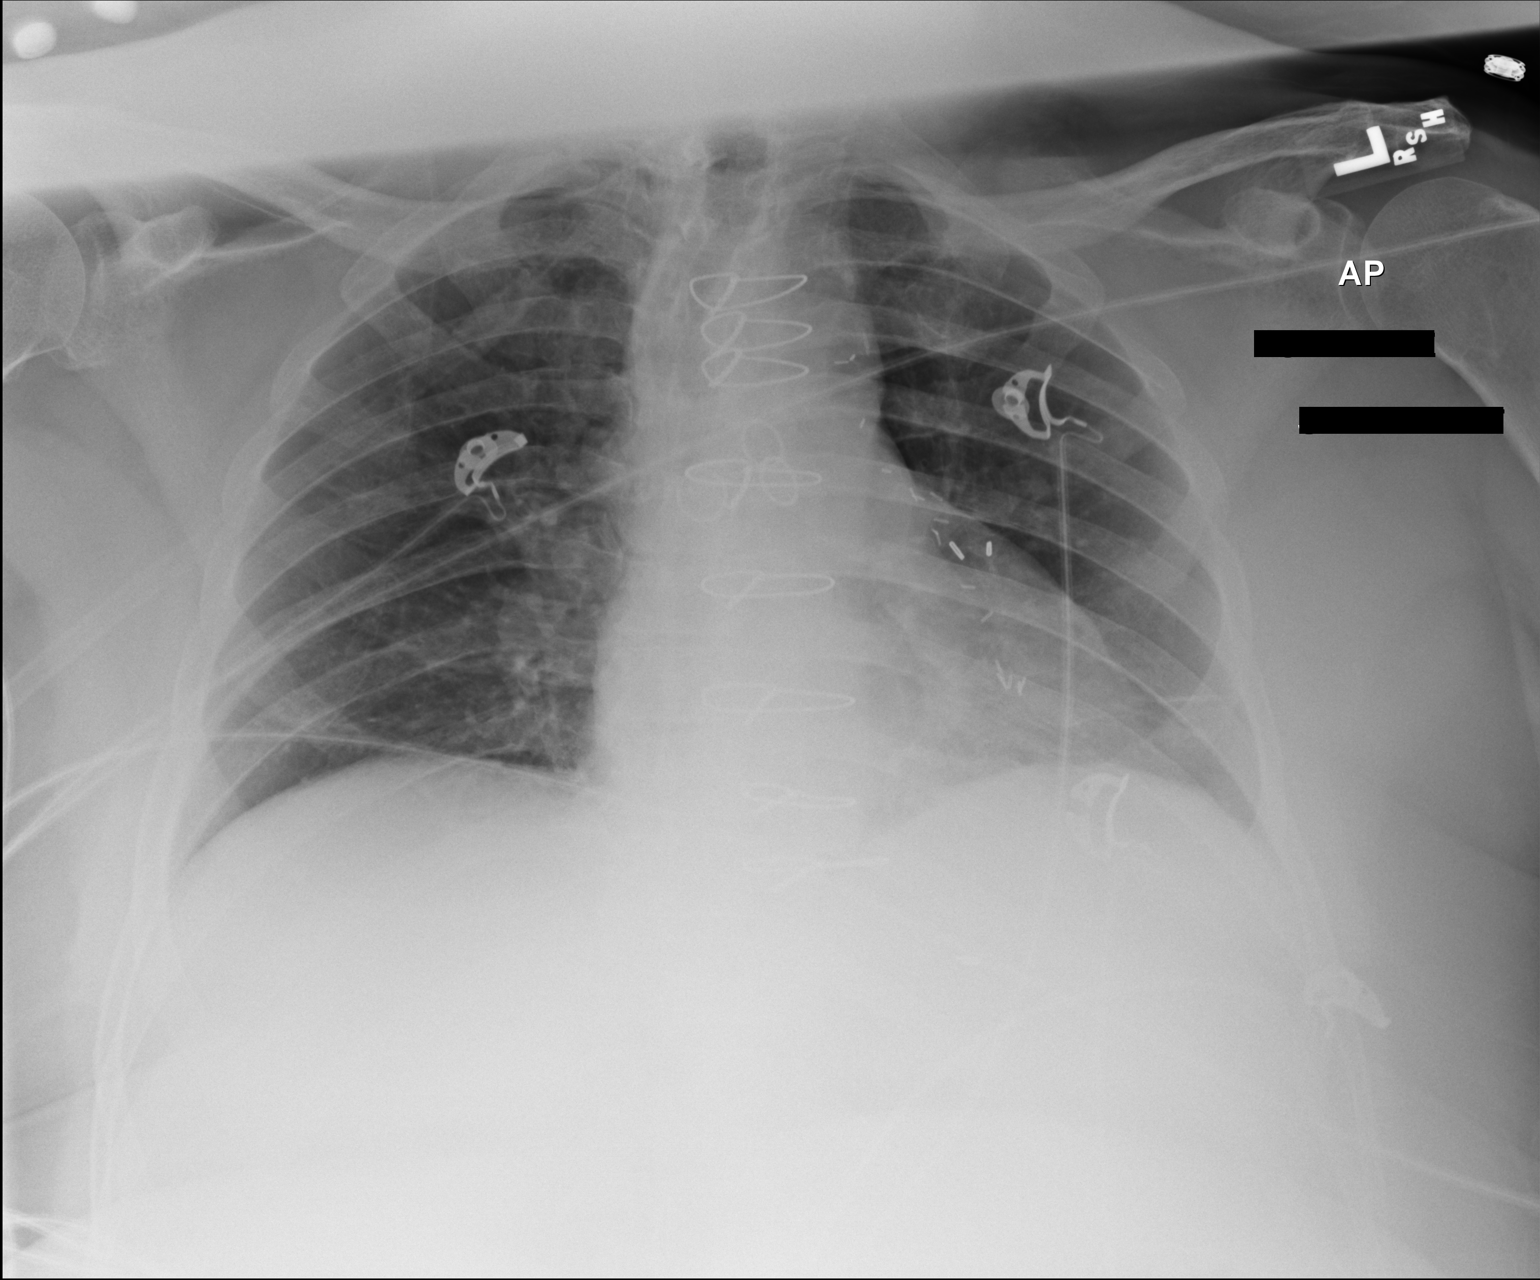

[1 of 1 positions shown; findings below may reference images not displayed]

FINDINGS: Low volume chest. CABG. Cardiopericardial silhouette appears within
normal limits. Artifact projects over the upper chest. Monitoring
leads project over the chest. No airspace disease. No effusion.
Subsegmental scarring in the lingula.

Retrocardiac shadow is present, likely representing fluid distended
esophagus performed on contemporaneously CT.
IMPRESSION: Low volume chest. No active cardiopulmonary disease. CABG.
Retrocardiac shadow likely represents fluid-filled esophagus
demonstrated on CT.

## 2015-04-19 IMAGING — CT CT CHEST W/O CM
2 of 4 series · 15 of 36 positions shown, 18 images · non-contrast
Comparison: None.

CLINICAL DATA: Chest pain.  Shortness of breath.

EXAM:
CT CHEST WITHOUT CONTRAST; CT ABDOMEN AND PELVIS WITHOUT CONTRAST
TECHNIQUE: Multidetector CT imaging of the chest was performed following the
standard protocol without IV contrast.; Multidetector CT imaging of
the abdomen and pelvis was performed following the standard protocol
without intravenous contrast.

[Series 2: cap without · axial · non-contrast · 0.68mm/px · z∈[-112,+473]mm · 12 of 131 slices shown, 15 images]
[im 7/131  mediastinal]
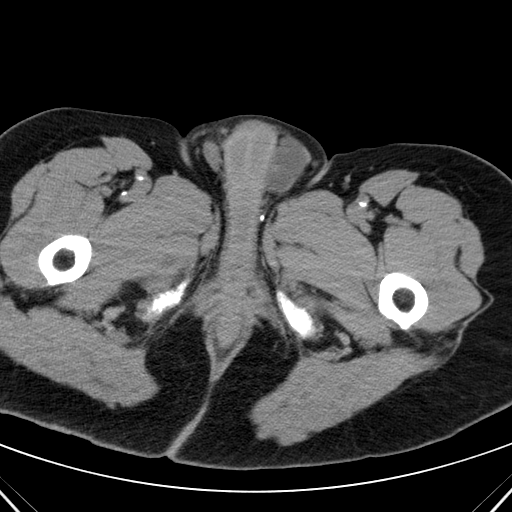
[im 7/131  lung]
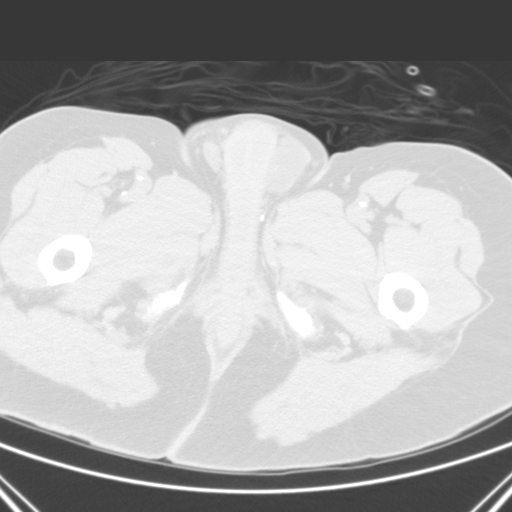
[im 20/131  lung]
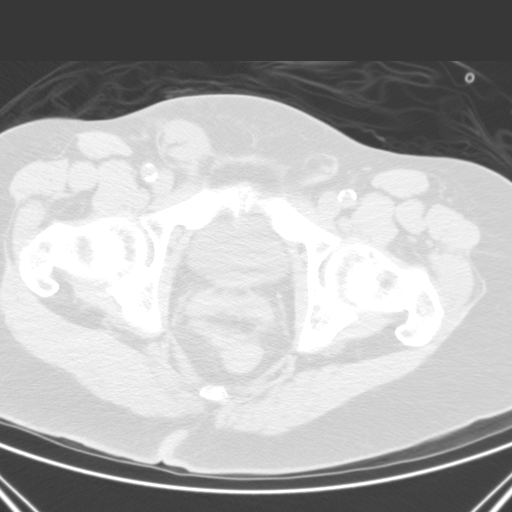
[im 27/131  lung]
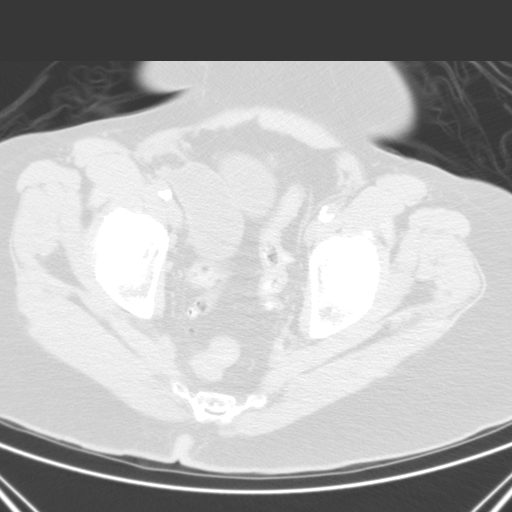
[im 40/131  lung]
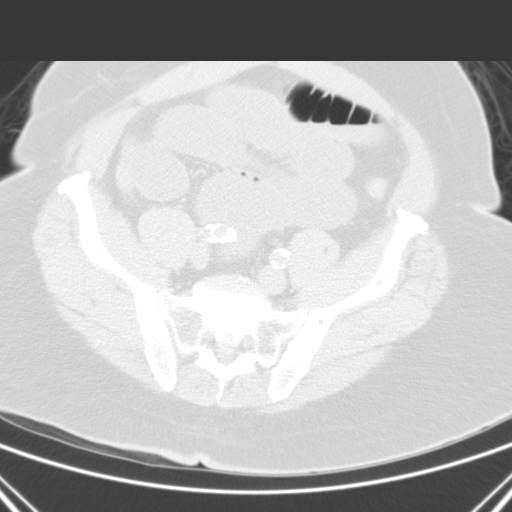
[im 53/131  mediastinal]
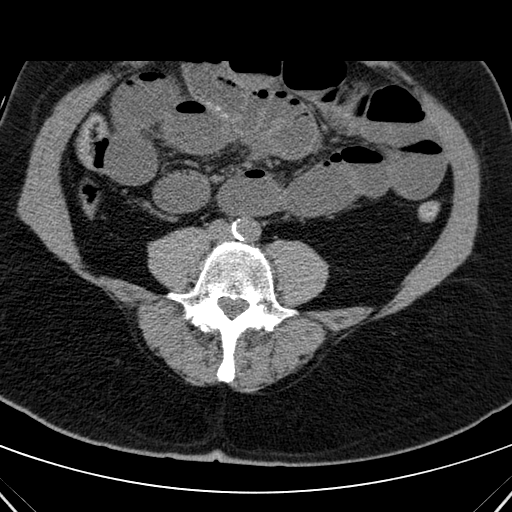
[im 53/131  lung]
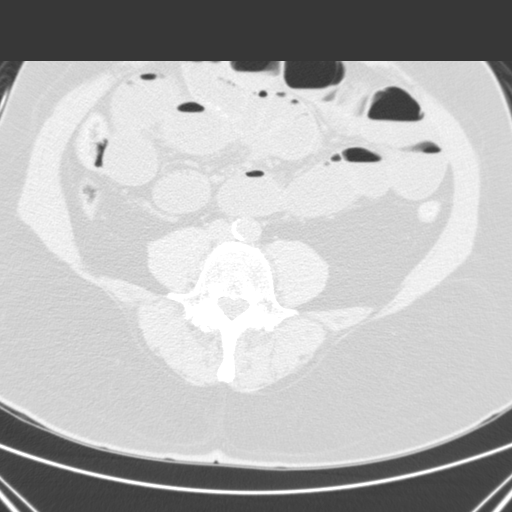
[im 59/131  lung]
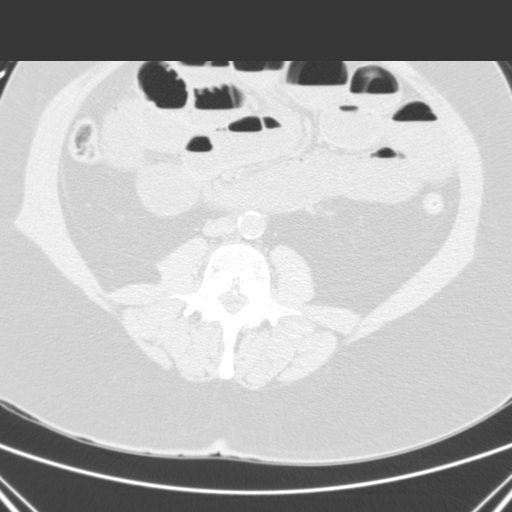
[im 72/131  lung]
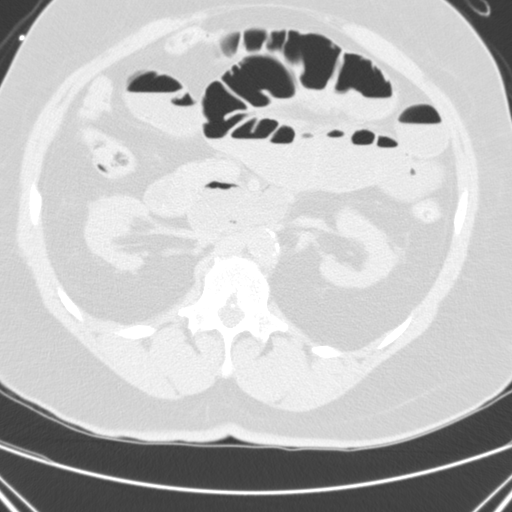
[im 79/131  lung]
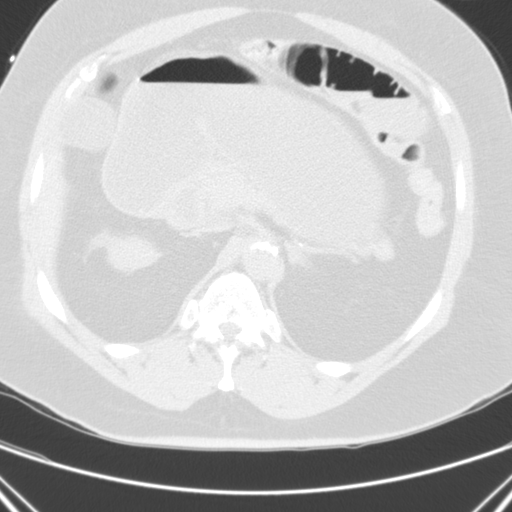
[im 92/131  mediastinal]
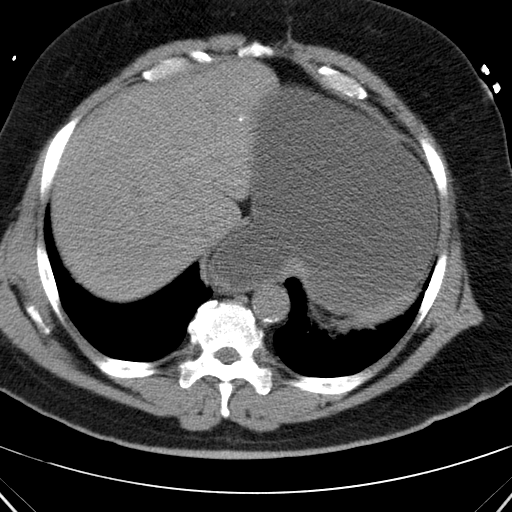
[im 92/131  lung]
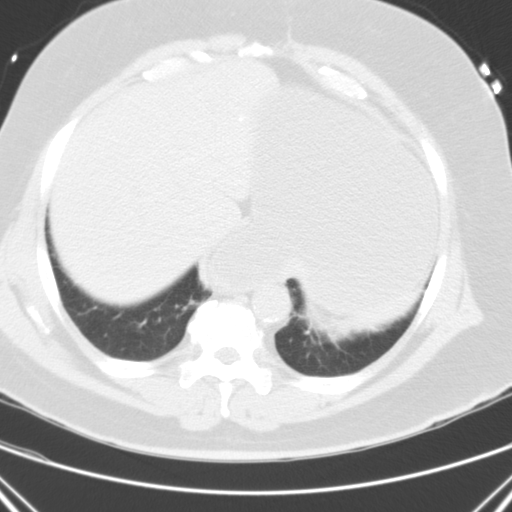
[im 105/131  lung]
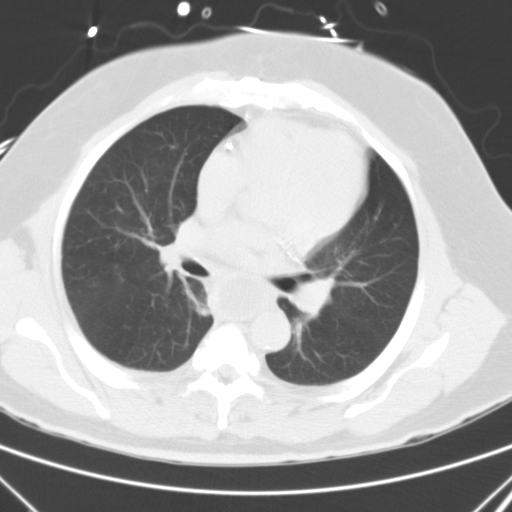
[im 111/131  lung]
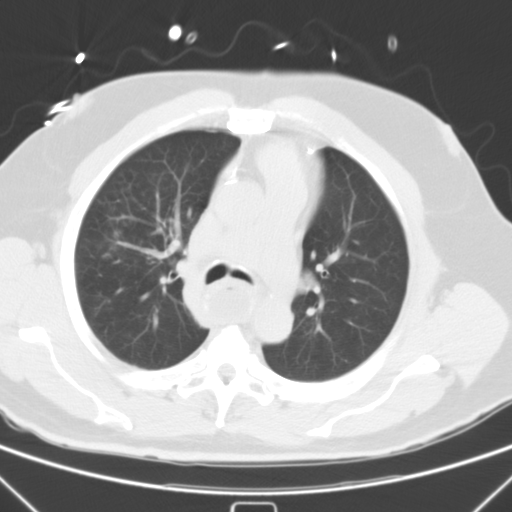
[im 124/131  lung]
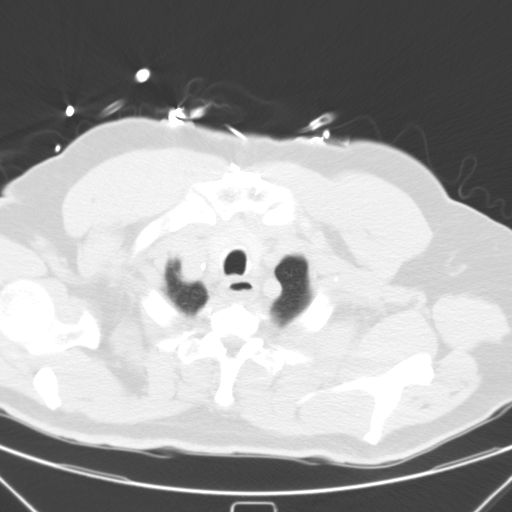

[Series 8024: coronals · coronal · 1.27mm/px · 3 of 145 slices shown]
[im 29/145  lung]
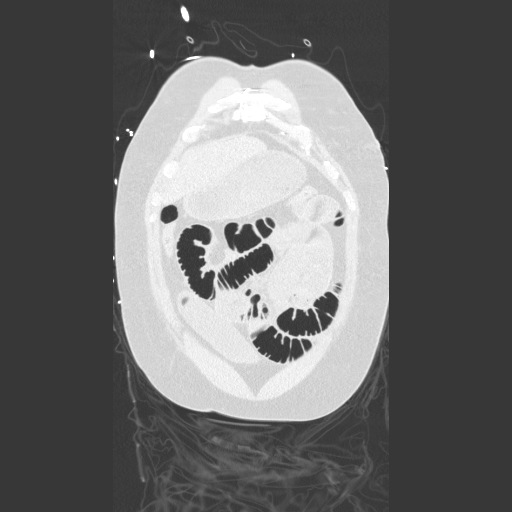
[im 58/145  lung]
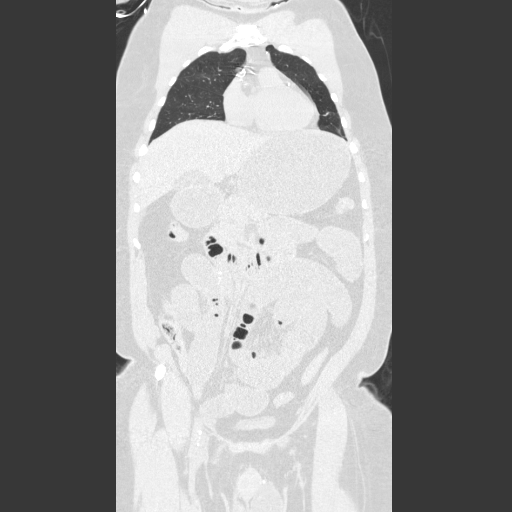
[im 87/145  lung]
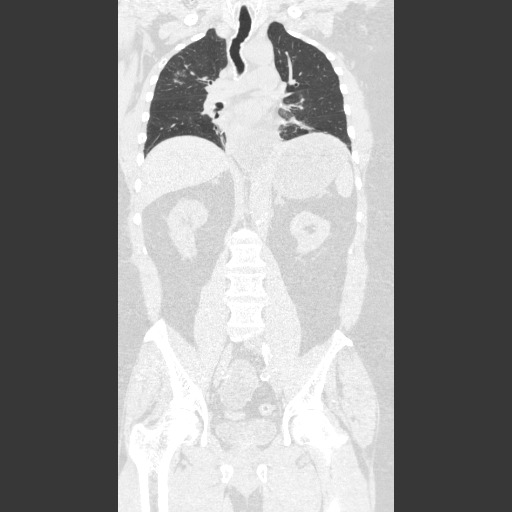

[15 of 36 positions shown; findings below may reference images not displayed]

FINDINGS: CT chest:

No evidence of mediastinal or hilar adenopathy. The thoracic
esophagus is severely dilated. A hiatal hernia is present. The
hiatal hernia is fluid filled. There is mild thickening of the
gastroesophageal junction. Prior CABG. Heart size normal. Large
airways are patent. Focal bilateral small ill-defined pulmonary
infiltrates are present throughout both lung fields. Although
metastatic disease cannot be excluded, these are most likely
infectious. Typical and atypical infection including granulomatous
disease should be considered. Multifocal small areas of aspiration
could present in this fashion .

CT abdomen pelvis:

Liver is normal. Spleen is normal. Pancreas is normal. Gallbladder
is normal. No biliary distention.

Adrenals normal. Kidneys normal. No hydronephrosis. No evidence of
obstructing ureteral stone. Bladder is nondistended. Mild prostate
enlargement. Phleboliths in pelvis.

Right inguinal hernia is present with herniation of what appears to
be a distal loop of small bowel. Proximal bowel severe distention is
present. Distal bowel is collapsed. Associated gastric distension is
present. As noted above esophagus is distended. These findings are
consistent with severe distal small bowel obstruction secondary to
herniation into the inguinal canal on the right. Bilateral
hydroceles are noted. There is a left inguinal hernia left with
herniation of fat only. Appendix not definitely identified. Sigmoid
colonic diverticulosis. No evidence of diverticulitis. No
pneumatosis or free air identified.

No adenopathy. Atherosclerotic vascular changes noted of the
abdominal aorta.

No acute bony abnormality identified. Thoracolumbar degenerative
changes i present.
IMPRESSION: 1. Right inguinal hernia with herniation of the distal small bowel
loop with severe obstruction of the small bowel. This results in
severe distention of the small bowel and severe distention of the
stomach. The esophagus is also severely distended. There is also a
left inguinal hernia with herniation of fat only. Bilateral
hydroceles are also noted.
2. Patchy bilateral pulmonary infiltrates consistent with
pneumonitis possibly from aspiration.
3. Prior CABG.
These results were called by telephone at the time of interpretation
on 07/21/2013 at [DATE] to Dr. HUGO YONNY ARZUBE Z , who verbally
acknowledged these results.

## 2015-05-03 ENCOUNTER — Other Ambulatory Visit: Payer: Self-pay | Admitting: Internal Medicine

## 2015-05-10 ENCOUNTER — Other Ambulatory Visit (INDEPENDENT_AMBULATORY_CARE_PROVIDER_SITE_OTHER): Payer: Commercial Managed Care - HMO

## 2015-05-10 ENCOUNTER — Ambulatory Visit (INDEPENDENT_AMBULATORY_CARE_PROVIDER_SITE_OTHER): Payer: Commercial Managed Care - HMO

## 2015-05-10 DIAGNOSIS — E119 Type 2 diabetes mellitus without complications: Secondary | ICD-10-CM

## 2015-05-10 DIAGNOSIS — Z23 Encounter for immunization: Secondary | ICD-10-CM

## 2015-05-10 LAB — BASIC METABOLIC PANEL
BUN: 14 mg/dL (ref 6–23)
CHLORIDE: 103 meq/L (ref 96–112)
CO2: 32 meq/L (ref 19–32)
Calcium: 9.4 mg/dL (ref 8.4–10.5)
Creatinine, Ser: 1.35 mg/dL (ref 0.40–1.50)
GFR: 66 mL/min (ref >60.00)
Glucose, Bld: 102 mg/dL — ABNORMAL HIGH (ref 70–99)
POTASSIUM: 4.7 meq/L (ref 3.5–5.1)
Sodium: 141 mEq/L (ref 135–145)

## 2015-05-10 LAB — HEPATIC FUNCTION PANEL
ALK PHOS: 62 U/L (ref 39–117)
ALT: 19 U/L (ref 0–53)
AST: 27 U/L (ref 0–37)
Albumin: 4.1 g/dL (ref 3.5–5.2)
BILIRUBIN DIRECT: 0.1 mg/dL (ref 0.0–0.3)
BILIRUBIN TOTAL: 0.6 mg/dL (ref 0.2–1.2)
Total Protein: 6.6 g/dL (ref 6.0–8.3)

## 2015-05-10 LAB — LIPID PANEL
CHOL/HDL RATIO: 3
Cholesterol: 111 mg/dL (ref 0–200)
HDL: 38.4 mg/dL — ABNORMAL LOW (ref >39.00)
LDL Cholesterol: 57 mg/dL (ref 0–99)
NONHDL: 72.47
Triglycerides: 76 mg/dL (ref 0.0–149.0)
VLDL: 15.2 mg/dL (ref 0.0–40.0)

## 2015-05-10 LAB — HEMOGLOBIN A1C: HEMOGLOBIN A1C: 6.2 % (ref 4.6–6.5)

## 2015-05-12 ENCOUNTER — Encounter: Payer: Self-pay | Admitting: Internal Medicine

## 2015-05-31 ENCOUNTER — Ambulatory Visit (INDEPENDENT_AMBULATORY_CARE_PROVIDER_SITE_OTHER): Payer: Commercial Managed Care - HMO | Admitting: Internal Medicine

## 2015-05-31 ENCOUNTER — Encounter: Payer: Self-pay | Admitting: Internal Medicine

## 2015-05-31 VITALS — BP 128/70 | HR 55 | Temp 98.1°F | Ht 66.0 in | Wt 235.0 lb

## 2015-05-31 DIAGNOSIS — I10 Essential (primary) hypertension: Secondary | ICD-10-CM | POA: Diagnosis not present

## 2015-05-31 DIAGNOSIS — E119 Type 2 diabetes mellitus without complications: Secondary | ICD-10-CM

## 2015-05-31 DIAGNOSIS — E785 Hyperlipidemia, unspecified: Secondary | ICD-10-CM | POA: Diagnosis not present

## 2015-05-31 DIAGNOSIS — Z0189 Encounter for other specified special examinations: Secondary | ICD-10-CM

## 2015-05-31 DIAGNOSIS — Z0000 Encounter for general adult medical examination without abnormal findings: Secondary | ICD-10-CM

## 2015-05-31 NOTE — Progress Notes (Signed)
Pre visit review using our clinic review tool, if applicable. No additional management support is needed unless otherwise documented below in the visit note. 

## 2015-05-31 NOTE — Progress Notes (Signed)
Subjective:    Patient ID: Andres [name], male    DOB: [birth year], 74 y.o.   MRN: [id number]  HPI  Here to f/u; overall doing ok,  Pt denies chest pain, increasing sob or doe, wheezing, orthopnea, PND, increased LE swelling, palpitations, dizziness or syncope.  Pt denies new neurological symptoms such as new headache, or facial or extremity weakness or numbness.  Pt denies polydipsia, polyuria, or low sugar episode.   Pt denies new neurological symptoms such as new headache, or facial or extremity weakness or numbness.   Pt states overall good compliance with meds, mostly trying to follow appropriate diet, with wt overall stable,  but little exercise however.  No current complaints Past Medical History  Diagnosis Date  . Hyperlipidemia   . Hypertension   . Hepatitis   . Obesity, morbid ([place])   . Benign prostatic hypertrophy   . Cataracts, bilateral   . Carpal tunnel syndrome, bilateral   . Diverticulosis of colon   . Nephrolithiasis   . Low back pain   . ALLERGIC RHINITIS   . Head mass   . Diabetes mellitus type II   . Coronary artery disease     Dr. Claiborne Billings; 2D ECHO, 12/11/2011 - EF 50-55%, normal; NUCLEAR STRESS TEST, 10/16/2010 - no evidemce of inducible ischemia  . 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 Last nuc 2012 negative. 07/21/2013  . Inguinal hernia, Rt reduced, Lt present 07/21/2013  . Stroke ([place])   . GERD (gastroesophageal reflux disease)   . H/O hiatal hernia   . Mitral valve prolapse 11/24/2013    By echo dec 2014  . Hx of   sessile serrated colonic polyp 02/27/2015   Past Surgical History  Procedure Laterality Date  . Appendectomy    . Coronary artery bypass graft    . Cardiac catheterization    . Cataract extraction Right   . Inguinal hernia repair Right 07/26/2013    Procedure: REPAIR   INCARCERATED RIGHT INGUINAL  HERNIA ;  Surgeon: Gwenyth Ober, MD;  Location: [place];  Service: General;  Laterality: Right;    reports that he quit smoking about 25 years ago. His smoking use included Cigarettes. He has never used smokeless tobacco. He reports that he does not drink alcohol or use illicit drugs. family history includes Alzheimer's disease in his father; Diabetes in his brother, mother, and sister; Heart attack in his brother, maternal grandmother, and mother; Hypertension in his maternal grandmother and son; Kidney disease in his brother and son; Pancreatic cancer in his brother. No Known Allergies Current Outpatient Prescriptions on File Prior to Visit  Medication Sig Dispense Refill  . acetaminophen (TYLENOL) 325 MG tablet Take 325 mg by mouth every 6 (six) hours as needed for mild pain.    . Ascorbic Acid (VITAMIN C) 1000 MG tablet Take 1,000 mg by mouth daily.    . Aspirin (ADULT ASPIRIN LOW STRENGTH) 81 MG EC tablet Take 81 mg by mouth daily.      Marland Kitchen atorvastatin (LIPITOR) 40 MG tablet TAKE 1 TABLET EVERY DAY 90 tablet 3  . clopidogrel (PLAVIX) 75 MG tablet TAKE 1 TABLET EVERY DAY 90 tablet 3  . diclofenac sodium (VOLTAREN) 1 % GEL Apply 2 g topically as needed (for arthritis).     Marland Kitchen FEXOFENADINE HCL PO Take  1 tablet by mouth daily.    . folic acid (FOLVITE) 1 MG tablet TAKE 1 TABLET EVERY DAY 90 tablet 3  . losartan-hydrochlorothiazide (HYZAAR) 50-12.5 MG tablet TAKE 1 TABLET EVERY DAY 90 tablet 3  . metoprolol (LOPRESSOR) 50 MG tablet TAKE 1 TABLET TWICE DAILY 180 tablet 3  . Multiple Vitamin (MULTIVITAMIN) capsule Take 1 capsule by mouth daily.     . Omega-3 Fatty Acids (OMEGA 3 PO) Take 360 mg by mouth 2 (two) times daily.    . tamsulosin (FLOMAX) 0.4 MG CAPS capsule TAKE 1 CAPSULE EVERY DAY 90 capsule 3   No current facility-administered medications on file prior to visit.   Review of Systems  Constitutional: Negative for unusual diaphoresis or night sweats HENT:  Negative for ringing in ear or discharge Eyes: Negative for double vision or worsening visual disturbance.  Respiratory: Negative for choking and stridor.   Gastrointestinal: Negative for vomiting or other signifcant bowel change Genitourinary: Negative for hematuria or change in urine volume.  Musculoskeletal: Negative for other MSK pain or swelling Skin: Negative for color change and worsening wound.  Neurological: Negative for tremors and numbness other than noted  Psychiatric/Behavioral: Negative for decreased concentration or agitation other than above       Objective:   Physical Exam BP 128/70 mmHg  Pulse 55  Temp(Src) 98.1 F (36.7 C) (Oral)  Ht 5\' 6"  (1.676 m)  Wt 235 lb (106.595 kg)  BMI 37.95 kg/m2  SpO2 97% VS noted,  Constitutional: Pt appears in no significant distress HENT: Head: NCAT.  Right Ear: External ear normal.  Left Ear: External ear normal.  Eyes: . Pupils are equal, round, and reactive to light. Conjunctivae and EOM are normal Neck: Normal range of motion. Neck supple.  Cardiovascular: Normal rate and regular rhythm.   Pulmonary/Chest: Effort normal and breath sounds without rales or wheezing.  Abd:  Soft, NT, ND, + BS Neurological: Pt is alert. Not confused , motor grossly intact Skin: Skin is warm. No rash, no LE edema Psychiatric: Pt behavior is normal. No agitation.     Assessment & Plan:

## 2015-05-31 NOTE — Assessment & Plan Note (Signed)
stable overall by history and exam, recent data reviewed with pt, and pt to continue medical treatment as before,  to f/u any worsening symptoms or concerns BP Readings from Last 3 Encounters:  05/31/15 128/70  04/12/15 98/74  02/21/15 108/57  .lsatld

## 2015-05-31 NOTE — Assessment & Plan Note (Signed)
stable overall by history and exam, recent data reviewed with pt, and pt to continue medical treatment as before,  to f/u any worsening symptoms or concerns BP Readings from Last 3 Encounters:  05/31/15 128/70  04/12/15 98/74  02/21/15 108/57

## 2015-05-31 NOTE — Patient Instructions (Signed)
Please continue all other medications as before, and refills have been done if requested.  Please have the pharmacy call with any other refills you may need.  Please continue your efforts at being more active, low cholesterol diet, and weight control.  You are otherwise up to date with prevention measures today.  Please keep your appointments with your specialists as you may have planned  Please return in 6 months, or sooner if needed, with Lab testing done 3-5 days before  

## 2015-05-31 NOTE — Assessment & Plan Note (Signed)
stable overall by history and exam, recent data reviewed with pt, and pt to continue medical treatment as before,  to f/u any worsening symptoms or concerns Lab Results  Component Value Date   HGBA1C 6.2 05/10/2015

## 2015-06-04 LAB — HM DIABETES EYE EXAM

## 2015-06-05 ENCOUNTER — Encounter: Payer: Self-pay | Admitting: Internal Medicine

## 2015-06-07 ENCOUNTER — Ambulatory Visit (INDEPENDENT_AMBULATORY_CARE_PROVIDER_SITE_OTHER): Payer: Commercial Managed Care - HMO | Admitting: Podiatry

## 2015-06-07 DIAGNOSIS — M79673 Pain in unspecified foot: Secondary | ICD-10-CM | POA: Diagnosis not present

## 2015-06-07 DIAGNOSIS — B351 Tinea unguium: Secondary | ICD-10-CM | POA: Diagnosis not present

## 2015-06-07 NOTE — Progress Notes (Signed)
Patient ID: Andres [name], male   DOB: 05/20/1941, 74 y.o.   MRN: [id number] . Complaint:  Visit Type: Patient returns to my office for continued preventative foot care services. Complaint: Patient states" my nails have grown long and thick and become painful to walk and wear shoes" Patient has been diagnosed with DM with no foot complications. The patient presents for preventative foot care services. No changes to ROS  Podiatric Exam: Vascular: dorsalis pedis and posterior tibial pulses are palpable bilateral. Capillary return is immediate. Temperature gradient is WNL. Skin turgor WNL  Sensorium: Normal Semmes Weinstein monofilament test. Normal tactile sensation bilaterally. Nail Exam: Pt has thick disfigured discolored nails with subungual debris noted bilateral entire nail hallux through fifth toenails Ulcer Exam: There is no evidence of ulcer or pre-ulcerative changes or infection. Orthopedic Exam: Muscle tone and strength are WNL. No limitations in general ROM. No crepitus or effusions noted. Foot type and digits show no abnormalities. Bony prominences are unremarkable. Skin: No Porokeratosis. No infection or ulcers  Diagnosis:  Onychomycosis, , Pain in right toe, pain in left toes  Treatment & Plan Procedures and Treatment: Consent by patient was obtained for treatment procedures. The patient understood the discussion of treatment and procedures well. All questions were answered thoroughly reviewed. Debridement of mycotic and hypertrophic toenails, 1 through 5 bilateral and clearing of subungual debris. No ulceration, no infection noted.  Return Visit-Office Procedure: Patient instructed to return to the office for a follow up visit 3 months for continued evaluation and treatment. 

## 2015-07-21 ENCOUNTER — Encounter: Payer: Self-pay | Admitting: Family Medicine

## 2015-07-21 ENCOUNTER — Ambulatory Visit (INDEPENDENT_AMBULATORY_CARE_PROVIDER_SITE_OTHER): Payer: Commercial Managed Care - HMO | Admitting: Family Medicine

## 2015-07-21 VITALS — BP 104/60 | HR 73 | Temp 97.6°F | Wt 242.0 lb

## 2015-07-21 DIAGNOSIS — S6992XA Unspecified injury of left wrist, hand and finger(s), initial encounter: Secondary | ICD-10-CM | POA: Diagnosis not present

## 2015-07-21 MED ORDER — PREDNISONE 20 MG PO TABS: ORAL_TABLET | ORAL | Status: DC

## 2015-07-21 NOTE — Progress Notes (Signed)
   Subjective:    Patient ID: Andres [name], male    DOB: [birth year], 74 y.o.   MRN: [id number]  HPI  Hand injury: Pt presents to Saturday clinic with complaints of left hand pain, over his index/knuckle after hitting it on the swimming pol wall 5 days ago. He reprts it became swollen, but not red. The skin ws not broken. He has  Been using occasional ASA to help with pain and it has been effective. He states it has not become worse or better. He did use an icepack after it happen. He does have a h/o arthritis and diabetes. He is not mediated for his DM and his last a1c is 6.2. GFR 66. On plavix.   Past Medical History  Diagnosis Date  . Hyperlipidemia   . Hypertension   . Hepatitis   . Obesity, morbid ([place])   . Benign prostatic hypertrophy   . Cataracts, bilateral   . Carpal tunnel syndrome, bilateral   . Diverticulosis of colon   . Nephrolithiasis   . Low back pain   . ALLERGIC RHINITIS   . Head mass   . Diabetes mellitus type II   . Coronary artery disease     Dr. Claiborne Billings; 2D ECHO, 12/11/2011 - EF 50-55%, normal; NUCLEAR STRESS TEST, 10/16/2010 - no evidemce of inducible ischemia  . 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 Last nuc 2012 negative. 07/21/2013  . Inguinal hernia, Rt reduced, Lt present 07/21/2013  . Stroke ([place])   . GERD (gastroesophageal reflux disease)   . H/O hiatal hernia   . Mitral valve prolapse 11/24/2013    By echo dec 2014  . Hx of   sessile serrated colonic polyp 02/27/2015   No Known Allergies   Review of Systems Negative, with the exception of above mentioned in HPI     Objective:   Physical Exam BP 104/60 mmHg  Pulse 73  Temp(Src) 97.6 F (36.4 C)  Wt 242 lb (109.77 kg) Gen: Afebrile. No acute distress. Well developed, well nourished AAM.  HENT: AT. [place].MMM Eyes:Pupils  Equal Round Reactive to light, Extraocular movements intact,  Conjunctiva without redness, discharge or icterus. MSK: No erythema, skin intact, mild soft tissue swelling surround 1st metacarpal. Nontender to touch. Good ROM with minimal discomfort with full flexion. Neurovascularly intact distally.      Assessment & Plan:  1. Injury of left hand, initial encounter - does not appeared fractured or infected today. Likely inflammatory reaction secondary to trauma. Treat with prednisone burst.  - predniSONE (DELTASONE) 20 MG tablet; 60 mg x3 days, 40 mg x3 days, 20 mg x2 days, 10 mg x2 days  Dispense: 18 tablet; Refill: 0 - pt encouraged to follow up with PCP in 1 week if worsening.

## 2015-07-21 NOTE — Patient Instructions (Signed)
I have prescribed a steroid taper to help calm down the inflammation process after your injury. I want you to follo wup with her PCP in 1 week, sooner if no improvement.  I di not think you broke a bone or are infected.

## 2015-08-23 ENCOUNTER — Ambulatory Visit: Payer: Commercial Managed Care - HMO | Admitting: Podiatry

## 2015-08-23 ENCOUNTER — Ambulatory Visit (INDEPENDENT_AMBULATORY_CARE_PROVIDER_SITE_OTHER): Payer: Commercial Managed Care - HMO | Admitting: Podiatry

## 2015-08-23 ENCOUNTER — Encounter: Payer: Self-pay | Admitting: Podiatry

## 2015-08-23 DIAGNOSIS — B351 Tinea unguium: Secondary | ICD-10-CM | POA: Diagnosis not present

## 2015-08-23 DIAGNOSIS — M79673 Pain in unspecified foot: Secondary | ICD-10-CM | POA: Diagnosis not present

## 2015-08-23 NOTE — Progress Notes (Signed)
Patient ID: ANGELOS OTTMAR, male   DOB: 09/13/1940, 75 y.o.   MRN: [id number] . Complaint:  Visit Type: Patient returns to my office for continued preventative foot care services. Complaint: Patient states" my nails have grown long and thick and become painful to walk and wear shoes" Patient has been diagnosed with DM with no foot complications. The patient presents for preventative foot care services. No changes to ROS  Podiatric Exam: Vascular: dorsalis pedis and posterior tibial pulses are palpable bilateral. Capillary return is immediate. Temperature gradient is WNL. Skin turgor WNL  Sensorium: Normal Semmes Weinstein monofilament test. Normal tactile sensation bilaterally. Nail Exam: Pt has thick disfigured discolored nails with subungual debris noted bilateral entire nail hallux through fifth toenails Ulcer Exam: There is no evidence of ulcer or pre-ulcerative changes or infection. Orthopedic Exam: Muscle tone and strength are WNL. No limitations in general ROM. No crepitus or effusions noted. Foot type and digits show no abnormalities. Bony prominences are unremarkable. Skin: No Porokeratosis. No infection or ulcers  Diagnosis:  Onychomycosis, , Pain in right toe, pain in left toes  Treatment & Plan Procedures and Treatment: Consent by patient was obtained for treatment procedures. The patient understood the discussion of treatment and procedures well. All questions were answered thoroughly reviewed. Debridement of mycotic and hypertrophic toenails, 1 through 5 bilateral and clearing of subungual debris. No ulceration, no infection noted.  Return Visit-Office Procedure: Patient instructed to return to the office for a follow up visit 10 weeks for continued evaluation and treatment   Gardiner Barefoot DPM

## 2015-09-06 ENCOUNTER — Ambulatory Visit: Payer: Commercial Managed Care - HMO | Admitting: Podiatry

## 2015-10-30 ENCOUNTER — Telehealth: Payer: Self-pay | Admitting: Cardiovascular Disease

## 2015-10-30 NOTE — Telephone Encounter (Signed)
Returned call. No acute concerns, no active problems, pt realized he was due for 6 mo f/u and may have missed a recall notification. Pt aware I am sending to scheduler to schedule for Dr. Evette Georges next available and that we will continue to provide him w/ refills on medications.  Pt aware to call for acute concerns.

## 2015-10-30 NOTE — Telephone Encounter (Signed)
New message   Patient calling wanted to see Dr. Claiborne Billings has a recall  in the system for  10/2015 .   Patient is asking for something sooner with MD and blood work next available not until June.   Please advise.

## 2015-11-01 ENCOUNTER — Encounter: Payer: Self-pay | Admitting: Podiatry

## 2015-11-01 ENCOUNTER — Ambulatory Visit (INDEPENDENT_AMBULATORY_CARE_PROVIDER_SITE_OTHER): Payer: Commercial Managed Care - HMO | Admitting: Podiatry

## 2015-11-01 DIAGNOSIS — B351 Tinea unguium: Secondary | ICD-10-CM

## 2015-11-01 DIAGNOSIS — M79673 Pain in unspecified foot: Secondary | ICD-10-CM

## 2015-11-01 NOTE — Progress Notes (Signed)
Patient ID: QUENTARIUS KADRI, male   DOB: 03-12-1941, 75 y.o.   MRN: [id number] . Complaint:  Visit Type: Patient returns to my office for continued preventative foot care services. Complaint: Patient states" my nails have grown long and thick and become painful to walk and wear shoes" Patient has been diagnosed with DM with no foot complications. The patient presents for preventative foot care services. No changes to ROS  Podiatric Exam: Vascular: dorsalis pedis and posterior tibial pulses are palpable bilateral. Capillary return is immediate. Temperature gradient is WNL. Skin turgor WNL  Sensorium: Normal Semmes Weinstein monofilament test. Normal tactile sensation bilaterally. Nail Exam: Pt has thick disfigured discolored nails with subungual debris noted bilateral entire nail hallux through fifth toenails Ulcer Exam: There is no evidence of ulcer or pre-ulcerative changes or infection. Orthopedic Exam: Muscle tone and strength are WNL. No limitations in general ROM. No crepitus or effusions noted. Foot type and digits show no abnormalities. Bony prominences are unremarkable. Skin: No Porokeratosis. No infection or ulcers  Diagnosis:  Onychomycosis, , Pain in right toe, pain in left toes  Treatment & Plan Procedures and Treatment: Consent by patient was obtained for treatment procedures. The patient understood the discussion of treatment and procedures well. All questions were answered thoroughly reviewed. Debridement of mycotic and hypertrophic toenails, 1 through 5 bilateral and clearing of subungual debris. No ulceration, no infection noted.  Return Visit-Office Procedure: Patient instructed to return to the office for a follow up visit 10 weeks for continued evaluation and treatment   Gardiner Barefoot DPM

## 2015-11-21 ENCOUNTER — Telehealth: Payer: Self-pay | Admitting: *Deleted

## 2015-11-21 MED ORDER — FOLIC ACID 1 MG PO TABS: 1.0000 mg | ORAL_TABLET | Freq: Every day | ORAL | Status: DC

## 2015-11-21 NOTE — Telephone Encounter (Signed)
Receive call pt states he is out of his folic acid needing refill sent to [facility]. Sent electronically...Andres [name]

## 2015-11-22 ENCOUNTER — Other Ambulatory Visit (INDEPENDENT_AMBULATORY_CARE_PROVIDER_SITE_OTHER): Payer: Commercial Managed Care - HMO

## 2015-11-22 DIAGNOSIS — Z0189 Encounter for other specified special examinations: Secondary | ICD-10-CM

## 2015-11-22 DIAGNOSIS — Z0000 Encounter for general adult medical examination without abnormal findings: Secondary | ICD-10-CM

## 2015-11-22 DIAGNOSIS — E119 Type 2 diabetes mellitus without complications: Secondary | ICD-10-CM

## 2015-11-22 LAB — HEPATIC FUNCTION PANEL
ALT: 21 U/L (ref 0–53)
AST: 30 U/L (ref 0–37)
Albumin: 4.3 g/dL (ref 3.5–5.2)
Alkaline Phosphatase: 60 U/L (ref 39–117)
BILIRUBIN DIRECT: 0.2 mg/dL (ref 0.0–0.3)
BILIRUBIN TOTAL: 1 mg/dL (ref 0.2–1.2)
Total Protein: 6.3 g/dL (ref 6.0–8.3)

## 2015-11-22 LAB — BASIC METABOLIC PANEL
BUN: 17 mg/dL (ref 6–23)
CHLORIDE: 103 meq/L (ref 96–112)
CO2: 32 mEq/L (ref 19–32)
CREATININE: 1.33 mg/dL (ref 0.40–1.50)
Calcium: 9.6 mg/dL (ref 8.4–10.5)
GFR: 67.05 mL/min (ref >60.00)
Glucose, Bld: 104 mg/dL — ABNORMAL HIGH (ref 70–99)
POTASSIUM: 4.6 meq/L (ref 3.5–5.1)
Sodium: 140 mEq/L (ref 135–145)

## 2015-11-22 LAB — URINALYSIS, ROUTINE W REFLEX MICROSCOPIC
Bilirubin Urine: NEGATIVE
HGB URINE DIPSTICK: NEGATIVE
Ketones, ur: NEGATIVE
Leukocytes, UA: NEGATIVE
NITRITE: NEGATIVE
RBC / HPF: NONE SEEN (ref 0–?)
SPECIFIC GRAVITY, URINE: 1.01 (ref 1.000–1.030)
Total Protein, Urine: NEGATIVE
URINE GLUCOSE: NEGATIVE
Urobilinogen, UA: 0.2 (ref 0.0–1.0)
pH: 7 (ref 5.0–8.0)

## 2015-11-22 LAB — PSA: PSA: 0.41 ng/mL (ref 0.10–4.00)

## 2015-11-22 LAB — CBC WITH DIFFERENTIAL/PLATELET
BASOS PCT: 0.4 % (ref 0.0–3.0)
Basophils Absolute: 0 10*3/uL (ref 0.0–0.1)
EOS PCT: 2.2 % (ref 0.0–5.0)
Eosinophils Absolute: 0.1 10*3/uL (ref 0.0–0.7)
HEMATOCRIT: 39 % (ref 39.0–52.0)
HEMOGLOBIN: 13.2 g/dL (ref 13.0–17.0)
LYMPHS PCT: 42.5 % (ref 12.0–46.0)
Lymphs Abs: 1.8 10*3/uL (ref 0.7–4.0)
MCHC: 33.8 g/dL (ref 30.0–36.0)
MCV: 98.3 fl (ref 78.0–100.0)
Monocytes Absolute: 0.4 10*3/uL (ref 0.1–1.0)
Monocytes Relative: 8.9 % (ref 3.0–12.0)
NEUTROS ABS: 1.9 10*3/uL (ref 1.4–7.7)
Neutrophils Relative %: 46 % (ref 43.0–77.0)
Platelets: 184 10*3/uL (ref 150.0–400.0)
RBC: 3.96 Mil/uL — ABNORMAL LOW (ref 4.22–5.81)
RDW: 13.8 % (ref 11.5–15.5)
WBC: 4.2 10*3/uL (ref 4.0–10.5)

## 2015-11-22 LAB — LIPID PANEL
CHOL/HDL RATIO: 4
Cholesterol: 136 mg/dL (ref 0–200)
HDL: 37.9 mg/dL — AB (ref >39.00)
LDL CALC: 77 mg/dL (ref 0–99)
NONHDL: 98.38
Triglycerides: 106 mg/dL (ref 0.0–149.0)
VLDL: 21.2 mg/dL (ref 0.0–40.0)

## 2015-11-22 LAB — MICROALBUMIN / CREATININE URINE RATIO
Creatinine,U: 191.9 mg/dL
MICROALB/CREAT RATIO: 1.6 mg/g (ref 0.0–30.0)
Microalb, Ur: 3 mg/dL — ABNORMAL HIGH (ref 0.0–1.9)

## 2015-11-22 LAB — HEMOGLOBIN A1C: Hgb A1c MFr Bld: 6.6 % — ABNORMAL HIGH (ref 4.6–6.5)

## 2015-11-22 LAB — TSH: TSH: 0.56 u[IU]/mL (ref 0.35–4.50)

## 2015-11-29 ENCOUNTER — Telehealth: Payer: Self-pay

## 2015-11-29 ENCOUNTER — Ambulatory Visit (INDEPENDENT_AMBULATORY_CARE_PROVIDER_SITE_OTHER): Payer: Commercial Managed Care - HMO | Admitting: Internal Medicine

## 2015-11-29 ENCOUNTER — Encounter: Payer: Self-pay | Admitting: Internal Medicine

## 2015-11-29 VITALS — BP 110/60 | HR 64 | Temp 98.4°F | Resp 20 | Wt 240.0 lb

## 2015-11-29 DIAGNOSIS — E119 Type 2 diabetes mellitus without complications: Secondary | ICD-10-CM | POA: Diagnosis not present

## 2015-11-29 DIAGNOSIS — R6889 Other general symptoms and signs: Secondary | ICD-10-CM

## 2015-11-29 DIAGNOSIS — E785 Hyperlipidemia, unspecified: Secondary | ICD-10-CM | POA: Diagnosis not present

## 2015-11-29 DIAGNOSIS — M19042 Primary osteoarthritis, left hand: Secondary | ICD-10-CM

## 2015-11-29 DIAGNOSIS — Z0001 Encounter for general adult medical examination with abnormal findings: Secondary | ICD-10-CM

## 2015-11-29 DIAGNOSIS — I10 Essential (primary) hypertension: Secondary | ICD-10-CM

## 2015-11-29 MED ORDER — FOLIC ACID 1 MG PO TABS: 1.0000 mg | ORAL_TABLET | Freq: Every day | ORAL | Status: DC

## 2015-11-29 NOTE — Progress Notes (Signed)
Subjective:    Patient ID: Andres [name], male    DOB: 15-Nov-1940, 75 y.o.   MRN: [id number]  HPI  Here for wellness and f/u;  Overall doing ok;  Pt denies Chest pain, worsening SOB, DOE, wheezing, orthopnea, PND, worsening LE edema, palpitations, dizziness or syncope.  Pt denies neurological change such as new headache, facial or extremity weakness.  Pt denies polydipsia, polyuria, or low sugar symptoms. Pt states overall good compliance with treatment and medications, good tolerability, and has been trying to follow appropriate diet.  Pt denies worsening depressive symptoms, suicidal ideation or panic. No fever, night sweats, wt loss, loss of appetite, or other constitutional symptoms.  Pt states good ability with ADL's, has low fall risk, home safety reviewed and adequate, no other significant changes in hearing or vision, and only occasionally active with exercise.  Does have ongoing left hand arthritic pain, voltaren helps but expensive.  Has not tried pennsaid.  Prednisone given a recent visit led to some wt gain.  Hard to lose wt Wt Readings from Last 3 Encounters:  11/29/15 240 lb (108.863 kg)  07/21/15 242 lb (109.77 kg)  05/31/15 235 lb (106.595 kg)   Past Medical History  Diagnosis Date  . Hyperlipidemia   . Hypertension   . Hepatitis   . Obesity, morbid ([place])   . Benign prostatic hypertrophy   . Cataracts, bilateral   . Carpal tunnel syndrome, bilateral   . Diverticulosis of colon   . Nephrolithiasis   . Low back pain   . ALLERGIC RHINITIS   . Head mass   . Diabetes mellitus type II   . Coronary artery disease     Dr. Claiborne Billings; 2D ECHO, 12/11/2011 - EF 50-55%, normal; NUCLEAR STRESS TEST, 10/16/2010 - no evidemce of inducible ischemia  . 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 Last nuc 2012 negative.  07/21/2013  . Inguinal hernia, Rt reduced, Lt present 07/21/2013  . Stroke ([place])   . GERD (gastroesophageal reflux disease)   . H/O hiatal hernia   . Mitral valve prolapse 11/24/2013    By echo dec 2014  . Hx of   sessile serrated colonic polyp 02/27/2015   Past Surgical History  Procedure Laterality Date  . Appendectomy    . Coronary artery bypass graft    . Cardiac catheterization    . Cataract extraction Right   . Inguinal hernia repair Right 07/26/2013    Procedure: REPAIR  INCARCERATED RIGHT INGUINAL  HERNIA ;  Surgeon: Gwenyth Ober, MD;  Location: [place];  Service: General;  Laterality: Right;    reports that he quit smoking about 25 years ago. His smoking use included Cigarettes. He has never used smokeless tobacco. He reports that he does not drink alcohol or use illicit drugs. family history includes Alzheimer's disease in his father; Diabetes in his brother, mother, and sister; Heart attack in his brother, maternal grandmother, and mother; Hypertension in his maternal grandmother and son; Kidney disease in his brother and son; Pancreatic cancer in his brother. No Known Allergies Current Outpatient Prescriptions on File Prior to Visit  Medication Sig Dispense Refill  . acetaminophen (TYLENOL) 325 MG tablet Take 325 mg by mouth every 6 (six) hours as needed for mild pain. Reported on 07/21/2015    . Ascorbic Acid (VITAMIN C) 1000  MG tablet Take 1,000 mg by mouth daily.    . Aspirin (ADULT ASPIRIN LOW STRENGTH) 81 MG EC tablet Take 81 mg by mouth daily.      Marland Kitchen atorvastatin (LIPITOR) 40 MG tablet TAKE 1 TABLET EVERY DAY 90 tablet 3  . clopidogrel (PLAVIX) 75 MG tablet TAKE 1 TABLET EVERY DAY 90 tablet 3  . diclofenac sodium (VOLTAREN) 1 % GEL Apply 2 g topically as needed (for arthritis).     Marland Kitchen FEXOFENADINE HCL PO Take 1 tablet by mouth daily.    Marland Kitchen losartan-hydrochlorothiazide (HYZAAR) 50-12.5 MG tablet TAKE 1 TABLET EVERY DAY 90 tablet 3  . metoprolol (LOPRESSOR) 50 MG tablet TAKE  1 TABLET TWICE DAILY 180 tablet 3  . Multiple Vitamin (MULTIVITAMIN) capsule Take 1 capsule by mouth daily.     . Omega-3 Fatty Acids (OMEGA 3 PO) Take 360 mg by mouth 2 (two) times daily.    . tamsulosin (FLOMAX) 0.4 MG CAPS capsule TAKE 1 CAPSULE EVERY DAY 90 capsule 3   No current facility-administered medications on file prior to visit.   Review of Systems Constitutional: Negative for increased diaphoresis, or other activity, appetite or siginficant weight change other than noted HENT: Negative for worsening hearing loss, ear pain, facial swelling, mouth sores and neck stiffness.   Eyes: Negative for other worsening pain, redness or visual disturbance.  Respiratory: Negative for choking or stridor Cardiovascular: Negative for other chest pain and palpitations.  Gastrointestinal: Negative for worsening diarrhea, blood in stool, or abdominal distention Genitourinary: Negative for hematuria, flank pain or change in urine volume.  Musculoskeletal: Negative for myalgias or other joint complaints.  Skin: Negative for other color change and wound or drainage.  Neurological: Negative for syncope and numbness. other than noted Hematological: Negative for adenopathy. or other swelling Psychiatric/Behavioral: Negative for hallucinations, SI, self-injury, decreased concentration or other worsening agitation.      Objective:   Physical Exam BP 110/60 mmHg  Pulse 64  Temp(Src) 98.4 F (36.9 C) (Oral)  Resp 20  Wt 240 lb (108.863 kg)  SpO2 96% VS noted,  Constitutional: Pt is oriented to person, place, and time. Appears well-developed and well-nourished, in no significant distress Head: Normocephalic and atraumatic  Eyes: Conjunctivae and EOM are normal. Pupils are equal, round, and reactive to light Right Ear: External ear normal.  Left Ear: External ear normal Nose: Nose normal.  Mouth/Throat: Oropharynx is clear and moist  Neck: Normal range of motion. Neck supple. No JVD present. No  tracheal deviation present or significant neck LA or mass Cardiovascular: Normal rate, regular rhythm, normal heart sounds and intact distal pulses.   Pulmonary/Chest: Effort normal and breath sounds without rales or wheezing  Abdominal: Soft. Bowel sounds are normal. NT. No HSM  Musculoskeletal: Normal range of motion. Exhibits no edema Lymphadenopathy: Has no cervical adenopathy.  Neurological: Pt is alert and oriented to person, place, and time. Pt has normal reflexes. No cranial nerve deficit. Motor grossly intact Skin: Skin is warm and dry. No rash noted or new ulcers Left hand neurovasc intact, without swelling or tender joints Psychiatric:  Has normal mood and affect. Behavior is normal.    Lab Results  Component Value Date   WBC 4.2 11/22/2015   HGB 13.2 11/22/2015   HCT 39.0 11/22/2015   PLT 184.0 11/22/2015   GLUCOSE 104* 11/22/2015   CHOL 136 11/22/2015   TRIG 106.0 11/22/2015   HDL 37.90* 11/22/2015   LDLCALC 77 11/22/2015   ALT 21 11/22/2015  AST 30 11/22/2015   NA 140 11/22/2015   K 4.6 11/22/2015   CL 103 11/22/2015   CREATININE 1.33 11/22/2015   BUN 17 11/22/2015   CO2 32 11/22/2015   TSH 0.56 11/22/2015   PSA 0.41 11/22/2015   INR 1.08 07/21/2013   HGBA1C 6.6* 11/22/2015   MICROALBUR 3.0* 11/22/2015       Assessment & Plan:

## 2015-11-29 NOTE — Progress Notes (Signed)
Pre visit review using our clinic review tool, if applicable. No additional management support is needed unless otherwise documented below in the visit note. 

## 2015-11-29 NOTE — Patient Instructions (Addendum)
Please call if the Pennsaid is less expensive than the Voltaren gel (check with your pharmacy)  Please continue all other medications as before, and refills have been done if requested.  Please have the pharmacy call with any other refills you may need.  Please continue your efforts at being more active, low cholesterol diet, and weight control.  You are otherwise up to date with prevention measures today.  Please keep your appointments with your specialists as you may have planned  Please return in 6 months, or sooner if needed, with Lab testing done 3-5 days before

## 2015-11-29 NOTE — Assessment & Plan Note (Addendum)
Mild to mod, Ok for pennsaid prn if less expensive than volt gel,  to f/u any worsening symptoms or concerns  In addition to the time spent performing CPE, I spent an additional 40 minutes face to face,in which greater than 50% of this time was spent in counseling and coordination of care for patient's acute illness as documented.

## 2015-11-29 NOTE — Telephone Encounter (Signed)
Medication sent to pharmacy  

## 2015-12-02 NOTE — Assessment & Plan Note (Signed)

## 2015-12-02 NOTE — Assessment & Plan Note (Signed)
stable overall by history and exam, recent data reviewed with pt, and pt to continue medical treatment as before,  to f/u any worsening symptoms or concerns Lab Results  Component Value Date   HGBA1C 6.6* 11/22/2015    

## 2015-12-02 NOTE — Assessment & Plan Note (Signed)
stable overall by history and exam, recent data reviewed with pt, and pt to continue medical treatment as before,  to f/u any worsening symptoms or concerns Lab Results  Component Value Date   LDLCALC 77 11/22/2015

## 2015-12-02 NOTE — Assessment & Plan Note (Signed)
stable overall by history and exam, recent data reviewed with pt, and pt to continue medical treatment as before,  to f/u any worsening symptoms or concerns BP Readings from Last 3 Encounters:  11/29/15 110/60  07/21/15 104/60  05/31/15 128/70

## 2015-12-04 ENCOUNTER — Other Ambulatory Visit: Payer: Self-pay

## 2015-12-04 MED ORDER — DICLOFENAC SODIUM 1 % TD GEL: 2.0000 g | TRANSDERMAL | Status: DC | PRN

## 2015-12-06 ENCOUNTER — Telehealth: Payer: Self-pay | Admitting: *Deleted

## 2015-12-06 NOTE — Telephone Encounter (Signed)
Received call requesting call back from nurse concerning rx that was sent in for folic Acid not sure why rx was sent. Per chart pt had cpx 4/27, rx was sent by [facility]. Pls call pt to clarify...Andres [name]

## 2015-12-07 NOTE — Telephone Encounter (Signed)
Called to speak to patient, unable to reach left message to give [place] a call back

## 2015-12-10 MED ORDER — FOLIC ACID 1 MG PO TABS: 1.0000 mg | ORAL_TABLET | Freq: Every day | ORAL | Status: DC

## 2015-12-10 NOTE — Telephone Encounter (Signed)
Received  Call pt states [facility] only sent a 30 day on his folic acid. He is needing a 90 day. He states for a 30 day they will charge him $16 verses no charge for 90 day. Inform pt not sure why assistant sent script for 30 will update and send for 90 day...Andres [name]

## 2015-12-10 NOTE — Addendum Note (Signed)
Addended by: Earnstine Regal on: 12/10/2015 03:51 PM   Modules accepted: Orders

## 2015-12-11 ENCOUNTER — Telehealth: Payer: Self-pay | Admitting: *Deleted

## 2015-12-11 NOTE — Telephone Encounter (Signed)
Received call from [facility] wanting to verify directions on Voltaren Gel. Gave directions from rx 5/5, apply daily AS NEEDED FOR ARTHRITIS...Johny Chess

## 2016-01-04 ENCOUNTER — Ambulatory Visit (INDEPENDENT_AMBULATORY_CARE_PROVIDER_SITE_OTHER): Payer: Commercial Managed Care - HMO | Admitting: Cardiovascular Disease

## 2016-01-04 ENCOUNTER — Encounter: Payer: Self-pay | Admitting: Cardiovascular Disease

## 2016-01-04 VITALS — BP 138/70 | HR 60 | Ht 66.0 in | Wt 238.0 lb

## 2016-01-04 DIAGNOSIS — I2583 Coronary atherosclerosis due to lipid rich plaque: Principal | ICD-10-CM

## 2016-01-04 DIAGNOSIS — Z951 Presence of aortocoronary bypass graft: Secondary | ICD-10-CM

## 2016-01-04 DIAGNOSIS — E669 Obesity, unspecified: Secondary | ICD-10-CM | POA: Diagnosis not present

## 2016-01-04 DIAGNOSIS — E119 Type 2 diabetes mellitus without complications: Secondary | ICD-10-CM

## 2016-01-04 DIAGNOSIS — E785 Hyperlipidemia, unspecified: Secondary | ICD-10-CM

## 2016-01-04 DIAGNOSIS — I2510 Atherosclerotic heart disease of native coronary artery without angina pectoris: Secondary | ICD-10-CM

## 2016-01-04 DIAGNOSIS — I10 Essential (primary) hypertension: Secondary | ICD-10-CM

## 2016-01-04 NOTE — Patient Instructions (Signed)
Medication Instructions: Dr Claiborne Billings recommends that you continue on your current medications as directed. Please refer to the Current Medication list given to you today.  Labwork: NONE ORDERED  Testing/Procedures: 1. Exercise Nuclear Stress test in 1 year - Your physician has requested that you have en exercise stress myoview. For further information please visit [email or web address]. Please follow instruction sheet, as given.  Follow-up: Dr Claiborne Billings recommends that you schedule a follow-up appointment in 1 year. You will receive a reminder letter in the mail two months in advance. If you don't receive a letter, please call our office to schedule the follow-up appointment.  If you need a refill on your cardiac medications before your next appointment, please call your pharmacy.

## 2016-01-06 ENCOUNTER — Encounter: Payer: Self-pay | Admitting: Cardiovascular Disease

## 2016-01-06 NOTE — Progress Notes (Signed)
Patient ID: Andres [name], male   DOB: Nov 25, 1940, 75 y.o.   MRN: [id number]   HPI: Andres [name] is a 75 y.o. male who presents to the office today for a 9 month cardiology evaluation.   Andres [name] has established coronary as well a cerebral vascular disease. In 1997 he underwent CABG revascularization surgery x6 by Dr. Servando Snare and had a LIMA placed to the LAD, sequential vein to the intermediate and second diagonal, vein to the OM 2, sequential vein to the acute margin and PDA. 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 A nuclear perfusion study in March 2012 was unchanged and showed previously noted inferior scar. An echo Doppler study in May 2013 showed mild asymmetric left ventricular hypertrophy with an ejection fraction of 50-55%. There was mild posterior wall hypokinesis with mild inferior wall hypokinesis. He had moderate mitral regurgitation and mild aortic valve sclerosis with mild tricuspid insufficiency.    In December 2014 he was hospitalized with renal insufficiency, hypotension, and had mildly positive troponin level. I did review these hospital records.  He had a negative pharmacologic stress test. He was found to have small bowel obstruction with right inguinal hernia with herniation of the distal small bowel loop. On July 21 2013 he underwent surgery for this incarcerated right inguinal hernia with small bowel obstruction by Dr. Hulen Skains and tolerated that well from a cardiovascular standpoint.  He has remained fairly active.  He exercises 4 days a week and plays golf 2 days per week.  His weight has been fairly stable over the past year.  He specifically denies chest pain or shortness of breath.  He denies presyncope or syncope.  He is unaware of palpitations.  He presents for follow-up evaluation   Past Medical History  Diagnosis Date  . Hyperlipidemia   . Hypertension   . Hepatitis   . Obesity, morbid ([place])   . Benign prostatic hypertrophy   .  Cataracts, bilateral   . Carpal tunnel syndrome, bilateral   . Diverticulosis of colon   . Nephrolithiasis   . Low back pain   . ALLERGIC RHINITIS   . Head mass   . Diabetes mellitus type II   . Coronary artery disease     Dr. Claiborne Billings; 2D ECHO, 12/11/2011 - EF 50-55%, normal; NUCLEAR STRESS TEST, 10/16/2010 - no evidemce of inducible ischemia  . 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 Last nuc 2012 negative. 07/21/2013  . Inguinal hernia, Rt reduced, Lt present 07/21/2013  . Stroke ([place])   . GERD (gastroesophageal reflux disease)   . H/O hiatal hernia   . Mitral valve prolapse 11/24/2013    By echo dec 2014  . Hx of   sessile serrated colonic polyp 02/27/2015    Past Surgical History  Procedure Laterality Date  . Appendectomy    . Coronary artery bypass graft    . Cardiac catheterization    . Cataract extraction Right   . Inguinal hernia repair Right 07/26/2013    Procedure: REPAIR  INCARCERATED RIGHT INGUINAL  HERNIA ;  Surgeon: Gwenyth Ober, MD;  Location: [place];  Service: General;  Laterality: Right;    No Known Allergies  Current Outpatient Prescriptions  Medication Sig Dispense Refill  . acetaminophen (TYLENOL) 325 MG tablet Take 325 mg by mouth  every 6 (six) hours as needed for mild pain. Reported on 07/21/2015    . Ascorbic Acid (VITAMIN C) 1000 MG tablet Take 1,000 mg by mouth daily.    . Aspirin (ADULT ASPIRIN LOW STRENGTH) 81 MG EC tablet Take 81 mg by mouth daily.      Marland Kitchen atorvastatin (LIPITOR) 40 MG tablet TAKE 1 TABLET EVERY DAY 90 tablet 3  . clopidogrel (PLAVIX) 75 MG tablet TAKE 1 TABLET EVERY DAY 90 tablet 3  . diclofenac sodium (VOLTAREN) 1 % GEL Apply 2 g topically as needed (for arthritis). 100 g 3  . FEXOFENADINE HCL PO Take 1 tablet by mouth daily.    . folic acid (FOLVITE) 1 MG tablet  Take 1 tablet (1 mg total) by mouth daily. 90 tablet 3  . losartan-hydrochlorothiazide (HYZAAR) 50-12.5 MG tablet TAKE 1 TABLET EVERY DAY 90 tablet 3  . metoprolol (LOPRESSOR) 50 MG tablet TAKE 1 TABLET TWICE DAILY 180 tablet 3  . Multiple Vitamin (MULTIVITAMIN) capsule Take 1 capsule by mouth daily.     . Omega-3 Fatty Acids (OMEGA 3 PO) Take 360 mg by mouth 2 (two) times daily.    . tamsulosin (FLOMAX) 0.4 MG CAPS capsule TAKE 1 CAPSULE EVERY DAY 90 capsule 3   No current facility-administered medications for this visit.    Social history is notable that he is widowed and has 2 children one grandchild. He does try to walk her there is no tobacco or alcohol use.  ROS General: Negative; No fevers, chills, or night sweats; positive for obesity HEENT: Negative; No changes in vision or hearing, sinus congestion, difficulty swallowing Pulmonary: Negative; No cough, wheezing, shortness of breath, hemoptysis Cardiovascular: Negative; No chest pain, presyncope, syncope, palpitations GI: Negative; No nausea, vomiting, diarrhea, or abdominal pain GU: Negative; No dysuria, hematuria, or difficulty voiding Musculoskeletal: Negative; no myalgias, joint pain, or weakness Hematologic/Oncology: Negative; no easy bruising, bleeding Endocrine: Positive for metabolic syndrome; no heat/cold intolerance;  Neuro: Negative; no changes in balance, headaches Skin: Negative; No rashes or skin lesions Psychiatric: Negative; No behavioral problems, depression Sleep: Negative; No snoring, daytime sleepiness, hypersomnolence, bruxism, restless legs, hypnogognic hallucinations, no cataplexy Other comprehensive 14 point system review is negative.  PE BP 138/70 mmHg  Pulse 60  Ht _0  (1.676 m)  Wt 238 lb (107.956 kg)  BMI 38.43 kg/m2  SpO2 97%   Repeat blood pressure by me was 130/72.  Wt Readings from Last 3 Encounters:  01/04/16 238 lb (107.956 kg)  11/29/15 240 lb (108.863 kg)  07/21/15 242 lb (109.77  kg)   General: Alert, oriented, no distress.  HEENT: Normocephalic, atraumatic. Pupils round and reactive; sclera anicteric;no lid lag,  Nose without nasal septal hypertrophy Mouth/Parynx benign; Mallinpatti scale 3 Neck: Thick neck; no JVD, no carotid bruits with normal carotid up stroke. Lungs: clear to ausculatation and percussion; no wheezing or rales Chest wall: No tenderness to palpation Heart: RRR, s1 s2 normal 1/6 systolic murmur; no S3 gallop. No rub, thrills or heaves Abdomen: Moderate central adiposity; soft, nontender; no hepatosplenomehaly, BS+; abdominal aorta nontender and not dilated by palpation. Back: No CVA tenderness Pulses 2+ Extremities: no clubbing cyanosis or edema, Homan's sign negative  Neurologic: grossly nonfocal Psychological: Normal affect and mood.  ECG (independently read by me): Normal sinus rhythm at 60 bpm.  Nonspecific T changes.  PR interval 176 ms, and QTc interval 416 ms.  September 2016 ECG (independently read by me): Sinus bradycardia 51 bpm.  Nonspecific T changes.  February  2016 ECG: (Independently read by me):  Normal sinus rhythm with sinus arrhythmia.  Nonspecific T changes.  September 2015ECG: (Independently read by me): Normal sinus rhythm with sinus arrhythmia.  QTc interval 422 ms.  Inferolateral T wave abnormality.  08/15/2013 ECG: (Independently read by me): Normal sinus rhythm with mild sinus arrhythmia. Nonspecific T changes inferolaterally. Early repolarization. Normal intervals.  LABS:  BMP Latest Ref Rng 11/22/2015 05/10/2015 03/12/2015  Glucose 70 - 99 mg/dL 104(H) 102(H) 105(H)  BUN 6 - 23 mg/dL _0 Creatinine 0.40 - 1.50 mg/dL 1.33 1.35 1.21(H)  Sodium 135 - 145 mEq/L 140 141 139  Potassium 3.5 - 5.1 mEq/L 4.6 4.7 4.6  Chloride 96 - 112 mEq/L 103 103 103  CO2 19 - 32 mEq/L 32 32 29  Calcium 8.4 - 10.5 mg/dL 9.6 9.4 9.7   Hepatic Function Latest Ref Rng 11/22/2015 05/10/2015 03/12/2015  Total Protein 6.0 - 8.3 g/dL 6.3  6.6 6.3  Albumin 3.5 - 5.2 g/dL 4.3 4.1 4.2  AST 0 - 37 U/L _1 ALT 0 - 53 U/L _2 Alk Phosphatase 39 - 117 U/L 60 62 60  Total Bilirubin 0.2 - 1.2 mg/dL 1.0 0.6 0.8  Bilirubin, Direct 0.0 - 0.3 mg/dL 0.2 0.1 -   CBC Latest Ref Rng 11/22/2015 03/12/2015 11/30/2014  WBC 4.0 - 10.5 K/uL 4.2 4.7 5.0  Hemoglobin 13.0 - 17.0 g/dL 13.2 13.5 13.8  Hematocrit 39.0 - 52.0 % 39.0 40.0 40.7  Platelets 150.0 - 400.0 K/uL 184.0 176 196.0   Lab Results  Component Value Date   MCV 98.3 11/22/2015   MCV 97.1 03/12/2015   MCV 97.1 11/30/2014   Lab Results  Component Value Date   TSH 0.56 11/22/2015   Lab Results  Component Value Date   HGBA1C 6.6* 11/22/2015   Lipid Panel     Component Value Date/Time   CHOL 136 11/22/2015 1042   TRIG 106.0 11/22/2015 1042   HDL 37.90* 11/22/2015 1042   CHOLHDL 4 11/22/2015 1042   VLDL 21.2 11/22/2015 1042   LDLCALC 77 11/22/2015 1042   RADIOLOGY: No results found.    ASSESSMENT AND PLAN: Mr. Birman is a 75 year old African-American male who is 20 years status post CABG revascularization surgery. His stress test in March 2012 was unchanged from previously and revealed inferior scar without associated ischemia and his pharmacologic stress test in 2014 done preoperatively for prior to undergoing surgery for bowel obstruction was negative for pharmacologic stress-induced ischemia.  His ejection fraction was 53%.  There was mild inferolateral attenuation.  When I last saw him, his blood pressure was somewhat low and I reduced his metoprolol.  His blood pressure today is stable on current therapy consisting of losartan HCT 50/12.5, metoprolol 50 g in the morning and 25 mg in the evening.  He continues to be on atorvastatin 40 mg for hyperlipidemia.  Laboratory last month showed an LDL cholesterol of 77 with a total cholesterol of 136.  Triglycerides were good at 106.  He is continuing on dual antiplatelet therapy with aspirin and Plavix; there is no  bleeding.  His ECG is stable and shows sinus rhythm at 60 bpm. .  His last hemoglobin A1c was mildly increased at 6.6 and is consistent with possible early diabetes.  Weight loss was recommended with his moderate obesity and BMI of 38.43.  He continues to exercise regularly.  He will follow-up with his primary physician concerning potential diabetes.  He one  year, I am recommending that he undergo an exercise nuclear study to continue to assess graft patency, scar/ischemia.  I'll see him back in the office for follow-up evaluation.  Time spent: 25 minutes Troy Sine, MD, [facility]  01/06/2016 10:59 PM

## 2016-01-10 ENCOUNTER — Ambulatory Visit (INDEPENDENT_AMBULATORY_CARE_PROVIDER_SITE_OTHER): Payer: Commercial Managed Care - HMO | Admitting: Podiatry

## 2016-01-10 ENCOUNTER — Encounter: Payer: Self-pay | Admitting: Podiatry

## 2016-01-10 DIAGNOSIS — B351 Tinea unguium: Secondary | ICD-10-CM

## 2016-01-10 DIAGNOSIS — E119 Type 2 diabetes mellitus without complications: Secondary | ICD-10-CM

## 2016-01-10 DIAGNOSIS — M79673 Pain in unspecified foot: Secondary | ICD-10-CM

## 2016-01-10 NOTE — Progress Notes (Signed)
Patient ID: Andres [name], male   DOB: [birth year], 75 y.o.   MRN: [id number] . Complaint:  Visit Type: Patient returns to my office for continued preventative foot care services. Complaint: Patient states" my nails have grown long and thick and become painful to walk and wear shoes" Patient has been diagnosed with DM with no foot complications. The patient presents for preventative foot care services. No changes to ROS  Podiatric Exam: Vascular: dorsalis pedis and posterior tibial pulses are palpable bilateral. Capillary return is immediate. Temperature gradient is WNL. Skin turgor WNL  Sensorium: Normal Semmes Weinstein monofilament test. Normal tactile sensation bilaterally. Nail Exam: Pt has thick disfigured discolored nails with subungual debris noted bilateral entire nail hallux through fifth toenails Ulcer Exam: There is no evidence of ulcer or pre-ulcerative changes or infection. Orthopedic Exam: Muscle tone and strength are WNL. No limitations in general ROM. No crepitus or effusions noted. Foot type and digits show no abnormalities. Bony prominences are unremarkable. Skin: No Porokeratosis. No infection or ulcers  Diagnosis:  Onychomycosis, , Pain in right toe, pain in left toes  Treatment & Plan Procedures and Treatment: Consent by patient was obtained for treatment procedures. The patient understood the discussion of treatment and procedures well. All questions were answered thoroughly reviewed. Debridement of mycotic and hypertrophic toenails, 1 through 5 bilateral and clearing of subungual debris. No ulceration, no infection noted.  Return Visit-Office Procedure: Patient instructed to return to the office for a follow up visit 10 weeks for continued evaluation and treatment   Gardiner Barefoot DPM

## 2016-02-06 ENCOUNTER — Other Ambulatory Visit: Payer: Self-pay | Admitting: Internal Medicine

## 2016-03-20 ENCOUNTER — Ambulatory Visit: Payer: Commercial Managed Care - HMO | Admitting: Podiatry

## 2016-03-27 ENCOUNTER — Encounter: Payer: Self-pay | Admitting: Podiatry

## 2016-03-27 ENCOUNTER — Ambulatory Visit (INDEPENDENT_AMBULATORY_CARE_PROVIDER_SITE_OTHER): Payer: Commercial Managed Care - HMO | Admitting: Podiatry

## 2016-03-27 DIAGNOSIS — M79673 Pain in unspecified foot: Secondary | ICD-10-CM | POA: Diagnosis not present

## 2016-03-27 DIAGNOSIS — B351 Tinea unguium: Secondary | ICD-10-CM

## 2016-03-27 DIAGNOSIS — E119 Type 2 diabetes mellitus without complications: Secondary | ICD-10-CM

## 2016-03-27 NOTE — Progress Notes (Addendum)
Patient ID: CYLAN FOUCAULT, male   DOB: June 22, 1941, 75 y.o.   MRN: [id number] . Complaint:  Visit Type: Patient returns to my office for continued preventative foot care services. Complaint: Patient states" my nails have grown long and thick and become painful to walk and wear shoes" Patient has been diagnosed with DM with no foot complications. The patient presents for preventative foot care services. No changes to ROS  Podiatric Exam: Vascular: dorsalis pedis and posterior tibial pulses are palpable bilateral. Capillary return is immediate. Temperature gradient is WNL. Skin turgor WNL  Sensorium: Normal Semmes Weinstein monofilament test. Normal tactile sensation bilaterally. Nail Exam: Pt has thick disfigured discolored nails with subungual debris noted bilateral entire nail hallux through fifth toenails Ulcer Exam: There is no evidence of ulcer or pre-ulcerative changes or infection. Orthopedic Exam: Muscle tone and strength are WNL. No limitations in general ROM. No crepitus or effusions noted. Foot type and digits show no abnormalities.  HAV  B/L. Skin: No Porokeratosis. No infection or ulcers  Diagnosis:  Onychomycosis, , Pain in right toe, pain in left toes  Treatment & Plan Procedures and Treatment: Consent by patient was obtained for treatment procedures. The patient understood the discussion of treatment and procedures well. All questions were answered thoroughly reviewed. Debridement of mycotic and hypertrophic toenails, 1 through 5 bilateral and clearing of subungual debris. No ulceration, no infection noted.  Return Visit-Office Procedure: Patient instructed to return to the office for a follow up visit 10 weeks for continued evaluation and treatment   Gardiner Barefoot DPM

## 2016-04-11 ENCOUNTER — Other Ambulatory Visit: Payer: Self-pay | Admitting: *Deleted

## 2016-04-11 MED ORDER — FOLIC ACID 1 MG PO TABS: 1.0000 mg | ORAL_TABLET | Freq: Every day | ORAL | 2 refills | Status: DC

## 2016-04-11 NOTE — Telephone Encounter (Signed)
Rec'd call pt is requesting 90 day to be sent to [facility] on his folic Acid. In form pt sending rx electronically as we speak...Andres [name]

## 2016-05-10 ENCOUNTER — Ambulatory Visit (INDEPENDENT_AMBULATORY_CARE_PROVIDER_SITE_OTHER): Payer: Commercial Managed Care - HMO

## 2016-05-10 DIAGNOSIS — Z23 Encounter for immunization: Secondary | ICD-10-CM | POA: Diagnosis not present

## 2016-05-29 ENCOUNTER — Encounter: Payer: Self-pay | Admitting: Podiatry

## 2016-05-29 ENCOUNTER — Ambulatory Visit (INDEPENDENT_AMBULATORY_CARE_PROVIDER_SITE_OTHER): Payer: Commercial Managed Care - HMO | Admitting: Podiatry

## 2016-05-29 ENCOUNTER — Other Ambulatory Visit (INDEPENDENT_AMBULATORY_CARE_PROVIDER_SITE_OTHER): Payer: Commercial Managed Care - HMO

## 2016-05-29 VITALS — Ht 66.0 in | Wt 238.0 lb

## 2016-05-29 DIAGNOSIS — B351 Tinea unguium: Secondary | ICD-10-CM | POA: Diagnosis not present

## 2016-05-29 DIAGNOSIS — M79676 Pain in unspecified toe(s): Secondary | ICD-10-CM

## 2016-05-29 DIAGNOSIS — E119 Type 2 diabetes mellitus without complications: Secondary | ICD-10-CM

## 2016-05-29 LAB — LIPID PANEL
CHOLESTEROL: 113 mg/dL (ref 0–200)
HDL: 36.7 mg/dL — ABNORMAL LOW (ref >39.00)
LDL CALC: 55 mg/dL (ref 0–99)
NonHDL: 76.37
TRIGLYCERIDES: 107 mg/dL (ref 0.0–149.0)
Total CHOL/HDL Ratio: 3
VLDL: 21.4 mg/dL (ref 0.0–40.0)

## 2016-05-29 LAB — HEMOGLOBIN A1C: HEMOGLOBIN A1C: 6.3 % (ref 4.6–6.5)

## 2016-05-29 LAB — HEPATIC FUNCTION PANEL
ALBUMIN: 4.2 g/dL (ref 3.5–5.2)
ALK PHOS: 58 U/L (ref 39–117)
ALT: 20 U/L (ref 0–53)
AST: 30 U/L (ref 0–37)
Bilirubin, Direct: 0.1 mg/dL (ref 0.0–0.3)
TOTAL PROTEIN: 6.7 g/dL (ref 6.0–8.3)
Total Bilirubin: 0.6 mg/dL (ref 0.2–1.2)

## 2016-05-29 LAB — BASIC METABOLIC PANEL
BUN: 18 mg/dL (ref 6–23)
CALCIUM: 9.6 mg/dL (ref 8.4–10.5)
CO2: 34 meq/L — AB (ref 19–32)
Chloride: 104 mEq/L (ref 96–112)
Creatinine, Ser: 1.35 mg/dL (ref 0.40–1.50)
GFR: 65.81 mL/min (ref >60.00)
Glucose, Bld: 98 mg/dL (ref 70–99)
Potassium: 4.7 mEq/L (ref 3.5–5.1)
SODIUM: 142 meq/L (ref 135–145)

## 2016-05-29 NOTE — Progress Notes (Signed)
Patient ID: JAYLIN BLANE, male   DOB: [birth year], 75 y.o.   MRN: [id number] . Complaint:  Visit Type: Patient returns to my office for continued preventative foot care services. Complaint: Patient states" my nails have grown long and thick and become painful to walk and wear shoes" Patient has been diagnosed with DM with no foot complications. The patient presents for preventative foot care services. No changes to ROS  Podiatric Exam: Vascular: dorsalis pedis and posterior tibial pulses are palpable bilateral. Capillary return is immediate. Temperature gradient is WNL. Skin turgor WNL  Sensorium: Normal Semmes Weinstein monofilament test. Normal tactile sensation bilaterally. Nail Exam: Pt has thick disfigured discolored nails with subungual debris noted bilateral entire nail hallux through fifth toenails Ulcer Exam: There is no evidence of ulcer or pre-ulcerative changes or infection. Orthopedic Exam: Muscle tone and strength are WNL. No limitations in general ROM. No crepitus or effusions noted. Foot type and digits show no abnormalities.  HAV  B/L. Skin: No Porokeratosis. No infection or ulcers  Diagnosis:  Onychomycosis, , Pain in right toe, pain in left toes  Treatment & Plan Procedures and Treatment: Consent by patient was obtained for treatment procedures. The patient understood the discussion of treatment and procedures well. All questions were answered thoroughly reviewed. Debridement of mycotic and hypertrophic toenails, 1 through 5 bilateral and clearing of subungual debris. No ulceration, no infection noted.  Return Visit-Office Procedure: Patient instructed to return to the office for a follow up visit 10 weeks for continued evaluation and treatment   Gardiner Barefoot DPM

## 2016-06-05 ENCOUNTER — Ambulatory Visit (INDEPENDENT_AMBULATORY_CARE_PROVIDER_SITE_OTHER): Payer: Commercial Managed Care - HMO | Admitting: Internal Medicine

## 2016-06-05 ENCOUNTER — Encounter: Payer: Self-pay | Admitting: Internal Medicine

## 2016-06-05 VITALS — BP 136/72 | HR 60 | Temp 98.4°F | Resp 20 | Wt 235.0 lb

## 2016-06-05 DIAGNOSIS — E785 Hyperlipidemia, unspecified: Secondary | ICD-10-CM | POA: Diagnosis not present

## 2016-06-05 DIAGNOSIS — I10 Essential (primary) hypertension: Secondary | ICD-10-CM

## 2016-06-05 DIAGNOSIS — E119 Type 2 diabetes mellitus without complications: Secondary | ICD-10-CM

## 2016-06-05 DIAGNOSIS — Z0001 Encounter for general adult medical examination with abnormal findings: Secondary | ICD-10-CM

## 2016-06-05 NOTE — Progress Notes (Signed)
Subjective:    Patient ID: Andres [name], male    DOB: 12-26-1940, 75 y.o.   MRN: [id number]  HPI  Here to f/u; overall doing ok,  Pt denies chest pain, increasing sob or doe, wheezing, orthopnea, PND, increased LE swelling, palpitations, dizziness or syncope.  Pt denies new neurological symptoms such as new headache, or facial or extremity weakness or numbness.  Pt denies polydipsia, polyuria, or low sugar episode.   Pt denies new neurological symptoms such as new headache, or facial or extremity weakness or numbness.   Pt states overall good compliance with meds, mostly trying to follow appropriate diet, with wt overall stable,  but little exercise however. No other significant change in hx. CBG's in low 100's.   Past Medical History:  Diagnosis Date  . ALLERGIC RHINITIS   . Benign prostatic hypertrophy   . 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 Last nuc 2012 negative. 07/21/2013  . Carpal tunnel syndrome, bilateral   . Cataracts, bilateral   . Coronary artery disease    Dr. Claiborne Billings; 2D ECHO, 12/11/2011 - EF 50-55%, normal; NUCLEAR STRESS TEST, 10/16/2010 - no evidemce of inducible ischemia  . Diabetes mellitus type II   . Diverticulosis of colon   . GERD (gastroesophageal reflux disease)   . H/O hiatal hernia   . Head mass   . Hepatitis   . Hx of   sessile serrated colonic polyp 02/27/2015  . Hyperlipidemia   . Hypertension   . Inguinal hernia, Rt reduced, Lt present 07/21/2013  . Low back pain   . Mitral valve prolapse 11/24/2013   By echo dec 2014  . Nephrolithiasis   . Obesity, morbid ([place])   . Occlusion and stenosis of carotid artery without mention of cerebral infarction    CAROTID DOPPLER, 03/18/2012 - srable, mild, hard, plaque noted, bilaterally  . Stroke [facility])    Past Surgical History:  Procedure Laterality Date  . APPENDECTOMY    . CARDIAC CATHETERIZATION    . CATARACT EXTRACTION Right   . CORONARY ARTERY BYPASS GRAFT    . INGUINAL HERNIA REPAIR Right  07/26/2013   Procedure: REPAIR  INCARCERATED RIGHT INGUINAL  HERNIA ;  Surgeon: Gwenyth Ober, MD;  Location: [place];  Service: General;  Laterality: Right;    reports that he quit smoking about 26 years ago. His smoking use included Cigarettes. He has never used smokeless tobacco. He reports that he does not drink alcohol or use drugs. family history includes Alzheimer's disease in his father; Diabetes in his brother, mother, and sister; Heart attack in his brother, maternal grandmother, and mother; Hypertension in his maternal grandmother and son; Kidney disease in his brother and son; Pancreatic cancer in his brother. No Known Allergies Current Outpatient Prescriptions on File Prior to Visit  Medication Sig Dispense Refill  . acetaminophen (TYLENOL) 325 MG tablet Take 325 mg by mouth every 6 (six) hours as needed for mild pain. Reported on 07/21/2015    . Ascorbic Acid (VITAMIN C) 1000 MG tablet Take 1,000 mg by mouth daily.    . Aspirin (ADULT ASPIRIN LOW STRENGTH) 81 MG EC tablet Take 81 mg by mouth daily.      Marland Kitchen atorvastatin (LIPITOR) 40 MG tablet TAKE 1 TABLET EVERY DAY 90 tablet 3  . clopidogrel (PLAVIX) 75 MG tablet TAKE 1 TABLET EVERY DAY 90 tablet 3  . diclofenac sodium (VOLTAREN) 1 % GEL Apply 2 g topically as needed (for arthritis). 100 g 3  .  FEXOFENADINE HCL PO Take 1 tablet by mouth daily.    . folic acid (FOLVITE) 1 MG tablet Take 1 tablet (1 mg total) by mouth daily. 90 tablet 2  . losartan-hydrochlorothiazide (HYZAAR) 50-12.5 MG tablet TAKE 1 TABLET EVERY DAY 90 tablet 3  . metoprolol (LOPRESSOR) 50 MG tablet TAKE 1 TABLET TWICE DAILY 180 tablet 3  . Multiple Vitamin (MULTIVITAMIN) capsule Take 1 capsule by mouth daily.     . Omega-3 Fatty Acids (OMEGA 3 PO) Take 360 mg by mouth 2 (two) times daily.    . tamsulosin (FLOMAX) 0.4 MG CAPS capsule TAKE 1 CAPSULE EVERY DAY 90 capsule 3   No current facility-administered medications on file prior to visit.    Review of Systems   Constitutional: Negative for unusual diaphoresis or night sweats HENT: Negative for ear swelling or discharge Eyes: Negative for worsening visual haziness  Respiratory: Negative for choking and stridor.   Gastrointestinal: Negative for distension or worsening eructation Genitourinary: Negative for retention or change in urine volume.  Musculoskeletal: Negative for other MSK pain or swelling Skin: Negative for color change and worsening wound Neurological: Negative for tremors and numbness other than noted  Psychiatric/Behavioral: Negative for decreased concentration or agitation other than above   All other system neg per pt    Objective:   Physical Exam BP 136/72   Pulse 60   Temp 98.4 F (36.9 C) (Oral)   Resp 20   Wt 235 lb (106.6 kg)   SpO2 96%   BMI 37.93 kg/m  VS noted,  Constitutional: Pt appears in no apparent distress HENT: Head: NCAT.  Right Ear: External ear normal.  Left Ear: External ear normal.  Eyes: . Pupils are equal, round, and reactive to light. Conjunctivae and EOM are normal Neck: Normal range of motion. Neck supple.  Cardiovascular: Normal rate and regular rhythm.   Pulmonary/Chest: Effort normal and breath sounds without rales or wheezing.  Neurological: Pt is alert. Not confused , motor grossly intact Skin: Skin is warm. No rash, no LE edema Psychiatric: Pt behavior is normal. No agitation.  No other significant exam findings       Assessment & Plan:

## 2016-06-05 NOTE — Assessment & Plan Note (Signed)
stable overall by history and exam, recent data reviewed with pt, and pt to continue medical treatment as before,  to f/u any worsening symptoms or concerns Lab Results  Component Value Date   HGBA1C 6.3 05/29/2016

## 2016-06-05 NOTE — Assessment & Plan Note (Signed)
stable overall by history and exam, recent data reviewed with pt, and pt to continue medical treatment as before,  to f/u any worsening symptoms or concerns BP Readings from Last 3 Encounters:  06/05/16 136/72  01/04/16 138/70  11/29/15 110/60

## 2016-06-05 NOTE — Assessment & Plan Note (Signed)
stable overall by history and exam, recent data reviewed with pt, and pt to continue medical treatment as before,  to f/u any worsening symptoms or concerns Lab Results  Component Value Date   LDLCALC 55 05/29/2016

## 2016-06-05 NOTE — Patient Instructions (Addendum)
Please continue all other medications as before, and refills have been done if requested.  Please have the pharmacy call with any other refills you may need.  Please continue your efforts at being more active, low cholesterol diet, and weight control.  You are otherwise up to date with prevention measures today.  Please keep your appointments with your specialists as you may have planned  Please return in 6 months, or sooner if needed, with Lab testing done 3-5 days before  

## 2016-06-05 NOTE — Progress Notes (Signed)
Pre visit review using our clinic review tool, if applicable. No additional management support is needed unless otherwise documented below in the visit note. 

## 2016-07-23 ENCOUNTER — Other Ambulatory Visit: Payer: Self-pay | Admitting: Internal Medicine

## 2016-08-28 ENCOUNTER — Ambulatory Visit (INDEPENDENT_AMBULATORY_CARE_PROVIDER_SITE_OTHER): Payer: Medicare PPO | Admitting: Podiatry

## 2016-08-28 ENCOUNTER — Encounter: Payer: Self-pay | Admitting: Podiatry

## 2016-08-28 VITALS — Ht 66.0 in | Wt 236.0 lb

## 2016-08-28 DIAGNOSIS — E119 Type 2 diabetes mellitus without complications: Secondary | ICD-10-CM

## 2016-08-28 DIAGNOSIS — B351 Tinea unguium: Secondary | ICD-10-CM

## 2016-08-28 NOTE — Progress Notes (Addendum)
Patient ID: Andres [name], male   DOB: [birth year], 76 y.o.   MRN: [id number] . Complaint:  Visit Type: Patient returns to my office for continued preventative foot care services. Complaint: Patient states" my nails have grown long and thick and become painful to walk and wear shoes" Patient has been diagnosed with DM with no foot complications. The patient presents for preventative foot care services. No changes to ROS  Podiatric Exam: Vascular: dorsalis pedis and posterior tibial pulses are palpable bilateral. Capillary return is immediate. Temperature gradient is WNL. Skin turgor WNL  Sensorium: Normal Semmes Weinstein monofilament test. Normal tactile sensation bilaterally. Nail Exam: Pt has thick disfigured discolored nails with subungual debris noted bilateral entire nail hallux through fifth toenails Ulcer Exam: There is no evidence of ulcer or pre-ulcerative changes or infection. Orthopedic Exam: Muscle tone and strength are WNL. No limitations in general ROM. No crepitus or effusions noted. Foot type and digits show no abnormalities.  HAV  B/L. Skin: No Porokeratosis. No infection or ulcers  Diagnosis:  Onychomycosis, , Pain in right toe, pain in left toes  Treatment & Plan Procedures and Treatment: Consent by patient was obtained for treatment procedures. The patient understood the discussion of treatment and procedures well. All questions were answered thoroughly reviewed. Debridement of mycotic and hypertrophic toenails, 1 through 5 bilateral and clearing of subungual debris. No ulceration, no infection noted. Patient not interested in diabetic shoes. Return Visit-Office Procedure: Patient instructed to return to the office for a follow up visit 10 weeks for continued evaluation and treatment   Gardiner Barefoot DPM

## 2016-11-04 ENCOUNTER — Encounter: Payer: Self-pay | Admitting: Internal Medicine

## 2016-11-04 ENCOUNTER — Other Ambulatory Visit (INDEPENDENT_AMBULATORY_CARE_PROVIDER_SITE_OTHER): Payer: Commercial Managed Care - HMO

## 2016-11-04 DIAGNOSIS — Z0001 Encounter for general adult medical examination with abnormal findings: Secondary | ICD-10-CM

## 2016-11-04 LAB — CBC WITH DIFFERENTIAL/PLATELET
BASOS ABS: 0 10*3/uL (ref 0.0–0.1)
Basophils Relative: 0.4 % (ref 0.0–3.0)
Eosinophils Absolute: 0.1 10*3/uL (ref 0.0–0.7)
Eosinophils Relative: 2.5 % (ref 0.0–5.0)
HCT: 37.1 % — ABNORMAL LOW (ref 39.0–52.0)
HEMOGLOBIN: 12.5 g/dL — AB (ref 13.0–17.0)
LYMPHS ABS: 2.5 10*3/uL (ref 0.7–4.0)
Lymphocytes Relative: 54.5 % — ABNORMAL HIGH (ref 12.0–46.0)
MCHC: 33.6 g/dL (ref 30.0–36.0)
MCV: 98.6 fl (ref 78.0–100.0)
MONO ABS: 0.4 10*3/uL (ref 0.1–1.0)
Monocytes Relative: 8.5 % (ref 3.0–12.0)
NEUTROS PCT: 34.1 % — AB (ref 43.0–77.0)
Neutro Abs: 1.5 10*3/uL (ref 1.4–7.7)
Platelets: 159 10*3/uL (ref 150.0–400.0)
RBC: 3.77 Mil/uL — AB (ref 4.22–5.81)
RDW: 13.8 % (ref 11.5–15.5)
WBC: 4.5 10*3/uL (ref 4.0–10.5)

## 2016-11-04 LAB — URINALYSIS, ROUTINE W REFLEX MICROSCOPIC
Bilirubin Urine: NEGATIVE
HGB URINE DIPSTICK: NEGATIVE
KETONES UR: NEGATIVE
LEUKOCYTES UA: NEGATIVE
Nitrite: NEGATIVE
RBC / HPF: NONE SEEN (ref 0–?)
Specific Gravity, Urine: 1.015 (ref 1.000–1.030)
TOTAL PROTEIN, URINE-UPE24: NEGATIVE
UROBILINOGEN UA: 0.2 (ref 0.0–1.0)
Urine Glucose: NEGATIVE
pH: 6 (ref 5.0–8.0)

## 2016-11-04 LAB — TSH: TSH: 1.02 u[IU]/mL (ref 0.35–4.50)

## 2016-11-04 LAB — BASIC METABOLIC PANEL
BUN: 18 mg/dL (ref 6–23)
CO2: 30 meq/L (ref 19–32)
Calcium: 9.1 mg/dL (ref 8.4–10.5)
Chloride: 106 mEq/L (ref 96–112)
Creatinine, Ser: 1.31 mg/dL (ref 0.40–1.50)
GFR: 68.06 mL/min (ref >60.00)
GLUCOSE: 102 mg/dL — AB (ref 70–99)
POTASSIUM: 4.4 meq/L (ref 3.5–5.1)
SODIUM: 141 meq/L (ref 135–145)

## 2016-11-04 LAB — LIPID PANEL
Cholesterol: 120 mg/dL (ref 0–200)
HDL: 34.7 mg/dL — ABNORMAL LOW (ref >39.00)
LDL CALC: 69 mg/dL (ref 0–99)
NonHDL: 85.53
TRIGLYCERIDES: 82 mg/dL (ref 0.0–149.0)
Total CHOL/HDL Ratio: 3
VLDL: 16.4 mg/dL (ref 0.0–40.0)

## 2016-11-04 LAB — HEPATIC FUNCTION PANEL
ALBUMIN: 4.1 g/dL (ref 3.5–5.2)
ALK PHOS: 60 U/L (ref 39–117)
ALT: 18 U/L (ref 0–53)
AST: 27 U/L (ref 0–37)
Bilirubin, Direct: 0.1 mg/dL (ref 0.0–0.3)
Total Bilirubin: 0.5 mg/dL (ref 0.2–1.2)
Total Protein: 6 g/dL (ref 6.0–8.3)

## 2016-11-04 LAB — PSA: PSA: 0.57 ng/mL (ref 0.10–4.00)

## 2016-11-06 ENCOUNTER — Ambulatory Visit: Payer: Medicare PPO | Admitting: Podiatry

## 2016-11-13 ENCOUNTER — Ambulatory Visit (INDEPENDENT_AMBULATORY_CARE_PROVIDER_SITE_OTHER): Payer: Medicare PPO | Admitting: Podiatry

## 2016-11-13 ENCOUNTER — Encounter: Payer: Self-pay | Admitting: Podiatry

## 2016-11-13 DIAGNOSIS — B351 Tinea unguium: Secondary | ICD-10-CM

## 2016-11-13 DIAGNOSIS — M79676 Pain in unspecified toe(s): Secondary | ICD-10-CM | POA: Diagnosis not present

## 2016-11-13 NOTE — Progress Notes (Signed)
Patient ID: Andres [name], male   DOB: 03/24/1941, 76 y.o.   MRN: [id number] . Complaint:  Visit Type: Patient returns to my office for continued preventative foot care services. Complaint: Patient states" my nails have grown long and thick and become painful to walk and wear shoes" Patient has been diagnosed with DM with no foot complications. The patient presents for preventative foot care services. No changes to ROS  Podiatric Exam: Vascular: dorsalis pedis and posterior tibial pulses are palpable bilateral. Capillary return is immediate. Temperature gradient is WNL. Skin turgor WNL  Sensorium: Normal Semmes Weinstein monofilament test. Normal tactile sensation bilaterally. Nail Exam: Pt has thick disfigured discolored nails with subungual debris noted bilateral entire nail hallux through fifth toenails Ulcer Exam: There is no evidence of ulcer or pre-ulcerative changes or infection. Orthopedic Exam: Muscle tone and strength are WNL. No limitations in general ROM. No crepitus or effusions noted. Foot type and digits show no abnormalities.  HAV  B/L. Skin: No Porokeratosis. No infection or ulcers  Diagnosis:  Onychomycosis, , Pain in right toe, pain in left toes  Treatment & Plan Procedures and Treatment: Consent by patient was obtained for treatment procedures. The patient understood the discussion of treatment and procedures well. All questions were answered thoroughly reviewed. Debridement of mycotic and hypertrophic toenails, 1 through 5 bilateral and clearing of subungual debris. No ulceration, no infection noted. Patient not interested in diabetic shoes. Return Visit-Office Procedure: Patient instructed to return to the office for a follow up visit 10 weeks for continued evaluation and treatment   Shourya Macpherson DPM 

## 2016-11-28 ENCOUNTER — Other Ambulatory Visit: Payer: Self-pay | Admitting: Internal Medicine

## 2016-12-03 ENCOUNTER — Ambulatory Visit (INDEPENDENT_AMBULATORY_CARE_PROVIDER_SITE_OTHER): Payer: Medicare PPO | Admitting: Internal Medicine

## 2016-12-03 ENCOUNTER — Encounter: Payer: Self-pay | Admitting: Internal Medicine

## 2016-12-03 VITALS — BP 116/68 | HR 63 | Ht 65.0 in | Wt 234.0 lb

## 2016-12-03 DIAGNOSIS — Z0000 Encounter for general adult medical examination without abnormal findings: Secondary | ICD-10-CM

## 2016-12-03 MED ORDER — DICLOFENAC SODIUM 1 % TD GEL: 2.0000 g | TRANSDERMAL | 3 refills | Status: DC | PRN

## 2016-12-03 NOTE — Patient Instructions (Signed)
Please continue all other medications as before, and refills have been done if requested - the voltaren  Please have the pharmacy call with any other refills you may need.  Please continue your efforts at being more active, low cholesterol diet, and weight control.  You are otherwise up to date with prevention measures today.  Please keep your appointments with your specialists as you may have planned  Please return in 6 months, or sooner if needed

## 2016-12-03 NOTE — Progress Notes (Signed)
Pre visit review using our clinic review tool, if applicable. No additional management support is needed unless otherwise documented below in the visit note. 

## 2016-12-03 NOTE — Progress Notes (Signed)
Subjective:    Patient ID: Andres [name], male    DOB: [birth year], 76 y.o.   MRN: [id number]  HPI  Here for wellness and f/u;  Overall doing ok;  Pt denies Chest pain, worsening SOB, DOE, wheezing, orthopnea, PND, worsening LE edema, palpitations, dizziness or syncope.  Pt denies neurological change such as new headache, facial or extremity weakness.  Pt denies polydipsia, polyuria, or low sugar symptoms. Pt states overall good compliance with treatment and medications, good tolerability, and has been trying to follow appropriate diet.  Pt denies worsening depressive symptoms, suicidal ideation or panic. No fever, night sweats, wt loss, loss of appetite, or other constitutional symptoms.  Pt states good ability with ADL's, has low fall risk, home safety reviewed and adequate, no other significant changes in hearing or vision, and fairly active with exercise with 1 hr at gym 4 days per wk, and golf 2 days per wk, Checks [facility] occasional[facility] usually about 11 am, usually < 140/90  As only very rare urninary leakage. Denies urinary symptoms such as dysuria, frequency, urgency, flank pain, hematuria or n/v, fever, chills.  Also seen per podiatry for nail care  Past Medical History:  Diagnosis Date  . ALLERGIC RHINITIS   . Benign prostatic hypertrophy   . 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 Last nuc 2012 negative. 07/21/2013  . Carpal tunnel syndrome, bilateral   . Cataracts, bilateral   . Coronary artery disease    Dr. Claiborne Billings; 2D ECHO, 12/11/2011 - EF 50-55%, normal; NUCLEAR STRESS TEST, 10/16/2010 - no evidemce of inducible ischemia  . Diabetes mellitus type II   . Diverticulosis of colon   . GERD (gastroesophageal reflux disease)   . H/O hiatal hernia   . Head mass   . Hepatitis   . Hx of   sessile serrated colonic polyp 02/27/2015  . Hyperlipidemia   . Hypertension   . Inguinal hernia, Rt reduced, Lt present 07/21/2013  . Low back pain   . Mitral valve prolapse 11/24/2013   By echo dec 2014  . Nephrolithiasis   . Obesity, morbid ([place])   . Occlusion and stenosis of carotid artery without mention of cerebral infarction    CAROTID DOPPLER, 03/18/2012 - srable, mild, hard, plaque noted, bilaterally  . Stroke [facility])    Past Surgical History:  Procedure Laterality Date  . APPENDECTOMY    . CARDIAC CATHETERIZATION    . CATARACT EXTRACTION Right   . CORONARY ARTERY BYPASS GRAFT    . INGUINAL HERNIA REPAIR Right 07/26/2013   Procedure: REPAIR  INCARCERATED RIGHT INGUINAL  HERNIA ;  Surgeon: Gwenyth Ober, MD;  Location: [place];  Service: General;  Laterality: Right;    reports that he quit smoking about 26 years ago. His smoking use included Cigarettes. He has never used smokeless tobacco. He reports that he does not drink alcohol or use drugs. family history includes Alzheimer's disease in his father; Diabetes in his brother, mother, and sister; Heart attack in his brother, maternal grandmother, and mother; Hypertension in his maternal grandmother and son; Kidney disease in his brother and son; Pancreatic cancer in his brother. No Known Allergies Current Outpatient Prescriptions on File Prior to Visit  Medication Sig Dispense Refill  . acetaminophen (TYLENOL) 325 MG tablet Take 325 mg by mouth every 6 (six) hours as needed for mild pain. Reported on 07/21/2015    . Ascorbic Acid (VITAMIN C) 1000 MG tablet Take 1,000 mg by mouth  daily.    . Aspirin (ADULT ASPIRIN LOW STRENGTH) 81 MG EC tablet Take 81 mg by mouth daily.      Marland Kitchen atorvastatin (LIPITOR) 40 MG tablet TAKE 1 TABLET EVERY DAY 90 tablet 1  . clopidogrel (PLAVIX) 75 MG tablet TAKE 1 TABLET EVERY DAY 90 tablet 1  . FEXOFENADINE HCL PO Take 1 tablet by mouth daily.    . folic acid (FOLVITE) 1 MG tablet Take 1 tablet (1 mg total) by mouth daily. 90 tablet 2  . losartan-hydrochlorothiazide (HYZAAR) 50-12.5 MG tablet TAKE 1 TABLET EVERY DAY 90 tablet 1  . metoprolol (LOPRESSOR) 50 MG tablet TAKE 1 TABLET TWICE DAILY  180 tablet 1  . Multiple Vitamin (MULTIVITAMIN) capsule Take 1 capsule by mouth daily.     . Omega-3 Fatty Acids (OMEGA 3 PO) Take 360 mg by mouth 2 (two) times daily.    . tamsulosin (FLOMAX) 0.4 MG CAPS capsule TAKE 1 CAPSULE EVERY DAY 90 capsule 1   No current facility-administered medications on file prior to visit.    Review of Systems Constitutional: Negative for other unusual diaphoresis, sweats, appetite or weight changes HENT: Negative for other worsening hearing loss, ear pain, facial swelling, mouth sores or neck stiffness.   Eyes: Negative for other worsening pain, redness or other visual disturbance.  Respiratory: Negative for other stridor or swelling Cardiovascular: Negative for other palpitations or other chest pain  Gastrointestinal: Negative for worsening diarrhea or loose stools, blood in stool, distention or other pain Genitourinary: Negative for hematuria, flank pain or other change in urine volume.  Musculoskeletal: Negative for myalgias or other joint swelling.  Skin: Negative for other color change, or other wound or worsening drainage.  Neurological: Negative for other syncope or numbness. Hematological: Negative for other adenopathy or swelling Psychiatric/Behavioral: Negative for hallucinations, other worsening agitation, SI, self-injury, or new decreased concentration All other system neg per pt    Objective:   Physical Exam BP 116/68   Pulse 63   Ht 5\' 5"  (1.651 m)   Wt 234 lb (106.1 kg)   SpO2 99%   BMI 38.94 kg/m  VS noted,  Constitutional: Pt is oriented to person, place, and time. Appears well-developed and well-nourished, in no significant distress and comfortable Head: Normocephalic and atraumatic  Eyes: Conjunctivae and EOM are normal. Pupils are equal, round, and reactive to light Right Ear: External ear normal without discharge Left Ear: External ear normal without discharge Nose: Nose without discharge or deformity Mouth/Throat: Oropharynx  is without other ulcerations and moist  Neck: Normal range of motion. Neck supple. No JVD present. No tracheal deviation present or significant neck LA or mass Cardiovascular: Normal rate, regular rhythm, normal heart sounds and intact distal pulses.   Pulmonary/Chest: WOB normal and breath sounds without rales or wheezing  Abdominal: Soft. Bowel sounds are normal. NT. No HSM  Musculoskeletal: Normal range of motion. Exhibits no edema Lymphadenopathy: Has no other cervical adenopathy.  Neurological: Pt is alert and oriented to person, place, and time. Pt has normal reflexes. No cranial nerve deficit. Motor grossly intact, Gait intact Skin: Skin is warm and dry. No rash noted or new ulcerations Psychiatric:  Has normal mood and affect. Behavior is normal without agitation No other exam findings       Assessment & Plan:

## 2016-12-05 NOTE — Assessment & Plan Note (Signed)

## 2016-12-09 ENCOUNTER — Other Ambulatory Visit: Payer: Self-pay

## 2016-12-09 MED ORDER — DICLOFENAC SODIUM 1 % TD GEL: 2.0000 g | TRANSDERMAL | 3 refills | Status: DC | PRN

## 2016-12-10 MED ORDER — DICLOFENAC SODIUM 1 % TD GEL: 2.0000 g | Freq: Four times a day (QID) | TRANSDERMAL | 3 refills | Status: DC | PRN

## 2016-12-10 NOTE — Telephone Encounter (Signed)
Rec'd fax stating clarification is needed for rx Diclofenac sod 1% the frequency is missing. Updated script resent to [facility]...Johny Chess

## 2016-12-10 NOTE — Addendum Note (Signed)
Addended by: Earnstine Regal on: 12/10/2016 10:58 AM   Modules accepted: Orders

## 2017-01-29 ENCOUNTER — Encounter: Payer: Self-pay | Admitting: Cardiovascular Disease

## 2017-01-29 ENCOUNTER — Ambulatory Visit (INDEPENDENT_AMBULATORY_CARE_PROVIDER_SITE_OTHER): Payer: Medicare PPO | Admitting: Cardiovascular Disease

## 2017-01-29 VITALS — BP 130/70 | HR 54 | Ht 65.0 in | Wt 237.0 lb

## 2017-01-29 DIAGNOSIS — E785 Hyperlipidemia, unspecified: Secondary | ICD-10-CM

## 2017-01-29 DIAGNOSIS — E669 Obesity, unspecified: Secondary | ICD-10-CM | POA: Diagnosis not present

## 2017-01-29 DIAGNOSIS — I2510 Atherosclerotic heart disease of native coronary artery without angina pectoris: Secondary | ICD-10-CM | POA: Diagnosis not present

## 2017-01-29 DIAGNOSIS — I2583 Coronary atherosclerosis due to lipid rich plaque: Secondary | ICD-10-CM

## 2017-01-29 DIAGNOSIS — Z951 Presence of aortocoronary bypass graft: Secondary | ICD-10-CM | POA: Diagnosis not present

## 2017-01-29 DIAGNOSIS — I10 Essential (primary) hypertension: Secondary | ICD-10-CM

## 2017-01-29 NOTE — Progress Notes (Signed)
Patient ID: Andres [name], male   DOB: 17-Jan-1941, 76 y.o.   MRN: [id number]   HPI: Andres [name] is a 76 y.o. male who presents to the office today for a 65 month cardiology evaluation.   Mr. Sabala has established coronary as well a cerebral vascular disease. In 1997 he underwent CABG revascularization surgery x6 by Dr. Servando Snare and had a LIMA placed to the LAD, sequential vein to the intermediate and second diagonal, vein to the OM 2, sequential vein to the acute margin and PDA. 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 A nuclear perfusion study in March 2012 was unchanged and showed previously noted inferior scar. An echo Doppler study in May 2013 showed mild asymmetric left ventricular hypertrophy with an ejection fraction of 50-55%. There was mild posterior wall hypokinesis with mild inferior wall hypokinesis. He had moderate mitral regurgitation and mild aortic valve sclerosis with mild tricuspid insufficiency.    In December 2014 he was hospitalized with renal insufficiency, hypotension, and had mildly positive troponin level. I did review these hospital records.  He had a negative pharmacologic stress test. He was found to have small bowel obstruction with right inguinal hernia with herniation of the distal small bowel loop. On July 21 2013 he underwent surgery for this incarcerated right inguinal hernia with small bowel obstruction by Dr. Hulen Skains and tolerated that well from a cardiovascular standpoint.  Since I saw him one year ago, he has remained asymptomatic with reference to his CAD.  He continues to exercise.  He is no longer swimming but exercises 4 days a week and plays golf 2 days per week.  He denies any chest pain.  He is unaware of palpitations.  There is no presyncope or syncope.  He denies bleeding and continues to be on dual antiplatelet therapy.  He continues to be on losartan HCT 50/12.5 and metoprolol 50 g twice a day for hypertension.  He presents for  evaluation  Past Medical History:  Diagnosis Date  . ALLERGIC RHINITIS   . Benign prostatic hypertrophy   . 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 Last nuc 2012 negative. 07/21/2013  . Carpal tunnel syndrome, bilateral   . Cataracts, bilateral   . Coronary artery disease    Dr. Claiborne Billings; 2D ECHO, 12/11/2011 - EF 50-55%, normal; NUCLEAR STRESS TEST, 10/16/2010 - no evidemce of inducible ischemia  . Diabetes mellitus type II   . Diverticulosis of colon   . GERD (gastroesophageal reflux disease)   . H/O hiatal hernia   . Head mass   . Hepatitis   . Hx of   sessile serrated colonic polyp 02/27/2015  . Hyperlipidemia   . Hypertension   . Inguinal hernia, Rt reduced, Lt present 07/21/2013  . Low back pain   . Mitral valve prolapse 11/24/2013   By echo dec 2014  . Nephrolithiasis   . Obesity, morbid ([place])   . Occlusion and stenosis of carotid artery without mention of cerebral infarction    CAROTID DOPPLER, 03/18/2012 - srable, mild, hard, plaque noted, bilaterally  . Stroke [facility])     Past Surgical History:  Procedure Laterality Date  . APPENDECTOMY    . CARDIAC CATHETERIZATION    . CATARACT EXTRACTION Right   . CORONARY ARTERY BYPASS GRAFT    . INGUINAL HERNIA REPAIR Right 07/26/2013   Procedure: REPAIR  INCARCERATED RIGHT INGUINAL  HERNIA ;  Surgeon: Gwenyth Ober, MD;  Location: [place];  Service:  General;  Laterality: Right;    No Known Allergies  Current Outpatient Prescriptions  Medication Sig Dispense Refill  . acetaminophen (TYLENOL) 325 MG tablet Take 325 mg by mouth every 6 (six) hours as needed for mild pain. Reported on 07/21/2015    . Ascorbic Acid (VITAMIN C) 1000 MG tablet Take 1,000 mg by mouth daily.    . Aspirin (ADULT ASPIRIN LOW STRENGTH) 81 MG EC tablet Take 81 mg by mouth daily.      Marland Kitchen atorvastatin (LIPITOR) 40 MG tablet TAKE 1 TABLET EVERY DAY 90 tablet 1  . clopidogrel (PLAVIX) 75 MG tablet TAKE 1 TABLET EVERY DAY 90 tablet 1  . diclofenac sodium  (VOLTAREN) 1 % GEL Apply 2 g topically every 6 (six) hours as needed (for arthritis). 100 g 3  . FEXOFENADINE HCL PO Take 1 tablet by mouth daily.    . folic acid (FOLVITE) 1 MG tablet Take 1 tablet (1 mg total) by mouth daily. 90 tablet 2  . losartan-hydrochlorothiazide (HYZAAR) 50-12.5 MG tablet TAKE 1 TABLET EVERY DAY 90 tablet 1  . metoprolol (LOPRESSOR) 50 MG tablet TAKE 1 TABLET TWICE DAILY 180 tablet 1  . Multiple Vitamin (MULTIVITAMIN) capsule Take 1 capsule by mouth daily.     . Omega-3 Fatty Acids (OMEGA 3 PO) Take 360 mg by mouth 2 (two) times daily.    . tamsulosin (FLOMAX) 0.4 MG CAPS capsule TAKE 1 CAPSULE EVERY DAY 90 capsule 1   No current facility-administered medications for this visit.     Social history is notable that he is widowed and has 2 children one grandchild. He does try to walk her there is no tobacco or alcohol use.  ROS General: Negative; No fevers, chills, or night sweats; positive for obesity HEENT: Negative; No changes in vision or hearing, sinus congestion, difficulty swallowing Pulmonary: Negative; No cough, wheezing, shortness of breath, hemoptysis Cardiovascular: Negative; No chest pain, presyncope, syncope, palpitations GI: Negative; No nausea, vomiting, diarrhea, or abdominal pain GU: Negative; No dysuria, hematuria, or difficulty voiding Musculoskeletal: Negative; no myalgias, joint pain, or weakness Hematologic/Oncology: Negative; no easy bruising, bleeding Endocrine: Positive for metabolic syndrome; no heat/cold intolerance;  Neuro: Negative; no changes in balance, headaches Skin: Negative; No rashes or skin lesions Psychiatric: Negative; No behavioral problems, depression Sleep: Negative; No snoring, daytime sleepiness, hypersomnolence, bruxism, restless legs, hypnogognic hallucinations, no cataplexy Other comprehensive 14 point system review is negative.  PE BP 130/70 (BP Location: Left Arm, Patient Position: Sitting, Cuff Size: Normal)    Pulse (!) 54   Ht 5' 5"  (1.651 m)   Wt 237 lb (107.5 kg)   BMI 39.44 kg/m    Repeat blood pressure by me was 130/72.  Wt Readings from Last 3 Encounters:  01/29/17 237 lb (107.5 kg)  12/03/16 234 lb (106.1 kg)  08/28/16 236 lb (107 kg)   General: Alert, oriented, no distress.  Skin: normal turgor, no rashes, warm and dry HEENT: Normocephalic, atraumatic. Pupils equal round and reactive to light; sclera anicteric; extraocular muscles intact; Nose without nasal septal hypertrophy Mouth/Parynx benign; Mallinpatti scale 3 Neck: No JVD, no carotid bruits; normal carotid upstroke Lungs: clear to ausculatation and percussion; no wheezing or rales Chest wall: without tenderness to palpitation Heart: PMI not displaced, RRR, s1 s2 normal, 1/6 systolic murmur, no diastolic murmur, no rubs, gallops, thrills, or heaves Abdomen: Moderate central adiposity.  soft, nontender; no hepatosplenomehaly, BS+; abdominal aorta nontender and not dilated by palpation. Back: no CVA tenderness Pulses 2+ Musculoskeletal: full  range of motion, normal strength, no joint deformities Extremities: no clubbing cyanosis or edema, Homan's sign negative  Neurologic: grossly nonfocal; Cranial nerves grossly wnl Psychologic: Normal mood and affect; normal cognition.   ECG (independently read by me): Sinus bradycardia 54 bpm with mild sinus arrhythmia.  Nonspecific T-wave abnormality inferolaterally.  QTc interval 409 ms.  Nonspecific interventricular conduction delay  June 2017 ECG (independently read by me): Normal sinus rhythm at 60 bpm.  Nonspecific T changes.  PR interval 176 ms, and QTc interval 416 ms.  September 2016 ECG (independently read by me): Sinus bradycardia 51 bpm.  Nonspecific T changes.  February 2016 ECG: (Independently read by me):  Normal sinus rhythm with sinus arrhythmia.  Nonspecific T changes.  September 2015ECG: (Independently read by me): Normal sinus rhythm with sinus arrhythmia.  QTc  interval 422 ms.  Inferolateral T wave abnormality.  08/15/2013 ECG: (Independently read by me): Normal sinus rhythm with mild sinus arrhythmia. Nonspecific T changes inferolaterally. Early repolarization. Normal intervals.  LABS:  BMP Latest Ref Rng & Units 11/04/2016 05/29/2016 11/22/2015  Glucose 70 - 99 mg/dL 102(H) 98 104(H)  BUN 6 - 23 mg/dL 18 18 17   Creatinine 0.40 - 1.50 mg/dL 1.31 1.35 1.33  Sodium 135 - 145 mEq/L 141 142 140  Potassium 3.5 - 5.1 mEq/L 4.4 4.7 4.6  Chloride 96 - 112 mEq/L 106 104 103  CO2 19 - 32 mEq/L 30 34(H) 32  Calcium 8.4 - 10.5 mg/dL 9.1 9.6 9.6   Hepatic Function Latest Ref Rng & Units 11/04/2016 05/29/2016 11/22/2015  Total Protein 6.0 - 8.3 g/dL 6.0 6.7 6.3  Albumin 3.5 - 5.2 g/dL 4.1 4.2 4.3  AST 0 - 37 U/L 27 30 30   ALT 0 - 53 U/L 18 20 21   Alk Phosphatase 39 - 117 U/L 60 58 60  Total Bilirubin 0.2 - 1.2 mg/dL 0.5 0.6 1.0  Bilirubin, Direct 0.0 - 0.3 mg/dL 0.1 0.1 0.2   CBC Latest Ref Rng & Units 11/04/2016 11/22/2015 03/12/2015  WBC 4.0 - 10.5 K/uL 4.5 4.2 4.7  Hemoglobin 13.0 - 17.0 g/dL 12.5(L) 13.2 13.5  Hematocrit 39.0 - 52.0 % 37.1(L) 39.0 40.0  Platelets 150.0 - 400.0 K/uL 159.0 184.0 176   Lab Results  Component Value Date   MCV 98.6 11/04/2016   MCV 98.3 11/22/2015   MCV 97.1 03/12/2015   Lab Results  Component Value Date   TSH 1.02 11/04/2016   Lab Results  Component Value Date   HGBA1C 6.3 05/29/2016   Lipid Panel     Component Value Date/Time   CHOL 120 11/04/2016 0823   TRIG 82.0 11/04/2016 0823   HDL 34.70 (L) 11/04/2016 0823   CHOLHDL 3 11/04/2016 0823   VLDL 16.4 11/04/2016 0823   LDLCALC 69 11/04/2016 0823   RADIOLOGY: No results found.  IMPRESSION:  1. Coronary artery disease due to lipid rich plaque   2. S/P CABG (coronary artery bypass graft)   3. Obesity (BMI 30-39.9)   4. Essential hypertension   5. Hyperlipidemia with target LDL less than 100     ASSESSMENT AND PLAN: Mr. Jump is a 76 year old  African-American male who is 21 years status post CABG x 6 revascularization surgery performed by Dr. Servando Snare in 1997. His stress test in 2014 done preoperatively prior to undergoing bowel obstruction surgery was unchanged from previously and negative for pharmacological stress-induced ischemia.  His blood pressure today is stable on his regimen consisting of losartan HCT 50/12.5 and metoprolol 50 twice  a day.  He continues to be on dual antiplatelet therapy with aspirin and Plavix.  There is no bleeding.  He's not had any recurrent chest pain and remains asymptomatic with reference to his CAD.  He has hyperlipidemia and is tolerating atorvastatin 40 mg and omega-3 fatty acid.  Lab work from April 2018 was reviewed.  LDL cholesterol was 69.  He continues to exercise regular.  He is borderline for morbid obesity with BMI of 39.44.  Weight loss and continued exercise was recommended.  His last nuclear stress test was in 2014.  In November 2018.  I am recommending a follow-up [place] study since he is over 21 years since bypass surgery.  I will see him in December 2018 for follow-up evaluation. Time spent: 25 minutes Troy Sine, MD, [facility]  01/31/2017 9:49 AM

## 2017-01-29 NOTE — Patient Instructions (Signed)
Your physician wants you to follow-up in: 6 months or sooner if needed. You will receive a reminder letter in the mail two months in advance. If you don't receive a letter, please call our office to schedule the follow-up appointment.  Your physician has requested that you have a lexiscan myoview. For further information please visit [email or web address]. Please follow instruction sheet, as given. This will be done in [place]

## 2017-02-18 ENCOUNTER — Ambulatory Visit (INDEPENDENT_AMBULATORY_CARE_PROVIDER_SITE_OTHER): Payer: Medicare PPO | Admitting: Podiatry

## 2017-02-18 ENCOUNTER — Encounter: Payer: Self-pay | Admitting: Podiatry

## 2017-02-18 DIAGNOSIS — M79676 Pain in unspecified toe(s): Secondary | ICD-10-CM | POA: Diagnosis not present

## 2017-02-18 DIAGNOSIS — B351 Tinea unguium: Secondary | ICD-10-CM | POA: Diagnosis not present

## 2017-02-18 DIAGNOSIS — E119 Type 2 diabetes mellitus without complications: Secondary | ICD-10-CM

## 2017-02-18 NOTE — Progress Notes (Signed)
Patient ID: Andres [name], male   DOB: 01/29/1941, 76 y.o.   MRN: [id number] . Complaint:  Visit Type: Patient returns to my office for continued preventative foot care services. Complaint: Patient states" my nails have grown long and thick and become painful to walk and wear shoes" Patient has been diagnosed with DM with no foot complications. The patient presents for preventative foot care services. No changes to ROS  Podiatric Exam: Vascular: dorsalis pedis and posterior tibial pulses are palpable bilateral. Capillary return is immediate. Temperature gradient is WNL. Skin turgor WNL  Sensorium: Normal Semmes Weinstein monofilament test. Normal tactile sensation bilaterally. Nail Exam: Pt has thick disfigured discolored nails with subungual debris noted bilateral entire nail hallux through fifth toenails Ulcer Exam: There is no evidence of ulcer or pre-ulcerative changes or infection. Orthopedic Exam: Muscle tone and strength are WNL. No limitations in general ROM. No crepitus or effusions noted. Foot type and digits show no abnormalities.  HAV  B/L. Skin: No Porokeratosis. No infection or ulcers  Diagnosis:  Onychomycosis, , Pain in right toe, pain in left toes  Treatment & Plan Procedures and Treatment: Consent by patient was obtained for treatment procedures. The patient understood the discussion of treatment and procedures well. All questions were answered thoroughly reviewed. Debridement of mycotic and hypertrophic toenails, 1 through 5 bilateral and clearing of subungual debris. No ulceration, no infection noted. Patient not interested in diabetic shoes. Return Visit-Office Procedure: Patient instructed to return to the office for a follow up visit 10 weeks for continued evaluation and treatment   Andres [name] DPM 

## 2017-04-23 ENCOUNTER — Other Ambulatory Visit: Payer: Self-pay | Admitting: Internal Medicine

## 2017-04-29 ENCOUNTER — Ambulatory Visit (INDEPENDENT_AMBULATORY_CARE_PROVIDER_SITE_OTHER): Payer: Medicare PPO | Admitting: Podiatry

## 2017-04-29 ENCOUNTER — Encounter: Payer: Self-pay | Admitting: Podiatry

## 2017-04-29 DIAGNOSIS — M79676 Pain in unspecified toe(s): Secondary | ICD-10-CM | POA: Diagnosis not present

## 2017-04-29 DIAGNOSIS — B351 Tinea unguium: Secondary | ICD-10-CM

## 2017-04-29 DIAGNOSIS — E119 Type 2 diabetes mellitus without complications: Secondary | ICD-10-CM

## 2017-04-29 NOTE — Progress Notes (Signed)
Patient ID: Andres [name], male   DOB: 04/19/1941, 76 y.o.   MRN: [id number] . Complaint:  Visit Type: Patient returns to my office for continued preventative foot care services. Complaint: Patient states" my nails have grown long and thick and become painful to walk and wear shoes" Patient has been diagnosed with DM with no foot complications. The patient presents for preventative foot care services. No changes to ROS  Podiatric Exam: Vascular: dorsalis pedis and posterior tibial pulses are palpable bilateral. Capillary return is immediate. Temperature gradient is WNL. Skin turgor WNL  Sensorium: Normal Semmes Weinstein monofilament test. Normal tactile sensation bilaterally. Nail Exam: Pt has thick disfigured discolored nails with subungual debris noted bilateral entire nail hallux through fifth toenails Ulcer Exam: There is no evidence of ulcer or pre-ulcerative changes or infection. Orthopedic Exam: Muscle tone and strength are WNL. No limitations in general ROM. No crepitus or effusions noted. Foot type and digits show no abnormalities.  HAV  B/L. Skin: No Porokeratosis. No infection or ulcers  Diagnosis:  Onychomycosis, , Pain in right toe, pain in left toes  Treatment & Plan Procedures and Treatment: Consent by patient was obtained for treatment procedures. The patient understood the discussion of treatment and procedures well. All questions were answered thoroughly reviewed. Debridement of mycotic and hypertrophic toenails, 1 through 5 bilateral and clearing of subungual debris. No ulceration, no infection noted. Patient not interested in diabetic shoes. Return Visit-Office Procedure: Patient instructed to return to the office for a follow up visit 10 weeks for continued evaluation and treatment   Gardiner Barefoot DPM

## 2017-05-14 ENCOUNTER — Ambulatory Visit (INDEPENDENT_AMBULATORY_CARE_PROVIDER_SITE_OTHER): Payer: Medicare PPO | Admitting: General Practice

## 2017-05-14 DIAGNOSIS — Z23 Encounter for immunization: Secondary | ICD-10-CM

## 2017-06-03 ENCOUNTER — Encounter: Payer: Self-pay | Admitting: Internal Medicine

## 2017-06-03 ENCOUNTER — Other Ambulatory Visit (INDEPENDENT_AMBULATORY_CARE_PROVIDER_SITE_OTHER): Payer: Medicare PPO

## 2017-06-03 ENCOUNTER — Ambulatory Visit (INDEPENDENT_AMBULATORY_CARE_PROVIDER_SITE_OTHER): Payer: Medicare PPO | Admitting: Internal Medicine

## 2017-06-03 VITALS — BP 124/72 | HR 60 | Temp 97.9°F | Ht 65.0 in | Wt 234.0 lb

## 2017-06-03 DIAGNOSIS — Z0000 Encounter for general adult medical examination without abnormal findings: Secondary | ICD-10-CM

## 2017-06-03 DIAGNOSIS — E119 Type 2 diabetes mellitus without complications: Secondary | ICD-10-CM

## 2017-06-03 DIAGNOSIS — N509 Disorder of male genital organs, unspecified: Secondary | ICD-10-CM

## 2017-06-03 DIAGNOSIS — N5089 Other specified disorders of the male genital organs: Secondary | ICD-10-CM

## 2017-06-03 DIAGNOSIS — I10 Essential (primary) hypertension: Secondary | ICD-10-CM

## 2017-06-03 DIAGNOSIS — N3943 Post-void dribbling: Secondary | ICD-10-CM | POA: Insufficient documentation

## 2017-06-03 DIAGNOSIS — E785 Hyperlipidemia, unspecified: Secondary | ICD-10-CM

## 2017-06-03 LAB — LIPID PANEL
Cholesterol: 125 mg/dL (ref 0–200)
HDL: 37.7 mg/dL — AB (ref >39.00)
LDL Cholesterol: 71 mg/dL (ref 0–99)
NONHDL: 87.29
TRIGLYCERIDES: 83 mg/dL (ref 0.0–149.0)
Total CHOL/HDL Ratio: 3
VLDL: 16.6 mg/dL (ref 0.0–40.0)

## 2017-06-03 LAB — HEPATIC FUNCTION PANEL
ALT: 20 U/L (ref 0–53)
AST: 26 U/L (ref 0–37)
Albumin: 4.3 g/dL (ref 3.5–5.2)
Alkaline Phosphatase: 59 U/L (ref 39–117)
BILIRUBIN TOTAL: 1.1 mg/dL (ref 0.2–1.2)
Bilirubin, Direct: 0.2 mg/dL (ref 0.0–0.3)
Total Protein: 6.8 g/dL (ref 6.0–8.3)

## 2017-06-03 LAB — BASIC METABOLIC PANEL
BUN: 25 mg/dL — AB (ref 6–23)
CO2: 32 mEq/L (ref 19–32)
Calcium: 9.7 mg/dL (ref 8.4–10.5)
Chloride: 103 mEq/L (ref 96–112)
Creatinine, Ser: 1.34 mg/dL (ref 0.40–1.50)
GFR: 66.21 mL/min (ref >60.00)
Glucose, Bld: 85 mg/dL (ref 70–99)
POTASSIUM: 4.5 meq/L (ref 3.5–5.1)
SODIUM: 140 meq/L (ref 135–145)

## 2017-06-03 LAB — URINALYSIS, ROUTINE W REFLEX MICROSCOPIC
Bilirubin Urine: NEGATIVE
HGB URINE DIPSTICK: NEGATIVE
KETONES UR: NEGATIVE
Leukocytes, UA: NEGATIVE
NITRITE: NEGATIVE
RBC / HPF: NONE SEEN (ref 0–?)
SPECIFIC GRAVITY, URINE: 1.015 (ref 1.000–1.030)
Total Protein, Urine: NEGATIVE
UROBILINOGEN UA: 0.2 (ref 0.0–1.0)
Urine Glucose: NEGATIVE
pH: 6 (ref 5.0–8.0)

## 2017-06-03 LAB — HEMOGLOBIN A1C: HEMOGLOBIN A1C: 6.2 % (ref 4.6–6.5)

## 2017-06-03 NOTE — Patient Instructions (Signed)
Please continue all other medications as before, and refills have been done if requested.  Please have the pharmacy call with any other refills you may need.  Please continue your efforts at being more active, low cholesterol diet, and weight control.  You are otherwise up to date with prevention measures today.  Please keep your appointments with your specialists as you may have planned  You will be contacted regarding the referral for: Scrotum Ultrasound  Please go to the LAB in the Basement (turn left off the elevator) for the tests to be done today  You will be contacted by phone if any changes need to be made immediately.  Otherwise, you will receive a letter about your results with an explanation, but please check with MyChart first.  Please remember to sign up for MyChart if you have not done so, as this will be important to you in the future with finding out test results, communicating by private email, and scheduling acute appointments online when needed.  If you have Medicare related insurance (such as traditional Medicare, Blue [employer] or [employer], or similar), Please make an appointment at the [facility] with Sharee Pimple, the [employer], for your Wellness Visit in this office, which is a benefit with your insurance.  Please return in 6 months, or sooner if needed, with Lab testing done 3-5 days before

## 2017-06-03 NOTE — Progress Notes (Signed)
Subjective:    Patient ID: Andres [name], male    DOB: 11-Jan-1941, 76 y.o.   MRN: [id number]  HPI Here to f/u; overall doing ok,  Pt denies chest pain, increasing sob or doe, wheezing, orthopnea, PND, increased LE swelling, palpitations, dizziness or syncope.  Pt denies new neurological symptoms such as new headache, or facial or extremity weakness or numbness.  Pt denies polydipsia, polyuria, or low sugar episode.  Pt states overall good compliance with meds, mostly trying to follow appropriate diet, with wt overall stable Wt Readings from Last 3 Encounters:  06/03/17 234 lb (106.1 kg)  01/29/17 237 lb (107.5 kg)  12/03/16 234 lb (106.1 kg)  Still goes to gym 3-4 times per wk, also 2 days plays golf.   Also c/o mild Urinary dribbling small but Denies urinary symptoms such as dysuria, frequency, urgency, flank pain, hematuria or n/v, fever, chills.  Does have ? Enlarging scrotum without pain, wants to be checked.  Past Medical History:  Diagnosis Date  . ALLERGIC RHINITIS   . Benign prostatic hypertrophy   . 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 Last nuc 2012 negative. 07/21/2013  . Carpal tunnel syndrome, bilateral   . Cataracts, bilateral   . Coronary artery disease    Dr. Claiborne Billings; 2D ECHO, 12/11/2011 - EF 50-55%, normal; NUCLEAR STRESS TEST, 10/16/2010 - no evidemce of inducible ischemia  . Diabetes mellitus type II   . Diverticulosis of colon   . GERD (gastroesophageal reflux disease)   . H/O hiatal hernia   . Head mass   . Hepatitis   . Hx of   sessile serrated colonic polyp 02/27/2015  . Hyperlipidemia   . Hypertension   . Inguinal hernia, Rt reduced, Lt present 07/21/2013  . Low back pain   . Mitral valve prolapse 11/24/2013   By echo dec 2014  . Nephrolithiasis   . 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 Occlusion and stenosis of carotid artery without mention of cerebral infarction    CAROTID DOPPLER, 03/18/2012 - srable, mild, hard, plaque noted, bilaterally  . Stroke [facility])     Past Surgical History:  Procedure Laterality Date  . APPENDECTOMY    . CARDIAC CATHETERIZATION    . CATARACT EXTRACTION Right   . CORONARY ARTERY BYPASS GRAFT    . INGUINAL HERNIA REPAIR Right 07/26/2013   Procedure: REPAIR  INCARCERATED RIGHT INGUINAL  HERNIA ;  Surgeon: Gwenyth Ober, MD;  Location: [facility];  Service: General;  Laterality: Right;    reports that he quit smoking about 27 years ago. His smoking use included Cigarettes. He has never used smokeless tobacco. He reports that he does not drink alcohol or use drugs. family history includes Alzheimer's disease in his father; Diabetes in his brother, mother, and sister; Heart attack in his brother, maternal grandmother, and mother; Hypertension in his maternal grandmother and son; Kidney disease in his brother and son; Pancreatic cancer in his brother. No Known Allergies Current Outpatient Prescriptions on File Prior to Visit  Medication Sig Dispense Refill  . acetaminophen (TYLENOL) 325 MG tablet Take 325 mg by mouth every 6 (six) hours as needed for mild pain. Reported on 07/21/2015    . Ascorbic Acid (VITAMIN C) 1000 MG tablet Take 1,000 mg by mouth daily.    . Aspirin (ADULT ASPIRIN LOW STRENGTH) 81 MG EC tablet Take 81 mg by mouth daily.      Marland Kitchen atorvastatin (LIPITOR) 40 MG tablet TAKE 1 TABLET EVERY  DAY 90 tablet 1  . clopidogrel (PLAVIX) 75 MG tablet TAKE 1 TABLET EVERY DAY 90 tablet 1  . diclofenac sodium (VOLTAREN) 1 % GEL Apply 2 g topically every 6 (six) hours as needed (for arthritis). 100 g 3  . FEXOFENADINE HCL PO Take 1 tablet by mouth daily.    . folic acid (FOLVITE) 1 MG tablet Take 1 tablet (1 mg total) by mouth daily. 90 tablet 2  . losartan-hydrochlorothiazide (HYZAAR) 50-12.5 MG tablet TAKE 1 TABLET EVERY DAY 90 tablet 1  . metoprolol tartrate (LOPRESSOR) 50 MG tablet TAKE 1 TABLET TWICE DAILY 180 tablet 1  . Multiple Vitamin (MULTIVITAMIN) capsule Take 1 capsule by mouth daily.     . Omega-3 Fatty Acids  (OMEGA 3 PO) Take 360 mg by mouth 2 (two) times daily.    . tamsulosin (FLOMAX) 0.4 MG CAPS capsule TAKE 1 CAPSULE EVERY DAY 90 capsule 1   No current facility-administered medications on file prior to visit.    Review of Systems  Constitutional: Negative for other unusual diaphoresis or sweats HENT: Negative for ear discharge or swelling Eyes: Negative for other worsening visual disturbances Respiratory: Negative for stridor or other swelling  Gastrointestinal: Negative for worsening distension or other blood Genitourinary: Negative for retention or other urinary change Musculoskeletal: Negative for other MSK pain or swelling Skin: Negative for color change or other new lesions Neurological: Negative for worsening tremors and other numbness  Psychiatric/Behavioral: Negative for worsening agitation or other fatigue All other system neg per pt    Objective:   Physical Exam BP 124/72   Pulse 60   Temp 97.9 F (36.6 C) (Oral)   Ht 5\' 5"  (1.651 m)   Wt 234 lb (106.1 kg)   SpO2 99%   BMI 38.94 kg/m  VS noted,  Constitutional: Pt appears in NAD HENT: Head: NCAT.  Right Ear: External ear normal.  Left Ear: External ear normal.  Eyes: . Pupils are equal, round, and reactive to light. Conjunctivae and EOM are normal Nose: without d/c or deformity Neck: Neck supple. Gross normal ROM Cardiovascular: Normal rate and regular rhythm.   Pulmonary/Chest: Effort normal and breath sounds without rales or wheezing.  Abd:  Soft, NT, ND, + BS, no organomegaly GU: right testicle area mild tender with mild swelling but nontender ? Mass like but soft spongy Neurological: Pt is alert. At baseline orientation, motor grossly intact Skin: Skin is warm. No rashes, other new lesions, no LE edema Psychiatric: Pt behavior is normal without agitation  No other exam findings    Assessment & Plan:

## 2017-06-06 NOTE — Assessment & Plan Note (Signed)
Lab Results  Component Value Date   LDLCALC 71 06/03/2017  stable overall by history and exam, recent data reviewed with pt, and pt to continue medical treatment as before,  to f/u any worsening symptoms or concerns

## 2017-06-06 NOTE — Assessment & Plan Note (Signed)
stable overall by history and exam, recent data reviewed with pt, and pt to continue medical treatment as before,  to f/u any worsening symptoms or concerns BP Readings from Last 3 Encounters:  06/03/17 124/72  01/29/17 130/70  12/03/16 116/68

## 2017-06-06 NOTE — Assessment & Plan Note (Signed)
Lab Results  Component Value Date   HGBA1C 6.2 06/03/2017  stable overall by history and exam, recent data reviewed with pt, and pt to continue medical treatment as before,  to f/u any worsening symptoms or concerns

## 2017-06-06 NOTE — Assessment & Plan Note (Signed)
Mild, for urine studies, declines urology referral

## 2017-06-06 NOTE — Assessment & Plan Note (Signed)
?   Varicocele or hydrocele, for scrotal u/s, consider urology referral  - declines for now

## 2017-06-16 ENCOUNTER — Telehealth (HOSPITAL_COMMUNITY): Payer: Self-pay

## 2017-06-16 NOTE — Telephone Encounter (Signed)
Encounter complete. 

## 2017-06-18 ENCOUNTER — Ambulatory Visit (HOSPITAL_COMMUNITY)
Admission: RE | Admit: 2017-06-18 | Discharge: 2017-06-18 | Disposition: A | Discharge status: Home / Self Care | Payer: Medicare PPO | Source: Ambulatory Visit | Attending: Cardiovascular Disease | Admitting: Cardiovascular Disease

## 2017-06-18 DIAGNOSIS — Z87891 Personal history of nicotine dependence: Secondary | ICD-10-CM | POA: Diagnosis not present

## 2017-06-18 DIAGNOSIS — I10 Essential (primary) hypertension: Secondary | ICD-10-CM | POA: Insufficient documentation

## 2017-06-18 DIAGNOSIS — I083 Combined rheumatic disorders of mitral, aortic and tricuspid valves: Secondary | ICD-10-CM | POA: Diagnosis not present

## 2017-06-18 DIAGNOSIS — E119 Type 2 diabetes mellitus without complications: Secondary | ICD-10-CM | POA: Diagnosis not present

## 2017-06-18 DIAGNOSIS — I2583 Coronary atherosclerosis due to lipid rich plaque: Secondary | ICD-10-CM | POA: Diagnosis not present

## 2017-06-18 DIAGNOSIS — I2510 Atherosclerotic heart disease of native coronary artery without angina pectoris: Secondary | ICD-10-CM | POA: Insufficient documentation

## 2017-06-18 DIAGNOSIS — E669 Obesity, unspecified: Secondary | ICD-10-CM | POA: Insufficient documentation

## 2017-06-18 DIAGNOSIS — Z951 Presence of aortocoronary bypass graft: Secondary | ICD-10-CM | POA: Diagnosis not present

## 2017-06-18 DIAGNOSIS — Z8673 Personal history of transient ischemic attack (TIA), and cerebral infarction without residual deficits: Secondary | ICD-10-CM | POA: Diagnosis not present

## 2017-06-18 DIAGNOSIS — Z6839 Body mass index (BMI) 39.0-39.9, adult: Secondary | ICD-10-CM | POA: Diagnosis not present

## 2017-06-18 LAB — MYOCARDIAL PERFUSION IMAGING
CHL CUP NUCLEAR SSS: 7
LVDIAVOL: 127 mL (ref 62–150)
LVSYSVOL: 85 mL
NUC STRESS TID: 1.14
Peak HR: 63 {beats}/min
Rest HR: 48 {beats}/min
SDS: 1
SRS: 6

## 2017-06-18 MED ORDER — TECHNETIUM TC 99M TETROFOSMIN IV KIT
10.9000 | PACK | Freq: Once | INTRAVENOUS | Status: AC | PRN
  Administered 2017-06-18: 10.9 via INTRAVENOUS
  Filled 2017-06-18: qty 11

## 2017-06-18 MED ORDER — REGADENOSON 0.4 MG/5ML IV SOLN
0.4000 mg | Freq: Once | INTRAVENOUS | Status: AC
  Administered 2017-06-18: 0.4 mg via INTRAVENOUS

## 2017-06-18 MED ORDER — TECHNETIUM TC 99M TETROFOSMIN IV KIT
31.2000 | PACK | Freq: Once | INTRAVENOUS | Status: AC | PRN
  Administered 2017-06-18: 31.2 via INTRAVENOUS
  Filled 2017-06-18: qty 32

## 2017-06-19 ENCOUNTER — Other Ambulatory Visit: Payer: Self-pay | Admitting: *Deleted

## 2017-06-19 DIAGNOSIS — I10 Essential (primary) hypertension: Secondary | ICD-10-CM

## 2017-06-19 DIAGNOSIS — I2510 Atherosclerotic heart disease of native coronary artery without angina pectoris: Secondary | ICD-10-CM

## 2017-06-19 DIAGNOSIS — I2583 Coronary atherosclerosis due to lipid rich plaque: Principal | ICD-10-CM

## 2017-07-01 ENCOUNTER — Ambulatory Visit
Admission: RE | Admit: 2017-07-01 | Discharge: 2017-07-01 | Disposition: A | Discharge status: Home / Self Care | Payer: Medicare PPO | Source: Ambulatory Visit | Attending: Internal Medicine | Admitting: Internal Medicine

## 2017-07-01 ENCOUNTER — Encounter: Payer: Self-pay | Admitting: Internal Medicine

## 2017-07-01 DIAGNOSIS — N5089 Other specified disorders of the male genital organs: Secondary | ICD-10-CM

## 2017-07-02 ENCOUNTER — Encounter: Payer: Self-pay | Admitting: Internal Medicine

## 2017-07-06 ENCOUNTER — Ambulatory Visit (HOSPITAL_COMMUNITY): Payer: Medicare PPO | Attending: Internal Medicine

## 2017-07-06 ENCOUNTER — Other Ambulatory Visit: Payer: Self-pay

## 2017-07-06 DIAGNOSIS — I110 Hypertensive heart disease with heart failure: Secondary | ICD-10-CM | POA: Diagnosis not present

## 2017-07-06 DIAGNOSIS — I2510 Atherosclerotic heart disease of native coronary artery without angina pectoris: Secondary | ICD-10-CM | POA: Insufficient documentation

## 2017-07-06 DIAGNOSIS — I10 Essential (primary) hypertension: Secondary | ICD-10-CM | POA: Diagnosis not present

## 2017-07-06 DIAGNOSIS — I2583 Coronary atherosclerosis due to lipid rich plaque: Secondary | ICD-10-CM | POA: Diagnosis not present

## 2017-07-06 DIAGNOSIS — I313 Pericardial effusion (noninflammatory): Secondary | ICD-10-CM | POA: Insufficient documentation

## 2017-07-08 ENCOUNTER — Ambulatory Visit: Payer: Medicare PPO | Admitting: Podiatry

## 2017-07-08 ENCOUNTER — Encounter: Payer: Self-pay | Admitting: Podiatry

## 2017-07-08 DIAGNOSIS — D689 Coagulation defect, unspecified: Secondary | ICD-10-CM

## 2017-07-08 DIAGNOSIS — B351 Tinea unguium: Secondary | ICD-10-CM

## 2017-07-08 DIAGNOSIS — M2011 Hallux valgus (acquired), right foot: Secondary | ICD-10-CM

## 2017-07-08 DIAGNOSIS — E1142 Type 2 diabetes mellitus with diabetic polyneuropathy: Secondary | ICD-10-CM

## 2017-07-08 DIAGNOSIS — M79676 Pain in unspecified toe(s): Secondary | ICD-10-CM

## 2017-07-08 DIAGNOSIS — M2012 Hallux valgus (acquired), left foot: Secondary | ICD-10-CM

## 2017-07-08 NOTE — Progress Notes (Addendum)
Patient ID: Andres [name], male   DOB: Apr 04, 1941, 76 y.o.   MRN: [id number] . Complaint:  Visit Type: Patient returns to my office for continued preventative foot care services. Complaint: Patient states" my nails have grown long and thick and become painful to walk and wear shoes" Patient has been diagnosed with DM with no foot complications. The patient presents for preventative foot care services. No changes to ROS.  Patient is taking plavix.  Podiatric Exam: Vascular: dorsalis pedis and posterior tibial pulses are palpable bilateral. Capillary return is immediate. Temperature gradient is WNL. Skin turgor WNL  Sensorium: Diminished  Semmes Weinstein monofilament test. Normal tactile sensation bilaterally. Nail Exam: Pt has thick disfigured discolored nails with subungual debris noted bilateral entire nail hallux through fifth toenails Ulcer Exam: There is no evidence of ulcer or pre-ulcerative changes or infection. Orthopedic Exam: Muscle tone and strength are WNL. No limitations in general ROM. No crepitus or effusions noted. Foot type and digits show no abnormalities.  HAV  B/L. Skin: No Porokeratosis. No infection or ulcers  Diagnosis:  Onychomycosis, , Pain in right toe, pain in left toes  Treatment & Plan Procedures and Treatment: Consent by patient was obtained for treatment procedures. The patient understood the discussion of treatment and procedures well. All questions were answered thoroughly reviewed. Debridement of mycotic and hypertrophic toenails, 1 through 5 bilateral and clearing of subungual debris. No ulceration, no infection noted. Patient  interested in diabetic shoes. Return Visit-Office Procedure: Patient instructed to return to the office for a follow up visit 10 weeks for continued evaluation and treatment   Gardiner Barefoot DPM

## 2017-07-21 ENCOUNTER — Ambulatory Visit: Payer: Medicare PPO | Admitting: Cardiovascular Disease

## 2017-07-21 ENCOUNTER — Encounter: Payer: Self-pay | Admitting: Cardiovascular Disease

## 2017-07-21 VITALS — BP 134/78 | HR 55 | Ht 65.0 in | Wt 240.6 lb

## 2017-07-21 DIAGNOSIS — I2510 Atherosclerotic heart disease of native coronary artery without angina pectoris: Secondary | ICD-10-CM | POA: Diagnosis not present

## 2017-07-21 DIAGNOSIS — E785 Hyperlipidemia, unspecified: Secondary | ICD-10-CM

## 2017-07-21 DIAGNOSIS — I10 Essential (primary) hypertension: Secondary | ICD-10-CM

## 2017-07-21 DIAGNOSIS — I2583 Coronary atherosclerosis due to lipid rich plaque: Secondary | ICD-10-CM | POA: Diagnosis not present

## 2017-07-21 DIAGNOSIS — Z951 Presence of aortocoronary bypass graft: Secondary | ICD-10-CM | POA: Diagnosis not present

## 2017-07-21 NOTE — Patient Instructions (Signed)

## 2017-07-21 NOTE — Progress Notes (Signed)
Patient ID: Andres [name], male   DOB: 1941/01/03, 76 y.o.   MRN: [id number]   HPI: Andres [name] is a 76 y.o. male who presents to the office today for a 6 month cardiology evaluation.   Andres [name] has established coronary as well a cerebral vascular disease. In 1997 he underwent CABG revascularization surgery x6 by Dr. Servando Snare and had a LIMA placed to the LAD, sequential vein to the intermediate and second diagonal, vein to the OM 2, sequential vein to the acute margin and PDA. 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 A nuclear perfusion study in March 2012 was unchanged and showed previously noted inferior scar. An echo Doppler study in May 2013 showed mild asymmetric left ventricular hypertrophy with an ejection fraction of 50-55%. There was mild posterior wall hypokinesis with mild inferior wall hypokinesis. He had moderate mitral regurgitation and mild aortic valve sclerosis with mild tricuspid insufficiency.    In December 2014 he was hospitalized with renal insufficiency, hypotension, and had mildly positive troponin level. I did review these hospital records.  He had a negative pharmacologic stress test. He was found to have small bowel obstruction with right inguinal hernia with herniation of the distal small bowel loop. On July 21 2013 he underwent surgery for this incarcerated right inguinal hernia with small bowel obstruction by Dr. Hulen Skains and tolerated that well from a cardiovascular standpoint.  He has remained asymptomatic with reference to his CAD.  He continues to exercise.  He is no longer swimming but exercises 4 days a week and plays golf 2 days per week.  He denies any chest pain.  He is unaware of palpitations.  There is no presyncope or syncope.  He denies bleeding and continues to be on dual antiplatelet therapy.  He continues to be on losartan HCT 50/12.5 and metoprolol 50 mg twice a day for hypertension.    Since I last saw him, he underwent a nuclear  perfusion study on 06/18/2017.  There was diaphragmatic attenuation.  A defect was present in the basal inferior, inferolateral, mid inferolateral location.  On nuclear imaging.  Ejection fraction reportedly was 33%.  For further evaluation of his LV function.  He underwent an echo Doppler study on 07/06/2017.  This confirmed normal LV function with an EF of 55-60% without wall motion abnormality.  There was mild LVH.  Laboratory in October 2018 showed a total cholesterol 125, HDL 37, LDL 71, triglycerides 83.  He denies recent anginal symptoms.  He denies shortness of breath.  He continues to be active.  He presents for reevaluation.  Past Medical History:  Diagnosis Date  . ALLERGIC RHINITIS   . Benign prostatic hypertrophy   . 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 Last nuc 2012 negative. 07/21/2013  . Carpal tunnel syndrome, bilateral   . Cataracts, bilateral   . Coronary artery disease    Dr. Claiborne Billings; 2D ECHO, 12/11/2011 - EF 50-55%, normal; NUCLEAR STRESS TEST, 10/16/2010 - no evidemce of inducible ischemia  . Diabetes mellitus type II   . Diverticulosis of colon   . GERD (gastroesophageal reflux disease)   . H/O hiatal hernia   . Head mass   . Hepatitis   . Hx of   sessile serrated colonic polyp 02/27/2015  . Hyperlipidemia   . Hypertension   . Inguinal hernia, Rt reduced, Lt present 07/21/2013  . Low back pain   . Mitral valve prolapse 11/24/2013   By echo  dec 2014  . Nephrolithiasis   . Obesity, morbid ([place])   . Occlusion and stenosis of carotid artery without mention of cerebral infarction    CAROTID DOPPLER, 03/18/2012 - srable, mild, hard, plaque noted, bilaterally  . Stroke [facility])     Past Surgical History:  Procedure Laterality Date  . APPENDECTOMY    . CARDIAC CATHETERIZATION    . CATARACT EXTRACTION Right   . CORONARY ARTERY BYPASS GRAFT    . INGUINAL HERNIA REPAIR Right 07/26/2013   Procedure: REPAIR  INCARCERATED RIGHT INGUINAL  HERNIA ;  Surgeon: Gwenyth Ober, MD;  Location: [facility];  Service: General;  Laterality: Right;    No Known Allergies  Current Outpatient Medications  Medication Sig Dispense Refill  . acetaminophen (TYLENOL) 325 MG tablet Take 325 mg by mouth every 6 (six) hours as needed for mild pain. Reported on 07/21/2015    . Ascorbic Acid (VITAMIN C) 1000 MG tablet Take 1,000 mg by mouth daily.    . Aspirin (ADULT ASPIRIN LOW STRENGTH) 81 MG EC tablet Take 81 mg by mouth daily.      Marland Kitchen atorvastatin (LIPITOR) 40 MG tablet TAKE 1 TABLET EVERY DAY 90 tablet 1  . clopidogrel (PLAVIX) 75 MG tablet TAKE 1 TABLET EVERY DAY 90 tablet 1  . diclofenac sodium (VOLTAREN) 1 % GEL Apply 2 g topically every 6 (six) hours as needed (for arthritis). 100 g 3  . FEXOFENADINE HCL PO Take 1 tablet by mouth daily.    . folic acid (FOLVITE) 1 MG tablet Take 1 tablet (1 mg total) by mouth daily. 90 tablet 2  . losartan-hydrochlorothiazide (HYZAAR) 50-12.5 MG tablet TAKE 1 TABLET EVERY DAY 90 tablet 1  . metoprolol tartrate (LOPRESSOR) 50 MG tablet TAKE 1 TABLET TWICE DAILY 180 tablet 1  . Multiple Vitamin (MULTIVITAMIN) capsule Take 1 capsule by mouth daily.     . Omega-3 Fatty Acids (OMEGA 3 PO) Take 360 mg by mouth 2 (two) times daily.    . tamsulosin (FLOMAX) 0.4 MG CAPS capsule TAKE 1 CAPSULE EVERY DAY 90 capsule 1   No current facility-administered medications for this visit.     Social history is notable that he is widowed and has 2 children one grandchild. He does try to walk her there is no tobacco or alcohol use.  ROS General: Negative; No fevers, chills, or night sweats; positive for obesity HEENT: Negative; No changes in vision or hearing, sinus congestion, difficulty swallowing Pulmonary: Negative; No cough, wheezing, shortness of breath, hemoptysis Cardiovascular: Negative; No chest pain, presyncope, syncope, palpitations GI: Negative; No nausea, vomiting, diarrhea, or abdominal pain GU: Negative; No dysuria, hematuria, or difficulty  voiding Musculoskeletal: Negative; no myalgias, joint pain, or weakness Hematologic/Oncology: Negative; no easy bruising, bleeding Endocrine: Positive for metabolic syndrome; no heat/cold intolerance;  Neuro: Negative; no changes in balance, headaches Skin: Negative; No rashes or skin lesions Psychiatric: Negative; No behavioral problems, depression Sleep: Negative; No snoring, daytime sleepiness, hypersomnolence, bruxism, restless legs, hypnogognic hallucinations, no cataplexy Other comprehensive 14 point system review is negative.  PE BP 134/78   Pulse (!) 55   Ht _0  (1.651 m)   Wt 240 lb 9.6 oz (109.1 kg)   BMI 40.04 kg/m    Repeat blood pressure was by me was 122/74  Wt Readings from Last 3 Encounters:  07/21/17 240 lb 9.6 oz (109.1 kg)  06/18/17 237 lb (107.5 kg)  06/03/17 234 lb (106.1 kg)   General: Alert, oriented, no distress.  Skin: normal turgor, no rashes, warm and dry HEENT: Normocephalic, atraumatic. Pupils equal round and reactive to light; sclera anicteric; extraocular muscles intact;  Nose without nasal septal hypertrophy Mouth/Parynx benign; Mallinpatti scale 3 Neck: No JVD, no carotid bruits; normal carotid upstroke Lungs: clear to ausculatation and percussion; no wheezing or rales Chest wall: without tenderness to palpitation Heart: PMI not displaced, RRR, s1 s2 normal, 1/6 systolic murmur, no diastolic murmur, no rubs, gallops, thrills, or heaves Abdomen: Moderate central adiposity soft, nontender; no hepatosplenomehaly, BS+; abdominal aorta nontender and not dilated by palpation. Back: no CVA tenderness Pulses 2+ Musculoskeletal: full range of motion, normal strength, no joint deformities Extremities: no clubbing cyanosis or edema, Homan's sign negative  Neurologic: grossly nonfocal; Cranial nerves grossly wnl Psychologic: Normal mood and affect    ECG (independently read by me): Sinus bradycardia at 55 bpm with mild sinus arrhythmia.  Nonspecific  ST-T changes inferolaterally.  June 2018 ECG (independently read by me): Sinus bradycardia 54 bpm with mild sinus arrhythmia.  Nonspecific T-wave abnormality inferolaterally.  QTc interval 409 ms.  Nonspecific interventricular conduction delay  June 2017 ECG (independently read by me): Normal sinus rhythm at 60 bpm.  Nonspecific T changes.  PR interval 176 ms, and QTc interval 416 ms.  September 2016 ECG (independently read by me): Sinus bradycardia 51 bpm.  Nonspecific T changes.  February 2016 ECG: (Independently read by me):  Normal sinus rhythm with sinus arrhythmia.  Nonspecific T changes.  September 2015ECG: (Independently read by me): Normal sinus rhythm with sinus arrhythmia.  QTc interval 422 ms.  Inferolateral T wave abnormality.  08/15/2013 ECG: (Independently read by me): Normal sinus rhythm with mild sinus arrhythmia. Nonspecific T changes inferolaterally. Early repolarization. Normal intervals.  LABS:  BMP Latest Ref Rng & Units 06/03/2017 11/04/2016 05/29/2016  Glucose 70 - 99 mg/dL 85 102(H) 98  BUN 6 - 23 mg/dL 25(H) 18 18  Creatinine 0.40 - 1.50 mg/dL 1.34 1.31 1.35  Sodium 135 - 145 mEq/L 140 141 142  Potassium 3.5 - 5.1 mEq/L 4.5 4.4 4.7  Chloride 96 - 112 mEq/L 103 106 104  CO2 19 - 32 mEq/L 32 30 34(H)  Calcium 8.4 - 10.5 mg/dL 9.7 9.1 9.6   Hepatic Function Latest Ref Rng & Units 06/03/2017 11/04/2016 05/29/2016  Total Protein 6.0 - 8.3 g/dL 6.8 6.0 6.7  Albumin 3.5 - 5.2 g/dL 4.3 4.1 4.2  AST 0 - 37 U/L _0 ALT 0 - 53 U/L _1 Alk Phosphatase 39 - 117 U/L 59 60 58  Total Bilirubin 0.2 - 1.2 mg/dL 1.1 0.5 0.6  Bilirubin, Direct 0.0 - 0.3 mg/dL 0.2 0.1 0.1   CBC Latest Ref Rng & Units 11/04/2016 11/22/2015 03/12/2015  WBC 4.0 - 10.5 K/uL 4.5 4.2 4.7  Hemoglobin 13.0 - 17.0 g/dL 12.5(L) 13.2 13.5  Hematocrit 39.0 - 52.0 % 37.1(L) 39.0 40.0  Platelets 150.0 - 400.0 K/uL 159.0 184.0 176   Lab Results  Component Value Date   MCV 98.6 11/04/2016   MCV  98.3 11/22/2015   MCV 97.1 03/12/2015   Lab Results  Component Value Date   TSH 1.02 11/04/2016   Lab Results  Component Value Date   HGBA1C 6.2 06/03/2017   Lipid Panel     Component Value Date/Time   CHOL 125 06/03/2017 1103   TRIG 83.0 06/03/2017 1103   HDL 37.70 (L) 06/03/2017 1103   CHOLHDL 3 06/03/2017 1103   VLDL 16.6 06/03/2017 1103  [place] 71 06/03/2017 1103   RADIOLOGY: No results found.  IMPRESSION:  1. Coronary artery disease due to lipid rich plaque   2. S/P CABG (coronary artery bypass graft)   3. Essential hypertension   4. Hyperlipidemia with target LDL less than 100   5. Morbid obesity ([place])     ASSESSMENT AND PLAN: Mr. Woolstenhulme is a 76 year old African-American male who is status post CABG x 6 revascularization surgery performed by Dr. Servando Snare in 1997. His stress test in 2014 done preoperatively prior to undergoing bowel obstruction surgery was unchanged from previously and negative for pharmacological stress-induced ischemia.  His most recent stress test demonstrated a defect in the basal and mid inferolateral wall.  I suspect most likely is contributed by diaphragmatic attenuation.  However, on his nuclear study.  Ejection fraction was reported low at 33%.  This was not confirmed on a repeat echo evaluation which continue to show an EF of 55-60% and without wall motion abnormality.  He has LVH.  His blood pressure today is stable on his medical regimen consisting of losartan HCT 50/12.5, and metoprolol 50 mg twice a day.  He continues to be on aspirin and Plavix for dual antiplatelet inhibition.  His lipid studies are excellent on atorvastatin 40 mg daily.  He continues to swim and play golf.  We discussed weight loss with his morbid obesity with a BMI of 40.04.  As long as he remains stable, I will see him in 6 months for reevaluation.    Time spent: 25 minutes Troy Sine, MD, [facility]  07/23/2017 9:10 PM

## 2017-07-23 ENCOUNTER — Encounter: Payer: Self-pay | Admitting: Cardiovascular Disease

## 2017-09-07 ENCOUNTER — Other Ambulatory Visit: Payer: Self-pay | Admitting: Internal Medicine

## 2017-09-23 ENCOUNTER — Ambulatory Visit: Payer: Medicare HMO | Admitting: Podiatry

## 2017-09-23 ENCOUNTER — Encounter: Payer: Self-pay | Admitting: Podiatry

## 2017-09-23 DIAGNOSIS — E1142 Type 2 diabetes mellitus with diabetic polyneuropathy: Secondary | ICD-10-CM | POA: Diagnosis not present

## 2017-09-23 DIAGNOSIS — D689 Coagulation defect, unspecified: Secondary | ICD-10-CM

## 2017-09-23 DIAGNOSIS — B351 Tinea unguium: Secondary | ICD-10-CM | POA: Diagnosis not present

## 2017-09-23 DIAGNOSIS — M79676 Pain in unspecified toe(s): Secondary | ICD-10-CM | POA: Diagnosis not present

## 2017-09-23 DIAGNOSIS — M2011 Hallux valgus (acquired), right foot: Secondary | ICD-10-CM

## 2017-09-23 NOTE — Progress Notes (Signed)
Patient ID: LYRICK LAGRAND, male   DOB: [birth year], 77 y.o.   MRN: [id number] . Complaint:  Visit Type: Patient returns to my office for continued preventative foot care services. Complaint: Patient states" my nails have grown long and thick and become painful to walk and wear shoes" Patient has been diagnosed with DM with no foot complications. The patient presents for preventative foot care services. No changes to ROS.  Patient is taking plavix.  Podiatric Exam: Vascular: dorsalis pedis and posterior tibial pulses are palpable bilateral. Capillary return is immediate. Temperature gradient is WNL. Skin turgor WNL  Sensorium: Diminished  Semmes Weinstein monofilament test. Normal tactile sensation bilaterally. Nail Exam: Pt has thick disfigured discolored nails with subungual debris noted bilateral entire nail hallux through fifth toenails Ulcer Exam: There is no evidence of ulcer or pre-ulcerative changes or infection. Orthopedic Exam: Muscle tone and strength are WNL. No limitations in general ROM. No crepitus or effusions noted. Foot type and digits show no abnormalities.  HAV  B/L. Skin: No Porokeratosis. No infection or ulcers  Diagnosis:  Onychomycosis, , Pain in right toe, pain in left toes  Treatment & Plan Procedures and Treatment: Consent by patient was obtained for treatment procedures. The patient understood the discussion of treatment and procedures well. All questions were answered thoroughly reviewed. Debridement of mycotic and hypertrophic toenails, 1 through 5 bilateral and clearing of subungual debris. No ulceration, no infection noted. Discussed diabetic shoes with patient. Return Visit-Office Procedure: Patient instructed to return to the office for a follow up visit 10 weeks for continued evaluation and treatment   Gardiner Barefoot DPM

## 2017-12-02 ENCOUNTER — Encounter: Payer: Self-pay | Admitting: Internal Medicine

## 2017-12-02 ENCOUNTER — Other Ambulatory Visit (INDEPENDENT_AMBULATORY_CARE_PROVIDER_SITE_OTHER): Payer: Medicare HMO

## 2017-12-02 ENCOUNTER — Ambulatory Visit (INDEPENDENT_AMBULATORY_CARE_PROVIDER_SITE_OTHER): Payer: Medicare HMO | Admitting: Internal Medicine

## 2017-12-02 VITALS — BP 116/84 | HR 68 | Temp 98.7°F | Ht 65.0 in | Wt 237.0 lb

## 2017-12-02 DIAGNOSIS — Z0000 Encounter for general adult medical examination without abnormal findings: Secondary | ICD-10-CM | POA: Diagnosis not present

## 2017-12-02 DIAGNOSIS — E119 Type 2 diabetes mellitus without complications: Secondary | ICD-10-CM

## 2017-12-02 LAB — CBC WITH DIFFERENTIAL/PLATELET
Basophils Absolute: 0 10*3/uL (ref 0.0–0.1)
Basophils Relative: 0.6 % (ref 0.0–3.0)
EOS ABS: 0.1 10*3/uL (ref 0.0–0.7)
EOS PCT: 2.2 % (ref 0.0–5.0)
HCT: 38.7 % — ABNORMAL LOW (ref 39.0–52.0)
HEMOGLOBIN: 13 g/dL (ref 13.0–17.0)
Lymphocytes Relative: 46 % (ref 12.0–46.0)
Lymphs Abs: 1.9 10*3/uL (ref 0.7–4.0)
MCHC: 33.7 g/dL (ref 30.0–36.0)
MCV: 100.1 fl — ABNORMAL HIGH (ref 78.0–100.0)
MONO ABS: 0.4 10*3/uL (ref 0.1–1.0)
Monocytes Relative: 8.6 % (ref 3.0–12.0)
Neutro Abs: 1.8 10*3/uL (ref 1.4–7.7)
Neutrophils Relative %: 42.6 % — ABNORMAL LOW (ref 43.0–77.0)
Platelets: 160 10*3/uL (ref 150.0–400.0)
RBC: 3.86 Mil/uL — AB (ref 4.22–5.81)
RDW: 14 % (ref 11.5–15.5)
WBC: 4.2 10*3/uL (ref 4.0–10.5)

## 2017-12-02 LAB — TSH: TSH: 0.75 u[IU]/mL (ref 0.35–4.50)

## 2017-12-02 LAB — BASIC METABOLIC PANEL
BUN: 17 mg/dL (ref 6–23)
CO2: 32 mEq/L (ref 19–32)
Calcium: 9.4 mg/dL (ref 8.4–10.5)
Chloride: 103 mEq/L (ref 96–112)
Creatinine, Ser: 1.32 mg/dL (ref 0.40–1.50)
GFR: 67.28 mL/min (ref >60.00)
GLUCOSE: 89 mg/dL (ref 70–99)
Potassium: 4.3 mEq/L (ref 3.5–5.1)
Sodium: 141 mEq/L (ref 135–145)

## 2017-12-02 LAB — MICROALBUMIN / CREATININE URINE RATIO
Creatinine,U: 94 mg/dL
Microalb Creat Ratio: 0.8 mg/g (ref 0.0–30.0)
Microalb, Ur: 0.7 mg/dL (ref 0.0–1.9)

## 2017-12-02 LAB — URINALYSIS, ROUTINE W REFLEX MICROSCOPIC
Bilirubin Urine: NEGATIVE
Hgb urine dipstick: NEGATIVE
KETONES UR: NEGATIVE
Leukocytes, UA: NEGATIVE
NITRITE: NEGATIVE
SPECIFIC GRAVITY, URINE: 1.015 (ref 1.000–1.030)
Total Protein, Urine: NEGATIVE
Urine Glucose: NEGATIVE
Urobilinogen, UA: 0.2 (ref 0.0–1.0)
pH: 7 (ref 5.0–8.0)

## 2017-12-02 LAB — HEMOGLOBIN A1C: Hgb A1c MFr Bld: 6.4 % (ref 4.6–6.5)

## 2017-12-02 LAB — HEPATIC FUNCTION PANEL
ALK PHOS: 57 U/L (ref 39–117)
ALT: 17 U/L (ref 0–53)
AST: 25 U/L (ref 0–37)
Albumin: 4.2 g/dL (ref 3.5–5.2)
BILIRUBIN DIRECT: 0.2 mg/dL (ref 0.0–0.3)
BILIRUBIN TOTAL: 1.1 mg/dL (ref 0.2–1.2)
Total Protein: 6.3 g/dL (ref 6.0–8.3)

## 2017-12-02 LAB — PSA: PSA: 0.63 ng/mL (ref 0.10–4.00)

## 2017-12-02 LAB — LIPID PANEL
CHOL/HDL RATIO: 3
Cholesterol: 126 mg/dL (ref 0–200)
HDL: 38.5 mg/dL — AB (ref >39.00)
LDL Cholesterol: 68 mg/dL (ref 0–99)
NONHDL: 87.51
Triglycerides: 97 mg/dL (ref 0.0–149.0)
VLDL: 19.4 mg/dL (ref 0.0–40.0)

## 2017-12-02 MED ORDER — ATORVASTATIN CALCIUM 40 MG PO TABS: 40.0000 mg | ORAL_TABLET | Freq: Every day | ORAL | 3 refills | Status: DC

## 2017-12-02 MED ORDER — FOLIC ACID 1 MG PO TABS: 1.0000 mg | ORAL_TABLET | Freq: Every day | ORAL | 3 refills | Status: DC

## 2017-12-02 MED ORDER — TAMSULOSIN HCL 0.4 MG PO CAPS: 0.4000 mg | ORAL_CAPSULE | Freq: Every day | ORAL | 3 refills | Status: DC

## 2017-12-02 MED ORDER — METOPROLOL TARTRATE 50 MG PO TABS: 50.0000 mg | ORAL_TABLET | Freq: Two times a day (BID) | ORAL | 3 refills | Status: DC

## 2017-12-02 MED ORDER — LOSARTAN POTASSIUM-HCTZ 50-12.5 MG PO TABS: 1.0000 | ORAL_TABLET | Freq: Every day | ORAL | 3 refills | Status: DC

## 2017-12-02 MED ORDER — DICLOFENAC SODIUM 1 % TD GEL: 2.0000 g | Freq: Four times a day (QID) | TRANSDERMAL | 3 refills | Status: DC | PRN

## 2017-12-02 MED ORDER — CLOPIDOGREL BISULFATE 75 MG PO TABS: 75.0000 mg | ORAL_TABLET | Freq: Every day | ORAL | 3 refills | Status: DC

## 2017-12-02 NOTE — Assessment & Plan Note (Signed)

## 2017-12-02 NOTE — Assessment & Plan Note (Signed)
stable overall by history and exam, recent data reviewed with pt, and pt to continue medical treatment as before,  to f/u any worsening symptoms or concerns Lab Results  Component Value Date   HGBA1C 6.2 06/03/2017

## 2017-12-02 NOTE — Progress Notes (Signed)
Subjective:    Patient ID: Andres [name], male    DOB: Apr 26, 1941, 77 y.o.   MRN: [id number]  HPI  Here for wellness and f/u;  Overall doing ok;  Pt denies Chest pain, worsening SOB, DOE, wheezing, orthopnea, PND, worsening LE edema, palpitations, dizziness or syncope.  Pt denies neurological change such as new headache, facial or extremity weakness.  Pt denies polydipsia, polyuria, or low sugar symptoms. Pt states overall good compliance with treatment and medications, good tolerability, and has been trying to follow appropriate diet.  Pt denies worsening depressive symptoms, suicidal ideation or panic. No fever, night sweats, wt loss, loss of appetite, or other constitutional symptoms.  Pt states good ability with ADL's, has low fall risk, home safety reviewed and adequate, no other significant changes in hearing or vision, and only occasionally active with exercise.  Goes to gym 3 times per wk, and golf twice per wk.  Does have several wks ongoing nasal allergy symptoms with clearish congestion, itch and sneezing and clear prod cough, without fever, pain, ST, swelling or wheezing.  No other new complaint or interval change Pt states memory not worsening in the past year Past Medical History:  Diagnosis Date  . ALLERGIC RHINITIS   . Benign prostatic hypertrophy   . 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 Last nuc 2012 negative. 07/21/2013  . Carpal tunnel syndrome, bilateral   . Cataracts, bilateral   . Coronary artery disease    Dr. Claiborne Billings; 2D ECHO, 12/11/2011 - EF 50-55%, normal; NUCLEAR STRESS TEST, 10/16/2010 - no evidemce of inducible ischemia  . Diabetes mellitus type II   . Diverticulosis of colon   . GERD (gastroesophageal reflux disease)   . H/O hiatal hernia   . Head mass   . Hepatitis   . Hx of   sessile serrated colonic polyp 02/27/2015  . Hyperlipidemia   . Hypertension   . Inguinal hernia, Rt reduced, Lt present 07/21/2013  . Low back pain   . Mitral valve prolapse  11/24/2013   By echo dec 2014  . Nephrolithiasis   . Obesity, morbid ([place])   . Occlusion and stenosis of carotid artery without mention of cerebral infarction    CAROTID DOPPLER, 03/18/2012 - srable, mild, hard, plaque noted, bilaterally  . Stroke [facility])    Past Surgical History:  Procedure Laterality Date  . APPENDECTOMY    . CARDIAC CATHETERIZATION    . CATARACT EXTRACTION Right   . CORONARY ARTERY BYPASS GRAFT    . INGUINAL HERNIA REPAIR Right 07/26/2013   Procedure: REPAIR  INCARCERATED RIGHT INGUINAL  HERNIA ;  Surgeon: Gwenyth Ober, MD;  Location: [place];  Service: General;  Laterality: Right;    reports that he quit smoking about 27 years ago. His smoking use included cigarettes. He has never used smokeless tobacco. He reports that he does not drink alcohol or use drugs. family history includes Alzheimer's disease in his father; Diabetes in his brother, mother, and sister; Heart attack in his brother, maternal grandmother, and mother; Hypertension in his maternal grandmother and son; Kidney disease in his brother and son; Pancreatic cancer in his brother. No Known Allergies Current Outpatient Medications on File Prior to Visit  Medication Sig Dispense Refill  . acetaminophen (TYLENOL) 325 MG tablet Take 325 mg by mouth every 6 (six) hours as needed for mild pain. Reported on 07/21/2015    . Ascorbic Acid (VITAMIN C) 1000 MG tablet Take 1,000 mg by mouth daily.    Marland Kitchen  Aspirin (ADULT ASPIRIN LOW STRENGTH) 81 MG EC tablet Take 81 mg by mouth daily.      Marland Kitchen FEXOFENADINE HCL PO Take 1 tablet by mouth daily.    . Multiple Vitamin (MULTIVITAMIN) capsule Take 1 capsule by mouth daily.     . Omega-3 Fatty Acids (OMEGA 3 PO) Take 360 mg by mouth 2 (two) times daily.     No current facility-administered medications on file prior to visit.    Review of Systems Constitutional: Negative for other unusual diaphoresis, sweats, appetite or weight changes HENT: Negative for other worsening hearing  loss, ear pain, facial swelling, mouth sores or neck stiffness.   Eyes: Negative for other worsening pain, redness or other visual disturbance.  Respiratory: Negative for other stridor or swelling Cardiovascular: Negative for other palpitations or other chest pain  Gastrointestinal: Negative for worsening diarrhea or loose stools, blood in stool, distention or other pain Genitourinary: Negative for hematuria, flank pain or other change in urine volume.  Musculoskeletal: Negative for myalgias or other joint swelling.  Skin: Negative for other color change, or other wound or worsening drainage.  Neurological: Negative for other syncope or numbness. Hematological: Negative for other adenopathy or swelling Psychiatric/Behavioral: Negative for hallucinations, other worsening agitation, SI, self-injury, or new decreased concentration All other system neg per pt    Objective:   Physical Exam BP 116/84   Pulse 68   Temp 98.7 F (37.1 C) (Oral)   Ht 5\' 5"  (1.651 m)   Wt 237 lb (107.5 kg)   SpO2 98%   BMI 39.44 kg/m  VS noted, not ill appearing Constitutional: Pt is oriented to person, place, and time. Appears well-developed and well-nourished, in no significant distress and comfortable Head: Normocephalic and atraumatic  Eyes: Conjunctivae and EOM are normal. Pupils are equal, round, and reactive to light Right Ear: External ear normal without discharge Left Ear: External ear normal without discharge Nose: Nose without discharge or deformity Mouth/Throat: Oropharynx is without other ulcerations and moist  Neck: Normal range of motion. Neck supple. No JVD present. No tracheal deviation present or significant neck LA or mass Cardiovascular: Normal rate, regular rhythm, normal heart sounds and intact distal pulses.   Pulmonary/Chest: WOB normal and breath sounds without rales or wheezing  Abdominal: Soft. Bowel sounds are normal. NT. No HSM  Musculoskeletal: Normal range of motion. Exhibits  no edema Lymphadenopathy: Has no other cervical adenopathy.  Neurological: Pt is alert and oriented to person, place, and time. Pt has normal reflexes. No cranial nerve deficit. Motor grossly intact, Gait intact Skin: Skin is warm and dry. No rash noted or new ulcerations Psychiatric:  Has normal mood and affect. Behavior is normal without agitation No other exam findings Lab Results  Component Value Date   WBC 4.5 11/04/2016   HGB 12.5 (L) 11/04/2016   HCT 37.1 (L) 11/04/2016   PLT 159.0 11/04/2016   GLUCOSE 85 06/03/2017   CHOL 125 06/03/2017   TRIG 83.0 06/03/2017   HDL 37.70 (L) 06/03/2017   LDLCALC 71 06/03/2017   ALT 20 06/03/2017   AST 26 06/03/2017   NA 140 06/03/2017   K 4.5 06/03/2017   CL 103 06/03/2017   CREATININE 1.34 06/03/2017   BUN 25 (H) 06/03/2017   CO2 32 06/03/2017   TSH 1.02 11/04/2016   PSA 0.57 11/04/2016   INR 1.08 07/21/2013   HGBA1C 6.2 06/03/2017   MICROALBUR 3.0 (H) 11/22/2015       Assessment & Plan:

## 2017-12-02 NOTE — Patient Instructions (Signed)

## 2017-12-09 ENCOUNTER — Ambulatory Visit: Payer: Medicare HMO | Admitting: Podiatry

## 2017-12-09 ENCOUNTER — Encounter: Payer: Self-pay | Admitting: Podiatry

## 2017-12-09 DIAGNOSIS — M79676 Pain in unspecified toe(s): Secondary | ICD-10-CM

## 2017-12-09 DIAGNOSIS — B351 Tinea unguium: Secondary | ICD-10-CM

## 2017-12-09 DIAGNOSIS — E1142 Type 2 diabetes mellitus with diabetic polyneuropathy: Secondary | ICD-10-CM | POA: Diagnosis not present

## 2017-12-09 DIAGNOSIS — D689 Coagulation defect, unspecified: Secondary | ICD-10-CM

## 2017-12-09 DIAGNOSIS — M2011 Hallux valgus (acquired), right foot: Secondary | ICD-10-CM

## 2017-12-09 NOTE — Progress Notes (Signed)
Patient ID: Andres [name], male   DOB: [birth year], 77 y.o.   MRN: [id number] . Complaint:  Visit Type: Patient returns to my office for continued preventative foot care services. Complaint: Patient states" my nails have grown long and thick and become painful to walk and wear shoes" Patient has been diagnosed with DM with no foot complications. The patient presents for preventative foot care services. No changes to ROS.  Patient is taking plavix.  Podiatric Exam: Vascular: dorsalis pedis and posterior tibial pulses are palpable bilateral. Capillary return is immediate. Temperature gradient is WNL. Skin turgor WNL  Sensorium: Diminished  Semmes Weinstein monofilament test. Normal tactile sensation bilaterally. Nail Exam: Pt has thick disfigured discolored nails with subungual debris noted bilateral entire nail hallux through fifth toenails Ulcer Exam: There is no evidence of ulcer or pre-ulcerative changes or infection. Orthopedic Exam: Muscle tone and strength are WNL. No limitations in general ROM. No crepitus or effusions noted. Foot type and digits show no abnormalities.  HAV  B/L. Skin: No Porokeratosis. No infection or ulcers  Diagnosis:  Onychomycosis, , Pain in right toe, pain in left toes  Treatment & Plan Procedures and Treatment: Consent by patient was obtained for treatment procedures. The patient understood the discussion of treatment and procedures well. All questions were answered thoroughly reviewed. Debridement of mycotic and hypertrophic toenails, 1 through 5 bilateral and clearing of subungual debris. No ulceration, no infection noted. Discussed diabetic shoes with patient. Return Visit-Office Procedure: Patient instructed to return to the office for a follow up visit 10 weeks for continued evaluation and treatment   Gardiner Barefoot DPM

## 2017-12-14 ENCOUNTER — Telehealth: Payer: Self-pay | Admitting: Internal Medicine

## 2017-12-14 NOTE — Telephone Encounter (Signed)
Submitted

## 2017-12-14 NOTE — Telephone Encounter (Signed)
Copied from [place] [id number]. Topic: Quick Communication - See Telephone Encounter >> Dec 14, 2017  1:04 PM Cleaster Corin, NT wrote: CRM for notification. See Telephone encounter for: 12/14/17.  Pt. Needing prior auth. On med.diclofenac sodium (VOLTAREN) 1 % GEL [[id number]]    [place] [place] [address] [place] [address] Phone: [phone] Fax: [phone]

## 2017-12-15 ENCOUNTER — Telehealth: Payer: Self-pay

## 2017-12-15 NOTE — Telephone Encounter (Signed)
Approved 08/02/17-08/03/18

## 2018-02-01 DIAGNOSIS — Z8249 Family history of ischemic heart disease and other diseases of the circulatory system: Secondary | ICD-10-CM | POA: Diagnosis not present

## 2018-02-01 DIAGNOSIS — I252 Old myocardial infarction: Secondary | ICD-10-CM | POA: Diagnosis not present

## 2018-02-01 DIAGNOSIS — N400 Enlarged prostate without lower urinary tract symptoms: Secondary | ICD-10-CM | POA: Diagnosis not present

## 2018-02-01 DIAGNOSIS — Z7902 Long term (current) use of antithrombotics/antiplatelets: Secondary | ICD-10-CM | POA: Diagnosis not present

## 2018-02-01 DIAGNOSIS — I10 Essential (primary) hypertension: Secondary | ICD-10-CM | POA: Diagnosis not present

## 2018-02-01 DIAGNOSIS — E785 Hyperlipidemia, unspecified: Secondary | ICD-10-CM | POA: Diagnosis not present

## 2018-02-01 DIAGNOSIS — R32 Unspecified urinary incontinence: Secondary | ICD-10-CM | POA: Diagnosis not present

## 2018-02-01 DIAGNOSIS — Z6839 Body mass index (BMI) 39.0-39.9, adult: Secondary | ICD-10-CM | POA: Diagnosis not present

## 2018-02-01 DIAGNOSIS — E669 Obesity, unspecified: Secondary | ICD-10-CM | POA: Diagnosis not present

## 2018-02-01 DIAGNOSIS — I2510 Atherosclerotic heart disease of native coronary artery without angina pectoris: Secondary | ICD-10-CM | POA: Diagnosis not present

## 2018-02-22 ENCOUNTER — Encounter: Payer: Self-pay | Admitting: Cardiovascular Disease

## 2018-02-22 ENCOUNTER — Ambulatory Visit (INDEPENDENT_AMBULATORY_CARE_PROVIDER_SITE_OTHER): Payer: Medicare HMO | Admitting: Cardiovascular Disease

## 2018-02-22 VITALS — BP 100/62 | HR 59 | Ht 65.0 in

## 2018-02-22 DIAGNOSIS — I10 Essential (primary) hypertension: Secondary | ICD-10-CM

## 2018-02-22 DIAGNOSIS — Z951 Presence of aortocoronary bypass graft: Secondary | ICD-10-CM | POA: Diagnosis not present

## 2018-02-22 DIAGNOSIS — E785 Hyperlipidemia, unspecified: Secondary | ICD-10-CM

## 2018-02-22 DIAGNOSIS — I2510 Atherosclerotic heart disease of native coronary artery without angina pectoris: Secondary | ICD-10-CM

## 2018-02-22 DIAGNOSIS — Z6839 Body mass index (BMI) 39.0-39.9, adult: Secondary | ICD-10-CM | POA: Diagnosis not present

## 2018-02-22 NOTE — Patient Instructions (Signed)

## 2018-02-22 NOTE — Progress Notes (Signed)
Patient ID: PRUDENCIO VELAZCO, male   DOB: 01-Jun-1941, 77 y.o.   MRN: [id number]   HPI: JERRET MCBANE is a 77 y.o. male who presents to the office today for a 8 month cardiology evaluation.   Mr. Moodie has established coronary as well a cerebral vascular disease. In 1997 he underwent CABG revascularization surgery x6 by Dr. Servando Snare and had a LIMA placed to the LAD, sequential vein to the intermediate and second diagonal, vein to the OM 2, sequential vein to the acute margin and PDA. 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 A nuclear perfusion study in March 2012 was unchanged and showed previously noted inferior scar. An echo Doppler study in May 2013 showed mild asymmetric left ventricular hypertrophy with an ejection fraction of 50-55%. There was mild posterior wall hypokinesis with mild inferior wall hypokinesis. He had moderate mitral regurgitation and mild aortic valve sclerosis with mild tricuspid insufficiency.    In December 2014 he was hospitalized with renal insufficiency, hypotension, and had mildly positive troponin level. I did review these hospital records.  He had a negative pharmacologic stress test. He was found to have small bowel obstruction with right inguinal hernia with herniation of the distal small bowel loop. On July 21 2013 he underwent surgery for this incarcerated right inguinal hernia with small bowel obstruction by Dr. Hulen Skains and tolerated that well from a cardiovascular standpoint.  He has remained asymptomatic with reference to his CAD.  He continues to exercise.  He is no longer swimming but exercises 4 days a week and plays golf 2 days per week.  He denies any chest pain.  He is unaware of palpitations.  There is no presyncope or syncope.  He denies bleeding and continues to be on dual antiplatelet therapy.  He continues to be on losartan HCT 50/12.5 and metoprolol 50 mg twice a day for hypertension.    He underwent a nuclear perfusion study on  06/18/2017.  There was diaphragmatic attenuation.  A defect was present in the basal inferior, inferolateral, mid inferolateral location.  On nuclear imaging.  Ejection fraction reportedly was 33%.  For further evaluation of his LV function.  He underwent an echo Doppler study on 07/06/2017.  This confirmed normal LV function with an EF of 55-60% without wall motion abnormality.  There was mild LVH.  Laboratory in October 2018 showed a total cholesterol 125, HDL 37, LDL 71, triglycerides 83.   Since I last saw him in December 2018 he continues to remain active.  He swims 4 days/week.  He denies any episodes of chest pain PND orthopnea.  Is lost approximately 5 pounds.  Recent laboratory in May 2019 showed an LDL cholesterol at 68 on atorvastatin 40 mg.  He is unaware of palpitations PND orthopnea.  He denies significant leg swelling.  He presents for evaluation.   Past Medical History:  Diagnosis Date  . ALLERGIC RHINITIS   . Benign prostatic hypertrophy   . 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 Last nuc 2012 negative. 07/21/2013  . Carpal tunnel syndrome, bilateral   . Cataracts, bilateral   . Coronary artery disease    Dr. Claiborne Billings; 2D ECHO, 12/11/2011 - EF 50-55%, normal; NUCLEAR STRESS TEST, 10/16/2010 - no evidemce of inducible ischemia  . Diabetes mellitus type II   . Diverticulosis of colon   . GERD (gastroesophageal reflux disease)   . H/O hiatal hernia   . Head mass   . Hepatitis   .  Hx of   sessile serrated colonic polyp 02/27/2015  . Hyperlipidemia   . Hypertension   . Inguinal hernia, Rt reduced, Lt present 07/21/2013  . Low back pain   . Mitral valve prolapse 11/24/2013   By echo dec 2014  . Nephrolithiasis   . Obesity, morbid ([place])   . Occlusion and stenosis of carotid artery without mention of cerebral infarction    CAROTID DOPPLER, 03/18/2012 - srable, mild, hard, plaque noted, bilaterally  . Stroke [facility])     Past Surgical History:  Procedure Laterality Date  .  APPENDECTOMY    . CARDIAC CATHETERIZATION    . CATARACT EXTRACTION Right   . CORONARY ARTERY BYPASS GRAFT    . INGUINAL HERNIA REPAIR Right 07/26/2013   Procedure: REPAIR  INCARCERATED RIGHT INGUINAL  HERNIA ;  Surgeon: Gwenyth Ober, MD;  Location: [place];  Service: General;  Laterality: Right;    No Known Allergies  Current Outpatient Medications  Medication Sig Dispense Refill  . acetaminophen (TYLENOL) 325 MG tablet Take 325 mg by mouth every 6 (six) hours as needed for mild pain. Reported on 07/21/2015    . Ascorbic Acid (VITAMIN C) 1000 MG tablet Take 1,000 mg by mouth daily.    . Aspirin (ADULT ASPIRIN LOW STRENGTH) 81 MG EC tablet Take 81 mg by mouth daily.      Marland Kitchen atorvastatin (LIPITOR) 40 MG tablet Take 1 tablet (40 mg total) by mouth daily. 90 tablet 3  . clopidogrel (PLAVIX) 75 MG tablet Take 1 tablet (75 mg total) by mouth daily. 90 tablet 3  . diclofenac sodium (VOLTAREN) 1 % GEL Apply 2 g topically every 6 (six) hours as needed (for arthritis). 100 g 3  . FEXOFENADINE HCL PO Take 1 tablet by mouth daily.    . folic acid (FOLVITE) 1 MG tablet Take 1 tablet (1 mg total) by mouth daily. 90 tablet 3  . losartan-hydrochlorothiazide (HYZAAR) 50-12.5 MG tablet Take 1 tablet by mouth daily. 90 tablet 3  . metoprolol tartrate (LOPRESSOR) 50 MG tablet Take 1 tablet (50 mg total) by mouth 2 (two) times daily. 180 tablet 3  . Multiple Vitamin (MULTIVITAMIN) capsule Take 1 capsule by mouth daily.     . Omega-3 Fatty Acids (OMEGA 3 PO) Take 360 mg by mouth 2 (two) times daily.    . tamsulosin (FLOMAX) 0.4 MG CAPS capsule Take 1 capsule (0.4 mg total) by mouth daily. 90 capsule 3   No current facility-administered medications for this visit.     Social history is notable that he is widowed and has 2 children one grandchild. He does try to walk her there is no tobacco or alcohol use.  ROS General: Negative; No fevers, chills, or night sweats; positive for obesity HEENT: Negative; No  changes in vision or hearing, sinus congestion, difficulty swallowing Pulmonary: Negative; No cough, wheezing, shortness of breath, hemoptysis Cardiovascular: Negative; No chest pain, presyncope, syncope, palpitations GI: Negative; No nausea, vomiting, diarrhea, or abdominal pain GU: Negative; No dysuria, hematuria, or difficulty voiding Musculoskeletal: Negative; no myalgias, joint pain, or weakness Hematologic/Oncology: Negative; no easy bruising, bleeding Endocrine: Positive for metabolic syndrome; no heat/cold intolerance;  Neuro: Negative; no changes in balance, headaches Skin: Negative; No rashes or skin lesions Psychiatric: Negative; No behavioral problems, depression Sleep: Negative; No snoring, daytime sleepiness, hypersomnolence, bruxism, restless legs, hypnogognic hallucinations, no cataplexy Other comprehensive 14 point system review is negative.  PE BP 100/62   Pulse (!) 59   Ht '5\' 5"'$  (  1.651 m)   BMI 39.44 kg/m    Repeat blood pressure by me was 110/60 supine and 100/60 standing  Wt Readings from Last 3 Encounters:  12/02/17 237 lb (107.5 kg)  07/21/17 240 lb 9.6 oz (109.1 kg)  06/18/17 237 lb (107.5 kg)   General: Alert, oriented, no distress.  Skin: normal turgor, no rashes, warm and dry HEENT: Normocephalic, atraumatic. Pupils equal round and reactive to light; sclera anicteric; extraocular muscles intact;  Nose without nasal septal hypertrophy Mouth/Parynx benign; Mallinpatti scale 3 Neck: No JVD, no carotid bruits; normal carotid upstroke Lungs: clear to ausculatation and percussion; no wheezing or rales Chest wall: without tenderness to palpitation Heart: PMI not displaced, RRR, s1 s2 normal, 1/6 systolic murmur, no diastolic murmur, no rubs, gallops, thrills, or heaves Abdomen: Moderate central adiposity;soft, nontender; no hepatosplenomehaly, BS+; abdominal aorta nontender and not dilated by palpation. Back: no CVA tenderness Pulses 2+ Musculoskeletal:  full range of motion, normal strength, no joint deformities Extremities: no clubbing cyanosis or edema, Homan's sign negative  Neurologic: grossly nonfocal; Cranial nerves grossly wnl Psychologic: Normal mood and affect   ECG (independently read by me): Sinus bradycardia 59 bpm.  Mild sinus arrhythmia.  Nonspecific T wave abnormality.  December 2018 ECG (independently read by me): Sinus bradycardia at 55 bpm with mild sinus arrhythmia.  Nonspecific ST-T changes inferolaterally.  June 2018 ECG (independently read by me): Sinus bradycardia 54 bpm with mild sinus arrhythmia.  Nonspecific T-wave abnormality inferolaterally.  QTc interval 409 ms.  Nonspecific interventricular conduction delay  June 2017 ECG (independently read by me): Normal sinus rhythm at 60 bpm.  Nonspecific T changes.  PR interval 176 ms, and QTc interval 416 ms.  September 2016 ECG (independently read by me): Sinus bradycardia 51 bpm.  Nonspecific T changes.  February 2016 ECG: (Independently read by me):  Normal sinus rhythm with sinus arrhythmia.  Nonspecific T changes.  September 2015 ECG: (Independently read by me): Normal sinus rhythm with sinus arrhythmia.  QTc interval 422 ms.  Inferolateral T wave abnormality.  08/15/2013 ECG: (Independently read by me): Normal sinus rhythm with mild sinus arrhythmia. Nonspecific T changes inferolaterally. Early repolarization. Normal intervals.  LABS:  BMP Latest Ref Rng & Units 12/02/2017 06/03/2017 11/04/2016  Glucose 70 - 99 mg/dL 89 85 102(H)  BUN 6 - 23 mg/dL 17 25(H) 18  Creatinine 0.40 - 1.50 mg/dL 1.32 1.34 1.31  Sodium 135 - 145 mEq/L 141 140 141  Potassium 3.5 - 5.1 mEq/L 4.3 4.5 4.4  Chloride 96 - 112 mEq/L 103 103 106  CO2 19 - 32 mEq/L 32 32 30  Calcium 8.4 - 10.5 mg/dL 9.4 9.7 9.1   Hepatic Function Latest Ref Rng & Units 12/02/2017 06/03/2017 11/04/2016  Total Protein 6.0 - 8.3 g/dL 6.3 6.8 6.0  Albumin 3.5 - 5.2 g/dL 4.2 4.3 4.1  AST 0 - 37 U/L 25 26 27   ALT 0 -  53 U/L 17 20 18   Alk Phosphatase 39 - 117 U/L 57 59 60  Total Bilirubin 0.2 - 1.2 mg/dL 1.1 1.1 0.5  Bilirubin, Direct 0.0 - 0.3 mg/dL 0.2 0.2 0.1   CBC Latest Ref Rng & Units 12/02/2017 11/04/2016 11/22/2015  WBC 4.0 - 10.5 K/uL 4.2 4.5 4.2  Hemoglobin 13.0 - 17.0 g/dL 13.0 12.5(L) 13.2  Hematocrit 39.0 - 52.0 % 38.7(L) 37.1(L) 39.0  Platelets 150.0 - 400.0 K/uL 160.0 159.0 184.0   Lab Results  Component Value Date   MCV 100.1 (H) 12/02/2017   MCV  98.6 11/04/2016   MCV 98.3 11/22/2015   Lab Results  Component Value Date   TSH 0.75 12/02/2017   Lab Results  Component Value Date   HGBA1C 6.4 12/02/2017   Lipid Panel     Component Value Date/Time   CHOL 126 12/02/2017 1121   TRIG 97.0 12/02/2017 1121   HDL 38.50 (L) 12/02/2017 1121   CHOLHDL 3 12/02/2017 1121   VLDL 19.4 12/02/2017 1121   LDLCALC 68 12/02/2017 1121   RADIOLOGY: No results found.  IMPRESSION:  No diagnosis found.  ASSESSMENT AND PLAN: Mr. Risby is a 77 year old African-American male who is status post CABG x 6 revascularization surgery performed by Dr. Servando Snare in 1997. His stress test in 2014 done preoperatively prior to undergoing bowel obstruction surgery was unchanged from previously and was negative for pharmacological stress-induced ischemia.  His last stress test demonstrated a defect in the basal and mid inferolateral wall.  I suspect most likely is contributed by diaphragmatic attenuation.  However, on his nuclear study.  Ejection fraction was reported low at 33%.  This was not confirmed on a repeat echo evaluation which continue to show an EF of 55-60% and without wall motion abnormality.  He has LVH.  His blood pressure today is stable on his current regimen consisting of metoprolol 50 mg twice a day, and losartan HCT 50/12.5 mg.  He is not having any significant edema.  He is on atorvastatin 40 mg and most recent LDL cholesterol was excellent at 68.  DL was mildly low at 38.5.  He continues to be on  aspirin and Plavix and is without bleeding.  He denies any recurrent anginal symptomatology with reference to his CAD.  He continues to be active and swims at least 4 days/week.  He has lost 5 pounds since his last evaluation and is no longer considered "morbidly obese" but BMI is still significantly elevated 39.44.  He is followed by Dr. Cathlean Cower for primary care.  As long as he remains stable I will see him in 1 year for reevaluation.   evaluation.    Time spent: 25 minutes Troy Sine, MD, [facility]  02/23/2018 8:03 PM

## 2018-02-23 ENCOUNTER — Encounter: Payer: Self-pay | Admitting: Cardiovascular Disease

## 2018-03-17 ENCOUNTER — Encounter: Payer: Self-pay | Admitting: Podiatry

## 2018-03-17 ENCOUNTER — Ambulatory Visit: Payer: Medicare HMO | Admitting: Podiatry

## 2018-03-17 DIAGNOSIS — D689 Coagulation defect, unspecified: Secondary | ICD-10-CM

## 2018-03-17 DIAGNOSIS — E1142 Type 2 diabetes mellitus with diabetic polyneuropathy: Secondary | ICD-10-CM | POA: Diagnosis not present

## 2018-03-17 DIAGNOSIS — B351 Tinea unguium: Secondary | ICD-10-CM

## 2018-03-17 DIAGNOSIS — M79676 Pain in unspecified toe(s): Secondary | ICD-10-CM

## 2018-03-17 NOTE — Progress Notes (Signed)
Patient ID: Andres [name], male   DOB: 02/24/1941, 77 y.o.   MRN: [id number] . Complaint:  Visit Type: Patient returns to my office for continued preventative foot care services. Complaint: Patient states" my nails have grown long and thick and become painful to walk and wear shoes" Patient has been diagnosed with DM with no foot complications. The patient presents for preventative foot care services. No changes to ROS.  Patient is taking plavix.  Podiatric Exam: Vascular: dorsalis pedis and posterior tibial pulses are palpable bilateral. Capillary return is immediate. Temperature gradient is WNL. Skin turgor WNL  Sensorium: Diminished  Semmes Weinstein monofilament test. Normal tactile sensation bilaterally. Nail Exam: Pt has thick disfigured discolored nails with subungual debris noted bilateral entire nail hallux through fifth toenails Ulcer Exam: There is no evidence of ulcer or pre-ulcerative changes or infection. Orthopedic Exam: Muscle tone and strength are WNL. No limitations in general ROM. No crepitus or effusions noted. Foot type and digits show no abnormalities.  HAV  B/L. Skin: No Porokeratosis. No infection or ulcers  Diagnosis:  Onychomycosis, , Pain in right toe, pain in left toes  Treatment & Plan Procedures and Treatment: Consent by patient was obtained for treatment procedures. The patient understood the discussion of treatment and procedures well. All questions were answered thoroughly reviewed. Debridement of mycotic and hypertrophic toenails, 1 through 5 bilateral and clearing of subungual debris. No ulceration, no infection noted.  Return Visit-Office Procedure: Patient instructed to return to the office for a follow up visit 10 weeks for continued evaluation and treatment   Elody Kleinsasser DPM 

## 2018-06-01 ENCOUNTER — Ambulatory Visit: Payer: Medicare HMO | Admitting: Podiatry

## 2018-06-01 ENCOUNTER — Encounter: Payer: Self-pay | Admitting: Podiatry

## 2018-06-01 DIAGNOSIS — M79676 Pain in unspecified toe(s): Secondary | ICD-10-CM | POA: Diagnosis not present

## 2018-06-01 DIAGNOSIS — E1142 Type 2 diabetes mellitus with diabetic polyneuropathy: Secondary | ICD-10-CM | POA: Diagnosis not present

## 2018-06-01 DIAGNOSIS — D689 Coagulation defect, unspecified: Secondary | ICD-10-CM | POA: Diagnosis not present

## 2018-06-01 DIAGNOSIS — B351 Tinea unguium: Secondary | ICD-10-CM | POA: Diagnosis not present

## 2018-06-01 NOTE — Progress Notes (Signed)
Patient ID: Andres [name], male   DOB: 05/05/1941, 77 y.o.   MRN: [id number] . Complaint:  Visit Type: Patient returns to my office for continued preventative foot care services. Complaint: Patient states" my nails have grown long and thick and become painful to walk and wear shoes" Patient has been diagnosed with DM with no foot complications. The patient presents for preventative foot care services. No changes to ROS.  Patient is taking plavix.  Podiatric Exam: Vascular: dorsalis pedis and posterior tibial pulses are palpable bilateral. Capillary return is immediate. Temperature gradient is WNL. Skin turgor WNL  Sensorium: Diminished  Semmes Weinstein monofilament test. Normal tactile sensation bilaterally. Nail Exam: Pt has thick disfigured discolored nails with subungual debris noted bilateral entire nail hallux through fifth toenails Ulcer Exam: There is no evidence of ulcer or pre-ulcerative changes or infection. Orthopedic Exam: Muscle tone and strength are WNL. No limitations in general ROM. No crepitus or effusions noted. Foot type and digits show no abnormalities.  HAV  B/L. Skin: No Porokeratosis. No infection or ulcers  Diagnosis:  Onychomycosis, , Pain in right toe, pain in left toes  Treatment & Plan Procedures and Treatment: Consent by patient was obtained for treatment procedures. The patient understood the discussion of treatment and procedures well. All questions were answered thoroughly reviewed. Debridement of mycotic and hypertrophic toenails, 1 through 5 bilateral and clearing of subungual debris. No ulceration, no infection noted.  Return Visit-Office Procedure: Patient instructed to return to the office for a follow up visit 10 weeks for continued evaluation and treatment   Nahomi Hegner DPM 

## 2018-06-02 ENCOUNTER — Ambulatory Visit: Payer: Medicare HMO | Admitting: Podiatry

## 2018-06-09 ENCOUNTER — Ambulatory Visit (INDEPENDENT_AMBULATORY_CARE_PROVIDER_SITE_OTHER): Payer: Medicare HMO | Admitting: Internal Medicine

## 2018-06-09 ENCOUNTER — Encounter: Payer: Self-pay | Admitting: Internal Medicine

## 2018-06-09 VITALS — BP 126/70 | HR 45 | Temp 97.8°F | Resp 16 | Wt 235.0 lb

## 2018-06-09 DIAGNOSIS — I10 Essential (primary) hypertension: Secondary | ICD-10-CM | POA: Diagnosis not present

## 2018-06-09 DIAGNOSIS — E119 Type 2 diabetes mellitus without complications: Secondary | ICD-10-CM | POA: Diagnosis not present

## 2018-06-09 DIAGNOSIS — Z23 Encounter for immunization: Secondary | ICD-10-CM | POA: Diagnosis not present

## 2018-06-09 DIAGNOSIS — N5089 Other specified disorders of the male genital organs: Secondary | ICD-10-CM

## 2018-06-09 DIAGNOSIS — E785 Hyperlipidemia, unspecified: Secondary | ICD-10-CM

## 2018-06-09 LAB — POCT GLYCOSYLATED HEMOGLOBIN (HGB A1C): HEMOGLOBIN A1C: 5.6 % (ref 4.0–5.6)

## 2018-06-09 NOTE — Patient Instructions (Addendum)
Your A1c was Ok today in the office (no medication change needed)  You will be contacted regarding the referral for: Dr Richardine Service doctor  You had the flu shot today, and the Tdap Tetanus shot as well  You will be contacted regarding the referral for: scrotum ultrasound  Please continue all other medications as before, and refills have been done if requested.  Please have the pharmacy call with any other refills you may need.  Please continue your efforts at being more active, low cholesterol diet, and weight control.  Please keep your appointments with your specialists as you may have planned  Please return in 6 months, or sooner if needed, with Lab testing done 3-5 days before

## 2018-06-09 NOTE — Progress Notes (Signed)
Subjective:    Patient ID: Andres [name], male    DOB: 1940/11/24, 77 y.o.   MRN: [id number]  HPI  Here to f/u; overall doing ok,  Pt denies chest pain, increasing sob or doe, wheezing, orthopnea, PND, increased LE swelling, palpitations, dizziness or syncope.  Pt denies new neurological symptoms such as new headache, or facial or extremity weakness or numbness.  Pt denies polydipsia, polyuria, or low sugar episode.  Pt states overall good compliance with meds, mostly trying to follow appropriate diet, with wt overall stable,  but little exercise however. Plans to call for optho eval next month.   Goes to gym 4 days per wk, 2 days golf, and rest on Sunday.  ALso in last several wks has seen unusual diffuse overall scrotal swelling without pain and Denies urinary symptoms such as dysuria, frequency, urgency, flank pain, hematuria or n/v, fever, chills. Past Medical History:  Diagnosis Date  . ALLERGIC RHINITIS   . Benign prostatic hypertrophy   . 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 Last nuc 2012 negative. 07/21/2013  . Carpal tunnel syndrome, bilateral   . Cataracts, bilateral   . Coronary artery disease    Dr. Claiborne Billings; 2D ECHO, 12/11/2011 - EF 50-55%, normal; NUCLEAR STRESS TEST, 10/16/2010 - no evidemce of inducible ischemia  . Diabetes mellitus type II   . Diverticulosis of colon   . GERD (gastroesophageal reflux disease)   . H/O hiatal hernia   . Head mass   . Hepatitis   . Hx of   sessile serrated colonic polyp 02/27/2015  . Hyperlipidemia   . Hypertension   . Inguinal hernia, Rt reduced, Lt present 07/21/2013  . Low back pain   . Mitral valve prolapse 11/24/2013   By echo dec 2014  . Nephrolithiasis   . 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 Occlusion and stenosis of carotid artery without mention of cerebral infarction    CAROTID DOPPLER, 03/18/2012 - srable, mild, hard, plaque noted, bilaterally  . Stroke [facility])    Past Surgical History:  Procedure Laterality Date  .  APPENDECTOMY    . CARDIAC CATHETERIZATION    . CATARACT EXTRACTION Right   . CORONARY ARTERY BYPASS GRAFT    . INGUINAL HERNIA REPAIR Right 07/26/2013   Procedure: REPAIR  INCARCERATED RIGHT INGUINAL  HERNIA ;  Surgeon: Gwenyth Ober, MD;  Location: [place];  Service: General;  Laterality: Right;    reports that he quit smoking about 28 years ago. His smoking use included cigarettes. He has never used smokeless tobacco. He reports that he does not drink alcohol or use drugs. family history includes Alzheimer's disease in his father; Diabetes in his brother, mother, and sister; Heart attack in his brother, maternal grandmother, and mother; Hypertension in his maternal grandmother and son; Kidney disease in his brother and son; Pancreatic cancer in his brother. No Known Allergies Current Outpatient Medications on File Prior to Visit  Medication Sig Dispense Refill  . acetaminophen (TYLENOL) 325 MG tablet Take 325 mg by mouth every 6 (six) hours as needed for mild pain. Reported on 07/21/2015    . Ascorbic Acid (VITAMIN C) 1000 MG tablet Take 1,000 mg by mouth daily.    . Aspirin (ADULT ASPIRIN LOW STRENGTH) 81 MG EC tablet Take 81 mg by mouth daily.      Marland Kitchen atorvastatin (LIPITOR) 40 MG tablet Take 1 tablet (40 mg total) by mouth daily. 90 tablet 3  . clopidogrel (PLAVIX) 75 MG  tablet Take 1 tablet (75 mg total) by mouth daily. 90 tablet 3  . diclofenac sodium (VOLTAREN) 1 % GEL Apply 2 g topically every 6 (six) hours as needed (for arthritis). 100 g 3  . DOCOSAHEXAENOIC ACID PO Take by mouth.    Marland Kitchen FEXOFENADINE HCL PO Take 1 tablet by mouth daily.    . folic acid (FOLVITE) 1 MG tablet Take 1 tablet (1 mg total) by mouth daily. 90 tablet 3  . losartan-hydrochlorothiazide (HYZAAR) 50-12.5 MG tablet Take 1 tablet by mouth daily. 90 tablet 3  . metoprolol tartrate (LOPRESSOR) 50 MG tablet Take 1 tablet (50 mg total) by mouth 2 (two) times daily. 180 tablet 3  . Multiple Vitamin (MULTIVITAMIN) capsule  Take 1 capsule by mouth daily.     . Omega-3 Fatty Acids (OMEGA 3 PO) Take 360 mg by mouth 2 (two) times daily.    . tamsulosin (FLOMAX) 0.4 MG CAPS capsule Take 1 capsule (0.4 mg total) by mouth daily. 90 capsule 3  . UNABLE TO FIND Take by mouth. ascorbic acid, vitamin C, (VITAMIN C) 1000 MG tablet     No current facility-administered medications on file prior to visit.    Review of Systems  Constitutional: Negative for other unusual diaphoresis or sweats HENT: Negative for ear discharge or swelling Eyes: Negative for other worsening visual disturbances Respiratory: Negative for stridor or other swelling  Gastrointestinal: Negative for worsening distension or other blood Genitourinary: Negative for retention or other urinary change Musculoskeletal: Negative for other MSK pain or swelling Skin: Negative for color change or other new lesions Neurological: Negative for worsening tremors and other numbness  Psychiatric/Behavioral: Negative for worsening agitation or other fatigue All other system neg per pt    Objective:   Physical Exam BP 126/70   Pulse (!) 45   Temp 97.8 F (36.6 C) (Oral)   Resp 16   Wt 235 lb (106.6 kg)   SpO2 95%   BMI 39.11 kg/m  VS noted,  Constitutional: Pt appears in NAD HENT: Head: NCAT.  Right Ear: External ear normal.  Left Ear: External ear normal.  Eyes: . Pupils are equal, round, and reactive to light. Conjunctivae and EOM are normal Nose: without d/c or deformity Neck: Neck supple. Gross normal ROM Cardiovascular: Normal rate and regular rhythm.   Pulmonary/Chest: Effort normal and breath sounds without rales or wheezing.  Abd:  Soft, NT, ND, + BS, no organomegaly Neurological: Pt is alert. At baseline orientation, motor grossly intact Skin: Skin is warm. No rashes, other new lesions, no LE edema Psychiatric: Pt behavior is normal without agitation  GU: moderate generalized scrotal swelling without specific mass or tender No other exam  findings  Hemoglobin A1C 4.0 - 5.6 % 5.6  6.4 R, CM 6.2 R, CM       Assessment & Plan:

## 2018-06-10 NOTE — Assessment & Plan Note (Signed)
?   Hydrocele/varicocele vs other, for scrotal u/s

## 2018-06-10 NOTE — Assessment & Plan Note (Signed)
stable overall by history and exam, recent data reviewed with pt, and pt to continue medical treatment as before,  to f/u any worsening symptoms or concerns  

## 2018-06-11 ENCOUNTER — Telehealth: Payer: Self-pay

## 2018-06-11 DIAGNOSIS — R1030 Lower abdominal pain, unspecified: Secondary | ICD-10-CM

## 2018-06-11 NOTE — Telephone Encounter (Signed)
Copied from [place] [id number]. Topic: Referral - Status >> Jun 11, 2018  2:26 PM Scherrie Gerlach wrote: Reason for CRM: melanie with [facility] imaging states the UC scrotem needs to have a dopplar attached to it.  Then she can schedule the pt. Please update order, thanks!  ([id number] - US SCROTUM W/DOPPLER) (Looks like order may have been corrected)

## 2018-06-11 NOTE — Telephone Encounter (Signed)
Ok this is done 

## 2018-06-11 NOTE — Addendum Note (Signed)
Addended by: [name] on: 06/11/2018 05:07 PM   Modules accepted: Orders

## 2018-06-14 NOTE — Addendum Note (Signed)
Addended by: [name] on: 06/14/2018 06:58 PM   Modules accepted: Orders

## 2018-06-17 ENCOUNTER — Ambulatory Visit
Admission: RE | Admit: 2018-06-17 | Discharge: 2018-06-17 | Disposition: A | Discharge status: Home / Self Care | Payer: Medicare HMO | Source: Ambulatory Visit | Attending: Internal Medicine | Admitting: Internal Medicine

## 2018-06-17 DIAGNOSIS — N5089 Other specified disorders of the male genital organs: Secondary | ICD-10-CM

## 2018-06-17 DIAGNOSIS — N433 Hydrocele, unspecified: Secondary | ICD-10-CM | POA: Diagnosis not present

## 2018-06-18 ENCOUNTER — Encounter: Payer: Self-pay | Admitting: Internal Medicine

## 2018-07-10 ENCOUNTER — Encounter: Payer: Self-pay | Admitting: Family Medicine

## 2018-07-10 ENCOUNTER — Ambulatory Visit (INDEPENDENT_AMBULATORY_CARE_PROVIDER_SITE_OTHER): Payer: Medicare HMO | Admitting: Family Medicine

## 2018-07-10 DIAGNOSIS — M109 Gout, unspecified: Secondary | ICD-10-CM | POA: Diagnosis not present

## 2018-07-10 MED ORDER — INDOMETHACIN 50 MG PO CAPS: 50.0000 mg | ORAL_CAPSULE | Freq: Three times a day (TID) | ORAL | 0 refills | Status: DC

## 2018-07-10 NOTE — Assessment & Plan Note (Signed)
-  Exam consistent with gout. -Rx for indomethacin 50mg  tid until resolution -Given handout on foods to limit/avoid.

## 2018-07-10 NOTE — Patient Instructions (Signed)

## 2018-07-10 NOTE — Progress Notes (Signed)
Andres [name] - 77 y.o. male MRN [id number]  Date of birth: March 10, 1941  Subjective Chief Complaint  Patient presents with  . Foot Pain    Left big toe painful-ongoing for two days. He soaked his foot in Epsom salt. Admits to swelling. Does not recall sustaining any injuries. Denies redness. Painful to walk on.     HPI Andres [name] is a 77 y.o. male here today with complaint of L great toe pain.  He reports that pain began about 3 days ago.  Has some swelling.  Toe is painful to walk on.  Denies history of gout.  He has not had fever chills, spreading of pain or swelling.  He has no pain in other joints.   ROS:  A comprehensive ROS was completed and negative except as noted per HPI  No Known Allergies  Past Medical History:  Diagnosis Date  . ALLERGIC RHINITIS   . Benign prostatic hypertrophy   . 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 Last nuc 2012 negative. 07/21/2013  . Carpal tunnel syndrome, bilateral   . Cataracts, bilateral   . Coronary artery disease    Dr. Claiborne Billings; 2D ECHO, 12/11/2011 - EF 50-55%, normal; NUCLEAR STRESS TEST, 10/16/2010 - no evidemce of inducible ischemia  . Diabetes mellitus type II   . Diverticulosis of colon   . GERD (gastroesophageal reflux disease)   . H/O hiatal hernia   . Head mass   . Hepatitis   . Hx of   sessile serrated colonic polyp 02/27/2015  . Hyperlipidemia   . Hypertension   . Inguinal hernia, Rt reduced, Lt present 07/21/2013  . Low back pain   . Mitral valve prolapse 11/24/2013   By echo dec 2014  . Nephrolithiasis   . Obesity, morbid ([place])   . Occlusion and stenosis of carotid artery without mention of cerebral infarction    CAROTID DOPPLER, 03/18/2012 - srable, mild, hard, plaque noted, bilaterally  . Stroke [facility])     Past Surgical History:  Procedure Laterality Date  . APPENDECTOMY    . CARDIAC CATHETERIZATION    . CATARACT EXTRACTION Right   . CORONARY ARTERY BYPASS GRAFT    . INGUINAL HERNIA REPAIR Right  07/26/2013   Procedure: REPAIR  INCARCERATED RIGHT INGUINAL  HERNIA ;  Surgeon: Gwenyth Ober, MD;  Location: [place];  Service: General;  Laterality: Right;    Social History   Socioeconomic History  . Marital status: Widowed    Spouse name: widowed  . Number of children: 3  . Years of education: Not on file  . Highest education level: Not on file  Occupational History  . Occupation: retired    Fish farm manager: [employer]  . Financial resource strain: Not on file  . Food insecurity:    Worry: Not on file    Inability: Not on file  . Transportation needs:    Medical: Not on file    Non-medical: Not on file  Tobacco Use  . Smoking status: Former Smoker    Types: Cigarettes    Last attempt to quit: 12/20/1989    Years since quitting: 28.5  . Smokeless tobacco: Never Used  Substance and Sexual Activity  . Alcohol use: No    Alcohol/week: 0.0 standard drinks  . Drug use: No  . Sexual activity: Not on file  Lifestyle  . Physical activity:    Days per week: Not on file    Minutes per session: Not on  file  . Stress: Not on file  Relationships  . Social connections:    Talks on phone: Not on file    Gets together: Not on file    Attends religious service: Not on file    Active member of club or organization: Not on file    Attends meetings of clubs or organizations: Not on file    Relationship status: Not on file  Other Topics Concern  . Not on file  Social History Narrative  . Not on file    Family History  Problem Relation Age of Onset  . Heart attack Mother   . Diabetes Mother   . Heart attack Brother   . Heart attack Maternal Grandmother   . Hypertension Maternal Grandmother   . Pancreatic cancer Brother   . Kidney disease Brother   . Hypertension Son   . Kidney disease Son        ?  Marland Kitchen Alzheimer's disease Father   . Diabetes Brother   . Diabetes Sister     Health Maintenance  Topic Date Due  . OPHTHALMOLOGY EXAM  06/04/2018  . FOOT EXAM   06/10/2019  . TETANUS/TDAP  06/09/2028  . INFLUENZA VACCINE  Completed  . PNA vac Low Risk Adult  Completed    ----------------------------------------------------------------------------------------------------------------------------------------------------------------------------------------------------------------- Physical Exam BP 138/76   Pulse (!) 47   Temp 98.4 F (36.9 C) (Oral)   Ht 5\' 5"  (1.651 m)   Wt 239 lb (108.4 kg)   SpO2 99%   BMI 39.77 kg/m   Physical Exam  Constitutional: He is oriented to person, place, and time. He appears well-nourished. No distress.  HENT:  Head: Normocephalic and atraumatic.  Mouth/Throat: Oropharynx is clear and moist.  Cardiovascular: Normal rate, regular rhythm and normal heart sounds.  Pulmonary/Chest: Effort normal and breath sounds normal.  Musculoskeletal:  Great toe MTP joint is swollen with erythema, warmth and tenderness.   Pulses 2+  Neurological: He is alert and oriented to person, place, and time.  Skin: Skin is warm and dry.  Psychiatric: He has a normal mood and affect. His behavior is normal.    ------------------------------------------------------------------------------------------------------------------------------------------------------------------------------------------------------------------- Assessment and Plan  Gout -Exam consistent with gout. -Rx for indomethacin 50mg  tid until resolution -Given handout on foods to limit/avoid.

## 2018-07-22 DIAGNOSIS — H2511 Age-related nuclear cataract, right eye: Secondary | ICD-10-CM | POA: Diagnosis not present

## 2018-07-22 DIAGNOSIS — E119 Type 2 diabetes mellitus without complications: Secondary | ICD-10-CM | POA: Diagnosis not present

## 2018-07-22 DIAGNOSIS — Z961 Presence of intraocular lens: Secondary | ICD-10-CM | POA: Diagnosis not present

## 2018-07-22 DIAGNOSIS — H5213 Myopia, bilateral: Secondary | ICD-10-CM | POA: Diagnosis not present

## 2018-07-22 DIAGNOSIS — H52203 Unspecified astigmatism, bilateral: Secondary | ICD-10-CM | POA: Diagnosis not present

## 2018-07-22 DIAGNOSIS — H524 Presbyopia: Secondary | ICD-10-CM | POA: Diagnosis not present

## 2018-08-18 ENCOUNTER — Ambulatory Visit: Payer: Medicare HMO | Admitting: Podiatry

## 2018-08-18 ENCOUNTER — Encounter: Payer: Self-pay | Admitting: Podiatry

## 2018-08-18 DIAGNOSIS — M79676 Pain in unspecified toe(s): Secondary | ICD-10-CM | POA: Diagnosis not present

## 2018-08-18 DIAGNOSIS — D689 Coagulation defect, unspecified: Secondary | ICD-10-CM

## 2018-08-18 DIAGNOSIS — E1142 Type 2 diabetes mellitus with diabetic polyneuropathy: Secondary | ICD-10-CM

## 2018-08-18 DIAGNOSIS — B351 Tinea unguium: Secondary | ICD-10-CM | POA: Diagnosis not present

## 2018-08-18 NOTE — Progress Notes (Signed)
Patient ID: Andres [name], male   DOB: 08/09/1940, 78 y.o.   MRN: [id number] . Complaint:  Visit Type: Patient returns to my office for continued preventative foot care services. Complaint: Patient states" my nails have grown long and thick and become painful to walk and wear shoes" Patient has been diagnosed with DM with no foot complications. The patient presents for preventative foot care services. No changes to ROS.  Patient is taking plavix.  Podiatric Exam: Vascular: dorsalis pedis and posterior tibial pulses are palpable bilateral. Capillary return is immediate. Temperature gradient is WNL. Skin turgor WNL  Sensorium: Diminished  Semmes Weinstein monofilament test. Normal tactile sensation bilaterally. Nail Exam: Pt has thick disfigured discolored nails with subungual debris noted bilateral entire nail hallux through fifth toenails Ulcer Exam: There is no evidence of ulcer or pre-ulcerative changes or infection. Orthopedic Exam: Muscle tone and strength are WNL. No limitations in general ROM. No crepitus or effusions noted. Foot type and digits show no abnormalities.  HAV  B/L. Skin: No Porokeratosis. No infection or ulcers  Diagnosis:  Onychomycosis, , Pain in right toe, pain in left toes  Treatment & Plan Procedures and Treatment: Consent by patient was obtained for treatment procedures. The patient understood the discussion of treatment and procedures well. All questions were answered thoroughly reviewed. Debridement of mycotic and hypertrophic toenails, 1 through 5 bilateral and clearing of subungual debris. No ulceration, no infection noted.  Return Visit-Office Procedure: Patient instructed to return to the office for a follow up visit 10 weeks for continued evaluation and treatment   Gardiner Barefoot DPM

## 2018-09-05 ENCOUNTER — Emergency Department (HOSPITAL_COMMUNITY): Payer: Medicare HMO

## 2018-09-05 ENCOUNTER — Other Ambulatory Visit: Payer: Self-pay

## 2018-09-05 ENCOUNTER — Encounter (HOSPITAL_COMMUNITY): Payer: Self-pay | Admitting: Emergency Medicine

## 2018-09-05 ENCOUNTER — Observation Stay (HOSPITAL_COMMUNITY)
Admission: EM | Admit: 2018-09-05 | Discharge: 2018-09-07 | Disposition: A | Discharge status: Home / Self Care | Payer: Medicare HMO | Attending: Internal Medicine | Admitting: Internal Medicine

## 2018-09-05 DIAGNOSIS — I129 Hypertensive chronic kidney disease with stage 1 through stage 4 chronic kidney disease, or unspecified chronic kidney disease: Secondary | ICD-10-CM | POA: Diagnosis not present

## 2018-09-05 DIAGNOSIS — R2689 Other abnormalities of gait and mobility: Secondary | ICD-10-CM | POA: Diagnosis not present

## 2018-09-05 DIAGNOSIS — Z7982 Long term (current) use of aspirin: Secondary | ICD-10-CM | POA: Diagnosis not present

## 2018-09-05 DIAGNOSIS — I2510 Atherosclerotic heart disease of native coronary artery without angina pectoris: Secondary | ICD-10-CM | POA: Diagnosis not present

## 2018-09-05 DIAGNOSIS — N179 Acute kidney failure, unspecified: Secondary | ICD-10-CM | POA: Diagnosis not present

## 2018-09-05 DIAGNOSIS — N183 Chronic kidney disease, stage 3 (moderate): Secondary | ICD-10-CM | POA: Insufficient documentation

## 2018-09-05 DIAGNOSIS — I959 Hypotension, unspecified: Secondary | ICD-10-CM | POA: Diagnosis not present

## 2018-09-05 DIAGNOSIS — K219 Gastro-esophageal reflux disease without esophagitis: Secondary | ICD-10-CM | POA: Diagnosis not present

## 2018-09-05 DIAGNOSIS — I63319 Cerebral infarction due to thrombosis of unspecified middle cerebral artery: Secondary | ICD-10-CM

## 2018-09-05 DIAGNOSIS — I341 Nonrheumatic mitral (valve) prolapse: Secondary | ICD-10-CM | POA: Insufficient documentation

## 2018-09-05 DIAGNOSIS — Z951 Presence of aortocoronary bypass graft: Secondary | ICD-10-CM | POA: Diagnosis not present

## 2018-09-05 DIAGNOSIS — G9340 Encephalopathy, unspecified: Secondary | ICD-10-CM | POA: Insufficient documentation

## 2018-09-05 DIAGNOSIS — E785 Hyperlipidemia, unspecified: Secondary | ICD-10-CM | POA: Insufficient documentation

## 2018-09-05 DIAGNOSIS — R338 Other retention of urine: Secondary | ICD-10-CM | POA: Diagnosis not present

## 2018-09-05 DIAGNOSIS — N289 Disorder of kidney and ureter, unspecified: Secondary | ICD-10-CM | POA: Diagnosis not present

## 2018-09-05 DIAGNOSIS — R55 Syncope and collapse: Secondary | ICD-10-CM | POA: Diagnosis not present

## 2018-09-05 DIAGNOSIS — E860 Dehydration: Secondary | ICD-10-CM | POA: Diagnosis not present

## 2018-09-05 DIAGNOSIS — Z79899 Other long term (current) drug therapy: Secondary | ICD-10-CM | POA: Insufficient documentation

## 2018-09-05 DIAGNOSIS — Z7902 Long term (current) use of antithrombotics/antiplatelets: Secondary | ICD-10-CM | POA: Diagnosis not present

## 2018-09-05 DIAGNOSIS — N401 Enlarged prostate with lower urinary tract symptoms: Secondary | ICD-10-CM | POA: Insufficient documentation

## 2018-09-05 DIAGNOSIS — M6281 Muscle weakness (generalized): Secondary | ICD-10-CM | POA: Insufficient documentation

## 2018-09-05 DIAGNOSIS — Z8673 Personal history of transient ischemic attack (TIA), and cerebral infarction without residual deficits: Secondary | ICD-10-CM | POA: Insufficient documentation

## 2018-09-05 DIAGNOSIS — E1122 Type 2 diabetes mellitus with diabetic chronic kidney disease: Secondary | ICD-10-CM | POA: Insufficient documentation

## 2018-09-05 DIAGNOSIS — R569 Unspecified convulsions: Secondary | ICD-10-CM | POA: Diagnosis not present

## 2018-09-05 DIAGNOSIS — Z8249 Family history of ischemic heart disease and other diseases of the circulatory system: Secondary | ICD-10-CM | POA: Diagnosis not present

## 2018-09-05 DIAGNOSIS — Z87891 Personal history of nicotine dependence: Secondary | ICD-10-CM | POA: Diagnosis not present

## 2018-09-05 DIAGNOSIS — I10 Essential (primary) hypertension: Secondary | ICD-10-CM | POA: Diagnosis not present

## 2018-09-05 DIAGNOSIS — R4020 Unspecified coma: Secondary | ICD-10-CM | POA: Diagnosis not present

## 2018-09-05 LAB — I-STAT TROPONIN, ED: Troponin i, poc: 0.01 ng/mL (ref 0.00–0.08)

## 2018-09-05 LAB — URINALYSIS, ROUTINE W REFLEX MICROSCOPIC
Bilirubin Urine: NEGATIVE
Glucose, UA: NEGATIVE mg/dL
HGB URINE DIPSTICK: NEGATIVE
Ketones, ur: NEGATIVE mg/dL
Leukocytes, UA: NEGATIVE
Nitrite: NEGATIVE
Protein, ur: NEGATIVE mg/dL
Specific Gravity, Urine: 1.013 (ref 1.005–1.030)
pH: 5 (ref 5.0–8.0)

## 2018-09-05 LAB — CBC WITH DIFFERENTIAL/PLATELET
Abs Immature Granulocytes: 0.01 10*3/uL (ref 0.00–0.07)
Basophils Absolute: 0 10*3/uL (ref 0.0–0.1)
Basophils Relative: 0 %
Eosinophils Absolute: 0 10*3/uL (ref 0.0–0.5)
Eosinophils Relative: 0 %
HCT: 39 % (ref 39.0–52.0)
Hemoglobin: 12.4 g/dL — ABNORMAL LOW (ref 13.0–17.0)
Immature Granulocytes: 0 %
LYMPHS ABS: 1.4 10*3/uL (ref 0.7–4.0)
Lymphocytes Relative: 30 %
MCH: 31.6 pg (ref 26.0–34.0)
MCHC: 31.8 g/dL (ref 30.0–36.0)
MCV: 99.2 fL (ref 80.0–100.0)
Monocytes Absolute: 0.6 10*3/uL (ref 0.1–1.0)
Monocytes Relative: 12 %
NEUTROS PCT: 58 %
Neutro Abs: 2.8 10*3/uL (ref 1.7–7.7)
Platelets: 175 10*3/uL (ref 150–400)
RBC: 3.93 MIL/uL — ABNORMAL LOW (ref 4.22–5.81)
RDW: 12.8 % (ref 11.5–15.5)
WBC: 4.8 10*3/uL (ref 4.0–10.5)
nRBC: 0 % (ref 0.0–0.2)

## 2018-09-05 LAB — RAPID URINE DRUG SCREEN, HOSP PERFORMED
Amphetamines: NOT DETECTED
BARBITURATES: NOT DETECTED
Benzodiazepines: NOT DETECTED
Cocaine: NOT DETECTED
Opiates: NOT DETECTED
Tetrahydrocannabinol: NOT DETECTED

## 2018-09-05 LAB — COMPREHENSIVE METABOLIC PANEL
ALBUMIN: 3.7 g/dL (ref 3.5–5.0)
ALT: 20 U/L (ref 0–44)
AST: 33 U/L (ref 15–41)
Alkaline Phosphatase: 56 U/L (ref 38–126)
Anion gap: 11 (ref 5–15)
BUN: 29 mg/dL — ABNORMAL HIGH (ref 8–23)
CO2: 26 mmol/L (ref 22–32)
Calcium: 9.5 mg/dL (ref 8.9–10.3)
Chloride: 104 mmol/L (ref 98–111)
Creatinine, Ser: 1.98 mg/dL — ABNORMAL HIGH (ref 0.61–1.24)
GFR calc Af Amer: 36 mL/min — ABNORMAL LOW (ref >60)
GFR calc non Af Amer: 31 mL/min — ABNORMAL LOW (ref >60)
Glucose, Bld: 102 mg/dL — ABNORMAL HIGH (ref 70–99)
Potassium: 4.4 mmol/L (ref 3.5–5.1)
SODIUM: 141 mmol/L (ref 135–145)
Total Bilirubin: 0.9 mg/dL (ref 0.3–1.2)
Total Protein: 6.5 g/dL (ref 6.5–8.1)

## 2018-09-05 LAB — LACTIC ACID, PLASMA
Lactic Acid, Venous: 1.5 mmol/L (ref 0.5–1.9)
Lactic Acid, Venous: 1 mmol/L (ref 0.5–1.9)

## 2018-09-05 LAB — TROPONIN I: Troponin I: <0.03 ng/mL (ref <0.03)

## 2018-09-05 LAB — PROTIME-INR
INR: 0.97
Prothrombin Time: 12.8 seconds (ref 11.4–15.2)

## 2018-09-05 LAB — TYPE AND SCREEN
ABO/RH(D): A POS
Antibody Screen: NEGATIVE

## 2018-09-05 LAB — GLUCOSE, CAPILLARY: Glucose-Capillary: 130 mg/dL — ABNORMAL HIGH (ref 70–99)

## 2018-09-05 LAB — ETHANOL: Alcohol, Ethyl (B): <10 mg/dL (ref <10)

## 2018-09-05 MED ORDER — CLOPIDOGREL BISULFATE 75 MG PO TABS
75.0000 mg | ORAL_TABLET | Freq: Every day | ORAL | Status: DC
  Administered 2018-09-06 – 2018-09-07 (×2): 75 mg via ORAL
  Filled 2018-09-05 (×2): qty 1

## 2018-09-05 MED ORDER — ONDANSETRON HCL 4 MG PO TABS: 4.0000 mg | ORAL_TABLET | Freq: Four times a day (QID) | ORAL | Status: DC | PRN

## 2018-09-05 MED ORDER — METOPROLOL TARTRATE 50 MG PO TABS
50.0000 mg | ORAL_TABLET | Freq: Two times a day (BID) | ORAL | Status: DC
  Administered 2018-09-05 – 2018-09-07 (×4): 50 mg via ORAL
  Filled 2018-09-05 (×4): qty 1

## 2018-09-05 MED ORDER — OMEGA-3-ACID ETHYL ESTERS 1 G PO CAPS
ORAL_CAPSULE | Freq: Two times a day (BID) | ORAL | Status: DC
  Administered 2018-09-05: 1 g via ORAL
  Filled 2018-09-05 (×2): qty 1

## 2018-09-05 MED ORDER — ASPIRIN 81 MG PO CHEW
81.0000 mg | CHEWABLE_TABLET | Freq: Every day | ORAL | Status: DC
  Administered 2018-09-06 – 2018-09-07 (×2): 81 mg via ORAL
  Filled 2018-09-05 (×2): qty 1

## 2018-09-05 MED ORDER — ATORVASTATIN CALCIUM 40 MG PO TABS
40.0000 mg | ORAL_TABLET | Freq: Every day | ORAL | Status: DC
  Administered 2018-09-06 – 2018-09-07 (×2): 40 mg via ORAL
  Filled 2018-09-05 (×2): qty 1

## 2018-09-05 MED ORDER — VITAMIN C 500 MG PO TABS
1000.0000 mg | ORAL_TABLET | Freq: Every day | ORAL | Status: DC
  Administered 2018-09-05 – 2018-09-07 (×3): 1000 mg via ORAL
  Filled 2018-09-05 (×3): qty 2

## 2018-09-05 MED ORDER — HYDROCHLOROTHIAZIDE 12.5 MG PO CAPS
12.5000 mg | ORAL_CAPSULE | Freq: Every day | ORAL | Status: DC
  Administered 2018-09-06: 12.5 mg via ORAL
  Filled 2018-09-05: qty 1

## 2018-09-05 MED ORDER — FOLIC ACID 1 MG PO TABS
1.0000 mg | ORAL_TABLET | Freq: Every day | ORAL | Status: DC
  Administered 2018-09-06 – 2018-09-07 (×2): 1 mg via ORAL
  Filled 2018-09-05 (×2): qty 1

## 2018-09-05 MED ORDER — INSULIN ASPART 100 UNIT/ML ~~LOC~~ SOLN: 0.0000 [IU] | Freq: Every day | SUBCUTANEOUS | Status: DC

## 2018-09-05 MED ORDER — SODIUM CHLORIDE 0.9 % IV BOLUS
500.0000 mL | Freq: Once | INTRAVENOUS | Status: AC
  Administered 2018-09-05: 500 mL via INTRAVENOUS

## 2018-09-05 MED ORDER — OMEGA-3-ACID ETHYL ESTERS 1 G PO CAPS
1.0000 g | ORAL_CAPSULE | Freq: Two times a day (BID) | ORAL | Status: DC
  Administered 2018-09-06 – 2018-09-07 (×3): 1 g via ORAL
  Filled 2018-09-05 (×3): qty 1

## 2018-09-05 MED ORDER — TAMSULOSIN HCL 0.4 MG PO CAPS
0.4000 mg | ORAL_CAPSULE | Freq: Every day | ORAL | Status: DC
  Administered 2018-09-06 – 2018-09-07 (×2): 0.4 mg via ORAL
  Filled 2018-09-05 (×2): qty 1

## 2018-09-05 MED ORDER — LOSARTAN POTASSIUM-HCTZ 50-12.5 MG PO TABS: 1.0000 | ORAL_TABLET | Freq: Every day | ORAL | Status: DC

## 2018-09-05 MED ORDER — ONDANSETRON HCL 4 MG/2ML IJ SOLN: 4.0000 mg | Freq: Four times a day (QID) | INTRAMUSCULAR | Status: DC | PRN

## 2018-09-05 MED ORDER — ENOXAPARIN SODIUM 40 MG/0.4ML ~~LOC~~ SOLN
40.0000 mg | SUBCUTANEOUS | Status: DC
  Administered 2018-09-05 – 2018-09-06 (×2): 40 mg via SUBCUTANEOUS
  Filled 2018-09-05 (×2): qty 0.4

## 2018-09-05 MED ORDER — LEVETIRACETAM 500 MG PO TABS
500.0000 mg | ORAL_TABLET | Freq: Two times a day (BID) | ORAL | Status: DC
  Administered 2018-09-05 – 2018-09-07 (×5): 500 mg via ORAL
  Filled 2018-09-05 (×5): qty 1

## 2018-09-05 MED ORDER — PROSIGHT PO TABS
1.0000 | ORAL_TABLET | Freq: Every day | ORAL | Status: DC
  Administered 2018-09-06 – 2018-09-07 (×2): 1 via ORAL
  Filled 2018-09-05 (×2): qty 1

## 2018-09-05 MED ORDER — INSULIN ASPART 100 UNIT/ML ~~LOC~~ SOLN: 0.0000 [IU] | Freq: Three times a day (TID) | SUBCUTANEOUS | Status: DC

## 2018-09-05 MED ORDER — LOSARTAN POTASSIUM 50 MG PO TABS
50.0000 mg | ORAL_TABLET | Freq: Every day | ORAL | Status: DC
  Filled 2018-09-05: qty 1

## 2018-09-05 NOTE — ED Triage Notes (Signed)
Per EMS: pt coming from church with a witnessed syncopal episode that lasted 2 minutes.  Some witnesses report shaking and possible seizure activity prior to episode.  Pt incontinent of bowel and bladder.  Pt denies any pain.

## 2018-09-05 NOTE — Progress Notes (Signed)
Andres [name] [id number] Admission Data: 09/05/2018 4:12 PM Attending Provider: Merton Border, MD  IEP:PIRJ, Hunt Oris, MD Consults/ Treatment Team:   Andres [name] is a 78 y.o. male patient admitted from ED awake, alert  & orientated  X [name],  Prior, VSS - Blood pressure 127/75, pulse 63, temperature 97.9 F (36.6 C), temperature source Oral, resp. rate 18, height 5\' 5"  (1.651 m), weight 104 kg, SpO2 99 %., O2    Room air, no c/o shortness of breath, no c/o chest pain, no distress noted. Tele # 12 placed and pt is currently running:normal sinus rhythm.   IV site WDL:  antecubital left, condition patent and no redness with a transparent dsg that's clean dry and intact.  Allergies:  No Known Allergies   Past Medical History:  Diagnosis Date  . ALLERGIC RHINITIS   . Benign prostatic hypertrophy   . 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 Last nuc 2012 negative. 07/21/2013  . Carpal tunnel syndrome, bilateral   . Cataracts, bilateral   . Coronary artery disease    Dr. Claiborne Billings; 2D ECHO, 12/11/2011 - EF 50-55%, normal; NUCLEAR STRESS TEST, [name]/14/2012 - no evidemce of inducible ischemia  . Diabetes mellitus type II   . Diverticulosis of colon   . GERD (gastroesophageal reflux disease)   . H/O hiatal hernia   . Head mass   . Hepatitis   . Hx of   sessile serrated colonic polyp 02/27/2015  . Hyperlipidemia   . Hypertension   . Inguinal hernia, Rt reduced, Lt present 07/21/2013  . Low back pain   . Mitral valve prolapse 11/24/2013   By echo dec 2014  . Nephrolithiasis   . Obesity, morbid ([place])   . Occlusion and stenosis of carotid artery without mention of cerebral infarction    CAROTID DOPPLER, 03/18/2012 - srable, mild, hard, plaque noted, bilaterally  . Stroke [facility])     History:  obtained from the patient.   Pt orientation to unit, room and routine. Information packet given to patient/family and safety video watched.  Admission INP armband ID verified with patient/family, and in  place. SR up x 2, fall risk assessment complete with Patient and family verbalizing understanding of risks associated with falls. Pt verbalizes an understanding of how to use the call bell and to call for help before getting out of bed.  Skin, clean-dry- intact without evidence of bruising, or skin tears.   No evidence of skin break down noted on exam. no rashes, no ecchymoses, no petechiae, no nodules, no jaundice, no purpura, no wounds, no acanthosis nigricans, no striae    Will cont to monitor and assist as needed.  Tresa Endo, RN 09/05/2018 4:12 PM

## 2018-09-05 NOTE — ED Notes (Signed)
Pt had large incontinent bowl movement. Pt was able to stand and to be cleaned. Pt's closed placed in belongings bag and placed in a new clean gown with need sheets on the bed.

## 2018-09-05 NOTE — ED Provider Notes (Signed)
[name] EMERGENCY DEPARTMENT Provider Note   CSN: [id number] Arrival date & time: 09/05/18  1012     History   Chief Complaint Chief Complaint  Patient presents with  . Loss of Consciousness  . Seizures    HPI Andres [name] is a 78 y.o. male.  78 year old male with prior medical history as detailed below presents for evaluation following reported syncopal event.  Patient was at church.  Patient was sitting down in his pew.  Patient was noted to be briefly unresponsive.  Episode lasted approximately 3 to 5 minutes.  Bystanders did notice some "twitching of his arms."  Patient was incontinent of stool.  Patient was not postictal following this episode.  EMS reports that he was at his mental status baseline upon their arrival.  Patient was noted to be mildly hypotensive with a systolic in the 65H upon their initial evaluation.  Upon arrival to the ED patient is without specific complaint.  He is amnestic to the event.  He denies associated chest pain, shortness of breath, nausea, vomiting, fever, or other complaint.  Patient reportedly had a similar episode approximately 3 weeks prior.  He did not seek treatment at that time.   The history is provided by the patient and medical records.  Loss of Consciousness  Episode history:  Single Most recent episode:  Today Duration:  5 minutes Timing:  Rare Progression:  Resolved Chronicity:  New Witnessed: yes   Relieved by:  Nothing Worsened by:  Nothing Ineffective treatments:  None tried   Past Medical History:  Diagnosis Date  . ALLERGIC RHINITIS   . Benign prostatic hypertrophy   . 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 Last nuc 2012 negative. 07/21/2013  . Carpal tunnel syndrome, bilateral   . Cataracts, bilateral   . Coronary artery disease    Dr. Claiborne Billings; 2D ECHO, 12/11/2011 - EF 50-55%, normal; NUCLEAR STRESS TEST, 10/16/2010 - no evidemce of inducible ischemia  . Diabetes mellitus type II     . Diverticulosis of colon   . GERD (gastroesophageal reflux disease)   . H/O hiatal hernia   . Head mass   . Hepatitis   . Hx of   sessile serrated colonic polyp 02/27/2015  . Hyperlipidemia   . Hypertension   . Inguinal hernia, Rt reduced, Lt present 07/21/2013  . Low back pain   . Mitral valve prolapse 11/24/2013   By echo dec 2014  . Nephrolithiasis   . Obesity, morbid ([place])   . Occlusion and stenosis of carotid artery without mention of cerebral infarction    CAROTID DOPPLER, 03/18/2012 - srable, mild, hard, plaque noted, bilaterally  . Stroke [facility])     Patient Active Problem List   Diagnosis Date Noted  . Gout 07/10/2018  . Scrotal swelling 06/09/2018  . Urinary dribbling 06/03/2017  . Mass of scrotum 06/03/2017  . Injury of left hand 07/21/2015  . Hx of   sessile serrated colonic polyp 02/27/2015  . Chronic anticoagulation 12/22/2014  . Colon cancer screening 12/22/2014  . Morbid obesity ([place]) 09/29/2014  . Mitral valve prolapse 11/24/2013  . IT band syndrome 09/23/2013  . Hypotension, unspecified 07/21/2013  . Dyspnea 07/21/2013  . Elevated troponin I level- (pk Troponin 0.34 in setting of acute renal insufficency) 07/21/2013  . 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 Last nuc 2012 negative. 07/21/2013  . SBO (small bowel obstruction) ([place]) 07/21/2013  . Inguinal hernia, Rt reduced, Lt present  07/21/2013  . Acute renal failure ([place]) 07/21/2013  . Pneumonia, aspiration ([place]) 07/21/2013  . Obesity (BMI 30-39.9) 01/17/2013  . Osteoarthritis, hand, primary localized 08/12/2012  . Preventative health care 04/11/2011  . MILD COGNITIVE IMPAIRMENT SO STATED 10/03/2010  . CEREBROVASCULAR ACCIDENT, HX OF 10/03/2010  . RASH-NONVESICULAR 05/12/2008  . TRANSIENT ISCHEMIC ATTACK 01/27/2008  . Diabetes ([place]) 12/08/2007  . ALLERGIC RHINITIS 12/08/2007  . DIVERTICULOSIS, COLON 12/08/2007  . LOW BACK PAIN 12/08/2007  . NEPHROLITHIASIS, HX OF 12/08/2007  . BENIGN  PROSTATIC HYPERTROPHY 04/05/2007  . Hyperlipidemia 04/02/2007  . CARPAL TUNNEL SYNDROME, BILATERAL 04/02/2007  . CATARACT NOS 04/02/2007  . Essential hypertension 04/02/2007    Past Surgical History:  Procedure Laterality Date  . APPENDECTOMY    . CARDIAC CATHETERIZATION    . CATARACT EXTRACTION Right   . CORONARY ARTERY BYPASS GRAFT    . INGUINAL HERNIA REPAIR Right 07/26/2013   Procedure: REPAIR  INCARCERATED RIGHT INGUINAL  HERNIA ;  Surgeon: Gwenyth Ober, MD;  Location: [place];  Service: General;  Laterality: Right;        Home Medications    Prior to Admission medications   Medication Sig Start Date End Date Taking? Authorizing Provider  Acetaminophen 500 MG coapsule Take 500 mg by mouth every 6 (six) hours as needed for mild pain. Reported on 07/21/2015   Yes [provider]  Ascorbic Acid (VITAMIN C) 1000 MG tablet Take 1,000 mg by mouth daily.   Yes [provider]  Aspirin (ADULT ASPIRIN LOW STRENGTH) 81 MG EC tablet Take 81 mg by mouth daily.     Yes [provider]  atorvastatin (LIPITOR) 40 MG tablet Take 1 tablet (40 mg total) by mouth daily. 12/02/17  Yes [name], MD  clopidogrel (PLAVIX) 75 MG tablet Take 1 tablet (75 mg total) by mouth daily. 12/02/17  Yes [name], MD  diclofenac sodium (VOLTAREN) 1 % GEL Apply 2 g topically every 6 (six) hours as needed (for arthritis). 12/02/17  Yes [name], MD  folic acid (FOLVITE) 1 MG tablet Take 1 tablet (1 mg total) by mouth daily. 12/02/17  Yes [name], MD  losartan-hydrochlorothiazide (HYZAAR) 50-12.5 MG tablet Take 1 tablet by mouth daily. 12/02/17  Yes [name], MD  metoprolol tartrate (LOPRESSOR) 50 MG tablet Take 1 tablet (50 mg total) by mouth 2 (two) times daily. 12/02/17  Yes [name], MD  Multiple Vitamin (MULTIVITAMIN) capsule Take 1 capsule by mouth daily.    Yes [provider]  Omega-3 Fatty Acids (OMEGA 3 PO) Take 360 mg by mouth 2 (two) times daily.    Yes [provider]  tamsulosin (FLOMAX) 0.4 MG CAPS capsule Take 1 capsule (0.4 mg total) by mouth daily. 12/02/17  Yes [name], MD  indomethacin (INDOCIN) 50 MG capsule Take 1 capsule (50 mg total) by mouth 3 (three) times daily with meals. Patient not taking: Reported on 09/05/2018 07/10/18   Luetta Nutting, DO    Family History Family History  Problem Relation Age of Onset  . Heart attack Mother   . Diabetes Mother   . Heart attack Brother   . Heart attack Maternal Grandmother   . Hypertension Maternal Grandmother   . Pancreatic cancer Brother   . Kidney disease Brother   . Hypertension Son   . Kidney disease Son        ?  Marland Kitchen Alzheimer's disease Father   . Diabetes Brother   .  Diabetes Sister     Social History Social History   Tobacco Use  . Smoking status: Former Smoker    Types: Cigarettes    Last attempt to quit: 12/20/1989    Years since quitting: 28.7  . Smokeless tobacco: Never Used  Substance Use Topics  . Alcohol use: No    Alcohol/week: 0.0 standard drinks  . Drug use: No     Allergies   Patient has no known allergies.   Review of Systems Review of Systems  Cardiovascular: Positive for syncope.  All other systems reviewed and are negative.    Physical Exam Updated Vital Signs BP (!) 121/98   Pulse 61   Temp (!) 97.5 F (36.4 C) (Oral)   Resp 13   Ht 5' 5.5" (1.664 m)   Wt 104.8 kg   SpO2 100%   BMI 37.86 kg/m   Physical Exam Vitals signs and nursing note reviewed.  Constitutional:      General: He is not in acute distress.    Appearance: He is well-developed.  HENT:     Head: Normocephalic and atraumatic.  Eyes:     Conjunctiva/sclera: Conjunctivae normal.     Pupils: Pupils are equal, round, and reactive to light.  Neck:     Musculoskeletal: Normal range of motion and neck supple.  Cardiovascular:     Rate and Rhythm: Normal rate and regular rhythm.     Heart sounds: Normal heart sounds.  Pulmonary:     Effort:  Pulmonary effort is normal. No respiratory distress.     Breath sounds: Normal breath sounds.  Abdominal:     General: There is no distension.     Palpations: Abdomen is soft.     Tenderness: There is no abdominal tenderness.  Genitourinary:    Comments: Incontinent of stool Musculoskeletal: Normal range of motion.        General: No deformity.  Skin:    General: Skin is warm and dry.  Neurological:     Mental Status: He is alert and oriented to person, place, and time.      ED Treatments / Results  Labs (all labs ordered are listed, but only abnormal results are displayed) Labs Reviewed  COMPREHENSIVE METABOLIC PANEL - Abnormal; Notable for the following components:      Result Value   Glucose, Bld 102 (*)    BUN 29 (*)    Creatinine, Ser 1.98 (*)    GFR calc non Af Amer 31 (*)    GFR calc Af Amer 36 (*)    All other components within normal limits  CBC WITH DIFFERENTIAL/PLATELET - Abnormal; Notable for the following components:   RBC 3.93 (*)    Hemoglobin 12.4 (*)    All other components within normal limits  URINALYSIS, ROUTINE W REFLEX MICROSCOPIC - Abnormal; Notable for the following components:   APPearance HAZY (*)    All other components within normal limits  PROTIME-INR  ETHANOL  LACTIC ACID, PLASMA  RAPID URINE DRUG SCREEN, HOSP PERFORMED  LACTIC ACID, PLASMA  CBG MONITORING, ED  I-STAT TROPONIN, ED  TYPE AND SCREEN    EKG EKG Interpretation  Date/Time:  Sunday September 05 2018 10:23:01 EST Ventricular Rate:  64 PR Interval:    QRS Duration: 121 QT Interval:  411 QTC Calculation: 424 R Axis:   -28 Text Interpretation:  Sinus rhythm Probable left atrial enlargement Nonspecific intraventricular conduction delay Borderline T abnormalities, diffuse leads Minimal ST elevation, anterior leads Confirmed by Dene Gentry [phone])  on 09/05/2018 10:27:04 AM   Radiology Ct Head Wo Contrast  Result Date: 09/05/2018 CLINICAL DATA:  a witnessed syncopal  episode that lasted 2 minutes. Some witnesses report shaking and possible seizure activity prior to episode. EXAM: CT HEAD WITHOUT CONTRAST TECHNIQUE: Contiguous axial images were obtained from the base of the skull through the vertex without intravenous contrast. COMPARISON:  Brain MRI, 02/07/2008 FINDINGS: Brain: No evidence of acute infarction, hemorrhage, hydrocephalus, extra-axial collection or mass lesion/mass effect. Old right posterior MCA distribution infarct, which was present on the prior brain MRI. Mild bilateral periventricular white matter hypoattenuation consistent with chronic microvascular ischemic change. Vascular: No hyperdense vessel or unexpected calcification. Skull: Normal. Negative for fracture or focal lesion. Sinuses/Orbits: Globes and orbits are unremarkable. Sinuses and mastoid air cells are clear. Other: None. IMPRESSION: 1. No acute intracranial abnormalities. 2. 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 Mild chronic microvascular ischemic change. Electronically Signed   By: Lajean Manes M.D.   On: 09/05/2018 11:15   Dg Chest Port 1 View  Result Date: 09/05/2018 CLINICAL DATA:  Per EMS: pt coming from church with a witnessed syncopal episode that lasted 2 minutes. Some witnesses report shaking and possible seizure activity prior to episode. EXAM: PORTABLE CHEST 1 VIEW COMPARISON:  07/31/2013 FINDINGS: Stable changes from prior CABG surgery. Cardiac silhouette is normal in size and configuration. No mediastinal or hilar masses. No evidence of adenopathy. Clear lungs.  No pleural effusion or pneumothorax. Skeletal structures are grossly intact. IMPRESSION: No acute cardiopulmonary disease. Electronically Signed   By: Lajean Manes M.D.   On: 09/05/2018 10:53    Procedures Procedures (including critical care time)  Medications Ordered in ED Medications  sodium chloride 0.9 % bolus 500 mL (500 mLs Intravenous New Bag/Given 09/05/18 1212)  sodium chloride 0.9 %  bolus 500 mL (0 mLs Intravenous Stopped 09/05/18 1212)     Initial Impression / Assessment and Plan / ED Course  I have reviewed the triage vital signs and the nursing notes.  Pertinent labs & imaging results that were available during my care of the patient were reviewed by me and considered in my medical decision making (see chart for details).     MDM  Screen complete  Patient is presenting for evaluation following reported syncopal event.  Presentation today is not entirely consistent with possible seizure.  Screening labs do suggest mild bump in BUN and creatinine consistent with possible dehydration.  Patient did have mild hypotension upon initial EMS evaluation.  This is improved with IV fluids.  Given patient's presentation and comorbidities I feel that it would be appropriate to admit this patient for observation and further work-up and treatment.  Medicine service is aware case will evaluate for admission.  Final Clinical Impressions(s) / ED Diagnoses   Final diagnoses:  Syncope and collapse    ED Discharge Orders    None       Valarie Merino, MD 09/05/18 1255

## 2018-09-05 NOTE — Consult Note (Addendum)
NEURO HOSPITALIST CONSULT NOTE   Requestig physician: Dr. Laren Everts  Reason for Consult:LOC/ possible seizure  History obtained from:  Patient   / friend  HPI:                                                                                                                                          Andres [name] is an 78 y.o. 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   Patient was at church today when he had a syncopal episode. Per patient he never lost consciousness. Per church member there was a brief loss of consciousness for about 2 minutes. There was also some arm shaking that occurred. Patient was also incontinent of bowel and bladder. Patient reports that about 3 weeks ago he had a presyncopal episode where he stood up too fast and became dizzy. Patient also states that years ago ( maybe 4-5) he was in a meeting and had " a seizure"-the speaker in the meeting actually noticed that he was staring off and he does not remember that episode.  Denies any jerking movements at that time .  States he did not seek treatment at that time.  Denies any o aura.  Denies any headaches.  Denies visual symptoms.  Denies focal tingling numbness or weakness.  Denies chest pain shortness of breath.  Denies nausea vomiting.  Denies abdominal pain.  Denies preceding fevers chills.  Denies preceding flulike symptoms.  ED course: CTH:no hemorrhage; old right posterior MCA territory infarct. BP:122/79 BG: 102 Creatinine: 1.98 BUN:29  Past Medical History:  Diagnosis Date  . ALLERGIC RHINITIS   . Benign prostatic hypertrophy   . 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 Last nuc 2012 negative. 07/21/2013  . Carpal tunnel syndrome, bilateral   . Cataracts, bilateral   . Coronary artery disease    Dr. Claiborne Billings; 2D ECHO, 12/11/2011 - EF 50-55%, normal; NUCLEAR STRESS TEST,  10/16/2010 - no evidemce of inducible ischemia  . Diabetes mellitus type II   . Diverticulosis of colon   . GERD (gastroesophageal reflux disease)   . H/O hiatal hernia   . Head mass   . Hepatitis   . Hx of   sessile serrated colonic polyp 02/27/2015  . Hyperlipidemia   . Hypertension   . Inguinal hernia, Rt reduced, Lt present 07/21/2013  . Low back pain   . Mitral valve prolapse 11/24/2013   By echo dec 2014  . Nephrolithiasis   . Obesity, morbid ([place])   . Occlusion and stenosis of carotid artery without mention of cerebral infarction    CAROTID DOPPLER, 03/18/2012 - srable, mild, hard, plaque  noted, bilaterally  . Stroke [facility])     Past Surgical History:  Procedure Laterality Date  . APPENDECTOMY    . CARDIAC CATHETERIZATION    . CATARACT EXTRACTION Right   . CORONARY ARTERY BYPASS GRAFT    . INGUINAL HERNIA REPAIR Right 07/26/2013   Procedure: REPAIR  INCARCERATED RIGHT INGUINAL  HERNIA ;  Surgeon: Gwenyth Ober, MD;  Location: [place];  Service: General;  Laterality: Right;    Family History  Problem Relation Age of Onset  . Heart attack Mother   . Diabetes Mother   . Heart attack Brother   . Heart attack Maternal Grandmother   . Hypertension Maternal Grandmother   . Pancreatic cancer Brother   . Kidney disease Brother   . Hypertension Son   . Kidney disease Son        ?  Marland Kitchen Alzheimer's disease Father   . Diabetes Brother   . Diabetes Sister            Social History:  reports that he quit smoking about 28 years ago. His smoking use included cigarettes. He has never used smokeless tobacco. He reports that he does not drink alcohol or use drugs.  No Known Allergies  MEDICATIONS:                                                                                                                     Current Facility-Administered Medications  Medication Dose Route Frequency Provider Last Rate Last Dose  . levETIRAcetam (KEPPRA) tablet 500 mg  500 mg Oral BID Vonzella Nipple, NP       Current Outpatient Medications  Medication Sig Dispense Refill  . Acetaminophen 500 MG coapsule Take 500 mg by mouth every 6 (six) hours as needed for mild pain. Reported on 07/21/2015    . Ascorbic Acid (VITAMIN C) 1000 MG tablet Take 1,000 mg by mouth daily.    . Aspirin (ADULT ASPIRIN LOW STRENGTH) 81 MG EC tablet Take 81 mg by mouth daily.      Marland Kitchen atorvastatin (LIPITOR) 40 MG tablet Take 1 tablet (40 mg total) by mouth daily. 90 tablet 3  . clopidogrel (PLAVIX) 75 MG tablet Take 1 tablet (75 mg total) by mouth daily. 90 tablet 3  . diclofenac sodium (VOLTAREN) 1 % GEL Apply 2 g topically every 6 (six) hours as needed (for arthritis). 161 g 3  . folic acid (FOLVITE) 1 MG tablet Take 1 tablet (1 mg total) by mouth daily. 90 tablet 3  . losartan-hydrochlorothiazide (HYZAAR) 50-12.5 MG tablet Take 1 tablet by mouth daily. 90 tablet 3  . metoprolol tartrate (LOPRESSOR) 50 MG tablet Take 1 tablet (50 mg total) by mouth 2 (two) times daily. 180 tablet 3  . Multiple Vitamin (MULTIVITAMIN) capsule Take 1 capsule by mouth daily.     . Omega-3 Fatty Acids (OMEGA 3 PO) Take 360 mg by mouth 2 (two) times daily.    . tamsulosin (FLOMAX) 0.4 MG CAPS capsule Take 1  capsule (0.4 mg total) by mouth daily. 90 capsule 3  . indomethacin (INDOCIN) 50 MG capsule Take 1 capsule (50 mg total) by mouth 3 (three) times daily with meals. (Patient not taking: Reported on 09/05/2018) 45 capsule 0   ROS:                                                                                                                                       ROS was performed and is negative except as noted in HPI  Blood pressure 125/78, pulse 61, temperature (!) 97.5 F (36.4 C), temperature source Oral, resp. rate 18, height 5' 5.5" (1.664 m), weight 104.8 kg, SpO2 100 %.  General Examination:                                                                                                      Physical Exam  HEENT-   Normocephalic, no lesions, without obvious abnormality.  Normal external eye and conjunctiva.   Cardiovascular- S1-S2 audible, pulses palpable throughout   Lungs-no rhonchi or wheezing noted, no excessive working breathing.  Saturations within normal limits on RA Abdomen- All 4 quadrants palpated and nontender Extremities- Warm, dry and intact Musculoskeletal-no joint tenderness, deformity or swelling Skin-warm and excessively dry feet, no hyperpigmentation, vitiligo, or suspicious lesions  Neurological Examination Mental Status: Alert, oriented, thought content appropriate.  Speech fluent without evidence of aphasia.  Able to follow commands without difficulty. Cranial Nerves: II:  Visual fields grossly normal,  III,IV, VI: ptosis not present, extra-ocular motions intact bilaterally pupils equal, round, reactive to light and accommodation V,VII: smile symmetric, facial light touch sensation normal bilaterally VIII: hearing normal bilaterally IX,X: uvula rises symmetrically XI: bilateral shoulder shrug XII: midline tongue extension Motor: Right : Upper extremity   5/5  Left:     Upper extremity   5/5  Lower extremity   5/5   Lower extremity   5/5 Tone and bulk:normal tone throughout; no atrophy noted Sensory: light touch intact throughout, bilaterally Deep Tendon Reflexes: 2+ and symmetric biceps, 3+ knee jerk Plantars: Right: downgoing   Left: downgoing Cerebellar: normal finger-to-nose,normal heel-to-shin test Gait: deferred   Lab Results: Basic Metabolic Panel: Recent Labs  Lab 09/05/18 1055  NA 141  K 4.4  CL 104  CO2 26  GLUCOSE 102*  BUN 29*  CREATININE 1.98*  CALCIUM 9.5    CBC: Recent Labs  Lab 09/05/18 1055  WBC 4.8  NEUTROABS 2.8  HGB 12.4*  HCT 39.0  MCV  99.2  PLT 175   Imaging: Ct Head Wo Contrast  Result Date: 09/05/2018 CLINICAL DATA:  a witnessed syncopal episode that lasted 2 minutes. Some witnesses report shaking and possible seizure  activity prior to episode. EXAM: CT HEAD WITHOUT CONTRAST TECHNIQUE: Contiguous axial images were obtained from the base of the skull through the vertex without intravenous contrast. COMPARISON:  Brain MRI, 02/07/2008 FINDINGS: Brain: No evidence of acute infarction, hemorrhage, hydrocephalus, extra-axial collection or mass lesion/mass effect. Old right posterior MCA distribution infarct, which was present on the prior brain MRI. Mild bilateral periventricular white matter hypoattenuation consistent with chronic microvascular ischemic change. Vascular: No hyperdense vessel or unexpected calcification. Skull: Normal. Negative for fracture or focal lesion. Sinuses/Orbits: Globes and orbits are unremarkable. Sinuses and mastoid air cells are clear. Other: None. IMPRESSION: 1. No acute intracranial abnormalities. 2. 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 Mild chronic microvascular ischemic change. Electronically Signed   By: Lajean Manes M.D.   On: 09/05/2018 11:15   Dg Chest Port 1 View  Result Date: 09/05/2018 CLINICAL DATA:  Per EMS: pt coming from church with a witnessed syncopal episode that lasted 2 minutes. Some witnesses report shaking and possible seizure activity prior to episode. EXAM: PORTABLE CHEST 1 VIEW COMPARISON:  07/31/2013 FINDINGS: Stable changes from prior CABG surgery. Cardiac silhouette is normal in size and configuration. No mediastinal or hilar masses. No evidence of adenopathy. Clear lungs.  No pleural effusion or pneumothorax. Skeletal structures are grossly intact. IMPRESSION: No acute cardiopulmonary disease. Electronically Signed   By: Lajean Manes M.D.   On: 09/05/2018 10:53    Assessment: 78 year old man with PMH, Stroke with no residual deficits, HTN, HLD, DM, CAD ( s/p CABG) who presented to [facility] ED with c/o LOC and seizure like activity.  CTH: no hemorrhage; old right MCA territory infarct.  Given presentation of stereotypic events and history similar  events in the past as well as CT head with old right MCA cortical infarct, suspicion is high for seizure. Will start on keppra 500 mg BID.  Also evaluate for any evidence of new stroke given history of strokes.  Impression: Evaluate for seizure Evaluate for syncope Evaluate for any new stroke.  Recommendations: --Routine EEG   --MRI brain without contrast.  He has deranged renal function. -start keppra 500 mg BID.  No need to load. --seizure precautions --f/u outpatient neurology after discharge --Syncope work-up per primary team --Maintain on telemetry.  Consider echocardiogram.  Instructions given to patient and patient verbalized an understanding: Per [facility] statutes, patients with seizures are not allowed to drive until  they have been seizure-free for six months. Use caution when using heavy equipment or power tools. Avoid working on ladders or at heights. Take showers instead of baths. Ensure the water temperature is not too high on the home water heater. Do not go swimming alone. When caring for infants or small children, sit down when holding, feeding, or changing them to minimize risk of injury to the child in the event you have a seizure.   Also, Maintain good sleep hygiene. Avoid alcohol.   Laurey Morale, MSN, NP-C [facility] [phone]  Attending neurologist's note to follow   Attending Neurohospitalist Addendum Patient seen and examined with APP/Resident. Agree with the history and physical as documented above. Agree with the plan as documented, which I helped formulate. I have independently reviewed the chart, obtained history, review of systems and examined the patient.I have personally reviewed pertinent head/neck/spine imaging (  CT/MRI). Please feel free to call with any questions. --- Amie Portland, MD [facility] Pager: [phone]  If 7pm to 7am, please call on call as listed on AMION.

## 2018-09-05 NOTE — H&P (Addendum)
[facility]                                                                                    Patient Demographics  Andres [name], is a 78 y.o. male  CSN: [id number]  MRN: [id number]  DOB - 03-Oct-1940  Admit Date - 09/05/2018  Outpatient Primary MD for the patient is [name], MD   With History of -  Past Medical History:  Diagnosis Date  . ALLERGIC RHINITIS   . Benign prostatic hypertrophy   . 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 Last nuc 2012 negative. 07/21/2013  . Carpal tunnel syndrome, bilateral   . Cataracts, bilateral   . Coronary artery disease    Dr. Claiborne Billings; 2D ECHO, 12/11/2011 - EF 50-55%, normal; NUCLEAR STRESS TEST, 10/16/2010 - no evidemce of inducible ischemia  . Diabetes mellitus type II   . Diverticulosis of colon   . GERD (gastroesophageal reflux disease)   . H/O hiatal hernia   . Head mass   . Hepatitis   . Hx of   sessile serrated colonic polyp 02/27/2015  . Hyperlipidemia   . Hypertension   . Inguinal hernia, Rt reduced, Lt present 07/21/2013  . Low back pain   . Mitral valve prolapse 11/24/2013   By echo dec 2014  . Nephrolithiasis   . Obesity, morbid ([place])   . Occlusion and stenosis of carotid artery without mention of cerebral infarction    CAROTID DOPPLER, 03/18/2012 - srable, mild, hard, plaque noted, bilaterally  . Stroke [facility])       Past Surgical History:  Procedure Laterality Date  . APPENDECTOMY    . CARDIAC CATHETERIZATION    . CATARACT EXTRACTION Right   . CORONARY ARTERY BYPASS GRAFT    . INGUINAL HERNIA REPAIR Right 07/26/2013   Procedure: REPAIR  INCARCERATED RIGHT INGUINAL  HERNIA ;  Surgeon: Gwenyth Ober, MD;  Location: [place];  Service: General;  Laterality: Right;    in for   Chief Complaint  Patient presents with  . Loss of Consciousness  . Seizures     HPI  Andres [name]  is a 78 y.o. male, with past medical history significant for coronary artery disease status post CABG in the past  and  profound old CVA presented today with a near syncopal episode that occurred while at church.  Patient the does not reports a loss of consciousness.  The episode was witnessed with possible seizure activity with incontinence of bowel and bladder.  No aura or postictal symptoms.  Patient had a similar episode 3 weeks ago but he did not seek any medical attention.  He had another episode around 5 years ago as well.  No family history of seizures. Discussed with neurology Dr. Malen Gauze in the emergency room.  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  Patient was also noted to be in chronic renal failure, worsening    Review of Systems    In addition to the HPI above,  No Fever-chills, No Headache, No changes with Vision or hearing, No problems swallowing food or Liquids, No Chest pain, Cough or  Shortness of Breath, No Abdominal pain, No Nausea or Vommitting, Bowel movements are regular, No Blood in stool or Urine, No dysuria, No new skin rashes or bruises, No new joints pains-aches,  No new weakness, tingling, numbness in any extremity, No recent weight gain or loss, No polyuria, polydypsia or polyphagia, No significant Mental Stressors.  A full 10 point Review of Systems was done, except as stated above, all other Review of Systems were negative.   Social History Social History   Tobacco Use  . Smoking status: Former Smoker    Types: Cigarettes    Last attempt to quit: 12/20/1989    Years since quitting: 28.7  . Smokeless tobacco: Never Used  Substance Use Topics  . Alcohol use: No    Alcohol/week: 0.0 standard drinks     Family History Family History  Problem Relation Age of Onset  . Heart attack Mother   . Diabetes Mother   . Heart attack Brother   . Heart attack Maternal Grandmother   . Hypertension Maternal Grandmother   . Pancreatic cancer Brother   . Kidney disease Brother   . Hypertension Son   . Kidney disease Son         ?  Marland Kitchen Alzheimer's disease Father   . Diabetes Brother   . Diabetes Sister      Prior to Admission medications   Medication Sig Start Date End Date Taking? Authorizing Provider  Acetaminophen 500 MG coapsule Take 500 mg by mouth every 6 (six) hours as needed for mild pain. Reported on 07/21/2015   Yes [provider]  Ascorbic Acid (VITAMIN C) 1000 MG tablet Take 1,000 mg by mouth daily.   Yes [provider]  Aspirin (ADULT ASPIRIN LOW STRENGTH) 81 MG EC tablet Take 81 mg by mouth daily.     Yes [provider]  atorvastatin (LIPITOR) 40 MG tablet Take 1 tablet (40 mg total) by mouth daily. 12/02/17  Yes [name], MD  clopidogrel (PLAVIX) 75 MG tablet Take 1 tablet (75 mg total) by mouth daily. 12/02/17  Yes [name], MD  diclofenac sodium (VOLTAREN) 1 % GEL Apply 2 g topically every 6 (six) hours as needed (for arthritis). 12/02/17  Yes [name], MD  folic acid (FOLVITE) 1 MG tablet Take 1 tablet (1 mg total) by mouth daily. 12/02/17  Yes [name], MD  losartan-hydrochlorothiazide (HYZAAR) 50-12.5 MG tablet Take 1 tablet by mouth daily. 12/02/17  Yes [name], MD  metoprolol tartrate (LOPRESSOR) 50 MG tablet Take 1 tablet (50 mg total) by mouth 2 (two) times daily. 12/02/17  Yes [name], MD  Multiple Vitamin (MULTIVITAMIN) capsule Take 1 capsule by mouth daily.    Yes [provider]  Omega-3 Fatty Acids (OMEGA 3 PO) Take 360 mg by mouth 2 (two) times daily.   Yes [provider]  tamsulosin (FLOMAX) 0.4 MG CAPS capsule Take 1 capsule (0.4 mg total) by mouth daily. 12/02/17  Yes [name], MD  indomethacin (INDOCIN) 50 MG capsule Take 1 capsule (50 mg total) by mouth 3 (three) times daily with meals. Patient not taking: Reported on 09/05/2018 07/10/18   Luetta Nutting, DO    No Known Allergies  Physical Exam  Vitals  Blood pressure (!) 121/98, pulse 61, temperature (!) 97.5 F (36.4 C), temperature source Oral, resp. rate  13, height 5' 5.5" (1.664 m), weight 104.8 kg, SpO2 100 %.   1. General elderly male, very pleasant,  no acute distress  2.  Flat affect and insight, Not Suicidal or Homicidal, Awake Alert, Oriented X 3.  3. No F.N deficits, grossly, patient moving all extremities.  4. Ears and Eyes appear Normal, Conjunctivae clear, PERRLA. Moist Oral Mucosa.  5. Supple Neck, No JVD, No cervical lymphadenopathy appriciated, No Carotid Bruits.  6. Symmetrical Chest wall movement, Good air movement bilaterally, CTAB.  7. RRR, No Gallops, Rubs or Murmurs, No Parasternal Heave.  8. Positive Bowel Sounds, Abdomen Soft, Non tender, No organomegaly appriciated,No rebound -guarding or rigidity.  9.  No Cyanosis, Normal Skin Turgor, No Skin Rash or Bruise.  10. Good muscle tone,  joints appear normal , no effusions, Normal ROM.    Data Review  CBC Recent Labs  Lab 09/05/18 1055  WBC 4.8  HGB 12.4*  HCT 39.0  PLT 175  MCV 99.2  MCH 31.6  MCHC 31.8  RDW 12.8  LYMPHSABS 1.4  MONOABS 0.6  EOSABS 0.0  BASOSABS 0.0   ------------------------------------------------------------------------------------------------------------------  Chemistries  Recent Labs  Lab 09/05/18 1055  NA 141  K 4.4  CL 104  CO2 26  GLUCOSE 102*  BUN 29*  CREATININE 1.98*  CALCIUM 9.5  AST 33  ALT 20  ALKPHOS 56  BILITOT 0.9   ------------------------------------------------------------------------------------------------------------------ estimated creatinine clearance is 34 mL/min (A) (by C-G formula based on SCr of 1.98 mg/dL (H)). ------------------------------------------------------------------------------------------------------------------ No results for input(s): TSH, T4TOTAL, T3FREE, THYROIDAB in the last 72 hours.  Invalid input(s): FREET3   Coagulation profile Recent Labs  Lab 09/05/18 1055  INR 0.97    ------------------------------------------------------------------------------------------------------------------- No results for input(s): DDIMER in the last 72 hours. -------------------------------------------------------------------------------------------------------------------  Cardiac Enzymes No results for input(s): CKMB, TROPONINI, MYOGLOBIN in the last 168 hours.  Invalid input(s): CK ------------------------------------------------------------------------------------------------------------------ Invalid input(s): POCBNP   ---------------------------------------------------------------------------------------------------------------  Urinalysis    Component Value Date/Time   COLORURINE YELLOW 09/05/2018 1213   APPEARANCEUR HAZY (A) 09/05/2018 1213   LABSPEC 1.013 09/05/2018 1213   PHURINE 5.0 09/05/2018 1213   GLUCOSEU NEGATIVE 09/05/2018 1213   GLUCOSEU NEGATIVE 12/02/2017 1121   HGBUR NEGATIVE 09/05/2018 1213   BILIRUBINUR NEGATIVE 09/05/2018 1213   KETONESUR NEGATIVE 09/05/2018 1213   PROTEINUR NEGATIVE 09/05/2018 1213   UROBILINOGEN 0.2 12/02/2017 1121   NITRITE NEGATIVE 09/05/2018 1213   LEUKOCYTESUR NEGATIVE 09/05/2018 1213    ----------------------------------------------------------------------------------------------------------------   Imaging results:   Ct Head Wo Contrast  Result Date: 09/05/2018 CLINICAL DATA:  a witnessed syncopal episode that lasted 2 minutes. Some witnesses report shaking and possible seizure activity prior to episode. EXAM: CT HEAD WITHOUT CONTRAST TECHNIQUE: Contiguous axial images were obtained from the base of the skull through the vertex without intravenous contrast. COMPARISON:  Brain MRI, 02/07/2008 FINDINGS: Brain: No evidence of acute infarction, hemorrhage, hydrocephalus, extra-axial collection or mass lesion/mass effect. Old right posterior MCA distribution infarct, which was present on the prior brain MRI. Mild  bilateral periventricular white matter hypoattenuation consistent with chronic microvascular ischemic change. Vascular: No hyperdense vessel or unexpected calcification. Skull: Normal. Negative for fracture or focal lesion. Sinuses/Orbits: Globes and orbits are unremarkable. Sinuses and mastoid air cells are clear. Other: None. IMPRESSION: 1. No acute intracranial abnormalities. 2. 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 Mild chronic microvascular ischemic change. Electronically Signed   By: Lajean Manes M.D.   On: 09/05/2018 11:15   Dg Chest Port 1 View  Result Date: 09/05/2018 CLINICAL DATA:  Per EMS: pt coming from church with a witnessed syncopal episode that lasted 2 minutes. Some  witnesses report shaking and possible seizure activity prior to episode. EXAM: PORTABLE CHEST 1 VIEW COMPARISON:  07/31/2013 FINDINGS: Stable changes from prior CABG surgery. Cardiac silhouette is normal in size and configuration. No mediastinal or hilar masses. No evidence of adenopathy. Clear lungs.  No pleural effusion or pneumothorax. Skeletal structures are grossly intact. IMPRESSION: No acute cardiopulmonary disease. Electronically Signed   By: Lajean Manes M.D.   On: 09/05/2018 10:53    My personal review of EKG: Rhythm NSR, at 64 bpm with minimal ST elevations in anterior leads and nonspecific T wave changes    Assessment & Plan  Near syncope, suspect seizures although no aura or postictal symptoms noted. 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  Discussed with Dr. Malen Gauze at length.  Monitor neurochecks Check EEG Serial troponins  History of right MCA CVA in 2009 , profound Continue with aspirin and Lipitor  History of coronary artery disease status post CABG in 19976 Continue with aspirin and Lipitor Abnormal EKG with ST elevations in anterior leads, follow troponins  Acute on chronic renal insufficiency  Beatties mellitus type II;  ISS  Hypertension continue with Lopressor and Hyzaar  Urinary retention continue with Flomax      DVT Prophylaxis Lovenox  AM Labs Ordered, also please review Full Orders  Code Status full  Disposition Plan: Home  Time spent in minutes : 43 minutes  Condition GUARDED   @SIGNATURE @

## 2018-09-06 ENCOUNTER — Observation Stay (HOSPITAL_BASED_OUTPATIENT_CLINIC_OR_DEPARTMENT_OTHER): Payer: Medicare HMO

## 2018-09-06 ENCOUNTER — Observation Stay (HOSPITAL_COMMUNITY): Payer: Medicare HMO

## 2018-09-06 DIAGNOSIS — R55 Syncope and collapse: Secondary | ICD-10-CM

## 2018-09-06 DIAGNOSIS — N179 Acute kidney failure, unspecified: Secondary | ICD-10-CM

## 2018-09-06 DIAGNOSIS — I639 Cerebral infarction, unspecified: Secondary | ICD-10-CM | POA: Diagnosis not present

## 2018-09-06 DIAGNOSIS — E1169 Type 2 diabetes mellitus with other specified complication: Secondary | ICD-10-CM

## 2018-09-06 DIAGNOSIS — E785 Hyperlipidemia, unspecified: Secondary | ICD-10-CM | POA: Diagnosis not present

## 2018-09-06 DIAGNOSIS — D170 Benign lipomatous neoplasm of skin and subcutaneous tissue of head, face and neck: Secondary | ICD-10-CM | POA: Diagnosis not present

## 2018-09-06 LAB — ECHOCARDIOGRAM COMPLETE
Height: 65 in
Weight: 3668.45 oz

## 2018-09-06 LAB — GLUCOSE, CAPILLARY
Glucose-Capillary: 77 mg/dL (ref 70–99)
Glucose-Capillary: 84 mg/dL (ref 70–99)
Glucose-Capillary: 51 mg/dL — ABNORMAL LOW (ref 70–99)
Glucose-Capillary: 67 mg/dL — ABNORMAL LOW (ref 70–99)
Glucose-Capillary: 82 mg/dL (ref 70–99)
Glucose-Capillary: 85 mg/dL (ref 70–99)

## 2018-09-06 LAB — TROPONIN I
Troponin I: <0.03 ng/mL (ref <0.03)
Troponin I: <0.03 ng/mL (ref <0.03)

## 2018-09-06 MED ORDER — LORAZEPAM 2 MG/ML IJ SOLN: 1.0000 mg | Freq: Once | INTRAMUSCULAR | Status: DC | PRN

## 2018-09-06 MED ORDER — LACTATED RINGERS IV SOLN
INTRAVENOUS | Status: AC
  Administered 2018-09-06: 18:00:00 via INTRAVENOUS

## 2018-09-06 NOTE — Progress Notes (Signed)
Hypoglycemic Event  CBG: 67  Treatment: 4 oz juice/soda  Symptoms: None  Follow-up CBG: Time: 1759 CBG Result: 82  Possible Reasons for Event: Unknown  Comments/MD notified    Andres [name] Clive Parcel

## 2018-09-06 NOTE — Progress Notes (Signed)
  Echocardiogram 2D Echocardiogram has been performed.  Jennette Dubin 09/06/2018, 9:14 AM

## 2018-09-06 NOTE — Progress Notes (Signed)
EEG completed, results pending. 

## 2018-09-06 NOTE — Procedures (Signed)
Andres A. Merlene Laughter, MD     [email or web address]           HISTORY: This is a 78 year old male who presents with recurrent episodes of loss of consciousness.  There is a previous history of right MCA infarct which raises suspicion that the patient could be having on weakness post stroke seizures.  MEDICATIONS: Scheduled Meds: . aspirin  81 mg Oral Daily  . atorvastatin  40 mg Oral Daily  . clopidogrel  75 mg Oral Daily  . enoxaparin (LOVENOX) injection  40 mg Subcutaneous Q24H  . folic acid  1 mg Oral Daily  . insulin aspart  0-5 Units Subcutaneous QHS  . insulin aspart  0-9 Units Subcutaneous TID WC  . levETIRAcetam  500 mg Oral BID  . metoprolol tartrate  50 mg Oral BID  . multivitamin  1 tablet Oral Daily  . omega-3 acid ethyl esters  1 g Oral BID  . tamsulosin  0.4 mg Oral Daily  . vitamin C  1,000 mg Oral Daily   Continuous Infusions: . lactated ringers     PRN Meds:.[DISCONTINUED] ondansetron **OR** ondansetron (ZOFRAN) IV  Prior to Admission medications   Medication Sig Start Date End Date Taking? Authorizing Provider  Acetaminophen 500 MG coapsule Take 500 mg by mouth every 6 (six) hours as needed for mild pain. Reported on 07/21/2015   Yes [provider]  Ascorbic Acid (VITAMIN C) 1000 MG tablet Take 1,000 mg by mouth daily.   Yes [provider]  Aspirin (ADULT ASPIRIN LOW STRENGTH) 81 MG EC tablet Take 81 mg by mouth daily.     Yes [provider]  atorvastatin (LIPITOR) 40 MG tablet Take 1 tablet (40 mg total) by mouth daily. 12/02/17  Yes [name], MD  clopidogrel (PLAVIX) 75 MG tablet Take 1 tablet (75 mg total) by mouth daily. 12/02/17  Yes [name], MD  diclofenac sodium (VOLTAREN) 1 % GEL Apply 2 g topically every 6 (six) hours as needed (for arthritis). 12/02/17  Yes [name], MD  folic acid (FOLVITE) 1 MG tablet Take 1 tablet (1 mg total) by mouth daily. 12/02/17  Yes [name], MD    losartan-hydrochlorothiazide (HYZAAR) 50-12.5 MG tablet Take 1 tablet by mouth daily. 12/02/17  Yes [name], MD  metoprolol tartrate (LOPRESSOR) 50 MG tablet Take 1 tablet (50 mg total) by mouth 2 (two) times daily. 12/02/17  Yes [name], MD  Multiple Vitamin (MULTIVITAMIN) capsule Take 1 capsule by mouth daily.    Yes [provider]  Omega-3 Fatty Acids (OMEGA 3 PO) Take 360 mg by mouth 2 (two) times daily.   Yes [provider]  tamsulosin (FLOMAX) 0.4 MG CAPS capsule Take 1 capsule (0.4 mg total) by mouth daily. 12/02/17  Yes [name], MD  indomethacin (INDOCIN) 50 MG capsule Take 1 capsule (50 mg total) by mouth 3 (three) times daily with meals. Patient not taking: Reported on 09/05/2018 07/10/18   Luetta Nutting, DO      ANALYSIS: A 16 channel recording using standard 10 20 measurements is conducted for 21 minutes.  There is a well-formed posterior dominant rhythm of 7.5 Hz which attenuates with eye opening.  There is beta activity observed in frontal areas.  Awake and drowsy activities are observed.  Photic stimulation and hyperventilation are not conducted.  There is no focal or lateralized slowing.  There is no epileptiform activity is observed.   IMPRESSION: 1.  This recording of the awake and drowsy states shows mild global slowing indicating a mild global encephalopathy.  However, there is no evidence of epileptiform discharges or focal slowing.      Dakoda Bassette A. Merlene [name], M.D.  Diplomate, Tax adviser of Psychiatry and Neurology ( Neurology).

## 2018-09-06 NOTE — Progress Notes (Signed)
PROGRESS NOTE                                                                                                                                                                                                             Patient Demographics:    Andres [name], is a 78 y.o. male, DOB - May 24, 1941, JSH:[id number]  Admit date - 09/05/2018   Admitting Physician Merton Border, MD  Outpatient Primary MD for the patient is [name], MD  LOS - 0  Chief Complaint  Patient presents with  . Loss of Consciousness  . Seizures       Brief Narrative  Andres [name]  is a 78 y.o. 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  Patient the does not reports a loss of consciousness.  The episode was witnessed with possible seizure activity with incontinence of bowel and bladder.  No aura or postictal symptoms.  Patient had a similar episode 3 weeks ago but he did not seek any medical attention   Subjective:    Andres [name] today has, No headache, No chest pain, No abdominal pain - No Nausea, No new weakness tingling or numbness, No Cough - SOB.     Assessment  & Plan :      1.  Syncopal episode with some suspicion for seizure-like activity, bowel bladder incontinence with some jerking movements of the arms.  Seen by neurology, currently placed on Keppra, CT nonacute, pending MRI EEG, gradually advance activity, PT eval.  Monitor on telemetry.  Currently symptom-free will continue to monitor on Keppra.  2.  HX of right MCA infarct in the past.  Currently stable.  Continue dual antiplatelet therapy and statin for secondary prevention.  3.  ARF on CKD 3.  Baseline creatinine around 1.2.  Hold diuretic and ACE inhibitor, gently hydrate repeat BMP in the morning.  4. DyslLipidemia.  On statin.  5.  BPH.  On Flomax.  6.  Hypertension.   Continue home dose beta-blocker.  7.  DM type II.  Currently on sliding scale.  CBG (last 3)  Recent Labs    09/05/18 2303 09/06/18 0810  GLUCAP 130* 77       Family Communication  :  None  Code Status :  Full  Disposition Plan  :  TBD  Consults  :  Neuro  Procedures  :  MRI  EEG  CT - nonacute  DVT Prophylaxis  :  Lovenox    Lab Results  Component Value Date   PLT 175 09/05/2018    Diet :  Diet Order            Diet Carb Modified Fluid consistency: Thin; Room service appropriate? Yes  Diet effective now               Inpatient Medications Scheduled Meds: . aspirin  81 mg Oral Daily  . atorvastatin  40 mg Oral Daily  . clopidogrel  75 mg Oral Daily  . enoxaparin (LOVENOX) injection  40 mg Subcutaneous Q24H  . folic acid  1 mg Oral Daily  . insulin aspart  0-5 Units Subcutaneous QHS  . insulin aspart  0-9 Units Subcutaneous TID WC  . levETIRAcetam  500 mg Oral BID  . metoprolol tartrate  50 mg Oral BID  . multivitamin  1 tablet Oral Daily  . omega-3 acid ethyl esters  1 g Oral BID  . tamsulosin  0.4 mg Oral Daily  . vitamin C  1,000 mg Oral Daily   Continuous Infusions: . lactated ringers     PRN Meds:.[DISCONTINUED] ondansetron **OR** ondansetron (ZOFRAN) IV  Antibiotics  :   Anti-infectives (From admission, onward)   None          Objective:   Vitals:   09/05/18 2201 09/06/18 0526 09/06/18 0836 09/06/18 0837  BP: 92/60 (!) 94/53 98/81 113/69  Pulse: 71 67 78 74  Resp: 18 18 18 18   Temp: 98 F (36.7 C) 97.9 F (36.6 C) 98 F (36.7 C) 98 F (36.7 C)  TempSrc:  Oral Oral Oral  SpO2: 100% 96% 100% 100%  Weight:      Height:        Wt Readings from Last 3 Encounters:  09/05/18 104 kg  07/10/18 108.4 kg  06/09/18 106.6 kg     Intake/Output Summary (Last 24 hours) at 09/06/2018 1033 Last data filed at 09/05/2018 1410 Gross per 24 hour  Intake 1000 ml  Output -  Net 1000 ml     Physical Exam  Awake Alert, Oriented  X 3, No new F.N deficits, Normal affect .AT,PERRAL Supple Neck,No JVD, No cervical lymphadenopathy appriciated.  Symmetrical Chest wall movement, Good air movement bilaterally, CTAB RRR,No Gallops,Rubs or new Murmurs, No Parasternal Heave +ve B.Sounds, Abd Soft, No tenderness, No organomegaly appriciated, No rebound - guarding or rigidity. No Cyanosis, Clubbing or edema, No new Rash or bruise      Data Review:    CBC Recent Labs  Lab 09/05/18 1055  WBC 4.8  HGB 12.4*  HCT 39.0  PLT 175  MCV 99.2  MCH 31.6  MCHC 31.8  RDW 12.8  LYMPHSABS 1.4  MONOABS 0.6  EOSABS 0.0  BASOSABS 0.0    Chemistries  Recent Labs  Lab 09/05/18 1055  NA 141  K 4.4  CL 104  CO2 26  GLUCOSE 102*  BUN 29*  CREATININE 1.98*  CALCIUM 9.5  AST 33  ALT 20  ALKPHOS 56  BILITOT 0.9   ------------------------------------------------------------------------------------------------------------------ No results for input(s): CHOL, HDL, LDLCALC, TRIG, CHOLHDL, LDLDIRECT in the last 72 hours.  Lab Results  Component Value Date   HGBA1C 5.6 06/09/2018   ------------------------------------------------------------------------------------------------------------------ No results for input(s): TSH, T4TOTAL, T3FREE, THYROIDAB in the last 72 hours.  Invalid input(s): FREET3 ------------------------------------------------------------------------------------------------------------------ No results for input(s): VITAMINB12, FOLATE, FERRITIN, TIBC, IRON, RETICCTPCT in the last 72 hours.  Coagulation  profile Recent Labs  Lab 09/05/18 1055  INR 0.97    No results for input(s): DDIMER in the last 72 hours.  Cardiac Enzymes Recent Labs  Lab 09/05/18 1842 09/05/18 2320 09/06/18 0520  TROPONINI <0.03 <0.03 <0.03   ------------------------------------------------------------------------------------------------------------------ No results found for: BNP  Micro Results No results found  for this or any previous visit (from the past 240 hour(s)).  Radiology Reports Ct Head Wo Contrast  Result Date: 09/05/2018 CLINICAL DATA:  a witnessed syncopal episode that lasted 2 minutes. Some witnesses report shaking and possible seizure activity prior to episode. EXAM: CT HEAD WITHOUT CONTRAST TECHNIQUE: Contiguous axial images were obtained from the base of the skull through the vertex without intravenous contrast. COMPARISON:  Brain MRI, 02/07/2008 FINDINGS: Brain: No evidence of acute infarction, hemorrhage, hydrocephalus, extra-axial collection or mass lesion/mass effect. Old right posterior MCA distribution infarct, which was present on the prior brain MRI. Mild bilateral periventricular white matter hypoattenuation consistent with chronic microvascular ischemic change. Vascular: No hyperdense vessel or unexpected calcification. Skull: Normal. Negative for fracture or focal lesion. Sinuses/Orbits: Globes and orbits are unremarkable. Sinuses and mastoid air cells are clear. Other: None. IMPRESSION: 1. No acute intracranial abnormalities. 2. 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 Mild chronic microvascular ischemic change. Electronically Signed   By: Lajean Manes M.D.   On: 09/05/2018 11:15   Dg Chest Port 1 View  Result Date: 09/05/2018 CLINICAL DATA:  Per EMS: pt coming from church with a witnessed syncopal episode that lasted 2 minutes. Some witnesses report shaking and possible seizure activity prior to episode. EXAM: PORTABLE CHEST 1 VIEW COMPARISON:  07/31/2013 FINDINGS: Stable changes from prior CABG surgery. Cardiac silhouette is normal in size and configuration. No mediastinal or hilar masses. No evidence of adenopathy. Clear lungs.  No pleural effusion or pneumothorax. Skeletal structures are grossly intact. IMPRESSION: No acute cardiopulmonary disease. Electronically Signed   By: Lajean Manes M.D.   On: 09/05/2018 10:53    Time Spent in minutes  30   Lala Lund M.D on 09/06/2018 at 10:33 AM  To page go to [email or web address] - password [facility]

## 2018-09-06 NOTE — Progress Notes (Addendum)
NEUROLOGY PROGRESS NOTE  Subjective: Patient has no complaints at this time.  Has had no further presyncopal episodes. He states that the two episodes occurring at church weeks apart both occurred immediately after standing up during church service. He states that he felt presyncopal prior to each episode. He is alert and oriented.  Exam: Vitals:   09/06/18 0836 09/06/18 0837  BP: 98/81 113/69  Pulse: 78 74  Resp: 18 18  Temp: 98 F (36.7 C) 98 F (36.7 C)  SpO2: 100% 100%    Physical Exam   HEENT-  Normocephalic, no lesions, without obvious abnormality.  Normal external eye and conjunctiva. Extremities- Warm, dry and intact Musculoskeletal-no joint tenderness, deformity or swelling Skin-warm and dry, no hyperpigmentation, vitiligo, or suspicious lesions  Neuro:  Mental Status: Alert, oriented, thought content appropriate.  Speech fluent without evidence of aphasia.  Able to follow 3 step commands without difficulty. Cranial Nerves: II:  Visual fields grossly normal,  III,IV, VI: ptosis not present, extra-ocular motions intact bilaterally pupils equal, round, reactive to light and accommodation V,VII: smile symmetric, facial light touch sensation normal bilaterally VIII: hearing normal bilaterally Motor: Right : Upper extremity   5/5    Left:     Upper extremity   5/5  Lower extremity   5/5     Lower extremity   5/5 Tone and bulk:normal tone throughout; no atrophy noted Sensory: Pinprick and light touch intact throughout, bilaterally Deep Tendon Reflexes: 2+ and symmetric throughout 3+ knee jerks bilaterally Plantars: Right: downgoing   Left: downgoing Cerebellar: normal finger-to-nose and normal heel-to-shin testing Gait: normal gait and station    Medications:  Scheduled: . aspirin  81 mg Oral Daily  . atorvastatin  40 mg Oral Daily  . clopidogrel  75 mg Oral Daily  . enoxaparin (LOVENOX) injection  40 mg Subcutaneous Q24H  . folic acid  1 mg Oral Daily  . losartan   50 mg Oral Daily   Or  . hydrochlorothiazide  12.5 mg Oral Daily  . insulin aspart  0-5 Units Subcutaneous QHS  . insulin aspart  0-9 Units Subcutaneous TID WC  . levETIRAcetam  500 mg Oral BID  . metoprolol tartrate  50 mg Oral BID  . multivitamin  1 tablet Oral Daily  . omega-3 acid ethyl esters  1 g Oral BID  . tamsulosin  0.4 mg Oral Daily  . vitamin C  1,000 mg Oral Daily    Pertinent Labs/Diagnostics:   Ct Head Wo Contrast  Result Date: 09/05/2018 CLINICAL DATA:  a witnessed syncopal episode that lasted 2 minutes. Some witnesses report shaking and possible seizure activity prior to episode. EXAM: CT HEAD WITHOUT CONTRAST TECHNIQUE: Contiguous axial images were obtained from the base of the skull through the vertex without intravenous contrast. COMPARISON:  Brain MRI, 02/07/2008 FINDINGS: Brain: No evidence of acute infarction, hemorrhage, hydrocephalus, extra-axial collection or mass lesion/mass effect. Old right posterior MCA distribution infarct, which was present on the prior brain MRI. Mild bilateral periventricular white matter hypoattenuation consistent with chronic microvascular ischemic change. Vascular: No hyperdense vessel or unexpected calcification. Skull: Normal. Negative for fracture or focal lesion. Sinuses/Orbits: Globes and orbits are unremarkable. Sinuses and mastoid air cells are clear. Other: None. IMPRESSION: 1. No acute intracranial abnormalities. 2. 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 Mild chronic microvascular ischemic change. Electronically Signed   By: Lajean Manes M.D.   On: 09/05/2018 11:15   Dg Chest Port 1 View  Result Date: 09/05/2018 CLINICAL DATA:  Per EMS: pt coming from church with a witnessed syncopal episode that lasted 2 minutes. Some witnesses report shaking and possible seizure activity prior to episode. EXAM: PORTABLE CHEST 1 VIEW COMPARISON:  07/31/2013 FINDINGS: Stable changes from prior CABG surgery. Cardiac silhouette  is normal in size and configuration. No mediastinal or hilar masses. No evidence of adenopathy. Clear lungs.  No pleural effusion or pneumothorax. Skeletal structures are grossly intact. IMPRESSION: No acute cardiopulmonary disease. Electronically Signed   By: Lajean Manes M.D.   On: 09/05/2018 10:53   EEG: This recording of the awake and drowsy states shows mild global slowing indicating a mild global encephalopathy.  However, there is no evidence of epileptiform discharges or focal slowing.   Etta Quill PA-C [facility] [phone]   Assessment: 78 year old male presenting to the hospital with presyncopal episodes.  Patient does have an old right MCA territory infarct and has had a stereotypical event and history with similar events in the past.  The stereotypy is more consistent with near-syncope than seizure.  1. On initial evaluation, given patient's old right MCA cortical infarct there was suspicion for possible seizure as the etiology.  Patient was started on Keppra 500 mg twice daily and is tolerating well.   2. Static blood pressures were obtained today. Supine blood pressure was 98/81 and standing blood pressure was 113/69. 3. Given positive orthostatics and EEG negative for epileptiform discharges or focal slowing, the most likely etiology for the patient's presentation is presyncope with convulsive activity (arm shaking). The patient did not lose consciousness with the event, although he did experience bladder/bowel incontinence. Although hypotension is the most likely etiology for his presentation, he did have an episode of staring off several years ago without known hypotension at that time. Overall benefits/risks favor continuing Keppra.   Recommendations: -MRI brain without contrast pending -Continue Keppra 500 mg twice daily -Seizure precautions -Will need follow-up with neurologist after discharge -Syncope work-up per primary team -Echocardiogram pending  -  Instructions given to patient and patient verbalized understanding: Per [facility] statutes, patients with seizures are not allowed to drive until  they have been seizure-free for six months. Use caution when using heavy equipment or power tools. Avoid working on ladders or at heights. Take showers instead of baths. Ensure the water temperature is not too high on the home water heater. Do not go swimming alone. When caring for infants or small children, sit down when holding, feeding, or changing them to minimize risk of injury to the child in the event you have a seizure. Also, Maintain good sleep hygiene. Avoid alcohol.   Electronically signed: Dr. Kerney Elbe 09/06/2018, 8:41 AM

## 2018-09-06 NOTE — Evaluation (Signed)
Physical Therapy Evaluation Patient Details Name: Andres [name] MRN: [id number] DOB: [birth year] Today's Date: 09/06/2018   History of Present Illness  78 yo male with onset of near syncopal episode with seizure like activity at church was admitted, has  acute renal failure.  PMHx:  CAD, CABG, old R posterior distribution MCA infarct, CKD 3,  HTN, DM  Clinical Impression  Pt was seen for mobility with min guard for safety to walk given his seizure like behavior at admission.  However, EEG was clearing of this today, and his main concern for PT is the drop in O2 sats with his gait.  Initially was at 98% to sit bedside on room air, and after walk was down to 85% for about 2 minutes.  Thereafter was up to 99% and so shared with nursing and hospitalist.  Follow acutely for these needs and will continue to see how he progresses, as to whether O2 is needed to walk or not.    Follow Up Recommendations Home health PT;Supervision for mobility/OOB(to monitor O2 sats)    Equipment Recommendations  None recommended by PT    Recommendations for Other Services       Precautions / Restrictions Precautions Precautions: Fall(telemetry) Precaution Comments: monitor O2 sats Restrictions Weight Bearing Restrictions: No      Mobility  Bed Mobility Overal bed mobility: Modified Independent                Transfers Overall transfer level: Modified independent Equipment used: None             General transfer comment: used hand placemetn on bed to power up  Ambulation/Gait Ambulation/Gait assistance: Min guard(only for safety d/t susp seizures) Gait Distance (Feet): 180 Feet Assistive device: 1 person hand held assist Gait Pattern/deviations: Step-through pattern;Decreased stride length;Wide base of support Gait velocity: reduced Gait velocity interpretation: <1.31 ft/sec, indicative of household ambulator General Gait Details: pt is not demonstrating any signs of  instability  Stairs            Wheelchair Mobility    Modified Rankin (Stroke Patients Only) Modified Rankin (Stroke Patients Only) Pre-Morbid Rankin Score: No significant disability Modified Rankin: Slight disability     Balance Overall balance assessment: No apparent balance deficits (not formally assessed)                                           Pertinent Vitals/Pain Pain Assessment: No/denies pain    Home Living Family/patient expects to be discharged to:: Private residence Living Arrangements: Alone Available Help at Discharge: Family;Friend(s);Available PRN/intermittently Type of Home: House         Home Equipment: Cane - single point Additional Comments: Pt used the cane very little    Prior Function Level of Independence: Independent         Comments: walking in neighborhood to get to church     Hand Dominance   Dominant Hand: Right    Extremity/Trunk Assessment   Upper Extremity Assessment Upper Extremity Assessment: Overall WFL for tasks assessed    Lower Extremity Assessment Lower Extremity Assessment: Overall WFL for tasks assessed    Cervical / Trunk Assessment Cervical / Trunk Assessment: Normal  Communication   Communication: No difficulties  Cognition Arousal/Alertness: Awake/alert Behavior During Therapy: WFL for tasks assessed/performed Overall Cognitive Status: Within Functional Limits for tasks assessed  General Comments General comments (skin integrity, edema, etc.): pt is able to navigate on the hall with no change in control     Exercises     Assessment/Plan    PT Assessment Patient needs continued PT services  PT Problem List Decreased strength;Decreased range of motion;Decreased activity tolerance;Decreased mobility;Decreased safety awareness;Cardiopulmonary status limiting activity;Obesity       PT Treatment Interventions DME  instruction;Gait training;Functional mobility training;Therapeutic activities;Therapeutic exercise;Balance training;Neuromuscular re-education;Patient/family education;Stair training    PT Goals (Current goals can be found in the Care Plan section)  Acute Rehab PT Goals Patient Stated Goal: to get home and feel better PT Goal Formulation: With patient Time For Goal Achievement: 09/20/18 Potential to Achieve Goals: Good    Frequency Min 4X/week   Barriers to discharge Decreased caregiver support home independently    Co-evaluation               AM-PAC PT "6 Clicks" Mobility  Outcome Measure Help needed turning from your back to your side while in a flat bed without using bedrails?: None Help needed moving from lying on your back to sitting on the side of a flat bed without using bedrails?: None Help needed moving to and from a bed to a chair (including a wheelchair)?: A Little Help needed standing up from a chair using your arms (e.g., wheelchair or bedside chair)?: A Little Help needed to walk in hospital room?: A Little Help needed climbing 3-5 steps with a railing? : Total 6 Click Score: 18    End of Session Equipment Utilized During Treatment: Gait belt Activity Tolerance: Treatment limited secondary to medical complications (Comment)(O2 sats) Patient left: in bed;with call bell/phone within reach;with bed alarm set;with family/visitor present Nurse Communication: Mobility status;Other (comment)(messaged MD with the drop in O2 sats) PT Visit Diagnosis: Muscle weakness (generalized) (M62.81);Other abnormalities of gait and mobility (R26.89)    Time: 1140-1200 PT Time Calculation (min) (ACUTE ONLY): 20 min   Charges:   PT Evaluation $PT Eval Moderate Complexity: 1 Mod         Ramond Dial 09/06/2018, 4:18 PM  Mee Hives, PT MS Acute Rehab Dept. Number: [place] [facility] [place]

## 2018-09-06 NOTE — Progress Notes (Signed)
Hypoglycemic Event  CBG: 51  Treatment: 4 oz juice/soda  Symptoms: None  Follow-up CBG: Time: 1739 CBG Result:67  Possible Reasons for Event: Unknown  Comments/MD notified:    [place] N Maeleigh Buschman

## 2018-09-07 DIAGNOSIS — E785 Hyperlipidemia, unspecified: Secondary | ICD-10-CM | POA: Diagnosis not present

## 2018-09-07 DIAGNOSIS — I639 Cerebral infarction, unspecified: Secondary | ICD-10-CM | POA: Diagnosis not present

## 2018-09-07 DIAGNOSIS — E1169 Type 2 diabetes mellitus with other specified complication: Secondary | ICD-10-CM | POA: Diagnosis not present

## 2018-09-07 DIAGNOSIS — N179 Acute kidney failure, unspecified: Secondary | ICD-10-CM | POA: Diagnosis not present

## 2018-09-07 LAB — BASIC METABOLIC PANEL
Anion gap: 9 (ref 5–15)
BUN: 24 mg/dL — ABNORMAL HIGH (ref 8–23)
CO2: 27 mmol/L (ref 22–32)
CREATININE: 1.71 mg/dL — AB (ref 0.61–1.24)
Calcium: 8.9 mg/dL (ref 8.9–10.3)
Chloride: 104 mmol/L (ref 98–111)
GFR calc Af Amer: 43 mL/min — ABNORMAL LOW (ref >60)
GFR calc non Af Amer: 37 mL/min — ABNORMAL LOW (ref >60)
Glucose, Bld: 93 mg/dL (ref 70–99)
Potassium: 4.3 mmol/L (ref 3.5–5.1)
Sodium: 140 mmol/L (ref 135–145)

## 2018-09-07 LAB — GLUCOSE, CAPILLARY: Glucose-Capillary: 100 mg/dL — ABNORMAL HIGH (ref 70–99)

## 2018-09-07 MED ORDER — LEVETIRACETAM 500 MG PO TABS: 500.0000 mg | ORAL_TABLET | Freq: Two times a day (BID) | ORAL | 0 refills | Status: DC

## 2018-09-07 NOTE — Discharge Instructions (Signed)
Do not drive, operate heavy machinery, perform activities at heights, swimming or participation in water activities or provide baby sitting services until you have seen by Primary MD or a Neurologist and advised to do so again.  Follow with Primary MD [name], MD in 7 days   Get CBC, CMP, 2 view Chest X ray -  checked  by Primary MD or SNF MD in 5-7 days (   Activity: As tolerated with Full fall precautions use walker/cane & assistance as needed  Disposition Home    Diet: Heart Healthy    Special Instructions: If you have smoked or chewed Tobacco  in the last 2 yrs please stop smoking, stop any regular Alcohol  and or any Recreational drug use.  On your next visit with your primary care physician please Get Medicines reviewed and adjusted.  Please request your Prim.MD to go over all Hospital Tests and Procedure/Radiological results at the follow up, please get all Hospital records sent to your Prim MD by signing hospital release before you go home.  If you experience worsening of your admission symptoms, develop shortness of breath, life threatening emergency, suicidal or homicidal thoughts you must seek medical attention immediately by calling 911 or calling your MD immediately  if symptoms less severe.  You Must read complete instructions/literature along with all the possible adverse reactions/side effects for all the Medicines you take and that have been prescribed to you. Take any new Medicines after you have completely understood and accpet all the possible adverse reactions/side effects.

## 2018-09-07 NOTE — Progress Notes (Signed)
MRI brain:  1. No acute intracranial abnormality. 2. Chronic right posterior MCA distribution infarction. Moderate chronic microvascular ischemic changes and mild volume loss of the Brain.  Echocardiogram: 1. The left ventricle has normal systolic function of 03-54%. The cavity size is normal. There is normal left ventricular wall thickness. Echo evidence of impaired relaxation diastolic filling patterns. Normal left ventricular filling pressures.  2. Normal left atrial size.  3. Normal right atrial size.  4. Normal tricuspid valve.  5. No atrial level shunt detected by color flow Doppler.  Assessment: 78 year old male presenting to the hospital with presyncopal episodes.   1. MRI brain confirms chronic infarct in the right posterior MCA distribution with no acute abnormality noted. The old stroke has the potential to serve as a seizure onset zone.  2. Also on DDx would be syncopal convulsions, given positive orthostatics yesterday and EEG which was negative for epileptiform discharges or focal slowing. Also, the patient did not lose consciousness with the event, although he did experience bladder/bowel incontinence. Although orthostatic hypotension with syncopal convulsion is felt to be the most likely etiology for his presentation, he did have an episode of staring off several years ago without known hypotension at that time. Overall benefits/risks favor continuing Keppra.   Recommendations: 1. Continue Keppra 500 mg twice daily 2. Will need follow-up with neurologist after discharge 3. Orthostatic hypotension etiology most likely due to volume depletion +/- autonomic dysfunction. Work-up to assess for underlying etiology per primary team 4. Seizure precautions. Instructions given to patient yesterday and patient verbalized understanding:Per [facility] statutes, patients with seizures are not allowed to drive until they have been seizure-free for six months. Use caution when using  heavy equipment or power tools. Avoid working on ladders or at heights. Take showers instead of baths. Ensure the water temperature is not too high on the home water heater. Do not go swimming alone. When caring for infants or small children, sit down when holding, feeding, or changing them to minimize risk of injury to the child in the event you have a seizure. Also, Maintain good sleep hygiene. Avoid alcohol. 5. Neurology will sign off at this time. Please call if there are additional questions.   Electronically signed: Dr. Kerney Elbe

## 2018-09-07 NOTE — Care Management Note (Addendum)
Case Management Note  Patient Details  Name: Andres [name] MRN: [id number] Date of Birth: Feb 21, 1941  Subjective/Objective:        Syncope. From home with son. States independent with ADL's, no DME usage.          Guy Seese ([address]) Smith Potenza (Daughter)    [phone]     PCP: Cathlean Cower  Action/Plan: Transition to home with home health services to follow. Pt has transportation to home.  Expected Discharge Date:  09/07/18               Expected Discharge Plan:  [place]  In-House Referral:  NA  Discharge planning Services  CM Consult  Post Acute Care Choice:    Choice offered to:  Patient  DME Arranged:  N/A DME Agency:  NA  HH Arranged:  RN, PT HH Agency:  [facility]  Status of Service:  Completed, signed off  If discussed at [place] of Stay Meetings, dates discussed:    Additional Comments:  Sharin Mons, RN 09/07/2018, 9:45 AM

## 2018-09-07 NOTE — Discharge Summary (Signed)
Andres [name]:[id number] DOB: 03-Dec-1940 DOA: 09/05/2018  PCP: Andres Borg, MD  Admit date: 09/05/2018  Discharge date: 09/07/2018  Admitted From: Home   Disposition:  Home   Recommendations for Outpatient Follow-up:   Follow up with PCP in 1-2 weeks  PCP Please obtain BMP/CBC, 2 view CXR in 1week,  (see Discharge instructions)   PCP Please follow up on the following pending results:    Home Health: PT,RN   Equipment/Devices: Conservation officer, nature  Consultations: Neuro Discharge Condition: Stable   CODE STATUS: Full   Diet Recommendation: Heart Healthy Low Carb     Chief Complaint  Patient presents with  . Loss of Consciousness  . 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 Patient the does not reports a loss of consciousness. The episode was witnessed with possible seizure activity with incontinence of bowel and bladder. No aura or postictal symptoms. Patient had a similar episode 3 weeks ago but he did not seek any medical attention                                                                 Hospital Course     1.  Syncopal episode with some suspicion for seizure-like activity, bowel bladder incontinence with some jerking movements of the arms.  Seen by neurology, currently placed on Keppra, CT scan brain, MRI brain were nonacute, EEG was nonspecific but no seizure focus.  Echocardiogram showed a preserved EF of 60% without any suspicious findings for CVA.  He is back to his baseline, will be continued on Keppra  with outpatient neurology follow-up, written instructions given not to drive till cleared by neurology, home PT ordered as well along with a rolling walker, will be discharged home.  2.  HX of right MCA infarct in the past.  Currently stable.  Continue dual antiplatelet therapy and statin for secondary prevention.  3.  ARF on CKD 3.  Baseline creatinine around 1.2.    Dehydrated and his diuretic and ACE inhibitor were held, now back to baseline, will discontinue chronic use of NSAIDs at home, resume home blood pressure medications PCP to monitor BMP and adjust.  4. DyslLipidemia.  On statin.  5.  BPH.  On Flomax.  6.  Hypertension.  Continue home dose beta-blocker along with ACE inhibitor and diuretic combination.  7.  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   Discharge diagnosis     Active Problems:   Syncope    Discharge instructions    Discharge Instructions  Diet - low sodium heart healthy   Complete by:  As directed    Discharge instructions   Complete by:  As directed    Do not drive, operate heavy machinery, perform activities at heights, swimming or participation in water activities or provide baby sitting services until you have seen by Primary MD or a Neurologist and advised to do so again.  Follow with Primary MD Andres Borg, MD in 7 days   Get CBC, CMP, 2 view Chest X ray -  checked  by Primary MD or SNF MD in 5-7 days (   Activity: As tolerated with Full fall precautions use walker/cane & assistance as needed  Disposition Home    Diet: Heart Healthy    Special Instructions: If you have smoked or chewed Tobacco  in the last 2 yrs please stop smoking, stop any regular Alcohol  and or any Recreational drug use.  On your next visit with your primary care physician please Get Medicines reviewed and adjusted.  Please request your Prim.MD to go over all Hospital Tests and Procedure/Radiological results at the follow up, please get all Hospital records sent to your Prim MD  by signing hospital release before you go home.  If you experience worsening of your admission symptoms, develop shortness of breath, life threatening emergency, suicidal or homicidal thoughts you must seek medical attention immediately by calling 911 or calling your MD immediately  if symptoms less severe.  You Must read complete instructions/literature along with all the possible adverse reactions/side effects for all the Medicines you take and that have been prescribed to you. Take any new Medicines after you have completely understood and accpet all the possible adverse reactions/side effects.   For home use only DME 4 wheeled rolling walker with seat   Complete by:  As directed    Patient needs a walker to treat with the following condition:  Syncope   Increase activity slowly   Complete by:  As directed       Discharge Medications   Allergies as of 09/07/2018   No Known Allergies     Medication List    STOP taking these medications   indomethacin 50 MG capsule Commonly known as:  INDOCIN     TAKE these medications   Acetaminophen 500 MG coapsule Take 500 mg by mouth every 6 (six) hours as needed for mild pain. Reported on 07/21/2015   ADULT ASPIRIN LOW STRENGTH 81 MG EC tablet Generic drug:  Aspirin Take 81 mg by mouth daily.   atorvastatin 40 MG tablet Commonly known as:  LIPITOR Take 1 tablet (40 mg total) by mouth daily.   clopidogrel 75 MG tablet Commonly known as:  PLAVIX Take 1 tablet (75 mg total) by mouth daily.   diclofenac sodium 1 % Gel Commonly known as:  VOLTAREN Apply 2 g topically every 6 (six) hours as needed (for arthritis).   folic acid 1 MG tablet Commonly known as:  FOLVITE Take 1 tablet (1 mg total) by mouth daily.   levETIRAcetam 500 MG tablet Commonly known as:  KEPPRA Take 1 tablet (500 mg total) by mouth 2 (two) times daily.   losartan-hydrochlorothiazide 50-12.5 MG tablet Commonly known as:  HYZAAR Take 1 tablet by mouth daily.     metoprolol tartrate 50 MG tablet Commonly known as:  LOPRESSOR Take 1 tablet (50 mg total) by mouth 2 (two) times daily.   multivitamin capsule Take 1 capsule by mouth daily.   OMEGA 3 PO Take  360 mg by mouth 2 (two) times daily.   tamsulosin 0.4 MG Caps capsule Commonly known as:  FLOMAX Take 1 capsule (0.4 mg total) by mouth daily.   vitamin C 1000 MG tablet Take 1,000 mg by mouth daily.            Durable Medical Equipment  (From admission, onward)         Start     Ordered   09/07/18 0000  For home use only DME 4 wheeled rolling walker with seat    Question:  Patient needs a walker to treat with the following condition  Answer:  Syncope   09/07/18 5956          Follow-up Information    Andres Borg, MD. Schedule an appointment as soon as possible for a visit in 1 week(s).   Specialties:  Internal Medicine, Radiology Contact information: [place] [address] [phone]        [facility]. Schedule an appointment as soon as possible for a visit in 1 week(s).   Contact information: [address]     [place] [id number] [phone]          Major procedures and Radiology Reports - PLEASE review detailed and final reports thoroughly  -      Echocardiogram.  EF 01% mild diastolic CHF.  EEG -unspecific but no seizure-like activity noted.  CT - nonacute   Ct Head Wo Contrast  Result Date: 09/05/2018 CLINICAL DATA:  a witnessed syncopal episode that lasted 2 minutes. Some witnesses report shaking and possible seizure activity prior to episode. EXAM: CT HEAD WITHOUT CONTRAST TECHNIQUE: Contiguous axial images were obtained from the base of the skull through the vertex without intravenous contrast. COMPARISON:  Brain MRI, 02/07/2008 FINDINGS: Brain: No evidence of acute infarction, hemorrhage, hydrocephalus, extra-axial collection or mass lesion/mass effect. Old right posterior  MCA distribution infarct, which was present on the prior brain MRI. Mild bilateral periventricular white matter hypoattenuation consistent with chronic microvascular ischemic change. Vascular: No hyperdense vessel or unexpected calcification. Skull: Normal. Negative for fracture or focal lesion. Sinuses/Orbits: Globes and orbits are unremarkable. Sinuses and mastoid air cells are clear. Other: None. IMPRESSION: 1. No acute intracranial abnormalities. 2. 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 Mild chronic microvascular ischemic change. Electronically Signed   By: Lajean Manes M.D.   On: 09/05/2018 11:15   Mr Brain Wo Contrast  Result Date: 09/06/2018 CLINICAL DATA:  78 y/o  M; 3 weeks of dizziness upon standing. EXAM: MRI HEAD WITHOUT CONTRAST TECHNIQUE: Multiplanar, multiecho pulse sequences of the brain and surrounding structures were obtained without intravenous contrast. COMPARISON:  09/05/2018 CT head.  02/07/2008 MRI head. FINDINGS: Brain: No acute infarction, hemorrhage, hydrocephalus, extra-axial collection or mass lesion. Chronic infarct in the right posterior MCA distribution. Early confluent nonspecific T2 FLAIR hyperintensities in subcortical and periventricular white matter are compatible with moderate chronic microvascular ischemic changes. Mild volume loss of the brain. There is hemosiderin staining of the right posterior MCA chronic infarction. Additionally, there are a few punctate foci of chronic microhemorrhage in the brain, probably sequelae of chronic hypertension. Vascular: Normal flow voids. Skull and upper cervical spine: Normal marrow signal. Sinuses/Orbits: Negative. 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 IMPRESSION: 1. No acute intracranial abnormality. 2. Chronic right posterior MCA distribution infarction. Moderate chronic microvascular ischemic changes and mild volume loss of the brain. Electronically Signed   By: Kristine Garbe  M.D.   On:  09/06/2018 15:56   Dg Chest Port 1 View  Result Date: 09/05/2018 CLINICAL DATA:  Per EMS: pt coming from church with a witnessed syncopal episode that lasted 2 minutes. Some witnesses report shaking and possible seizure activity prior to episode. EXAM: PORTABLE CHEST 1 VIEW COMPARISON:  07/31/2013 FINDINGS: Stable changes from prior CABG surgery. Cardiac silhouette is normal in size and configuration. No mediastinal or hilar masses. No evidence of adenopathy. Clear lungs.  No pleural effusion or pneumothorax. Skeletal structures are grossly intact. IMPRESSION: No acute cardiopulmonary disease. Electronically Signed   By: Lajean Manes M.D.   On: 09/05/2018 10:53    Micro Results     No results found for this or any previous visit (from the past 240 hour(s)).  Today   Subjective    Andres [name] today has no headache,no chest abdominal pain,no new weakness tingling or numbness, feels much better wants to go home today    Objective   Blood pressure 114/69, pulse 64, temperature 98.2 F (36.8 C), temperature source Oral, resp. rate 18, height 5\' 5"  (1.651 m), weight 104 kg, SpO2 100 %.   Intake/Output Summary (Last 24 hours) at 09/07/2018 0853 Last data filed at 09/06/2018 1809 Gross per 24 hour  Intake 0 ml  Output -  Net 0 ml    Exam Awake Alert, Oriented x 3, No new F.N deficits, Normal affect [place].AT,PERRAL Supple Neck,No JVD, No cervical lymphadenopathy appriciated.  Symmetrical Chest wall movement, Good air movement bilaterally, CTAB RRR,No Gallops,Rubs or new Murmurs, No Parasternal Heave +ve B.Sounds, Abd Soft, Non tender, No organomegaly appriciated, No rebound -guarding or rigidity. No Cyanosis, Clubbing or edema, No new Rash or bruise   Data Review   CBC w Diff:  Lab Results  Component Value Date   WBC 4.8 09/05/2018   HGB 12.4 (L) 09/05/2018   HCT 39.0 09/05/2018   PLT 175 09/05/2018   LYMPHOPCT 30 09/05/2018   MONOPCT 12 09/05/2018   EOSPCT 0 09/05/2018    BASOPCT 0 09/05/2018    CMP:  Lab Results  Component Value Date   NA 140 09/07/2018   K 4.3 09/07/2018   CL 104 09/07/2018   CO2 27 09/07/2018   BUN 24 (H) 09/07/2018   CREATININE 1.71 (H) 09/07/2018   CREATININE 1.21 (H) 03/12/2015   PROT 6.5 09/05/2018   ALBUMIN 3.7 09/05/2018   BILITOT 0.9 09/05/2018   ALKPHOS 56 09/05/2018   AST 33 09/05/2018   ALT 20 09/05/2018  .   Total Time in preparing paper work, data evaluation and todays exam - 64 minutes  Lala Lund M.D on 09/07/2018 at 8:53 AM  [facility]   Office  [phone]

## 2018-09-07 NOTE — Progress Notes (Signed)
PT Cancellation Note  Patient Details Name: Andres [name] MRN: [id number] DOB: [birth year]   Cancelled Treatment:    Reason Eval/Treat Not Completed: (P) (Pt reports he is ready to d/c and has no concerns with mobility to address before d/c home.  Pt educated on the benefits and he remains to decline.  )   [name] J [name] 09/07/2018, 12:41 PM  Governor Rooks, PTA Acute Rehabilitation Services Pager [phone] Office [phone]

## 2018-09-08 ENCOUNTER — Telehealth: Payer: Self-pay | Admitting: *Deleted

## 2018-09-08 NOTE — Telephone Encounter (Signed)
Transition Care Management Follow-up Telephone Call   Date discharged? 09/07/18   How have you been since you were released from the hospital? Pt states he is doing alright   Do you understand why you were in the hospital? YES   Do you understand the discharge instructions? YES   Where were you discharged to? Home   Items Reviewed:  Medications reviewed: YES, he states there wasn't any changes to his medications   Allergies reviewed: YES  Dietary changes reviewed: YES, heart healthy and low carb diet  Referrals reviewed: No referral needed   Functional Questionnaire:   Activities of Daily Living (ADLs):   He states he are independent in the following: bathing and hygiene, feeding, continence, grooming, toileting and dressing States he require assistance with the following: ambulation, have a cane   Any transportation issues/concerns?: NO   Any patient concerns? NO   Confirmed importance and date/time of follow-up visits scheduled YES, appt 09/09/18  Provider Appointment booked with Dr, Jenny Reichmann  Confirmed with patient if condition begins to worsen call PCP or go to the ER.  Patient was given the office number and encouraged to call back with question or concerns.  : YES

## 2018-09-09 ENCOUNTER — Encounter: Payer: Self-pay | Admitting: Internal Medicine

## 2018-09-09 ENCOUNTER — Other Ambulatory Visit (INDEPENDENT_AMBULATORY_CARE_PROVIDER_SITE_OTHER): Payer: Medicare HMO

## 2018-09-09 ENCOUNTER — Ambulatory Visit (INDEPENDENT_AMBULATORY_CARE_PROVIDER_SITE_OTHER): Payer: Medicare HMO | Admitting: Internal Medicine

## 2018-09-09 VITALS — BP 122/78 | HR 72 | Temp 97.7°F | Ht 65.0 in | Wt 236.0 lb

## 2018-09-09 DIAGNOSIS — E119 Type 2 diabetes mellitus without complications: Secondary | ICD-10-CM | POA: Diagnosis not present

## 2018-09-09 DIAGNOSIS — R55 Syncope and collapse: Secondary | ICD-10-CM

## 2018-09-09 DIAGNOSIS — I10 Essential (primary) hypertension: Secondary | ICD-10-CM

## 2018-09-09 LAB — CBC WITH DIFFERENTIAL/PLATELET
Basophils Absolute: 0 10*3/uL (ref 0.0–0.1)
Basophils Relative: 0.3 % (ref 0.0–3.0)
Eosinophils Absolute: 0.1 10*3/uL (ref 0.0–0.7)
Eosinophils Relative: 1.7 % (ref 0.0–5.0)
HCT: 38.1 % — ABNORMAL LOW (ref 39.0–52.0)
Hemoglobin: 12.8 g/dL — ABNORMAL LOW (ref 13.0–17.0)
Lymphocytes Relative: 45.7 % (ref 12.0–46.0)
Lymphs Abs: 2.2 10*3/uL (ref 0.7–4.0)
MCHC: 33.6 g/dL (ref 30.0–36.0)
MCV: 99.2 fl (ref 78.0–100.0)
Monocytes Absolute: 0.4 10*3/uL (ref 0.1–1.0)
Monocytes Relative: 8.2 % (ref 3.0–12.0)
NEUTROS ABS: 2.1 10*3/uL (ref 1.4–7.7)
Neutrophils Relative %: 44.1 % (ref 43.0–77.0)
Platelets: 230 10*3/uL (ref 150.0–400.0)
RBC: 3.84 Mil/uL — ABNORMAL LOW (ref 4.22–5.81)
RDW: 13.6 % (ref 11.5–15.5)
WBC: 4.7 10*3/uL (ref 4.0–10.5)

## 2018-09-09 LAB — BASIC METABOLIC PANEL
BUN: 22 mg/dL (ref 6–23)
CALCIUM: 9 mg/dL (ref 8.4–10.5)
CO2: 32 mEq/L (ref 19–32)
Chloride: 104 mEq/L (ref 96–112)
Creatinine, Ser: 1.42 mg/dL (ref 0.40–1.50)
GFR: 58.07 mL/min — ABNORMAL LOW (ref >60.00)
Glucose, Bld: 86 mg/dL (ref 70–99)
Potassium: 4.2 mEq/L (ref 3.5–5.1)
Sodium: 141 mEq/L (ref 135–145)

## 2018-09-09 LAB — HEMOGLOBIN A1C: Hgb A1c MFr Bld: 6.4 % (ref 4.6–6.5)

## 2018-09-09 NOTE — Assessment & Plan Note (Signed)
stable overall by history and exam, recent data reviewed with pt, and pt to continue medical treatment as before,  to f/u any worsening symptoms or concerns Lab Results  Component Value Date   HGBA1C 5.6 06/09/2018

## 2018-09-09 NOTE — Assessment & Plan Note (Signed)
With possible siezure like activity, to cont keppra, no driving x 6 mo, and refer neurology

## 2018-09-09 NOTE — Progress Notes (Signed)
Subjective:    Patient ID: Andres [name], male    DOB: [birth year], 78 y.o.   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 Patient the does not reports a loss of consciousness. The episode was witnessed with possible seizure activity with incontinence of bowel and bladder. No aura or postictal symptoms. Patient had a similar episode 3 weeks ago but he did not seek any medical attention.Seen by neurology, currently placed on Keppra, CT scan brain, MRI brain were nonacute, EEG was nonspecific but no seizure focus.  Echocardiogram showed a preserved EF of 60% without any suspicious findings for CVA.  He is back to his baseline, will be continued on Keppra with outpatient neurology follow-up, written instructions given not to drive till cleared by neurology, home PT ordered as well along with a rolling walker, and was discharged home.   Today - Pt denies chest pain, increased sob or doe, wheezing, orthopnea, PND, increased LE swelling, palpitations, dizziness or syncope.  No further siezure likely activity.  2/2 cxr neg for acute.  Not clear why d/c MD recommended f/u cxr.  Also recommended for f/u cbc/bmp, last cr 1.71.  Needs referral to neurology Past Medical History:  Diagnosis Date  . ALLERGIC RHINITIS   . Benign prostatic hypertrophy   . 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 Last nuc 2012 negative. 07/21/2013  . Carpal tunnel syndrome, bilateral   . Cataracts, bilateral   . Coronary artery disease    Dr. Claiborne Billings; 2D ECHO, 12/11/2011 - EF 50-55%, normal; NUCLEAR STRESS TEST, 10/16/2010 - no evidemce of inducible ischemia  . Diabetes mellitus type II   . Diverticulosis of colon   . GERD (gastroesophageal reflux disease)   . H/O hiatal hernia   .  Head mass   . Hepatitis   . Hx of   sessile serrated colonic polyp 02/27/2015  . Hyperlipidemia   . Hypertension   . Inguinal hernia, Rt reduced, Lt present 07/21/2013  . Low back pain   . Mitral valve prolapse 11/24/2013   By echo dec 2014  . Nephrolithiasis   . Obesity, morbid ([place])   . Occlusion and stenosis of carotid artery without mention of cerebral infarction    CAROTID DOPPLER, 03/18/2012 - srable, mild, hard, plaque noted, bilaterally  . Stroke [facility])    Past Surgical History:  Procedure Laterality Date  . APPENDECTOMY    . CARDIAC CATHETERIZATION    . CATARACT EXTRACTION Right   . CORONARY ARTERY BYPASS GRAFT    . INGUINAL HERNIA REPAIR Right 07/26/2013   Procedure: REPAIR  INCARCERATED RIGHT INGUINAL  HERNIA ;  Surgeon: Gwenyth Ober, MD;  Location: [facility];  Service: General;  Laterality: Right;    reports that he quit smoking about 28 years ago. His smoking use included cigarettes. He has never used smokeless tobacco. He reports that he does not drink alcohol or use drugs. family history includes Alzheimer's disease in his father; Diabetes in his brother, mother, and sister; Heart attack in his brother, maternal grandmother, and mother; Hypertension in his maternal grandmother and son; Kidney disease in his brother and son; Pancreatic cancer in his brother. No Known Allergies Current Outpatient Medications on File Prior to Visit  Medication Sig Dispense Refill  .  Acetaminophen 500 MG coapsule Take 500 mg by mouth every 6 (six) hours as needed for mild pain. Reported on 07/21/2015    . Ascorbic Acid (VITAMIN C) 1000 MG tablet Take 1,000 mg by mouth daily.    . Aspirin (ADULT ASPIRIN LOW STRENGTH) 81 MG EC tablet Take 81 mg by mouth daily.      Marland Kitchen atorvastatin (LIPITOR) 40 MG tablet Take 1 tablet (40 mg total) by mouth daily. 90 tablet 3  . clopidogrel (PLAVIX) 75 MG tablet Take 1 tablet (75 mg total) by mouth daily. 90 tablet 3  . diclofenac sodium (VOLTAREN) 1 % GEL Apply 2 g  topically every 6 (six) hours as needed (for arthritis). 295 g 3  . folic acid (FOLVITE) 1 MG tablet Take 1 tablet (1 mg total) by mouth daily. 90 tablet 3  . levETIRAcetam (KEPPRA) 500 MG tablet Take 1 tablet (500 mg total) by mouth 2 (two) times daily. 60 tablet 0  . losartan-hydrochlorothiazide (HYZAAR) 50-12.5 MG tablet Take 1 tablet by mouth daily. 90 tablet 3  . metoprolol tartrate (LOPRESSOR) 50 MG tablet Take 1 tablet (50 mg total) by mouth 2 (two) times daily. 180 tablet 3  . Multiple Vitamin (MULTIVITAMIN) capsule Take 1 capsule by mouth daily.     . Omega-3 Fatty Acids (OMEGA 3 PO) Take 360 mg by mouth 2 (two) times daily.    . tamsulosin (FLOMAX) 0.4 MG CAPS capsule Take 1 capsule (0.4 mg total) by mouth daily. 90 capsule 3   No current facility-administered medications on file prior to visit.    Review of Systems  Constitutional: Negative for other unusual diaphoresis or sweats HENT: Negative for ear discharge or swelling Eyes: Negative for other worsening visual disturbances Respiratory: Negative for stridor or other swelling  Gastrointestinal: Negative for worsening distension or other blood Genitourinary: Negative for retention or other urinary change Musculoskeletal: Negative for other MSK pain or swelling Skin: Negative for color change or other new lesions Neurological: Negative for worsening tremors and other numbness  Psychiatric/Behavioral: Negative for worsening agitation or other fatigue All other system neg per pt    Objective:   Physical Exam BP 122/78   Pulse 72   Temp 97.7 F (36.5 C) (Oral)   Ht 5\' 5"  (1.651 m)   Wt 236 lb (107 kg)   SpO2 97%   BMI 39.27 kg/m  VS noted,  Constitutional: Pt appears in NAD HENT: Head: NCAT.  Right Ear: External ear normal.  Left Ear: External ear normal.  Eyes: . Pupils are equal, round, and reactive to light. Conjunctivae and EOM are normal Nose: without d/c or deformity Neck: Neck supple. Gross normal  ROM Cardiovascular: Normal rate and regular rhythm.   Pulmonary/Chest: Effort normal and breath sounds without rales or wheezing.  Abd:  Soft, NT, ND, + BS, no organomegaly Neurological: Pt is alert. At baseline orientation, motor grossly intact Skin: Skin is warm. No rashes, other new lesions, no LE edema Psychiatric: Pt behavior is normal without agitation  No other exam findings  Lab Results  Component Value Date   WBC 4.8 09/05/2018   HGB 12.4 (L) 09/05/2018   HCT 39.0 09/05/2018   PLT 175 09/05/2018   GLUCOSE 93 09/07/2018   CHOL 126 12/02/2017   TRIG 97.0 12/02/2017   HDL 38.50 (L) 12/02/2017   LDLCALC 68 12/02/2017   ALT 20 09/05/2018   AST 33 09/05/2018   NA 140 09/07/2018   K 4.3 09/07/2018   CL 104 09/07/2018  CREATININE 1.71 (H) 09/07/2018   BUN 24 (H) 09/07/2018   CO2 27 09/07/2018   TSH 0.75 12/02/2017   PSA 0.63 12/02/2017   INR 0.97 09/05/2018   HGBA1C 5.6 06/09/2018   MICROALBUR 0.7 12/02/2017        Assessment & Plan:

## 2018-09-09 NOTE — Patient Instructions (Signed)
Please continue all other medications as before, and refills have been done if requested.  Please have the pharmacy call with any other refills you may need.  Please continue your efforts at being more active, low cholesterol diet, and weight control.  You are otherwise up to date with prevention measures today.  Please keep your appointments with your specialists as you may have planned  You will be contacted regarding the referral for: Neurology  No driving for 6 months, unless you are cleared by neurology for this  Please go to the LAB in the Basement (turn left off the elevator) for the tests to be done today  You will be contacted by phone if any changes need to be made immediately.  Otherwise, you will receive a letter about your results with an explanation, but please check with MyChart first.  Please remember to sign up for MyChart if you have not done so, as this will be important to you in the future with finding out test results, communicating by private email, and scheduling acute appointments online when needed.

## 2018-09-09 NOTE — Assessment & Plan Note (Signed)
stable overall by history and exam, recent data reviewed with pt, and pt to continue medical treatment as before,  to f/u any worsening symptoms or concern, for f/u lab today 

## 2018-09-13 ENCOUNTER — Encounter: Payer: Self-pay | Admitting: Neurology

## 2018-09-14 ENCOUNTER — Telehealth: Payer: Self-pay

## 2018-09-14 NOTE — Telephone Encounter (Signed)
Left msg for pt to call me back at [phone]

## 2018-09-14 NOTE — Telephone Encounter (Signed)
Copied from [place] [id number] [id number]. Topic: Referral - Question >> Sep 13, 2018  5:33 PM Rothrock, Lanice Schwab wrote: Patient called this morning prior to 12:00pm and I do not see previous CRM.  Mr. Espin was scheduled a Neuro appointment for 11/17/18.  He is very concerned that he has to wait that long to see Neuro and states that he has questions related to this referral.

## 2018-09-16 ENCOUNTER — Encounter: Payer: Self-pay | Admitting: Neurology

## 2018-09-16 ENCOUNTER — Ambulatory Visit: Payer: Medicare HMO | Admitting: Neurology

## 2018-09-16 VITALS — BP 128/66 | HR 58 | Ht 65.5 in | Wt 244.0 lb

## 2018-09-16 DIAGNOSIS — I6930 Unspecified sequelae of cerebral infarction: Secondary | ICD-10-CM | POA: Diagnosis not present

## 2018-09-16 DIAGNOSIS — E669 Obesity, unspecified: Secondary | ICD-10-CM | POA: Diagnosis not present

## 2018-09-16 DIAGNOSIS — R55 Syncope and collapse: Secondary | ICD-10-CM | POA: Diagnosis not present

## 2018-09-16 DIAGNOSIS — G40909 Epilepsy, unspecified, not intractable, without status epilepticus: Secondary | ICD-10-CM | POA: Diagnosis not present

## 2018-09-16 DIAGNOSIS — R351 Nocturia: Secondary | ICD-10-CM | POA: Diagnosis not present

## 2018-09-16 MED ORDER — LEVETIRACETAM 500 MG PO TABS: 500.0000 mg | ORAL_TABLET | Freq: Two times a day (BID) | ORAL | 3 refills | Status: DC

## 2018-09-16 NOTE — Progress Notes (Signed)
Faxed keppra to [facility] per pt request.

## 2018-09-16 NOTE — Progress Notes (Addendum)
Subjective:    Patient ID: Andres [name] is a 78 y.o. male.  HPI     Star Age, MD, PhD [facility] [address], [address] [place],  [address]  Dear Dr. Jenny Reichmann,  I saw your patient, Andres [name], upon your kind request in my neurologic clinic today for initial consultation of his recent syncope and collapse and concern for seizure. The patient is unaccompanied today. As you know, 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 He may not have lost full consciousness but there was suspicion for seizure-like activity with bowel or bladder incontinence reported. He was admitted on 09/05/2018 and discharged on 09/07/2018. I reviewed the hospital records including discharge summary. He had a brain MRI without contrast on 09/06/2018 and I reviewed the results: IMPRESSION: 1. No acute intracranial abnormality. 2. Chronic right posterior MCA distribution infarction. Moderate chronic microvascular ischemic changes and mild volume loss of the brain.  He had an EEG on 09/06/2018 and I reviewed the results: IMPRESSION: 1.  This recording of the awake and drowsy states shows mild global slowing indicating a mild global encephalopathy.  However, there is no evidence of epileptiform discharges or focal slowing.     He had extensive labs on 09/05/2018. UDS was negative, ethanol level negative. His latest A1c on 09/09/2018 was 6.4. Echocardiogram on 09/06/2018 showed EF of 60-65% and otherwise benign findings altogether. He was started on Keppra during his hospitalization for suspicion of  seizure activity.  I reviewed your office note from 09/09/2018. He has been told not to drive for 6 months. Of note, the patient drove himself to this appointment. He does not think he snores, sleep fairly well, never had a sleep study, ESS of 2/24. He denies morning headaches. He denies a family history of epilepsy or prior history of seizures. His bedtime is 10:00, rise time around 5:30. He has nocturia about twice per average night. He quit smoking and drinking alcohol in 1992. He drinks caffeine limitation, typically 1 cup of coffee per day. He tries to hydrate well. He lives with his son, he is widowed since 29. His oldest son passed away, he also has one daughter.  His Past Medical History Is Significant For: Past Medical History:  Diagnosis Date  . ALLERGIC RHINITIS   . Benign prostatic hypertrophy   . 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 Last nuc 2012 negative. 07/21/2013  . Carpal tunnel syndrome, bilateral   . Cataracts, bilateral   . Coronary artery disease    Dr. Claiborne Billings; 2D ECHO, 12/11/2011 - EF 50-55%, normal; NUCLEAR STRESS TEST, 10/16/2010 - no evidemce of inducible ischemia  . Diabetes mellitus type II   . Diverticulosis of colon   . GERD (gastroesophageal reflux disease)   . H/O hiatal hernia   . Head mass   . Hepatitis   . Hx of   sessile serrated colonic polyp 02/27/2015  . Hyperlipidemia   . Hypertension   . Inguinal hernia, Rt reduced, Lt present 07/21/2013  . Low back pain   . Mitral valve prolapse 11/24/2013   By echo dec 2014  . Nephrolithiasis   . Obesity, morbid (  [place])   . Occlusion and stenosis of carotid artery without mention of cerebral infarction    CAROTID DOPPLER, 03/18/2012 - srable, mild, hard, plaque noted, bilaterally  . Stroke [facility])     His Past Surgical History Is Significant For: Past Surgical History:  Procedure Laterality Date  . APPENDECTOMY    . CARDIAC CATHETERIZATION    . CATARACT EXTRACTION Right   . CORONARY ARTERY  BYPASS GRAFT    . INGUINAL HERNIA REPAIR Right 07/26/2013   Procedure: REPAIR  INCARCERATED RIGHT INGUINAL  HERNIA ;  Surgeon: Gwenyth Ober, MD;  Location: [facility];  Service: General;  Laterality: Right;    His Family History Is Significant For: Family History  Problem Relation Age of Onset  . Heart attack Mother   . Diabetes Mother   . Heart attack Brother   . Heart attack Maternal Grandmother   . Hypertension Maternal Grandmother   . Pancreatic cancer Brother   . Kidney disease Brother   . Hypertension Son   . Kidney disease Son        ?  Marland Kitchen Alzheimer's disease Father   . Diabetes Brother   . Diabetes Sister     His Social History Is Significant For: Social History   Socioeconomic History  . Marital status: Widowed    Spouse name: widowed  . Number of children: 3  . Years of education: Not on file  . Highest education level: Not on file  Occupational History  . Occupation: retired    Fish farm manager: [employer]  . Financial resource strain: Not on file  . Food insecurity:    Worry: Not on file    Inability: Not on file  . Transportation needs:    Medical: Not on file    Non-medical: Not on file  Tobacco Use  . Smoking status: Former Smoker    Types: Cigarettes    Last attempt to quit: 12/20/1989    Years since quitting: 28.7  . Smokeless tobacco: Never Used  Substance and Sexual Activity  . Alcohol use: No    Alcohol/week: 0.0 standard drinks  . Drug use: No  . Sexual activity: Not on file  Lifestyle  . Physical activity:    Days per week: Not on file    Minutes per session: Not on file  . Stress: Not on file  Relationships  . Social connections:    Talks on phone: Not on file    Gets together: Not on file    Attends religious service: Not on file    Active member of club or organization: Not on file    Attends meetings of clubs or organizations: Not on file    Relationship status: Not on file  Other Topics Concern  . Not on file  Social History  Narrative  . Not on file    His Allergies Are:  No Known Allergies:   His Current Medications Are:  Outpatient Encounter Medications as of 09/16/2018  Medication Sig  . Acetaminophen 500 MG coapsule Take 500 mg by mouth every 6 (six) hours as needed for mild pain. Reported on 07/21/2015  . Ascorbic Acid (VITAMIN C) 1000 MG tablet Take 1,000 mg by mouth daily.  . Aspirin (ADULT ASPIRIN LOW STRENGTH) 81 MG EC tablet Take 81 mg by mouth daily.    Marland Kitchen atorvastatin (LIPITOR) 40 MG tablet Take 1 tablet (40 mg total) by mouth daily.  . clopidogrel (PLAVIX) 75 MG tablet Take 1 tablet (75 mg total) by  mouth daily.  . diclofenac sodium (VOLTAREN) 1 % GEL Apply 2 g topically every 6 (six) hours as needed (for arthritis).  . folic acid (FOLVITE) 1 MG tablet Take 1 tablet (1 mg total) by mouth daily.  Marland Kitchen levETIRAcetam (KEPPRA) 500 MG tablet Take 1 tablet (500 mg total) by mouth 2 (two) times daily.  Marland Kitchen losartan-hydrochlorothiazide (HYZAAR) 50-12.5 MG tablet Take 1 tablet by mouth daily.  . metoprolol tartrate (LOPRESSOR) 50 MG tablet Take 1 tablet (50 mg total) by mouth 2 (two) times daily.  . Multiple Vitamin (MULTIVITAMIN) capsule Take 1 capsule by mouth daily.   . Omega-3 Fatty Acids (OMEGA 3 PO) Take 360 mg by mouth 2 (two) times daily.  . tamsulosin (FLOMAX) 0.4 MG CAPS capsule Take 1 capsule (0.4 mg total) by mouth daily.   No facility-administered encounter medications on file as of 09/16/2018.   :   Review of Systems:  Out of a complete 14 point review of systems, all are reviewed and negative with the exception of these symptoms as listed below:  Review of Systems  Neurological:       Pt presents today to discuss a syncopal episode. He doesn't remember what happened. Pt reports that he drinks enough water and does not have trouble sleeping. Pt denies being dizzy. Pt does not think he snores. Pt has never had a sleep study.  Epworth Sleepiness Scale 0= would never doze 1= slight chance of  dozing 2= moderate chance of dozing 3= high chance of dozing  Sitting and reading: 0 Watching TV: 1 Sitting inactive in a public place (ex. Theater or meeting): 0 As a passenger in a car for an hour without a break: 0 Lying down to rest in the afternoon: 1 Sitting and talking to someone: 0 Sitting quietly after lunch (no alcohol): 0 In a car, while stopped in traffic: 0 Total: 2     Objective:  Neurological Exam  Physical Exam Physical Examination:   Vitals:   09/16/18 0832  BP: 128/66  Pulse: (!) 58   On orthostatic testing: Lying blood pressure and pulse was 114/65 with a pulse of 59, sitting: 128/66 with a pulse of 58, standing: 130/66 with a pulse of 57.  General Examination: The patient is a very pleasant 78 y.o. male in no acute distress. He appears well-developed and well-nourished and well groomed.   HEENT: Normocephalic, atraumatic, pupils are equal, round and reactive to light and accommodation. Extraocular tracking is good without limitation to gaze excursion or nystagmus noted. Hearing is grossly intact. Face is symmetric, speech slightly dysarthric. Airway examination reveals moderate to significant airway crowding with a thicker soft palate, large uvula and tonsils in place. He has partial plates on top and bottom. Tongue protrudes centrally and palate elevates symmetrically. Mallampati is class III. Neck circumference is 17-5/8 inches.  Chest: Clear to auscultation without wheezing, rhonchi or crackles noted.  Heart: S1+S2+0, regular and normal without murmurs, rubs or gallops noted.   Abdomen: Soft, non-tender and non-distended with normal bowel sounds appreciated on auscultation.  Extremities: There is trace to 1+ pitting edema in the distal lower extremities bilaterally, right a little more noticeable than left.  Skin: Warm and dry without trophic changes noted.  Musculoskeletal: exam reveals no obvious joint deformities, tenderness or joint swelling or  erythema.   Neurologically:  Mental status: The patient is awake, alert and oriented in all 4 spheres. His immediate and remote memory, attention, language skills and fund of knowledge are mildly impaired.  He is not able to give a very elaborate and detailed history of is not sure about certain dates, as far as his past medical history is concerned for example. Speech is mildly dysarthric. Mild slowness in thinking is noted. Mood is normal and affect is normal.  Cranial nerves II - XII are as described above under HEENT exam. In addition: shoulder shrug is normal with equal shoulder height noted. Motor exam: Normal bulk, strength and tone is noted. There is no drift, tremor or rebound. Romberg is negative. Reflexes are 1-2+ throughout, but absent in the ankles. Fine motor skills are mildly globally impaired. Cerebellar testing: No dysmetria or intention tremor. There is no truncal or gait ataxia.  Sensory exam: intact to light touch.  Gait, station and balance: He stands with mild difficulty, stands naturally a little wide-based. He walks with preserved arm swing, no limp.  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 He was admitted to the hospital and had stroke and seizure workup. He was suspected to have had a seizure-like event and placed on Keppra. He is advised that his biggest seizure-like risk factor is his prior stroke. We talked about seizure precautions and the importance of no driving for 6 months was reiterated. He did drive himself to this appointment today. I suggested  he continue with Keppra at the current dose of 500 mg twice a day generic. I renewed his prescription for this today for 90 days with refills and sent to to his preferred pharmacy. As far as diagnostic testing, the only test I would add is a sleep study to rule out obstructive sleep apnea as he appears to be at risk for this including larger next conference, elevated BMI, and crowded airway. Especially in light of his prior stroke and heart disease, it would be important to rule out obstructive sleep apnea and if found he should be treated with positive airway pressure. I explained CPAP therapy to him today. He is willing to come in for sleep study. We will call him to schedule his studies soon. I will see him back routinely in 3 months, sooner if needed. I answered all his questions today and he was in agreement. Thank you very much for allowing me to participate in the care of this nice patient. If I can be of any further assistance to you please do not hesitate to call me at [phone].  Sincerely,   Star Age, MD, PhD

## 2018-09-16 NOTE — Patient Instructions (Signed)
There is a chance you had a seizure event earlier this month. Your main risk factor for seizures is your prior stroke.   Please continue with the Keppra 500 mg 2 times a day.   We will do a sleep study to rule out sleep apnea.   Please remember, common seizure triggers are: Sleep deprivation, dehydration, overheating, stress, hypoglycemia or skipping meals, certain medications or excessive alcohol use, especially stopping alcohol abruptly if you have had heavy alcohol use before (aka alcohol withdrawal seizure). If you have a prolonged seizure over 2-5 minutes or back to back seizures, call or have someone call 911 or take you to the nearest emergency room. You cannot drive a car or operate any other machinery or vehicle within 6 months of a seizure. Please do not swim alone or take a tub bath for safety. Do not climb on ladders or be at heights alone. Do not cook with large quantities of boiling water or oil for safety. Please ensure the water temperature at home is not too high. When carrying or caring for small children and infants, make sure you sit down when holding the child are feeding the child or changing them to minimize risk for injury to the child are to you if you were to have a seizure.  Take your medicine for seizure prevention regularly and do not skip doses or stop medication abruptly and tone are told to do so by your healthcare provider. Try to get a refill on your antiepileptic medication ahead of time, so you are not at risk of running out. If you run out of the seizure medication and do not have a refill at hand she may run into medication withdrawal seizures. Avoid taking Wellbutrin, narcotic pain medications and tramadol, as they can lower seizure threshold.   As per [facility] DMP statutes, patients with seizures and epilepsy are not allowed to drive until they have been seizure-free for at least 6 months. This also applies to driving or using heavy equipment or power  tools.

## 2018-10-04 ENCOUNTER — Telehealth: Payer: Self-pay | Admitting: Internal Medicine

## 2018-10-04 MED ORDER — HYDROCHLOROTHIAZIDE 12.5 MG PO CAPS: 12.5000 mg | ORAL_CAPSULE | Freq: Every day | ORAL | 3 refills | Status: DC

## 2018-10-04 MED ORDER — LOSARTAN POTASSIUM 50 MG PO TABS: 50.0000 mg | ORAL_TABLET | Freq: Every day | ORAL | 3 refills | Status: DC

## 2018-10-04 NOTE — Telephone Encounter (Signed)
Copied from [place] [id number]. Topic: Quick Communication - See Telephone Encounter >> Oct 04, 2018 11:28 AM Bea Graff, NT wrote: CRM for notification. See Telephone encounter for: 10/04/18. Bree with [facility] states the losartan-hydrochlorothiazide (HYZAAR) 50-12.5 MG tablet is on back order and they would like to verify it will be ok to separate the medication. CB#: [phone] Reference #: [id number]

## 2018-10-04 NOTE — Telephone Encounter (Signed)
Ok this is done erx 

## 2018-10-04 NOTE — Addendum Note (Signed)
Addended by: [name] on: 10/04/2018 12:44 PM   Modules accepted: Orders

## 2018-10-27 ENCOUNTER — Ambulatory Visit: Payer: Medicare HMO | Admitting: Podiatry

## 2018-11-17 ENCOUNTER — Encounter

## 2018-11-17 ENCOUNTER — Ambulatory Visit: Payer: Medicare HMO | Admitting: Neurology

## 2018-12-08 ENCOUNTER — Other Ambulatory Visit (INDEPENDENT_AMBULATORY_CARE_PROVIDER_SITE_OTHER): Payer: Medicare HMO

## 2018-12-08 ENCOUNTER — Encounter: Payer: Self-pay | Admitting: Internal Medicine

## 2018-12-08 ENCOUNTER — Other Ambulatory Visit: Payer: Self-pay

## 2018-12-08 ENCOUNTER — Ambulatory Visit (INDEPENDENT_AMBULATORY_CARE_PROVIDER_SITE_OTHER): Payer: Medicare HMO | Admitting: Internal Medicine

## 2018-12-08 VITALS — BP 124/82 | HR 64 | Temp 98.0°F | Ht 65.5 in | Wt 239.0 lb

## 2018-12-08 DIAGNOSIS — Z0000 Encounter for general adult medical examination without abnormal findings: Secondary | ICD-10-CM

## 2018-12-08 DIAGNOSIS — E119 Type 2 diabetes mellitus without complications: Secondary | ICD-10-CM

## 2018-12-08 LAB — HEPATIC FUNCTION PANEL
ALT: 17 U/L (ref 0–53)
AST: 25 U/L (ref 0–37)
Albumin: 4.3 g/dL (ref 3.5–5.2)
Alkaline Phosphatase: 70 U/L (ref 39–117)
Bilirubin, Direct: 0.1 mg/dL (ref 0.0–0.3)
Total Bilirubin: 0.9 mg/dL (ref 0.2–1.2)
Total Protein: 6.6 g/dL (ref 6.0–8.3)

## 2018-12-08 LAB — LIPID PANEL
Cholesterol: 126 mg/dL (ref 0–200)
HDL: 33.2 mg/dL — ABNORMAL LOW (ref >39.00)
LDL Cholesterol: 62 mg/dL (ref 0–99)
NonHDL: 92.84
Total CHOL/HDL Ratio: 4
Triglycerides: 154 mg/dL — ABNORMAL HIGH (ref 0.0–149.0)
VLDL: 30.8 mg/dL (ref 0.0–40.0)

## 2018-12-08 LAB — URINALYSIS, ROUTINE W REFLEX MICROSCOPIC
Bilirubin Urine: NEGATIVE
Ketones, ur: NEGATIVE
Leukocytes,Ua: NEGATIVE
Nitrite: NEGATIVE
Specific Gravity, Urine: 1.02 (ref 1.000–1.030)
Total Protein, Urine: NEGATIVE
Urine Glucose: NEGATIVE
Urobilinogen, UA: 0.2 (ref 0.0–1.0)
WBC, UA: NONE SEEN (ref 0–?)
pH: 6 (ref 5.0–8.0)

## 2018-12-08 LAB — CBC WITH DIFFERENTIAL/PLATELET
Basophils Absolute: 0 10*3/uL (ref 0.0–0.1)
Basophils Relative: 0.4 % (ref 0.0–3.0)
Eosinophils Absolute: 0.1 10*3/uL (ref 0.0–0.7)
Eosinophils Relative: 1.8 % (ref 0.0–5.0)
HCT: 37.7 % — ABNORMAL LOW (ref 39.0–52.0)
Hemoglobin: 12.9 g/dL — ABNORMAL LOW (ref 13.0–17.0)
Lymphocytes Relative: 46 % (ref 12.0–46.0)
Lymphs Abs: 1.8 10*3/uL (ref 0.7–4.0)
MCHC: 34.1 g/dL (ref 30.0–36.0)
MCV: 99.8 fl (ref 78.0–100.0)
Monocytes Absolute: 0.3 10*3/uL (ref 0.1–1.0)
Monocytes Relative: 7.7 % (ref 3.0–12.0)
Neutro Abs: 1.8 10*3/uL (ref 1.4–7.7)
Neutrophils Relative %: 44.1 % (ref 43.0–77.0)
Platelets: 152 10*3/uL (ref 150.0–400.0)
RBC: 3.78 Mil/uL — ABNORMAL LOW (ref 4.22–5.81)
RDW: 14.6 % (ref 11.5–15.5)
WBC: 4 10*3/uL (ref 4.0–10.5)

## 2018-12-08 LAB — BASIC METABOLIC PANEL
BUN: 16 mg/dL (ref 6–23)
CO2: 31 mEq/L (ref 19–32)
Calcium: 8.9 mg/dL (ref 8.4–10.5)
Chloride: 106 mEq/L (ref 96–112)
Creatinine, Ser: 1.19 mg/dL (ref 0.40–1.50)
GFR: 71.16 mL/min (ref >60.00)
Glucose, Bld: 104 mg/dL — ABNORMAL HIGH (ref 70–99)
Potassium: 4 mEq/L (ref 3.5–5.1)
Sodium: 143 mEq/L (ref 135–145)

## 2018-12-08 LAB — MICROALBUMIN / CREATININE URINE RATIO
Creatinine,U: 114.9 mg/dL
Microalb Creat Ratio: 2.1 mg/g (ref 0.0–30.0)
Microalb, Ur: 2.4 mg/dL — ABNORMAL HIGH (ref 0.0–1.9)

## 2018-12-08 LAB — TSH: TSH: 0.82 u[IU]/mL (ref 0.35–4.50)

## 2018-12-08 LAB — HEMOGLOBIN A1C: Hgb A1c MFr Bld: 6.6 % — ABNORMAL HIGH (ref 4.6–6.5)

## 2018-12-08 NOTE — Assessment & Plan Note (Signed)

## 2018-12-08 NOTE — Progress Notes (Signed)
Subjective:    Patient ID: Andres [name], male    DOB: July 27, 1941, 78 y.o.   MRN: [id number]  HPI  Here for wellness and f/u;  Overall doing ok;  Pt denies Chest pain, worsening SOB, DOE, wheezing, orthopnea, PND, worsening LE edema, palpitations, dizziness or syncope.  Pt denies neurological change such as new headache, facial or extremity weakness.  Pt denies polydipsia, polyuria, or low sugar symptoms. Pt states overall good compliance with treatment and medications, good tolerability, and has been trying to follow appropriate diet.  Pt denies worsening depressive symptoms, suicidal ideation or panic. No fever, night sweats, wt loss, loss of appetite, or other constitutional symptoms.  Pt states good ability with ADL's, has low fall risk, home safety reviewed and adequate, no other significant changes in hearing or vision, and only occasionally active with exercise.[place] [facility] [place] but unable during the pandemic;;  Still has lost 5 lbs with better diet Wt Readings from Last 3 Encounters:  12/08/18 239 lb (108.4 kg)  09/16/18 244 lb (110.7 kg)  09/09/18 236 lb (107 kg)   Past Medical History:  Diagnosis Date   ALLERGIC RHINITIS    Benign prostatic hypertrophy    CAD (coronary artery disease), hx of CABG 1997 X 6. Last nuc 2012 negative. 07/21/2013   Carpal tunnel syndrome, bilateral    Cataracts, bilateral    Coronary artery disease    Dr. 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[place])    Occlusion and stenosis of carotid artery without mention of cerebral infarction    CAROTID DOPPLER, 03/18/2012 - srable, mild, hard, plaque noted, bilaterally   Stroke [facility])    Past Surgical History:  Procedure Laterality Date   APPENDECTOMY     CARDIAC CATHETERIZATION     CATARACT EXTRACTION Right    CORONARY ARTERY BYPASS GRAFT     INGUINAL HERNIA REPAIR Right 07/26/2013   Procedure: REPAIR  INCARCERATED RIGHT INGUINAL  HERNIA ;  Surgeon: Gwenyth Ober, MD;  Location: [facility];  Service: General;  Laterality: Right;    reports that he quit smoking about 28 years ago. His smoking use included cigarettes. He has never used smokeless tobacco. He reports that he does not drink alcohol or use drugs. family history includes Alzheimer's disease in his father; Diabetes in his brother, mother, and sister; Heart attack in his brother, maternal grandmother, and mother; Hypertension in his maternal grandmother and son; Kidney disease in his brother and son; Pancreatic cancer in his brother. No Known Allergies Current Outpatient Medications on File Prior to Visit  Medication Sig Dispense Refill   Acetaminophen 500 MG coapsule Take 500 mg by mouth every 6 (six) hours as needed for mild pain. Reported on 07/21/2015     Ascorbic Acid (VITAMIN C) 1000 MG tablet Take 1,000 mg by mouth daily.     Aspirin (ADULT  ASPIRIN LOW STRENGTH) 81 MG EC tablet Take 81 mg by mouth daily.       atorvastatin (LIPITOR) 40 MG tablet Take 1 tablet (40 mg total) by mouth daily. 90 tablet 3   clopidogrel (PLAVIX) 75 MG tablet Take 1 tablet (75 mg total) by mouth daily. 90 tablet 3   diclofenac sodium (VOLTAREN) 1 % GEL Apply 2 g topically every 6 (six) hours as needed (for arthritis). 630 g 3   folic acid (FOLVITE) 1 MG tablet Take 1 tablet (1 mg total) by mouth daily. 90 tablet 3   hydrochlorothiazide (MICROZIDE) 12.5 MG capsule Take 1 capsule (12.5 mg total) by mouth daily. 90 capsule 3     levETIRAcetam (KEPPRA) 500 MG tablet Take 1 tablet (500 mg total) by mouth 2 (two) times daily. 180 tablet 3   losartan (COZAAR) 50 MG tablet Take 1 tablet (50 mg total) by mouth daily. 90 tablet 3   metoprolol tartrate (LOPRESSOR) 50 MG tablet Take 1 tablet (50 mg total) by mouth 2 (two) times daily. 180 tablet 3   Multiple Vitamin (MULTIVITAMIN) capsule Take 1 capsule by mouth daily.      Omega-3 Fatty Acids (OMEGA 3 PO) Take 360 mg by mouth 2 (two) times daily.     tamsulosin (FLOMAX) 0.4 MG CAPS capsule Take 1 capsule (0.4 mg total) by mouth daily. 90 capsule 3   No current facility-administered medications on file prior to visit.    Review of Systems Constitutional: Negative for other unusual diaphoresis, sweats, appetite or weight changes HENT: Negative for other worsening hearing loss, ear pain, facial swelling, mouth sores or neck stiffness.   Eyes: Negative for other worsening pain, redness or other visual disturbance.  Respiratory: Negative for other stridor or swelling Cardiovascular: Negative for other palpitations or other chest pain  Gastrointestinal: Negative for worsening diarrhea or loose stools, blood in stool, distention or other pain Genitourinary: Negative for hematuria, flank pain or other change in urine volume.  Musculoskeletal: Negative for myalgias or other joint swelling.  Skin: Negative for other color change, or other wound or worsening drainage.  Neurological: Negative for other syncope or numbness. Hematological: Negative for other adenopathy or swelling Psychiatric/Behavioral: Negative for hallucinations, other worsening agitation, SI, self-injury, or new decreased concentration All other system neg per pt    Objective:   Physical Exam BP 124/82    Pulse 64    Temp 98 F (36.7 C) (Oral)    Ht 5' 5.5" (1.664 m)    Wt 239 lb (108.4 kg)    SpO2 97%    BMI 39.17 kg/m  VS noted,  Constitutional: Pt is oriented to person, place, and time. Appears  well-developed and well-nourished, in no significant distress and comfortable Head: Normocephalic and atraumatic  Eyes: Conjunctivae and EOM are normal. Pupils are equal, round, and reactive to light Right Ear: External ear normal without discharge Left Ear: External ear normal without discharge Nose: Nose without discharge or deformity Mouth/Throat: Oropharynx is without other ulcerations and moist  Neck: Normal range of motion. Neck supple. No JVD present. No tracheal deviation present or significant neck LA or mass Cardiovascular: Normal rate, regular rhythm, normal heart sounds and intact distal pulses.   Pulmonary/Chest: WOB normal and breath sounds without rales or wheezing  Abdominal: Soft. Bowel sounds are normal. NT. No HSM  Musculoskeletal: Normal range of motion. Exhibits no edema Lymphadenopathy: Has no other cervical adenopathy.  Neurological: Pt is alert and oriented to person, place, and time. Pt  has normal reflexes. No cranial nerve deficit. Motor grossly intact, Gait intact Skin: Skin is warm and dry. No rash noted or new ulcerations Psychiatric:  Has normal mood and affect. Behavior is normal without agitation No other exam findings Lab Results  Component Value Date   WBC 4.7 09/09/2018   HGB 12.8 (L) 09/09/2018   HCT 38.1 (L) 09/09/2018   PLT 230.0 09/09/2018   GLUCOSE 86 09/09/2018   CHOL 126 12/02/2017   TRIG 97.0 12/02/2017   HDL 38.50 (L) 12/02/2017   LDLCALC 68 12/02/2017   ALT 20 09/05/2018   AST 33 09/05/2018   NA 141 09/09/2018   K 4.2 09/09/2018   CL 104 09/09/2018   CREATININE 1.42 09/09/2018   BUN 22 09/09/2018   CO2 32 09/09/2018   TSH 0.75 12/02/2017   PSA 0.63 12/02/2017   INR 0.97 09/05/2018   HGBA1C 6.4 09/09/2018   MICROALBUR 0.7 12/02/2017       Assessment & Plan:

## 2018-12-08 NOTE — Patient Instructions (Signed)

## 2018-12-08 NOTE — Assessment & Plan Note (Signed)
stable overall by history and exam, recent data reviewed with pt, and pt to continue medical treatment as before,  to f/u any worsening symptoms or concerns, for a1c with labs 

## 2018-12-09 ENCOUNTER — Ambulatory Visit: Payer: Medicare HMO | Admitting: Internal Medicine

## 2018-12-15 ENCOUNTER — Ambulatory Visit: Payer: Medicare HMO | Admitting: Neurology

## 2019-01-05 ENCOUNTER — Ambulatory Visit: Payer: Medicare HMO | Admitting: Podiatry

## 2019-01-05 ENCOUNTER — Encounter: Payer: Self-pay | Admitting: Podiatry

## 2019-01-05 ENCOUNTER — Other Ambulatory Visit: Payer: Self-pay

## 2019-01-05 VITALS — Temp 97.7°F

## 2019-01-05 DIAGNOSIS — M79676 Pain in unspecified toe(s): Secondary | ICD-10-CM

## 2019-01-05 DIAGNOSIS — B351 Tinea unguium: Secondary | ICD-10-CM

## 2019-01-05 DIAGNOSIS — D689 Coagulation defect, unspecified: Secondary | ICD-10-CM

## 2019-01-05 DIAGNOSIS — E1142 Type 2 diabetes mellitus with diabetic polyneuropathy: Secondary | ICD-10-CM

## 2019-01-05 NOTE — Progress Notes (Signed)
Patient ID: Andres [name], male   DOB: 1941-07-22, 78 y.o.   MRN: [id number] . Complaint:  Visit Type: Patient returns to my office for continued preventative foot care services. Complaint: Patient states" my nails have grown long and thick and become painful to walk and wear shoes" Patient has been diagnosed with DM with no foot complications. The patient presents for preventative foot care services. No changes to ROS.  Patient is taking plavix.  Podiatric Exam: Vascular: dorsalis pedis and posterior tibial pulses are palpable bilateral. Capillary return is immediate. Temperature gradient is WNL. Skin turgor WNL  Sensorium: Diminished  Semmes Weinstein monofilament test. Normal tactile sensation bilaterally. Nail Exam: Pt has thick disfigured discolored nails with subungual debris noted bilateral entire nail hallux through fifth toenails Ulcer Exam: There is no evidence of ulcer or pre-ulcerative changes or infection. Orthopedic Exam: Muscle tone and strength are WNL. No limitations in general ROM. No crepitus or effusions noted. Foot type and digits show no abnormalities.  HAV  B/L. Skin: No Porokeratosis. No infection or ulcers  Diagnosis:  Onychomycosis, , Pain in right toe, pain in left toes  Treatment & Plan Procedures and Treatment: Consent by patient was obtained for treatment procedures. The patient understood the discussion of treatment and procedures well. All questions were answered thoroughly reviewed. Debridement of mycotic and hypertrophic toenails, 1 through 5 bilateral and clearing of subungual debris. No ulceration, no infection noted.  Return Visit-Office Procedure: Patient instructed to return to the office for a follow up visit 10 weeks for continued evaluation and treatment   Gardiner Barefoot DPM

## 2019-01-24 DIAGNOSIS — Z0100 Encounter for examination of eyes and vision without abnormal findings: Secondary | ICD-10-CM | POA: Diagnosis not present

## 2019-01-25 ENCOUNTER — Telehealth: Payer: Self-pay

## 2019-01-25 NOTE — Telephone Encounter (Signed)
Yes, that should be OK, thanks

## 2019-01-25 NOTE — Telephone Encounter (Signed)
Copied from [place] [id number]. Topic: General - Other >> Jan 25, 2019  3:32 PM Celene Kras A wrote: Reason for CRM: Pt called and is requesting to know if he could get the okay to go swimming. Pts ED discharge notes state he needs approval from PCP to start swimming again. Pt states with the gyms closed he would like to start back swimming.  Please advise.

## 2019-01-26 NOTE — Telephone Encounter (Signed)
Called pt, LVM with details below  

## 2019-03-02 ENCOUNTER — Ambulatory Visit (INDEPENDENT_AMBULATORY_CARE_PROVIDER_SITE_OTHER)
Admission: RE | Admit: 2019-03-02 | Discharge: 2019-03-02 | Disposition: A | Discharge status: Home / Self Care | Payer: Medicare HMO | Source: Ambulatory Visit | Attending: Internal Medicine | Admitting: Internal Medicine

## 2019-03-02 ENCOUNTER — Other Ambulatory Visit: Payer: Medicare HMO

## 2019-03-02 ENCOUNTER — Ambulatory Visit (INDEPENDENT_AMBULATORY_CARE_PROVIDER_SITE_OTHER): Payer: Medicare HMO | Admitting: Internal Medicine

## 2019-03-02 ENCOUNTER — Other Ambulatory Visit: Payer: Self-pay

## 2019-03-02 ENCOUNTER — Encounter: Payer: Self-pay | Admitting: Internal Medicine

## 2019-03-02 DIAGNOSIS — E119 Type 2 diabetes mellitus without complications: Secondary | ICD-10-CM | POA: Diagnosis not present

## 2019-03-02 DIAGNOSIS — M25552 Pain in left hip: Secondary | ICD-10-CM | POA: Diagnosis not present

## 2019-03-02 DIAGNOSIS — I10 Essential (primary) hypertension: Secondary | ICD-10-CM | POA: Diagnosis not present

## 2019-03-02 DIAGNOSIS — M160 Bilateral primary osteoarthritis of hip: Secondary | ICD-10-CM | POA: Diagnosis not present

## 2019-03-02 MED ORDER — DICLOFENAC SODIUM 1 % TD GEL: 2.0000 g | Freq: Four times a day (QID) | TRANSDERMAL | 3 refills | Status: DC | PRN

## 2019-03-02 NOTE — Assessment & Plan Note (Signed)
For films today, possible OA, but more likely mild fasciitis, for volt gel pn, and consider PT and or Sport med referral

## 2019-03-02 NOTE — Patient Instructions (Addendum)
Please take all new medication as prescribed - the topical pain medication as needed  Please continue all other medications as before, and refills have been done if requested.  Please have the pharmacy call with any other refills you may need.  Please keep your appointments with your specialists as you may have planned  Please go to the XRAY Department in the Basement (go straight as you get off the elevator) for the x-ray testing  You will be contacted by phone if any changes need to be made immediately.  Otherwise, you will receive a letter about your results with an explanation, but please check with MyChart first.  Please remember to sign up for MyChart if you have not done so, as this will be important to you in the future with finding out test results, communicating by private email, and scheduling acute appointments online when needed.

## 2019-03-02 NOTE — Progress Notes (Signed)
Subjective:    Patient ID: Andres [name], male    DOB: 03-22-1941, 78 y.o.   MRN: [id number]  HPI  Here to f/u with c/o pain and dscomfort to left upper lateral leg from greater trochanter area to the knee x 2 days, started after playing golf and quite annoying that he couldn't keep up with the guys so well on the course.  Tylenol helped somewhat.  Worse to walk, nothing else makes better or worse.  No falls. Pt denies chest pain, increased sob or doe, wheezing, orthopnea, PND, increased LE swelling, palpitations, dizziness or syncope.  Pt denies new neurological symptoms such as new headache, or facial or extremity weakness or numbness   Pt denies polydipsia, polyuria Past Medical History:  Diagnosis Date  . ALLERGIC RHINITIS   . Benign prostatic hypertrophy   . 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 Last nuc 2012 negative. 07/21/2013  . Carpal tunnel syndrome, bilateral   . Cataracts, bilateral   . Coronary artery disease    Dr. Claiborne Billings; 2D ECHO, 12/11/2011 - EF 50-55%, normal; NUCLEAR STRESS TEST, 10/16/2010 - no evidemce of inducible ischemia  . Diabetes mellitus type II   . Diverticulosis of colon   . GERD (gastroesophageal reflux disease)   . H/O hiatal hernia   . Head mass   . Hepatitis   . Hx of   sessile serrated colonic polyp 02/27/2015  . Hyperlipidemia   . Hypertension   . Inguinal hernia, Rt reduced, Lt present 07/21/2013  . Low back pain   . Mitral valve prolapse 11/24/2013   By echo dec 2014  . Nephrolithiasis   . 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 Occlusion and stenosis of carotid artery without mention of cerebral infarction    CAROTID DOPPLER, 03/18/2012 - srable, mild, hard, plaque noted, bilaterally  . Stroke [facility])    Past Surgical History:  Procedure Laterality Date  . APPENDECTOMY    . CARDIAC CATHETERIZATION    . CATARACT EXTRACTION Right   . CORONARY ARTERY BYPASS GRAFT    . INGUINAL HERNIA REPAIR Right 07/26/2013   Procedure: REPAIR  INCARCERATED RIGHT  INGUINAL  HERNIA ;  Surgeon: Gwenyth Ober, MD;  Location: [facility];  Service: General;  Laterality: Right;    reports that he quit smoking about 29 years ago. His smoking use included cigarettes. He has never used smokeless tobacco. He reports that he does not drink alcohol or use drugs. family history includes Alzheimer's disease in his father; Diabetes in his brother, mother, and sister; Heart attack in his brother, maternal grandmother, and mother; Hypertension in his maternal grandmother and son; Kidney disease in his brother and son; Pancreatic cancer in his brother. No Known Allergies Current Outpatient Medications on File Prior to Visit  Medication Sig Dispense Refill  . Acetaminophen 500 MG coapsule Take 500 mg by mouth every 6 (six) hours as needed for mild pain. Reported on 07/21/2015    . Ascorbic Acid (VITAMIN C) 1000 MG tablet Take 1,000 mg by mouth daily.    . Aspirin (ADULT ASPIRIN LOW STRENGTH) 81 MG EC tablet Take 81 mg by mouth daily.      Marland Kitchen atorvastatin (LIPITOR) 40 MG tablet Take 1 tablet (40 mg total) by mouth daily. 90 tablet 3  . clopidogrel (PLAVIX) 75 MG tablet Take 1 tablet (75 mg total) by mouth daily. 90 tablet 3  . folic acid (FOLVITE) 1 MG tablet Take 1 tablet (1 mg total) by mouth  daily. 90 tablet 3  . hydrochlorothiazide (MICROZIDE) 12.5 MG capsule Take 1 capsule (12.5 mg total) by mouth daily. 90 capsule 3  . levETIRAcetam (KEPPRA) 500 MG tablet Take 1 tablet (500 mg total) by mouth 2 (two) times daily. 180 tablet 3  . losartan (COZAAR) 50 MG tablet Take 1 tablet (50 mg total) by mouth daily. 90 tablet 3  . metoprolol tartrate (LOPRESSOR) 50 MG tablet Take 1 tablet (50 mg total) by mouth 2 (two) times daily. 180 tablet 3  . Multiple Vitamin (MULTIVITAMIN) capsule Take 1 capsule by mouth daily.     . Omega-3 Fatty Acids (OMEGA 3 PO) Take 360 mg by mouth 2 (two) times daily.    . tamsulosin (FLOMAX) 0.4 MG CAPS capsule Take 1 capsule (0.4 mg total) by mouth daily. 90  capsule 3   No current facility-administered medications on file prior to visit.    Review of Systems  Constitutional: Negative for other unusual diaphoresis or sweats HENT: Negative for ear discharge or swelling Eyes: Negative for other worsening visual disturbances Respiratory: Negative for stridor or other swelling  Gastrointestinal: Negative for worsening distension or other blood Genitourinary: Negative for retention or other urinary change Musculoskeletal: Negative for other MSK pain or swelling Skin: Negative for color change or other new lesions Neurological: Negative for worsening tremors and other numbness  Psychiatric/Behavioral: Negative for worsening agitation or other fatigue All other system neg per pt    Objective:   Physical Exam BP 116/76   Pulse 70   Temp 97.6 F (36.4 C) (Oral)   Ht 5' 5.5" (1.664 m)   Wt 235 lb (106.6 kg)   SpO2 94%   BMI 38.51 kg/m  VS noted,  Constitutional: Pt appears in NAD HENT: Head: NCAT.  Right Ear: External ear normal.  Left Ear: External ear normal.  Eyes: . Pupils are equal, round, and reactive to light. Conjunctivae and EOM are normal Nose: without d/c or deformity Neck: Neck supple. Gross normal ROM Cardiovascular: Normal rate and regular rhythm.   Pulmonary/Chest: Effort normal and breath sounds without rales or wheezing.  Abd:  Soft, NT, ND, + BS, no organomegaly Nontender left upper lateral leg without rash or swelling, has minor pain with left hip flexion Neurological: Pt is alert. At baseline orientation, motor grossly intact Skin: Skin is warm. No rashes, other new lesions, no LE edema Psychiatric: Pt behavior is normal without agitation  No other exam findings    Assessment & Plan:

## 2019-03-02 NOTE — Assessment & Plan Note (Signed)
stable overall by history and exam, recent data reviewed with pt, and pt to continue medical treatment as before,  to f/u any worsening symptoms or concerns  

## 2019-03-03 ENCOUNTER — Encounter: Payer: Self-pay | Admitting: Internal Medicine

## 2019-03-10 ENCOUNTER — Encounter: Payer: Medicare HMO | Admitting: Physician Assistant

## 2019-03-10 NOTE — Progress Notes (Signed)
This encounter was created in error - please disregard.

## 2019-03-16 ENCOUNTER — Ambulatory Visit: Payer: Medicare HMO | Admitting: Podiatry

## 2019-03-16 ENCOUNTER — Other Ambulatory Visit: Payer: Self-pay

## 2019-03-16 ENCOUNTER — Encounter: Payer: Self-pay | Admitting: Podiatry

## 2019-03-16 VITALS — Temp 98.0°F

## 2019-03-16 DIAGNOSIS — D689 Coagulation defect, unspecified: Secondary | ICD-10-CM

## 2019-03-16 DIAGNOSIS — E1142 Type 2 diabetes mellitus with diabetic polyneuropathy: Secondary | ICD-10-CM | POA: Diagnosis not present

## 2019-03-16 DIAGNOSIS — M79676 Pain in unspecified toe(s): Secondary | ICD-10-CM | POA: Diagnosis not present

## 2019-03-16 DIAGNOSIS — B351 Tinea unguium: Secondary | ICD-10-CM

## 2019-03-16 NOTE — Progress Notes (Signed)
Patient ID: Andres [name], male   DOB: [birth year], 78 y.o.   MRN: [id number] . Complaint:  Visit Type: Patient returns to my office for continued preventative foot care services. Complaint: Patient states" my nails have grown long and thick and become painful to walk and wear shoes" Patient has been diagnosed with DM with no foot complications. The patient presents for preventative foot care services. No changes to ROS.  Patient is taking plavix.  Podiatric Exam: Vascular: dorsalis pedis and posterior tibial pulses are palpable bilateral. Capillary return is immediate. Temperature gradient is WNL. Skin turgor WNL  Sensorium: Diminished  Semmes Weinstein monofilament test. Normal tactile sensation bilaterally. Nail Exam: Pt has thick disfigured discolored nails with subungual debris noted bilateral entire nail hallux through fifth toenails Ulcer Exam: There is no evidence of ulcer or pre-ulcerative changes or infection. Orthopedic Exam: Muscle tone and strength are WNL. No limitations in general ROM. No crepitus or effusions noted. Foot type and digits show no abnormalities.  HAV  B/L. Skin: No Porokeratosis. No infection or ulcers  Diagnosis:  Onychomycosis, , Pain in right toe, pain in left toes  Treatment & Plan Procedures and Treatment: Consent by patient was obtained for treatment procedures. The patient understood the discussion of treatment and procedures well. All questions were answered thoroughly reviewed. Debridement of mycotic and hypertrophic toenails, 1 through 5 bilateral and clearing of subungual debris. No ulceration, no infection noted.  Return Visit-Office Procedure: Patient instructed to return to the office for a follow up visit 10 weeks for continued evaluation and treatment   Gardiner Barefoot DPM

## 2019-03-24 ENCOUNTER — Other Ambulatory Visit: Payer: Self-pay

## 2019-03-24 ENCOUNTER — Encounter: Payer: Self-pay | Admitting: Physician Assistant

## 2019-03-24 ENCOUNTER — Ambulatory Visit: Payer: Medicare HMO | Admitting: General Practice

## 2019-03-24 VITALS — BP 120/62 | HR 53 | Ht 65.0 in | Wt 234.0 lb

## 2019-03-24 DIAGNOSIS — Z951 Presence of aortocoronary bypass graft: Secondary | ICD-10-CM | POA: Diagnosis not present

## 2019-03-24 DIAGNOSIS — E785 Hyperlipidemia, unspecified: Secondary | ICD-10-CM | POA: Diagnosis not present

## 2019-03-24 DIAGNOSIS — I10 Essential (primary) hypertension: Secondary | ICD-10-CM | POA: Diagnosis not present

## 2019-03-24 NOTE — Patient Instructions (Signed)
Medication Instructions:  Your physician recommends that you continue on your current medications as directed. Please refer to the Current Medication list given to you today.  If you need a refill on your cardiac medications before your next appointment, please call your pharmacy.   Lab work: NONE ordered at this time of appointment   If you have labs (blood work) drawn today and your tests are completely normal, you will receive your results only by: Marland Kitchen MyChart Message (if you have MyChart) OR . A paper copy in the mail If you have any lab test that is abnormal or we need to change your treatment, we will call you to review the results.  Testing/Procedures: NONE ordered at this time of appointment   Follow-Up: At [facility], you and your health needs are our priority.  As part of our continuing mission to provide you with exceptional heart care, we have created designated Provider Care Teams.  These Care Teams include your primary Cardiologist (physician) and Advanced Practice Providers (APPs -  Physician Assistants and Nurse Practitioners) who all work together to provide you with the care you need, when you need it. You will need a follow up appointment in 12 months (August 2021).  Please call our office in June 2021 to schedule this appointment.  You may see Shelva Majestic, MD or one of the following Advanced Practice Providers on your designated Care Team: [place], [place] . Fabian Sharp, PA-C  Any Other Special Instructions Will Be Listed Below (If Applicable).

## 2019-03-24 NOTE — Progress Notes (Signed)
Cardiology Clinic Note   Patient Name: Andres [name] Date of Encounter: 03/24/2019  Primary Care Provider:  [name], MD Primary Cardiologist:  Shelva Majestic, MD  Patient Profile    Andres [name] 78 year old male presents today for a one-year cardiology follow-up.  Past Medical History    Past Medical History:  Diagnosis Date  . ALLERGIC RHINITIS   . Benign prostatic hypertrophy   . 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 Last nuc 2012 negative. 07/21/2013  . Carpal tunnel syndrome, bilateral   . Cataracts, bilateral   . Coronary artery disease    Dr. Claiborne Billings; 2D ECHO, 12/11/2011 - EF 50-55%, normal; NUCLEAR STRESS TEST, 10/16/2010 - no evidemce of inducible ischemia  . Diabetes mellitus type II   . Diverticulosis of colon   . GERD (gastroesophageal reflux disease)   . H/O hiatal hernia   . Head mass   . Hepatitis   . Hx of   sessile serrated colonic polyp 02/27/2015  . Hyperlipidemia   . Hypertension   . Inguinal hernia, Rt reduced, Lt present 07/21/2013  . Low back pain   . Mitral valve prolapse 11/24/2013   By echo dec 2014  . Nephrolithiasis   . Obesity, morbid ([employer])   . Occlusion and stenosis of carotid artery without mention of cerebral infarction    CAROTID DOPPLER, 03/18/2012 - srable, mild, hard, plaque noted, bilaterally  . Stroke [facility])    Past Surgical History:  Procedure Laterality Date  . APPENDECTOMY    . CARDIAC CATHETERIZATION    . CATARACT EXTRACTION Right   . CORONARY ARTERY BYPASS GRAFT    . INGUINAL HERNIA REPAIR Right 07/26/2013   Procedure: REPAIR  INCARCERATED RIGHT INGUINAL  HERNIA ;  Surgeon: Gwenyth Ober, MD;  Location: [place];  Service: General;  Laterality: Right;    Allergies  No Known Allergies  History of Present Illness    [name] was last seen by Dr. Claiborne Billings on 02/22/2018 during that time he was doing well.  He had been swimming 4 days a week, had no complaints of chest pain, and had lost around 5 pounds.   He has an established history of coronary artery disease as well as cerebrovascular disease.  In 1997 he underwent 6 vessel CABG by Dr. Servando Snare.  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  A nuclear stress test in March 2012 showed previously noted inferior scar and an echo in March 2013 showed mid asymmetric left ventricular hypertrophy with an EF of 50 to 55%.  He was hospitalized in December 2014 for renal insufficiency, hypertension and also had positive troponins.  During that time he had a negative pharmacologic stress test.  A small bowel obstruction was found with right inguinal hernia with herniation of the distal small bowel.  In December 2014 he underwent surgery for his incarcerated right inguinal hernia by Dr. Hulen Skains and tolerated the surgery well from a cardiovascular standpoint.  He had a nuclear stress test in November 2018.  Diaphragmatic attenuation was noted.  A defect was noted in the basilar inferior, inferior lateral, and mid inferior lateral locations his LVEF was reported at 33%.  He had a subsequent echocardiogram on 07/06/2017 that showed LVEF of 55 to 60% with wall motion abnormality, and mild LVH.  He is seen in clinic today and states that he feels well, continues to swim 4 times a week, and play golf 2 times per week.  He does state that he had a syncopal event in February at church, was taken to [facility] where he was evaluated and referred to neurologist.  He was started on Keppra and has had no recurrent syncopal events.   He denies chest pain, shortness of breath, orthopnea, dyspnea with exertion, melena, hematuria, hemoptysis, presyncope, syncope, palpitations, orthopnea and PND.   Home Medications    Prior to Admission medications   Medication Sig Start Date End Date Taking? Authorizing Provider  Acetaminophen 500 MG coapsule Take 500 mg by mouth every 6 (six) hours as needed for mild pain. Reported on 07/21/2015    [provider]  Ascorbic Acid (VITAMIN C) 1000 MG tablet Take 1,000 mg by mouth daily.    [provider]  Aspirin (ADULT ASPIRIN LOW STRENGTH) 81 MG EC tablet Take 81 mg by mouth daily.      [provider]  atorvastatin (LIPITOR) 40 MG tablet Take 1 tablet (40 mg total) by mouth daily. 12/02/17   [name], MD  clopidogrel (PLAVIX) 75 MG tablet Take 1 tablet (75 mg total) by mouth daily. 12/02/17   [name], MD  diclofenac sodium (VOLTAREN) 1 % GEL Apply 2 g topically every 6 (six) hours as needed (for arthritis). 03/02/19   [name], MD  folic acid (FOLVITE) 1 MG tablet Take 1 tablet (1 mg total) by mouth daily. 12/02/17   [name], MD  hydrochlorothiazide (MICROZIDE) 12.5 MG capsule Take 1 capsule (12.5 mg total) by mouth daily. 10/04/18 10/04/19  [name], MD  levETIRAcetam (KEPPRA) 500 MG tablet Take 1 tablet (500 mg total) by mouth 2 (two) times daily. 09/16/18   Star Age, MD  losartan (COZAAR) 50 MG tablet Take 1 tablet (50 mg total) by mouth daily. 10/04/18   [name], MD  metoprolol tartrate (LOPRESSOR) 50 MG tablet Take 1 tablet (50 mg total) by mouth 2 (two) times daily. 12/02/17   [name], MD  Multiple Vitamin (MULTIVITAMIN) capsule Take 1 capsule by mouth daily.     [provider]  Omega-3 Fatty Acids (OMEGA 3 PO) Take 360 mg by mouth 2 (two) times daily.    [provider]  tamsulosin (FLOMAX) 0.4 MG CAPS capsule Take 1 capsule (0.4 mg total) by mouth daily. 12/02/17   [name], MD    Family History    Family History  Problem Relation Age of Onset  . Heart attack Mother   . Diabetes Mother   . Heart attack Brother   . Heart attack Maternal Grandmother   . Hypertension Maternal Grandmother   . Pancreatic cancer Brother   . Kidney disease Brother   . Hypertension Son   . Kidney disease Son        ?  Marland Kitchen Alzheimer's disease Father   . Diabetes Brother   . Diabetes Sister    He indicated that his mother is  deceased. He indicated that the status of his father is unknown. He indicated that the status of his sister is unknown. He indicated that three of his four brothers are deceased. He indicated that his maternal grandmother is deceased. He indicated that his maternal grandfather is deceased. He indicated that his paternal grandmother is deceased. He indicated that his paternal grandfather is deceased. He indicated that his son is deceased.  Social History    Social History   Socioeconomic History  . Marital status: Widowed    Spouse name:  widowed  . Number of children: 3  . Years of education: Not on file  . Highest education level: Not on file  Occupational History  . Occupation: retired    Fish farm manager: [employer]  . Financial resource strain: Not on file  . Food insecurity    Worry: Not on file    Inability: Not on file  . Transportation needs    Medical: Not on file    Non-medical: Not on file  Tobacco Use  . Smoking status: Former Smoker    Types: Cigarettes    Quit date: 12/20/1989    Years since quitting: 29.2  . Smokeless tobacco: Never Used  Substance and Sexual Activity  . Alcohol use: No    Alcohol/week: 0.0 standard drinks  . Drug use: No  . Sexual activity: Not on file  Lifestyle  . Physical activity    Days per week: Not on file    Minutes per session: Not on file  . Stress: Not on file  Relationships  . Social Herbalist on phone: Not on file    Gets together: Not on file    Attends religious service: Not on file    Active member of club or organization: Not on file    Attends meetings of clubs or organizations: Not on file    Relationship status: Not on file  . Intimate partner violence    Fear of current or ex partner: Not on file    Emotionally abused: Not on file    Physically abused: Not on file    Forced sexual activity: Not on file  Other Topics Concern  . Not on file  Social History Narrative  . Not on file     Review of  Systems    General:  No chills, fever, night sweats or weight changes.  Cardiovascular:  No chest pain, dyspnea on exertion, edema, orthopnea, palpitations, paroxysmal nocturnal dyspnea. Dermatological: No rash, lesions/masses Respiratory: No cough, dyspnea Urologic: No hematuria, dysuria Abdominal:   No nausea, vomiting, diarrhea, bright red blood per rectum, melena, or hematemesis Neurologic:  No visual changes, wkns, changes in mental status. All other systems reviewed and are otherwise negative except as noted above.  Physical Exam    VS:  BP 120/62   Pulse (!) 53   Ht 5\' 5"  (1.651 m)   Wt 234 lb (106.1 kg)   BMI 38.94 kg/m  , BMI Body mass index is 38.94 kg/m. GEN: Well nourished, well developed, in no acute distress. HEENT: normal. Neck: Supple, no JVD, carotid bruits, or masses. Cardiac: RRR, no murmurs, rubs, or gallops. No clubbing, cyanosis, edema.  Radials/DP/PT 2+ and equal bilaterally.  Respiratory:  Respirations regular and unlabored, clear to auscultation bilaterally. GI: Soft, nontender, nondistended, BS + x 4. MS: no deformity or atrophy. Skin: warm and dry, no rash. Neuro:  Strength and sensation are intact. Psych: Normal affect.  Accessory Clinical Findings    ECG personally reviewed by me today-sinus bradycardia with sinus arrhythmia 53 bpm- No acute changes  EKG 09/11/2018 Sinus rhythm 64 bpm  Echocardiogram 09/06/2018 IMPRESSIONS    1. The left ventricle has normal systolic function of 62-95%. The cavity size is normal. There is normal left ventricular wall thickness. Echo evidence of impaired relaxation diastolic filling patterns. Normal left ventricular filling pressures.  2. Normal left atrial size.  3. Normal right atrial size.  4. Normal tricuspid valve.  5. No atrial level shunt detected by color flow  Doppler.   Assessment & Plan   1.  Status post coronary artery bypass- 1997 he underwent 6 vessel CABG by Dr. Servando Snare.  No chest pain today  continues to be physically active 6 days/week Continue aspirin 81 mg tablet daily Continue atorvastatin 40 mg tablet daily Continue clopidogrel 75 mg tablet daily Continue hydrochlorothiazide 12.5 mg tablet daily Continue losartan potassium 50 mg tablet daily Continue metoprolol tartrate 50 mg tablet twice daily Low-sodium heart healthy diet Maintain physical activity  2.  Essential hypertension- well-controlled today 120/62 Continue aspirin 81 mg tablet daily Continue atorvastatin 40 mg tablet daily Continue clopidogrel 75 mg tablet daily Continue hydrochlorothiazide 12.5 mg tablet daily Continue losartan potassium 50 mg tablet daily Continue metoprolol tartrate 50 mg tablet twice daily Low-sodium heart healthy diet Maintain physical activity  3.  Hyperlipidemia-12/08/2018: Cholesterol 126; HDL 33.20; LDL Cholesterol 62; Triglycerides 154.0; VLDL 30.8 target LDL goal less than 100. Continue atorvastatin 40 mg tablet daily Low-sodium heart healthy diet Maintain physical activity  4.  Morbid obesity- BMI 38.9, has lost another pound in the last year Low-sodium heart healthy diet Maintain physical activity Continue weight loss   Disposition: Follow-up with Dr. Claiborne Billings in 1 year  Deberah Pelton, NP 03/24/2019, 4:57 PM

## 2019-05-25 ENCOUNTER — Ambulatory Visit: Payer: Medicare HMO | Admitting: Podiatry

## 2019-05-25 ENCOUNTER — Other Ambulatory Visit: Payer: Self-pay

## 2019-05-25 ENCOUNTER — Encounter: Payer: Self-pay | Admitting: Podiatry

## 2019-05-25 DIAGNOSIS — E1142 Type 2 diabetes mellitus with diabetic polyneuropathy: Secondary | ICD-10-CM | POA: Diagnosis not present

## 2019-05-25 DIAGNOSIS — D689 Coagulation defect, unspecified: Secondary | ICD-10-CM | POA: Diagnosis not present

## 2019-05-25 DIAGNOSIS — M79676 Pain in unspecified toe(s): Secondary | ICD-10-CM

## 2019-05-25 DIAGNOSIS — B351 Tinea unguium: Secondary | ICD-10-CM

## 2019-05-25 NOTE — Progress Notes (Signed)
Patient ID: Andres [name], male   DOB: 04/18/1941, 78 y.o.   MRN: [id number] . Complaint:  Visit Type: Patient returns to my office for continued preventative foot care services. Complaint: Patient states" my nails have grown long and thick and become painful to walk and wear shoes" Patient has been diagnosed with DM with no foot complications. The patient presents for preventative foot care services. No changes to ROS.  Patient is taking plavix.  Podiatric Exam: Vascular: dorsalis pedis and posterior tibial pulses are palpable bilateral. Capillary return is immediate. Temperature gradient is WNL. Skin turgor WNL  Sensorium: Diminished  Semmes Weinstein monofilament test. Normal tactile sensation bilaterally. Nail Exam: Pt has thick disfigured discolored nails with subungual debris noted bilateral entire nail hallux through fifth toenails Ulcer Exam: There is no evidence of ulcer or pre-ulcerative changes or infection. Orthopedic Exam: Muscle tone and strength are WNL. No limitations in general ROM. No crepitus or effusions noted. Foot type and digits show no abnormalities.  HAV  B/L. Skin: No Porokeratosis. No infection or ulcers  Diagnosis:  Onychomycosis, , Pain in right toe, pain in left toes  Treatment & Plan Procedures and Treatment: Consent by patient was obtained for treatment procedures. The patient understood the discussion of treatment and procedures well. All questions were answered thoroughly reviewed. Debridement of mycotic and hypertrophic toenails, 1 through 5 bilateral and clearing of subungual debris. No ulceration, no infection noted.  Return Visit-Office Procedure: Patient instructed to return to the office for a follow up visit 10 weeks for continued evaluation and treatment   Fumiye Lubben DPM 

## 2019-05-30 DIAGNOSIS — R69 Illness, unspecified: Secondary | ICD-10-CM | POA: Diagnosis not present

## 2019-06-08 ENCOUNTER — Other Ambulatory Visit: Payer: Self-pay

## 2019-06-08 ENCOUNTER — Ambulatory Visit (INDEPENDENT_AMBULATORY_CARE_PROVIDER_SITE_OTHER): Payer: Medicare HMO | Admitting: Internal Medicine

## 2019-06-08 ENCOUNTER — Encounter: Payer: Self-pay | Admitting: Internal Medicine

## 2019-06-08 VITALS — BP 118/78 | HR 72 | Temp 97.8°F | Ht 65.0 in | Wt 233.0 lb

## 2019-06-08 DIAGNOSIS — I10 Essential (primary) hypertension: Secondary | ICD-10-CM

## 2019-06-08 DIAGNOSIS — E611 Iron deficiency: Secondary | ICD-10-CM

## 2019-06-08 DIAGNOSIS — E785 Hyperlipidemia, unspecified: Secondary | ICD-10-CM

## 2019-06-08 DIAGNOSIS — E538 Deficiency of other specified B group vitamins: Secondary | ICD-10-CM | POA: Diagnosis not present

## 2019-06-08 DIAGNOSIS — E559 Vitamin D deficiency, unspecified: Secondary | ICD-10-CM

## 2019-06-08 DIAGNOSIS — Z0000 Encounter for general adult medical examination without abnormal findings: Secondary | ICD-10-CM

## 2019-06-08 DIAGNOSIS — E119 Type 2 diabetes mellitus without complications: Secondary | ICD-10-CM

## 2019-06-08 LAB — POCT GLYCOSYLATED HEMOGLOBIN (HGB A1C): Hemoglobin A1C: 5.6 % (ref 4.0–5.6)

## 2019-06-08 NOTE — Assessment & Plan Note (Signed)
stable overall by history and exam, recent data reviewed with pt, and pt to continue medical treatment as before,  to f/u any worsening symptoms or concerns  

## 2019-06-08 NOTE — Progress Notes (Signed)
Subjective:    Patient ID: Andres [name], male    DOB: 08/25/1940, 78 y.o.   MRN: [id number]  HPI  Here to f/u; overall doing ok,  Pt denies chest pain, increasing sob or doe, wheezing, orthopnea, PND, increased LE swelling, palpitations, dizziness or syncope.  Pt denies new neurological symptoms such as new headache, or facial or extremity weakness or numbness.  Pt denies polydipsia, polyuria, or low sugar episode.  Pt states overall good compliance with meds, mostly trying to follow appropriate diet, with wt overall stable,  but little exercise however.  Pt denies fever, wt loss, night sweats, loss of appetite, or other constitutional symptoms   Past Medical History:  Diagnosis Date  . ALLERGIC RHINITIS   . Benign prostatic hypertrophy   . 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 Last nuc 2012 negative. 07/21/2013  . Carpal tunnel syndrome, bilateral   . Cataracts, bilateral   . Coronary artery disease    Dr. Claiborne Billings; 2D ECHO, 12/11/2011 - EF 50-55%, normal; NUCLEAR STRESS TEST, 10/16/2010 - no evidemce of inducible ischemia  . Diabetes mellitus type II   . Diverticulosis of colon   . GERD (gastroesophageal reflux disease)   . H/O hiatal hernia   . Head mass   . Hepatitis   . Hx of   sessile serrated colonic polyp 02/27/2015  . Hyperlipidemia   . Hypertension   . Inguinal hernia, Rt reduced, Lt present 07/21/2013  . Low back pain   . Mitral valve prolapse 11/24/2013   By echo dec 2014  . Nephrolithiasis   . Obesity, morbid ([place])   . Occlusion and stenosis of carotid artery without mention of cerebral infarction    CAROTID DOPPLER, 03/18/2012 - srable, mild, hard, plaque noted, bilaterally  . Stroke [facility])    Past Surgical History:  Procedure Laterality Date  . APPENDECTOMY    . CARDIAC CATHETERIZATION    . CATARACT EXTRACTION Right   . CORONARY ARTERY BYPASS GRAFT    . INGUINAL HERNIA REPAIR Right 07/26/2013   Procedure: REPAIR  INCARCERATED RIGHT INGUINAL  HERNIA ;   Surgeon: Gwenyth Ober, MD;  Location: [place];  Service: General;  Laterality: Right;    reports that he quit smoking about 29 years ago. His smoking use included cigarettes. He has never used smokeless tobacco. He reports that he does not drink alcohol or use drugs. family history includes Alzheimer's disease in his father; Diabetes in his brother, mother, and sister; Heart attack in his brother, maternal grandmother, and mother; Hypertension in his maternal grandmother and son; Kidney disease in his brother and son; Pancreatic cancer in his brother. No Known Allergies Current Outpatient Medications on File Prior to Visit  Medication Sig Dispense Refill  . Acetaminophen 500 MG coapsule Take 500 mg by mouth every 6 (six) hours as needed for mild pain. Reported on 07/21/2015    . Ascorbic Acid (VITAMIN C) 1000 MG tablet Take 1,000 mg by mouth daily.    . Aspirin (ADULT ASPIRIN LOW STRENGTH) 81 MG EC tablet Take 81 mg by mouth daily.      Marland Kitchen atorvastatin (LIPITOR) 40 MG tablet Take 1 tablet (40 mg total) by mouth daily. 90 tablet 3  . clopidogrel (PLAVIX) 75 MG tablet Take 1 tablet (75 mg total) by mouth daily. 90 tablet 3  . diclofenac sodium (VOLTAREN) 1 % GEL Apply 2 g topically every 6 (six) hours as needed (for arthritis). 123XX123 g 3  . folic  acid (FOLVITE) 1 MG tablet Take 1 tablet (1 mg total) by mouth daily. 90 tablet 3  . hydrochlorothiazide (MICROZIDE) 12.5 MG capsule Take 1 capsule (12.5 mg total) by mouth daily. 90 capsule 3  . levETIRAcetam (KEPPRA) 500 MG tablet Take 1 tablet (500 mg total) by mouth 2 (two) times daily. 180 tablet 3  . losartan (COZAAR) 50 MG tablet Take 1 tablet (50 mg total) by mouth daily. 90 tablet 3  . metoprolol tartrate (LOPRESSOR) 50 MG tablet Take 1 tablet (50 mg total) by mouth 2 (two) times daily. 180 tablet 3  . Multiple Vitamin (MULTIVITAMIN) capsule Take 1 capsule by mouth daily.     . Omega-3 Fatty Acids (OMEGA 3 PO) Take 360 mg by mouth 2 (two) times daily.     . tamsulosin (FLOMAX) 0.4 MG CAPS capsule Take 1 capsule (0.4 mg total) by mouth daily. 90 capsule 3   No current facility-administered medications on file prior to visit.    Review of Systems  Constitutional: Negative for other unusual diaphoresis or sweats HENT: Negative for ear discharge or swelling Eyes: Negative for other worsening visual disturbances Respiratory: Negative for stridor or other swelling  Gastrointestinal: Negative for worsening distension or other blood Genitourinary: Negative for retention or other urinary change Musculoskeletal: Negative for other MSK pain or swelling Skin: Negative for color change or other new lesions Neurological: Negative for worsening tremors and other numbness  Psychiatric/Behavioral: Negative for worsening agitation or other fatigue All otherwise neg per pt     Objective:   Physical Exam BP 118/78   Pulse 72   Temp 97.8 F (36.6 C) (Oral)   Ht 5\' 5"  (1.651 m)   Wt 233 lb (105.7 kg)   SpO2 96%   BMI 38.77 kg/m  VS noted,  Constitutional: Pt appears in NAD HENT: Head: NCAT.  Right Ear: External ear normal.  Left Ear: External ear normal.  Eyes: . Pupils are equal, round, and reactive to light. Conjunctivae and EOM are normal Nose: without d/c or deformity Neck: Neck supple. Gross normal ROM Cardiovascular: Normal rate and regular rhythm.   Pulmonary/Chest: Effort normal and breath sounds without rales or wheezing.  Abd:  Soft, NT, ND, + BS, no organomegaly Neurological: Pt is alert. At baseline orientation, motor grossly intact Skin: Skin is warm. No rashes, other new lesions, no LE edema Psychiatric: Pt behavior is normal without agitation  All otherwise neg per pt  Lab Results  Component Value Date   WBC 4.0 12/08/2018   HGB 12.9 (L) 12/08/2018   HCT 37.7 (L) 12/08/2018   PLT 152.0 12/08/2018   GLUCOSE 104 (H) 12/08/2018   CHOL 126 12/08/2018   TRIG 154.0 (H) 12/08/2018   HDL 33.20 (L) 12/08/2018   LDLCALC 62  12/08/2018   ALT 17 12/08/2018   AST 25 12/08/2018   NA 143 12/08/2018   K 4.0 12/08/2018   CL 106 12/08/2018   CREATININE 1.19 12/08/2018   BUN 16 12/08/2018   CO2 31 12/08/2018   TSH 0.82 12/08/2018   PSA 0.63 12/02/2017   INR 0.97 09/05/2018   HGBA1C 6.6 (H) 12/08/2018   MICROALBUR 2.4 (H) 12/08/2018   POCT glycosylated hemoglobin (Hb A1C) Order: [id number] Status:  Final result Visible to patient:  No (not released) Dx:  Type 2 diabetes mellitus without comp...  Ref Range & Units 10:33 71mo ago 82mo ago 45mo ago 101yr ago 25yr ago 41yr ago  Hemoglobin A1C 4.0 - 5.6 % 5.6  6.6High  R, CM  6.4 R, CM  5.6  6.4 R, CM  6.2 R, CM  6.3 R, CM           Assessment & Plan:

## 2019-06-08 NOTE — Patient Instructions (Signed)
Your A1c is OK today  Please continue all other medications as before, and refills have been done if requested.  Please have the pharmacy call with any other refills you may need.  Please continue your efforts at being more active, low cholesterol diet, and weight control.  Please keep your appointments with your specialists as you may have planned  Please return in 6 months, or sooner if needed, with Lab testing done 3-5 days before

## 2019-06-14 DIAGNOSIS — R569 Unspecified convulsions: Secondary | ICD-10-CM | POA: Diagnosis not present

## 2019-06-14 DIAGNOSIS — I2510 Atherosclerotic heart disease of native coronary artery without angina pectoris: Secondary | ICD-10-CM | POA: Diagnosis not present

## 2019-06-14 DIAGNOSIS — Z008 Encounter for other general examination: Secondary | ICD-10-CM | POA: Diagnosis not present

## 2019-06-14 DIAGNOSIS — E669 Obesity, unspecified: Secondary | ICD-10-CM | POA: Diagnosis not present

## 2019-06-14 DIAGNOSIS — M1990 Unspecified osteoarthritis, unspecified site: Secondary | ICD-10-CM | POA: Diagnosis not present

## 2019-06-14 DIAGNOSIS — E785 Hyperlipidemia, unspecified: Secondary | ICD-10-CM | POA: Diagnosis not present

## 2019-06-14 DIAGNOSIS — Z7902 Long term (current) use of antithrombotics/antiplatelets: Secondary | ICD-10-CM | POA: Diagnosis not present

## 2019-06-14 DIAGNOSIS — R32 Unspecified urinary incontinence: Secondary | ICD-10-CM | POA: Diagnosis not present

## 2019-06-14 DIAGNOSIS — I10 Essential (primary) hypertension: Secondary | ICD-10-CM | POA: Diagnosis not present

## 2019-06-14 DIAGNOSIS — N400 Enlarged prostate without lower urinary tract symptoms: Secondary | ICD-10-CM | POA: Diagnosis not present

## 2019-06-14 DIAGNOSIS — Z6839 Body mass index (BMI) 39.0-39.9, adult: Secondary | ICD-10-CM | POA: Diagnosis not present

## 2019-06-25 ENCOUNTER — Encounter: Payer: Self-pay | Admitting: Family Medicine

## 2019-06-25 ENCOUNTER — Ambulatory Visit (INDEPENDENT_AMBULATORY_CARE_PROVIDER_SITE_OTHER): Payer: Medicare HMO | Admitting: Family Medicine

## 2019-06-25 DIAGNOSIS — M79675 Pain in left toe(s): Secondary | ICD-10-CM | POA: Diagnosis not present

## 2019-06-25 MED ORDER — MELOXICAM 7.5 MG PO TABS: 7.5000 mg | ORAL_TABLET | Freq: Every day | ORAL | 0 refills | Status: DC

## 2019-06-25 NOTE — Progress Notes (Signed)
Virtual Visit via Telephone Note  I connected with Andres [name] on 06/25/19 at 12:00 PM EST by telephone and verified that I am speaking with the correct person using two identifiers.  Location: Patient: home alone  Provider: home    I discussed the limitations, risks, security and privacy concerns of performing an evaluation and management service by telephone and the availability of in person appointments. I also discussed with the patient that there may be a patient responsible charge related to this service. The patient expressed understanding and agreed to proceed.   History of Present Illness: Pt is home and c/o L big toe pain x 2 days  He does not think its gout but he did eat red meat last night    Observations/Objective: No vitals obtained  Pt is in nAD Unable to do video visit  Pt states toe is swollen   Assessment and Plan: 1. Great toe pain, left Suspect gout--- mobic temporarily F/u pcp next week if no better  - meloxicam (MOBIC) 7.5 MG tablet; Take 1 tablet (7.5 mg total) by mouth daily.  Dispense: 30 tablet; Refill: 0   Follow Up Instructions:    I discussed the assessment and treatment plan with the patient. The patient was provided an opportunity to ask questions and all were answered. The patient agreed with the plan and demonstrated an understanding of the instructions.   The patient was advised to call back or seek an in-person evaluation if the symptoms worsen or if the condition fails to improve as anticipated.  I provided 15 minutes of non-face-to-face time during this encounter.   Ann Held, DO

## 2019-08-03 ENCOUNTER — Ambulatory Visit: Payer: Medicare HMO | Admitting: Podiatry

## 2019-08-09 ENCOUNTER — Inpatient Hospital Stay (HOSPITAL_COMMUNITY): Payer: Medicare (Managed Care)

## 2019-08-09 ENCOUNTER — Emergency Department (HOSPITAL_COMMUNITY): Payer: Medicare (Managed Care)

## 2019-08-09 ENCOUNTER — Other Ambulatory Visit: Payer: Self-pay

## 2019-08-09 ENCOUNTER — Inpatient Hospital Stay (HOSPITAL_COMMUNITY)
Admission: EM | Admit: 2019-08-09 | Discharge: 2019-08-19 | DRG: 064 | Disposition: A | Discharge status: Rehabilitation Facility | Payer: Medicare (Managed Care) | Attending: Internal Medicine | Admitting: Internal Medicine

## 2019-08-09 ENCOUNTER — Encounter (HOSPITAL_COMMUNITY): Payer: Self-pay | Admitting: Emergency Medicine

## 2019-08-09 DIAGNOSIS — Z841 Family history of disorders of kidney and ureter: Secondary | ICD-10-CM

## 2019-08-09 DIAGNOSIS — N400 Enlarged prostate without lower urinary tract symptoms: Secondary | ICD-10-CM | POA: Diagnosis present

## 2019-08-09 DIAGNOSIS — H269 Unspecified cataract: Secondary | ICD-10-CM | POA: Diagnosis present

## 2019-08-09 DIAGNOSIS — E870 Hyperosmolality and hypernatremia: Secondary | ICD-10-CM | POA: Diagnosis not present

## 2019-08-09 DIAGNOSIS — D696 Thrombocytopenia, unspecified: Secondary | ICD-10-CM | POA: Diagnosis present

## 2019-08-09 DIAGNOSIS — G40909 Epilepsy, unspecified, not intractable, without status epilepticus: Secondary | ICD-10-CM | POA: Diagnosis present

## 2019-08-09 DIAGNOSIS — I10 Essential (primary) hypertension: Secondary | ICD-10-CM | POA: Diagnosis not present

## 2019-08-09 DIAGNOSIS — Z6841 Body Mass Index (BMI) 40.0 and over, adult: Secondary | ICD-10-CM

## 2019-08-09 DIAGNOSIS — I2583 Coronary atherosclerosis due to lipid rich plaque: Secondary | ICD-10-CM

## 2019-08-09 DIAGNOSIS — B957 Other staphylococcus as the cause of diseases classified elsewhere: Secondary | ICD-10-CM | POA: Diagnosis present

## 2019-08-09 DIAGNOSIS — G5603 Carpal tunnel syndrome, bilateral upper limbs: Secondary | ICD-10-CM | POA: Diagnosis present

## 2019-08-09 DIAGNOSIS — K5730 Diverticulosis of large intestine without perforation or abscess without bleeding: Secondary | ICD-10-CM | POA: Diagnosis present

## 2019-08-09 DIAGNOSIS — R471 Dysarthria and anarthria: Secondary | ICD-10-CM | POA: Diagnosis present

## 2019-08-09 DIAGNOSIS — J9601 Acute respiratory failure with hypoxia: Secondary | ICD-10-CM | POA: Diagnosis not present

## 2019-08-09 DIAGNOSIS — J309 Allergic rhinitis, unspecified: Secondary | ICD-10-CM | POA: Diagnosis present

## 2019-08-09 DIAGNOSIS — R509 Fever, unspecified: Secondary | ICD-10-CM

## 2019-08-09 DIAGNOSIS — G9341 Metabolic encephalopathy: Secondary | ICD-10-CM | POA: Diagnosis present

## 2019-08-09 DIAGNOSIS — R7881 Bacteremia: Secondary | ICD-10-CM | POA: Diagnosis present

## 2019-08-09 DIAGNOSIS — I341 Nonrheumatic mitral (valve) prolapse: Secondary | ICD-10-CM | POA: Diagnosis present

## 2019-08-09 DIAGNOSIS — K4090 Unilateral inguinal hernia, without obstruction or gangrene, not specified as recurrent: Secondary | ICD-10-CM | POA: Diagnosis present

## 2019-08-09 DIAGNOSIS — N183 Chronic kidney disease, stage 3 unspecified: Secondary | ICD-10-CM | POA: Diagnosis not present

## 2019-08-09 DIAGNOSIS — R531 Weakness: Secondary | ICD-10-CM | POA: Diagnosis not present

## 2019-08-09 DIAGNOSIS — G8104 Flaccid hemiplegia affecting left nondominant side: Secondary | ICD-10-CM | POA: Diagnosis present

## 2019-08-09 DIAGNOSIS — R4182 Altered mental status, unspecified: Secondary | ICD-10-CM

## 2019-08-09 DIAGNOSIS — R7401 Elevation of levels of liver transaminase levels: Secondary | ICD-10-CM | POA: Diagnosis present

## 2019-08-09 DIAGNOSIS — Z20822 Contact with and (suspected) exposure to covid-19: Secondary | ICD-10-CM | POA: Diagnosis present

## 2019-08-09 DIAGNOSIS — Z87442 Personal history of urinary calculi: Secondary | ICD-10-CM

## 2019-08-09 DIAGNOSIS — K219 Gastro-esophageal reflux disease without esophagitis: Secondary | ICD-10-CM | POA: Diagnosis present

## 2019-08-09 DIAGNOSIS — R29810 Facial weakness: Secondary | ICD-10-CM | POA: Diagnosis present

## 2019-08-09 DIAGNOSIS — G40009 Localization-related (focal) (partial) idiopathic epilepsy and epileptic syndromes with seizures of localized onset, not intractable, without status epilepticus: Secondary | ICD-10-CM | POA: Diagnosis not present

## 2019-08-09 DIAGNOSIS — Z7902 Long term (current) use of antithrombotics/antiplatelets: Secondary | ICD-10-CM

## 2019-08-09 DIAGNOSIS — Z820 Family history of epilepsy and other diseases of the nervous system: Secondary | ICD-10-CM

## 2019-08-09 DIAGNOSIS — Z791 Long term (current) use of non-steroidal anti-inflammatories (NSAID): Secondary | ICD-10-CM

## 2019-08-09 DIAGNOSIS — Z800 Family history of malignant neoplasm of digestive organs: Secondary | ICD-10-CM

## 2019-08-09 DIAGNOSIS — Z8673 Personal history of transient ischemic attack (TIA), and cerebral infarction without residual deficits: Secondary | ICD-10-CM

## 2019-08-09 DIAGNOSIS — Z7982 Long term (current) use of aspirin: Secondary | ICD-10-CM

## 2019-08-09 DIAGNOSIS — R569 Unspecified convulsions: Secondary | ICD-10-CM

## 2019-08-09 DIAGNOSIS — I63511 Cerebral infarction due to unspecified occlusion or stenosis of right middle cerebral artery: Secondary | ICD-10-CM | POA: Diagnosis present

## 2019-08-09 DIAGNOSIS — E1122 Type 2 diabetes mellitus with diabetic chronic kidney disease: Secondary | ICD-10-CM | POA: Diagnosis present

## 2019-08-09 DIAGNOSIS — I639 Cerebral infarction, unspecified: Secondary | ICD-10-CM | POA: Diagnosis not present

## 2019-08-09 DIAGNOSIS — Z87898 Personal history of other specified conditions: Secondary | ICD-10-CM

## 2019-08-09 DIAGNOSIS — R1310 Dysphagia, unspecified: Secondary | ICD-10-CM

## 2019-08-09 DIAGNOSIS — E875 Hyperkalemia: Secondary | ICD-10-CM | POA: Diagnosis present

## 2019-08-09 DIAGNOSIS — R1319 Other dysphagia: Secondary | ICD-10-CM | POA: Diagnosis present

## 2019-08-09 DIAGNOSIS — Z79899 Other long term (current) drug therapy: Secondary | ICD-10-CM

## 2019-08-09 DIAGNOSIS — Z87891 Personal history of nicotine dependence: Secondary | ICD-10-CM

## 2019-08-09 DIAGNOSIS — K449 Diaphragmatic hernia without obstruction or gangrene: Secondary | ICD-10-CM | POA: Diagnosis present

## 2019-08-09 DIAGNOSIS — I129 Hypertensive chronic kidney disease with stage 1 through stage 4 chronic kidney disease, or unspecified chronic kidney disease: Secondary | ICD-10-CM | POA: Diagnosis present

## 2019-08-09 DIAGNOSIS — I6300 Cerebral infarction due to thrombosis of unspecified precerebral artery: Secondary | ICD-10-CM | POA: Diagnosis not present

## 2019-08-09 DIAGNOSIS — N1831 Chronic kidney disease, stage 3a: Secondary | ICD-10-CM | POA: Diagnosis present

## 2019-08-09 DIAGNOSIS — R29715 NIHSS score 15: Secondary | ICD-10-CM | POA: Diagnosis present

## 2019-08-09 DIAGNOSIS — Z8249 Family history of ischemic heart disease and other diseases of the circulatory system: Secondary | ICD-10-CM

## 2019-08-09 DIAGNOSIS — E785 Hyperlipidemia, unspecified: Secondary | ICD-10-CM | POA: Diagnosis present

## 2019-08-09 DIAGNOSIS — G460 Middle cerebral artery syndrome: Secondary | ICD-10-CM | POA: Diagnosis present

## 2019-08-09 DIAGNOSIS — Z66 Do not resuscitate: Secondary | ICD-10-CM | POA: Diagnosis not present

## 2019-08-09 DIAGNOSIS — E1169 Type 2 diabetes mellitus with other specified complication: Secondary | ICD-10-CM | POA: Diagnosis not present

## 2019-08-09 DIAGNOSIS — Z951 Presence of aortocoronary bypass graft: Secondary | ICD-10-CM

## 2019-08-09 DIAGNOSIS — B958 Unspecified staphylococcus as the cause of diseases classified elsewhere: Secondary | ICD-10-CM | POA: Diagnosis not present

## 2019-08-09 DIAGNOSIS — N179 Acute kidney failure, unspecified: Secondary | ICD-10-CM | POA: Diagnosis not present

## 2019-08-09 DIAGNOSIS — R0989 Other specified symptoms and signs involving the circulatory and respiratory systems: Secondary | ICD-10-CM | POA: Diagnosis not present

## 2019-08-09 DIAGNOSIS — Z8719 Personal history of other diseases of the digestive system: Secondary | ICD-10-CM

## 2019-08-09 DIAGNOSIS — Z833 Family history of diabetes mellitus: Secondary | ICD-10-CM

## 2019-08-09 DIAGNOSIS — Z0189 Encounter for other specified special examinations: Secondary | ICD-10-CM

## 2019-08-09 DIAGNOSIS — E669 Obesity, unspecified: Secondary | ICD-10-CM | POA: Diagnosis not present

## 2019-08-09 DIAGNOSIS — I2510 Atherosclerotic heart disease of native coronary artery without angina pectoris: Secondary | ICD-10-CM

## 2019-08-09 DIAGNOSIS — I69354 Hemiplegia and hemiparesis following cerebral infarction affecting left non-dominant side: Secondary | ICD-10-CM | POA: Diagnosis not present

## 2019-08-09 DIAGNOSIS — R4701 Aphasia: Secondary | ICD-10-CM | POA: Diagnosis present

## 2019-08-09 DIAGNOSIS — D62 Acute posthemorrhagic anemia: Secondary | ICD-10-CM | POA: Diagnosis not present

## 2019-08-09 DIAGNOSIS — I69391 Dysphagia following cerebral infarction: Secondary | ICD-10-CM | POA: Diagnosis not present

## 2019-08-09 DIAGNOSIS — G9340 Encephalopathy, unspecified: Secondary | ICD-10-CM | POA: Diagnosis not present

## 2019-08-09 DIAGNOSIS — J690 Pneumonitis due to inhalation of food and vomit: Secondary | ICD-10-CM | POA: Diagnosis not present

## 2019-08-09 LAB — CBC WITH DIFFERENTIAL/PLATELET
Abs Immature Granulocytes: 0.04 10*3/uL (ref 0.00–0.07)
Basophils Absolute: 0 10*3/uL (ref 0.0–0.1)
Basophils Relative: 0 %
Eosinophils Absolute: 0 10*3/uL (ref 0.0–0.5)
Eosinophils Relative: 0 %
HCT: 40.2 % (ref 39.0–52.0)
Hemoglobin: 13.8 g/dL (ref 13.0–17.0)
Immature Granulocytes: 1 %
Lymphocytes Relative: 21 %
Lymphs Abs: 1.8 10*3/uL (ref 0.7–4.0)
MCH: 34.5 pg — ABNORMAL HIGH (ref 26.0–34.0)
MCHC: 34.3 g/dL (ref 30.0–36.0)
MCV: 100.5 fL — ABNORMAL HIGH (ref 80.0–100.0)
Monocytes Absolute: 0.6 10*3/uL (ref 0.1–1.0)
Monocytes Relative: 7 %
Neutro Abs: 6.3 10*3/uL (ref 1.7–7.7)
Neutrophils Relative %: 71 %
Platelets: 124 10*3/uL — ABNORMAL LOW (ref 150–400)
RBC: 4 MIL/uL — ABNORMAL LOW (ref 4.22–5.81)
RDW: 12.9 % (ref 11.5–15.5)
WBC: 8.8 10*3/uL (ref 4.0–10.5)
nRBC: 0 % (ref 0.0–0.2)

## 2019-08-09 LAB — I-STAT CHEM 8, ED
BUN: 30 mg/dL — ABNORMAL HIGH (ref 8–23)
Calcium, Ion: 1.09 mmol/L — ABNORMAL LOW (ref 1.15–1.40)
Chloride: 103 mmol/L (ref 98–111)
Creatinine, Ser: 1.3 mg/dL — ABNORMAL HIGH (ref 0.61–1.24)
Glucose, Bld: 127 mg/dL — ABNORMAL HIGH (ref 70–99)
HCT: 39 % (ref 39.0–52.0)
Hemoglobin: 13.3 g/dL (ref 13.0–17.0)
Potassium: 5.7 mmol/L — ABNORMAL HIGH (ref 3.5–5.1)
Sodium: 137 mmol/L (ref 135–145)
TCO2: 29 mmol/L (ref 22–32)

## 2019-08-09 LAB — COMPREHENSIVE METABOLIC PANEL WITH GFR
ALT: 26 U/L (ref 0–44)
AST: 45 U/L — ABNORMAL HIGH (ref 15–41)
Albumin: 4.1 g/dL (ref 3.5–5.0)
Alkaline Phosphatase: 70 U/L (ref 38–126)
Anion gap: 11 (ref 5–15)
BUN: 21 mg/dL (ref 8–23)
CO2: 27 mmol/L (ref 22–32)
Calcium: 9.8 mg/dL (ref 8.9–10.3)
Chloride: 103 mmol/L (ref 98–111)
Creatinine, Ser: 1.35 mg/dL — ABNORMAL HIGH (ref 0.61–1.24)
GFR calc Af Amer: 57 mL/min — ABNORMAL LOW
GFR calc non Af Amer: 49 mL/min — ABNORMAL LOW
Glucose, Bld: 111 mg/dL — ABNORMAL HIGH (ref 70–99)
Potassium: 4.9 mmol/L (ref 3.5–5.1)
Sodium: 141 mmol/L (ref 135–145)
Total Bilirubin: 0.4 mg/dL (ref 0.3–1.2)
Total Protein: 6.8 g/dL (ref 6.5–8.1)

## 2019-08-09 LAB — PROTIME-INR
INR: 1.1 (ref 0.8–1.2)
INR: 1.1 (ref 0.8–1.2)
INR: 1 (ref 0.8–1.2)
Prothrombin Time: 14.2 seconds (ref 11.4–15.2)
Prothrombin Time: 13.6 s (ref 11.4–15.2)
Prothrombin Time: 13.4 seconds (ref 11.4–15.2)

## 2019-08-09 LAB — APTT: aPTT: 20 seconds — ABNORMAL LOW (ref 24–36)

## 2019-08-09 LAB — CBG MONITORING, ED: Glucose-Capillary: 173 mg/dL — ABNORMAL HIGH (ref 70–99)

## 2019-08-09 LAB — ECHOCARDIOGRAM COMPLETE: Weight: 3922.42 oz

## 2019-08-09 LAB — SARS CORONAVIRUS 2 (TAT 6-24 HRS): SARS Coronavirus 2: NEGATIVE

## 2019-08-09 MED ORDER — IOHEXOL 350 MG/ML SOLN
100.0000 mL | Freq: Once | INTRAVENOUS | Status: AC | PRN
  Administered 2019-08-09: 100 mL via INTRAVENOUS

## 2019-08-09 MED ORDER — ACETAMINOPHEN 160 MG/5ML PO SOLN
650.0000 mg | ORAL | Status: DC | PRN
  Administered 2019-08-14 (×2): 650 mg
  Filled 2019-08-09 (×2): qty 20.3

## 2019-08-09 MED ORDER — DICLOFENAC SODIUM 1 % TD GEL
2.0000 g | Freq: Four times a day (QID) | TRANSDERMAL | Status: DC | PRN
  Filled 2019-08-09: qty 100

## 2019-08-09 MED ORDER — ACETAMINOPHEN 325 MG PO TABS
650.0000 mg | ORAL_TABLET | ORAL | Status: DC | PRN
  Administered 2019-08-18: 11:00:00 650 mg via ORAL
  Filled 2019-08-09: qty 2

## 2019-08-09 MED ORDER — LORAZEPAM 2 MG/ML IJ SOLN
1.0000 mg | Freq: Once | INTRAMUSCULAR | Status: AC
  Administered 2019-08-09: 1 mg via INTRAVENOUS

## 2019-08-09 MED ORDER — SODIUM CHLORIDE 0.9% FLUSH
3.0000 mL | Freq: Once | INTRAVENOUS | Status: AC
  Administered 2019-08-09: 04:00:00 3 mL via INTRAVENOUS

## 2019-08-09 MED ORDER — ASPIRIN EC 81 MG PO TBEC: 81.0000 mg | DELAYED_RELEASE_TABLET | Freq: Every day | ORAL | Status: DC

## 2019-08-09 MED ORDER — LEVETIRACETAM IN NACL 500 MG/100ML IV SOLN
500.0000 mg | Freq: Two times a day (BID) | INTRAVENOUS | Status: DC
  Administered 2019-08-09 – 2019-08-13 (×10): 500 mg via INTRAVENOUS
  Filled 2019-08-09 (×12): qty 100

## 2019-08-09 MED ORDER — LORAZEPAM 2 MG/ML IJ SOLN
INTRAMUSCULAR | Status: AC
  Filled 2019-08-09: qty 1

## 2019-08-09 MED ORDER — STROKE: EARLY STAGES OF RECOVERY BOOK
Freq: Once | Status: DC
  Filled 2019-08-09: qty 1

## 2019-08-09 MED ORDER — TAMSULOSIN HCL 0.4 MG PO CAPS
0.4000 mg | ORAL_CAPSULE | Freq: Every day | ORAL | Status: DC
  Administered 2019-08-12 – 2019-08-19 (×7): 0.4 mg via ORAL
  Filled 2019-08-09 (×7): qty 1

## 2019-08-09 MED ORDER — ASPIRIN 81 MG PO CHEW
81.0000 mg | CHEWABLE_TABLET | Freq: Every day | ORAL | Status: DC
  Administered 2019-08-12 – 2019-08-15 (×4): 81 mg via ORAL
  Filled 2019-08-09 (×4): qty 1

## 2019-08-09 MED ORDER — ACETAMINOPHEN 650 MG RE SUPP: 650.0000 mg | RECTAL | Status: DC | PRN

## 2019-08-09 MED ORDER — ATORVASTATIN CALCIUM 40 MG PO TABS
40.0000 mg | ORAL_TABLET | Freq: Every day | ORAL | Status: DC
  Administered 2019-08-12 – 2019-08-15 (×4): 40 mg via ORAL
  Filled 2019-08-09 (×4): qty 1

## 2019-08-09 MED ORDER — SODIUM CHLORIDE 0.9 % IV SOLN: INTRAVENOUS | Status: DC

## 2019-08-09 MED ORDER — CLOPIDOGREL BISULFATE 75 MG PO TABS
75.0000 mg | ORAL_TABLET | Freq: Every day | ORAL | Status: DC
  Administered 2019-08-12 – 2019-08-15 (×4): 75 mg via ORAL
  Filled 2019-08-09 (×4): qty 1

## 2019-08-09 MED ORDER — ASPIRIN 300 MG RE SUPP
300.0000 mg | Freq: Every day | RECTAL | Status: DC
  Administered 2019-08-09 – 2019-08-11 (×3): 300 mg via RECTAL
  Filled 2019-08-09 (×2): qty 1

## 2019-08-09 NOTE — Consult Note (Addendum)
Neurology Consultation Reason for Consult: Stroke Referring Physician: Stark Jock, D  CC: Left-sided weakness  History is obtained from: Son  HPI: Andres [name] is a 79 y.o. male with a history of previous stroke who was last seen well at 1 PM when his son left for work.  On returning home, his son found him on the ground confused and left-sided weakness.  Called 911 and a code stroke was activated in route.  At baseline, the patient is able to do some simple task, but needs significant help due to memory problems.    LKW: 1 PM tpa given?: no, outside of window Premorbid modified rankin scale: 3    ROS: Unable to obtain due to altered mental status.   Past Medical History:  Diagnosis Date  . ALLERGIC RHINITIS   . Benign prostatic hypertrophy   . 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 Last nuc 2012 negative. 07/21/2013  . Carpal tunnel syndrome, bilateral   . Cataracts, bilateral   . Coronary artery disease    Dr. Claiborne Billings; 2D ECHO, 12/11/2011 - EF 50-55%, normal; NUCLEAR STRESS TEST, 10/16/2010 - no evidemce of inducible ischemia  . Diabetes mellitus type II   . Diverticulosis of colon   . GERD (gastroesophageal reflux disease)   . H/O hiatal hernia   . Head mass   . Hepatitis   . Hx of   sessile serrated colonic polyp 02/27/2015  . Hyperlipidemia   . Hypertension   . Inguinal hernia, Rt reduced, Lt present 07/21/2013  . Low back pain   . Mitral valve prolapse 11/24/2013   By echo dec 2014  . Nephrolithiasis   . Obesity, morbid ([place])   . Occlusion and stenosis of carotid artery without mention of cerebral infarction    CAROTID DOPPLER, 03/18/2012 - srable, mild, hard, plaque noted, bilaterally  . Stroke [facility])      Family History  Problem Relation Age of Onset  . Heart attack Mother   . Diabetes Mother   . Heart attack Brother   . Heart attack Maternal Grandmother   . Hypertension Maternal Grandmother   . Pancreatic cancer Brother   . Kidney disease Brother    . Hypertension Son   . Kidney disease Son        ?  Marland Kitchen Alzheimer's disease Father   . Diabetes Brother   . Diabetes Sister      Social History:  reports that he quit smoking about 29 years ago. His smoking use included cigarettes. He has never used smokeless tobacco. He reports that he does not drink alcohol or use drugs.   Exam: Current vital signs: Pulse 84   Resp 17   Wt 111.2 kg   SpO2 99%   BMI 40.80 kg/m  Vital signs in last 24 hours: Pulse Rate:  [84] 84 (01/05 0224) Resp:  [17] 17 (01/05 0224) SpO2:  [99 %] 99 % (01/05 0224) Weight:  [111.2 kg] 111.2 kg (01/05 0153)   Physical Exam  Constitutional: Appears well-developed and well-nourished.  Psych: Affect appropriate to situation Eyes: No scleral injection HENT: Upper airway sounds, suspect some aspiration MSK: no joint deformities.  Cardiovascular: Normal rate and regular rhythm.  Respiratory: Effort normal, non-labored breathing GI: Soft.  No distension. There is no tenderness.  Skin: WDI  Neuro: Mental Status: Patient is awake, alert, he answers occasional questions but not a lot. Cranial Nerves: II: Does not blink to threat from the left. Pupils are equal, round, and reactive  to light.   III,IV, VI: Right gaze preference V: Facial sensation is symmetric to temperature VII: Facial movement with left weakness Motor: He has flaccid left hemiparesis, moves right side well sensory: He does respond to noxious stimulation, but less than the left Cerebellar: Does not perform      I have reviewed labs in epic and the results pertinent to this consultation are: Hyperkalemia at 5.7 Creatinine 1.3  I have reviewed the images obtained: CT/CTA/CTP-he has a short segment M1 occlusion on the right.  The perfusion scan did not generate accurate numbers, but majority of the area that demonstrates as penumbra is actually his old infarct.  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  Especially with his MRS of 3, I do not think he is a candidate for endovascular therapy at this time.  I am concerned that he may have a sizable infarct, but with his previous area of encephalomalacia doubt it will be malignant.  Recommendations: - HgbA1c, fasting lipid panel - MRI  of the brain without contrast - Frequent neuro checks - Echocardiogram - Prophylactic therapy-continue home aspirin Plavix - Risk factor modification - Telemetry monitoring - PT consult, OT consult, Speech consult - Stroke team to follow    Roland Rack, MD [facility] [phone]  If 7pm- 7am, please page neurology on call as listed in [place].

## 2019-08-09 NOTE — Progress Notes (Signed)
EEG complete - results pending 

## 2019-08-09 NOTE — ED Notes (Signed)
Updated pt's daughter on pt's condition

## 2019-08-09 NOTE — ED Notes (Signed)
Pt is NSR on monitor 

## 2019-08-09 NOTE — ED Notes (Signed)
Patient transported to X-ray 

## 2019-08-09 NOTE — ED Notes (Signed)
Please call Kamorion Shidler daughter [phone]

## 2019-08-09 NOTE — Procedures (Signed)
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 EEG to evaluate for seizure  Level of alertness: awake  AEDs during EEG study: Keppra  Technical aspects: This EEG study was done with scalp electrodes positioned according to the 10-20 International system of electrode placement. Electrical activity was acquired at a sampling rate of 500Hz  and reviewed with a high frequency filter of 70Hz  and a low frequency filter of 1Hz . EEG data were recorded continuously and digitally stored.   DESCRIPTION: No clear posterior dominant rhythm was seen. EEG showed continuous generalized and lateralized right hemisphere 3-6hz  theta-delta slowing. Hyperventilation and photic stimulation were not performed.  ABNORMALITY - Continuous slow, generalized and lateralized right hemisphere  IMPRESSION: This study is suggestive of cortical dysfunction in right hemisphere consistent with underlying infarct. Additionally, there is evidence of moderate diffuse encephalopathy. No seizures or epileptiform discharges were seen throughout the recording.  Andres [name]

## 2019-08-09 NOTE — ED Notes (Signed)
On entry into patient room, pt covered in urine. Sheet and gown wet, restraints applied. Pt taken out of restraints, cleaned, male external urinary catheter placed with [place], EMT. Leg wedged in between railing and mattress, attempt to reposition, but pt keeps returning leg to previous position. Pt total assist rolling. Pt resting comfortably, transported to xray.

## 2019-08-09 NOTE — ED Triage Notes (Signed)
Pt BIB [facility] from home, LKW 2330 08/08/19. Pt found lying on the floor by the person he lives with. Pt with incomprehensible speech, left sided weakness, and unable to follow commands. Pt incontinent of urine.

## 2019-08-09 NOTE — ED Notes (Signed)
Pt AO to him self only at this time leave door open for pt safety.

## 2019-08-09 NOTE — ED Notes (Signed)
This RN spoke with patient's daughter [name] on the phone. Daughter requesting update from the physician. Dr. Tamala Julian made aware of request by this RN.

## 2019-08-09 NOTE — ED Notes (Signed)
Dr. Leonel Ramsay on the phone with pt's son, new LKW 1300 on 08/08/19, son came home at 2330 tonight.

## 2019-08-09 NOTE — Progress Notes (Signed)
  Echocardiogram 2D Echocardiogram has been performed.  Andres [name] 08/09/2019, 11:08 AM

## 2019-08-09 NOTE — ED Notes (Signed)
Pt transferred from yellow zone to green, report just gotten from [facility], no vitals available for the past 4 hours, pt is restless on bed and confuse.

## 2019-08-09 NOTE — ED Notes (Signed)
Pt answering some questions appropriately, vomit suctioned from mouth, pupils pinpoint and equal.

## 2019-08-09 NOTE — H&P (Addendum)
History and Physical    Andres [name] [id number] DOB: 1941-02-28 DOA: 08/09/2019  Referring MD/NP/PA: Jennette Kettle, MD PCP: [name], MD  Patient coming from: Home via EMS  Chief Complaint: found down  I have personally briefly reviewed patient's old medical records in [place]   HPI: Andres [name] is a 79 y.o. male with medical history significant of hypertension, hyperlipidemia, CAD s/p CABG, carotid artery stenosis, CVA, mitral valve prolapse, diet-controlled diabetes mellitus type 2, BPH, hepatitis, and GERD.  Presents after being found down on the ground by his son with whom he lives last night at around 10:15 PM.  History is obtained from talks with the son as the patient is unable to give his own history at this time due to acuity of his symptoms.  Apparently the patient had been in his normal state of health prior to his son leaving for work at around 4 PM yesterday afternoon.  When he came home he reported that he found him on the ground drooling at the side of his mouth and he was unable to comprehend anything that the son was saying.  Patient also was noted to be weak on his left side.  He immediately called 911.  Patient had previously had a stroke, but reportedly had no significant residual deficits.  Per the son his father was able to complete his ADLs without assistance and only had some intermittent confusion or memory issues with regards to time/date.  ED Course: Patient arrived as a code stroke. In the emergency department patient was seen to be afebrile with blood pressures elevated up to 185/95, but all other vital signs relatively within normal limits.  Neurology evaluated the patient and CT/CTA imaging of the brain revealed short segment occlusion of the right MCA.  Labs significant for platelet count 124, BUN 21, creatinine 1.35, and AST 45.  Neurology recommended MRI of the brain  Review of Systems  Unable to perform ROS: Acuity of condition    Past  Medical History:  Diagnosis Date   ALLERGIC RHINITIS    Benign prostatic hypertrophy    CAD (coronary artery disease), hx of CABG 1997 X 6. Last nuc 2012 negative. 07/21/2013   Carpal tunnel syndrome, bilateral    Cataracts, bilateral    Coronary artery disease    Dr. Claiborne Billings; 2D ECHO, 12/11/2011 - EF 50-55%, normal; NUCLEAR STRESS TEST, 10/16/2010 - no evidemce of inducible ischemia   Diabetes mellitus type II    Diverticulosis of colon    GERD (gastroesophageal reflux disease)    H/O hiatal hernia    Head mass    Hepatitis    Hx of   sessile serrated colonic polyp 02/27/2015   Hyperlipidemia    Hypertension    Inguinal hernia, Rt reduced, Lt present 07/21/2013   Low back pain    Mitral valve prolapse 11/24/2013   By echo dec 2014   Nephrolithiasis    Obesity, morbid ([place])    Occlusion and stenosis of carotid artery without mention of cerebral infarction    CAROTID DOPPLER, 03/18/2012 - srable, mild, hard, plaque noted, bilaterally   Stroke [facility])     Past Surgical History:  Procedure Laterality Date   APPENDECTOMY     CARDIAC CATHETERIZATION     CATARACT EXTRACTION Right    CORONARY ARTERY BYPASS GRAFT     INGUINAL HERNIA REPAIR Right 07/26/2013   Procedure: REPAIR  INCARCERATED RIGHT INGUINAL  HERNIA ;  Surgeon: Gwenyth Ober, MD;  Location: [facility] OR;  Service: General;  Laterality: Right;     reports that he quit smoking about 29 years ago. His smoking use included cigarettes. He has never used smokeless tobacco. He reports that he does not drink alcohol or use drugs.  No Known Allergies  Family History  Problem Relation Age of Onset   Heart attack Mother    Diabetes Mother    Heart attack Brother    Heart attack Maternal Grandmother    Hypertension Maternal Grandmother    Pancreatic cancer Brother    Kidney disease Brother    Hypertension Son    Kidney disease Son        ?   Alzheimer's disease Father    Diabetes Brother     Diabetes Sister     Prior to Admission medications   Medication Sig Start Date End Date Taking? Authorizing Provider  Acetaminophen 500 MG coapsule Take 500 mg by mouth every 6 (six) hours as needed for mild pain. Reported on 07/21/2015    [provider]  Ascorbic Acid (VITAMIN C) 1000 MG tablet Take 1,000 mg by mouth daily.    [provider]  Aspirin (ADULT ASPIRIN LOW STRENGTH) 81 MG EC tablet Take 81 mg by mouth daily.      [provider]  atorvastatin (LIPITOR) 40 MG tablet Take 1 tablet (40 mg total) by mouth daily. 12/02/17   [name], MD  clopidogrel (PLAVIX) 75 MG tablet Take 1 tablet (75 mg total) by mouth daily. 12/02/17   [name], MD  diclofenac sodium (VOLTAREN) 1 % GEL Apply 2 g topically every 6 (six) hours as needed (for arthritis). 03/02/19   [name], MD  folic acid (FOLVITE) 1 MG tablet Take 1 tablet (1 mg total) by mouth daily. 12/02/17   [name], MD  hydrochlorothiazide (MICROZIDE) 12.5 MG capsule Take 1 capsule (12.5 mg total) by mouth daily. 10/04/18 10/04/19  [name], MD  levETIRAcetam (KEPPRA) 500 MG tablet Take 1 tablet (500 mg total) by mouth 2 (two) times daily. 09/16/18   Star Age, MD  losartan (COZAAR) 50 MG tablet Take 1 tablet (50 mg total) by mouth daily. 10/04/18   [name], MD  meloxicam (MOBIC) 7.5 MG tablet Take 1 tablet (7.5 mg total) by mouth daily. 06/25/19   Ann Held, DO  metoprolol tartrate (LOPRESSOR) 50 MG tablet Take 1 tablet (50 mg total) by mouth 2 (two) times daily. 12/02/17   [name], MD  Multiple Vitamin (MULTIVITAMIN) capsule Take 1 capsule by mouth daily.     [provider]  Omega-3 Fatty Acids (OMEGA 3 PO) Take 360 mg by mouth 2 (two) times daily.    [provider]  tamsulosin (FLOMAX) 0.4 MG CAPS capsule Take 1 capsule (0.4 mg total) by mouth daily. 12/02/17   [name], MD    Physical Exam:  Constitutional: Elderly male who is able to follow some  simple commands on his right side Vitals:   08/09/19 0227 08/09/19 0228 08/09/19 0229 08/09/19 0715  BP: (!) 184/91   (!) 185/95  Pulse: 80 79 78 90  Resp:   17 15  SpO2: 97% 98% 96% 100%  Weight:       Eyes: Rightward gaze preference. ENMT: Mucous membranes are moist. Posterior pharynx clear of any exudate or lesions.   Neck: normal, supple, no masses, no thyromegaly Respiratory: clear to auscultation bilaterally, no wheezing, no crackles. Normal  respiratory effort. No accessory muscle use.  Cardiovascular: Regular rate and rhythm, no murmurs / rubs / gallops. No extremity edema. 2+ pedal pulses. No carotid bruits.  Abdomen: no tenderness, no masses palpated. No hepatosplenomegaly. Bowel sounds positive.  Musculoskeletal: no clubbing / cyanosis. No joint deformity upper and lower extremities. Good ROM, no contractures. Normal muscle tone.  Skin: no rashes, lesions, ulcers. No induration Neurologic: CN 2-12 grossly intact.  Right upper and lower extremity 5 out of 5, left upper  and lower extremity 3/5.  Left-sided neglect. Psychiatric: Alert and oriented x person.  Patient thought year was 77.   Labs on Admission: I have personally reviewed following labs and imaging studies  CBC: Recent Labs  Lab 08/09/19 0254 08/09/19 0703  WBC  --  8.8  NEUTROABS  --  6.3  HGB 13.3 13.8  HCT 39.0 40.2  MCV  --  100.5*  PLT  --  [id number]*   Basic Metabolic Panel: Recent Labs  Lab 08/09/19 0254 08/09/19 0703  NA 137 141  K 5.7* 4.9  CL 103 103  CO2  --  27  GLUCOSE 127* 111*  BUN 30* 21  CREATININE 1.30* 1.35*  CALCIUM  --  9.8   GFR: Estimated Creatinine Clearance: 50.2 mL/min (A) (by C-G formula based on SCr of 1.35 mg/dL (H)). Liver Function Tests: Recent Labs  Lab 08/09/19 0703  AST 45*  ALT 26  ALKPHOS 70  BILITOT 0.4  PROT 6.8  ALBUMIN 4.1   No results for input(s): LIPASE, AMYLASE in the last 168 hours. No results for input(s): AMMONIA in the last 168  hours. Coagulation Profile: Recent Labs  Lab 08/09/19 0243 08/09/19 0703  INR 1.1 1.1   Cardiac Enzymes: No results for input(s): CKTOTAL, CKMB, CKMBINDEX, TROPONINI in the last 168 hours. BNP (last 3 results) No results for input(s): PROBNP in the last 8760 hours. HbA1C: No results for input(s): HGBA1C in the last 72 hours. CBG: Recent Labs  Lab 08/09/19 0223  GLUCAP 173*   Lipid Profile: No results for input(s): CHOL, HDL, LDLCALC, TRIG, CHOLHDL, LDLDIRECT in the last 72 hours. Thyroid Function Tests: No results for input(s): TSH, T4TOTAL, FREET4, T3FREE, THYROIDAB in the last 72 hours. Anemia Panel: No results for input(s): VITAMINB12, FOLATE, FERRITIN, TIBC, IRON, RETICCTPCT in the last 72 hours. Urine analysis:    Component Value Date/Time   COLORURINE YELLOW 12/08/2018 1017   APPEARANCEUR CLEAR 12/08/2018 1017   LABSPEC 1.020 12/08/2018 1017   PHURINE 6.0 12/08/2018 1017   GLUCOSEU NEGATIVE 12/08/2018 1017   HGBUR TRACE-INTACT (A) 12/08/2018 1017   BILIRUBINUR NEGATIVE 12/08/2018 [place] 12/08/2018 1017   PROTEINUR NEGATIVE 09/05/2018 1213   UROBILINOGEN 0.2 12/08/2018 1017   NITRITE NEGATIVE 12/08/2018 1017   LEUKOCYTESUR NEGATIVE 12/08/2018 1017   Sepsis Labs: No results found for this or any previous visit (from the past 240 hour(s)).   Radiological Exams on Admission: CT Code Stroke CTA Head W/WO contrast  Result Date: 08/09/2019 CLINICAL DATA:  Left-sided weakness with slurred speech EXAM: CT ANGIOGRAPHY HEAD AND NECK CT PERFUSION BRAIN TECHNIQUE: Multidetector CT imaging of the head and neck was performed using the standard protocol during bolus administration of intravenous contrast. Multiplanar CT image reconstructions and MIPs were obtained to evaluate the vascular anatomy. Carotid stenosis measurements (when applicable) are obtained utilizing NASCET criteria, using the distal internal carotid diameter as the denominator. Multiphase CT  imaging of the brain was performed following IV bolus contrast injection. Subsequent parametric  perfusion maps were calculated using RAPID software. CONTRAST:  119mL OMNIPAQUE IOHEXOL 350 MG/ML SOLN COMPARISON:  None. FINDINGS: CTA NECK FINDINGS SKELETON: Multilevel degenerative disc disease and facet hypertrophy without bony spinal canal stenosis. OTHER NECK: Right posterior neck lipoma near the level of the occiput put. Normal thyroid. No pharyngeal or laryngeal mass. UPPER CHEST: No pneumothorax or pleural effusion. No nodules or masses. AORTIC ARCH: There is mild calcific atherosclerosis of the aortic arch. There is no aneurysm, dissection or hemodynamically significant stenosis of the visualized portion of the aorta. Conventional 3 vessel aortic branching pattern. The visualized proximal subclavian arteries are widely patent. RIGHT CAROTID SYSTEM: No dissection, occlusion or aneurysm. Mild atherosclerotic calcification at the carotid bifurcation without hemodynamically significant stenosis. LEFT CAROTID SYSTEM: No dissection, occlusion or aneurysm. Mild atherosclerotic calcification at the carotid bifurcation without hemodynamically significant stenosis. VERTEBRAL ARTERIES: Right dominant configuration. Both origins are clearly patent. There is no dissection, occlusion or flow-limiting stenosis to the skull base (V1-V3 segments). CTA HEAD FINDINGS POSTERIOR CIRCULATION: --Vertebral arteries: Normal V4 segments. --Posterior inferior cerebellar arteries (PICA): Patent origins from the vertebral arteries. --Anterior inferior cerebellar arteries (AICA): Patent origins from the basilar artery. --Basilar artery: Normal. --Superior cerebellar arteries: Normal. --Posterior cerebral arteries: Normal. Both are predominantly supplied by the posterior communicating arteries (p-comm). ANTERIOR CIRCULATION: --Intracranial internal carotid arteries: Atherosclerotic calcification of the internal carotid arteries at the skull  base without hemodynamically significant stenosis. --Anterior cerebral arteries (ACA): Normal. Both A1 segments are present. Patent anterior communicating artery (a-comm). --Middle cerebral arteries (MCA): There is a short segment occlusion at the right M1-2 junction. There is generalized attenuated vascularity throughout the right MCA distribution, predominantly within the inferior distribution corresponding to the old infarct. The left MCA is normal. VENOUS SINUSES: As permitted by contrast timing, patent. ANATOMIC VARIANTS: None Review of the MIP images confirms the above findings. CT Brain Perfusion Findings: ASPECTS: 10 CBF (<30%) Volume: 56mL Perfusion (Tmax>6.0s) volume: 190mL Mismatch Volume: 20mL Infarction Location:Old infarct in the right MCA territory. The reported data is unreliable and predominantly corresponds to the area of old infarct brain. IMPRESSION: 1. Short segment occlusion of the right middle cerebral artery at the M1 M2 junction. Generalized attenuation of the vasculature throughout the right MCA territory is likely chronic in the setting of remote posterior MCA territory infarct. 2. Calculated regions of decreased perfusion are likely unreliable and predominantly correspond to the old infarct site. There may be a small component of anterior extension. 3. Carotid and aortic atherosclerosis without hemodynamically significant stenosis by NASCET criteria. Patent vertebral arteries. These results were called by telephone at the time of interpretation on 08/09/2019 at 2:30 am to provider [facility] , who verbally acknowledged these results. Electronically Signed   By: Ulyses Jarred M.D.   On: 08/09/2019 02:30   CT Code Stroke CTA Neck W/WO contrast  Result Date: 08/09/2019 CLINICAL DATA:  Left-sided weakness with slurred speech EXAM: CT ANGIOGRAPHY HEAD AND NECK CT PERFUSION BRAIN TECHNIQUE: Multidetector CT imaging of the head and neck was performed using the standard protocol during  bolus administration of intravenous contrast. Multiplanar CT image reconstructions and MIPs were obtained to evaluate the vascular anatomy. Carotid stenosis measurements (when applicable) are obtained utilizing NASCET criteria, using the distal internal carotid diameter as the denominator. Multiphase CT imaging of the brain was performed following IV bolus contrast injection. Subsequent parametric perfusion maps were calculated using RAPID software. CONTRAST:  116mL OMNIPAQUE IOHEXOL 350 MG/ML SOLN COMPARISON:  None. FINDINGS: CTA NECK FINDINGS SKELETON:  Multilevel degenerative disc disease and facet hypertrophy without bony spinal canal stenosis. OTHER NECK: Right posterior neck lipoma near the level of the occiput put. Normal thyroid. No pharyngeal or laryngeal mass. UPPER CHEST: No pneumothorax or pleural effusion. No nodules or masses. AORTIC ARCH: There is mild calcific atherosclerosis of the aortic arch. There is no aneurysm, dissection or hemodynamically significant stenosis of the visualized portion of the aorta. Conventional 3 vessel aortic branching pattern. The visualized proximal subclavian arteries are widely patent. RIGHT CAROTID SYSTEM: No dissection, occlusion or aneurysm. Mild atherosclerotic calcification at the carotid bifurcation without hemodynamically significant stenosis. LEFT CAROTID SYSTEM: No dissection, occlusion or aneurysm. Mild atherosclerotic calcification at the carotid bifurcation without hemodynamically significant stenosis. VERTEBRAL ARTERIES: Right dominant configuration. Both origins are clearly patent. There is no dissection, occlusion or flow-limiting stenosis to the skull base (V1-V3 segments). CTA HEAD FINDINGS POSTERIOR CIRCULATION: --Vertebral arteries: Normal V4 segments. --Posterior inferior cerebellar arteries (PICA): Patent origins from the vertebral arteries. --Anterior inferior cerebellar arteries (AICA): Patent origins from the basilar artery. --Basilar artery:  Normal. --Superior cerebellar arteries: Normal. --Posterior cerebral arteries: Normal. Both are predominantly supplied by the posterior communicating arteries (p-comm). ANTERIOR CIRCULATION: --Intracranial internal carotid arteries: Atherosclerotic calcification of the internal carotid arteries at the skull base without hemodynamically significant stenosis. --Anterior cerebral arteries (ACA): Normal. Both A1 segments are present. Patent anterior communicating artery (a-comm). --Middle cerebral arteries (MCA): There is a short segment occlusion at the right M1-2 junction. There is generalized attenuated vascularity throughout the right MCA distribution, predominantly within the inferior distribution corresponding to the old infarct. The left MCA is normal. VENOUS SINUSES: As permitted by contrast timing, patent. ANATOMIC VARIANTS: None Review of the MIP images confirms the above findings. CT Brain Perfusion Findings: ASPECTS: 10 CBF (<30%) Volume: 69mL Perfusion (Tmax>6.0s) volume: 159mL Mismatch Volume: 103mL Infarction Location:Old infarct in the right MCA territory. The reported data is unreliable and predominantly corresponds to the area of old infarct brain. IMPRESSION: 1. Short segment occlusion of the right middle cerebral artery at the M1 M2 junction. Generalized attenuation of the vasculature throughout the right MCA territory is likely chronic in the setting of remote posterior MCA territory infarct. 2. Calculated regions of decreased perfusion are likely unreliable and predominantly correspond to the old infarct site. There may be a small component of anterior extension. 3. Carotid and aortic atherosclerosis without hemodynamically significant stenosis by NASCET criteria. Patent vertebral arteries. These results were called by telephone at the time of interpretation on 08/09/2019 at 2:30 am to provider [facility] , who verbally acknowledged these results. Electronically Signed   By: Ulyses Jarred  M.D.   On: 08/09/2019 02:30   CT Code Stroke Cerebral Perfusion with contrast  Result Date: 08/09/2019 CLINICAL DATA:  Left-sided weakness with slurred speech EXAM: CT ANGIOGRAPHY HEAD AND NECK CT PERFUSION BRAIN TECHNIQUE: Multidetector CT imaging of the head and neck was performed using the standard protocol during bolus administration of intravenous contrast. Multiplanar CT image reconstructions and MIPs were obtained to evaluate the vascular anatomy. Carotid stenosis measurements (when applicable) are obtained utilizing NASCET criteria, using the distal internal carotid diameter as the denominator. Multiphase CT imaging of the brain was performed following IV bolus contrast injection. Subsequent parametric perfusion maps were calculated using RAPID software. CONTRAST:  171mL OMNIPAQUE IOHEXOL 350 MG/ML SOLN COMPARISON:  None. FINDINGS: CTA NECK FINDINGS SKELETON: Multilevel degenerative disc disease and facet hypertrophy without bony spinal canal stenosis. OTHER NECK: Right posterior neck lipoma near the level of the  occiput put. Normal thyroid. No pharyngeal or laryngeal mass. UPPER CHEST: No pneumothorax or pleural effusion. No nodules or masses. AORTIC ARCH: There is mild calcific atherosclerosis of the aortic arch. There is no aneurysm, dissection or hemodynamically significant stenosis of the visualized portion of the aorta. Conventional 3 vessel aortic branching pattern. The visualized proximal subclavian arteries are widely patent. RIGHT CAROTID SYSTEM: No dissection, occlusion or aneurysm. Mild atherosclerotic calcification at the carotid bifurcation without hemodynamically significant stenosis. LEFT CAROTID SYSTEM: No dissection, occlusion or aneurysm. Mild atherosclerotic calcification at the carotid bifurcation without hemodynamically significant stenosis. VERTEBRAL ARTERIES: Right dominant configuration. Both origins are clearly patent. There is no dissection, occlusion or flow-limiting stenosis  to the skull base (V1-V3 segments). CTA HEAD FINDINGS POSTERIOR CIRCULATION: --Vertebral arteries: Normal V4 segments. --Posterior inferior cerebellar arteries (PICA): Patent origins from the vertebral arteries. --Anterior inferior cerebellar arteries (AICA): Patent origins from the basilar artery. --Basilar artery: Normal. --Superior cerebellar arteries: Normal. --Posterior cerebral arteries: Normal. Both are predominantly supplied by the posterior communicating arteries (p-comm). ANTERIOR CIRCULATION: --Intracranial internal carotid arteries: Atherosclerotic calcification of the internal carotid arteries at the skull base without hemodynamically significant stenosis. --Anterior cerebral arteries (ACA): Normal. Both A1 segments are present. Patent anterior communicating artery (a-comm). --Middle cerebral arteries (MCA): There is a short segment occlusion at the right M1-2 junction. There is generalized attenuated vascularity throughout the right MCA distribution, predominantly within the inferior distribution corresponding to the old infarct. The left MCA is normal. VENOUS SINUSES: As permitted by contrast timing, patent. ANATOMIC VARIANTS: None Review of the MIP images confirms the above findings. CT Brain Perfusion Findings: ASPECTS: 10 CBF (<30%) Volume: 80mL Perfusion (Tmax>6.0s) volume: 122mL Mismatch Volume: 91mL Infarction Location:Old infarct in the right MCA territory. The reported data is unreliable and predominantly corresponds to the area of old infarct brain. IMPRESSION: 1. Short segment occlusion of the right middle cerebral artery at the M1 M2 junction. Generalized attenuation of the vasculature throughout the right MCA territory is likely chronic in the setting of remote posterior MCA territory infarct. 2. Calculated regions of decreased perfusion are likely unreliable and predominantly correspond to the old infarct site. There may be a small component of anterior extension. 3. Carotid and aortic  atherosclerosis without hemodynamically significant stenosis by NASCET criteria. Patent vertebral arteries. These results were called by telephone at the time of interpretation on 08/09/2019 at 2:30 am to provider [facility] , who verbally acknowledged these results. Electronically Signed   By: Ulyses Jarred M.D.   On: 08/09/2019 02:30   CT HEAD CODE STROKE WO CONTRAST  Result Date: 08/09/2019 CLINICAL DATA:  Code stroke. Left-sided weakness with slurred speech EXAM: CT HEAD WITHOUT CONTRAST TECHNIQUE: Contiguous axial images were obtained from the base of the skull through the vertex without intravenous contrast. COMPARISON:  09/05/2018 FINDINGS: Brain: There is no mass, hemorrhage or extra-axial collection. There is generalized atrophy without lobar predilection. There is hypoattenuation of the periventricular white matter, most commonly indicating chronic ischemic microangiopathy. There is an old, large posterior right MCA territory infarct. Vascular: Hyperdense right MCA distal M1 and proximal M2 segments. Skull: The visualized skull base, calvarium and extracranial soft tissues are normal. Sinuses/Orbits: No fluid levels or advanced mucosal thickening of the visualized paranasal sinuses. No mastoid or middle ear effusion. The orbits are normal. ASPECTS [facility] Stroke Program Early CT Score) - Ganglionic level infarction (caudate, lentiform nuclei, internal capsule, insula, M1-M3 cortex): 7 - Supraganglionic infarction (M4-M6 cortex): 3 Total score (0-10 with 10 being normal):  10 IMPRESSION: 1. No acute hemorrhage. 2. Hyperdense right middle cerebral artery distal M1 and proximal M2 segments. 3. ASPECTS is 10. 4. These results were called by telephone at the time of interpretation on 08/09/2019 at 1:57 am to provider Roland Rack, who verbally acknowledged these results. Electronically Signed   By: Ulyses Jarred M.D.   On: 08/09/2019 01:58    EKG: Independently reviewed.  Sinus rhythm at 80  bpm with premature atrial complex  Assessment/Plan Right MCA stroke: Acute.  Patient presented after being found down at home by his son last night with left-sided weakness.  On physical exam patient with right gaze preference, some dysarthria, and initially noted to be flaccid on the left side, but appears to have approximately 3/5 muscle strength on the left.  Risk factors include remote history of tobacco use, hypertension, CAD. -Admit to telemetry bed -Stroke order set initiated -Neuro checks -Aspiration precaution -Check  MRI brain -Check echocardiogram -Check Hemoglobin A1c and lipid panel in a.m. -PT/OT/Speech to evaluate and treat -Continue ASA and Plavix -Social work consulted for need of rehab -[place] neurology consultative services, will follow-up for further recommendation  History of seizures: Patient followed in outpatient by Dr. Rexene Alberts of neurology and was last admitted for seizures in 09/2018.  At home patient on 500 mg p.o. twice daily. -Follow-up EEG per neurology -Keppra 500 mg p.o. changed to IV while n.p.o.  Essential hypertension: Blood pressures currently stable.  Allow for permissive hypertension with blood pressure less than 220/120.  Home blood pressure medications appear to clued metoprolol, losartan, hydrochlorothiazide -Restart antihypertensive agents when medically appropriate  Coronary artery disease: Patient with previous history of CABG in 1997. -Continue aspirin, Plavix, and statin  Chronic kidney disease stage III: On admission creatinine 1.35 with BUN 21.  Previously baseline creatinine appears to be around 1.2 in May 2020. -Continue to monitor  Transaminitis, history of hepatitis: Patient presents with AST mildly elevated at 45 and ALT 26.  Past medical history significant for hepatitis.  Son denied any significant history of alcohol abuse.  -Check acute hepatitis  Thrombocytopenia: Acute on chronic.  Platelet count 124.  Review of patient's  record shows that he has had intermittent low platelet counts previously in the past. No significant reports of bleeding. -Continue to monitor  BPH -Continue Flomax when able  Hyperlipidemia: Goal LDL less than 70.  Last LDL was 62 in 5 -Follow-up lipid panel -Continue atorvastatin when able  Morbid obesity: BMI 40.8 kg/m   DVT prophylaxis: scds Code Status: Full  Family Communication: Discussed plan of care over the phone with the patient son. Daughter is POA. Disposition Plan: Will need rehab Consults called: Neurology Admission status: Inpatient  Norval [name] MD [facility] Pager [phone]   If 7PM-7AM, please contact night-coverage [email or web address] Password [facility]  08/09/2019, 7:56 AM

## 2019-08-09 NOTE — ED Notes (Signed)
Please call daughter Gomer Wadley @ [phone] for a status update asap--advised someone from here called and did not leave a message--Samson Ralph

## 2019-08-09 NOTE — ED Provider Notes (Signed)
[facility] EMERGENCY DEPARTMENT Provider Note   CSN: [id number] Arrival date & time: 08/09/19  0142     History Chief Complaint  Patient presents with  . Code Stroke    Andres [name] is a 79 y.o. male.  Patient is a an 79 year old male with past medical history of coronary artery disease with CABG in 1997, diabetes, hypertension.  He was brought by EMS for evaluation of altered mental status.  According to the patient's roommate, he became weak on his left side and began having an inability to speak.  This started at approximately 1130 tonight.  Patient arrived here as a code stroke.  He adds no additional history secondary to acuity of condition.  The history is provided by the EMS personnel.       Past Medical History:  Diagnosis Date  . ALLERGIC RHINITIS   . Benign prostatic hypertrophy   . 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 Last nuc 2012 negative. 07/21/2013  . Carpal tunnel syndrome, bilateral   . Cataracts, bilateral   . Coronary artery disease    Dr. Claiborne Billings; 2D ECHO, 12/11/2011 - EF 50-55%, normal; NUCLEAR STRESS TEST, 10/16/2010 - no evidemce of inducible ischemia  . Diabetes mellitus type II   . Diverticulosis of colon   . GERD (gastroesophageal reflux disease)   . H/O hiatal hernia   . Head mass   . Hepatitis   . Hx of   sessile serrated colonic polyp 02/27/2015  . Hyperlipidemia   . Hypertension   . Inguinal hernia, Rt reduced, Lt present 07/21/2013  . Low back pain   . Mitral valve prolapse 11/24/2013   By echo dec 2014  . Nephrolithiasis   . Obesity, morbid ([place])   . Occlusion and stenosis of carotid artery without mention of cerebral infarction    CAROTID DOPPLER, 03/18/2012 - srable, mild, hard, plaque noted, bilaterally  . Stroke [facility])     Patient Active Problem List   Diagnosis Date Noted  . Left hip pain 03/02/2019  . Syncope 09/05/2018  . Gout 07/10/2018  . Scrotal swelling 06/09/2018  . Urinary dribbling  06/03/2017  . Mass of scrotum 06/03/2017  . Injury of left hand 07/21/2015  . Hx of   sessile serrated colonic polyp 02/27/2015  . Chronic anticoagulation 12/22/2014  . Colon cancer screening 12/22/2014  . Morbid obesity ([place]) 09/29/2014  . Mitral valve prolapse 11/24/2013  . IT band syndrome 09/23/2013  . Hypotension, unspecified 07/21/2013  . Dyspnea 07/21/2013  . Elevated troponin I level- (pk Troponin 0.34 in setting of acute renal insufficency) 07/21/2013  . 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 Last nuc 2012 negative. 07/21/2013  . SBO (small bowel obstruction) ([place]) 07/21/2013  . Inguinal hernia, Rt reduced, Lt present 07/21/2013  . Acute renal failure ([place]) 07/21/2013  . Pneumonia, aspiration ([place]) 07/21/2013  . Obesity (BMI 30-39.9) 01/17/2013  . Osteoarthritis, hand, primary localized 08/12/2012  . Preventative health care 04/11/2011  . MILD COGNITIVE IMPAIRMENT SO STATED 10/03/2010  . CEREBROVASCULAR ACCIDENT, HX OF 10/03/2010  . RASH-NONVESICULAR 05/12/2008  . TRANSIENT ISCHEMIC ATTACK 01/27/2008  . Diabetes ([place]) 12/08/2007  . ALLERGIC RHINITIS 12/08/2007  . DIVERTICULOSIS, COLON 12/08/2007  . LOW BACK PAIN 12/08/2007  . NEPHROLITHIASIS, HX OF 12/08/2007  . BENIGN PROSTATIC HYPERTROPHY 04/05/2007  . Hyperlipidemia 04/02/2007  . CARPAL TUNNEL SYNDROME, BILATERAL 04/02/2007  . CATARACT NOS 04/02/2007  . Essential hypertension 04/02/2007    Past Surgical History:  Procedure Laterality Date  . APPENDECTOMY    . CARDIAC CATHETERIZATION    . CATARACT EXTRACTION Right   . CORONARY ARTERY BYPASS GRAFT    . INGUINAL HERNIA REPAIR Right 07/26/2013   Procedure: REPAIR  INCARCERATED RIGHT INGUINAL  HERNIA ;  Surgeon: Gwenyth Ober, MD;  Location: [facility];  Service: General;  Laterality: Right;       Family History  Problem Relation Age of Onset  . Heart attack Mother   . Diabetes Mother   . Heart attack Brother   . Heart attack Maternal Grandmother     . Hypertension Maternal Grandmother   . Pancreatic cancer Brother   . Kidney disease Brother   . Hypertension Son   . Kidney disease Son        ?  Marland Kitchen Alzheimer's disease Father   . Diabetes Brother   . Diabetes Sister     Social History   Tobacco Use  . Smoking status: Former Smoker    Types: Cigarettes    Quit date: 12/20/1989    Years since quitting: 29.6  . Smokeless tobacco: Never Used  Substance Use Topics  . Alcohol use: No    Alcohol/week: 0.0 standard drinks  . Drug use: No    Home Medications Prior to Admission medications   Medication Sig Start Date End Date Taking? Authorizing Provider  Acetaminophen 500 MG coapsule Take 500 mg by mouth every 6 (six) hours as needed for mild pain. Reported on 07/21/2015    [provider]  Ascorbic Acid (VITAMIN C) 1000 MG tablet Take 1,000 mg by mouth daily.    [provider]  Aspirin (ADULT ASPIRIN LOW STRENGTH) 81 MG EC tablet Take 81 mg by mouth daily.      [provider]  atorvastatin (LIPITOR) 40 MG tablet Take 1 tablet (40 mg total) by mouth daily. 12/02/17   [name], MD  clopidogrel (PLAVIX) 75 MG tablet Take 1 tablet (75 mg total) by mouth daily. 12/02/17   [name], MD  diclofenac sodium (VOLTAREN) 1 % GEL Apply 2 g topically every 6 (six) hours as needed (for arthritis). 03/02/19   [name], MD  folic acid (FOLVITE) 1 MG tablet Take 1 tablet (1 mg total) by mouth daily. 12/02/17   [name], MD  hydrochlorothiazide (MICROZIDE) 12.5 MG capsule Take 1 capsule (12.5 mg total) by mouth daily. 10/04/18 10/04/19  [name], MD  levETIRAcetam (KEPPRA) 500 MG tablet Take 1 tablet (500 mg total) by mouth 2 (two) times daily. 09/16/18   Star Age, MD  losartan (COZAAR) 50 MG tablet Take 1 tablet (50 mg total) by mouth daily. 10/04/18   [name], MD  meloxicam (MOBIC) 7.5 MG tablet Take 1 tablet (7.5 mg total) by mouth daily. 06/25/19   Ann Held, DO  metoprolol tartrate  (LOPRESSOR) 50 MG tablet Take 1 tablet (50 mg total) by mouth 2 (two) times daily. 12/02/17   [name], MD  Multiple Vitamin (MULTIVITAMIN) capsule Take 1 capsule by mouth daily.     [provider]  Omega-3 Fatty Acids (OMEGA 3 PO) Take 360 mg by mouth 2 (two) times daily.    [provider]  tamsulosin (FLOMAX) 0.4 MG CAPS capsule Take 1 capsule (0.4 mg total) by mouth daily. 12/02/17   [name], MD    Allergies    Patient has no known allergies.  Review of Systems   Review of  Systems  Unable to perform ROS: Acuity of condition    Physical Exam Updated Vital Signs Wt 111.2 kg   BMI 40.80 kg/m   Physical Exam Vitals and nursing note reviewed.  Constitutional:      General: He is not in acute distress.    Appearance: He is well-developed. He is not diaphoretic.  HENT:     Head: Normocephalic and atraumatic.  Cardiovascular:     Rate and Rhythm: Normal rate and regular rhythm.     Heart sounds: No murmur. No friction rub.  Pulmonary:     Effort: Pulmonary effort is normal. No respiratory distress.     Breath sounds: Normal breath sounds. No wheezing or rales.  Abdominal:     General: Bowel sounds are normal. There is no distension.     Palpations: Abdomen is soft.     Tenderness: There is no abdominal tenderness.  Musculoskeletal:        General: Normal range of motion.     Cervical back: Normal range of motion and neck supple.  Skin:    General: Skin is warm and dry.  Neurological:     Mental Status: He is alert.     Coordination: Coordination normal.     Comments: Patient is awake, but appears disoriented.  He has a gaze deviation to the right.  He is not following commands.     ED Results / Procedures / Treatments   Labs (all labs ordered are listed, but only abnormal results are displayed) Labs Reviewed  PROTIME-INR  APTT  CBC  DIFFERENTIAL  COMPREHENSIVE METABOLIC PANEL  I-STAT CHEM 8, ED  CBG MONITORING, ED    EKG EKG  Interpretation  Date/Time:  Tuesday August 09 2019 02:28:50 EST Ventricular Rate:  80 PR Interval:    QRS Duration: 126 QT Interval:  388 QTC Calculation: 442 R Axis:   -65 Text Interpretation: Sinus rhythm Atrial premature complex Nonspecific IVCD with LAD Minimal ST elevation, anterolateral leads Baseline wander in lead(s) V6 Confirmed by Veryl Speak [phone]) on 08/09/2019 6:36:39 AM   Radiology No results found.  Procedures Procedures (including critical care time)  Medications Ordered in ED Medications  sodium chloride flush (NS) 0.9 % injection 3 mL (has no administration in time range)    ED Course  I have reviewed the triage vital signs and the nursing notes.  Pertinent labs & imaging results that were available during my care of the patient were reviewed by me and considered in my medical decision making (see chart for details).    MDM Rules/Calculators/A&P  Patient is an 79 year old male brought for evaluation of strokelike symptoms.  Patient is confused and not following commands.  Initially had a gaze deviation to the right.  He arrived here as a code stroke, then went immediately to radiology for a CT scan of the head.  This showed hyperdense right MCA in the distal M1 and proximal M2 segments concerning for acute thrombosis.  Additional imaging studies ordered per Dr. Leonel Ramsay.  He does not feel as though these represent an acute cerebral stroke, however does have concerns of a possible brainstem issue.  He does not feel as though patient has a candidate for thrombolysis as his actual last known well time was actually 1 PM, not the 11:30 PM as initially reported by EMS.  Patient will be admitted to the hospitalist service for further care.  Patient did develop worsening confusion while in the ED and did require Ativan and soft wrist restraints.  CRITICAL CARE Performed by: Veryl Speak Total critical care time: 35 minutes Critical care time was exclusive of  separately billable procedures and treating other patients. Critical care was necessary to treat or prevent imminent or life-threatening deterioration. Critical care was time spent personally by me on the following activities: development of treatment plan with patient and/or surrogate as well as nursing, discussions with consultants, evaluation of patient's response to treatment, examination of patient, obtaining history from patient or surrogate, ordering and performing treatments and interventions, ordering and review of laboratory studies, ordering and review of radiographic studies, pulse oximetry and re-evaluation of patient's condition.   Final Clinical Impression(s) / ED Diagnoses Final diagnoses:  None    Rx / DC Orders ED Discharge Orders    None       Veryl Speak, MD 08/09/19 [phone]

## 2019-08-09 NOTE — ED Notes (Signed)
Attempted to call pt's daughter to update her, but she did not answer. I did leave a message for her to call back.

## 2019-08-09 NOTE — Progress Notes (Signed)
STROKE TEAM PROGRESS NOTE   INTERVAL HISTORY I have personally reviewed history of presenting illness, electronic medical records and imaging films in PACS.  Patient presented with left-sided weakness with unclear last seen normal.  He has prior history of stroke and seizures.  He was not considered a candidate for TPA or intervention due to unclear loss in normal and pre-existing deficits  Vitals:   08/09/19 0229 08/09/19 0715 08/09/19 0901 08/09/19 1129  BP:  (!) 185/95  (!) 154/87  Pulse: 78 90  78  Resp: 17 15  15   Temp:   98.3 F (36.8 C)   TempSrc:   Oral   SpO2: 96% 100%  98%  Weight:        CBC:  Recent Labs  Lab 08/09/19 0254 08/09/19 0703  WBC  --  8.8  NEUTROABS  --  6.3  HGB 13.3 13.8  HCT 39.0 40.2  MCV  --  100.5*  PLT  --  124*    Basic Metabolic Panel:  Recent Labs  Lab 08/09/19 0254 08/09/19 0703  NA 137 141  K 5.7* 4.9  CL 103 103  CO2  --  27  GLUCOSE 127* 111*  BUN 30* 21  CREATININE 1.30* 1.35*  CALCIUM  --  9.8   Lipid Panel:     Component Value Date/Time   CHOL 126 12/08/2018 1017   TRIG 154.0 (H) 12/08/2018 1017   HDL 33.20 (L) 12/08/2018 1017   CHOLHDL 4 12/08/2018 1017   VLDL 30.8 12/08/2018 1017   LDLCALC 62 12/08/2018 1017   HgbA1c:  Lab Results  Component Value Date   HGBA1C 5.6 06/08/2019   Urine Drug Screen:     Component Value Date/Time   LABOPIA NONE DETECTED 09/05/2018 1213   COCAINSCRNUR NONE DETECTED 09/05/2018 1213   LABBENZ NONE DETECTED 09/05/2018 1213   AMPHETMU NONE DETECTED 09/05/2018 1213   THCU NONE DETECTED 09/05/2018 1213   LABBARB NONE DETECTED 09/05/2018 1213    Alcohol Level     Component Value Date/Time   ETH <10 09/05/2018 1055    IMAGING past 48 hours CT Code Stroke CTA Head W/WO contrast  Result Date: 08/09/2019 CLINICAL DATA:  Left-sided weakness with slurred speech EXAM: CT ANGIOGRAPHY HEAD AND NECK CT PERFUSION BRAIN TECHNIQUE: Multidetector CT imaging of the head and neck was  performed using the standard protocol during bolus administration of intravenous contrast. Multiplanar CT image reconstructions and MIPs were obtained to evaluate the vascular anatomy. Carotid stenosis measurements (when applicable) are obtained utilizing NASCET criteria, using the distal internal carotid diameter as the denominator. Multiphase CT imaging of the brain was performed following IV bolus contrast injection. Subsequent parametric perfusion maps were calculated using RAPID software. CONTRAST:  150mL OMNIPAQUE IOHEXOL 350 MG/ML SOLN COMPARISON:  None. FINDINGS: CTA NECK FINDINGS SKELETON: Multilevel degenerative disc disease and facet hypertrophy without bony spinal canal stenosis. OTHER NECK: Right posterior neck lipoma near the level of the occiput put. Normal thyroid. No pharyngeal or laryngeal mass. UPPER CHEST: No pneumothorax or pleural effusion. No nodules or masses. AORTIC ARCH: There is mild calcific atherosclerosis of the aortic arch. There is no aneurysm, dissection or hemodynamically significant stenosis of the visualized portion of the aorta. Conventional 3 vessel aortic branching pattern. The visualized proximal subclavian arteries are widely patent. RIGHT CAROTID SYSTEM: No dissection, occlusion or aneurysm. Mild atherosclerotic calcification at the carotid bifurcation without hemodynamically significant stenosis. LEFT CAROTID SYSTEM: No dissection, occlusion or aneurysm. Mild atherosclerotic calcification at the carotid bifurcation  without hemodynamically significant stenosis. VERTEBRAL ARTERIES: Right dominant configuration. Both origins are clearly patent. There is no dissection, occlusion or flow-limiting stenosis to the skull base (V1-V3 segments). CTA HEAD FINDINGS POSTERIOR CIRCULATION: --Vertebral arteries: Normal V4 segments. --Posterior inferior cerebellar arteries (PICA): Patent origins from the vertebral arteries. --Anterior inferior cerebellar arteries (AICA): Patent origins  from the basilar artery. --Basilar artery: Normal. --Superior cerebellar arteries: Normal. --Posterior cerebral arteries: Normal. Both are predominantly supplied by the posterior communicating arteries (p-comm). ANTERIOR CIRCULATION: --Intracranial internal carotid arteries: Atherosclerotic calcification of the internal carotid arteries at the skull base without hemodynamically significant stenosis. --Anterior cerebral arteries (ACA): Normal. Both A1 segments are present. Patent anterior communicating artery (a-comm). --Middle cerebral arteries (MCA): There is a short segment occlusion at the right M1-2 junction. There is generalized attenuated vascularity throughout the right MCA distribution, predominantly within the inferior distribution corresponding to the old infarct. The left MCA is normal. VENOUS SINUSES: As permitted by contrast timing, patent. ANATOMIC VARIANTS: None Review of the MIP images confirms the above findings. CT Brain Perfusion Findings: ASPECTS: 10 CBF (<30%) Volume: 46mL Perfusion (Tmax>6.0s) volume: 159mL Mismatch Volume: 53mL Infarction Location:Old infarct in the right MCA territory. The reported data is unreliable and predominantly corresponds to the area of old infarct brain. IMPRESSION: 1. Short segment occlusion of the right middle cerebral artery at the M1 M2 junction. Generalized attenuation of the vasculature throughout the right MCA territory is likely chronic in the setting of remote posterior MCA territory infarct. 2. Calculated regions of decreased perfusion are likely unreliable and predominantly correspond to the old infarct site. There may be a small component of anterior extension. 3. Carotid and aortic atherosclerosis without hemodynamically significant stenosis by NASCET criteria. Patent vertebral arteries. These results were called by telephone at the time of interpretation on 08/09/2019 at 2:30 am to provider [facility] , who verbally acknowledged these results.  Electronically Signed   By: Ulyses Jarred M.D.   On: 08/09/2019 02:30   DG Chest 2 View  Result Date: 08/09/2019 CLINICAL DATA:  Stroke workup EXAM: CHEST - 2 VIEW COMPARISON:  09/05/2018 FINDINGS: Interstitial coarsening at the bases similar to prior. This is accentuated from prior in the setting of low lung volumes. Prior CABG with stable heart size. No evidence of effusion or pneumothorax. IMPRESSION: Low volume chest with mild atelectatic type opacity at the bases. Electronically Signed   By: Monte Fantasia M.D.   On: 08/09/2019 08:45   CT Code Stroke CTA Neck W/WO contrast  Result Date: 08/09/2019 CLINICAL DATA:  Left-sided weakness with slurred speech EXAM: CT ANGIOGRAPHY HEAD AND NECK CT PERFUSION BRAIN TECHNIQUE: Multidetector CT imaging of the head and neck was performed using the standard protocol during bolus administration of intravenous contrast. Multiplanar CT image reconstructions and MIPs were obtained to evaluate the vascular anatomy. Carotid stenosis measurements (when applicable) are obtained utilizing NASCET criteria, using the distal internal carotid diameter as the denominator. Multiphase CT imaging of the brain was performed following IV bolus contrast injection. Subsequent parametric perfusion maps were calculated using RAPID software. CONTRAST:  162mL OMNIPAQUE IOHEXOL 350 MG/ML SOLN COMPARISON:  None. FINDINGS: CTA NECK FINDINGS SKELETON: Multilevel degenerative disc disease and facet hypertrophy without bony spinal canal stenosis. OTHER NECK: Right posterior neck lipoma near the level of the occiput put. Normal thyroid. No pharyngeal or laryngeal mass. UPPER CHEST: No pneumothorax or pleural effusion. No nodules or masses. AORTIC ARCH: There is mild calcific atherosclerosis of the aortic arch. There is no aneurysm, dissection  or hemodynamically significant stenosis of the visualized portion of the aorta. Conventional 3 vessel aortic branching pattern. The visualized proximal  subclavian arteries are widely patent. RIGHT CAROTID SYSTEM: No dissection, occlusion or aneurysm. Mild atherosclerotic calcification at the carotid bifurcation without hemodynamically significant stenosis. LEFT CAROTID SYSTEM: No dissection, occlusion or aneurysm. Mild atherosclerotic calcification at the carotid bifurcation without hemodynamically significant stenosis. VERTEBRAL ARTERIES: Right dominant configuration. Both origins are clearly patent. There is no dissection, occlusion or flow-limiting stenosis to the skull base (V1-V3 segments). CTA HEAD FINDINGS POSTERIOR CIRCULATION: --Vertebral arteries: Normal V4 segments. --Posterior inferior cerebellar arteries (PICA): Patent origins from the vertebral arteries. --Anterior inferior cerebellar arteries (AICA): Patent origins from the basilar artery. --Basilar artery: Normal. --Superior cerebellar arteries: Normal. --Posterior cerebral arteries: Normal. Both are predominantly supplied by the posterior communicating arteries (p-comm). ANTERIOR CIRCULATION: --Intracranial internal carotid arteries: Atherosclerotic calcification of the internal carotid arteries at the skull base without hemodynamically significant stenosis. --Anterior cerebral arteries (ACA): Normal. Both A1 segments are present. Patent anterior communicating artery (a-comm). --Middle cerebral arteries (MCA): There is a short segment occlusion at the right M1-2 junction. There is generalized attenuated vascularity throughout the right MCA distribution, predominantly within the inferior distribution corresponding to the old infarct. The left MCA is normal. VENOUS SINUSES: As permitted by contrast timing, patent. ANATOMIC VARIANTS: None Review of the MIP images confirms the above findings. CT Brain Perfusion Findings: ASPECTS: 10 CBF (<30%) Volume: 53mL Perfusion (Tmax>6.0s) volume: 16mL Mismatch Volume: 20mL Infarction Location:Old infarct in the right MCA territory. The reported data is  unreliable and predominantly corresponds to the area of old infarct brain. IMPRESSION: 1. Short segment occlusion of the right middle cerebral artery at the M1 M2 junction. Generalized attenuation of the vasculature throughout the right MCA territory is likely chronic in the setting of remote posterior MCA territory infarct. 2. Calculated regions of decreased perfusion are likely unreliable and predominantly correspond to the old infarct site. There may be a small component of anterior extension. 3. Carotid and aortic atherosclerosis without hemodynamically significant stenosis by NASCET criteria. Patent vertebral arteries. These results were called by telephone at the time of interpretation on 08/09/2019 at 2:30 am to provider [facility] , who verbally acknowledged these results. Electronically Signed   By: Ulyses Jarred M.D.   On: 08/09/2019 02:30   MR BRAIN WO CONTRAST  Result Date: 08/09/2019 CLINICAL DATA:  Stroke EXAM: MRI HEAD WITHOUT CONTRAST TECHNIQUE: Multiplanar, multiecho pulse sequences of the brain and surrounding structures were obtained without intravenous contrast. COMPARISON:  MRI head 09/06/2018. CT perfusion and CTA head 08/09/2019 FINDINGS: Brain: Chronic infarct right parietal lobe. Large area of acute infarct surrounding the chronic infarct. Acute infarct in the right posterior temporal lobe extending into the right frontal and parietal lobe. Acute infarct right insula. Sparing of the basal ganglia. Acute infarct is primarily cortical in location with minimal white matter component. Mild hemorrhage within the chronic infarct as noted previously. Ventricle size normal. No midline shift. Chronic microvascular ischemic changes throughout the white matter. Vascular: Normal arterial flow voids at the base of brain. Skull and upper cervical spine: Negative. Lipoma in the right posterior occipital scalp. Sinuses/Orbits: Mucosal edema paranasal sinuses with air-fluid level left maxillary  sinus. Left cataract surgery. Other: Motion degraded study. IMPRESSION: Large area of acute infarct right MCA territory surrounding the chronic infarct in the right parietal lobe. Moderate chronic microvascular ischemic changes. Motion degraded study. Electronically Signed   By: Franchot Gallo M.D.   On: 08/09/2019  14:12   CT Code Stroke Cerebral Perfusion with contrast  Result Date: 08/09/2019 CLINICAL DATA:  Left-sided weakness with slurred speech EXAM: CT ANGIOGRAPHY HEAD AND NECK CT PERFUSION BRAIN TECHNIQUE: Multidetector CT imaging of the head and neck was performed using the standard protocol during bolus administration of intravenous contrast. Multiplanar CT image reconstructions and MIPs were obtained to evaluate the vascular anatomy. Carotid stenosis measurements (when applicable) are obtained utilizing NASCET criteria, using the distal internal carotid diameter as the denominator. Multiphase CT imaging of the brain was performed following IV bolus contrast injection. Subsequent parametric perfusion maps were calculated using RAPID software. CONTRAST:  193mL OMNIPAQUE IOHEXOL 350 MG/ML SOLN COMPARISON:  None. FINDINGS: CTA NECK FINDINGS SKELETON: Multilevel degenerative disc disease and facet hypertrophy without bony spinal canal stenosis. OTHER NECK: Right posterior neck lipoma near the level of the occiput put. Normal thyroid. No pharyngeal or laryngeal mass. UPPER CHEST: No pneumothorax or pleural effusion. No nodules or masses. AORTIC ARCH: There is mild calcific atherosclerosis of the aortic arch. There is no aneurysm, dissection or hemodynamically significant stenosis of the visualized portion of the aorta. Conventional 3 vessel aortic branching pattern. The visualized proximal subclavian arteries are widely patent. RIGHT CAROTID SYSTEM: No dissection, occlusion or aneurysm. Mild atherosclerotic calcification at the carotid bifurcation without hemodynamically significant stenosis. LEFT CAROTID  SYSTEM: No dissection, occlusion or aneurysm. Mild atherosclerotic calcification at the carotid bifurcation without hemodynamically significant stenosis. VERTEBRAL ARTERIES: Right dominant configuration. Both origins are clearly patent. There is no dissection, occlusion or flow-limiting stenosis to the skull base (V1-V3 segments). CTA HEAD FINDINGS POSTERIOR CIRCULATION: --Vertebral arteries: Normal V4 segments. --Posterior inferior cerebellar arteries (PICA): Patent origins from the vertebral arteries. --Anterior inferior cerebellar arteries (AICA): Patent origins from the basilar artery. --Basilar artery: Normal. --Superior cerebellar arteries: Normal. --Posterior cerebral arteries: Normal. Both are predominantly supplied by the posterior communicating arteries (p-comm). ANTERIOR CIRCULATION: --Intracranial internal carotid arteries: Atherosclerotic calcification of the internal carotid arteries at the skull base without hemodynamically significant stenosis. --Anterior cerebral arteries (ACA): Normal. Both A1 segments are present. Patent anterior communicating artery (a-comm). --Middle cerebral arteries (MCA): There is a short segment occlusion at the right M1-2 junction. There is generalized attenuated vascularity throughout the right MCA distribution, predominantly within the inferior distribution corresponding to the old infarct. The left MCA is normal. VENOUS SINUSES: As permitted by contrast timing, patent. ANATOMIC VARIANTS: None Review of the MIP images confirms the above findings. CT Brain Perfusion Findings: ASPECTS: 10 CBF (<30%) Volume: 82mL Perfusion (Tmax>6.0s) volume: 168mL Mismatch Volume: 56mL Infarction Location:Old infarct in the right MCA territory. The reported data is unreliable and predominantly corresponds to the area of old infarct brain. IMPRESSION: 1. Short segment occlusion of the right middle cerebral artery at the M1 M2 junction. Generalized attenuation of the vasculature throughout  the right MCA territory is likely chronic in the setting of remote posterior MCA territory infarct. 2. Calculated regions of decreased perfusion are likely unreliable and predominantly correspond to the old infarct site. There may be a small component of anterior extension. 3. Carotid and aortic atherosclerosis without hemodynamically significant stenosis by NASCET criteria. Patent vertebral arteries. These results were called by telephone at the time of interpretation on 08/09/2019 at 2:30 am to provider [facility] , who verbally acknowledged these results. Electronically Signed   By: Ulyses Jarred M.D.   On: 08/09/2019 02:30   CT HEAD CODE STROKE WO CONTRAST  Result Date: 08/09/2019 CLINICAL DATA:  Code stroke. Left-sided weakness with slurred speech  EXAM: CT HEAD WITHOUT CONTRAST TECHNIQUE: Contiguous axial images were obtained from the base of the skull through the vertex without intravenous contrast. COMPARISON:  09/05/2018 FINDINGS: Brain: There is no mass, hemorrhage or extra-axial collection. There is generalized atrophy without lobar predilection. There is hypoattenuation of the periventricular white matter, most commonly indicating chronic ischemic microangiopathy. There is an old, large posterior right MCA territory infarct. Vascular: Hyperdense right MCA distal M1 and proximal M2 segments. Skull: The visualized skull base, calvarium and extracranial soft tissues are normal. Sinuses/Orbits: No fluid levels or advanced mucosal thickening of the visualized paranasal sinuses. No mastoid or middle ear effusion. The orbits are normal. ASPECTS [facility] Stroke Program Early CT Score) - Ganglionic level infarction (caudate, lentiform nuclei, internal capsule, insula, M1-M3 cortex): 7 - Supraganglionic infarction (M4-M6 cortex): 3 Total score (0-10 with 10 being normal): 10 IMPRESSION: 1. No acute hemorrhage. 2. Hyperdense right middle cerebral artery distal M1 and proximal M2 segments. 3. ASPECTS is 10.  4. These results were called by telephone at the time of interpretation on 08/09/2019 at 1:57 am to provider Roland Rack, who verbally acknowledged these results. Electronically Signed   By: Ulyses Jarred M.D.   On: 08/09/2019 01:58    PHYSICAL EXAM Elderly African-American male not in distress. . Afebrile. Head is nontraumatic. Neck is supple without bruit.    Cardiac exam no murmur or gallop. Lungs are clear to auscultation. Distal pulses are well felt. Neurological Exam ; Patient is awake alert he has dysarthric speech but can be understood.  Follows simple midline and one-step commands.    He blinks to threat on the right but not on the left.  He has moderate left lower facial weakness.  He has right gaze preference and can look to the left only through midline.  Tongue is midline.  He has left hemiparesis but withdraws left upper and lower extremity to painful stimuli only.  Is unable to lift left upper and lower extremity against gravity.  He has purposeful movements on the right side without weakness.  ASSESSMENT/PLAN [name] is a 79 y.o. male with history of stroke, HTN, HLD, DB, CAD, obesity, carotid disease, seizures  found down by son at home confused with L sided weakness.   Stroke:   Acute R MCA infarct surrounding of R MCA parietal infarct, due to right M1 occlusion  Code Stroke CT head Hyperdense R MCA distal M1 and proximal M2. ASPECTS 10.     CTA head R M1 M2 short segment occlusion w/ generalized attentuation R MCA.   CTA neck ICA atherosclerosis w/o stenosis   CT perfusion area c/w old infarct, possible small acute anterior division   MRI  Large R MCA infarct surrounding old R MCA parietal infarct   2D Echo pending   LDL 62  HgbA1c 5.6  SCDs for VTE prophylaxis  aspirin 81 mg daily and clopidogrel 75 mg daily prior to admission, now on aspirin 81 mg daily and clopidogrel 75 mg daily. Given inability to swallow, will add aspirin suppository for now.    Therapy recommendations:  pending   Disposition:  pending   Hx Seizures  EEG pending   On Keppra 500 bid  Admitted w/ seizures 09/2018  Followed by Dr. Rexene Alberts  Hypertension  Elevated but Stable . Permissive hypertension (OK if < 220/120) but gradually normalize in 5-7 days . Long-term BP goal normotensive  Hyperlipidemia  Home meds:  lipitor 40 and omega 3  Resume statin in hospital once  po access  LDL 62, goal < 70  Continue statin at discharge  Diabetes type II Controlled  HgbA1c 5.6, goal < 7.0  CBGs Recent Labs    08/09/19 0223  GLUCAP 173*      SSI  Dysphagia . Secondary to stroke . Failed swallow . NPO . Speech on board   Other Stroke Risk Factors  Advanced age  Former Cigarette smoker, quit 29 yrs ago  Morbid Obesity, Body mass index is 40.8 kg/m., recommend weight loss, diet and exercise as appropriate   Hx stroke/TIA  02/2008 - R MCA infarct w/ petechial hemorrhage    Coronary artery disease s/p CABG  Other Active Problems  CKD Cre 1.35  Thrombocytopenia PLT 124  Hospital day # 0  I have personally obtained history,examined this patient, reviewed notes, independently viewed imaging studies, participated in medical decision making and plan of care.ROS completed by me personally and pertinent positives fully documented  I have made any additions or clarifications directly to the above note.  He presented with left hemiplegia with worsening of her deficits secondary to right MCA infarct and extension of his old infarct and likely right MCA occlusion from an underlying chronic  intracranial stenosis.  Recommend dual antiplatelet therapy for 3 months.  Speech therapy to check swallow eval.  Physical occupational therapy consults.  Patient may not be able to swallow and may need feeding tube.  Family to decide on goals of care.  Discussed with Dr. Fuller Plan, MD.  Greater than 50% time during this 35-minute visit was spent on counseling  and coordination of care and answering questions Antony Contras, MD Medical Director [place] Pager: [phone] 08/09/2019 4:54 PM   To contact Stroke Continuity provider, please refer to [email or web address] After hours, contact General Neurology

## 2019-08-10 ENCOUNTER — Ambulatory Visit: Payer: Medicare HMO | Admitting: Podiatry

## 2019-08-10 DIAGNOSIS — I6300 Cerebral infarction due to thrombosis of unspecified precerebral artery: Secondary | ICD-10-CM

## 2019-08-10 LAB — CBC
HCT: 36 % — ABNORMAL LOW (ref 39.0–52.0)
Hemoglobin: 12.5 g/dL — ABNORMAL LOW (ref 13.0–17.0)
MCH: 34.5 pg — ABNORMAL HIGH (ref 26.0–34.0)
MCHC: 34.7 g/dL (ref 30.0–36.0)
MCV: 99.4 fL (ref 80.0–100.0)
Platelets: 174 10*3/uL (ref 150–400)
RBC: 3.62 MIL/uL — ABNORMAL LOW (ref 4.22–5.81)
RDW: 12.8 % (ref 11.5–15.5)
WBC: 7 10*3/uL (ref 4.0–10.5)
nRBC: 0 % (ref 0.0–0.2)

## 2019-08-10 LAB — HEMOGLOBIN A1C
Hgb A1c MFr Bld: 6.2 % — ABNORMAL HIGH (ref 4.8–5.6)
Mean Plasma Glucose: 131.24 mg/dL

## 2019-08-10 LAB — VITAMIN B12: Vitamin B-12: 595 pg/mL (ref 180–914)

## 2019-08-10 LAB — LIPID PANEL
Cholesterol: 152 mg/dL (ref 0–200)
HDL: 36 mg/dL — ABNORMAL LOW (ref >40)
LDL Cholesterol: 100 mg/dL — ABNORMAL HIGH (ref 0–99)
Total CHOL/HDL Ratio: 4.2 RATIO
Triglycerides: 80 mg/dL (ref <150)
VLDL: 16 mg/dL (ref 0–40)

## 2019-08-10 LAB — GLUCOSE, CAPILLARY: Glucose-Capillary: 109 mg/dL — ABNORMAL HIGH (ref 70–99)

## 2019-08-10 MED ORDER — LORAZEPAM 2 MG/ML IJ SOLN
0.5000 mg | Freq: Once | INTRAMUSCULAR | Status: AC
  Administered 2019-08-10: 0.5 mg via INTRAVENOUS
  Filled 2019-08-10: qty 1

## 2019-08-10 NOTE — ED Notes (Signed)
Pt very restless trying to get out bed over the bed rails, pt placed on a hospital bed with not change on behavior, admitting provider notified to try to get some Ativan IV, order for soft restrain gotten, pt more restless on restrain and  Unable to rest, admitting provider notified again for more orders.

## 2019-08-10 NOTE — Progress Notes (Signed)
STROKE TEAM PROGRESS NOTE   INTERVAL HISTORY He still in ER bed. Remains intermittently agitated and received ativan for sedation but can be aroused to follow commands. Echo is normal. LDL is 100 mg % and HbA1c is 6.2%.  Vitals:   08/10/19 0615 08/10/19 0630 08/10/19 0700 08/10/19 0800  BP: (!) 143/83 (!) 158/94 (!) 147/82 (!) 147/85  Pulse:  98 99 86  Resp: 18 20 19  (!) 23  Temp:      TempSrc:      SpO2:  95% (!) 88% (!) 88%  Weight:        CBC:  Recent Labs  Lab 08/09/19 0703 08/10/19 0316  WBC 8.8 7.0  NEUTROABS 6.3  --   HGB 13.8 12.5*  HCT 40.2 36.0*  MCV 100.5* 99.4  PLT 124* [id number]    Basic Metabolic Panel:  Recent Labs  Lab 08/09/19 0254 08/09/19 0703  NA 137 141  K 5.7* 4.9  CL 103 103  CO2  --  27  GLUCOSE 127* 111*  BUN 30* 21  CREATININE 1.30* 1.35*  CALCIUM  --  9.8   Lipid Panel:     Component Value Date/Time   CHOL 152 08/10/2019 0316   TRIG 80 08/10/2019 0316   HDL 36 (L) 08/10/2019 0316   CHOLHDL 4.2 08/10/2019 0316   VLDL 16 08/10/2019 0316   LDLCALC 100 (H) 08/10/2019 0316   HgbA1c:  Lab Results  Component Value Date   HGBA1C 6.2 (H) 08/10/2019   Urine Drug Screen:     Component Value Date/Time   LABOPIA NONE DETECTED 09/05/2018 1213   COCAINSCRNUR NONE DETECTED 09/05/2018 1213   LABBENZ NONE DETECTED 09/05/2018 1213   AMPHETMU NONE DETECTED 09/05/2018 1213   THCU NONE DETECTED 09/05/2018 1213   LABBARB NONE DETECTED 09/05/2018 1213    Alcohol Level     Component Value Date/Time   ETH <10 09/05/2018 1055    IMAGING past 48 hours CT Code Stroke CTA Head W/WO contrast  Result Date: 08/09/2019 CLINICAL DATA:  Left-sided weakness with slurred speech EXAM: CT ANGIOGRAPHY HEAD AND NECK CT PERFUSION BRAIN TECHNIQUE: Multidetector CT imaging of the head and neck was performed using the standard protocol during bolus administration of intravenous contrast. Multiplanar CT image reconstructions and MIPs were obtained to evaluate the  vascular anatomy. Carotid stenosis measurements (when applicable) are obtained utilizing NASCET criteria, using the distal internal carotid diameter as the denominator. Multiphase CT imaging of the brain was performed following IV bolus contrast injection. Subsequent parametric perfusion maps were calculated using RAPID software. CONTRAST:  160mL OMNIPAQUE IOHEXOL 350 MG/ML SOLN COMPARISON:  None. FINDINGS: CTA NECK FINDINGS SKELETON: Multilevel degenerative disc disease and facet hypertrophy without bony spinal canal stenosis. OTHER NECK: Right posterior neck lipoma near the level of the occiput put. Normal thyroid. No pharyngeal or laryngeal mass. UPPER CHEST: No pneumothorax or pleural effusion. No nodules or masses. AORTIC ARCH: There is mild calcific atherosclerosis of the aortic arch. There is no aneurysm, dissection or hemodynamically significant stenosis of the visualized portion of the aorta. Conventional 3 vessel aortic branching pattern. The visualized proximal subclavian arteries are widely patent. RIGHT CAROTID SYSTEM: No dissection, occlusion or aneurysm. Mild atherosclerotic calcification at the carotid bifurcation without hemodynamically significant stenosis. LEFT CAROTID SYSTEM: No dissection, occlusion or aneurysm. Mild atherosclerotic calcification at the carotid bifurcation without hemodynamically significant stenosis. VERTEBRAL ARTERIES: Right dominant configuration. Both origins are clearly patent. There is no dissection, occlusion or flow-limiting stenosis to the skull base (V1-V3  segments). CTA HEAD FINDINGS POSTERIOR CIRCULATION: --Vertebral arteries: Normal V4 segments. --Posterior inferior cerebellar arteries (PICA): Patent origins from the vertebral arteries. --Anterior inferior cerebellar arteries (AICA): Patent origins from the basilar artery. --Basilar artery: Normal. --Superior cerebellar arteries: Normal. --Posterior cerebral arteries: Normal. Both are predominantly supplied by the  posterior communicating arteries (p-comm). ANTERIOR CIRCULATION: --Intracranial internal carotid arteries: Atherosclerotic calcification of the internal carotid arteries at the skull base without hemodynamically significant stenosis. --Anterior cerebral arteries (ACA): Normal. Both A1 segments are present. Patent anterior communicating artery (a-comm). --Middle cerebral arteries (MCA): There is a short segment occlusion at the right M1-2 junction. There is generalized attenuated vascularity throughout the right MCA distribution, predominantly within the inferior distribution corresponding to the old infarct. The left MCA is normal. VENOUS SINUSES: As permitted by contrast timing, patent. ANATOMIC VARIANTS: None Review of the MIP images confirms the above findings. CT Brain Perfusion Findings: ASPECTS: 10 CBF (<30%) Volume: 72mL Perfusion (Tmax>6.0s) volume: 176mL Mismatch Volume: 27mL Infarction Location:Old infarct in the right MCA territory. The reported data is unreliable and predominantly corresponds to the area of old infarct brain. IMPRESSION: 1. Short segment occlusion of the right middle cerebral artery at the M1 M2 junction. Generalized attenuation of the vasculature throughout the right MCA territory is likely chronic in the setting of remote posterior MCA territory infarct. 2. Calculated regions of decreased perfusion are likely unreliable and predominantly correspond to the old infarct site. There may be a small component of anterior extension. 3. Carotid and aortic atherosclerosis without hemodynamically significant stenosis by NASCET criteria. Patent vertebral arteries. These results were called by telephone at the time of interpretation on 08/09/2019 at 2:30 am to provider [facility] , who verbally acknowledged these results. Electronically Signed   By: Ulyses Jarred M.D.   On: 08/09/2019 02:30   DG Chest 2 View  Result Date: 08/09/2019 CLINICAL DATA:  Stroke workup EXAM: CHEST - 2 VIEW  COMPARISON:  09/05/2018 FINDINGS: Interstitial coarsening at the bases similar to prior. This is accentuated from prior in the setting of low lung volumes. Prior CABG with stable heart size. No evidence of effusion or pneumothorax. IMPRESSION: Low volume chest with mild atelectatic type opacity at the bases. Electronically Signed   By: Monte Fantasia M.D.   On: 08/09/2019 08:45   CT Code Stroke CTA Neck W/WO contrast  Result Date: 08/09/2019 CLINICAL DATA:  Left-sided weakness with slurred speech EXAM: CT ANGIOGRAPHY HEAD AND NECK CT PERFUSION BRAIN TECHNIQUE: Multidetector CT imaging of the head and neck was performed using the standard protocol during bolus administration of intravenous contrast. Multiplanar CT image reconstructions and MIPs were obtained to evaluate the vascular anatomy. Carotid stenosis measurements (when applicable) are obtained utilizing NASCET criteria, using the distal internal carotid diameter as the denominator. Multiphase CT imaging of the brain was performed following IV bolus contrast injection. Subsequent parametric perfusion maps were calculated using RAPID software. CONTRAST:  164mL OMNIPAQUE IOHEXOL 350 MG/ML SOLN COMPARISON:  None. FINDINGS: CTA NECK FINDINGS SKELETON: Multilevel degenerative disc disease and facet hypertrophy without bony spinal canal stenosis. OTHER NECK: Right posterior neck lipoma near the level of the occiput put. Normal thyroid. No pharyngeal or laryngeal mass. UPPER CHEST: No pneumothorax or pleural effusion. No nodules or masses. AORTIC ARCH: There is mild calcific atherosclerosis of the aortic arch. There is no aneurysm, dissection or hemodynamically significant stenosis of the visualized portion of the aorta. Conventional 3 vessel aortic branching pattern. The visualized proximal subclavian arteries are widely patent. RIGHT CAROTID  SYSTEM: No dissection, occlusion or aneurysm. Mild atherosclerotic calcification at the carotid bifurcation without  hemodynamically significant stenosis. LEFT CAROTID SYSTEM: No dissection, occlusion or aneurysm. Mild atherosclerotic calcification at the carotid bifurcation without hemodynamically significant stenosis. VERTEBRAL ARTERIES: Right dominant configuration. Both origins are clearly patent. There is no dissection, occlusion or flow-limiting stenosis to the skull base (V1-V3 segments). CTA HEAD FINDINGS POSTERIOR CIRCULATION: --Vertebral arteries: Normal V4 segments. --Posterior inferior cerebellar arteries (PICA): Patent origins from the vertebral arteries. --Anterior inferior cerebellar arteries (AICA): Patent origins from the basilar artery. --Basilar artery: Normal. --Superior cerebellar arteries: Normal. --Posterior cerebral arteries: Normal. Both are predominantly supplied by the posterior communicating arteries (p-comm). ANTERIOR CIRCULATION: --Intracranial internal carotid arteries: Atherosclerotic calcification of the internal carotid arteries at the skull base without hemodynamically significant stenosis. --Anterior cerebral arteries (ACA): Normal. Both A1 segments are present. Patent anterior communicating artery (a-comm). --Middle cerebral arteries (MCA): There is a short segment occlusion at the right M1-2 junction. There is generalized attenuated vascularity throughout the right MCA distribution, predominantly within the inferior distribution corresponding to the old infarct. The left MCA is normal. VENOUS SINUSES: As permitted by contrast timing, patent. ANATOMIC VARIANTS: None Review of the MIP images confirms the above findings. CT Brain Perfusion Findings: ASPECTS: 10 CBF (<30%) Volume: 45mL Perfusion (Tmax>6.0s) volume: 160mL Mismatch Volume: 2mL Infarction Location:Old infarct in the right MCA territory. The reported data is unreliable and predominantly corresponds to the area of old infarct brain. IMPRESSION: 1. Short segment occlusion of the right middle cerebral artery at the M1 M2 junction.  Generalized attenuation of the vasculature throughout the right MCA territory is likely chronic in the setting of remote posterior MCA territory infarct. 2. Calculated regions of decreased perfusion are likely unreliable and predominantly correspond to the old infarct site. There may be a small component of anterior extension. 3. Carotid and aortic atherosclerosis without hemodynamically significant stenosis by NASCET criteria. Patent vertebral arteries. These results were called by telephone at the time of interpretation on 08/09/2019 at 2:30 am to provider [facility] , who verbally acknowledged these results. Electronically Signed   By: Ulyses Jarred M.D.   On: 08/09/2019 02:30   MR BRAIN WO CONTRAST  Result Date: 08/09/2019 CLINICAL DATA:  Stroke EXAM: MRI HEAD WITHOUT CONTRAST TECHNIQUE: Multiplanar, multiecho pulse sequences of the brain and surrounding structures were obtained without intravenous contrast. COMPARISON:  MRI head 09/06/2018. CT perfusion and CTA head 08/09/2019 FINDINGS: Brain: Chronic infarct right parietal lobe. Large area of acute infarct surrounding the chronic infarct. Acute infarct in the right posterior temporal lobe extending into the right frontal and parietal lobe. Acute infarct right insula. Sparing of the basal ganglia. Acute infarct is primarily cortical in location with minimal white matter component. Mild hemorrhage within the chronic infarct as noted previously. Ventricle size normal. No midline shift. Chronic microvascular ischemic changes throughout the white matter. Vascular: Normal arterial flow voids at the base of brain. Skull and upper cervical spine: Negative. Lipoma in the right posterior occipital scalp. Sinuses/Orbits: Mucosal edema paranasal sinuses with air-fluid level left maxillary sinus. Left cataract surgery. Other: Motion degraded study. IMPRESSION: Large area of acute infarct right MCA territory surrounding the chronic infarct in the right parietal  lobe. Moderate chronic microvascular ischemic changes. Motion degraded study. Electronically Signed   By: Franchot Gallo M.D.   On: 08/09/2019 14:12   CT Code Stroke Cerebral Perfusion with contrast  Result Date: 08/09/2019 CLINICAL DATA:  Left-sided weakness with slurred speech EXAM: CT ANGIOGRAPHY HEAD AND  NECK CT PERFUSION BRAIN TECHNIQUE: Multidetector CT imaging of the head and neck was performed using the standard protocol during bolus administration of intravenous contrast. Multiplanar CT image reconstructions and MIPs were obtained to evaluate the vascular anatomy. Carotid stenosis measurements (when applicable) are obtained utilizing NASCET criteria, using the distal internal carotid diameter as the denominator. Multiphase CT imaging of the brain was performed following IV bolus contrast injection. Subsequent parametric perfusion maps were calculated using RAPID software. CONTRAST:  141mL OMNIPAQUE IOHEXOL 350 MG/ML SOLN COMPARISON:  None. FINDINGS: CTA NECK FINDINGS SKELETON: Multilevel degenerative disc disease and facet hypertrophy without bony spinal canal stenosis. OTHER NECK: Right posterior neck lipoma near the level of the occiput put. Normal thyroid. No pharyngeal or laryngeal mass. UPPER CHEST: No pneumothorax or pleural effusion. No nodules or masses. AORTIC ARCH: There is mild calcific atherosclerosis of the aortic arch. There is no aneurysm, dissection or hemodynamically significant stenosis of the visualized portion of the aorta. Conventional 3 vessel aortic branching pattern. The visualized proximal subclavian arteries are widely patent. RIGHT CAROTID SYSTEM: No dissection, occlusion or aneurysm. Mild atherosclerotic calcification at the carotid bifurcation without hemodynamically significant stenosis. LEFT CAROTID SYSTEM: No dissection, occlusion or aneurysm. Mild atherosclerotic calcification at the carotid bifurcation without hemodynamically significant stenosis. VERTEBRAL ARTERIES:  Right dominant configuration. Both origins are clearly patent. There is no dissection, occlusion or flow-limiting stenosis to the skull base (V1-V3 segments). CTA HEAD FINDINGS POSTERIOR CIRCULATION: --Vertebral arteries: Normal V4 segments. --Posterior inferior cerebellar arteries (PICA): Patent origins from the vertebral arteries. --Anterior inferior cerebellar arteries (AICA): Patent origins from the basilar artery. --Basilar artery: Normal. --Superior cerebellar arteries: Normal. --Posterior cerebral arteries: Normal. Both are predominantly supplied by the posterior communicating arteries (p-comm). ANTERIOR CIRCULATION: --Intracranial internal carotid arteries: Atherosclerotic calcification of the internal carotid arteries at the skull base without hemodynamically significant stenosis. --Anterior cerebral arteries (ACA): Normal. Both A1 segments are present. Patent anterior communicating artery (a-comm). --Middle cerebral arteries (MCA): There is a short segment occlusion at the right M1-2 junction. There is generalized attenuated vascularity throughout the right MCA distribution, predominantly within the inferior distribution corresponding to the old infarct. The left MCA is normal. VENOUS SINUSES: As permitted by contrast timing, patent. ANATOMIC VARIANTS: None Review of the MIP images confirms the above findings. CT Brain Perfusion Findings: ASPECTS: 10 CBF (<30%) Volume: 32mL Perfusion (Tmax>6.0s) volume: 171mL Mismatch Volume: 42mL Infarction Location:Old infarct in the right MCA territory. The reported data is unreliable and predominantly corresponds to the area of old infarct brain. IMPRESSION: 1. Short segment occlusion of the right middle cerebral artery at the M1 M2 junction. Generalized attenuation of the vasculature throughout the right MCA territory is likely chronic in the setting of remote posterior MCA territory infarct. 2. Calculated regions of decreased perfusion are likely unreliable and  predominantly correspond to the old infarct site. There may be a small component of anterior extension. 3. Carotid and aortic atherosclerosis without hemodynamically significant stenosis by NASCET criteria. Patent vertebral arteries. These results were called by telephone at the time of interpretation on 08/09/2019 at 2:30 am to provider [facility] , who verbally acknowledged these results. Electronically Signed   By: Ulyses Jarred M.D.   On: 08/09/2019 02:30   DG Abd Portable 1V  Result Date: 08/09/2019 CLINICAL DATA:  Incontinence EXAM: PORTABLE ABDOMEN - 1 VIEW COMPARISON:  July 26, 2013 FINDINGS: The exam is nearly nondiagnostic secondary to underpenetration. The visualized bowel gas pattern is nonobstructive and nonspecific. There is no definite acute osseous  abnormality on this study. Vascular calcifications are noted. IMPRESSION: 1. Suboptimal exam as detailed above. 2. No definite acute abnormality. Electronically Signed   By: Constance Holster M.D.   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 EEG to evaluate for seizure Level of alertness: awake AEDs during EEG study: [place] Technical aspects: This EEG study was done with scalp electrodes positioned according to the 10-20 International system of electrode placement. Electrical activity was acquired at a sampling rate of 500Hz  and reviewed with a high frequency filter of 70Hz  and a low frequency filter of 1Hz . EEG data were recorded continuously and digitally stored. DESCRIPTION: No clear posterior dominant rhythm was seen. EEG showed continuous generalized and lateralized right hemisphere 3-6hz  theta-delta slowing.  Hyperventilation and photic stimulation were not performed. ABNORMALITY - Continuous slow, generalized and lateralized right hemisphere IMPRESSION: This study is suggestive of cortical dysfunction in right hemisphere consistent with underlying infarct. Additionally, there is evidence of moderate diffuse encephalopathy. No seizures or epileptiform discharges were seen throughout the recording. Lora Havens   ECHOCARDIOGRAM COMPLETE  Result Date: 08/09/2019   ECHOCARDIOGRAM REPORT   Patient Name:   DARTANIAN FAHRENKRUG Date of Exam: 08/09/2019 Medical Rec #:  [id number]       Height:       65.0 in Accession #:    [id number]      Weight:       245.1 lb Date of Birth:  11-03-1940       BSA:          2.16 m Patient Age:    79 years        BP:           185/95 mmHg Patient Gender: M               HR:           86 bpm. Exam Location:  Inpatient Procedure: 2D Echo, Cardiac Doppler and Color Doppler Indications:    Stroke 434.91  History:        Patient has prior history of Echocardiogram examinations, most                 recent 09/06/2018. CAD, Stroke, Mitral Valve Prolapse,                 Signs/Symptoms:Dyspnea and Syncope; Risk Factors:Hypertension,                 Diabetes, Dyslipidemia and Former Smoker. Elevated troponin.  Sonographer:    Paulita Fujita RDCS Referring Phys: [id number] [facility] A SMITH  Sonographer Comments: Suboptimal subcostal window. Image acquisition challenging due to respiratory motion. IMPRESSIONS  1. Left ventricular ejection fraction, by visual estimation, is 60%. The left ventricle has normal function. There is no left ventricular hypertrophy.  2. Abnormal septal motion secondary to conduction delay.  3. Elevated left ventricular end-diastolic pressure.  4. Left ventricular diastolic parameters are consistent with Grade II diastolic dysfunction (pseudonormalization).  5. Global right ventricle has normal systolic function.The right ventricular size is normal. No increase in right ventricular wall  thickness.  6. Left atrial size was normal.  7. Right atrial size was normal.  8. The mitral valve  is abnormal. Trivial mitral valve regurgitation. No evidence of mitral stenosis.  9. Mild bileaflet mitral valve prolapse. 10. The tricuspid valve is normal in structure. 11. The aortic valve is normal in structure. Aortic valve regurgitation is not visualized. No evidence of aortic valve sclerosis or stenosis. 12. The pulmonic valve was normal in structure. Pulmonic valve regurgitation is trivial. 13. The inferior vena cava is normal in size with greater than 50% respiratory variability, suggesting right atrial pressure of 3 mmHg. 14. The interatrial septum was not well visualized. 15. No intracardiac source of emboli noted. FINDINGS  Left Ventricle: Left ventricular ejection fraction, by visual estimation, is 60%. The left ventricle has normal function. The left ventricle has no regional wall motion abnormalities. There is no left ventricular hypertrophy. Abnormal (paradoxical) septal motion, consistent with left bundle branch block. Left ventricular diastolic parameters are consistent with Grade II diastolic dysfunction (pseudonormalization). Elevated left ventricular end-diastolic pressure. 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 Right Ventricle: The right ventricular size is normal. No increase in right ventricular wall thickness. Global RV systolic function is has normal systolic function. Left Atrium: Left atrial size was normal in size. Right Atrium: Right atrial size was normal in size Pericardium: There is no evidence of pericardial effusion. Mitral Valve: The mitral valve is abnormal. There is mild prolapse of of the mitral valve. Trivial mitral valve regurgitation. No evidence of mitral valve stenosis by observation. Tricuspid Valve: The tricuspid valve is normal in structure. Tricuspid valve regurgitation is not demonstrated. Aortic Valve: The aortic valve is normal in structure. Aortic valve regurgitation is not  visualized. The aortic valve is structurally normal, with no evidence of sclerosis or stenosis. Pulmonic Valve: The pulmonic valve was normal in structure. Pulmonic valve regurgitation is trivial. Pulmonic regurgitation is trivial. Aorta: The aortic root, ascending aorta and aortic arch are all structurally normal, with no evidence of dilitation or obstruction. Venous: The inferior vena cava is normal in size with greater than 50% respiratory variability, suggesting right atrial pressure of 3 mmHg. IAS/Shunts: The interatrial septum was not well visualized.  LEFT VENTRICLE PLAX 2D LVIDd:         5.40 cm       Diastology LVIDs:         3.90 cm       LV e' lateral:   8.81 cm/s LV PW:         0.80 cm       LV E/e' lateral: 10.3 LV IVS:        0.80 cm       LV e' medial:    5.66 cm/s LVOT diam:     1.90 cm       LV E/e' medial:  16.0 LV SV:         75 ml LV SV Index:   32.58 LVOT Area:     2.84 cm  LV Volumes (MOD) LV area d, A2C:    29.30 cm LV area d, A4C:    29.30 cm LV area s, A2C:    20.60 cm LV area s, A4C:    18.90 cm LV major d, A2C:   7.80 cm LV major d, A4C:   8.13 cm LV major s, A2C:   7.31 cm LV major s, A4C:   6.60 cm LV vol d, MOD A2C: 92.2 ml LV vol d, MOD A4C: 85.6 ml LV vol s, MOD A2C: 51.9 ml LV vol s, MOD A4C: 44.4 ml LV SV MOD A2C:  40.3 ml LV SV MOD A4C:     85.6 ml LV SV MOD BP:      40.7 ml RIGHT VENTRICLE RV S prime:     10.00 cm/s TAPSE (M-mode): 1.6 cm LEFT ATRIUM             Index       RIGHT ATRIUM           Index LA diam:        3.50 cm 1.62 cm/m  RA Area:     10.50 cm LA Vol (A2C):   27.7 ml 12.84 ml/m RA Volume:   20.30 ml  9.41 ml/m LA Vol (A4C):   36.4 ml 16.88 ml/m LA Biplane Vol: 31.8 ml 14.74 ml/m  AORTIC VALVE LVOT Vmax:   96.00 cm/s LVOT Vmean:  71.400 cm/s LVOT VTI:    0.160 m  AORTA Ao Root diam: 2.60 cm MITRAL VALVE MV Area (PHT): 3.93 cm             SHUNTS MV PHT:        55.97 msec           Systemic VTI:  0.16 m MV Decel Time: 193 msec             Systemic Diam:  1.90 cm MR Peak grad: 143.0 mmHg MR Vmax:      598.00 cm/s MV E velocity: 90.60 cm/s 103 cm/s MV A velocity: 83.20 cm/s 70.3 cm/s MV E/A ratio:  1.09       1.5  Cherlynn Kaiser MD Electronically signed by Cherlynn Kaiser MD Signature Date/Time: 08/09/2019/4:19:06 PM    Final    CT HEAD CODE STROKE WO CONTRAST  Result Date: 08/09/2019 CLINICAL DATA:  Code stroke. Left-sided weakness with slurred speech EXAM: CT HEAD WITHOUT CONTRAST TECHNIQUE: Contiguous axial images were obtained from the base of the skull through the vertex without intravenous contrast. COMPARISON:  09/05/2018 FINDINGS: Brain: There is no mass, hemorrhage or extra-axial collection. There is generalized atrophy without lobar predilection. There is hypoattenuation of the periventricular white matter, most commonly indicating chronic ischemic microangiopathy. There is an old, large posterior right MCA territory infarct. Vascular: Hyperdense right MCA distal M1 and proximal M2 segments. Skull: The visualized skull base, calvarium and extracranial soft tissues are normal. Sinuses/Orbits: No fluid levels or advanced mucosal thickening of the visualized paranasal sinuses. No mastoid or middle ear effusion. The orbits are normal. ASPECTS [facility] Stroke Program Early CT Score) - Ganglionic level infarction (caudate, lentiform nuclei, internal capsule, insula, M1-M3 cortex): 7 - Supraganglionic infarction (M4-M6 cortex): 3 Total score (0-10 with 10 being normal): 10 IMPRESSION: 1. No acute hemorrhage. 2. Hyperdense right middle cerebral artery distal M1 and proximal M2 segments. 3. ASPECTS is 10. 4. These results were called by telephone at the time of interpretation on 08/09/2019 at 1:57 am to provider Roland Rack, who verbally acknowledged these results. Electronically Signed   By: Ulyses Jarred M.D.   On: 08/09/2019 01:58    PHYSICAL EXAM Elderly African-American male not in distress. . Afebrile. Head is nontraumatic. Neck is supple without  bruit.    Cardiac exam no murmur or gallop. Lungs are clear to auscultation. Distal pulses are well felt. Neurological Exam ; Patient is awake alert he has dysarthric speech but can be understood.  Follows simple midline and one-step commands.    He blinks to threat on the right but not on the left.  He has moderate left lower facial weakness.  He  has right gaze preference and can look to the left only through midline.  Tongue is midline.  He has left hemiparesis but withdraws left upper and lower extremity to painful stimuli only.  Is unable to lift left upper and lower extremity against gravity.  He has purposeful movements on the right side without weakness.  ASSESSMENT/PLAN Andres [name] is a 79 y.o. male with history of stroke, HTN, HLD, DB, CAD, obesity, carotid disease, seizures  found down by son at home confused with L sided weakness.   Stroke:   Acute R MCA infarct surrounding of R MCA parietal infarct, due to right M1 occlusion  Code Stroke CT head Hyperdense R MCA distal M1 and proximal M2. ASPECTS 10.     CTA head R M1 M2 short segment occlusion w/ generalized attentuation R MCA.   CTA neck ICA atherosclerosis w/o stenosis   CT perfusion area c/w old infarct, possible small acute anterior division   MRI  Large R MCA infarct surrounding old R MCA parietal infarct   2D Echo  normal  LDL 62  HgbA1c 5.6  SCDs for VTE prophylaxis  aspirin 81 mg daily and clopidogrel 75 mg daily prior to admission, now on aspirin 81 mg daily and clopidogrel 75 mg daily. Given inability to swallow, will add aspirin suppository for now.   Therapy recommendations:  pending   Disposition:  pending   Hx Seizures  EEG pending   On Keppra 500 bid  Admitted w/ seizures 09/2018  Followed by Dr. Rexene Alberts  Hypertension  Elevated but Stable . Permissive hypertension (OK if < 220/120) but gradually normalize in 5-7 days . Long-term BP goal normotensive  Hyperlipidemia  Home meds:   lipitor 40 and omega 3  Resume statin in hospital once po access  LDL 62, goal < 70  Continue statin at discharge  Diabetes type II Controlled  HgbA1c 5.6, goal < 7.0  CBGs Recent Labs    08/09/19 0223  GLUCAP 173*      SSI  Dysphagia . Secondary to stroke . Failed swallow . NPO . Speech on board   Other Stroke Risk Factors  Advanced age  Former Cigarette smoker, quit 29 yrs ago  Morbid Obesity, Body mass index is 40.8 kg/m., recommend weight loss, diet and exercise as appropriate   Hx stroke/TIA  02/2008 - R MCA infarct w/ petechial hemorrhage    Coronary artery disease s/p CABG  Other Active Problems  CKD Cre 1.35  Thrombocytopenia PLT 124  Hospital day # 1     He presented with left hemiplegia with worsening of her deficits secondary to right MCA infarct and extension of his old infarct and likely right MCA occlusion from an underlying chronic  intracranial stenosis.  Recommend dual antiplatelet therapy for 3 months.  Speech therapy to check swallow eval.  Physical occupational therapy consults.  Patient may not be able to swallow and may need feeding tube.  Family to decide on goals of care.  Discussed with Dr.Nettey.  Greater than 50% time during this 25-minute visit was spent on counseling and coordination of care and answering questions Antony Contras, MD Medical Director [facility] Pager: [phone] 08/10/2019 1:14 PM   To contact Stroke Continuity provider, please refer to [email or web address] After hours, contact General Neurology

## 2019-08-10 NOTE — ED Notes (Signed)
Restraints order clicked off in error. Pt restless in bed. Pt having to be redirected and repositioned  frequently

## 2019-08-10 NOTE — Progress Notes (Signed)
PROGRESS NOTE    Andres [name]  [id number] DOB: Dec 27, 1940 DOA: 08/09/2019 PCP: [name], MD   Brief Narrative: Andres [name] is a 79 y.o. male with a history of hypertension, hyperlipidemia, CAD status post CABG, carotid artery stenosis, CVA, mitral valve prolapse, diabetes mellitus type 2, BPH, hepatitis, GERD.  Patient presented secondary to being found down with associated left-sided weakness and dysarthria and found to have an acute right MCA stroke.  Patient with significant dysphagia secondary to stroke requiring alternate means of nutrition at this time.   Assessment & Plan:   Principal Problem:   CVA (cerebral vascular accident) [facility]) Active Problems:   Hyperlipidemia   Essential hypertension   BPH (benign prostatic hyperplasia)   CAD (coronary artery disease), hx of CABG 1997 X 6. Last nuc 2012 negative.   Morbid obesity ([place])   Transaminitis   History of seizures   Acute right MCA stroke Patient with left sided weakness and dysarthria. MRI significant for large area of acute infarct of right MCA territory. LDL of 100. Hemoglobin A1C of 6.2%. Transthoracic Echocardiogram significant for no evidence of embolic source. PT/OT recommendations pending. Speech therapy recommending n.p.o and tube feeds. -Cortrak NG tube -Neurology recommendations: Dual antiplatelet therapy for 3 months.  Will need to clarify regimen past 3 months. -PT/OT recommendations  Delirium Unknown etiology. Patient given ativan overnight with persistent lethargy. -Will try to manage disorientation with redirection and avoid sedating medications. If failing, will consider sitter  Dysphagia In setting of acute stroke.  Speech therapy recommending alternate means of nutrition and n.p.o.  As patient has failed her swallow study. -Core track ordered as mentioned above -Dietitian consult  History of seizures Patient is on Keppra 500 mg BID as an outpatient. EEG obtained with no evidence of  seizures.  Essential hypertension Mildly uncontrolled.  Patient is on HCTZ, losartan, metoprolol as an outpatient.  These were held on admission secondary to acute stroke. -Patient is currently n.p.o. but will likely restart metoprolol once patient has NG tube -Add back losartan and HCTZ as needed  Diabetes mellitus, type II Controlled.  Hemoglobin A1c of 6.2%.  Patient is not on outpatient regimen currently.  CAD Patient has a history of CABG in 1997.  Patient is on Lipitor, aspirin, Plavix, omega-3 fatty acids as an outpatient. -Continue aspirin, Plavix, Lipitor  CKD stage III Stable  Elevated AST Minimally elevated  BPH -Continue Flomax  Hyperlipidemia LDL 62.  Patient is on Lipitor and omega-3 fatty acid as an outpatient. -Continue Lipitor 40 mg daily  Morbid obesity Body mass index is 40.8 kg/m.   DVT prophylaxis: SCDs Code Status:   Code Status: Full Code Family Communication: Son via telephone, no answer Disposition Plan: Discharge per therapy recommendations   Consultants:   Neurology  Procedures:   08/09/2019: Transthoracic Echocardiogram IMPRESSIONS    1. Left ventricular ejection fraction, by visual estimation, is 60%. The left ventricle has normal function. There is no left ventricular hypertrophy.  2. Abnormal septal motion secondary to conduction delay.  3. Elevated left ventricular end-diastolic pressure.  4. Left ventricular diastolic parameters are consistent with Grade II diastolic dysfunction (pseudonormalization).  5. Global right ventricle has normal systolic function.The right ventricular size is normal. No increase in right ventricular wall thickness.  6. Left atrial size was normal.  7. Right atrial size was normal.  8. The mitral valve is abnormal. Trivial mitral valve regurgitation. No evidence of mitral stenosis.  9. Mild bileaflet mitral valve prolapse. 10. The  tricuspid valve is normal in structure. 11. The aortic valve is normal  in structure. Aortic valve regurgitation is not visualized. No evidence of aortic valve sclerosis or stenosis. 12. The pulmonic valve was normal in structure. Pulmonic valve regurgitation is trivial. 13. The inferior vena cava is normal in size with greater than 50% respiratory variability, suggesting right atrial pressure of 3 mmHg. 14. The interatrial septum was not well visualized. 15. No intracardiac source of emboli noted.   1/5: EEG DESCRIPTION: No clear posterior dominant rhythm was seen. EEG showed continuous generalized and lateralized right hemisphere 3-6hz  theta-delta slowing. Hyperventilation and photic stimulation were not performed.  ABNORMALITY - Continuous slow, generalized and lateralized right hemisphere  IMPRESSION: This study is suggestive of cortical dysfunction in right hemisphere consistent with underlying infarct. Additionally, there is evidence of moderate diffuse encephalopathy. No seizures or epileptiform discharges were seen throughout the recording.  Antimicrobials:  None    Subjective: Obtunded  Objective: Vitals:   08/10/19 0800 08/10/19 1200 08/10/19 1300 08/10/19 1400  BP: (!) 147/85 128/78 (!) 141/67 132/82  Pulse: 86     Resp: (!) 23 17 (!) 22 (!) 25  Temp:      TempSrc:      SpO2: (!) 88%     Weight:        Intake/Output Summary (Last 24 hours) at 08/10/2019 1609 Last data filed at 08/10/2019 1457 Gross per 24 hour  Intake 337.39 ml  Output 500 ml  Net -162.61 ml   Filed Weights   08/09/19 0100 08/09/19 0153  Weight: 111.2 kg 111.2 kg    Examination:  General exam: Appears calm and comfortable Respiratory system: Clear to auscultation. Respiratory effort normal. Cardiovascular system: S1 & S2 heard, RRR. No murmurs, rubs, gallops or clicks. Gastrointestinal system: Abdomen is nondistended, soft and nontender. No organomegaly or masses felt. Normal bowel sounds heard. Central nervous system: Lethargic, opens eyes to tactile  stimulus but does not follow commands and does not appear to be alert Extremities: No edema. No calf tenderness Skin: No cyanosis. No rashes Psychiatry: Judgement and insight appear imapaired.     Data Reviewed: I have personally reviewed following labs and imaging studies  CBC: Recent Labs  Lab 08/09/19 0254 08/09/19 0703 08/10/19 0316  WBC  --  8.8 7.0  NEUTROABS  --  6.3  --   HGB 13.3 13.8 12.5*  HCT 39.0 40.2 36.0*  MCV  --  100.5* 99.4  PLT  --  124* [id number]   Basic Metabolic Panel: Recent Labs  Lab 08/09/19 0254 08/09/19 0703  NA 137 141  K 5.7* 4.9  CL 103 103  CO2  --  27  GLUCOSE 127* 111*  BUN 30* 21  CREATININE 1.30* 1.35*  CALCIUM  --  9.8   GFR: Estimated Creatinine Clearance: 50.2 mL/min (A) (by C-G formula based on SCr of 1.35 mg/dL (H)). Liver Function Tests: Recent Labs  Lab 08/09/19 0703  AST 45*  ALT 26  ALKPHOS 70  BILITOT 0.4  PROT 6.8  ALBUMIN 4.1   No results for input(s): LIPASE, AMYLASE in the last 168 hours. No results for input(s): AMMONIA in the last 168 hours. Coagulation Profile: Recent Labs  Lab 08/09/19 0243 08/09/19 0703 08/09/19 0920  INR 1.1 1.1 1.0   Cardiac Enzymes: No results for input(s): CKTOTAL, CKMB, CKMBINDEX, TROPONINI in the last 168 hours. BNP (last 3 results) No results for input(s): PROBNP in the last 8760 hours. HbA1C: Recent Labs    08/10/19  0316  HGBA1C 6.2*   CBG: Recent Labs  Lab 08/09/19 0223  GLUCAP 173*   Lipid Profile: Recent Labs    08/10/19 0316  CHOL 152  HDL 36*  LDLCALC 100*  TRIG 80  CHOLHDL 4.2   Thyroid Function Tests: No results for input(s): TSH, T4TOTAL, FREET4, T3FREE, THYROIDAB in the last 72 hours. Anemia Panel: Recent Labs    08/10/19 0316  VITAMINB12 595   Sepsis Labs: No results for input(s): PROCALCITON, LATICACIDVEN in the last 168 hours.  Recent Results (from the past 240 hour(s))  SARS CORONAVIRUS 2 (TAT 6-24 HRS) Nasopharyngeal Nasopharyngeal  Swab     Status: None   Collection Time: 08/09/19 11:30 AM   Specimen: Nasopharyngeal Swab  Result Value Ref Range Status   SARS Coronavirus 2 NEGATIVE NEGATIVE Final    Comment: (NOTE) SARS-CoV-2 target nucleic acids are NOT DETECTED. The SARS-CoV-2 RNA is generally detectable in upper and lower respiratory specimens during the acute phase of infection. Negative results do not preclude SARS-CoV-2 infection, do not rule out co-infections with other pathogens, and should not be used as the sole basis for treatment or other patient management decisions. Negative results must be combined with clinical observations, patient history, and epidemiological information. The expected result is Negative. Fact Sheet for Patients: SugarRoll.be Fact Sheet for Healthcare Providers: https://www.woods-mathews.com/ This test is not yet approved or cleared by the [place] FDA and  has been authorized for detection and/or diagnosis of SARS-CoV-2 by FDA under an Emergency Use Authorization (EUA). This EUA will remain  in effect (meaning this test can be used) for the duration of the COVID-19 declaration under Section 56 4(b)(1) of the Act, 21 U.S.C. section 360bbb-3(b)(1), unless the authorization is terminated or revoked sooner. Performed at [facility], [place] [address] [place], [place] [address]          Radiology Studies: CT Code Stroke CTA Head W/WO contrast  Result Date: 08/09/2019 CLINICAL DATA:  Left-sided weakness with slurred speech EXAM: CT ANGIOGRAPHY HEAD AND NECK CT PERFUSION BRAIN TECHNIQUE: Multidetector CT imaging of the head and neck was performed using the standard protocol during bolus administration of intravenous contrast. Multiplanar CT image reconstructions and MIPs were obtained to evaluate the vascular anatomy. Carotid stenosis measurements (when applicable) are obtained utilizing NASCET criteria, using the distal internal  carotid diameter as the denominator. Multiphase CT imaging of the brain was performed following IV bolus contrast injection. Subsequent parametric perfusion maps were calculated using RAPID software. CONTRAST:  137mL OMNIPAQUE IOHEXOL 350 MG/ML SOLN COMPARISON:  None. FINDINGS: CTA NECK FINDINGS SKELETON: Multilevel degenerative disc disease and facet hypertrophy without bony spinal canal stenosis. OTHER NECK: Right posterior neck lipoma near the level of the occiput put. Normal thyroid. No pharyngeal or laryngeal mass. UPPER CHEST: No pneumothorax or pleural effusion. No nodules or masses. AORTIC ARCH: There is mild calcific atherosclerosis of the aortic arch. There is no aneurysm, dissection or hemodynamically significant stenosis of the visualized portion of the aorta. Conventional 3 vessel aortic branching pattern. The visualized proximal subclavian arteries are widely patent. RIGHT CAROTID SYSTEM: No dissection, occlusion or aneurysm. Mild atherosclerotic calcification at the carotid bifurcation without hemodynamically significant stenosis. LEFT CAROTID SYSTEM: No dissection, occlusion or aneurysm. Mild atherosclerotic calcification at the carotid bifurcation without hemodynamically significant stenosis. VERTEBRAL ARTERIES: Right dominant configuration. Both origins are clearly patent. There is no dissection, occlusion or flow-limiting stenosis to the skull base (V1-V3 segments). CTA HEAD FINDINGS POSTERIOR CIRCULATION: --Vertebral arteries: Normal V4 segments. --  Posterior inferior cerebellar arteries (PICA): Patent origins from the vertebral arteries. --Anterior inferior cerebellar arteries (AICA): Patent origins from the basilar artery. --Basilar artery: Normal. --Superior cerebellar arteries: Normal. --Posterior cerebral arteries: Normal. Both are predominantly supplied by the posterior communicating arteries (p-comm). ANTERIOR CIRCULATION: --Intracranial internal carotid arteries: Atherosclerotic  calcification of the internal carotid arteries at the skull base without hemodynamically significant stenosis. --Anterior cerebral arteries (ACA): Normal. Both A1 segments are present. Patent anterior communicating artery (a-comm). --Middle cerebral arteries (MCA): There is a short segment occlusion at the right M1-2 junction. There is generalized attenuated vascularity throughout the right MCA distribution, predominantly within the inferior distribution corresponding to the old infarct. The left MCA is normal. VENOUS SINUSES: As permitted by contrast timing, patent. ANATOMIC VARIANTS: None Review of the MIP images confirms the above findings. CT Brain Perfusion Findings: ASPECTS: 10 CBF (<30%) Volume: 22mL Perfusion (Tmax>6.0s) volume: 170mL Mismatch Volume: 52mL Infarction Location:Old infarct in the right MCA territory. The reported data is unreliable and predominantly corresponds to the area of old infarct brain. IMPRESSION: 1. Short segment occlusion of the right middle cerebral artery at the M1 M2 junction. Generalized attenuation of the vasculature throughout the right MCA territory is likely chronic in the setting of remote posterior MCA territory infarct. 2. Calculated regions of decreased perfusion are likely unreliable and predominantly correspond to the old infarct site. There may be a small component of anterior extension. 3. Carotid and aortic atherosclerosis without hemodynamically significant stenosis by NASCET criteria. Patent vertebral arteries. These results were called by telephone at the time of interpretation on 08/09/2019 at 2:30 am to provider [facility] , who verbally acknowledged these results. Electronically Signed   By: Ulyses Jarred M.D.   On: 08/09/2019 02:30   DG Chest 2 View  Result Date: 08/09/2019 CLINICAL DATA:  Stroke workup EXAM: CHEST - 2 VIEW COMPARISON:  09/05/2018 FINDINGS: Interstitial coarsening at the bases similar to prior. This is accentuated from prior in the  setting of low lung volumes. Prior CABG with stable heart size. No evidence of effusion or pneumothorax. IMPRESSION: Low volume chest with mild atelectatic type opacity at the bases. Electronically Signed   By: Monte Fantasia M.D.   On: 08/09/2019 08:45   CT Code Stroke CTA Neck W/WO contrast  Result Date: 08/09/2019 CLINICAL DATA:  Left-sided weakness with slurred speech EXAM: CT ANGIOGRAPHY HEAD AND NECK CT PERFUSION BRAIN TECHNIQUE: Multidetector CT imaging of the head and neck was performed using the standard protocol during bolus administration of intravenous contrast. Multiplanar CT image reconstructions and MIPs were obtained to evaluate the vascular anatomy. Carotid stenosis measurements (when applicable) are obtained utilizing NASCET criteria, using the distal internal carotid diameter as the denominator. Multiphase CT imaging of the brain was performed following IV bolus contrast injection. Subsequent parametric perfusion maps were calculated using RAPID software. CONTRAST:  146mL OMNIPAQUE IOHEXOL 350 MG/ML SOLN COMPARISON:  None. FINDINGS: CTA NECK FINDINGS SKELETON: Multilevel degenerative disc disease and facet hypertrophy without bony spinal canal stenosis. OTHER NECK: Right posterior neck lipoma near the level of the occiput put. Normal thyroid. No pharyngeal or laryngeal mass. UPPER CHEST: No pneumothorax or pleural effusion. No nodules or masses. AORTIC ARCH: There is mild calcific atherosclerosis of the aortic arch. There is no aneurysm, dissection or hemodynamically significant stenosis of the visualized portion of the aorta. Conventional 3 vessel aortic branching pattern. The visualized proximal subclavian arteries are widely patent. RIGHT CAROTID SYSTEM: No dissection, occlusion or aneurysm. Mild atherosclerotic calcification at the  carotid bifurcation without hemodynamically significant stenosis. LEFT CAROTID SYSTEM: No dissection, occlusion or aneurysm. Mild atherosclerotic calcification  at the carotid bifurcation without hemodynamically significant stenosis. VERTEBRAL ARTERIES: Right dominant configuration. Both origins are clearly patent. There is no dissection, occlusion or flow-limiting stenosis to the skull base (V1-V3 segments). CTA HEAD FINDINGS POSTERIOR CIRCULATION: --Vertebral arteries: Normal V4 segments. --Posterior inferior cerebellar arteries (PICA): Patent origins from the vertebral arteries. --Anterior inferior cerebellar arteries (AICA): Patent origins from the basilar artery. --Basilar artery: Normal. --Superior cerebellar arteries: Normal. --Posterior cerebral arteries: Normal. Both are predominantly supplied by the posterior communicating arteries (p-comm). ANTERIOR CIRCULATION: --Intracranial internal carotid arteries: Atherosclerotic calcification of the internal carotid arteries at the skull base without hemodynamically significant stenosis. --Anterior cerebral arteries (ACA): Normal. Both A1 segments are present. Patent anterior communicating artery (a-comm). --Middle cerebral arteries (MCA): There is a short segment occlusion at the right M1-2 junction. There is generalized attenuated vascularity throughout the right MCA distribution, predominantly within the inferior distribution corresponding to the old infarct. The left MCA is normal. VENOUS SINUSES: As permitted by contrast timing, patent. ANATOMIC VARIANTS: None Review of the MIP images confirms the above findings. CT Brain Perfusion Findings: ASPECTS: 10 CBF (<30%) Volume: 19mL Perfusion (Tmax>6.0s) volume: 185mL Mismatch Volume: 60mL Infarction Location:Old infarct in the right MCA territory. The reported data is unreliable and predominantly corresponds to the area of old infarct brain. IMPRESSION: 1. Short segment occlusion of the right middle cerebral artery at the M1 M2 junction. Generalized attenuation of the vasculature throughout the right MCA territory is likely chronic in the setting of remote posterior MCA  territory infarct. 2. Calculated regions of decreased perfusion are likely unreliable and predominantly correspond to the old infarct site. There may be a small component of anterior extension. 3. Carotid and aortic atherosclerosis without hemodynamically significant stenosis by NASCET criteria. Patent vertebral arteries. These results were called by telephone at the time of interpretation on 08/09/2019 at 2:30 am to provider [facility] , who verbally acknowledged these results. Electronically Signed   By: Ulyses Jarred M.D.   On: 08/09/2019 02:30   MR BRAIN WO CONTRAST  Result Date: 08/09/2019 CLINICAL DATA:  Stroke EXAM: MRI HEAD WITHOUT CONTRAST TECHNIQUE: Multiplanar, multiecho pulse sequences of the brain and surrounding structures were obtained without intravenous contrast. COMPARISON:  MRI head 09/06/2018. CT perfusion and CTA head 08/09/2019 FINDINGS: Brain: Chronic infarct right parietal lobe. Large area of acute infarct surrounding the chronic infarct. Acute infarct in the right posterior temporal lobe extending into the right frontal and parietal lobe. Acute infarct right insula. Sparing of the basal ganglia. Acute infarct is primarily cortical in location with minimal white matter component. Mild hemorrhage within the chronic infarct as noted previously. Ventricle size normal. No midline shift. Chronic microvascular ischemic changes throughout the white matter. Vascular: Normal arterial flow voids at the base of brain. Skull and upper cervical spine: Negative. Lipoma in the right posterior occipital scalp. Sinuses/Orbits: Mucosal edema paranasal sinuses with air-fluid level left maxillary sinus. Left cataract surgery. Other: Motion degraded study. IMPRESSION: Large area of acute infarct right MCA territory surrounding the chronic infarct in the right parietal lobe. Moderate chronic microvascular ischemic changes. Motion degraded study. Electronically Signed   By: Franchot Gallo M.D.   On:  08/09/2019 14:12   CT Code Stroke Cerebral Perfusion with contrast  Result Date: 08/09/2019 CLINICAL DATA:  Left-sided weakness with slurred speech EXAM: CT ANGIOGRAPHY HEAD AND NECK CT PERFUSION BRAIN TECHNIQUE: Multidetector CT imaging of the head  and neck was performed using the standard protocol during bolus administration of intravenous contrast. Multiplanar CT image reconstructions and MIPs were obtained to evaluate the vascular anatomy. Carotid stenosis measurements (when applicable) are obtained utilizing NASCET criteria, using the distal internal carotid diameter as the denominator. Multiphase CT imaging of the brain was performed following IV bolus contrast injection. Subsequent parametric perfusion maps were calculated using RAPID software. CONTRAST:  110mL OMNIPAQUE IOHEXOL 350 MG/ML SOLN COMPARISON:  None. FINDINGS: CTA NECK FINDINGS SKELETON: Multilevel degenerative disc disease and facet hypertrophy without bony spinal canal stenosis. OTHER NECK: Right posterior neck lipoma near the level of the occiput put. Normal thyroid. No pharyngeal or laryngeal mass. UPPER CHEST: No pneumothorax or pleural effusion. No nodules or masses. AORTIC ARCH: There is mild calcific atherosclerosis of the aortic arch. There is no aneurysm, dissection or hemodynamically significant stenosis of the visualized portion of the aorta. Conventional 3 vessel aortic branching pattern. The visualized proximal subclavian arteries are widely patent. RIGHT CAROTID SYSTEM: No dissection, occlusion or aneurysm. Mild atherosclerotic calcification at the carotid bifurcation without hemodynamically significant stenosis. LEFT CAROTID SYSTEM: No dissection, occlusion or aneurysm. Mild atherosclerotic calcification at the carotid bifurcation without hemodynamically significant stenosis. VERTEBRAL ARTERIES: Right dominant configuration. Both origins are clearly patent. There is no dissection, occlusion or flow-limiting stenosis to the skull  base (V1-V3 segments). CTA HEAD FINDINGS POSTERIOR CIRCULATION: --Vertebral arteries: Normal V4 segments. --Posterior inferior cerebellar arteries (PICA): Patent origins from the vertebral arteries. --Anterior inferior cerebellar arteries (AICA): Patent origins from the basilar artery. --Basilar artery: Normal. --Superior cerebellar arteries: Normal. --Posterior cerebral arteries: Normal. Both are predominantly supplied by the posterior communicating arteries (p-comm). ANTERIOR CIRCULATION: --Intracranial internal carotid arteries: Atherosclerotic calcification of the internal carotid arteries at the skull base without hemodynamically significant stenosis. --Anterior cerebral arteries (ACA): Normal. Both A1 segments are present. Patent anterior communicating artery (a-comm). --Middle cerebral arteries (MCA): There is a short segment occlusion at the right M1-2 junction. There is generalized attenuated vascularity throughout the right MCA distribution, predominantly within the inferior distribution corresponding to the old infarct. The left MCA is normal. VENOUS SINUSES: As permitted by contrast timing, patent. ANATOMIC VARIANTS: None Review of the MIP images confirms the above findings. CT Brain Perfusion Findings: ASPECTS: 10 CBF (<30%) Volume: 75mL Perfusion (Tmax>6.0s) volume: 118mL Mismatch Volume: 70mL Infarction Location:Old infarct in the right MCA territory. The reported data is unreliable and predominantly corresponds to the area of old infarct brain. IMPRESSION: 1. Short segment occlusion of the right middle cerebral artery at the M1 M2 junction. Generalized attenuation of the vasculature throughout the right MCA territory is likely chronic in the setting of remote posterior MCA territory infarct. 2. Calculated regions of decreased perfusion are likely unreliable and predominantly correspond to the old infarct site. There may be a small component of anterior extension. 3. Carotid and aortic atherosclerosis  without hemodynamically significant stenosis by NASCET criteria. Patent vertebral arteries. These results were called by telephone at the time of interpretation on 08/09/2019 at 2:30 am to provider [facility] , who verbally acknowledged these results. Electronically Signed   By: Ulyses Jarred M.D.   On: 08/09/2019 02:30   DG Abd Portable 1V  Result Date: 08/09/2019 CLINICAL DATA:  Incontinence EXAM: PORTABLE ABDOMEN - 1 VIEW COMPARISON:  July 26, 2013 FINDINGS: The exam is nearly nondiagnostic secondary to underpenetration. The visualized bowel gas pattern is nonobstructive and nonspecific. There is no definite acute osseous abnormality on this study. Vascular calcifications are noted. IMPRESSION: 1. Suboptimal  exam as detailed above. 2. No definite acute abnormality. Electronically Signed   By: Constance Holster M.D.   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 EEG to evaluate for seizure Level of alertness: awake AEDs during EEG study: Keppra Technical aspects: This EEG study was done with scalp electrodes positioned according to the 10-20 International system of electrode placement. Electrical activity was acquired at a sampling rate of 500Hz  and reviewed with a high frequency filter of 70Hz  and a low frequency filter of 1Hz . EEG data were recorded continuously and digitally stored. DESCRIPTION: No clear posterior dominant rhythm was seen. EEG showed continuous generalized and lateralized right hemisphere 3-6hz  theta-delta slowing. Hyperventilation and photic stimulation were not performed. ABNORMALITY - Continuous slow, generalized and lateralized right hemisphere IMPRESSION: This  study is suggestive of cortical dysfunction in right hemisphere consistent with underlying infarct. Additionally, there is evidence of moderate diffuse encephalopathy. No seizures or epileptiform discharges were seen throughout the recording. Lora Havens   ECHOCARDIOGRAM COMPLETE  Result Date: 08/09/2019   ECHOCARDIOGRAM REPORT   Patient Name:   Andres [name] Date of Exam: 08/09/2019 Medical Rec #:  [id number]       Height:       65.0 in Accession #:    [id number]:[id number]      Weight:       245.1 lb Date of Birth:  [birth year]       BSA:          2.16 m Patient Age:    89 years        BP:           185/95 mmHg Patient Gender: M               HR:           86 bpm. Exam Location:  Inpatient Procedure: 2D Echo, Cardiac Doppler and Color Doppler Indications:    Stroke 434.91  History:        Patient has prior history of Echocardiogram examinations, most                 recent 09/06/2018. CAD, Stroke, Mitral Valve Prolapse,                 Signs/Symptoms:Dyspnea and Syncope; Risk Factors:Hypertension,                 Diabetes, Dyslipidemia and Former Smoker. Elevated troponin.  Sonographer:    Paulita Fujita RDCS Referring Phys: [id number] [facility] A SMITH  Sonographer Comments: Suboptimal subcostal window. Image acquisition challenging due to respiratory motion. IMPRESSIONS  1. Left ventricular ejection fraction, by visual estimation, is 60%. The left ventricle has normal function. There is no left ventricular hypertrophy.  2. Abnormal septal motion secondary to conduction delay.  3. Elevated left ventricular end-diastolic pressure.  4. Left ventricular diastolic parameters are consistent with Grade II diastolic dysfunction (pseudonormalization).  5. Global right ventricle has normal systolic function.The right ventricular size is normal. No increase in right ventricular wall thickness.  6. Left atrial size was normal.  7. Right atrial size was normal.  8. The mitral valve is abnormal. Trivial mitral valve regurgitation. No  evidence of mitral stenosis.  9. Mild bileaflet mitral valve prolapse. 10. The tricuspid valve is normal in structure. 11. The aortic valve is normal in structure. Aortic valve regurgitation is not visualized. No evidence of aortic valve sclerosis or stenosis. 12. The pulmonic valve was normal in structure. Pulmonic valve regurgitation is trivial. 13. The inferior vena cava is normal in size with greater than 50% respiratory variability, suggesting right atrial pressure of 3 mmHg. 14. The interatrial septum was not well visualized. 15. No intracardiac source of emboli noted. FINDINGS  Left Ventricle: Left ventricular ejection fraction, by visual estimation, is 60%. The left ventricle has normal function. The left ventricle has no regional wall motion abnormalities. There is no left ventricular hypertrophy. Abnormal (paradoxical) septal motion, consistent with left bundle branch block. Left ventricular diastolic parameters are consistent with Grade II diastolic dysfunction (pseudonormalization). Elevated left ventricular end-diastolic pressure. 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 Right Ventricle: The right ventricular size is normal. No increase in right ventricular wall thickness. Global RV systolic function is has normal systolic function. Left Atrium: Left atrial size was normal in size. Right Atrium: Right atrial size was normal in size Pericardium: There is no evidence of pericardial effusion. Mitral Valve: The mitral valve is abnormal. There is mild prolapse of of the mitral valve. Trivial mitral valve regurgitation. No evidence of mitral valve stenosis by observation. Tricuspid Valve: The tricuspid valve is normal in structure. Tricuspid valve regurgitation is not demonstrated. Aortic Valve: The aortic valve is normal in structure. Aortic valve regurgitation is not visualized. The aortic valve is structurally normal, with no evidence of sclerosis or stenosis. Pulmonic Valve: The pulmonic valve was normal in  structure. Pulmonic valve regurgitation is trivial. Pulmonic regurgitation is trivial. Aorta: The aortic root, ascending aorta and aortic arch are all structurally normal, with no evidence of dilitation or obstruction. Venous: The inferior vena cava is normal in size with greater than 50% respiratory variability, suggesting right atrial pressure of 3 mmHg. IAS/Shunts: The interatrial septum was not well visualized.  LEFT VENTRICLE PLAX 2D LVIDd:         5.40 cm       Diastology LVIDs:         3.90 cm       LV e' lateral:   8.81 cm/s LV PW:         0.80 cm       LV E/e' lateral: 10.3 LV IVS:        0.80 cm       LV e' medial:    5.66 cm/s LVOT diam:     1.90 cm       LV E/e' medial:  16.0 LV SV:         75 ml LV SV Index:   32.58 LVOT Area:     2.84 cm  LV Volumes (MOD) LV area d, A2C:    29.30 cm LV area d, A4C:    29.30 cm LV area s, A2C:    20.60 cm LV area s, A4C:    18.90 cm LV major d, A2C:   7.80 cm LV major d, A4C:   8.13 cm LV major s, A2C:   7.31 cm LV major s, A4C:   6.60 cm LV vol d, MOD A2C: 92.2 ml LV vol d, MOD A4C: 85.6 ml LV vol s, MOD A2C: 51.9 ml LV vol s, MOD A4C: 44.4 ml LV SV MOD A2C:     40.3 ml LV SV MOD A4C:  85.6 ml LV SV MOD BP:      40.7 ml RIGHT VENTRICLE RV S prime:     10.00 cm/s TAPSE (M-mode): 1.6 cm LEFT ATRIUM             Index       RIGHT ATRIUM           Index LA diam:        3.50 cm 1.62 cm/m  RA Area:     10.50 cm LA Vol (A2C):   27.7 ml 12.84 ml/m RA Volume:   20.30 ml  9.41 ml/m LA Vol (A4C):   36.4 ml 16.88 ml/m LA Biplane Vol: 31.8 ml 14.74 ml/m  AORTIC VALVE LVOT Vmax:   96.00 cm/s LVOT Vmean:  71.400 cm/s LVOT VTI:    0.160 m  AORTA Ao Root diam: 2.60 cm MITRAL VALVE MV Area (PHT): 3.93 cm             SHUNTS MV PHT:        55.97 msec           Systemic VTI:  0.16 m MV Decel Time: 193 msec             Systemic Diam: 1.90 cm MR Peak grad: 143.0 mmHg MR Vmax:      598.00 cm/s MV E velocity: 90.60 cm/s 103 cm/s MV A velocity: 83.20 cm/s 70.3 cm/s MV E/A ratio:   1.09       1.5  Cherlynn Kaiser MD Electronically signed by Cherlynn Kaiser MD Signature Date/Time: 08/09/2019/4:19:06 PM    Final    CT HEAD CODE STROKE WO CONTRAST  Result Date: 08/09/2019 CLINICAL DATA:  Code stroke. Left-sided weakness with slurred speech EXAM: CT HEAD WITHOUT CONTRAST TECHNIQUE: Contiguous axial images were obtained from the base of the skull through the vertex without intravenous contrast. COMPARISON:  09/05/2018 FINDINGS: Brain: There is no mass, hemorrhage or extra-axial collection. There is generalized atrophy without lobar predilection. There is hypoattenuation of the periventricular white matter, most commonly indicating chronic ischemic microangiopathy. There is an old, large posterior right MCA territory infarct. Vascular: Hyperdense right MCA distal M1 and proximal M2 segments. Skull: The visualized skull base, calvarium and extracranial soft tissues are normal. Sinuses/Orbits: No fluid levels or advanced mucosal thickening of the visualized paranasal sinuses. No mastoid or middle ear effusion. The orbits are normal. ASPECTS [facility] Stroke Program Early CT Score) - Ganglionic level infarction (caudate, lentiform nuclei, internal capsule, insula, M1-M3 cortex): 7 - Supraganglionic infarction (M4-M6 cortex): 3 Total score (0-10 with 10 being normal): 10 IMPRESSION: 1. No acute hemorrhage. 2. Hyperdense right middle cerebral artery distal M1 and proximal M2 segments. 3. ASPECTS is 10. 4. These results were called by telephone at the time of interpretation on 08/09/2019 at 1:57 am to provider Roland Rack, who verbally acknowledged these results. Electronically Signed   By: Ulyses Jarred M.D.   On: 08/09/2019 01:58        Scheduled Meds:   stroke: mapping our early stages of recovery book   Does not apply Once   aspirin  300 mg Rectal Daily   Or   aspirin  81 mg Oral Daily   atorvastatin  40 mg Oral Daily   clopidogrel  75 mg Oral Daily   tamsulosin  0.4 mg Oral  Daily   Continuous Infusions:  sodium chloride 75 mL/hr at 08/09/19 2031   levETIRAcetam 500 mg (08/10/19 1441)     LOS: 1 day  Cordelia Poche, MD [facility] 08/10/2019, 4:09 PM  If 7PM-7AM, please contact night-coverage [email or web address]

## 2019-08-10 NOTE — Evaluation (Signed)
Clinical/Bedside Swallow Evaluation Patient Details  Name: Andres [name] MRN: [id number] Date of Birth: 01-18-1941  Today's Date: 08/10/2019 Time: SLP Start Time (ACUTE ONLY): [id number] SLP Stop Time (ACUTE ONLY): 1435 SLP Time Calculation (min) (ACUTE ONLY): 14 min  Past Medical History:  Past Medical History:  Diagnosis Date  . ALLERGIC RHINITIS   . Benign prostatic hypertrophy   . 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 Last nuc 2012 negative. 07/21/2013  . Carpal tunnel syndrome, bilateral   . Cataracts, bilateral   . Coronary artery disease    Dr. Claiborne Billings; 2D ECHO, 12/11/2011 - EF 50-55%, normal; NUCLEAR STRESS TEST, 10/16/2010 - no evidemce of inducible ischemia  . Diabetes mellitus type II   . Diverticulosis of colon   . GERD (gastroesophageal reflux disease)   . H/O hiatal hernia   . Head mass   . Hepatitis   . Hx of   sessile serrated colonic polyp 02/27/2015  . Hyperlipidemia   . Hypertension   . Inguinal hernia, Rt reduced, Lt present 07/21/2013  . Low back pain   . Mitral valve prolapse 11/24/2013   By echo dec 2014  . Nephrolithiasis   . Obesity, morbid ([place])   . Occlusion and stenosis of carotid artery without mention of cerebral infarction    CAROTID DOPPLER, 03/18/2012 - srable, mild, hard, plaque noted, bilaterally  . Stroke [facility])    Past Surgical History:  Past Surgical History:  Procedure Laterality Date  . APPENDECTOMY    . CARDIAC CATHETERIZATION    . CATARACT EXTRACTION Right   . CORONARY ARTERY BYPASS GRAFT    . INGUINAL HERNIA REPAIR Right 07/26/2013   Procedure: REPAIR  INCARCERATED RIGHT INGUINAL  HERNIA ;  Surgeon: Gwenyth Ober, MD;  Location: [place];  Service: General;  Laterality: Right;   HPI:  79 y.o. male with history of stroke, HTN, HLD, DB, CAD, obesity, carotid disease, seizures, found down by son at home confused with L sided weakness. MRI: acute right MCA infarct surrounding old R MCA parietal infarct.    Assessment / Plan /  Recommendation Clinical Impression  Pt poorly arousable.  Stated his name when asked; followed simple oral-motor commands intermittently.  Presents with left facial asymmetry, poor tongue extension.  Alerted to accept several ice chips, teaspoon water - demonstrates no oral awareness, no attempt at manipulating ice/water, no spontaneous swallows could be elicited.  Pt required constant cues to stay awake.  Presents with neurogenic dysphagia, compounded by poor mentation.  Not safe for POs. Consider cortrak.  SLP will follow for improvements in MS and readiness for swallow re-assessment.  SLP Visit Diagnosis: Dysphagia, oropharyngeal phase (R13.12)    Aspiration Risk  Severe aspiration risk    Diet Recommendation   npo       Other  Recommendations Oral Care Recommendations: Oral care QID   Follow up Recommendations Other (comment)(tba)      Frequency and Duration min 2x/week  2 weeks       Prognosis Prognosis for Safe Diet Advancement: Fair      Swallow Study   General Date of Onset: 08/09/19 HPI: 79 y.o. male with history of stroke, HTN, HLD, DB, CAD, obesity, carotid disease, seizures, found down by son at home confused with L sided weakness. MRI: acute right MCA infarct surrounding old R MCA parietal infarct.  Type of Study: Bedside Swallow Evaluation Previous Swallow Assessment: no Diet Prior to this Study: NPO Temperature Spikes Noted: No Respiratory Status: Room  air History of Recent Intubation: No Behavior/Cognition: Lethargic/Drowsy Oral Care Completed by SLP: Recent completion by staff Self-Feeding Abilities: Total assist Patient Positioning: Upright in bed Baseline Vocal Quality: Normal Volitional Cough: Cognitively unable to elicit Volitional Swallow: Unable to elicit    Oral/Motor/Sensory Function Overall Oral Motor/Sensory Function: Moderate impairment Facial Symmetry: Abnormal symmetry left;Suspected CN VII (facial) dysfunction Lingual ROM: Suspected CN XII  (hypoglossal) dysfunction;Reduced left Lingual Symmetry: Abnormal symmetry left;Suspected CN XII (hypoglossal) dysfunction   Ice Chips Ice chips: Impaired Presentation: Spoon Oral Phase Impairments: Poor awareness of bolus;Reduced lingual movement/coordination;Reduced labial seal Oral Phase Functional Implications: Left anterior spillage;Oral holding Pharyngeal Phase Impairments: Unable to trigger swallow   Thin Liquid Thin Liquid: Impaired Presentation: Spoon Oral Phase Impairments: Reduced lingual movement/coordination;Poor awareness of bolus Oral Phase Functional Implications: Left anterior spillage Pharyngeal  Phase Impairments: Unable to trigger swallow    Nectar Thick Nectar Thick Liquid: Not tested   Honey Thick Honey Thick Liquid: Not tested   Puree Puree: Not tested   Solid     Solid: Not tested      Andres [name] 08/10/2019,2:46 PM  Andres [name], [place] Office number [phone] Pager [phone]

## 2019-08-11 LAB — GLUCOSE, CAPILLARY
Glucose-Capillary: 81 mg/dL (ref 70–99)
Glucose-Capillary: 84 mg/dL (ref 70–99)
Glucose-Capillary: 85 mg/dL (ref 70–99)
Glucose-Capillary: 86 mg/dL (ref 70–99)
Glucose-Capillary: 86 mg/dL (ref 70–99)
Glucose-Capillary: 86 mg/dL (ref 70–99)

## 2019-08-11 LAB — HEPATITIS PANEL, ACUTE
HCV Ab: NONREACTIVE
Hep A IgM: UNDETERMINED — AB
Hep B C IgM: NONREACTIVE
Hepatitis B Surface Ag: NONREACTIVE

## 2019-08-11 LAB — FOLATE RBC
Folate, Hemolysate: 543 ng/mL
Folate, RBC: 1488 ng/mL (ref >498)
Hematocrit: 36.5 % — ABNORMAL LOW (ref 37.5–51.0)

## 2019-08-11 MED ORDER — JEVITY 1.2 CAL PO LIQD
1000.0000 mL | ORAL | Status: DC
  Filled 2019-08-11: qty 1000

## 2019-08-11 MED ORDER — PRO-STAT SUGAR FREE PO LIQD
30.0000 mL | Freq: Every day | ORAL | Status: DC
  Administered 2019-08-12 – 2019-08-19 (×7): 30 mL
  Filled 2019-08-11 (×6): qty 30

## 2019-08-11 MED ORDER — JEVITY 1.2 CAL PO LIQD
1000.0000 mL | ORAL | Status: DC
  Administered 2019-08-12 – 2019-08-14 (×3): 1000 mL
  Filled 2019-08-11 (×12): qty 1000

## 2019-08-11 NOTE — Progress Notes (Signed)
STROKE TEAM PROGRESS NOTE   INTERVAL HISTORY Patient lying in bed.drowsy but arousable and follows commands. Remains dysarhric with dysphagia and cortrack tube ordered for feeding. Spoke to son over phone and updated him  Vitals:   08/10/19 2021 08/11/19 0023 08/11/19 0352 08/11/19 0743  BP: (!) 144/86 (!) 151/73 (!) 149/79 (!) 141/64  Pulse: 81 88 89 87  Resp: 18 18 20 20   Temp: 98 F (36.7 C) 98.2 F (36.8 C) 98 F (36.7 C) 98.8 F (37.1 C)  TempSrc: Oral Oral Oral Oral  SpO2: 96% 98% 99% 99%  Weight:      Height:        CBC:  Recent Labs  Lab 08/09/19 0703 08/10/19 0316  WBC 8.8 7.0  NEUTROABS 6.3  --   HGB 13.8 12.5*  HCT 40.2 36.0*  MCV 100.5* 99.4  PLT 124* [id number]    Basic Metabolic Panel:  Recent Labs  Lab 08/09/19 0254 08/09/19 0703  NA 137 141  K 5.7* 4.9  CL 103 103  CO2  --  27  GLUCOSE 127* 111*  BUN 30* 21  CREATININE 1.30* 1.35*  CALCIUM  --  9.8   Lipid Panel:     Component Value Date/Time   CHOL 152 08/10/2019 0316   TRIG 80 08/10/2019 0316   HDL 36 (L) 08/10/2019 0316   CHOLHDL 4.2 08/10/2019 0316   VLDL 16 08/10/2019 0316   LDLCALC 100 (H) 08/10/2019 0316   HgbA1c:  Lab Results  Component Value Date   HGBA1C 6.2 (H) 08/10/2019   Urine Drug Screen:     Component Value Date/Time   LABOPIA NONE DETECTED 09/05/2018 1213   COCAINSCRNUR NONE DETECTED 09/05/2018 1213   LABBENZ NONE DETECTED 09/05/2018 1213   AMPHETMU NONE DETECTED 09/05/2018 1213   THCU NONE DETECTED 09/05/2018 1213   LABBARB NONE DETECTED 09/05/2018 1213    Alcohol Level     Component Value Date/Time   ETH <10 09/05/2018 1055    IMAGING past 48 hours MR BRAIN WO CONTRAST  Result Date: 08/09/2019 CLINICAL DATA:  Stroke EXAM: MRI HEAD WITHOUT CONTRAST TECHNIQUE: Multiplanar, multiecho pulse sequences of the brain and surrounding structures were obtained without intravenous contrast. COMPARISON:  MRI head 09/06/2018. CT perfusion and CTA head 08/09/2019 FINDINGS:  Brain: Chronic infarct right parietal lobe. Large area of acute infarct surrounding the chronic infarct. Acute infarct in the right posterior temporal lobe extending into the right frontal and parietal lobe. Acute infarct right insula. Sparing of the basal ganglia. Acute infarct is primarily cortical in location with minimal white matter component. Mild hemorrhage within the chronic infarct as noted previously. Ventricle size normal. No midline shift. Chronic microvascular ischemic changes throughout the white matter. Vascular: Normal arterial flow voids at the base of brain. Skull and upper cervical spine: Negative. Lipoma in the right posterior occipital scalp. Sinuses/Orbits: Mucosal edema paranasal sinuses with air-fluid level left maxillary sinus. Left cataract surgery. Other: Motion degraded study. IMPRESSION: Large area of acute infarct right MCA territory surrounding the chronic infarct in the right parietal lobe. Moderate chronic microvascular ischemic changes. Motion degraded study. Electronically Signed   By: Franchot Gallo M.D.   On: 08/09/2019 14:12   DG Abd Portable 1V  Result Date: 08/09/2019 CLINICAL DATA:  Incontinence EXAM: PORTABLE ABDOMEN - 1 VIEW COMPARISON:  July 26, 2013 FINDINGS: The exam is nearly nondiagnostic secondary to underpenetration. The visualized bowel gas pattern is nonobstructive and nonspecific. There is no definite acute osseous abnormality on this study.  Vascular calcifications are noted. IMPRESSION: 1. Suboptimal exam as detailed above. 2. No definite acute abnormality. Electronically Signed   By: Constance Holster M.D.   On: 08/09/2019 16:11   EEG adult  Result Date: 08/09/2019 Lora Havens, MD     08/09/2019  7:27 PM Patient Name: Andres [name] MRN: [id number] Epilepsy Attending: Lora Havens Referring Physician/Provider: Dr Antony Contras Date: 08/08/2018 Duration: 22.13 mins Patient history: 79yo M with prior stroke and seizures who presented with left  sided weakness. EEG to evaluate for seizure Level of alertness: awake AEDs during EEG study: Keppra Technical aspects: This EEG study was done with scalp electrodes positioned according to the 10-20 International system of electrode placement. Electrical activity was acquired at a sampling rate of 500Hz  and reviewed with a high frequency filter of 70Hz  and a low frequency filter of 1Hz . EEG data were recorded continuously and digitally stored. DESCRIPTION: No clear posterior dominant rhythm was seen. EEG showed continuous generalized and lateralized right hemisphere 3-6hz  theta-delta slowing. Hyperventilation and photic stimulation were not performed. ABNORMALITY - Continuous slow, generalized and lateralized right hemisphere IMPRESSION: This study is suggestive of cortical dysfunction in right hemisphere consistent with underlying infarct. Additionally, there is evidence of moderate diffuse encephalopathy. No seizures or epileptiform discharges were seen throughout the recording. [place]    Elderly African-American male not in distress. . Afebrile. Head is nontraumatic. Neck is supple without bruit.    Cardiac exam no murmur or gallop. Lungs are clear to auscultation. Distal pulses are well felt. Neurological Exam ; Patient is awake alert he has dysarthric speech but can be understood.  Follows simple midline and one-step commands.    He blinks to threat on the right but not on the left.  He has moderate left lower facial weakness.  He has right gaze preference and can look to the left only through midline.  Tongue is midline.  He has left hemiparesis but withdraws left upper and lower extremity to painful stimuli only.  Is unable to lift left upper and lower extremity against gravity.  He has purposeful movements on the right side without weakness.  ASSESSMENT/PLAN Andres [name] is a 79 y.o. male with history of stroke, HTN, HLD, DB, CAD, obesity, carotid disease, seizures   found down by son at home confused with L sided weakness.   Stroke:   Acute R MCA infarct surrounding of R MCA parietal infarct, due to right M1 occlusion  Code Stroke CT head Hyperdense R MCA distal M1 and proximal M2. ASPECTS 10.     CTA head R M1 M2 short segment occlusion w/ generalized attentuation R MCA.   CTA neck ICA atherosclerosis w/o stenosis   CT perfusion area c/w old infarct, possible small acute anterior division   MRI  Large R MCA infarct surrounding old R MCA parietal infarct   2D Echo  normal  LDL 62  HgbA1c 5.6  SCDs for VTE prophylaxis  aspirin 81 mg daily and clopidogrel 75 mg daily prior to admission, now on aspirin 81 mg daily and clopidogrel 75 mg daily. Given inability to swallow, on aspirin suppository   Therapy recommendations:  pending   Disposition:  pending   Hx Seizures  EEG pending   On Keppra 500 bid  Admitted w/ seizures 09/2018  Followed by Dr. Rexene Alberts  Hypertension  Elevated but Stable . Permissive hypertension (OK if < 220/120) but gradually normalize in 5-7 days . Long-term BP  goal normotensive  Hyperlipidemia  Home meds:  lipitor 40 and omega 3  Resume statin in hospital once po access  LDL 62, goal < 70  Continue statin at discharge  Diabetes type II Controlled  HgbA1c 5.6, goal < 7.0  CBGs Recent Labs    08/11/19 0005 08/11/19 0351 08/11/19 0744  GLUCAP 85 86 81      SSI  Dysphagia . Secondary to stroke . Failed swallow . NPO . 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  Speech therapy to check swallow eval.  Physical occupational therapy consults. Agree with cortrack tube   Son wants aggressive care and rehab   Discussed with Dr.Nettey.  Greater than 50% time during this 25-minute visit was spent on counseling and coordination of care and answering questions Antony Contras, [place] Pager: [phone] 08/11/2019 11:29 AM   To contact Stroke Continuity provider, please refer to [email or web address] After hours, contact General Neurology

## 2019-08-11 NOTE — Evaluation (Signed)
Physical Therapy Evaluation Patient Details Name: Andres [name] MRN: [id number] DOB: 10/16/1940 Today's Date: 08/11/2019   History of Present Illness  79 yo male from home found laying on floor with incomprehensiable speech, L side weakness and not follow commands. Pt noted to be incontinent of urine.  R gaze preference and L side hemiparesis  CT (+) Hyperdense right middle cerebral artery distal M1 and proximal M2segments.PMH CAD with CABG 1997 DM HTn diverticulosis hiatal hernia head mass hepatitis lowe backpain mitral valve prolapse obese CVA hx smoking 29 years  Clinical Impression  Pt admitted with above. Prior to admission, pt living with his son and using a cane for ambulation per chart review. On PT evaluation, pt presents with decreased cognition, balance impairments, decreased functional use of LUE. Requiring two person moderate assist for standing up, pulling up with right upper extremity on back of recliner. Further mobility deferred due to pt fatigue and attempts to lie back down. Recommending CIR to maximize functional independence and decrease caregiver burden.    Follow Up Recommendations CIR;Supervision/Assistance - 24 hour    Equipment Recommendations  Other (comment)(TBA)    Recommendations for Other Services Rehab consult     Precautions / Restrictions Precautions Precautions: Fall Restrictions Weight Bearing Restrictions: No      Mobility  Bed Mobility Overal bed mobility: Needs Assistance Bed Mobility: Rolling;Sidelying to Sit;Sit to Supine Rolling: Max assist Sidelying to sit: Max assist   Sit to supine: Min assist   General bed mobility comments: Max A to attend to left side and reach with RUE to bed rail. Max A to push into sitting and lower BLEs over EOB. Min A for return to supine as pt eager to return to bed  Transfers Overall transfer level: Needs assistance Equipment used: (use of recliner backwards to pull up on) Transfers: Sit to/from  Stand Sit to Stand: Mod assist;+2 physical assistance         General transfer comment: Mod A +2 to power up into standing using recliner back to pull up on. Good initiation   Ambulation/Gait                Stairs            Wheelchair Mobility    Modified Rankin (Stroke Patients Only) Modified Rankin (Stroke Patients Only) Pre-Morbid Rankin Score: No symptoms Modified Rankin: Severe disability     Balance Overall balance assessment: Needs assistance Sitting-balance support: No upper extremity supported;Feet supported Sitting balance-Leahy Scale: Poor     Standing balance support: Single extremity supported;During functional activity Standing balance-Leahy Scale: Poor                               Pertinent Vitals/Pain Pain Assessment: Faces Faces Pain Scale: No hurt Pain Intervention(s): Monitored during session    Home Living Family/patient expects to be discharged to:: Private residence Living Arrangements: Children Available Help at Discharge: Family;Friend(s);Available PRN/intermittently Type of Home: House         Home Equipment: Kasandra Knudsen - single point Additional Comments: Per chart review, lives with his son.    Prior Function Level of Independence: Independent         Comments: walking in neighborhood to get to church     Hand Dominance   Dominant Hand: Right    Extremity/Trunk Assessment   Upper Extremity Assessment Upper Extremity Assessment: LUE deficits/detail LUE Deficits / Details: Noting trace movement with gross motor movements  during AROM. No active grasp. Also presenting with neglect of LUE. LUE Coordination: decreased gross motor;decreased fine motor    Lower Extremity Assessment Lower Extremity Assessment: Difficult to assess due to impaired cognition(moving BLE's spontaneously)    Cervical / Trunk Assessment Cervical / Trunk Assessment: Normal  Communication   Communication: No difficulties   Cognition Arousal/Alertness: Awake/alert;Lethargic(Pt closing his eyes at times during conversation) Behavior During Therapy: Flat affect;Restless Overall Cognitive Status: Impaired/Different from baseline Area of Impairment: Orientation;Attention;Memory;Following commands;Safety/judgement;Awareness;Problem solving                 Orientation Level: Disoriented to;Place;Time;Situation(Pt reporting he is at a [employer], [employer]) Current Attention Level: Focused Memory: Decreased short-term memory Following Commands: Follows one step commands inconsistently;Follows one step commands with increased time Safety/Judgement: Decreased awareness of safety;Decreased awareness of deficits Awareness: Intellectual Problem Solving: Slow processing;Difficulty sequencing;Requires verbal cues General Comments: Pt with poor awareness and attention. Very motivated to lay back in bed and presenting with difficulty following cues for other tasks. Pt able to report he has a son and a daughter who both live in [place] as well as his age "1" (but not DOB).      General Comments General comments (skin integrity, edema, etc.): HR 90    Exercises     Assessment/Plan    PT Assessment Patient needs continued PT services  PT Problem List Decreased strength;Decreased range of motion;Decreased activity tolerance;Decreased balance;Decreased coordination;Decreased mobility;Decreased cognition;Decreased safety awareness       PT Treatment Interventions DME instruction;Gait training;Functional mobility training;Therapeutic activities;Balance training;Therapeutic exercise;Patient/family education    PT Goals (Current goals can be found in the Care Plan section)  Acute Rehab PT Goals Patient Stated Goal: Unstated PT Goal Formulation: Patient unable to participate in goal setting Time For Goal Achievement: 08/25/19 Potential to Achieve Goals: Fair    Frequency Min 4X/week   Barriers to discharge         Co-evaluation PT/OT/SLP Co-Evaluation/Treatment: Yes Reason for Co-Treatment: Complexity of the patient's impairments (multi-system involvement);Necessary to address cognition/behavior during functional activity;For patient/therapist safety;To address functional/ADL transfers PT goals addressed during session: Mobility/safety with mobility OT goals addressed during session: ADL's and self-care       AM-PAC PT "6 Clicks" Mobility  Outcome Measure Help needed turning from your back to your side while in a flat bed without using bedrails?: Total Help needed moving from lying on your back to sitting on the side of a flat bed without using bedrails?: Total Help needed moving to and from a bed to a chair (including a wheelchair)?: Total Help needed standing up from a chair using your arms (e.g., wheelchair or bedside chair)?: A Lot Help needed to walk in hospital room?: Total Help needed climbing 3-5 steps with a railing? : Total 6 Click Score: 7    End of Session Equipment Utilized During Treatment: Gait belt Activity Tolerance: Patient limited by fatigue Patient left: in bed;with call bell/phone within reach;with bed alarm set Nurse Communication: Mobility status PT Visit Diagnosis: Other abnormalities of gait and mobility (R26.89);Other symptoms and signs involving the nervous system (R29.898)    Time: [id number] PT Time Calculation (min) (ACUTE ONLY): 16 min   Charges:   PT Evaluation $PT Eval Moderate Complexity: 1 Mod          Ellamae Sia, [place], DPT Acute Rehabilitation Services Pager [phone] Office [phone]   Willy Eddy 08/11/2019, 3:30 PM

## 2019-08-11 NOTE — Evaluation (Signed)
Occupational Therapy Evaluation Patient Details Name: Andres [name] MRN: [id number] DOB: Feb 04, 1941 Today's Date: 08/11/2019    History of Present Illness 79 yo male from home found laying on floor with incomprehensiable speech, L side weakness and not follow commands. Pt noted to be incontinent of urine.  R gaze preference and L side hemiparesis  CT (+) Hyperdense right middle cerebral artery distal M1 and proximal M2segments.PMH CAD with CABG 1997 DM HTn diverticulosis hiatal hernia head mass hepatitis lowe backpain mitral valve prolapse obese CVA hx smoking 29 years   Clinical Impression   PTA, pt was living with his son and performed ADLs per chart review. Pt currently requiring Max A for bathing, dressing, and toileting. Pt requiring Mod A +2 for sit<>stand with use of recliner (backwards) to pull into standing with RUE. Pt presenting with poor balance, strength, cognition, functional use of LUE, and vision. Pt will require further acute OT to facilitate safe dc. Recommend dc to CIR for intensive OT to optimize safety, independence with ADLs, and return to PLOF.      Follow Up Recommendations  CIR;Supervision/Assistance - 24 hour    Equipment Recommendations  Other (comment)(Defer to next venue )    Recommendations for Other Services PT consult     Precautions / Restrictions Precautions Precautions: Fall      Mobility Bed Mobility Overal bed mobility: Needs Assistance Bed Mobility: Rolling;Sidelying to Sit;Sit to Supine Rolling: Max assist Sidelying to sit: Max assist   Sit to supine: Min assist   General bed mobility comments: Max A to attend to left side and reach with RUE to bed rail. Max A to push into sitting and lower BLEs over EOB. Min A for return to supine as pt eager to return to bed  Transfers Overall transfer level: Needs assistance Equipment used: (use of recliner backwards to pull up on) Transfers: Sit to/from Stand Sit to Stand: Mod assist;+2 physical  assistance         General transfer comment: Mod A +2 to power up into standing using recliner back to pull up on.     Balance Overall balance assessment: Needs assistance Sitting-balance support: No upper extremity supported;Feet supported Sitting balance-Leahy Scale: Poor     Standing balance support: Single extremity supported;During functional activity Standing balance-Leahy Scale: Poor                             ADL either performed or assessed with clinical judgement   ADL Overall ADL's : Needs assistance/impaired Eating/Feeding: NPO   Grooming: Moderate assistance   Upper Body Bathing: Maximal assistance;Sitting;Bed level   Lower Body Bathing: Maximal assistance;+2 for physical assistance;Sit to/from stand;Bed level   Upper Body Dressing : Maximal assistance;Sitting;Bed level   Lower Body Dressing: Maximal assistance;Sit to/from stand;Bed level   Toilet Transfer: Maximal assistance;+2 for physical assistance(simulated at EOB)           Functional mobility during ADLs: Moderate assistance;+2 for physical assistance(sit<>stand only) General ADL Comments: Pt presenting with decreased balance, strength, cognition, and functional use of LUE. Pt highly motivated to return to bed once sitting at EOB. Pt also having pulled off his condom cath and with urine in bed. Pt requiring Mod A +2 for sit<>stand with use of recliner (backwards) to pull inot standing.     Vision Baseline Vision/History: Wears glasses Wears Glasses: At all times Patient Visual Report: Other (comment)(unable to verablize changes) Vision Assessment?: Yes;Vision impaired- to be  further tested in functional context Alignment/Gaze Preference: Gaze right Additional Comments: Pt with gaze preference to the right. Difficulty tracking to left. Will require further vision assessment as pt closing his eye and falling sleep     Perception     Praxis      Pertinent Vitals/Pain Pain  Assessment: Faces Faces Pain Scale: No hurt Pain Intervention(s): Monitored during session     Hand Dominance     Extremity/Trunk Assessment Upper Extremity Assessment Upper Extremity Assessment: LUE deficits/detail LUE Deficits / Details: Noting trace movement with gross motor movements during AROM. No active grasp. Also presenting with neglect of LUE. LUE Coordination: decreased gross motor;decreased fine motor   Lower Extremity Assessment Lower Extremity Assessment: Defer to PT evaluation   Cervical / Trunk Assessment Cervical / Trunk Assessment: Normal   Communication     Cognition Arousal/Alertness: Awake/alert;Lethargic(Pt closing his eyes at times during conversation) Behavior During Therapy: Flat affect;Restless Overall Cognitive Status: Impaired/Different from baseline Area of Impairment: Orientation;Attention;Memory;Following commands;Safety/judgement;Awareness;Problem solving                 Orientation Level: Disoriented to;Place;Time;Situation(Pt reporting he is at a [employer], [employer]) Current Attention Level: Focused Memory: Decreased short-term memory Following Commands: Follows one step commands inconsistently;Follows one step commands with increased time Safety/Judgement: Decreased awareness of safety;Decreased awareness of deficits Awareness: Intellectual Problem Solving: Slow processing;Difficulty sequencing;Requires verbal cues General Comments: Pt with poor awareness and attention. Very motivated to lay back in bed and presenting with difficulty following cues for other tasks. Pt able to report he has a son and a daughter who both live in [place] as well as his age "41" (but not DOB).   General Comments  HR 90    Exercises     Shoulder Instructions      Home Living Family/patient expects to be discharged to:: Private residence Living Arrangements: Children                               Additional Comments: Per chart review, lives  with his son.      Prior Functioning/Environment                   OT Problem List: Decreased strength;Decreased range of motion;Decreased activity tolerance;Impaired balance (sitting and/or standing);Impaired vision/perception;Decreased coordination;Decreased cognition;Decreased safety awareness;Decreased knowledge of use of DME or AE;Decreased knowledge of precautions;Impaired UE functional use      OT Treatment/Interventions: Self-care/ADL training;Therapeutic exercise;Energy conservation;DME and/or AE instruction;Therapeutic activities;Patient/family education    OT Goals(Current goals can be found in the care plan section) Acute Rehab OT Goals Patient Stated Goal: Unstated OT Goal Formulation: Patient unable to participate in goal setting Time For Goal Achievement: 08/25/19 Potential to Achieve Goals: Good  OT Frequency: Min 2X/week   Barriers to D/C:            Co-evaluation PT/OT/SLP Co-Evaluation/Treatment: Yes Reason for Co-Treatment: Complexity of the patient's impairments (multi-system involvement);For patient/therapist safety;To address functional/ADL transfers   OT goals addressed during session: ADL's and self-care      AM-PAC OT "6 Clicks" Daily Activity     Outcome Measure Help from another person eating meals?: Total Help from another person taking care of personal grooming?: A Lot Help from another person toileting, which includes using toliet, bedpan, or urinal?: A Lot Help from another person bathing (including washing, rinsing, drying)?: A Lot Help from another person to put on and taking off regular upper body clothing?:  A Lot Help from another person to put on and taking off regular lower body clothing?: A Lot 6 Click Score: 11   End of Session Equipment Utilized During Treatment: Gait belt Nurse Communication: Mobility status(Urine incontience in bed)  Activity Tolerance: Patient limited by lethargy(Restlessness) Patient left: in bed;with  call bell/phone within reach;with bed alarm set  OT Visit Diagnosis: Unsteadiness on feet (R26.81);Other abnormalities of gait and mobility (R26.89);Muscle weakness (generalized) (M62.81);Other symptoms and signs involving cognitive function                Time: [id number] OT Time Calculation (min): 15 min Charges:  OT General Charges $OT Visit: 1 Visit OT Evaluation $OT Eval Moderate Complexity: [place], OTR/L Acute Rehab Pager: [phone] Office: [place] 08/11/2019, 2:48 PM

## 2019-08-11 NOTE — Progress Notes (Signed)
  Speech Language Pathology Treatment: Dysphagia  Patient Details Name: Andres [name] MRN: [id number] DOB: [birth year] Today's Date: 08/11/2019 Time: 1040-1055 SLP Time Calculation (min) (ACUTE ONLY): 15 min  Assessment / Plan / Recommendation Clinical Impression  Skilled treatment session focused on dysphagia goals. Pt was initially awake alert to his name and was able to answer brief question on how he was feeling. However despite maximal verbal and tactile cues, pt was mot able to sustain arousal for safe consumption of pt intake. In response to maximal cues, pt able to grunt and open eyes partially but immediately closed them. SLP attempted trials of cold tsp of water to lips for functional sensation/simulation. Tsp amount passively entered lips with visible oral holding noted as pt was inattention to bolus. With physical and verbal cues, pt produced palpable swallow with mild cough. At this time, pt is not able to attend to boluses for safe intake. Education provided to pt's nurse and MD (Dr Teryl Lucy) that pt should remain NPO until mentation improves. ST to follow for dysphagia and for completed of SLE when pt is better able to attend to tasks.    HPI HPI: 79 y.o. male with history of stroke, HTN, HLD, DB, CAD, obesity, carotid disease, seizures, found down by son at home confused with L sided weakness. MRI: acute right MCA infarct surrounding old R MCA parietal infarct.       SLP Plan  Continue with current plan of care       Recommendations  Diet recommendations: NPO Medication Administration: Via alternative means                Oral Care Recommendations: Oral care QID Follow up Recommendations: (TBD) SLP Visit Diagnosis: Dysphagia, oropharyngeal phase (R13.12) Plan: Continue with current plan of care       GO                Andres [name] 08/11/2019, 12:24 PM

## 2019-08-11 NOTE — Progress Notes (Signed)
PROGRESS NOTE    Andres [name]  [id number] DOB: 03-Jan-1941 DOA: 08/09/2019 PCP: [name], MD   Brief Narrative: Andres [name] is a 79 y.o. male with a history of hypertension, hyperlipidemia, CAD status post CABG, carotid artery stenosis, CVA, mitral valve prolapse, diabetes mellitus type 2, BPH, hepatitis, GERD.  Patient presented secondary to being found down with associated left-sided weakness and dysarthria and found to have an acute right MCA stroke.  Patient with significant dysphagia secondary to stroke requiring alternate means of nutrition at this time.   Assessment & Plan:   Principal Problem:   CVA (cerebral vascular accident) [facility]) Active Problems:   Hyperlipidemia   Essential hypertension   BPH (benign prostatic hyperplasia)   CAD (coronary artery disease), hx of CABG 1997 X 6. Last nuc 2012 negative.   Morbid obesity ([place])   Transaminitis   History of seizures   Acute right MCA stroke Patient with left sided weakness and dysarthria. MRI significant for large area of acute infarct of right MCA territory. LDL of 100. Hemoglobin A1C of 6.2%. Transthoracic Echocardiogram significant for no evidence of embolic source. PT/OT recommendations pending.significant dysphagia and left hemiplegia. Speech therapy recommending n.p.o and tube feeds. Discussed need for NG tube with patient and he understands. -Cortrak NG tube ordered; dietitian for tube feeds -Neurology recommendations: Dual antiplatelet therapy for 3 months.  Will need to clarify regimen past 3 months. -PT/OT recommendations  Delirium Unknown etiology. Patient given ativan with adverse effect. Delirium improved. Mental status improved as a whole.  Dysphagia In setting of acute stroke.  Speech therapy recommending alternate means of nutrition and n.p.o.  As patient has failed her swallow study. -Core track ordered as mentioned above -Dietitian consulted  History of seizures Patient is on Keppra 500 mg  BID as an outpatient. EEG obtained with no evidence of seizures.  Essential hypertension Mildly uncontrolled.  Patient is on HCTZ, losartan, metoprolol as an outpatient.  These were held on admission secondary to acute stroke. -Patient is currently n.p.o. but will likely restart metoprolol once patient has NG tube -Add back losartan and HCTZ as needed  Diabetes mellitus, type II Controlled.  Hemoglobin A1c of 6.2%.  Patient is not on outpatient regimen currently.  CAD Patient has a history of CABG in 1997.  Patient is on Lipitor, aspirin, Plavix, omega-3 fatty acids as an outpatient. -Continue aspirin, Plavix, Lipitor  CKD stage III  Stable  Elevated AST Minimally elevated  BPH -Continue Flomax  Hyperlipidemia LDL 62.  Patient is on Lipitor and omega-3 fatty acid as an outpatient. -Continue Lipitor 40 mg daily  Morbid obesity Body mass index is 35.98 kg/m.   DVT prophylaxis: SCDs Code Status:   Code Status: Full Code Family Communication: Son via telephone Disposition Plan: Discharge per therapy recommendations   Consultants:   Neurology  Procedures:   08/09/2019: Transthoracic Echocardiogram IMPRESSIONS    1. Left ventricular ejection fraction, by visual estimation, is 60%. The left ventricle has normal function. There is no left ventricular hypertrophy.  2. Abnormal septal motion secondary to conduction delay.  3. Elevated left ventricular end-diastolic pressure.  4. Left ventricular diastolic parameters are consistent with Grade II diastolic dysfunction (pseudonormalization).  5. Global right ventricle has normal systolic function.The right ventricular size is normal. No increase in right ventricular wall thickness.  6. Left atrial size was normal.  7. Right atrial size was normal.  8. The mitral valve is abnormal. Trivial mitral valve regurgitation. No evidence of mitral stenosis.  9. Mild bileaflet mitral valve prolapse. 10. The tricuspid valve is normal  in structure. 11. The aortic valve is normal in structure. Aortic valve regurgitation is not visualized. No evidence of aortic valve sclerosis or stenosis. 12. The pulmonic valve was normal in structure. Pulmonic valve regurgitation is trivial. 13. The inferior vena cava is normal in size with greater than 50% respiratory variability, suggesting right atrial pressure of 3 mmHg. 14. The interatrial septum was not well visualized. 15. No intracardiac source of emboli noted.   1/5: EEG DESCRIPTION: No clear posterior dominant rhythm was seen. EEG showed continuous generalized and lateralized right hemisphere 3-6hz  theta-delta slowing. Hyperventilation and photic stimulation were not performed.  ABNORMALITY - Continuous slow, generalized and lateralized right hemisphere  IMPRESSION: This study is suggestive of cortical dysfunction in right hemisphere consistent with underlying infarct. Additionally, there is evidence of moderate diffuse encephalopathy. No seizures or epileptiform discharges were seen throughout the recording.  Antimicrobials:  None    Subjective: No issues  Objective: Vitals:   08/10/19 2006 08/10/19 2021 08/11/19 0023 08/11/19 0352  BP:  (!) 144/86 (!) 151/73 (!) 149/79  Pulse:  81 88 89  Resp:  18 18 20   Temp:  98 F (36.7 C) 98.2 F (36.8 C) 98 F (36.7 C)  TempSrc:  Oral Oral Oral  SpO2:  96% 98% 99%  Weight: 104.2 kg     Height: 5\' 7"  (1.702 m)       Intake/Output Summary (Last 24 hours) at 08/11/2019 1111 Last data filed at 08/11/2019 1040 Gross per 24 hour  Intake 2400 ml  Output 1100 ml  Net 1300 ml   Filed Weights   08/09/19 0100 08/09/19 0153 08/10/19 2006  Weight: 111.2 kg 111.2 kg 104.2 kg    Examination:  General exam: Appears calm and comfortable Respiratory system: Clear to auscultation. Respiratory effort normal. Cardiovascular system: S1 & S2 heard, RRR. No murmurs, rubs, gallops or clicks. Gastrointestinal system: Abdomen is  nondistended, soft and nontender. No organomegaly or masses felt. Normal bowel sounds heard. Central nervous system: Alert and oriented to person and place. 1/5 strength on left upper extremity with 3/5 strength on left lower extremity. Significant dysarthria Extremities: No edema. No calf tenderness Skin: No cyanosis. No rashes Psychiatry: Judgement and insight appear normal. Mood & affect appropriate.     Data Reviewed: I have personally reviewed following labs and imaging studies  CBC: Recent Labs  Lab 08/09/19 0254 08/09/19 0703 08/10/19 0316  WBC  --  8.8 7.0  NEUTROABS  --  6.3  --   HGB 13.3 13.8 12.5*  HCT 39.0 40.2 36.0*  MCV  --  100.5* 99.4  PLT  --  124* [id number]   Basic Metabolic Panel: Recent Labs  Lab 08/09/19 0254 08/09/19 0703  NA 137 141  K 5.7* 4.9  CL 103 103  CO2  --  27  GLUCOSE 127* 111*  BUN 30* 21  CREATININE 1.30* 1.35*  CALCIUM  --  9.8   GFR: Estimated Creatinine Clearance: 50.2 mL/min (A) (by C-G formula based on SCr of 1.35 mg/dL (H)). Liver Function Tests: Recent Labs  Lab 08/09/19 0703  AST 45*  ALT 26  ALKPHOS 70  BILITOT 0.4  PROT 6.8  ALBUMIN 4.1   No results for input(s): LIPASE, AMYLASE in the last 168 hours. No results for input(s): AMMONIA in the last 168 hours. Coagulation Profile: Recent Labs  Lab 08/09/19 0243 08/09/19 0703 08/09/19 0920  INR 1.1 1.1 1.0  Cardiac Enzymes: No results for input(s): CKTOTAL, CKMB, CKMBINDEX, TROPONINI in the last 168 hours. BNP (last 3 results) No results for input(s): PROBNP in the last 8760 hours. HbA1C: Recent Labs    08/10/19 0316  HGBA1C 6.2*   CBG: Recent Labs  Lab 08/09/19 0223 08/10/19 2010 08/11/19 0005 08/11/19 0351 08/11/19 0744  GLUCAP 173* 109* 85 86 81   Lipid Profile: Recent Labs    08/10/19 0316  CHOL 152  HDL 36*  LDLCALC 100*  TRIG 80  CHOLHDL 4.2   Thyroid Function Tests: No results for input(s): TSH, T4TOTAL, FREET4, T3FREE, THYROIDAB in  the last 72 hours. Anemia Panel: Recent Labs    08/10/19 0316  VITAMINB12 595   Sepsis Labs: No results for input(s): PROCALCITON, LATICACIDVEN in the last 168 hours.  Recent Results (from the past 240 hour(s))  SARS CORONAVIRUS 2 (TAT 6-24 HRS) Nasopharyngeal Nasopharyngeal Swab     Status: None   Collection Time: 08/09/19 11:30 AM   Specimen: Nasopharyngeal Swab  Result Value Ref Range Status   SARS Coronavirus 2 NEGATIVE NEGATIVE Final    Comment: (NOTE) SARS-CoV-2 target nucleic acids are NOT DETECTED. The SARS-CoV-2 RNA is generally detectable in upper and lower respiratory specimens during the acute phase of infection. Negative results do not preclude SARS-CoV-2 infection, do not rule out co-infections with other pathogens, and should not be used as the sole basis for treatment or other patient management decisions. Negative results must be combined with clinical observations, patient history, and epidemiological information. The expected result is Negative. Fact Sheet for Patients: SugarRoll.be Fact Sheet for Healthcare Providers: https://www.woods-mathews.com/ This test is not yet approved or cleared by the [place] FDA and  has been authorized for detection and/or diagnosis of SARS-CoV-2 by FDA under an Emergency Use Authorization (EUA). This EUA will remain  in effect (meaning this test can be used) for the duration of the COVID-19 declaration under Section 56 4(b)(1) of the Act, 21 U.S.C. section 360bbb-3(b)(1), unless the authorization is terminated or revoked sooner. Performed at [facility], [place] [address] [place], [place] [address]          Radiology Studies: MR BRAIN WO CONTRAST  Result Date: 08/09/2019 CLINICAL DATA:  Stroke EXAM: MRI HEAD WITHOUT CONTRAST TECHNIQUE: Multiplanar, multiecho pulse sequences of the brain and surrounding structures were obtained without intravenous contrast. COMPARISON:   MRI head 09/06/2018. CT perfusion and CTA head 08/09/2019 FINDINGS: Brain: Chronic infarct right parietal lobe. Large area of acute infarct surrounding the chronic infarct. Acute infarct in the right posterior temporal lobe extending into the right frontal and parietal lobe. Acute infarct right insula. Sparing of the basal ganglia. Acute infarct is primarily cortical in location with minimal white matter component. Mild hemorrhage within the chronic infarct as noted previously. Ventricle size normal. No midline shift. Chronic microvascular ischemic changes throughout the white matter. Vascular: Normal arterial flow voids at the base of brain. Skull and upper cervical spine: Negative. Lipoma in the right posterior occipital scalp. Sinuses/Orbits: Mucosal edema paranasal sinuses with air-fluid level left maxillary sinus. Left cataract surgery. Other: Motion degraded study. IMPRESSION: Large area of acute infarct right MCA territory surrounding the chronic infarct in the right parietal lobe. Moderate chronic microvascular ischemic changes. Motion degraded study. Electronically Signed   By: Franchot Gallo M.D.   On: 08/09/2019 14:12   DG Abd Portable 1V  Result Date: 08/09/2019 CLINICAL DATA:  Incontinence EXAM: PORTABLE ABDOMEN - 1 VIEW COMPARISON:  July 26, 2013 FINDINGS:  The exam is nearly nondiagnostic secondary to underpenetration. The visualized bowel gas pattern is nonobstructive and nonspecific. There is no definite acute osseous abnormality on this study. Vascular calcifications are noted. IMPRESSION: 1. Suboptimal exam as detailed above. 2. No definite acute abnormality. Electronically Signed   By: Constance Holster M.D.   On: 08/09/2019 16:11   EEG adult  Result Date: 08/09/2019 Lora Havens, MD     08/09/2019  7:27 PM Patient Name: SING HISE MRN: [id number] Epilepsy Attending: Lora Havens Referring Physician/Provider: Dr Antony Contras Date: 08/08/2018 Duration: 22.13 mins Patient  history: 79yo M with prior stroke and seizures who presented with left sided weakness. EEG to evaluate for seizure Level of alertness: awake AEDs during EEG study: Keppra Technical aspects: This EEG study was done with scalp electrodes positioned according to the 10-20 International system of electrode placement. Electrical activity was acquired at a sampling rate of 500Hz  and reviewed with a high frequency filter of 70Hz  and a low frequency filter of 1Hz . EEG data were recorded continuously and digitally stored. DESCRIPTION: No clear posterior dominant rhythm was seen. EEG showed continuous generalized and lateralized right hemisphere 3-6hz  theta-delta slowing. Hyperventilation and photic stimulation were not performed. ABNORMALITY - Continuous slow, generalized and lateralized right hemisphere IMPRESSION: This study is suggestive of cortical dysfunction in right hemisphere consistent with underlying infarct. Additionally, there is evidence of moderate diffuse encephalopathy. No seizures or epileptiform discharges were seen throughout the recording. Priyanka Barbra Sarks        Scheduled Meds: .  stroke: mapping our early stages of recovery book   Does not apply Once  . aspirin  300 mg Rectal Daily   Or  . aspirin  81 mg Oral Daily  . atorvastatin  40 mg Oral Daily  . clopidogrel  75 mg Oral Daily  . tamsulosin  0.4 mg Oral Daily   Continuous Infusions: . sodium chloride 75 mL/hr at 08/10/19 2155  . levETIRAcetam 500 mg (08/11/19 0916)     LOS: 2 days     Cordelia Poche, MD [facility] 08/11/2019, 11:11 AM  If 7PM-7AM, please contact night-coverage [email or web address]

## 2019-08-11 NOTE — Progress Notes (Signed)
Rehab Admissions Coordinator Note:  Patient was screened by Michel Santee for appropriateness for an Inpatient Acute Rehab Consult.  At this time, we are recommending Inpatient Rehab consult.  I will place an order.    Michel Santee 08/11/2019, 4:12 PM  I can be reached at [phone].

## 2019-08-11 NOTE — Progress Notes (Signed)
Initial Nutrition Assessment  DOCUMENTATION CODES:   Obesity unspecified  INTERVENTION:  Cortrak service available tomorrow.   Once cortrak NGT placed and ready for use, Initiate Jevity 1.2 formula @ 25 ml/hr and increase by 10 ml every 4 hours to goal rate of 65 ml/hr.   30 ml Prostat once daily.    Tube feeding regimen provides 1972 kcal (100% of needs), 102 grams of protein, and 1264 ml of H2O.   NUTRITION DIAGNOSIS:   Inadequate oral intake related to inability to eat as evidenced by NPO status.  GOAL:   Patient will meet greater than or equal to 90% of their needs  MONITOR:   TF tolerance, Weight trends, Labs, Skin, I & O's  REASON FOR ASSESSMENT:   Consult Enteral/tube feeding initiation and management  ASSESSMENT:   79 y.o. male with history of stroke, HTN, HLD, DB, CAD, obesity, carotid disease, seizures  found down by son at home confused with L sided weakness. Pt with Acute R MCA infarct surrounding of R MCA parietal infarct, due to right M1 occlusion.  Pt NPO with severe aspiration risk due to poor mentation. Unable to obtain nutrition history at this time. Plans for enteral nutrition. Cortrak NGT feeding tube has been ordered. Discussed with RN regarding Cortrak service available tomorrow. RN verbalized understanding and reports feeding tube may be placed tomorrow. RN to discuss with MD regarding feeding tube placement plans. If feeding tube placement today is warranted, RN may place a NGT at bedside. RD to order tube feeding with plans for initiation once feeding tube placed and ready for use.   Unable to complete Nutrition-Focused physical exam at this time. Pt requesting to rest at time of visit.   Labs and medications reviewed.   Diet Order:   Diet Order            Diet NPO time specified  Diet effective now              EDUCATION NEEDS:   Not appropriate for education at this time  Skin:  Skin Assessment: Reviewed RN Assessment  Last BM:   Unknown  Height:   Ht Readings from Last 1 Encounters:  08/10/19 5\' 7"  (1.702 m)    Weight:   Wt Readings from Last 1 Encounters:  08/10/19 104.2 kg    Ideal Body Weight:  67.27 kg  BMI:  Body mass index is 35.98 kg/m.  Estimated Nutritional Needs:   Kcal:  1850-2050  Protein:  90-105 grams  Fluid:  >/= 1.8 L/day    Corrin Parker, MS, RD, LDN Pager # [phone] After hours/ weekend pager # [phone]

## 2019-08-12 DIAGNOSIS — I639 Cerebral infarction, unspecified: Secondary | ICD-10-CM

## 2019-08-12 LAB — BASIC METABOLIC PANEL
Anion gap: 16 — ABNORMAL HIGH (ref 5–15)
BUN: 12 mg/dL (ref 8–23)
CO2: 20 mmol/L — ABNORMAL LOW (ref 22–32)
Calcium: 9.1 mg/dL (ref 8.9–10.3)
Chloride: 102 mmol/L (ref 98–111)
Creatinine, Ser: 1.24 mg/dL (ref 0.61–1.24)
GFR calc Af Amer: >60 mL/min (ref >60)
GFR calc non Af Amer: 55 mL/min — ABNORMAL LOW (ref >60)
Glucose, Bld: 89 mg/dL (ref 70–99)
Potassium: 4.3 mmol/L (ref 3.5–5.1)
Sodium: 138 mmol/L (ref 135–145)

## 2019-08-12 LAB — GLUCOSE, CAPILLARY
Glucose-Capillary: 93 mg/dL (ref 70–99)
Glucose-Capillary: 99 mg/dL (ref 70–99)
Glucose-Capillary: 108 mg/dL — ABNORMAL HIGH (ref 70–99)
Glucose-Capillary: 82 mg/dL (ref 70–99)
Glucose-Capillary: 80 mg/dL (ref 70–99)

## 2019-08-12 MED ORDER — METOPROLOL TARTRATE 50 MG PO TABS
50.0000 mg | ORAL_TABLET | Freq: Two times a day (BID) | ORAL | Status: DC
  Administered 2019-08-12 – 2019-08-15 (×7): 50 mg
  Filled 2019-08-12 (×7): qty 1

## 2019-08-12 NOTE — Consult Note (Signed)
Physical Medicine and Rehabilitation Consult Reason for Consult: Left side weakness and slurred speech Referring Physician: [facility]   HPI: DEMARI [name] is a 79 y.o. 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  Per chart review patient lives with his son.  Reportedly independent prior to admission using a single-point cane.  Presented 08/08/2018 after being found down with left-sided weakness and slurred speech.  Cranial CT scan showed hyperdense right middle cerebral artery distal M1 and proximal M2 segments.  No acute hemorrhage.  Patient did not receive TPA.  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  Carotid and aortic atherosclerosis without stenosis.  MRI showed large area of acute infarction right MCA territory surrounding chronic infarct in the right parietal lobe.  Echocardiogram with ejection fraction of 60% without emboli.  EEG negative for seizure.  Admission chemistries with creatinine 1.35, SARS coronavirus negative, hemoglobin 13.8.  Neurology follow-up currently on aspirin and Plavix for CVA prophylaxis.  Patient is n.p.o. with alternative means of nutritional support.  Patient remains on [place] as prior to admission for history of seizure.  Therapy evaluation completed with recommendations of physical medicine rehab consult.   Review of Systems  Constitutional: Negative for chills and fever.  HENT: Negative for hearing loss.   Eyes: Negative for blurred vision and double vision.  Respiratory: Negative for cough and shortness of breath.   Cardiovascular: Negative for chest pain, palpitations and leg swelling.  Gastrointestinal: Positive for constipation. Negative  for heartburn, nausea and vomiting.  Genitourinary: Positive for urgency. Negative for dysuria, flank pain and hematuria.  Musculoskeletal: Positive for back pain and myalgias.  Skin: Negative for rash.  Neurological: Positive for speech change, seizures and weakness.  All other systems reviewed and are negative.  Past Medical History:  Diagnosis Date  . ALLERGIC RHINITIS   . Benign prostatic hypertrophy   . 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 Last nuc 2012 negative. 07/21/2013  . Carpal tunnel syndrome, bilateral   . Cataracts, bilateral   . Coronary artery disease    Dr. Claiborne [name]; 2D ECHO, 12/11/2011 - EF 50-55%, normal; NUCLEAR STRESS TEST, 10/16/2010 - no evidemce of inducible ischemia  . Diabetes mellitus type II   . Diverticulosis of colon   . GERD (gastroesophageal reflux disease)   . H/O hiatal hernia   . Head mass   . Hepatitis   . Hx of   sessile serrated colonic polyp 02/27/2015  . Hyperlipidemia   . Hypertension   . Inguinal hernia, Rt reduced, Lt present 07/21/2013  . Low back pain   . Mitral valve prolapse 11/24/2013   By echo dec 2014  . Nephrolithiasis   . Obesity, morbid (Andres [name])   . Occlusion and stenosis of carotid artery without mention of cerebral infarction    CAROTID DOPPLER, 03/18/2012 - srable, mild, hard, plaque noted, bilaterally  . Stroke [facility])    Past Surgical History:  Procedure Laterality Date  . APPENDECTOMY    . CARDIAC CATHETERIZATION    . CATARACT EXTRACTION Right   . CORONARY ARTERY BYPASS GRAFT    . INGUINAL HERNIA REPAIR Right 07/26/2013   Procedure: REPAIR  INCARCERATED RIGHT INGUINAL  HERNIA ;  Surgeon: Jeneen Rinks  Andres Boston, MD;  Location: [facility];  Service: General;  Laterality: Right;   Family History  Problem Relation Age of Onset  . Heart attack Mother   . Diabetes Mother   . Heart attack Brother   . Heart attack Maternal Grandmother   . Hypertension Maternal Grandmother   . Pancreatic cancer Brother   . Kidney disease Brother    . Hypertension Son   . Kidney disease Son        ?  Andres [name] Alzheimer's disease Father   . Diabetes Brother   . Diabetes Sister    Social History:  reports that he quit smoking about 29 years ago. His smoking use included cigarettes. He has never used smokeless tobacco. He reports that he does not drink alcohol or use drugs. Allergies: No Known Allergies Medications Prior to Admission  Medication Sig Dispense Refill  . Acetaminophen 500 MG coapsule Take 500 mg by mouth every 6 (six) hours as needed for mild pain. Reported on 07/21/2015    . Ascorbic Acid (VITAMIN C) 1000 MG tablet Take 1,000 mg by mouth daily.    . Aspirin (ADULT ASPIRIN LOW STRENGTH) 81 MG EC tablet Take 81 mg by mouth daily.      Andres [name] atorvastatin (LIPITOR) 40 MG tablet Take 1 tablet (40 mg total) by mouth daily. (Patient taking differently: Take 40 mg by mouth daily at 6 PM. ) 90 tablet 3  . B Complex Vitamins (VITAMIN B-COMPLEX PO) Take 1 tablet by mouth daily.    . clopidogrel (PLAVIX) 75 MG tablet Take 1 tablet (75 mg total) by mouth daily. 90 tablet 3  . hydrochlorothiazide (MICROZIDE) 12.5 MG capsule Take 1 capsule (12.5 mg total) by mouth daily. 90 capsule 3  . levETIRAcetam (KEPPRA) 500 MG tablet Take 1 tablet (500 mg total) by mouth 2 (two) times daily. 180 tablet 3  . losartan (COZAAR) 50 MG tablet Take 1 tablet (50 mg total) by mouth daily. 90 tablet 3  . meloxicam (MOBIC) 7.5 MG tablet Take 1 tablet (7.5 mg total) by mouth daily. (Patient taking differently: Take 7.5 mg by mouth daily as needed for pain. ) 30 tablet 0  . metoprolol tartrate (LOPRESSOR) 50 MG tablet Take 1 tablet (50 mg total) by mouth 2 (two) times daily. 180 tablet 3  . Multiple Vitamin (MULTIVITAMIN) capsule Take 1 capsule by mouth daily.     . Omega-3 Fatty Acids (OMEGA 3 PO) Take 360 mg by mouth 2 (two) times daily.    . tamsulosin (FLOMAX) 0.4 MG CAPS capsule Take 1 capsule (0.4 mg total) by mouth daily. 90 capsule 3  . VITAMIN D PO Take 1  tablet by mouth daily.    . diclofenac sodium (VOLTAREN) 1 % GEL Apply 2 g topically every 6 (six) hours as needed (for arthritis). (Patient not taking: Reported on [id number]) [id number] g 3  . folic acid (FOLVITE) 1 MG tablet Take 1 tablet (1 mg total) by mouth daily. (Patient not taking: Reported on 08/10/2019) 90 tablet 3    Home: [place] expects to be discharged to:: Private residence Living Arrangements: Children Available Help at Discharge: Family, Friend(s), Available PRN/intermittently Type of Home: [place] - single point Additional Comments: Per chart review, lives with his son.  Functional History: Prior Function Level of Independence: Independent Comments: walking in neighborhood to get to church Functional Status:  Mobility: Bed Mobility Overal bed mobility: Needs Assistance Bed Mobility: Rolling, Sidelying to Sit, Sit to Supine Rolling:  Max assist Sidelying to sit: Max assist Sit to supine: Min assist General bed mobility comments: Max A to attend to left side and reach with RUE to bed rail. Max A to push into sitting and lower BLEs over EOB. Min A for return to supine as pt eager to return to bed Transfers Overall transfer level: Needs assistance Equipment used: (use of recliner backwards to pull up on) Transfers: Sit to/from Stand Sit to Stand: Mod assist, +2 physical assistance General transfer comment: Mod A +2 to power up into standing using recliner back to pull up on. Good initiation       ADL: ADL Overall ADL's : Needs assistance/impaired Eating/Feeding: NPO Grooming: Moderate assistance Upper Body Bathing: Maximal assistance, Sitting, Bed level Lower Body Bathing: Maximal assistance, +2 for physical assistance, Sit to/from stand, Bed level Upper Body Dressing : Maximal assistance, Sitting, Bed level Lower Body Dressing: Maximal assistance, Sit to/from stand, Bed level Toilet Transfer: Maximal assistance, +2 for physical  assistance(simulated at EOB) Functional mobility during ADLs: Moderate assistance, +2 for physical assistance(sit<>stand only) General ADL Comments: Pt presenting with decreased balance, strength, cognition, and functional use of LUE. Pt highly motivated to return to bed once sitting at EOB. Pt also having pulled off his condom cath and with urine in bed. Pt requiring Mod A +2 for sit<>stand with use of recliner (backwards) to pull inot standing.  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 Very motivated to lay back in bed and presenting with difficulty following cues for other tasks. Pt able to report he has a son and a daughter who both live in [place] as well as his age "16" (but not DOB).  Blood pressure (!) 171/93, pulse (!) 103, temperature 98.1 F (36.7 C), temperature source Oral, resp. rate 20, height 5\' 7"  (1.702 m), weight 104.2 kg, SpO2 100 %.  Physical Exam   General: Lethargic, opens eyes to verbal stimuli but  quickly falls back asleep HEENT: Head is normocephalic, atraumatic, PERRLA, EOMI, sclera anicteric, oral mucosa pink and moist, dentition intact, ext ear canals clear,  Neck: Supple without JVD or lymphadenopathy Heart: Reg rate and rhythm. No murmurs rubs or gallops Chest: CTA bilaterally without wheezes, rales, or rhonchi; no distress GI: Soft, non-tender, non-distended, bowel sounds positive. Has Cortrak feeding tube Extremities: No clubbing, cyanosis, or edema. Pulses are 2+ Skin: Clean and intact without signs of breakdown Neurological/Musckuloskeletal: Lethargic, opens eyes to verbal stimuli but quickly falls back asleep. R sided gaze preference. Moving right arm and leg but not left.  Psych: Lethargic    Results for orders placed or performed during the hospital encounter of 08/09/19 (from the past 24 hour(s))  Glucose, capillary     Status: None   Collection Time: 08/11/19  7:44 AM  Result Value Ref Range   Glucose-Capillary 81 70 - 99  mg/dL  Glucose, capillary     Status: None   Collection Time: 08/11/19 12:38 PM  Result Value Ref Range   Glucose-Capillary 86 70 - 99 mg/dL   Comment 1 Notify RN    Comment 2 Document in Chart   Glucose, capillary     Status: None   Collection Time: 08/11/19  4:07 PM  Result Value Ref Range   Glucose-Capillary 86 70 - 99 mg/dL   Comment 1 Notify RN    Comment 2 Document in Chart   Glucose, capillary     Status: None   Collection Time: 08/11/19  7:49 PM  Result Value Ref Range   Glucose-Capillary 84 70 - 99 mg/dL  Glucose, capillary     Status: None   Collection Time: 08/12/19  4:50 AM  Result Value Ref Range   Glucose-Capillary 93 70 - 99 mg/dL   Comment 1 Notify RN    Comment 2 Document in Chart    No results found.   Assessment/Plan: Diagnosis: Acute R MCA infarct 1. Does the need for close, 24 hr/day medical supervision in concert with the patient's rehab needs make it unreasonable for this patient to be served in a less  intensive setting? Yes 2. Co-Morbidities requiring supervision/potential complications: seizures, HTN, HLD, DM2, dysphagia, CAD s/p CABG, CKD, thrombocytopenia 3. Due to bladder management, bowel management, safety, skin/wound care, disease management, medication administration, pain management and patient education, does the patient require 24 hr/day rehab nursing? Yes 4. Does the patient require coordinated care of a physician, rehab nurse, therapy disciplines of PT, OT, SLP to address physical and functional deficits in the context of the above medical diagnosis(es)? Yes Addressing deficits in the following areas: balance, endurance, locomotion, strength, transferring, bowel/bladder control, bathing, dressing, feeding, grooming, toileting, cognition, speech, language, swallowing and psychosocial support 5. Can the patient actively participate in an intensive therapy program of at least 3 hrs of therapy per day at least 5 days per week? Yes 6. The potential for patient to make measurable gains while on inpatient rehab is excellent 7. Anticipated functional outcomes upon discharge from inpatient rehab are S with PT, S with OT, S with SLP. 8. Estimated rehab length of stay to reach the above functional goals is: 2-3 weeks 9. Anticipated discharge destination: Home 10. Overall Rehab/Functional Prognosis: excellent  RECOMMENDATIONS: This patient's condition is appropriate for continued rehabilitative care in the following setting: CIR Patient has agreed to participate in recommended program. N/A Note that insurance prior authorization may be required for reimbursement for recommended care.  Comment: Mr. [name] has severe deficits due to his R MCA stroke and would benefit from CIR therapies. He lives with his son and uses a cane at baseline, able to ambulate in the community. Exam limited by lethargy this morning but performed   Cathlyn Parsons, PA-C 08/12/2019   I have personally performed a  face to face diagnostic evaluation, including, but not limited to relevant history and physical exam findings, of this patient and developed relevant assessment and plan.  Additionally, I have reviewed and concur with the physician assistant's documentation above.  Leeroy Cha, MD

## 2019-08-12 NOTE — Procedures (Signed)
Cortrak  Person Inserting Tube:  Rahaf Carbonell, RD Tube Type:  Cortrak - 43 inches Tube Location:  Left nare Initial Placement:  Stomach Secured by: Bridle Technique Used to Measure Tube Placement:  Documented cm marking at nare/ corner of mouth Cortrak Secured At:  63 cm   No x-ray is required. RN may begin using tube.    If the tube becomes dislodged please keep the tube and contact the Cortrak team at [email or web address] (password [email or web address]) for replacement.  If after hours and replacement cannot be delayed, place a NG tube and confirm placement with an abdominal x-ray.    Mariana Single RD, LDN Clinical Nutrition Pager # [phone]

## 2019-08-12 NOTE — Progress Notes (Signed)
Physical Therapy Treatment Patient Details Name: Andres [name] MRN: [id number] DOB: [birth year] Today's Date: 08/12/2019    History of Present Illness 79 yo male from home found laying on floor with incomprehensiable speech, L side weakness and not follow commands. Pt noted to be incontinent of urine.  R gaze preference and L side hemiparesis  CT (+) Hyperdense right middle cerebral artery distal M1 and proximal M2segments.PMH CAD with CABG 1997 DM HTn diverticulosis hiatal hernia head mass hepatitis lowe backpain mitral valve prolapse obese CVA hx smoking 29 years    PT Comments    Pt performed transfer back to bed this pm.  Pt required assistance to stand and pivot back to bed without device.  Skilled assist needed for back to bed as unit did not have lift pads.     Follow Up Recommendations  CIR;Supervision/Assistance - 24 hour     Equipment Recommendations  Other (comment)(TBA)    Recommendations for Other Services Rehab consult     Precautions / Restrictions Precautions Precautions: Fall Restrictions Weight Bearing Restrictions: No    Mobility  Bed Mobility Overal bed mobility: Needs Assistance Bed Mobility: Sit to Supine Rolling: Max assist Sidelying to sit: Max assist;+2 for physical assistance   Sit to supine: Max assist;+2 for physical assistance   General bed mobility comments: Assistance to lower trunk and lift B LEs back to bed.  +2 total assistance to boost to [facility].  Transfers Overall transfer level: Needs assistance Equipment used: 2 person hand held assist(without device.) Transfers: Stand Pivot Transfers;Sit to/from Stand Sit to Stand: +2 physical assistance;Max assist         General transfer comment: Max +2 to stand without stedy.  Pt required max +2 to pivot hips.  Pt required increased assistance this pm for back to bed.  Ambulation/Gait Ambulation/Gait assistance: (NT)               Stairs             Wheelchair Mobility     Modified Rankin (Stroke Patients Only)       Balance Overall balance assessment: Needs assistance Sitting-balance support: No upper extremity supported;Feet supported Sitting balance-Leahy Scale: Poor Sitting balance - Comments: Posterior push and lean.     Standing balance-Leahy Scale: Poor Standing balance comment: +2 external assistance to maintain standing balance.                            Cognition Arousal/Alertness: Awake/alert Behavior During Therapy: Flat affect;Restless Overall Cognitive Status: Impaired/Different from baseline Area of Impairment: Orientation;Attention;Memory;Following commands;Safety/judgement;Awareness;Problem solving                 Orientation Level: Disoriented to;Place;Time;Situation Current Attention Level: Focused Memory: Decreased short-term memory Following Commands: Follows one step commands inconsistently;Follows one step commands with increased time Safety/Judgement: Decreased awareness of safety;Decreased awareness of deficits Awareness: Intellectual Problem Solving: Slow processing;Difficulty sequencing;Requires verbal cues General Comments: Pt's daughter present at bed side with multiple questions.  All questions answered and she was happy to see her father participate during session.  He continues to require multimodal cues to engage in activity.      Exercises      General Comments        Pertinent Vitals/Pain Pain Assessment: No/denies pain Faces Pain Scale: No hurt    Home Living  Prior Function            PT Goals (current goals can now be found in the care plan section) Acute Rehab PT Goals Patient Stated Goal: Unstated PT Goal Formulation: Patient unable to participate in goal setting Potential to Achieve Goals: Fair Progress towards PT goals: Progressing toward goals    Frequency    Min 4X/week      PT Plan Current plan remains appropriate     Co-evaluation              AM-PAC PT "6 Clicks" Mobility   Outcome Measure  Help needed turning from your back to your side while in a flat bed without using bedrails?: Total Help needed moving from lying on your back to sitting on the side of a flat bed without using bedrails?: Total Help needed moving to and from a bed to a chair (including a wheelchair)?: Total Help needed standing up from a chair using your arms (e.g., wheelchair or bedside chair)?: Total Help needed to walk in hospital room?: Total Help needed climbing 3-5 steps with a railing? : Total 6 Click Score: 6    End of Session Equipment Utilized During Treatment: Gait belt Activity Tolerance: Patient limited by fatigue Patient left: in bed;with call bell/phone within reach;with bed alarm set Nurse Communication: Mobility status PT Visit Diagnosis: Other abnormalities of gait and mobility (R26.89);Other symptoms and signs involving the nervous system (R29.898)     Time: [id number] PT Time Calculation (min) (ACUTE ONLY): 8 min  Charges:  $Therapeutic Activity: 8-22 mins                     Erasmo Leventhal , PTA Acute Rehabilitation Services Pager [phone] Office [phone]     Leotis Isham Eli Hose 08/12/2019, 4:00 PM

## 2019-08-12 NOTE — TOC Initial Note (Signed)
Transition of Care [facility]) - Initial/Assessment Note    Patient Details  Name: Andres [name] MRN: [id number] Date of Birth: 29-Oct-1940  Transition of Care [facility]) CM/SW Contact:    Pollie Friar, RN Phone Number: 08/12/2019, 2:01 PM  Clinical Narrative:                 Pt had cortrak placed today. CIR following for potential admit. TOC following for other d/c needs.   Expected Discharge Plan: IP Rehab Facility Barriers to Discharge: Continued Medical Work up   Patient Goals and CMS Choice     Choice offered to / list presented to : Adult Children  Expected Discharge Plan and Services Expected Discharge Plan: [place]   Discharge Planning Services: CM Consult Post Acute Care Choice: IP Rehab                                        Prior Living Arrangements/Services   Lives with:: Adult Children          Need for Family Participation in Patient Care: Yes (Comment)        Activities of Daily Living Home Assistive Devices/Equipment: None ADL Screening (condition at time of admission) Patient's cognitive ability adequate to safely complete daily activities?: Yes Is the patient deaf or have difficulty hearing?: No Does the patient have difficulty seeing, even when wearing glasses/contacts?: No Does the patient have difficulty concentrating, remembering, or making decisions?: No Patient able to express need for assistance with ADLs?: No Does the patient have difficulty dressing or bathing?: Yes Independently performs ADLs?: No Communication: Needs assistance Is this a change from baseline?: Change from baseline, expected to last <3 days Dressing (OT): Dependent Is this a change from baseline?: Change from baseline, expected to last <3days Grooming: Dependent Is this a change from baseline?: Change from baseline, expected to last <3 days Feeding: Dependent Is this a change from baseline?: Change from baseline, expected to last <3 days Bathing:  Dependent Is this a change from baseline?: Change from baseline, expected to last <3 days Toileting: Dependent Is this a change from baseline?: Change from baseline, expected to last <3 days In/Out Bed: Dependent Is this a change from baseline?: Change from baseline, expected to last <3 days Walks in Home: Independent Does the patient have difficulty walking or climbing stairs?: No Weakness of Legs: None Weakness of Arms/Hands: None  Permission Sought/Granted                  Emotional Assessment           Psych Involvement: No (comment)  Admission diagnosis:  CVA (cerebral vascular accident) ([place]) [I63.9] Stroke ([place]) [I63.9] Acute ischemic stroke [facility]) [I63.9] Patient Active Problem List   Diagnosis Date Noted  . Acute ischemic stroke ([place]) 08/09/2019  . Transaminitis 08/09/2019  . History of seizures 08/09/2019  . Left hip pain 03/02/2019  . Syncope 09/05/2018  . Gout 07/10/2018  . Scrotal swelling 06/09/2018  . Urinary dribbling 06/03/2017  . Mass of scrotum 06/03/2017  . Injury of left hand 07/21/2015  . Hx of   sessile serrated colonic polyp 02/27/2015  . Chronic anticoagulation 12/22/2014  . Colon cancer screening 12/22/2014  . Morbid obesity ([place]) 09/29/2014  . Mitral valve prolapse 11/24/2013  . IT band syndrome 09/23/2013  . Hypotension, unspecified 07/21/2013  . Dyspnea 07/21/2013  . Elevated troponin I level- (pk  Troponin 0.34 in setting of acute renal insufficency) 07/21/2013  . 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 Last nuc 2012 negative. 07/21/2013  . SBO (small bowel obstruction) ([place]) 07/21/2013  . Inguinal hernia, Rt reduced, Lt present 07/21/2013  . Acute renal failure ([place]) 07/21/2013  . Pneumonia, aspiration ([place]) 07/21/2013  . Obesity (BMI 30-39.9) 01/17/2013  . Osteoarthritis, hand, primary localized 08/12/2012  . Preventative health care 04/11/2011  . MILD COGNITIVE IMPAIRMENT SO STATED 10/03/2010  . CEREBROVASCULAR  ACCIDENT, HX OF 10/03/2010  . RASH-NONVESICULAR 05/12/2008  . TRANSIENT ISCHEMIC ATTACK 01/27/2008  . Diabetes ([place]) 12/08/2007  . ALLERGIC RHINITIS 12/08/2007  . DIVERTICULOSIS, COLON 12/08/2007  . LOW BACK PAIN 12/08/2007  . NEPHROLITHIASIS, HX OF 12/08/2007  . BPH (benign prostatic hyperplasia) 04/05/2007  . Hyperlipidemia 04/02/2007  . CARPAL TUNNEL SYNDROME, BILATERAL 04/02/2007  . CATARACT NOS 04/02/2007  . Essential hypertension 04/02/2007   PCP:  [name], MD Pharmacy:   [facility] [place], [address] [place] [address] Phone: [phone] Fax: [phone]     Social Determinants of Health (SDOH) Interventions    Readmission Risk Interventions No flowsheet data found.

## 2019-08-12 NOTE — Progress Notes (Addendum)
PROGRESS NOTE    Andres [name]  [id number] DOB: 01/28/1941 DOA: 08/09/2019 PCP: [name], MD   Brief Narrative: Andres [name] is a 79 y.o. male with a history of hypertension, hyperlipidemia, CAD status post CABG, carotid artery stenosis, CVA, mitral valve prolapse, diabetes mellitus type 2, BPH, hepatitis, GERD.  Patient presented secondary to being found down with associated left-sided weakness and dysarthria and found to have an acute right MCA stroke.  Patient with significant dysphagia secondary to stroke requiring alternate means of nutrition at this time.   Assessment & Plan:   Principal Problem:   Acute ischemic stroke [facility]) Active Problems:   Hyperlipidemia   Essential hypertension   BPH (benign prostatic hyperplasia)   CAD (coronary artery disease), hx of CABG 1997 X 6. Last nuc 2012 negative.   Morbid obesity ([place])   Transaminitis   History of seizures   Acute right MCA stroke Patient with left sided weakness and dysarthria. MRI significant for large area of acute infarct of right MCA territory. LDL of 100. Hemoglobin A1C of 6.2%. Transthoracic Echocardiogram significant for no evidence of embolic source. PT/OT recommendations pending.significant dysphagia and left hemiplegia. Speech therapy recommending n.p.o and tube feeds. Discussed need for NG tube with patient and he understands. -Cortrak NG tube ordered; dietitian for tube feeds -Neurology recommendations: Dual antiplatelet therapy for 3 months.  Will need to clarify regimen past 3 months. -PT/OT recommendations: CIR  Delirium Unknown etiology. Patient given ativan with adverse effect. Delirium improved. Mental status improved as a whole.  Dysphagia In setting of acute stroke.  Speech therapy recommending alternate means of nutrition and n.p.o.  As patient has failed her swallow study. Cortrak placed 1/8 and tube feeds initiated. -Tube feeds  History of seizures Patient is on Keppra 500 mg BID as an  outpatient. EEG obtained with no evidence of seizures.  Essential hypertension Mildly uncontrolled.  Patient is on HCTZ, losartan, metoprolol as an outpatient.  These were held on admission secondary to acute stroke. -Metoprolol -Add back losartan and HCTZ as needed  Diabetes mellitus, type II Controlled.  Hemoglobin A1c of 6.2%.  Patient is not on outpatient regimen currently.  CAD Patient has a history of CABG in 1997.  Patient is on Lipitor, aspirin, Plavix, omega-3 fatty acids as an outpatient. -Continue aspirin, Plavix, Lipitor  CKD stage III  Stable  Elevated AST Minimally elevated  BPH -Continue Flomax  Hyperlipidemia LDL 62.  Patient is on Lipitor and omega-3 fatty acid as an outpatient. -Continue Lipitor 40 mg daily  Morbid obesity Body mass index is 35.98 kg/m.   DVT prophylaxis: SCDs Code Status:   Code Status: Full Code Family Communication: Daughter via telephone Disposition Plan: Discharge per therapy recommendations   Consultants:   Neurology  Procedures:   08/09/2019: Transthoracic Echocardiogram IMPRESSIONS    1. Left ventricular ejection fraction, by visual estimation, is 60%. The left ventricle has normal function. There is no left ventricular hypertrophy.  2. Abnormal septal motion secondary to conduction delay.  3. Elevated left ventricular end-diastolic pressure.  4. Left ventricular diastolic parameters are consistent with Grade II diastolic dysfunction (pseudonormalization).  5. Global right ventricle has normal systolic function.The right ventricular size is normal. No increase in right ventricular wall thickness.  6. Left atrial size was normal.  7. Right atrial size was normal.  8. The mitral valve is abnormal. Trivial mitral valve regurgitation. No evidence of mitral stenosis.  9. Mild bileaflet mitral valve prolapse. 10. The tricuspid valve is  normal in structure. 11. The aortic valve is normal in structure. Aortic valve  regurgitation is not visualized. No evidence of aortic valve sclerosis or stenosis. 12. The pulmonic valve was normal in structure. Pulmonic valve regurgitation is trivial. 13. The inferior vena cava is normal in size with greater than 50% respiratory variability, suggesting right atrial pressure of 3 mmHg. 14. The interatrial septum was not well visualized. 15. No intracardiac source of emboli noted.   1/5: EEG DESCRIPTION: No clear posterior dominant rhythm was seen. EEG showed continuous generalized and lateralized right hemisphere 3-6hz  theta-delta slowing. Hyperventilation and photic stimulation were not performed.  ABNORMALITY - Continuous slow, generalized and lateralized right hemisphere  IMPRESSION: This study is suggestive of cortical dysfunction in right hemisphere consistent with underlying infarct. Additionally, there is evidence of moderate diffuse encephalopathy. No seizures or epileptiform discharges were seen throughout the recording.  Antimicrobials:  None    Subjective: No issues noted  Objective: Vitals:   08/11/19 2054 08/12/19 0000 08/12/19 0432 08/12/19 1111  BP: (!) 145/100 (!) 162/86 (!) 171/93 (!) 147/94  Pulse: 96 (!) 109 (!) 103 94  Resp: 20 20 20 20   Temp: 97.8 F (36.6 C) 98.1 F (36.7 C) 98.1 F (36.7 C) 99.6 F (37.6 C)  TempSrc: Oral Oral Oral Oral  SpO2: 97% 100% 100% 96%  Weight:      Height:        Intake/Output Summary (Last 24 hours) at 08/12/2019 1149 Last data filed at 08/12/2019 0436 Gross per 24 hour  Intake --  Output 200 ml  Net -200 ml   Filed Weights   08/09/19 0100 08/09/19 0153 08/10/19 2006  Weight: 111.2 kg 111.2 kg 104.2 kg    Examination:  General exam: Appears calm and comfortable Respiratory system: Decreased breath sounds. Respiratory effort normal. Cardiovascular system: S1 & S2 heard, RRR. No murmurs, rubs, gallops or clicks. Gastrointestinal system: Abdomen is nondistended, soft and nontender. No  organomegaly or masses felt. Normal bowel sounds heard. Central nervous system: Alert and oriented. Significant dysarthria Extremities: No edema. No calf tenderness Skin: No cyanosis. No rashes Psychiatry: Blunt affect    Data Reviewed: I have personally reviewed following labs and imaging studies  CBC: Recent Labs  Lab 08/09/19 0254 08/09/19 0703 08/10/19 0316  WBC  --  8.8 7.0  NEUTROABS  --  6.3  --   HGB 13.3 13.8 12.5*  HCT 39.0 40.2 36.0*  36.5*  MCV  --  100.5* 99.4  PLT  --  124* [id number]   Basic Metabolic Panel: Recent Labs  Lab 08/09/19 0254 08/09/19 0703 08/12/19 0505  NA 137 141 138  K 5.7* 4.9 4.3  CL 103 103 102  CO2  --  27 20*  GLUCOSE 127* 111* 89  BUN 30* 21 12  CREATININE 1.30* 1.35* 1.24  CALCIUM  --  9.8 9.1   GFR: Estimated Creatinine Clearance: 54.6 mL/min (by C-G formula based on SCr of 1.24 mg/dL). Liver Function Tests: Recent Labs  Lab 08/09/19 0703  AST 45*  ALT 26  ALKPHOS 70  BILITOT 0.4  PROT 6.8  ALBUMIN 4.1   No results for input(s): LIPASE, AMYLASE in the last 168 hours. No results for input(s): AMMONIA in the last 168 hours. Coagulation Profile: Recent Labs  Lab 08/09/19 0243 08/09/19 0703 08/09/19 0920  INR 1.1 1.1 1.0   Cardiac Enzymes: No results for input(s): CKTOTAL, CKMB, CKMBINDEX, TROPONINI in the last 168 hours. BNP (last 3 results) No results for input(s):  PROBNP in the last 8760 hours. HbA1C: Recent Labs    08/10/19 0316  HGBA1C 6.2*   CBG: Recent Labs  Lab 08/11/19 1607 08/11/19 1949 08/12/19 0450 08/12/19 0839 08/12/19 1108  GLUCAP 86 84 93 99 108*   Lipid Profile: Recent Labs    08/10/19 0316  CHOL 152  HDL 36*  LDLCALC 100*  TRIG 80  CHOLHDL 4.2   Thyroid Function Tests: No results for input(s): TSH, T4TOTAL, FREET4, T3FREE, THYROIDAB in the last 72 hours. Anemia Panel: Recent Labs    08/10/19 0316  VITAMINB12 595   Sepsis Labs: No results for input(s): PROCALCITON,  LATICACIDVEN in the last 168 hours.  Recent Results (from the past 240 hour(s))  SARS CORONAVIRUS 2 (TAT 6-24 HRS) Nasopharyngeal Nasopharyngeal Swab     Status: None   Collection Time: 08/09/19 11:30 AM   Specimen: Nasopharyngeal Swab  Result Value Ref Range Status   SARS Coronavirus 2 NEGATIVE NEGATIVE Final    Comment: (NOTE) SARS-CoV-2 target nucleic acids are NOT DETECTED. The SARS-CoV-2 RNA is generally detectable in upper and lower respiratory specimens during the acute phase of infection. Negative results do not preclude SARS-CoV-2 infection, do not rule out co-infections with other pathogens, and should not be used as the sole basis for treatment or other patient management decisions. Negative results must be combined with clinical observations, patient history, and epidemiological information. The expected result is Negative. Fact Sheet for Patients: SugarRoll.be Fact Sheet for Healthcare Providers: https://www.woods-mathews.com/ This test is not yet approved or cleared by the [place] FDA and  has been authorized for detection and/or diagnosis of SARS-CoV-2 by FDA under an Emergency Use Authorization (EUA). This EUA will remain  in effect (meaning this test can be used) for the duration of the COVID-19 declaration under Section 56 4(b)(1) of the Act, 21 U.S.C. section 360bbb-3(b)(1), unless the authorization is terminated or revoked sooner. Performed at [facility], [place] [address] [place], [place] [address]          Radiology Studies: No results found.      Scheduled Meds: .  stroke: mapping our early stages of recovery book   Does not apply Once  . aspirin  300 mg Rectal Daily   Or  . aspirin  81 mg Oral Daily  . atorvastatin  40 mg Oral Daily  . clopidogrel  75 mg Oral Daily  . feeding supplement (PRO-STAT SUGAR FREE 64)  30 mL Per Tube Daily  . tamsulosin  0.4 mg Oral Daily   Continuous  Infusions: . sodium chloride 75 mL/hr at 08/12/19 0402  . feeding supplement (JEVITY 1.2 CAL) 1,000 mL (08/12/19 1117)  . levETIRAcetam 500 mg (08/12/19 1115)     LOS: 3 days     Cordelia Poche, MD [facility] 08/12/2019, 11:49 AM  If 7PM-7AM, please contact night-coverage [email or web address]

## 2019-08-12 NOTE — Progress Notes (Signed)
Inpatient Rehabilitation Admissions Coordinator  I met with patient at bedside for rehab assessment. Pt sleeping in recliner. I then contacted both his son, Lanny Hurst and his daughter, [name], by phone. We discussed goals and expectations of an inpt rehab admit. [name] would like to pursue CIR admit. I will begin insurance authorization with [employer] Medicare advantage plan for a possible admit pending their approval and bed next week. I will follow up on Monday.  Danne Baxter, RN, MSN Rehab Admissions Coordinator [phone] 08/12/2019 2:56 PM

## 2019-08-12 NOTE — Progress Notes (Signed)
STROKE TEAM PROGRESS NOTE   INTERVAL HISTORY Patient lying in bed.drowsy but arousable and follows commands. Remains dysarthric with dysphagia and cortrack tube ordered for feeding. Spoke to daughter at bedside who wants him to go to [facility] and home with her after that  Vitals:   08/11/19 2054 08/12/19 0000 08/12/19 0432 08/12/19 1111  BP: (!) 145/100 (!) 162/86 (!) 171/93 (!) 147/94  Pulse: 96 (!) 109 (!) 103 94  Resp: 20 20 20 20   Temp: 97.8 F (36.6 C) 98.1 F (36.7 C) 98.1 F (36.7 C) 99.6 F (37.6 C)  TempSrc: Oral Oral Oral Oral  SpO2: 97% 100% 100% 96%  Weight:      Height:        CBC:  Recent Labs  Lab 08/09/19 0703 08/10/19 0316  WBC 8.8 7.0  NEUTROABS 6.3  --   HGB 13.8 12.5*  HCT 40.2 36.0*  36.5*  MCV 100.5* 99.4  PLT 124* [id number]    Basic Metabolic Panel:  Recent Labs  Lab 08/09/19 0703 08/12/19 0505  NA 141 138  K 4.9 4.3  CL 103 102  CO2 27 20*  GLUCOSE 111* 89  BUN 21 12  CREATININE 1.35* 1.24  CALCIUM 9.8 9.1   Lipid Panel:     Component Value Date/Time   CHOL 152 08/10/2019 0316   TRIG 80 08/10/2019 0316   HDL 36 (L) 08/10/2019 0316   CHOLHDL 4.2 08/10/2019 0316   VLDL 16 08/10/2019 0316   LDLCALC 100 (H) 08/10/2019 0316   HgbA1c:  Lab Results  Component Value Date   HGBA1C 6.2 (H) 08/10/2019   Urine Drug Screen:     Component Value Date/Time   LABOPIA NONE DETECTED 09/05/2018 1213   COCAINSCRNUR NONE DETECTED 09/05/2018 1213   LABBENZ NONE DETECTED 09/05/2018 1213   AMPHETMU NONE DETECTED 09/05/2018 1213   THCU NONE DETECTED 09/05/2018 1213   LABBARB NONE DETECTED 09/05/2018 1213    Alcohol Level     Component Value Date/Time   ETH <10 09/05/2018 1055    IMAGING past 48 hours No results found.  PHYSICAL EXAM    Elderly African-American male not in distress. . Afebrile. Head is nontraumatic. Neck is supple without bruit.    Cardiac exam no murmur or gallop. Lungs are clear to auscultation. Distal pulses are well  felt. Neurological Exam ; Patient is awake alert he has dysarthric speech but can be understood.  Follows simple midline and one-step commands.    He blinks to threat on the right but not on the left.  He has moderate left lower facial weakness.  He has right gaze preference and can look to the left only through midline.  Tongue is midline.  He has left hemiparesis but withdraws left upper and lower extremity to painful stimuli only.  Is unable to lift left upper and lower extremity against gravity.  He has purposeful movements on the right side without weakness.  ASSESSMENT/PLAN [name] is a 79 y.o. male with history of stroke, HTN, HLD, DB, CAD, obesity, carotid disease, seizures  found down by son at home confused with L sided weakness.   Stroke:   Acute R MCA infarct surrounding of R MCA parietal infarct, due to right M1 occlusion  Code Stroke CT head Hyperdense R MCA distal M1 and proximal M2. ASPECTS 10.     CTA head R M1 M2 short segment occlusion w/ generalized attentuation R MCA.   CTA neck ICA atherosclerosis w/o stenosis  CT perfusion area c/w old infarct, possible small acute anterior division   MRI  Large R MCA infarct surrounding old R MCA parietal infarct   2D Echo  normal  LDL 62  HgbA1c 5.6  SCDs for VTE prophylaxis  aspirin 81 mg daily and clopidogrel 75 mg daily prior to admission, now on aspirin 81 mg daily and clopidogrel 75 mg daily. Given inability to swallow, on aspirin suppository   Therapy recommendations:  pending   Disposition:  pending   Hx Seizures  EEG pending   On Keppra 500 bid  Admitted w/ seizures 09/2018  Followed by Dr. Rexene Alberts  Hypertension  Elevated but Stable . Permissive hypertension (OK if < 220/120) but gradually normalize in 5-7 days . Long-term BP goal normotensive  Hyperlipidemia  Home meds:  lipitor 40 and omega 3  Resume statin in hospital once po access  LDL 62, goal < 70  Continue statin at  discharge  Diabetes type II Controlled  HgbA1c 5.6, goal < 7.0  CBGs Recent Labs    08/12/19 0450 08/12/19 0839 08/12/19 1108  GLUCAP 93 99 108*      SSI  Dysphagia . Secondary to stroke . Failed swallow . NPO . Speech on board   Other Stroke Risk Factors  Advanced age  Former Cigarette smoker, quit 29 yrs ago  Morbid Obesity, Body mass index is 35.98 kg/m., recommend weight loss, diet and exercise as appropriate   Hx stroke/TIA  02/2008 - R MCA infarct w/ petechial hemorrhage    Coronary artery disease s/p CABG  Other Active Problems  CKD Cre 1.35  Thrombocytopenia PLT 124  Hospital day # 3     He presented with left hemiplegia with worsening of his deficits secondary to right MCA infarct and extension of his old infarct and likely right MCA occlusion from an underlying intracranial atherosclerosis.Continue dual antiplatelet therapy for 3 months. Continue ongoing therapies and transfer to [facility] next week.   Discussed with Dr.Nettey, daughter and rehab coordinator. Stroke team will sign off. Call for questions. Greater than 50% time during this 25-minute visit was spent on counseling and coordination of care and answering questions Antony Contras, MD Medical Director [place] Pager: [phone] 08/12/2019 2:07 PM   To contact Stroke Continuity provider, please refer to [email or web address] After hours, contact General Neurology

## 2019-08-12 NOTE — Progress Notes (Signed)
SLP Cancellation Note  Patient Details Name: Andres [name] MRN: [id number] DOB: 1941-04-16   Cancelled treatment:       Reason Eval/Treat Not Completed: Fatigue/lethargy limiting ability to participate  Patient is insufficiently arouseable for PO trials. Pt had CorTrak placed earlier this date. ST will continue efforts.   Marina Goodell, M.Ed., CCC-SLP Speech Therapy Acute Rehabilitation [phone]    Marina Goodell 08/12/2019, 2:50 PM

## 2019-08-12 NOTE — Care Management Important Message (Signed)
Important Message  Patient Details  Name: Andres [name] MRN: [id number] Date of Birth: 1941/04/23   Medicare Important Message Given:  Yes     [name] 08/12/2019, 3:23 PM

## 2019-08-12 NOTE — Progress Notes (Addendum)
Physical Therapy Treatment Patient Details Name: Andres [name] MRN: [id number] DOB: Aug 09, 1940 Today's Date: 08/12/2019    History of Present Illness 79 yo male from home found laying on floor with incomprehensiable speech, L side weakness and not follow commands. Pt noted to be incontinent of urine.  R gaze preference and L side hemiparesis  CT (+) Hyperdense right middle cerebral artery distal M1 and proximal M2segments.PMH CAD with CABG 1997 DM HTn diverticulosis hiatal hernia head mass hepatitis lowe backpain mitral valve prolapse obese CVA hx smoking 29 years    PT Comments    Pt performed progression to standing with in sara stedy sit to stand frame.  He required hand over hand assistance on LUE to maintain position.  He presents with involuntary myoclonic jerking on L side.  He continues to benefit from aggressive CIR therapies to improve strength and functional mobility.  Plan next session for continues standing tolerance.  Pt resting in recliner with chair alarm activated.     Follow Up Recommendations  CIR;Supervision/Assistance - 24 hour     Equipment Recommendations  Other (comment)(TBA)    Recommendations for Other Services Rehab consult     Precautions / Restrictions Precautions Precautions: Fall Restrictions Weight Bearing Restrictions: No    Mobility  Bed Mobility Overal bed mobility: Needs Assistance Bed Mobility: Rolling;Sidelying to Sit Rolling: Max assist Sidelying to sit: Max assist;+2 for physical assistance       General bed mobility comments: Pt with involuntary movement on L side .  He required assistance to advance LEs to edge of bed and elevate trunk into a seated position.  He presents with posterior lean and pushes backward with myoclonic jerking on L side.  Transfers Overall transfer level: Needs assistance Equipment used: Ambulation equipment used(sara stedy) Transfers: Sit to/from Stand Sit to Stand: Mod assist;+2 physical assistance         General transfer comment: Mod A +2 to power up into standing using stedy.He required hand over hand assistance to place L hand on stedy and keep it present during session.  Pt required cues for sequencing on R side and multimodal cues to come to standing.  Ambulation/Gait Ambulation/Gait assistance: (NT)               Stairs             Wheelchair Mobility    Modified Rankin (Stroke Patients Only)       Balance Overall balance assessment: Needs assistance Sitting-balance support: No upper extremity supported;Feet supported Sitting balance-Leahy Scale: Poor Sitting balance - Comments: Posterior push and lean.     Standing balance-Leahy Scale: Poor Standing balance comment: +2 external assistance to maintain standing balance.                            Cognition Arousal/Alertness: Awake/alert;Lethargic Behavior During Therapy: Flat affect;Restless Overall Cognitive Status: Impaired/Different from baseline Area of Impairment: Orientation;Attention;Memory;Following commands;Safety/judgement;Awareness;Problem solving                 Orientation Level: Disoriented to;Place;Time;Situation Current Attention Level: Focused Memory: Decreased short-term memory Following Commands: Follows one step commands inconsistently;Follows one step commands with increased time Safety/Judgement: Decreased awareness of safety;Decreased awareness of deficits Awareness: Intellectual Problem Solving: Slow processing;Difficulty sequencing;Requires verbal cues General Comments: Pt's daughter present at bed side with multiple questions.  All questions answered and she was happy to see her father participate during session.  He continues to require multimodal cues to  engage in activity.      Exercises      General Comments        Pertinent Vitals/Pain Pain Assessment: Faces Faces Pain Scale: No hurt    Home Living                      Prior Function             PT Goals (current goals can now be found in the care plan section) Acute Rehab PT Goals Patient Stated Goal: Unstated PT Goal Formulation: Patient unable to participate in goal setting Potential to Achieve Goals: Fair Progress towards PT goals: Progressing toward goals    Frequency    Min 4X/week      PT Plan Current plan remains appropriate    Co-evaluation              AM-PAC PT "6 Clicks" Mobility   Outcome Measure  Help needed turning from your back to your side while in a flat bed without using bedrails?: Total Help needed moving from lying on your back to sitting on the side of a flat bed without using bedrails?: Total Help needed moving to and from a bed to a chair (including a wheelchair)?: Total Help needed standing up from a chair using your arms (e.g., wheelchair or bedside chair)?: Total Help needed to walk in hospital room?: Total Help needed climbing 3-5 steps with a railing? : Total 6 Click Score: 6    End of Session Equipment Utilized During Treatment: Gait belt Activity Tolerance: Patient limited by fatigue Patient left: in bed;with call bell/phone within reach;with bed alarm set Nurse Communication: Mobility status PT Visit Diagnosis: Other abnormalities of gait and mobility (R26.89);Other symptoms and signs involving the nervous system (R29.898)     Time: [id number] PT Time Calculation (min) (ACUTE ONLY): 34 min  Charges:  $Therapeutic Activity: 23-37 mins                     Erasmo Leventhal , PTA Acute Rehabilitation Services Pager [phone] Office [phone]     Elif Yonts Eli Hose 08/12/2019, 2:08 PM

## 2019-08-13 LAB — GLUCOSE, CAPILLARY
Glucose-Capillary: 81 mg/dL (ref 70–99)
Glucose-Capillary: 106 mg/dL — ABNORMAL HIGH (ref 70–99)
Glucose-Capillary: 136 mg/dL — ABNORMAL HIGH (ref 70–99)
Glucose-Capillary: 124 mg/dL — ABNORMAL HIGH (ref 70–99)
Glucose-Capillary: 146 mg/dL — ABNORMAL HIGH (ref 70–99)
Glucose-Capillary: 152 mg/dL — ABNORMAL HIGH (ref 70–99)

## 2019-08-13 MED ORDER — POLYETHYLENE GLYCOL 3350 17 G PO PACK
17.0000 g | PACK | Freq: Every day | ORAL | Status: DC | PRN
  Administered 2019-08-16: 17 g via ORAL
  Filled 2019-08-13: qty 1

## 2019-08-13 NOTE — Progress Notes (Signed)
PROGRESS NOTE    JETT DIVENS  [id number] DOB: 12-09-1940 DOA: 08/09/2019 PCP: [name], MD   Brief Narrative: ARNOLD HEATHERINGTON is a 79 y.o. male with a history of hypertension, hyperlipidemia, CAD status post CABG, carotid artery stenosis, CVA, mitral valve prolapse, diabetes mellitus type 2, BPH, hepatitis, GERD.  Patient presented secondary to being found down with associated left-sided weakness and dysarthria and found to have an acute right MCA stroke.  Patient with significant dysphagia secondary to stroke requiring alternate means of nutrition at this time.   Assessment & Plan:   Principal Problem:   Acute ischemic stroke [facility]) Active Problems:   Hyperlipidemia   Essential hypertension   BPH (benign prostatic hyperplasia)   CAD (coronary artery disease), hx of CABG 1997 X 6. Last nuc 2012 negative.   Morbid obesity ([place])   Transaminitis   History of seizures   Acute right MCA stroke Patient with left sided weakness and dysarthria. MRI significant for large area of acute infarct of right MCA territory. LDL of 100. Hemoglobin A1C of 6.2%. Transthoracic Echocardiogram significant for no evidence of embolic source. PT/OT recommendations pending.significant dysphagia and left hemiplegia. Speech therapy recommending n.p.o and tube feeds. Discussed need for NG tube with patient and he understands. -Cortrak NG tube ordered; dietitian for tube feeds -Neurology recommendations: Dual antiplatelet therapy for 3 months, followed by Plavix alone -PT/OT recommendations: CIR  Delirium Unknown etiology. Patient given ativan with adverse effect. Delirium improved. Mental status improved as a whole.  Dysphagia In setting of acute stroke.  Speech therapy recommending alternate means of nutrition and n.p.o.  As patient has failed her swallow study. Cortrak placed 1/8 and tube feeds initiated. -Tube feeds  History of seizures Patient is on Keppra 500 mg BID as an outpatient. EEG  obtained with no evidence of seizures.  Essential hypertension Mildly uncontrolled.  Patient is on HCTZ, losartan, metoprolol as an outpatient.  These were held on admission secondary to acute stroke. -Metoprolol -Add back losartan and HCTZ as needed  Diabetes mellitus, type II Controlled.  Hemoglobin A1c of 6.2%.  Patient is not on outpatient regimen currently.  CAD Patient has a history of CABG in 1997.  Patient is on Lipitor, aspirin, Plavix, omega-3 fatty acids as an outpatient. -Continue aspirin, Plavix, Lipitor  CKD stage III  Stable  Elevated AST Minimally elevated  BPH -Continue Flomax  Hyperlipidemia LDL 62.  Patient is on Lipitor and omega-3 fatty acid as an outpatient. -Continue Lipitor 40 mg daily  Morbid obesity Body mass index is 35.98 kg/m.   DVT prophylaxis: SCDs Code Status:   Code Status: Full Code Family Communication: Daughter via telephone Disposition Plan: Discharge per therapy recommendations   Consultants:   Neurology  Procedures:   08/09/2019: Transthoracic Echocardiogram IMPRESSIONS    1. Left ventricular ejection fraction, by visual estimation, is 60%. The left ventricle has normal function. There is no left ventricular hypertrophy.  2. Abnormal septal motion secondary to conduction delay.  3. Elevated left ventricular end-diastolic pressure.  4. Left ventricular diastolic parameters are consistent with Grade II diastolic dysfunction (pseudonormalization).  5. Global right ventricle has normal systolic function.The right ventricular size is normal. No increase in right ventricular wall thickness.  6. Left atrial size was normal.  7. Right atrial size was normal.  8. The mitral valve is abnormal. Trivial mitral valve regurgitation. No evidence of mitral stenosis.  9. Mild bileaflet mitral valve prolapse. 10. The tricuspid valve is normal in structure. 11. The  aortic valve is normal in structure. Aortic valve regurgitation is not  visualized. No evidence of aortic valve sclerosis or stenosis. 12. The pulmonic valve was normal in structure. Pulmonic valve regurgitation is trivial. 13. The inferior vena cava is normal in size with greater than 50% respiratory variability, suggesting right atrial pressure of 3 mmHg. 14. The interatrial septum was not well visualized. 15. No intracardiac source of emboli noted.   1/5: EEG DESCRIPTION: No clear posterior dominant rhythm was seen. EEG showed continuous generalized and lateralized right hemisphere 3-6hz  theta-delta slowing. Hyperventilation and photic stimulation were not performed.  ABNORMALITY - Continuous slow, generalized and lateralized right hemisphere  IMPRESSION: This study is suggestive of cortical dysfunction in right hemisphere consistent with underlying infarct. Additionally, there is evidence of moderate diffuse encephalopathy. No seizures or epileptiform discharges were seen throughout the recording.  Antimicrobials:  None    Subjective: No issues noted  Objective: Vitals:   08/13/19 0050 08/13/19 0432 08/13/19 1020 08/13/19 1129  BP:  (!) 159/100 (!) 167/89 (!) 151/99  Pulse: 78 85 86 68  Resp:  20 (!) 23   Temp:  98.3 F (36.8 C) 98.1 F (36.7 C)   TempSrc:  Oral Oral   SpO2: 96% 94% 99%   Weight:      Height:        Intake/Output Summary (Last 24 hours) at 08/13/2019 1152 Last data filed at 08/13/2019 0656 Gross per 24 hour  Intake 3151.55 ml  Output 300 ml  Net 2851.55 ml   Filed Weights   08/09/19 0100 08/09/19 0153 08/10/19 2006  Weight: 111.2 kg 111.2 kg 104.2 kg    Examination:  General exam: Appears calm and comfortable Respiratory system: Rhonchi. Respiratory effort normal. Cardiovascular system: S1 & S2 heard, RRR. No murmurs. Gastrointestinal system: Abdomen is nondistended, soft and nontender. Normal bowel sounds heard. Central nervous system: Alert. Oriented to person and place. Extremities: No edema. No calf  tenderness Skin: No cyanosis. No rashes Psychiatry: Judgement and insight appear normal. Mood & affect appropriate.     Data Reviewed: I have personally reviewed following labs and imaging studies  CBC: Recent Labs  Lab 08/09/19 0254 08/09/19 0703 08/10/19 0316  WBC  --  8.8 7.0  NEUTROABS  --  6.3  --   HGB 13.3 13.8 12.5*  HCT 39.0 40.2 36.0*  36.5*  MCV  --  100.5* 99.4  PLT  --  124* [id number]   Basic Metabolic Panel: Recent Labs  Lab 08/09/19 0254 08/09/19 0703 08/12/19 0505  NA 137 141 138  K 5.7* 4.9 4.3  CL 103 103 102  CO2  --  27 20*  GLUCOSE 127* 111* 89  BUN 30* 21 12  CREATININE 1.30* 1.35* 1.24  CALCIUM  --  9.8 9.1   GFR: Estimated Creatinine Clearance: 54.6 mL/min (by C-G formula based on SCr of 1.24 mg/dL). Liver Function Tests: Recent Labs  Lab 08/09/19 0703  AST 45*  ALT 26  ALKPHOS 70  BILITOT 0.4  PROT 6.8  ALBUMIN 4.1   No results for input(s): LIPASE, AMYLASE in the last 168 hours. No results for input(s): AMMONIA in the last 168 hours. Coagulation Profile: Recent Labs  Lab 08/09/19 0243 08/09/19 0703 08/09/19 0920  INR 1.1 1.1 1.0   Cardiac Enzymes: No results for input(s): CKTOTAL, CKMB, CKMBINDEX, TROPONINI in the last 168 hours. BNP (last 3 results) No results for input(s): PROBNP in the last 8760 hours. HbA1C: No results for input(s): HGBA1C in the  last 72 hours. CBG: Recent Labs  Lab 08/12/19 1552 08/12/19 2041 08/13/19 0135 08/13/19 0433 08/13/19 0808  GLUCAP 82 80 81 106* 136*   Lipid Profile: No results for input(s): CHOL, HDL, LDLCALC, TRIG, CHOLHDL, LDLDIRECT in the last 72 hours. Thyroid Function Tests: No results for input(s): TSH, T4TOTAL, FREET4, T3FREE, THYROIDAB in the last 72 hours. Anemia Panel: No results for input(s): VITAMINB12, FOLATE, FERRITIN, TIBC, IRON, RETICCTPCT in the last 72 hours. Sepsis Labs: No results for input(s): PROCALCITON, LATICACIDVEN in the last 168 hours.  Recent Results  (from the past 240 hour(s))  SARS CORONAVIRUS 2 (TAT 6-24 HRS) Nasopharyngeal Nasopharyngeal Swab     Status: None   Collection Time: 08/09/19 11:30 AM   Specimen: Nasopharyngeal Swab  Result Value Ref Range Status   SARS Coronavirus 2 NEGATIVE NEGATIVE Final    Comment: (NOTE) SARS-CoV-2 target nucleic acids are NOT DETECTED. The SARS-CoV-2 RNA is generally detectable in upper and lower respiratory specimens during the acute phase of infection. Negative results do not preclude SARS-CoV-2 infection, do not rule out co-infections with other pathogens, and should not be used as the sole basis for treatment or other patient management decisions. Negative results must be combined with clinical observations, patient history, and epidemiological information. The expected result is Negative. Fact Sheet for Patients: SugarRoll.be Fact Sheet for Healthcare Providers: https://www.woods-mathews.com/ This test is not yet approved or cleared by the [place] FDA and  has been authorized for detection and/or diagnosis of SARS-CoV-2 by FDA under an Emergency Use Authorization (EUA). This EUA will remain  in effect (meaning this test can be used) for the duration of the COVID-19 declaration under Section 56 4(b)(1) of the Act, 21 U.S.C. section 360bbb-3(b)(1), unless the authorization is terminated or revoked sooner. Performed at [facility], [place] [address] [place], [place] [address]          Radiology Studies: No results found.      Scheduled Meds: .  stroke: mapping our early stages of recovery book   Does not apply Once  . aspirin  300 mg Rectal Daily   Or  . aspirin  81 mg Oral Daily  . atorvastatin  40 mg Oral Daily  . clopidogrel  75 mg Oral Daily  . feeding supplement (PRO-STAT SUGAR FREE 64)  30 mL Per Tube Daily  . metoprolol tartrate  50 mg Per Tube BID  . tamsulosin  0.4 mg Oral Daily   Continuous Infusions: .  sodium chloride 75 mL/hr at 08/12/19 2143  . feeding supplement (JEVITY 1.2 CAL) 35 mL/hr at 08/13/19 0530  . levETIRAcetam 500 mg (08/13/19 1115)     LOS: 4 days     Cordelia Poche, MD [facility] 08/13/2019, 11:52 AM  If 7PM-7AM, please contact night-coverage [email or web address]

## 2019-08-14 ENCOUNTER — Inpatient Hospital Stay (HOSPITAL_COMMUNITY): Payer: Medicare (Managed Care)

## 2019-08-14 LAB — BASIC METABOLIC PANEL
Anion gap: 11 (ref 5–15)
BUN: 17 mg/dL (ref 8–23)
CO2: 24 mmol/L (ref 22–32)
Calcium: 9 mg/dL (ref 8.9–10.3)
Chloride: 104 mmol/L (ref 98–111)
Creatinine, Ser: 1.11 mg/dL (ref 0.61–1.24)
GFR calc Af Amer: >60 mL/min (ref >60)
GFR calc non Af Amer: >60 mL/min (ref >60)
Glucose, Bld: 180 mg/dL — ABNORMAL HIGH (ref 70–99)
Potassium: 4.2 mmol/L (ref 3.5–5.1)
Sodium: 139 mmol/L (ref 135–145)

## 2019-08-14 LAB — CBC
HCT: 33.1 % — ABNORMAL LOW (ref 39.0–52.0)
Hemoglobin: 11.2 g/dL — ABNORMAL LOW (ref 13.0–17.0)
MCH: 33.8 pg (ref 26.0–34.0)
MCHC: 33.8 g/dL (ref 30.0–36.0)
MCV: 100 fL (ref 80.0–100.0)
Platelets: 194 10*3/uL (ref 150–400)
RBC: 3.31 MIL/uL — ABNORMAL LOW (ref 4.22–5.81)
RDW: 12.4 % (ref 11.5–15.5)
WBC: 7.2 10*3/uL (ref 4.0–10.5)
nRBC: 0 % (ref 0.0–0.2)

## 2019-08-14 LAB — BLOOD GAS, ARTERIAL
Acid-Base Excess: 1.3 mmol/L (ref 0.0–2.0)
Bicarbonate: 25 mmol/L (ref 20.0–28.0)
Drawn by: 57060
FIO2: 28
O2 Saturation: 98.7 %
Patient temperature: 38.2
pCO2 arterial: 38.9 mmHg (ref 32.0–48.0)
pH, Arterial: 7.43 (ref 7.350–7.450)
pO2, Arterial: 136 mmHg — ABNORMAL HIGH (ref 83.0–108.0)

## 2019-08-14 LAB — GLUCOSE, CAPILLARY
Glucose-Capillary: 133 mg/dL — ABNORMAL HIGH (ref 70–99)
Glucose-Capillary: 135 mg/dL — ABNORMAL HIGH (ref 70–99)
Glucose-Capillary: 138 mg/dL — ABNORMAL HIGH (ref 70–99)
Glucose-Capillary: 140 mg/dL — ABNORMAL HIGH (ref 70–99)
Glucose-Capillary: 147 mg/dL — ABNORMAL HIGH (ref 70–99)
Glucose-Capillary: 154 mg/dL — ABNORMAL HIGH (ref 70–99)
Glucose-Capillary: 154 mg/dL — ABNORMAL HIGH (ref 70–99)
Glucose-Capillary: 175 mg/dL — ABNORMAL HIGH (ref 70–99)

## 2019-08-14 LAB — MAGNESIUM: Magnesium: 1.7 mg/dL (ref 1.7–2.4)

## 2019-08-14 MED ORDER — LEVETIRACETAM IN NACL 1000 MG/100ML IV SOLN
1000.0000 mg | INTRAVENOUS | Status: AC
  Administered 2019-08-14: 01:00:00 1000 mg via INTRAVENOUS
  Filled 2019-08-14: qty 100

## 2019-08-14 MED ORDER — SODIUM CHLORIDE 0.9 % IV SOLN
750.0000 mg | Freq: Two times a day (BID) | INTRAVENOUS | Status: DC
  Administered 2019-08-14 – 2019-08-19 (×11): 750 mg via INTRAVENOUS
  Filled 2019-08-14 (×12): qty 7.5

## 2019-08-14 MED ORDER — LORAZEPAM 2 MG/ML IJ SOLN
0.5000 mg | Freq: Once | INTRAMUSCULAR | Status: AC | PRN
  Administered 2019-08-14: 0.5 mg via INTRAVENOUS
  Filled 2019-08-14: qty 1

## 2019-08-14 NOTE — Procedures (Signed)
Patient Name: Andres [name]  MRN: [id number]  Epilepsy Attending: Lora Havens  Referring Physician/Provider: Dr Roland Rack Date: 08/13/2018 Duration: 25.56 mins  Patient history: 26 M with history of seziure who presented with stroke with and frequent left-sided twitching concerning for seizure. EEG to evaluate for seizure.  Level of alertness: asleep/sedated  AEDs during EEG study: LEV  Technical aspects: This EEG study was done with scalp electrodes positioned according to the 10-20 International system of electrode placement. Electrical activity was acquired at a sampling rate of 500Hz  and reviewed with a high frequency filter of 70Hz  and a low frequency filter of 1Hz . EEG data were recorded continuously and digitally stored.   DESCRIPTION: EEG showed continuous generalized and lateralized right hemisphere low amplitude 3-5hz  theta-delta slowing. Hyperventilation and photic stimulation were not performed.  ABNORMALITY - Continuous slow, generalized and lateralized right hemisphere  IMPRESSION: This study is suggestive of cortical dysfunction in right hemisphere likely secondary to underlying infarct as well as severe diffuse encephalopathy, non specific to etiology. No seizures or epileptiform discharges were seen throughout the recording.  Andres [name] Andres [name]

## 2019-08-14 NOTE — Progress Notes (Signed)
LTM EEG hooked up and running - no initial skin breakdown - push button tested - neuro notified.  

## 2019-08-14 NOTE — Progress Notes (Signed)
MEWS Guidelines - (patients age 81 and over)  Red - At High Risk for Deterioration Yellow - At risk for Deterioration  1. Go to room and assess patient 2. Validate data. Is this patient's baseline? If data confirmed: 3. Is this an acute change? 4. Administer prn meds/treatments as ordered. 5. Note Sepsis score 6. Review goals of care 7. Sports coach, RRT nurse and Provider. 8. Ask Provider to come to bedside.  9. Document patient condition/interventions/response. 10. Increase frequency of vital signs and focused assessments to at least q15 minutes x 4, then q30 minutes x2. - If stable, then q1h x3, then q4h x3 and then q8h or dept. routine. - If unstable, contact Provider & RRT nurse. Prepare for possible transfer. 11. Add entry in progress notes using the smart phrase ".MEWS". 1. Go to room and assess patient 2. Validate data. Is this patient's baseline? If data confirmed: 3. Is this an acute change? 4. Administer prn meds/treatments as ordered? 5. Note Sepsis score 6. Review goals of care 7. Sports coach and Provider 8. Call RRT nurse as needed. 9. Document patient condition/interventions/response. 10. Increase frequency of vital signs and focused assessments to at least q2h x2. - If stable, then q4h x2 and then q8h or dept. routine. - If unstable, contact Provider & RRT nurse. Prepare for possible transfer. 11. Add entry in progress notes using the smart phrase ".MEWS".  Green - Likely stable Lavender - Comfort Care Only  1. Continue routine/ordered monitoring.  2. Review goals of care. 1. Continue routine/ordered monitoring. 2. Review goals of care.   Both Neuro MD and [facility] have been following patient's care due to changes in status.  Patient received one time dose of Ativan for CT scan which was not effective for scan but subsequently effective afterwards making patient more drowsy and less alert. He does respond to pain at this time.  Staff  will continue to monitor closely.

## 2019-08-14 NOTE — Progress Notes (Signed)
Pt has been having intermittent rythmic  tremor like activity of left arm and L leg lasting about 30 secs to 1 min throughtout this shift. Due AED  Given with no change with the left arm tremors..[facility]  On call  As neurology on call made aware. New orders given  and being carried.out. RNs will continue to monitor pt closely.

## 2019-08-14 NOTE — Progress Notes (Signed)
Pt lethargic. Arousable to voice. Intermittently answers to name and opens eyes. Squeezes with right hand, lifts arm. Left leg intermittently moving. O2 100% 2L . VS stable. Will continue to monitor.

## 2019-08-14 NOTE — Progress Notes (Signed)
Subjective: Called by nurse regarding frequent left-sided twitching most consistent with seizure activity.  Following the twitching, he will immediately " fall asleep" for a few minutes and then arouse.  Exam: Vitals:   08/13/19 2058 08/14/19 0020  BP: (!) 148/78 128/90  Pulse: 86 81  Resp: 18 20  Temp: (!) 97.4 F (36.3 C) 99 F (37.2 C)  SpO2: 99% 98%   Gen: In bed, NAD Resp: non-labored breathing, no acute distress Abd: soft, nt  Neuro: MS: Awake, interactive, following simple commands and answering simple questions. CN: Fixates and tracks, left  facial weakness Motor: He is moving his left arm and leg in a slightly agitated fashion with some weakness, but no clonic activity at this time. Moves right side well    Pertinent Labs: bmet from 01/08 cr 1.24  Impression: 63 M with history of seziure swho presented with stroke with recurrent seizure like episodes. I suspect they are seizures and wil give additional keppra x 1.   Recommendations: 1)Keppra 1 g x 1 2) Increase dose to 750mg  BID 3) Given ativan for additional seizures 4) CT head  5) EEG in the AM  6) BMP, Mg  Roland Rack, MD [facility] [phone]  If 7pm- 7am, please page neurology on call as listed in [place].

## 2019-08-14 NOTE — Progress Notes (Signed)
PROGRESS NOTE    Andres [name]  [id number] DOB: 1941-02-24 DOA: 08/09/2019 PCP: [name], MD   Brief Narrative: Andres [name] is a 79 y.o. male with a history of hypertension, hyperlipidemia, CAD status post CABG, carotid artery stenosis, CVA, mitral valve prolapse, diabetes mellitus type 2, BPH, hepatitis, GERD.  Patient presented secondary to being found down with associated left-sided weakness and dysarthria and found to have an acute right MCA stroke.  Patient with significant dysphagia secondary to stroke requiring alternate means of nutrition at this time.   Assessment & Plan:   Principal Problem:   Acute ischemic stroke [facility]) Active Problems:   Hyperlipidemia   Essential hypertension   BPH (benign prostatic hyperplasia)   CAD (coronary artery disease), hx of CABG 1997 X 6. Last nuc 2012 negative.   Morbid obesity ([place])   Transaminitis   History of seizures   Acute right MCA stroke Patient with left sided weakness and dysarthria. MRI significant for large area of acute infarct of right MCA territory. LDL of 100. Hemoglobin A1C of 6.2%. Transthoracic Echocardiogram significant for no evidence of embolic source. PT/OT recommendations pending.significant dysphagia and left hemiplegia. Speech therapy recommending n.p.o and tube feeds. Discussed need for NG tube with patient and he understands. NG tube placed on 08/12/2019. Concern for worsening stroke symptoms on 1/10 and rapid response called. -Cortrak NG tube ordered; dietitian for tube feeds -Neurology recommendations: Dual antiplatelet therapy for 3 months, followed by Plavix alone -PT/OT recommendations: CIR  Delirium Unknown etiology. Patient given ativan with adverse effect. Delirium improved. Mental status improved as a whole.  History of seizures Patient is on Keppra 500 mg BID as an outpatient. EEG obtained with no evidence of seizures. Having feft sided tremors. Concern for subclinical seizures. Patient  underwent CT scan which was significant for progression of right MCA territory infarct. -Neurology recommendations: continuous EEG, neurochecks   Lethargy Secondary to ativan. Concern this could also be post-ictal state vs related to worsening of underlying stroke. ABG reassuring.  Fever Mild. Change in mental status possibly related but unsure. Normal WBC. -Blood/urine cultures, chest x-ray  Dysphagia In setting of acute stroke.  Speech therapy recommending alternate means of nutrition and n.p.o.  As patient has failed her swallow study. Cortrak placed 1/8 and tube feeds initiated. -Tube feeds  Essential hypertension Mildly uncontrolled.  Patient is on HCTZ, losartan, metoprolol as an outpatient.  These were held on admission secondary to acute stroke. -Metoprolol -Add back losartan and HCTZ as needed  Diabetes mellitus, type II Controlled.  Hemoglobin A1c of 6.2%.  Patient is not on outpatient regimen currently.  CAD Patient has a history of CABG in 1997.  Patient is on Lipitor, aspirin, Plavix, omega-3 fatty acids as an outpatient. -Continue aspirin, Plavix, Lipitor  CKD stage III  Stable  Elevated AST Minimally elevated  BPH -Continue Flomax  Hyperlipidemia LDL 62.  Patient is on Lipitor and omega-3 fatty acid as an outpatient. -Continue Lipitor 40 mg daily  Morbid obesity Body mass index is 35.88 kg/m.   DVT prophylaxis: SCDs Code Status:   Code Status: Full Code Family Communication: Daughter via telephone, no response Disposition Plan: Discharge per therapy recommendations   Consultants:   Neurology  Procedures:   08/09/2019: Transthoracic Echocardiogram IMPRESSIONS    1. Left ventricular ejection fraction, by visual estimation, is 60%. The left ventricle has normal function. There is no left ventricular hypertrophy.  2. Abnormal septal motion secondary to conduction delay.  3. Elevated left ventricular  end-diastolic pressure.  4. Left ventricular  diastolic parameters are consistent with Grade II diastolic dysfunction (pseudonormalization).  5. Global right ventricle has normal systolic function.The right ventricular size is normal. No increase in right ventricular wall thickness.  6. Left atrial size was normal.  7. Right atrial size was normal.  8. The mitral valve is abnormal. Trivial mitral valve regurgitation. No evidence of mitral stenosis.  9. Mild bileaflet mitral valve prolapse. 10. The tricuspid valve is normal in structure. 11. The aortic valve is normal in structure. Aortic valve regurgitation is not visualized. No evidence of aortic valve sclerosis or stenosis. 12. The pulmonic valve was normal in structure. Pulmonic valve regurgitation is trivial. 13. The inferior vena cava is normal in size with greater than 50% respiratory variability, suggesting right atrial pressure of 3 mmHg. 14. The interatrial septum was not well visualized. 15. No intracardiac source of emboli noted.   1/5: EEG DESCRIPTION: No clear posterior dominant rhythm was seen. EEG showed continuous generalized and lateralized right hemisphere 3-6hz  theta-delta slowing. Hyperventilation and photic stimulation were not performed.  ABNORMALITY - Continuous slow, generalized and lateralized right hemisphere  IMPRESSION: This study is suggestive of cortical dysfunction in right hemisphere consistent with underlying infarct. Additionally, there is evidence of moderate diffuse encephalopathy. No seizures or epileptiform discharges were seen throughout the recording.  Antimicrobials:  None    Subjective: No issues noted  Objective: Vitals:   08/14/19 0811 08/14/19 0850 08/14/19 0852 08/14/19 0919  BP:   127/73 (!) 134/51  Pulse:   88 83  Resp:   (!) 28   Temp:  (!) 100.8 F (38.2 C)  99.6 F (37.6 C)  TempSrc:  Rectal  Oral  SpO2: 100%  100% 99%  Weight:      Height:        Intake/Output Summary (Last 24 hours) at 08/14/2019 1010 Last data  filed at 08/14/2019 0730 Gross per 24 hour  Intake 720 ml  Output 1025 ml  Net -305 ml   Filed Weights   08/09/19 0153 08/10/19 2006 08/14/19 0215  Weight: 111.2 kg 104.2 kg 103.9 kg    Examination:  General exam: Appears calm and comfortable Respiratory system: Rhonchi, stridor present when asleep but clears when patient is aroused. Mild accessory muscle usage Cardiovascular system: S1 & S2 heard, RRR. No murmurs, rubs, gallops or clicks. Gastrointestinal system: Abdomen is nondistended, soft and nontender. No organomegaly or masses felt. Normal bowel sounds heard. Central nervous system: Lethargic but arouses easily. Will follow minimal commands. Moves extremities spontaneously. Extremities: No edema. No calf tenderness Skin: No cyanosis. No rashes Psychiatry: Judgement and insight appear impaired. Blunt affect    Data Reviewed: I have personally reviewed following labs and imaging studies  CBC: Recent Labs  Lab 08/09/19 0254 08/09/19 0703 08/10/19 0316 08/14/19 0932  WBC  --  8.8 7.0 7.2  NEUTROABS  --  6.3  --   --   HGB 13.3 13.8 12.5* 11.2*  HCT 39.0 40.2 36.0*  36.5* 33.1*  MCV  --  100.5* 99.4 100.0  PLT  --  124* 174 [id number]   Basic Metabolic Panel: Recent Labs  Lab 08/09/19 0254 08/09/19 0703 08/12/19 0505 08/14/19 0428  NA 137 141 138 139  K 5.7* 4.9 4.3 4.2  CL 103 103 102 104  CO2  --  27 20* 24  GLUCOSE 127* 111* 89 180*  BUN 30* 21 12 17   CREATININE 1.30* 1.35* 1.24 1.11  CALCIUM  --  9.8  9.1 9.0  MG  --   --   --  1.7   GFR: Estimated Creatinine Clearance: 61 mL/min (by C-G formula based on SCr of 1.11 mg/dL). Liver Function Tests: Recent Labs  Lab 08/09/19 0703  AST 45*  ALT 26  ALKPHOS 70  BILITOT 0.4  PROT 6.8  ALBUMIN 4.1   No results for input(s): LIPASE, AMYLASE in the last 168 hours. No results for input(s): AMMONIA in the last 168 hours. Coagulation Profile: Recent Labs  Lab 08/09/19 0243 08/09/19 0703 08/09/19 0920    INR 1.1 1.1 1.0   Cardiac Enzymes: No results for input(s): CKTOTAL, CKMB, CKMBINDEX, TROPONINI in the last 168 hours. BNP (last 3 results) No results for input(s): PROBNP in the last 8760 hours. HbA1C: No results for input(s): HGBA1C in the last 72 hours. CBG: Recent Labs  Lab 08/13/19 1952 08/14/19 0001 08/14/19 0338 08/14/19 0730 08/14/19 0856  GLUCAP 152* 138* 135* 133* 175*   Lipid Profile: No results for input(s): CHOL, HDL, LDLCALC, TRIG, CHOLHDL, LDLDIRECT in the last 72 hours. Thyroid Function Tests: No results for input(s): TSH, T4TOTAL, FREET4, T3FREE, THYROIDAB in the last 72 hours. Anemia Panel: No results for input(s): VITAMINB12, FOLATE, FERRITIN, TIBC, IRON, RETICCTPCT in the last 72 hours. Sepsis Labs: No results for input(s): PROCALCITON, LATICACIDVEN in the last 168 hours.  Recent Results (from the past 240 hour(s))  SARS CORONAVIRUS 2 (TAT 6-24 HRS) Nasopharyngeal Nasopharyngeal Swab     Status: None   Collection Time: 08/09/19 11:30 AM   Specimen: Nasopharyngeal Swab  Result Value Ref Range Status   SARS Coronavirus 2 NEGATIVE NEGATIVE Final    Comment: (NOTE) SARS-CoV-2 target nucleic acids are NOT DETECTED. The SARS-CoV-2 RNA is generally detectable in upper and lower respiratory specimens during the acute phase of infection. Negative results do not preclude SARS-CoV-2 infection, do not rule out co-infections with other pathogens, and should not be used as the sole basis for treatment or other patient management decisions. Negative results must be combined with clinical observations, patient history, and epidemiological information. The expected result is Negative. Fact Sheet for Patients: SugarRoll.be Fact Sheet for Healthcare Providers: https://www.woods-mathews.com/ This test is not yet approved or cleared by the [place] FDA and  has been authorized for detection and/or diagnosis of SARS-CoV-2  by FDA under an Emergency Use Authorization (EUA). This EUA will remain  in effect (meaning this test can be used) for the duration of the COVID-19 declaration under Section 56 4(b)(1) of the Act, 21 U.S.C. section 360bbb-3(b)(1), unless the authorization is terminated or revoked sooner. Performed at [facility], [place] [address] [place], [place] [address]          Radiology Studies: CT HEAD WO CONTRAST  Result Date: 08/14/2019 CLINICAL DATA:  Stroke follow-up. Worsening stroke symptoms. EXAM: CT HEAD WITHOUT CONTRAST TECHNIQUE: Contiguous axial images were obtained from the base of the skull through the vertex without intravenous contrast. COMPARISON:  08/09/2019 FINDINGS: Brain: Slight expansion of right MCA territory infarct, now involving more of the insula and frontal operculum. There is white matter hypoattenuation consistent with chronic ischemic microangiopathy. No hemorrhage. No herniation or other significant mass effect. No hydrocephalus. Vascular: Atherosclerotic calcification of the internal carotid arteries at the skull base. No abnormal hyperdensity of the major intracranial arteries or dural venous sinuses. Skull: The visualized skull base, calvarium and extracranial soft tissues are normal. Sinuses/Orbits: No fluid levels or advanced mucosal thickening of the visualized paranasal sinuses. No mastoid or middle  ear effusion. The orbits are normal. Other: None. IMPRESSION: Progression of right MCA territory infarct, extending further into the insula and frontal operculum without hemorrhage or herniation. Electronically Signed   By: Ulyses Jarred M.D.   On: 08/14/2019 02:16   DG CHEST PORT 1 VIEW  Result Date: 08/14/2019 CLINICAL DATA:  Fever. Shortness of breath. EXAM: PORTABLE CHEST 1 VIEW COMPARISON:  08/09/2019 and 09/05/2018 FINDINGS: Feeding tube tip is in the region of the duodenal bulb. Heart size and pulmonary vascularity are normal. Minimal residual atelectasis at  the left base medially. The lungs are otherwise clear. CABG. No bone abnormality. IMPRESSION: 1. Minimal residual atelectasis at the left lung base. Lung aeration has improved bilaterally. 2. Feeding tube tip is in the duodenal bulb. Electronically Signed   By: Lorriane Shire M.D.   On: 08/14/2019 10:06        Scheduled Meds: .  stroke: mapping our early stages of recovery book   Does not apply Once  . aspirin  300 mg Rectal Daily   Or  . aspirin  81 mg Oral Daily  . atorvastatin  40 mg Oral Daily  . clopidogrel  75 mg Oral Daily  . feeding supplement (PRO-STAT SUGAR FREE 64)  30 mL Per Tube Daily  . metoprolol tartrate  50 mg Per Tube BID  . tamsulosin  0.4 mg Oral Daily   Continuous Infusions: . feeding supplement (JEVITY 1.2 CAL) 1,000 mL (08/13/19 2347)  . levETIRAcetam 750 mg (08/14/19 0910)     LOS: 5 days     Cordelia Poche, MD [facility] 08/14/2019, 10:10 AM  If 7PM-7AM, please contact night-coverage [email or web address]

## 2019-08-14 NOTE — Progress Notes (Signed)
EEG complete - results pending 

## 2019-08-14 NOTE — Progress Notes (Signed)
Ct reviewed, though slightly more visible than on previous CT, when compared to MRI, I do not think that there has been any significant change other than expected edema/evolution. No changes from a stroke perspective at this time.   Roland Rack, MD [facility] [phone]  If 7pm- 7am, please page neurology on call as listed in [place].

## 2019-08-14 NOTE — Progress Notes (Signed)
Stat CT done and neuro as well as [facility] MD on call made are of the result. No new orders given. RN will continue with neuro check and close monitoring.

## 2019-08-14 NOTE — Progress Notes (Signed)
Patient is back on unit from CT.

## 2019-08-14 NOTE — Progress Notes (Addendum)
NEURO HOSPITALIST PROGRESS NOTE   Subjective: Patient was snoring not following commands, rapid response nurse at bedside. EEG today. Keppra was increased to 750 mg BID overnight. CTH done overnight " CT, when compared to MRI, I do not think that there has been any significant change other than expected edema/evolution. No changes from a stroke perspective at this time. per Dr. Cecil Cobbs note"  Exam: Vitals:   08/14/19 0852 08/14/19 0919  BP: 127/73 (!) 134/51  Pulse: 88 83  Resp: (!) 28   Temp:  99.6 F (37.6 C)  SpO2: 100% 99%    Physical Exam  Constitutional: Appears well-developed and well-nourished.  Psych: Affect appropriate to situation Eyes: Normal external eye and conjunctiva. HENT: Normocephalic, no lesions, without obvious abnormality.   Musculoskeletal-no joint tenderness, deformity or swelling Cardiovascular: Normal rate and regular rhythm.  Respiratory: Effort normal, non-labored breathing saturations WNL on 2L [place] GI: round, Soft.  No distension. There is no tenderness.  Skin: WDI   Neuro:  Mental Status: Snoring, obtunded, not following  Commands. Moans to sternal rub and noxious. Head preference to right side. Does not respond to verbal stimuli. Cranial Nerves:   Roving eye movements noted, PERRL, pupils 73mm bilaterally Motor/ sensory: Able to moan and localize in all 4 extremities to noxious, Rught UE and RLE> 4/5 strength, LUE and LLE 2/5 Cerebellar: UTA Gait: UTA    Medications:  Scheduled: .  stroke: mapping our early stages of recovery book   Does not apply Once  . aspirin  300 mg Rectal Daily   Or  . aspirin  81 mg Oral Daily  . atorvastatin  40 mg Oral Daily  . clopidogrel  75 mg Oral Daily  . feeding supplement (PRO-STAT SUGAR FREE 64)  30 mL Per Tube Daily  . metoprolol tartrate  50 mg Per Tube BID  . tamsulosin  0.4 mg Oral Daily   Continuous: . feeding supplement (JEVITY 1.2 CAL) 1,000 mL (08/13/19 2347)  .  levETIRAcetam 750 mg (08/14/19 0910)   [id number] **OR** acetaminophen (TYLENOL) oral liquid 160 mg/5 mL **OR** acetaminophen, diclofenac sodium, polyethylene glycol  Pertinent Labs/Diagnostics: BMP: BG: 180 Mg: WNL  CT HEAD WO CONTRAST  Result Date: 08/14/2019 CLINICAL DATA:  Stroke follow-up. Worsening stroke symptoms. EXAM: CT HEAD WITHOUT CONTRAST TECHNIQUE: Contiguous axial images were obtained from the base of the skull through the vertex without intravenous contrast. COMPARISON:  08/09/2019 FINDINGS: Brain: Slight expansion of right MCA territory infarct, now involving more of the insula and frontal operculum. There is white matter hypoattenuation consistent with chronic ischemic microangiopathy. No hemorrhage. No herniation or other significant mass effect. No hydrocephalus. Vascular: Atherosclerotic calcification of the internal carotid arteries at the skull base. No abnormal hyperdensity of the major intracranial arteries or dural venous sinuses. Skull: The visualized skull base, calvarium and extracranial soft tissues are normal. Sinuses/Orbits: No fluid levels or advanced mucosal thickening of the visualized paranasal sinuses. No mastoid or middle ear effusion. The orbits are normal. Other: None. IMPRESSION: Progression of right MCA territory infarct, extending further into the insula and frontal operculum without hemorrhage or herniation. Electronically Signed   By: Ulyses Jarred M.D.   On: 08/14/2019 02:16   DG CHEST PORT 1 VIEW  Result Date: 08/14/2019 CLINICAL DATA:  Fever. Shortness of breath. EXAM: PORTABLE CHEST 1 VIEW COMPARISON:  08/09/2019 and 09/05/2018 FINDINGS: Feeding tube tip is in  the region of the duodenal bulb. Heart size and pulmonary vascularity are normal. Minimal residual atelectasis at the left base medially. The lungs are otherwise clear. CABG. No bone abnormality. IMPRESSION: 1. Minimal residual atelectasis at the left lung base. Lung aeration has improved  bilaterally. 2. Feeding tube tip is in the duodenal bulb. Electronically Signed   By: Lorriane Shire M.D.   On: 08/14/2019 10:06   Assessment:  84 M with history of seziure swho presented with stroke with recurrent seizure like episodes.  Was consulted overnight for seizure-like activity, received 1 g of Keppra and Keppra dose was increased to 750 twice daily.  Rapid response was called as patient has been lethargic since returning from CT head, had received 1 mg of Ativan as well as 1 g of Keppra.  Also noted to have fever of 100 F  On assessment patient is lethargic, not following any commands.  He is snoring, will squeeze with his right hand, moving all 4 extremities, R >L with gaze deviation to the right.  Seizures Prolonged post ictal state Large right MCA infarction   Recommendations: -- continue Keppra 750mg  BID --call for any additional seizures -- stat EEG  r/o non convulsive status epilepticus, will obtain continuous EEG --seizure precautions - CXR  - frequent neurochecks    Laurey Morale, MSN, NP-C [facility] [phone]  Attending neurologist's note to follow  08/14/2019, 10:11 AM   NEUROHOSPITALIST ADDENDUM Performed a face to face diagnostic evaluation.   I have reviewed the contents of history and physical exam as documented by PA/ARNP/Resident and agree with above documentation.  I have discussed and formulated the above plan as documented. Edits to the note have been made as needed.  Patient was reconsulted for seizures, patient is obtunded this morning.  Most noxious stimulus, but is nonverbal and not following any commands.  Is possible patient is obtunded due to postictal state, use Ativan and Keppra as well as possible aspiration/sepsis. Stat EEG obtained, cortical dysfunction in the right hemisphere no seizures or epileptiform discharges, thereby ruling out nonconvulsive status epilepticus.   EEG for 24 hours to rule out subclinical  seizures. Lower possibility includes expansion of stroke, may consider repeating MRI brain if patient does not return to baseline.   Patient has mild elevated temperature, will defer to work-up, possibly patient aspirated.   CRITICAL CARE Performed by: Lanice Schwab Marik [place]   Total critical care time: 35 minutes  Critical care time was exclusive of separately billable procedures and treating other patients.  Critical care was necessary to treat or prevent imminent or life-threatening deterioration.  Critical care was time spent personally by me on the following activities: development of treatment plan with patient and/or surrogate as well as nursing, discussions with consultants, evaluation of patient's response to treatment, examination of patient, obtaining history from patient or surrogate, ordering and performing treatments and interventions, ordering and review of laboratory studies, ordering and review of radiographic studies, pulse oximetry and re-evaluation of patient's condition.    Karena Addison Geralynn Capri MD [facility] [phone]   If 7pm to 7am, please call on call as listed on AMION.

## 2019-08-14 NOTE — Significant Event (Signed)
Rapid Response Event Note  Overview: Time Called: 0824 Arrival Time: 0827 Event Type: Neurologic  Initial Focused Assessment: Patient unresponsive since Ativan given about 1am.  Prior to was able to answer questions and follow commands.  Initially O2 sats 82% on RA, improved to 100% on 2L [place] He has snoring respiratons He responds to noxious stimuli, right more than left.  BP 127/71  SR 88  RR 28  O2 sat100% on 2L Rectal temp 100.8 Lung sounds clear, decreased bases CBG 175  Interventions: ABG PCXR Neurology at bedside to assess patient Plan: EEG   Plan of Care (if not transferred):  Event Summary: Name of Physician Notified: Dr Lonny Prude at [phone]  Name of Consulting Physician Notified: Dr Lorraine Lax at [employer]        Raliegh Ip

## 2019-08-15 ENCOUNTER — Inpatient Hospital Stay (HOSPITAL_COMMUNITY): Payer: Medicare (Managed Care)

## 2019-08-15 DIAGNOSIS — R7881 Bacteremia: Secondary | ICD-10-CM | POA: Diagnosis present

## 2019-08-15 DIAGNOSIS — B957 Other staphylococcus as the cause of diseases classified elsewhere: Secondary | ICD-10-CM | POA: Diagnosis present

## 2019-08-15 DIAGNOSIS — G40009 Localization-related (focal) (partial) idiopathic epilepsy and epileptic syndromes with seizures of localized onset, not intractable, without status epilepticus: Secondary | ICD-10-CM

## 2019-08-15 DIAGNOSIS — G40909 Epilepsy, unspecified, not intractable, without status epilepticus: Secondary | ICD-10-CM

## 2019-08-15 LAB — GLUCOSE, CAPILLARY
Glucose-Capillary: 138 mg/dL — ABNORMAL HIGH (ref 70–99)
Glucose-Capillary: 147 mg/dL — ABNORMAL HIGH (ref 70–99)
Glucose-Capillary: 126 mg/dL — ABNORMAL HIGH (ref 70–99)
Glucose-Capillary: 127 mg/dL — ABNORMAL HIGH (ref 70–99)
Glucose-Capillary: 107 mg/dL — ABNORMAL HIGH (ref 70–99)
Glucose-Capillary: 79 mg/dL (ref 70–99)

## 2019-08-15 LAB — BLOOD CULTURE ID PANEL (REFLEXED)

## 2019-08-15 LAB — COMPREHENSIVE METABOLIC PANEL
ALT: 30 U/L (ref 0–44)
AST: 37 U/L (ref 15–41)
Albumin: 2.6 g/dL — ABNORMAL LOW (ref 3.5–5.0)
Alkaline Phosphatase: 61 U/L (ref 38–126)
Anion gap: 7 (ref 5–15)
BUN: 16 mg/dL (ref 8–23)
CO2: 30 mmol/L (ref 22–32)
Calcium: 9.1 mg/dL (ref 8.9–10.3)
Chloride: 105 mmol/L (ref 98–111)
Creatinine, Ser: 1.15 mg/dL (ref 0.61–1.24)
GFR calc Af Amer: >60 mL/min (ref >60)
GFR calc non Af Amer: 60 mL/min — ABNORMAL LOW (ref >60)
Glucose, Bld: 156 mg/dL — ABNORMAL HIGH (ref 70–99)
Potassium: 4.1 mmol/L (ref 3.5–5.1)
Sodium: 142 mmol/L (ref 135–145)
Total Bilirubin: 0.4 mg/dL (ref 0.3–1.2)
Total Protein: 6 g/dL — ABNORMAL LOW (ref 6.5–8.1)

## 2019-08-15 LAB — CBC
HCT: 32.9 % — ABNORMAL LOW (ref 39.0–52.0)
Hemoglobin: 10.9 g/dL — ABNORMAL LOW (ref 13.0–17.0)
MCH: 33.6 pg (ref 26.0–34.0)
MCHC: 33.1 g/dL (ref 30.0–36.0)
MCV: 101.5 fL — ABNORMAL HIGH (ref 80.0–100.0)
Platelets: 214 10*3/uL (ref 150–400)
RBC: 3.24 MIL/uL — ABNORMAL LOW (ref 4.22–5.81)
RDW: 12.5 % (ref 11.5–15.5)
WBC: 6.6 10*3/uL (ref 4.0–10.5)
nRBC: 0 % (ref 0.0–0.2)

## 2019-08-15 LAB — TSH: TSH: 1.076 u[IU]/mL (ref 0.350–4.500)

## 2019-08-15 LAB — URINE CULTURE

## 2019-08-15 LAB — AMMONIA: Ammonia: 19 umol/L (ref 9–35)

## 2019-08-15 MED ORDER — SODIUM BICARBONATE 650 MG PO TABS
650.0000 mg | ORAL_TABLET | Freq: Once | ORAL | Status: AC
  Administered 2019-08-15: 14:00:00 650 mg
  Filled 2019-08-15 (×2): qty 1

## 2019-08-15 MED ORDER — ATORVASTATIN CALCIUM 40 MG PO TABS
40.0000 mg | ORAL_TABLET | Freq: Every day | ORAL | Status: DC
  Administered 2019-08-17 – 2019-08-19 (×3): 40 mg
  Filled 2019-08-15 (×3): qty 1

## 2019-08-15 MED ORDER — ASPIRIN 81 MG PO CHEW
81.0000 mg | CHEWABLE_TABLET | Freq: Every day | ORAL | Status: DC
  Administered 2019-08-17 – 2019-08-19 (×3): 81 mg
  Filled 2019-08-15 (×3): qty 1

## 2019-08-15 MED ORDER — LORAZEPAM 2 MG/ML IJ SOLN: 1.0000 mg | INTRAMUSCULAR | Status: DC | PRN

## 2019-08-15 MED ORDER — CEFAZOLIN SODIUM-DEXTROSE 2-4 GM/100ML-% IV SOLN
2.0000 g | Freq: Three times a day (TID) | INTRAVENOUS | Status: DC
  Administered 2019-08-15 – 2019-08-19 (×13): 2 g via INTRAVENOUS
  Filled 2019-08-15 (×19): qty 100

## 2019-08-15 MED ORDER — ASPIRIN 300 MG RE SUPP
300.0000 mg | Freq: Every day | RECTAL | Status: DC
  Administered 2019-08-16: 300 mg via RECTAL
  Filled 2019-08-15: qty 1

## 2019-08-15 MED ORDER — PANCRELIPASE (LIP-PROT-AMYL) 10440-39150 UNITS PO TABS
20880.0000 [IU] | ORAL_TABLET | Freq: Once | ORAL | Status: AC
  Administered 2019-08-15: 20880 [IU]
  Filled 2019-08-15 (×2): qty 2

## 2019-08-15 MED ORDER — METOPROLOL TARTRATE 5 MG/5ML IV SOLN
5.0000 mg | Freq: Four times a day (QID) | INTRAVENOUS | Status: DC
  Administered 2019-08-16 – 2019-08-17 (×7): 5 mg via INTRAVENOUS
  Filled 2019-08-15 (×7): qty 5

## 2019-08-15 MED ORDER — CLOPIDOGREL BISULFATE 75 MG PO TABS
75.0000 mg | ORAL_TABLET | Freq: Every day | ORAL | Status: DC
  Administered 2019-08-17 – 2019-08-19 (×3): 75 mg
  Filled 2019-08-15 (×3): qty 1

## 2019-08-15 NOTE — Progress Notes (Signed)
PHARMACY - PHYSICIAN COMMUNICATION CRITICAL VALUE ALERT - BLOOD CULTURE IDENTIFICATION (BCID)  Results for orders placed or performed during the hospital encounter of 08/09/19  Blood Culture ID Panel (Reflexed) (Collected: 08/14/2019  9:32 AM)  Result Value Ref Range   Enterococcus species NOT DETECTED NOT DETECTED   Listeria monocytogenes NOT DETECTED NOT DETECTED   Staphylococcus species DETECTED (A) NOT DETECTED   Staphylococcus aureus (BCID) NOT DETECTED NOT DETECTED   Methicillin resistance NOT DETECTED NOT DETECTED   Streptococcus species NOT DETECTED NOT DETECTED   Streptococcus agalactiae NOT DETECTED NOT DETECTED   Streptococcus pneumoniae NOT DETECTED NOT DETECTED   Streptococcus pyogenes NOT DETECTED NOT DETECTED   Acinetobacter baumannii NOT DETECTED NOT DETECTED   Enterobacteriaceae species NOT DETECTED NOT DETECTED   Enterobacter cloacae complex NOT DETECTED NOT DETECTED   Escherichia coli NOT DETECTED NOT DETECTED   Klebsiella oxytoca NOT DETECTED NOT DETECTED   Klebsiella pneumoniae NOT DETECTED NOT DETECTED   Proteus species NOT DETECTED NOT DETECTED   Serratia marcescens NOT DETECTED NOT DETECTED   Haemophilus influenzae NOT DETECTED NOT DETECTED   Neisseria meningitidis NOT DETECTED NOT DETECTED   Pseudomonas aeruginosa NOT DETECTED NOT DETECTED   Candida albicans NOT DETECTED NOT DETECTED   Candida glabrata NOT DETECTED NOT DETECTED   Candida krusei NOT DETECTED NOT DETECTED   Candida parapsilosis NOT DETECTED NOT DETECTED   Candida tropicalis NOT DETECTED NOT DETECTED    Name of physician (or Provider) ContactedSharlet Salina with [facility] in 2/2 blood cultures with Staph species, MecA negative  Changes to prescribed antibiotics required: Start Ancef   Adrean Heitz S. Alford [place], PharmD, BCPS Clinical Staff Pharmacist [email or web address] [place] 08/15/2019  6:35 AM

## 2019-08-15 NOTE — Progress Notes (Signed)
Inpatient Rehabilitation Admissions Coordinator  I met with daughter at bedside and discussed insurance coverage for a possible inpt rehab admit pending insurance approval.   Danne Baxter, RN, MSN Rehab Admissions Coordinator [phone] 08/15/2019 12:26 PM

## 2019-08-15 NOTE — Progress Notes (Signed)
Patient ID: Andres [name], male   DOB: Jun 15, 1941, 79 y.o.   MRN: [id number]         [facility]    Date of Admission:  08/09/2019           Day 1 cefazolin       Reason for Consult: Positive blood cultures    Referring Provider: Dr. Cordelia Poche  Assessment: He may have had true bacteremia with coag negative staph but these could represent to contaminated sets of blood cultures.  I asked our lab to do full susceptibility testing on both isolates to see if they are same or different.  I agree with continuing cefazolin for now.  I would hold off on TEE for now.  Plan: 1. Continue cefazolin 2. Repeat blood cultures 3. Await results of antibiotic susceptibility testing  Principal Problem:   Acute ischemic stroke ([place]) Active Problems:   Positive blood cultures   Hyperlipidemia   Essential hypertension   BPH (benign prostatic hyperplasia)   CAD (coronary artery disease), hx of CABG 1997 X 6. Last nuc 2012 negative.   Morbid obesity ([place])   Transaminitis   History of seizures   Scheduled Meds: .  stroke: mapping our early stages of recovery book   Does not apply Once  . [START ON 08/16/2019] aspirin  81 mg Per Tube Daily   Or  . [START ON 08/16/2019] aspirin  300 mg Rectal Daily  . [START ON 08/16/2019] atorvastatin  40 mg Per Tube Daily  . [START ON 08/16/2019] clopidogrel  75 mg Per Tube Daily  . feeding supplement (PRO-STAT SUGAR FREE 64)  30 mL Per Tube Daily  . metoprolol tartrate  50 mg Per Tube BID  . tamsulosin  0.4 mg Oral Daily   Continuous Infusions: .  ceFAZolin (ANCEF) IV 2 g (08/15/19 0755)  . feeding supplement (JEVITY 1.2 CAL) 1,000 mL (08/14/19 1810)  . levETIRAcetam 750 mg (08/15/19 1116)   PRN Meds:.acetaminophen **OR** acetaminophen (TYLENOL) oral liquid 160 mg/5 mL **OR** acetaminophen, diclofenac sodium, LORazepam, polyethylene glycol  HPI: Andres [name] is a 79 y.o. male with a history of previous right-sided stroke and  multiple other medical problems.  By report, he had no residual deficits from his previous stroke.  He was found down on the floor with left-sided weakness and garbled speech by his son on 08/09/2019 leading to admission.  Brain MRI showed an acute right MCA CVA surrounding his chronic right parietal infarct.  He has had some seizure activity.  Yesterday he had a temperature up to 100.8 degrees.  Blood cultures were obtained and both sets are growing coag negative staph.  The BCID did not detect methicillin resistance.  He underwent TTE on 08/09/2019 because of his stroke and no vegetations were seen.  He has been started on cefazolin.  Review of Systems: Review of Systems  Unable to perform ROS: Mental acuity    Past Medical History:  Diagnosis Date  . ALLERGIC RHINITIS   . Benign prostatic hypertrophy   . 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 Last nuc 2012 negative. 07/21/2013  . Carpal tunnel syndrome, bilateral   . Cataracts, bilateral   . Coronary artery disease    Dr. Claiborne Billings; 2D ECHO, 12/11/2011 - EF 50-55%, normal; NUCLEAR STRESS TEST, 10/16/2010 - no evidemce of inducible ischemia  . Diabetes mellitus type II   . Diverticulosis of colon   . GERD (gastroesophageal reflux disease)   .  H/O hiatal hernia   . Head mass   . Hepatitis   . Hx of   sessile serrated colonic polyp 02/27/2015  . Hyperlipidemia   . Hypertension   . Inguinal hernia, Rt reduced, Lt present 07/21/2013  . Low back pain   . Mitral valve prolapse 11/24/2013   By echo dec 2014  . Nephrolithiasis   . Obesity, morbid ([place])   . Occlusion and stenosis of carotid artery without mention of cerebral infarction    CAROTID DOPPLER, 03/18/2012 - srable, mild, hard, plaque noted, bilaterally  . Stroke [facility])     Social History   Tobacco Use  . Smoking status: Former Smoker    Types: Cigarettes    Quit date: 12/20/1989    Years since quitting: 29.6  . Smokeless tobacco: Never Used  Substance Use Topics  .  Alcohol use: No    Alcohol/week: 0.0 standard drinks  . Drug use: No    Family History  Problem Relation Age of Onset  . Heart attack Mother   . Diabetes Mother   . Heart attack Brother   . Heart attack Maternal Grandmother   . Hypertension Maternal Grandmother   . Pancreatic cancer Brother   . Kidney disease Brother   . Hypertension Son   . Kidney disease Son        ?  Marland Kitchen Alzheimer's disease Father   . Diabetes Brother   . Diabetes Sister    No Known Allergies  OBJECTIVE: Blood pressure 136/76, pulse 70, temperature 98.6 F (37 C), temperature source Axillary, resp. rate 20, height 5\' 7"  (1.702 m), weight 103.9 kg, SpO2 100 %.  Physical Exam Constitutional:      Comments: He appears to be sleeping.  Does not respond to voice commands.  Eyes:     Conjunctiva/sclera: Conjunctivae normal.  Cardiovascular:     Rate and Rhythm: Normal rate and regular rhythm.     Heart sounds: No murmur.  Pulmonary:     Effort: Pulmonary effort is normal.     Breath sounds: Normal breath sounds.  Abdominal:     Palpations: Abdomen is soft.  Musculoskeletal:        General: No swelling or tenderness.  Skin:    Findings: No rash.     Comments: He has no splinter hemorrhages.  IV site looks okay.     Lab Results Lab Results  Component Value Date   WBC 6.6 08/15/2019   HGB 10.9 (L) 08/15/2019   HCT 32.9 (L) 08/15/2019   MCV 101.5 (H) 08/15/2019   PLT 214 08/15/2019    Lab Results  Component Value Date   CREATININE 1.15 08/15/2019   BUN 16 08/15/2019   NA 142 08/15/2019   K 4.1 08/15/2019   CL 105 08/15/2019   CO2 30 08/15/2019    Lab Results  Component Value Date   ALT 30 08/15/2019   AST 37 08/15/2019   ALKPHOS 61 08/15/2019   BILITOT 0.4 08/15/2019     Microbiology: Recent Results (from the past 240 hour(s))  SARS CORONAVIRUS 2 (TAT 6-24 HRS) Nasopharyngeal Nasopharyngeal Swab     Status: None   Collection Time: 08/09/19 11:30 AM   Specimen: Nasopharyngeal Swab    Result Value Ref Range Status   SARS Coronavirus 2 NEGATIVE NEGATIVE Final    Comment: (NOTE) SARS-CoV-2 target nucleic acids are NOT DETECTED. The SARS-CoV-2 RNA is generally detectable in upper and lower respiratory specimens during the acute phase of infection. Negative results do not  preclude SARS-CoV-2 infection, do not rule out co-infections with other pathogens, and should not be used as the sole basis for treatment or other patient management decisions. Negative results must be combined with clinical observations, patient history, and epidemiological information. The expected result is Negative. Fact Sheet for Patients: SugarRoll.be Fact Sheet for Healthcare Providers: https://www.woods-mathews.com/ This test is not yet approved or cleared by the [place] FDA and  has been authorized for detection and/or diagnosis of SARS-CoV-2 by FDA under an Emergency Use Authorization (EUA). This EUA will remain  in effect (meaning this test can be used) for the duration of the COVID-19 declaration under Section 56 4(b)(1) of the Act, 21 U.S.C. section 360bbb-3(b)(1), unless the authorization is terminated or revoked sooner. Performed at [facility], [place] [address], [place], [place] [address]   Culture, blood (routine x 2)     Status: None (Preliminary result)   Collection Time: 08/14/19  9:32 AM   Specimen: BLOOD RIGHT HAND  Result Value Ref Range Status   Specimen Description BLOOD RIGHT HAND  Final   Special Requests   Final    BOTTLES DRAWN AEROBIC ONLY Blood Culture adequate volume   Culture  Setup Time   Final    AEROBIC BOTTLE ONLY GRAM POSITIVE COCCI CRITICAL VALUE NOTED.  VALUE IS CONSISTENT WITH PREVIOUSLY REPORTED AND CALLED VALUE.    Culture   Final    CULTURE REINCUBATED FOR BETTER GROWTH Performed at [facility], [place] [address] [place], [place] [address]    Report Status PENDING  Incomplete  Culture, blood  (routine x 2)     Status: None (Preliminary result)   Collection Time: 08/14/19  9:32 AM   Specimen: BLOOD RIGHT WRIST  Result Value Ref Range Status   Specimen Description BLOOD RIGHT WRIST  Final   Special Requests   Final    BOTTLES DRAWN AEROBIC ONLY Blood Culture adequate volume   Culture  Setup Time   Final    AEROBIC BOTTLE ONLY GRAM POSITIVE COCCI CRITICAL RESULT CALLED TO, READ BACK BY AND VERIFIED WITH: PHARMD C ROBERTSON [id number] AT 622 AN BY CM Performed at [facility], [place] [address] [place], [place] [address]    Culture GRAM POSITIVE COCCI  Final   Report Status PENDING  Incomplete  Blood Culture ID Panel (Reflexed)     Status: Abnormal   Collection Time: 08/14/19  9:32 AM  Result Value Ref Range Status   Enterococcus species NOT DETECTED NOT DETECTED Final   Listeria monocytogenes NOT DETECTED NOT DETECTED Final   Staphylococcus species DETECTED (A) NOT DETECTED Final    Comment: Methicillin (oxacillin) susceptible coagulase negative staphylococcus. Possible blood culture contaminant (unless isolated from more than one blood culture draw or clinical case suggests pathogenicity). No antibiotic treatment is indicated for blood  culture contaminants. CRITICAL RESULT CALLED TO, READ BACK BY AND VERIFIED WITH: PHARMD C ROBERTSON [id number] AT 625 AM BY CM    Staphylococcus aureus (BCID) NOT DETECTED NOT DETECTED Final   Methicillin resistance NOT DETECTED NOT DETECTED Final   Streptococcus species NOT DETECTED NOT DETECTED Final   Streptococcus agalactiae NOT DETECTED NOT DETECTED Final   Streptococcus pneumoniae NOT DETECTED NOT DETECTED Final   Streptococcus pyogenes NOT DETECTED NOT DETECTED Final   Acinetobacter baumannii NOT DETECTED NOT DETECTED Final   Enterobacteriaceae species NOT DETECTED NOT DETECTED Final   Enterobacter cloacae complex NOT DETECTED NOT DETECTED Final   Escherichia coli NOT DETECTED NOT DETECTED Final  Klebsiella oxytoca NOT DETECTED NOT  DETECTED Final   Klebsiella pneumoniae NOT DETECTED NOT DETECTED Final   Proteus species NOT DETECTED NOT DETECTED Final   Serratia marcescens NOT DETECTED NOT DETECTED Final   Haemophilus influenzae NOT DETECTED NOT DETECTED Final   Neisseria meningitidis NOT DETECTED NOT DETECTED Final   Pseudomonas aeruginosa NOT DETECTED NOT DETECTED Final   Candida albicans NOT DETECTED NOT DETECTED Final   Candida glabrata NOT DETECTED NOT DETECTED Final   Candida krusei NOT DETECTED NOT DETECTED Final   Candida parapsilosis NOT DETECTED NOT DETECTED Final   Candida tropicalis NOT DETECTED NOT DETECTED Final    Comment: Performed at [facility], [place] [address], [place], [place] [address]  Culture, Urine     Status: Abnormal   Collection Time: 08/14/19  3:49 PM   Specimen: Urine, Clean Catch  Result Value Ref Range Status   Specimen Description URINE, CLEAN CATCH  Final   Special Requests   Final    NONE Performed at [facility], [place] [address], [place], [place] [address]    Culture MULTIPLE SPECIES PRESENT, SUGGEST RECOLLECTION (A)  Final   Report Status 08/15/2019 FINAL  Final    Michel Bickers, MD [place] [facility] [phone] pager   [phone] cell 08/15/2019, 2:34 PM

## 2019-08-15 NOTE — Progress Notes (Signed)
Reason for consult: Stroke, seizure  Subjective: No acute events overnight.  Patient's daughter at bedside denies any seizure-like activity.  She states patient fluctuates, is able to have conversation at times but is more sleepy at other times.  ROS: Unable to obtain due to poor mental status  Examination  Vital signs in last 24 hours: Temp:  [98.3 F (36.8 C)-99.1 F (37.3 C)] 98.6 F (37 C) (01/11 1157) Pulse Rate:  [70-83] 70 (01/11 1157) Resp:  [18-20] 20 (01/11 1157) BP: (117-162)/(62-76) 136/76 (01/11 1157) SpO2:  [99 %-100 %] 100 % (01/11 1157) Weight:  [103.9 kg] 103.9 kg (01/11 0319)  General: lying in bed, NAD CVS: pulse-normal rate and rhythm RS: breathing comfortably Extremities: normal   Neuro: MS: Drowsy, opens eyes to repeated verbal stimuli, able to tell me his name and that he is in the hospital, intermittently follows simple one-step commands like squeezing hand and lifting his leg up but not consistently. CN: pupils equal and reactive, right gaze preference, left facial droop, unable to assess rest of the cranial nerves due to patient being very drowsy  Motor: Antigravity strength in all 4 extremities ( R>L)   Basic Metabolic Panel: Recent Labs  Lab 08/09/19 0254 08/09/19 0703 08/12/19 0505 08/14/19 0428 08/15/19 1051  NA 137 141 138 139 142  K 5.7* 4.9 4.3 4.2 4.1  CL 103 103 102 104 105  CO2  --  27 20* 24 30  GLUCOSE 127* 111* 89 180* 156*  BUN 30* 21 12 17 16   CREATININE 1.30* 1.35* 1.24 1.11 1.15  CALCIUM  --  9.8 9.1 9.0 9.1  MG  --   --   --  1.7  --     CBC: Recent Labs  Lab 08/09/19 0254 08/09/19 0703 08/10/19 0316 08/14/19 0932 08/15/19 1051  WBC  --  8.8 7.0 7.2 6.6  NEUTROABS  --  6.3  --   --   --   HGB 13.3 13.8 12.5* 11.2* 10.9*  HCT 39.0 40.2 36.0*  36.5* 33.1* 32.9*  MCV  --  100.5* 99.4 100.0 101.5*  PLT  --  124* 174 194 214     Coagulation Studies: No results for input(s): LABPROT, INR in the last 72  hours.  Imaging MRI brain without contrast 08/09/2019: Large acute infarct in the right MCA territory of surrounding the chronic infarct in the right parietal lobe.  CT head without contrast 08/14/2019: Progression of right MCA territory infarct, extending further into the insula and frontal operculum without hemorrhage or herniation.   ASSESSMENT AND PLAN: 79 year old male who initially presented on 08/09/2019 for confusion and left-sided weakness.  CT head showed acute infarct however patient was not a candidate for TPA or thrombectomy.  On 08/13/2018 around midnight, nurse noticed frequent left-sided twitching.  Dr. Leonel Ramsay evaluated the patient and started him on Keppra.  Patient was also found to have coagulase-negative staph bacteremia and is on Ancef for the same.  Acute infarct Epilepsy Coagulase-negative staph bacteremia Acute encephalopathy -Patient presented with acute stroke.  He was then noted to have focal motor seizures on 08/13/2018.  Has since also been diagnosed with bacteremia.  - Patient currently drowsy, likely secondary to stroke and bacteremia  Recommendations -24-hour LTM EEG did not show any seizures overnight.  Therefore will discontinue LTM. - In absence of any further seizures on LTM, will continue Keppra 750 mg twice daily. -Continue aspirin, Plavix and atorvastatin for secondary stroke prevention -I updated patient's daughter at bedside regarding  neurologic assessment and plan. -Continue seizure precautions -Minimize use of sedating medications, okay to use Ativan 1 mg only for generalized tonic-clonic seizure lasting more than 2 minutes or focal seizure lasting more than 5 minutes -Management of bacteremia and other comorbidities per primary team  Thank you for allowing [place] to participate in the care of this patient.  Please page neuro hospitalist for any further questions after 5 PM.  I have spent a total of 35 minutes with the patient reviewing hospital notes,   test results, labs and examining the patient as well as establishing an assessment and plan that was discussed personally with the patient and her daughter at bedside.  > 50% of time was spent in direct patient care.

## 2019-08-15 NOTE — Procedures (Signed)
Patient Name: Andres [name]  MRN: [id number]  Epilepsy Attending: Lora Havens  Referring Physician/Provider: Dr Karena Addison rule Duration: 08/13/2018 1128 to 08/15/2019 1250  Patient history: 76 M with history of seziure who presented with stroke with and frequent left-sided twitching concerning for seizure. EEG to evaluate for seizure.  Level of alertness:  Awake, lethragic  AEDs during EEG study: LEV  Technical aspects: This EEG study was done with scalp electrodes positioned according to the 10-20 International system of electrode placement. Electrical activity was acquired at a sampling rate of 500Hz  and reviewed with a high frequency filter of 70Hz  and a low frequency filter of 1Hz . EEG data were recorded continuously and digitally stored.   DESCRIPTION:  During awake state, no clear posterior dominant rhythm is seen. EEG showed continuous generalized and lateralized right hemisphere low amplitude 3-5hz  theta-delta slowing. Hyperventilation and photic stimulation were not performed.  ABNORMALITY - Continuous slow, generalized and lateralized right hemisphere  IMPRESSION: This study is suggestive of cortical dysfunction in right hemisphere likely secondary to underlying infarct as well as severe diffuse encephalopathy, non specific to etiology. No seizures or epileptiform discharges were seen throughout the recording.    Kalev Temme Barbra Sarks

## 2019-08-15 NOTE — Progress Notes (Signed)
  Speech Language Pathology Treatment: Dysphagia  Patient Details Name: Andres [name] MRN: [id number] DOB: 15-Dec-1940 Today's Date: 08/15/2019 Time: [id number] SLP Time Calculation (min) (ACUTE ONLY): 15 min  Assessment / Plan / Recommendation Clinical Impression  Limited session today, due to reduced alertness. Pt awakened to light touch and verbal stim. Oral care was completed with suction. Pt was then given one ice chip, which he did not manipulate or spit out. Water suctioned from oral cavity without swallow elicited. No further PO presentations given at this time. RN informed. SLP will continue to follow pt to assess readiness for po intake, and to complete SLE when pt able to participate.   HPI HPI: 79 y.o. male with history of stroke, HTN, HLD, DB, CAD, obesity, carotid disease, seizures, found down by son at home confused with L sided weakness. MRI: acute right MCA infarct surrounding old R MCA parietal infarct.       SLP Plan  Continue with current plan of care       Recommendations  Diet recommendations: NPO Medication Administration: Via alternative means                Oral Care Recommendations: Oral care QID Follow up Recommendations: Other (comment)(TBD) SLP Visit Diagnosis: Dysphagia, oropharyngeal phase (R13.12) Plan: Continue with current plan of care       [place], [facility], [place] Speech Language Pathologist Office: [phone] Pager: [phone]  Shonna Chock 08/15/2019, 4:01 PM

## 2019-08-15 NOTE — Progress Notes (Signed)
Physical Therapy Treatment Patient Details Name: Andres [name] MRN: [id number] DOB: 1940/09/01 Today's Date: 08/15/2019    History of Present Illness 79 yo male from home found laying on floor with incomprehensiable speech, L side weakness and not follow commands. Pt noted to be incontinent of urine.  R gaze preference and L side hemiparesis  CT (+) Hyperdense right middle cerebral artery distal M1 and proximal M2segments.PMH CAD with CABG 1997 DM HTn diverticulosis hiatal hernia head mass hepatitis lowe backpain mitral valve prolapse obese CVA hx smoking 29 years    PT Comments    Pt performed sit to stand with moderate +1 assistance.  He stood x 2 min with hand over hand placement on L side to promote weight bearing.  He sat prematurely and began to gaze to the R side.  Pt not folliowing commands at times and required +2 max for additional sit to stand to remove stedy plates.  Based on presentation and difficulty focusing and maintaining task engagement he was difficult to progress this session.   Daughter reports he has not been sleeping.  Will continue to recommend CIR admission as he continues to benefit from intensive therapy before returning home.   Follow Up Recommendations  CIR;Supervision/Assistance - 24 hour     Equipment Recommendations  Other (comment)(TBA)    Recommendations for Other Services       Precautions / Restrictions Precautions Precautions: Fall Restrictions Weight Bearing Restrictions: No    Mobility  Bed Mobility Overal bed mobility: Needs Assistance Bed Mobility: Supine to Sit;Sit to Supine Rolling: Max assist Sidelying to sit: Max assist       General bed mobility comments: Max assistance to advance LEs to edge of bed and elevate trunk into a seated position.  L side continues to present with myoclonic jerking.  To return to bed he required +2 total assistance.  He also required +2 total assistance to boost to [facility] and bed placed in chair position to  better manage secretions.  Transfers Overall transfer level: Needs assistance Equipment used: Ambulation equipment used(sara stedy.) Transfers: Sit to/from Stand Sit to Stand: +2 physical assistance;Max assist(Mod +1 during first sit to stand and max +2 for addtional sit to stand when patient not following commands.)         General transfer comment: Pt performed sit to stand from bedin sara stedy with mod +1 from elevated surface.  He stood x 2 min to allow for bed linen change.  He sat prematurely onto stedy plates and required max +2 to achieve standing to remove stedy plates.  Ambulation/Gait Ambulation/Gait assistance: (NT)               Stairs             Wheelchair Mobility    Modified Rankin (Stroke Patients Only)       Balance Overall balance assessment: Needs assistance Sitting-balance support: No upper extremity supported;Feet supported Sitting balance-Leahy Scale: Poor     Standing balance support: Single extremity supported;During functional activity Standing balance-Leahy Scale: Poor                              Cognition Arousal/Alertness: Lethargic;Suspect due to medications(intermittent falling asleep, gazes off and appears to fall asleep, required increased cueing to participate in activity.) Behavior During Therapy: Flat affect;Restless Overall Cognitive Status: Impaired/Different from baseline Area of Impairment: Orientation;Attention;Memory;Following commands;Safety/judgement;Awareness;Problem solving  Orientation Level: Disoriented to;Place;Time;Situation Current Attention Level: Focused Memory: Decreased short-term memory Following Commands: Follows one step commands inconsistently;Follows one step commands with increased time Safety/Judgement: Decreased awareness of safety;Decreased awareness of deficits Awareness: Intellectual   General Comments: Pt unable to focus on task during session,  Lots of  invoultary movement on L side.      Exercises      General Comments        Pertinent Vitals/Pain Pain Assessment: Faces Faces Pain Scale: No hurt    Home Living                      Prior Function            PT Goals (current goals can now be found in the care plan section) Acute Rehab PT Goals Potential to Achieve Goals: Fair Progress towards PT goals: Not progressing toward goals - comment(limited due to presentation today)    Frequency    Min 4X/week      PT Plan Current plan remains appropriate    Co-evaluation              AM-PAC PT "6 Clicks" Mobility   Outcome Measure  Help needed turning from your back to your side while in a flat bed without using bedrails?: Total Help needed moving from lying on your back to sitting on the side of a flat bed without using bedrails?: Total Help needed moving to and from a bed to a chair (including a wheelchair)?: Total Help needed standing up from a chair using your arms (e.g., wheelchair or bedside chair)?: Total Help needed to walk in hospital room?: Total Help needed climbing 3-5 steps with a railing? : Total 6 Click Score: 6    End of Session Equipment Utilized During Treatment: Gait belt Activity Tolerance: Patient limited by fatigue Patient left: in bed;with call bell/phone within reach;with bed alarm set Nurse Communication: Mobility status PT Visit Diagnosis: Other abnormalities of gait and mobility (R26.89);Other symptoms and signs involving the nervous system (R29.898)     Time: [id number] PT Time Calculation (min) (ACUTE ONLY): 26 min  Charges:  $Therapeutic Activity: 23-37 mins                     Erasmo Leventhal , PTA Acute Rehabilitation Services Pager [phone] Office [phone]     Praise Stennett Eli Hose 08/15/2019, 5:02 PM

## 2019-08-15 NOTE — Progress Notes (Addendum)
PROGRESS NOTE    Andres [name]  [id number] DOB: May 16, 1941 DOA: 08/09/2019 PCP: [name], MD   Brief Narrative: Andres [name] is a 79 y.o. male with a history of hypertension, hyperlipidemia, CAD status post CABG, carotid artery stenosis, CVA, mitral valve prolapse, diabetes mellitus type 2, BPH, hepatitis, GERD.  Patient presented secondary to being found down with associated left-sided weakness and dysarthria and found to have an acute right MCA stroke.  Patient with significant dysphagia secondary to stroke requiring alternate means of nutrition at this time.   Assessment & Plan:   Principal Problem:   Acute ischemic stroke [facility]) Active Problems:   Hyperlipidemia   Essential hypertension   BPH (benign prostatic hyperplasia)   CAD (coronary artery disease), hx of CABG 1997 X 6. Last nuc 2012 negative.   Morbid obesity ([place])   Transaminitis   History of seizures   Acute right MCA stroke Patient with left sided weakness and dysarthria. MRI significant for large area of acute infarct of right MCA territory. LDL of 100. Hemoglobin A1C of 6.2%. Transthoracic Echocardiogram significant for no evidence of embolic source. PT/OT recommendations pending.significant dysphagia and left hemiplegia. Speech therapy recommending n.p.o and tube feeds. Discussed need for NG tube with patient and he understands. NG tube placed on 08/12/2019. Concern for worsening stroke symptoms on 1/10 and rapid response called. -Cortrak NG tube ordered; dietitian for tube feeds. Continue tube feeds -Neurology recommendations: Dual antiplatelet therapy for 3 months, followed by Plavix alone -PT/OT recommendations: CIR  Delirium Unknown etiology. Patient given ativan with adverse effect. Delirium improved. Mental status improved as a whole.  History of seizures Patient is on Keppra 500 mg BID as an outpatient. EEG obtained with no evidence of seizures. Having feft sided tremors. Concern for subclinical  seizures. Patient underwent CT scan which was significant for progression of right MCA territory infarct. -Neurology recommendations: continuous EEG, neuro checks. Pending today.  Lethargy Secondary to ativan. Concern this could also be post-ictal state vs related to worsening of underlying stroke. ABG reassuring. Possibly related to bacterial infection.  Coagulase negative staph bacteremia Likely contributing to mental status change. Started on Ancef. Currently with no central lines. -Continue Ancef -Follow-up blood culture sensitivities -Will consult ID  Dysphagia In setting of acute stroke.  Speech therapy recommending alternate means of nutrition and n.p.o.  As patient has failed her swallow study. Cortrak placed 1/8 and tube feeds initiated. -Tube feeds  Essential hypertension Mildly uncontrolled.  Patient is on HCTZ, losartan, metoprolol as an outpatient.  These were held on admission secondary to acute stroke. -Continue Metoprolol -Add back losartan and HCTZ as needed  Diabetes mellitus, type II Controlled.  Hemoglobin A1c of 6.2%.  Patient is not on outpatient regimen currently.  CAD Patient has a history of CABG in 1997.  Patient is on Lipitor, aspirin, Plavix, omega-3 fatty acids as an outpatient. -Continue aspirin, Plavix, Lipitor  CKD stage III  Stable  Elevated AST Minimally elevated  BPH -Continue Flomax  Hyperlipidemia LDL 62.  Patient is on Lipitor and omega-3 fatty acid as an outpatient. -Continue Lipitor 40 mg daily  Morbid obesity Body mass index is 35.88 kg/m.   DVT prophylaxis: SCDs Code Status:   Code Status: Full Code Family Communication: Daughter via telephone Disposition Plan: Discharge per therapy recommendations   Consultants:   Neurology  Infection disease  Procedures:   08/09/2019: Transthoracic Echocardiogram IMPRESSIONS    1. Left ventricular ejection fraction, by visual estimation, is 60%. The left ventricle  has normal  function. There is no left ventricular hypertrophy.  2. Abnormal septal motion secondary to conduction delay.  3. Elevated left ventricular end-diastolic pressure.  4. Left ventricular diastolic parameters are consistent with Grade II diastolic dysfunction (pseudonormalization).  5. Global right ventricle has normal systolic function.The right ventricular size is normal. No increase in right ventricular wall thickness.  6. Left atrial size was normal.  7. Right atrial size was normal.  8. The mitral valve is abnormal. Trivial mitral valve regurgitation. No evidence of mitral stenosis.  9. Mild bileaflet mitral valve prolapse. 10. The tricuspid valve is normal in structure. 11. The aortic valve is normal in structure. Aortic valve regurgitation is not visualized. No evidence of aortic valve sclerosis or stenosis. 12. The pulmonic valve was normal in structure. Pulmonic valve regurgitation is trivial. 13. The inferior vena cava is normal in size with greater than 50% respiratory variability, suggesting right atrial pressure of 3 mmHg. 14. The interatrial septum was not well visualized. 15. No intracardiac source of emboli noted.   1/5: EEG DESCRIPTION: No clear posterior dominant rhythm was seen. EEG showed continuous generalized and lateralized right hemisphere 3-6hz  theta-delta slowing. Hyperventilation and photic stimulation were not performed.  ABNORMALITY - Continuous slow, generalized and lateralized right hemisphere  IMPRESSION: This study is suggestive of cortical dysfunction in right hemisphere consistent with underlying infarct. Additionally, there is evidence of moderate diffuse encephalopathy. No seizures or epileptiform discharges were seen throughout the recording.  Antimicrobials:  Cefazolin   Subjective: No issues  Objective: Vitals:   08/14/19 2000 08/15/19 0000 08/15/19 0319 08/15/19 0807  BP: 139/74 126/67 (!) 143/70 (!) 162/74  Pulse: 79 72 78 83  Resp: 20  20 18 20   Temp: 98.6 F (37 C) 98.4 F (36.9 C) 98.3 F (36.8 C) 99 F (37.2 C)  TempSrc: Oral Oral Oral Oral  SpO2: 100% 100% 100% 99%  Weight:   103.9 kg   Height:        Intake/Output Summary (Last 24 hours) at 08/15/2019 1021 Last data filed at 08/15/2019 0320 Gross per 24 hour  Intake 1781.67 ml  Output 400 ml  Net 1381.67 ml   Filed Weights   08/10/19 2006 08/14/19 0215 08/15/19 0319  Weight: 104.2 kg 103.9 kg 103.9 kg    Examination:  General exam: Appears calm and comfortable Respiratory system: Rhonchi. Respiratory effort normal. Cardiovascular system: S1 & S2 heard, RRR. No murmurs, rubs, gallops or clicks. Gastrointestinal system: Abdomen is nondistended, soft and nontender. No organomegaly or masses felt. Normal bowel sounds heard. Central nervous system: Alert and oriented to person. Extremities: No edema. No calf tenderness Skin: No cyanosis. No rashes Psychiatry: Judgement and insight appear impaired. Blunt affect    Data Reviewed: I have personally reviewed following labs and imaging studies  CBC: Recent Labs  Lab 08/09/19 0254 08/09/19 0703 08/10/19 0316 08/14/19 0932  WBC  --  8.8 7.0 7.2  NEUTROABS  --  6.3  --   --   HGB 13.3 13.8 12.5* 11.2*  HCT 39.0 40.2 36.0*  36.5* 33.1*  MCV  --  100.5* 99.4 100.0  PLT  --  124* 174 [id number]   Basic Metabolic Panel: Recent Labs  Lab 08/09/19 0254 08/09/19 0703 08/12/19 0505 08/14/19 0428  NA 137 141 138 139  K 5.7* 4.9 4.3 4.2  CL 103 103 102 104  CO2  --  27 20* 24  GLUCOSE 127* 111* 89 180*  BUN 30* 21 12 17   CREATININE  1.30* 1.35* 1.24 1.11  CALCIUM  --  9.8 9.1 9.0  MG  --   --   --  1.7   GFR: Estimated Creatinine Clearance: 61 mL/min (by C-G formula based on SCr of 1.11 mg/dL). Liver Function Tests: Recent Labs  Lab 08/09/19 0703  AST 45*  ALT 26  ALKPHOS 70  BILITOT 0.4  PROT 6.8  ALBUMIN 4.1   No results for input(s): LIPASE, AMYLASE in the last 168 hours. No results for  input(s): AMMONIA in the last 168 hours. Coagulation Profile: Recent Labs  Lab 08/09/19 0243 08/09/19 0703 08/09/19 0920  INR 1.1 1.1 1.0   Cardiac Enzymes: No results for input(s): CKTOTAL, CKMB, CKMBINDEX, TROPONINI in the last 168 hours. BNP (last 3 results) No results for input(s): PROBNP in the last 8760 hours. HbA1C: No results for input(s): HGBA1C in the last 72 hours. CBG: Recent Labs  Lab 08/14/19 1528 08/14/19 1941 08/14/19 2339 08/15/19 0339 08/15/19 0804  GLUCAP 154* 147* 154* 138* 147*   Lipid Profile: No results for input(s): CHOL, HDL, LDLCALC, TRIG, CHOLHDL, LDLDIRECT in the last 72 hours. Thyroid Function Tests: No results for input(s): TSH, T4TOTAL, FREET4, T3FREE, THYROIDAB in the last 72 hours. Anemia Panel: No results for input(s): VITAMINB12, FOLATE, FERRITIN, TIBC, IRON, RETICCTPCT in the last 72 hours. Sepsis Labs: No results for input(s): PROCALCITON, LATICACIDVEN in the last 168 hours.  Recent Results (from the past 240 hour(s))  SARS CORONAVIRUS 2 (TAT 6-24 HRS) Nasopharyngeal Nasopharyngeal Swab     Status: None   Collection Time: 08/09/19 11:30 AM   Specimen: Nasopharyngeal Swab  Result Value Ref Range Status   SARS Coronavirus 2 NEGATIVE NEGATIVE Final    Comment: (NOTE) SARS-CoV-2 target nucleic acids are NOT DETECTED. The SARS-CoV-2 RNA is generally detectable in upper and lower respiratory specimens during the acute phase of infection. Negative results do not preclude SARS-CoV-2 infection, do not rule out co-infections with other pathogens, and should not be used as the sole basis for treatment or other patient management decisions. Negative results must be combined with clinical observations, patient history, and epidemiological information. The expected result is Negative. Fact Sheet for Patients: SugarRoll.be Fact Sheet for Healthcare Providers: https://www.woods-mathews.com/ This test is  not yet approved or cleared by the [place] FDA and  has been authorized for detection and/or diagnosis of SARS-CoV-2 by FDA under an Emergency Use Authorization (EUA). This EUA will remain  in effect (meaning this test can be used) for the duration of the COVID-19 declaration under Section 56 4(b)(1) of the Act, 21 U.S.C. section 360bbb-3(b)(1), unless the authorization is terminated or revoked sooner. Performed at [facility], [place] [address], [place], [place] [address]   Culture, blood (routine x 2)     Status: None (Preliminary result)   Collection Time: 08/14/19  9:32 AM   Specimen: BLOOD RIGHT HAND  Result Value Ref Range Status   Specimen Description BLOOD RIGHT HAND  Final   Special Requests   Final    BOTTLES DRAWN AEROBIC ONLY Blood Culture adequate volume   Culture  Setup Time   Final    AEROBIC BOTTLE ONLY GRAM POSITIVE COCCI CRITICAL VALUE NOTED.  VALUE IS CONSISTENT WITH PREVIOUSLY REPORTED AND CALLED VALUE.    Culture   Final    NO GROWTH < 24 HOURS Performed at [facility], [place] [address] [place], [place] [address]    Report Status PENDING  Incomplete  Culture, blood (routine x 2)  Status: None (Preliminary result)   Collection Time: 08/14/19  9:32 AM   Specimen: BLOOD RIGHT WRIST  Result Value Ref Range Status   Specimen Description BLOOD RIGHT WRIST  Final   Special Requests   Final    BOTTLES DRAWN AEROBIC ONLY Blood Culture adequate volume   Culture  Setup Time   Final    AEROBIC BOTTLE ONLY GRAM POSITIVE COCCI CRITICAL RESULT CALLED TO, READ BACK BY AND VERIFIED WITH: PHARMD C ROBERTSON [id number] AT 32 AN BY CM Performed at [facility], [place] [address], [place], [place] [address]    Culture GRAM POSITIVE COCCI  Final   Report Status PENDING  Incomplete  Blood Culture ID Panel (Reflexed)     Status: Abnormal   Collection Time: 08/14/19  9:32 AM  Result Value Ref Range Status   Enterococcus species NOT DETECTED NOT DETECTED Final    Listeria monocytogenes NOT DETECTED NOT DETECTED Final   Staphylococcus species DETECTED (A) NOT DETECTED Final    Comment: Methicillin (oxacillin) susceptible coagulase negative staphylococcus. Possible blood culture contaminant (unless isolated from more than one blood culture draw or clinical case suggests pathogenicity). No antibiotic treatment is indicated for blood  culture contaminants. CRITICAL RESULT CALLED TO, READ BACK BY AND VERIFIED WITH: PHARMD C ROBERTSON [id number] AT 625 AM BY CM    Staphylococcus aureus (BCID) NOT DETECTED NOT DETECTED Final   Methicillin resistance NOT DETECTED NOT DETECTED Final   Streptococcus species NOT DETECTED NOT DETECTED Final   Streptococcus agalactiae NOT DETECTED NOT DETECTED Final   Streptococcus pneumoniae NOT DETECTED NOT DETECTED Final   Streptococcus pyogenes NOT DETECTED NOT DETECTED Final   Acinetobacter baumannii NOT DETECTED NOT DETECTED Final   Enterobacteriaceae species NOT DETECTED NOT DETECTED Final   Enterobacter cloacae complex NOT DETECTED NOT DETECTED Final   Escherichia coli NOT DETECTED NOT DETECTED Final   Klebsiella oxytoca NOT DETECTED NOT DETECTED Final   Klebsiella pneumoniae NOT DETECTED NOT DETECTED Final   Proteus species NOT DETECTED NOT DETECTED Final   Serratia marcescens NOT DETECTED NOT DETECTED Final   Haemophilus influenzae NOT DETECTED NOT DETECTED Final   Neisseria meningitidis NOT DETECTED NOT DETECTED Final   Pseudomonas aeruginosa NOT DETECTED NOT DETECTED Final   Candida albicans NOT DETECTED NOT DETECTED Final   Candida glabrata NOT DETECTED NOT DETECTED Final   Candida krusei NOT DETECTED NOT DETECTED Final   Candida parapsilosis NOT DETECTED NOT DETECTED Final   Candida tropicalis NOT DETECTED NOT DETECTED Final    Comment: Performed at [facility], [place] [address], [place], [place] [address]         Radiology Studies: CT HEAD WO CONTRAST  Result Date: 08/14/2019 CLINICAL DATA:   Stroke follow-up. Worsening stroke symptoms. EXAM: CT HEAD WITHOUT CONTRAST TECHNIQUE: Contiguous axial images were obtained from the base of the skull through the vertex without intravenous contrast. COMPARISON:  08/09/2019 FINDINGS: Brain: Slight expansion of right MCA territory infarct, now involving more of the insula and frontal operculum. There is white matter hypoattenuation consistent with chronic ischemic microangiopathy. No hemorrhage. No herniation or other significant mass effect. No hydrocephalus. Vascular: Atherosclerotic calcification of the internal carotid arteries at the skull base. No abnormal hyperdensity of the major intracranial arteries or dural venous sinuses. Skull: The visualized skull base, calvarium and extracranial soft tissues are normal. Sinuses/Orbits: No fluid levels or advanced mucosal thickening of the visualized paranasal sinuses. No mastoid or middle ear effusion. The orbits are normal. Other: None. IMPRESSION:  Progression of right MCA territory infarct, extending further into the insula and frontal operculum without hemorrhage or herniation. Electronically Signed   By: Ulyses Jarred M.D.   On: 08/14/2019 02:16   DG CHEST PORT 1 VIEW  Result Date: 08/14/2019 CLINICAL DATA:  Fever. Shortness of breath. EXAM: PORTABLE CHEST 1 VIEW COMPARISON:  08/09/2019 and 09/05/2018 FINDINGS: Feeding tube tip is in the region of the duodenal bulb. Heart size and pulmonary vascularity are normal. Minimal residual atelectasis at the left base medially. The lungs are otherwise clear. CABG. No bone abnormality. IMPRESSION: 1. Minimal residual atelectasis at the left lung base. Lung aeration has improved bilaterally. 2. Feeding tube tip is in the duodenal bulb. Electronically Signed   By: Lorriane Shire M.D.   On: 08/14/2019 10:06   EEG adult  Result Date: 08/14/2019 Lora Havens, MD     08/14/2019 11:45 AM Patient Name: BURCE TOYE MRN: [id number] Epilepsy Attending: Lora Havens  Referring Physician/Provider: Dr Roland Rack Date: 08/13/2018 Duration: 25.56 mins Patient history: 55 M with history of seziure who presented with stroke with and frequent left-sided twitching concerning for seizure. EEG to evaluate for seizure. Level of alertness: asleep/sedated AEDs during EEG study: LEV Technical aspects: This EEG study was done with scalp electrodes positioned according to the 10-20 International system of electrode placement. Electrical activity was acquired at a sampling rate of 500Hz  and reviewed with a high frequency filter of 70Hz  and a low frequency filter of 1Hz . EEG data were recorded continuously and digitally stored. DESCRIPTION: EEG showed continuous generalized and lateralized right hemisphere low amplitude 3-5hz  theta-delta slowing. Hyperventilation and photic stimulation were not performed. ABNORMALITY - Continuous slow, generalized and lateralized right hemisphere IMPRESSION: This study is suggestive of cortical dysfunction in right hemisphere likely secondary to underlying infarct as well as severe diffuse encephalopathy, non specific to etiology. No seizures or epileptiform discharges were seen throughout the recording. Priyanka Barbra Sarks        Scheduled Meds: .  stroke: mapping our early stages of recovery book   Does not apply Once  . aspirin  300 mg Rectal Daily   Or  . aspirin  81 mg Oral Daily  . atorvastatin  40 mg Oral Daily  . clopidogrel  75 mg Oral Daily  . feeding supplement (PRO-STAT SUGAR FREE 64)  30 mL Per Tube Daily  . metoprolol tartrate  50 mg Per Tube BID  . tamsulosin  0.4 mg Oral Daily   Continuous Infusions: .  ceFAZolin (ANCEF) IV 2 g (08/15/19 0755)  . feeding supplement (JEVITY 1.2 CAL) 1,000 mL (08/14/19 1810)  . levETIRAcetam Stopped (08/15/19 [id number])     LOS: 6 days     Cordelia Poche, MD [facility] 08/15/2019, 10:21 AM  If 7PM-7AM, please contact night-coverage [email or web address]

## 2019-08-15 NOTE — Progress Notes (Signed)
LTM maint complete - no skin breakdown under:  Fz, t6, o2 cm/na

## 2019-08-15 NOTE — Progress Notes (Signed)
LTM EEG discontinued - no skin breakdown at unhook.   

## 2019-08-16 DIAGNOSIS — R7881 Bacteremia: Secondary | ICD-10-CM

## 2019-08-16 LAB — GLUCOSE, CAPILLARY
Glucose-Capillary: 96 mg/dL (ref 70–99)
Glucose-Capillary: 96 mg/dL (ref 70–99)
Glucose-Capillary: 99 mg/dL (ref 70–99)
Glucose-Capillary: 141 mg/dL — ABNORMAL HIGH (ref 70–99)
Glucose-Capillary: 148 mg/dL — ABNORMAL HIGH (ref 70–99)

## 2019-08-16 NOTE — Progress Notes (Signed)
Occupational Therapy Treatment  Upon arrival, pt supine and sleeping. Pt presenting with significant lethargy with difficulty following commands and occasionally opening his eyes to his name. Pt with urinary inconsistence in bed and requiring Total A for rolling and peri care. Pt presenting with increased edema in scrotal area; RN present and aware. Fabricating and issuing scrotal sling to decrease scrotal edema; educating RN on sling management. Pt requiring Total A for repositioning in bed and elevating HOB for chair position in bed. Update dc to SNF for further OT and will continue to follow acutely as admitted.     08/16/19 1600  OT Visit Information  Last OT Received On 08/16/19  Assistance Needed +2  PT/OT/SLP Co-Evaluation/Treatment Yes  Reason for Co-Treatment For patient/therapist safety;To address functional/ADL transfers;Complexity of the patient's impairments (multi-system involvement)  OT goals addressed during session ADL's and self-care  History of Present Illness 79 yo male from home found laying on floor with incomprehensiable speech, L side weakness and not follow commands. Pt noted to be incontinent of urine.  R gaze preference and L side hemiparesis  CT (+) Hyperdense right middle cerebral artery distal M1 and proximal M2segments.PMH CAD with CABG 1997 DM HTn diverticulosis hiatal hernia head mass hepatitis lowe backpain mitral valve prolapse obese CVA hx smoking 29 years  Precautions  Precautions Fall  Pain Assessment  Pain Assessment Faces  Faces Pain Scale 0  Pain Intervention(s) Monitored during session  Cognition  Arousal/Alertness Lethargic (Keeping her eyes open throughout. Occasionally opening eyes to name)  Behavior During Therapy Flat affect (Sleepy)  Overall Cognitive Status Impaired/Different from baseline  Area of Impairment Orientation;Attention;Memory;Following commands;Safety/judgement;Awareness;Problem solving  Orientation Level Disoriented  to;Place;Time;Situation  Current Attention Level Focused  Memory Decreased short-term memory  Following Commands Follows one step commands inconsistently;Follows one step commands with increased time  Safety/Judgement Decreased awareness of safety;Decreased awareness of deficits  Awareness Intellectual  Problem Solving Slow processing;Difficulty sequencing;Requires verbal cues  General Comments Very lethargic and keeping his eye closed. Occasionally opening eyes to name. Very drowsy.   Upper Extremity Assessment  Upper Extremity Assessment LUE deficits/detail;Difficult to assess due to impaired cognition  LUE Deficits / Details Noting minimal active movement of left UE  LUE Coordination decreased gross motor;decreased fine motor  Lower Extremity Assessment  Lower Extremity Assessment Defer to PT evaluation  ADL  Overall ADL's  Needs assistance/impaired  Toileting- Clothing Manipulation and Hygiene Total assistance;+2 for physical assistance;Bed level  Toileting - Clothing Manipulation Details (indicate cue type and reason) Pt requiring Total A for peri care after urinary incontience in the bed. Pt with deceased following of commands for rolling in bed.   Functional mobility during ADLs Total assistance (bed mobility only)  General ADL Comments Pt presenting with decreased arousal and significant lethargy. Pt with urinary incontience in bed and requiring Total A for bed mobility and peri care. Repositioning in bed and placing bed in chair position. Pt occasionally opening eyes to his name.   Bed Mobility  Overal bed mobility Needs Assistance  Bed Mobility Rolling  Rolling Total assist;+2 for physical assistance  General bed mobility comments Pt requiring Total A +2 for rolling in bed during toileting as pt with urinary incontience. Requiring Total A +2 for repositioning.   Restrictions  Weight Bearing Restrictions No  Vision- Assessment  Vision Assessment? Yes;Vision impaired- to be  further tested in functional context  Alignment/Gaze Preference Gaze right  Additional Comments Too lethargic to assess  Transfers  General transfer comment Defered for safety  General Comments  General comments (skin integrity, edema, etc.) Pt with increased edema at scrotum. Issued and fabicated srotal sling to decreased edema. Educated Therapist, sports on use of sling and management.   OT - End of Session  Equipment Utilized During Treatment Gait belt  Activity Tolerance Patient limited by lethargy (Restlessness)  Patient left in bed;with call bell/phone within reach;with bed alarm set  Nurse Communication Mobility status (Urine incontience in bed)  OT Assessment/Plan  OT Plan Discharge plan needs to be updated  OT Visit Diagnosis Unsteadiness on feet (R26.81);Other abnormalities of gait and mobility (R26.89);Muscle weakness (generalized) (M62.81);Other symptoms and signs involving cognitive function  OT Frequency (ACUTE ONLY) Min 2X/week  Recommendations for Other Services PT consult  Follow Up Recommendations Supervision/Assistance - 24 hour;SNF  OT Equipment Other (comment) (Defer to next venue )  AM-PAC OT "6 Clicks" Daily Activity Outcome Measure (Version 2)  Help from another person eating meals? 1  Help from another person taking care of personal grooming? 2  Help from another person toileting, which includes using toliet, bedpan, or urinal? 2  Help from another person bathing (including washing, rinsing, drying)? 2  Help from another person to put on and taking off regular upper body clothing? 2  Help from another person to put on and taking off regular lower body clothing? 2  6 Click Score 11  OT Goal Progression  Progress towards OT goals Progressing toward goals  Acute Rehab OT Goals  Patient Stated Goal Unstated  OT Goal Formulation Patient unable to participate in goal setting  Time For Goal Achievement 08/25/19  Potential to Achieve Goals Good  ADL Goals  Pt Will Perform  Grooming with min assist;sitting  Pt Will Transfer to Toilet with min assist;bedside commode;stand pivot transfer  Additional ADL Goal #1 Pt will perform bed mobility with Min A in preparation for ADLs  Additional ADL Goal #2 Pt will sustain attention to ADL with 2-3 cues  Additional ADL Goal #3 Pt will tolerate sitting at EOB with Min Guard a for ~5 minutes in preparation for ADLs  OT Time Calculation  OT Start Time (ACUTE ONLY) 1446  OT Stop Time (ACUTE ONLY) 1514  OT Time Calculation (min) 28 min  OT General Charges  $OT Visit 1 Visit  OT Treatments  $Self Care/Home Management  8-22 mins   [place], OTR/L Acute Rehab Pager: [phone] Office: [phone]

## 2019-08-16 NOTE — Progress Notes (Signed)
Inpatient Rehabilitation Admissions Coordinator  I continue to await [employer] medicare insurance decision for a possible inpt rehab admit.  Danne Baxter, RN, MSN Rehab Admissions Coordinator [phone] 08/16/2019 12:32 PM

## 2019-08-16 NOTE — Progress Notes (Addendum)
PROGRESS NOTE    Andres [name]  [id number] DOB: [birth year] DOA: 08/09/2019 PCP: [name], MD   Brief Narrative: Andres [name] is a 79 y.o. male with a history of hypertension, hyperlipidemia, CAD status post CABG, carotid artery stenosis, CVA, mitral valve prolapse, diabetes mellitus type 2, BPH, hepatitis, GERD.  Patient presented secondary to being found down with associated left-sided weakness and dysarthria and found to have an acute right MCA stroke.  Patient with significant dysphagia secondary to stroke requiring alternate means of nutrition at this time.   Assessment & Plan:   Principal Problem:   Acute ischemic stroke [facility]) Active Problems:   Hyperlipidemia   Essential hypertension   BPH (benign prostatic hyperplasia)   CAD (coronary artery disease), hx of CABG 1997 X 6. Last nuc 2012 negative.   Morbid obesity ([place])   Transaminitis   History of seizures   Positive blood cultures   Acute right MCA stroke Patient with left sided weakness and dysarthria. MRI significant for large area of acute infarct of right MCA territory. LDL of 100. Hemoglobin A1C of 6.2%. Transthoracic Echocardiogram significant for no evidence of embolic source. PT/OT recommendations pending.significant dysphagia and left hemiplegia. Speech therapy recommending n.p.o and tube feeds. Discussed need for NG tube with patient and he understands. NG tube placed on 08/12/2019. Concern for.... worsening stroke symptoms on 1/10 and rapid response called. -Cortrak NG tube ordered; dietitian for tube feeds. Continue tube feeds -Neurology recommendations: Dual antiplatelet therapy for 3 months, followed by Plavix alone -PT/OT recommendations: CIR  Delirium Unknown etiology. Patient given ativan with adverse effect. Delirium improved. Mental status improved as a whole.  History of seizures Patient is on Keppra 500 mg BID as an outpatient. EEG obtained with no evidence of seizures. Having feft sided  tremors. Concern for subclinical seizures. Patient underwent CT scan which was significant for progression of right MCA territory infarct. -Neurology recommendations: continue Keppra 750 mg BID; ativan 1 mg only for generalized tonic-clonic seizure lasting more than 2 minutes or focal seizure lasting more than 5 minutes  Lethargy Secondary to ativan. Concern this could also be post-ictal state vs related to worsening of underlying stroke. ABG reassuring. Possibly related to bacterial infection. Improved today.  Staphylococcus capitis bacteremia Likely contributing to mental status change. Started on Ancef. Currently with no central lines. Mental status improved with treatment. -Continue Ancef -Follow-up blood culture sensitivities -ID recommendations  Dysphagia In setting of acute stroke.  Speech therapy recommending alternate means of nutrition and n.p.o.  As patient has failed her swallow study. Cortrak placed 1/8 and tube feeds initiated. -Tube feeds  Essential hypertension Mildly uncontrolled.  Patient is on HCTZ, losartan, metoprolol as an outpatient.  These were held on admission secondary to acute stroke. -Continue Metoprolol -Add back losartan and HCTZ as needed; likely restart in 1-2 days  Diabetes mellitus, type II Controlled.  Hemoglobin A1c of 6.2%.  Patient is not on outpatient regimen currently.  CAD Patient has a history of CABG in 1997.  Patient is on Lipitor, aspirin, Plavix, omega-3 fatty acids as an outpatient. -Continue aspirin, Plavix, Lipitor  CKD stage III  Stable  Elevated AST Minimally elevated  BPH -Continue Flomax  Hyperlipidemia LDL 62.  Patient is on Lipitor and omega-3 fatty acid as an outpatient. -Continue Lipitor 40 mg daily  Morbid obesity Body mass index is 35.7 kg/m.   DVT prophylaxis: SCDs Code Status:   Code Status: Full Code Family Communication: Daughter at bedside Disposition Plan: Discharge per  therapy  recommendations   Consultants:   Neurology  Infection disease  Procedures:   08/09/2019: Transthoracic Echocardiogram IMPRESSIONS    1. Left ventricular ejection fraction, by visual estimation, is 60%. The left ventricle has normal function. There is no left ventricular hypertrophy.  2. Abnormal septal motion secondary to conduction delay.  3. Elevated left ventricular end-diastolic pressure.  4. Left ventricular diastolic parameters are consistent with Grade II diastolic dysfunction (pseudonormalization).  5. Global right ventricle has normal systolic function.The right ventricular size is normal. No increase in right ventricular wall thickness.  6. Left atrial size was normal.  7. Right atrial size was normal.  8. The mitral valve is abnormal. Trivial mitral valve regurgitation. No evidence of mitral stenosis.  9. Mild bileaflet mitral valve prolapse. 10. The tricuspid valve is normal in structure. 11. The aortic valve is normal in structure. Aortic valve regurgitation is not visualized. No evidence of aortic valve sclerosis or stenosis. 12. The pulmonic valve was normal in structure. Pulmonic valve regurgitation is trivial. 13. The inferior vena cava is normal in size with greater than 50% respiratory variability, suggesting right atrial pressure of 3 mmHg. 14. The interatrial septum was not well visualized. 15. No intracardiac source of emboli noted.   1/5: EEG DESCRIPTION: No clear posterior dominant rhythm was seen. EEG showed continuous generalized and lateralized right hemisphere 3-6hz  theta-delta slowing. Hyperventilation and photic stimulation were not performed.  ABNORMALITY - Continuous slow, generalized and lateralized right hemisphere  IMPRESSION: This study is suggestive of cortical dysfunction in right hemisphere consistent with underlying infarct. Additionally, there is evidence of moderate diffuse encephalopathy. No seizures or epileptiform discharges were  seen throughout the recording.  Antimicrobials:  Cefazolin   Subjective: Wants water  Objective: Vitals:   08/16/19 0027 08/16/19 0400 08/16/19 0820 08/16/19 1127  BP: (!) 117/94 (!) 185/79 (!) 151/68 (!) 157/82  Pulse: 83 85 73 79  Resp: 18 20  20   Temp: 97.7 F (36.5 C) 98.5 F (36.9 C) 98.6 F (37 C) 97.9 F (36.6 C)  TempSrc: Axillary Oral Oral Oral  SpO2: 100% 100% 100% 100%  Weight:      Height:        Intake/Output Summary (Last 24 hours) at 08/16/2019 1331 Last data filed at 08/16/2019 0300 Gross per 24 hour  Intake 523.79 ml  Output 1300 ml  Net -776.21 ml   Filed Weights   08/14/19 0215 08/15/19 0319 08/15/19 1959  Weight: 103.9 kg 103.9 kg 103.4 kg    Examination:  General exam: Appears calm and comfortable Respiratory system: Rhonchi. Respiratory effort normal. Cardiovascular system: S1 & S2 heard, RRR. No murmurs, rubs, gallops or clicks. Gastrointestinal system: Abdomen is distended, soft and nontender. Normal bowel sounds heard. Central nervous system: Alert and oriented. Dysarthria.  Extremities: No edema. No calf tenderness Skin: No cyanosis. No rashes Psychiatry: Judgement and insight appear normal. Mood & affect appropriate.     Data Reviewed: I have personally reviewed following labs and imaging studies  CBC: Recent Labs  Lab 08/10/19 0316 08/14/19 0932 08/15/19 1051  WBC 7.0 7.2 6.6  HGB 12.5* 11.2* 10.9*  HCT 36.0*  36.5* 33.1* 32.9*  MCV 99.4 100.0 101.5*  PLT 174 194 [id number]   Basic Metabolic Panel: Recent Labs  Lab 08/12/19 0505 08/14/19 0428 08/15/19 1051  NA 138 139 142  K 4.3 4.2 4.1  CL 102 104 105  CO2 20* 24 30  GLUCOSE 89 180* 156*  BUN 12 17 16   CREATININE 1.24  1.11 1.15  CALCIUM 9.1 9.0 9.1  MG  --  1.7  --    GFR: Estimated Creatinine Clearance: 58.7 mL/min (by C-G formula based on SCr of 1.15 mg/dL). Liver Function Tests: Recent Labs  Lab 08/15/19 1051  AST 37  ALT 30  ALKPHOS 61  BILITOT 0.4   PROT 6.0*  ALBUMIN 2.6*   No results for input(s): LIPASE, AMYLASE in the last 168 hours. Recent Labs  Lab 08/15/19 1051  AMMONIA 19   Coagulation Profile: No results for input(s): INR, PROTIME in the last 168 hours. Cardiac Enzymes: No results for input(s): CKTOTAL, CKMB, CKMBINDEX, TROPONINI in the last 168 hours. BNP (last 3 results) No results for input(s): PROBNP in the last 8760 hours. HbA1C: No results for input(s): HGBA1C in the last 72 hours. CBG: Recent Labs  Lab 08/15/19 1946 08/15/19 2318 08/16/19 0326 08/16/19 0731 08/16/19 1126  GLUCAP 107* 79 96 96 99   Lipid Profile: No results for input(s): CHOL, HDL, LDLCALC, TRIG, CHOLHDL, LDLDIRECT in the last 72 hours. Thyroid Function Tests: Recent Labs    08/15/19 1051  TSH 1.076   Anemia Panel: No results for input(s): VITAMINB12, FOLATE, FERRITIN, TIBC, IRON, RETICCTPCT in the last 72 hours. Sepsis Labs: No results for input(s): PROCALCITON, LATICACIDVEN in the last 168 hours.  Recent Results (from the past 240 hour(s))  SARS CORONAVIRUS 2 (TAT 6-24 HRS) Nasopharyngeal Nasopharyngeal Swab     Status: None   Collection Time: 08/09/19 11:30 AM   Specimen: Nasopharyngeal Swab  Result Value Ref Range Status   SARS Coronavirus 2 NEGATIVE NEGATIVE Final    Comment: (NOTE) SARS-CoV-2 target nucleic acids are NOT DETECTED. The SARS-CoV-2 RNA is generally detectable in upper and lower respiratory specimens during the acute phase of infection. Negative results do not preclude SARS-CoV-2 infection, do not rule out co-infections with other pathogens, and should not be used as the sole basis for treatment or other patient management decisions. Negative results must be combined with clinical observations, patient history, and epidemiological information. The expected result is Negative. Fact Sheet for Patients: SugarRoll.be Fact Sheet for Healthcare  Providers: https://www.woods-mathews.com/ This test is not yet approved or cleared by the [place] FDA and  has been authorized for detection and/or diagnosis of SARS-CoV-2 by FDA under an Emergency Use Authorization (EUA). This EUA will remain  in effect (meaning this test can be used) for the duration of the COVID-19 declaration under Section 56 4(b)(1) of the Act, 21 U.S.C. section 360bbb-3(b)(1), unless the authorization is terminated or revoked sooner. Performed at [facility], [place] [address], [place], [place] [address]   Culture, blood (routine x 2)     Status: Abnormal (Preliminary result)   Collection Time: 08/14/19  9:32 AM   Specimen: BLOOD RIGHT HAND  Result Value Ref Range Status   Specimen Description BLOOD RIGHT HAND  Final   Special Requests   Final    BOTTLES DRAWN AEROBIC ONLY Blood Culture adequate volume   Culture  Setup Time   Final    AEROBIC BOTTLE ONLY GRAM POSITIVE COCCI CRITICAL VALUE NOTED.  VALUE IS CONSISTENT WITH PREVIOUSLY REPORTED AND CALLED VALUE. Performed at [facility], [place] [address] [place], [place] [address]    Culture STAPHYLOCOCCUS CAPITIS (A)  Final   Report Status PENDING  Incomplete  Culture, blood (routine x 2)     Status: Abnormal (Preliminary result)   Collection Time: 08/14/19  9:32 AM   Specimen: BLOOD RIGHT WRIST  Result Value Ref Range Status   Specimen Description BLOOD RIGHT WRIST  Final   Special Requests   Final    BOTTLES DRAWN AEROBIC ONLY Blood Culture adequate volume   Culture  Setup Time   Final    AEROBIC BOTTLE ONLY GRAM POSITIVE COCCI CRITICAL RESULT CALLED TO, READ BACK BY AND VERIFIED WITH: PHARMD C ROBERTSON [id number] AT 37 AN BY CM Performed at [facility], [place] [address] [place], [place] [address]    Culture STAPHYLOCOCCUS CAPITIS (A)  Final   Report Status PENDING  Incomplete  Blood Culture ID Panel (Reflexed)     Status: Abnormal   Collection Time: 08/14/19  9:32 AM   Result Value Ref Range Status   Enterococcus species NOT DETECTED NOT DETECTED Final   Listeria monocytogenes NOT DETECTED NOT DETECTED Final   Staphylococcus species DETECTED (A) NOT DETECTED Final    Comment: Methicillin (oxacillin) susceptible coagulase negative staphylococcus. Possible blood culture contaminant (unless isolated from more than one blood culture draw or clinical case suggests pathogenicity). No antibiotic treatment is indicated for blood  culture contaminants. CRITICAL RESULT CALLED TO, READ BACK BY AND VERIFIED WITH: PHARMD C ROBERTSON [id number] AT 625 AM BY CM    Staphylococcus aureus (BCID) NOT DETECTED NOT DETECTED Final   Methicillin resistance NOT DETECTED NOT DETECTED Final   Streptococcus species NOT DETECTED NOT DETECTED Final   Streptococcus agalactiae NOT DETECTED NOT DETECTED Final   Streptococcus pneumoniae NOT DETECTED NOT DETECTED Final   Streptococcus pyogenes NOT DETECTED NOT DETECTED Final   Acinetobacter baumannii NOT DETECTED NOT DETECTED Final   Enterobacteriaceae species NOT DETECTED NOT DETECTED Final   Enterobacter cloacae complex NOT DETECTED NOT DETECTED Final   Escherichia coli NOT DETECTED NOT DETECTED Final   Klebsiella oxytoca NOT DETECTED NOT DETECTED Final   Klebsiella pneumoniae NOT DETECTED NOT DETECTED Final   Proteus species NOT DETECTED NOT DETECTED Final   Serratia marcescens NOT DETECTED NOT DETECTED Final   Haemophilus influenzae NOT DETECTED NOT DETECTED Final   Neisseria meningitidis NOT DETECTED NOT DETECTED Final   Pseudomonas aeruginosa NOT DETECTED NOT DETECTED Final   Candida albicans NOT DETECTED NOT DETECTED Final   Candida glabrata NOT DETECTED NOT DETECTED Final   Candida krusei NOT DETECTED NOT DETECTED Final   Candida parapsilosis NOT DETECTED NOT DETECTED Final   Candida tropicalis NOT DETECTED NOT DETECTED Final    Comment: Performed at [facility], [place]. [address], [place], [place] [address]  Culture,  Urine     Status: Abnormal   Collection Time: 08/14/19  3:49 PM   Specimen: Urine, Clean Catch  Result Value Ref Range Status   Specimen Description URINE, CLEAN CATCH  Final   Special Requests   Final    NONE Performed at [facility], [place] [address], [place], [place] [address]    Culture MULTIPLE SPECIES PRESENT, SUGGEST RECOLLECTION (A)  Final   Report Status 08/15/2019 FINAL  Final  Culture, blood (routine x 2)     Status: None (Preliminary result)   Collection Time: 08/15/19  2:30 PM   Specimen: BLOOD  Result Value Ref Range Status   Specimen Description BLOOD RIGHT ANTECUBITAL  Final   Special Requests   Final    BOTTLES DRAWN AEROBIC AND ANAEROBIC Blood Culture adequate volume   Culture   Final    NO GROWTH < 24 HOURS Performed at [facility], [place] [address], [place], [place] [address]  Report Status PENDING  Incomplete  Culture, blood (routine x 2)     Status: None (Preliminary result)   Collection Time: 08/15/19  2:35 PM   Specimen: BLOOD  Result Value Ref Range Status   Specimen Description BLOOD LEFT ANTECUBITAL  Final   Special Requests   Final    BOTTLES DRAWN AEROBIC AND ANAEROBIC Blood Culture adequate volume   Culture   Final    NO GROWTH < 24 HOURS Performed at [facility], [address] [place], [place] [address]    Report Status PENDING  Incomplete         Radiology Studies: DG Abd Portable 1V  Result Date: 08/15/2019 CLINICAL DATA:  Enteric tube placement EXAM: PORTABLE ABDOMEN - 1 VIEW COMPARISON:  08/09/2019 FINDINGS: Feeding tube enters the stomach and turned to the right. Tip appears to be in the region of the duodenal bulb. Normal bowel gas pattern. IMPRESSION: Feeding tube tip in the region of the duodenal bulb. Electronically Signed   By: Franchot Gallo M.D.   On: 08/15/2019 15:33   Overnight EEG with video  Result Date: 08/15/2019 Lora Havens, MD     08/15/2019  1:51 PM Patient Name: DEVIAN TATA MRN:  [id number] Epilepsy Attending: Lora Havens Referring Physician/Provider: Dr Karena Addison rule Duration: 08/13/2018 1128 to 08/15/2019 1250  Patient history: 59 M with history of seziure who presented with stroke with and frequent left-sided twitching concerning for seizure. EEG to evaluate for seizure.  Level of alertness:  Awake, lethragic  AEDs during EEG study: LEV  Technical aspects: This EEG study was done with scalp electrodes positioned according to the 10-20 International system of electrode placement. Electrical activity was acquired at a sampling rate of 500Hz  and reviewed with a high frequency filter of 70Hz  and a low frequency filter of 1Hz . EEG data were recorded continuously and digitally stored.  DESCRIPTION:  During awake state, no clear posterior dominant rhythm is seen. EEG showed continuous generalized and lateralized right hemisphere low amplitude 3-5hz  theta-delta slowing. Hyperventilation and photic stimulation were not performed.  ABNORMALITY - Continuous slow, generalized and lateralized right hemisphere  IMPRESSION: This study is suggestive of cortical dysfunction in right hemisphere likely secondary to underlying infarct as well as severe diffuse encephalopathy, non specific to etiology. No seizures or epileptiform discharges were seen throughout the recording.  Priyanka Barbra Sarks        Scheduled Meds: .  stroke: mapping our early stages of recovery book   Does not apply Once  . aspirin  81 mg Per Tube Daily   Or  . aspirin  300 mg Rectal Daily  . atorvastatin  40 mg Per Tube Daily  . clopidogrel  75 mg Per Tube Daily  . feeding supplement (PRO-STAT SUGAR FREE 64)  30 mL Per Tube Daily  . metoprolol tartrate  5 mg Intravenous Q6H  . tamsulosin  0.4 mg Oral Daily   Continuous Infusions: .  ceFAZolin (ANCEF) IV 2 g (08/16/19 0820)  . feeding supplement (JEVITY 1.2 CAL) Stopped (08/15/19 2324)  . levETIRAcetam 750 mg (08/16/19 1043)     LOS: 7 days     Cordelia Poche, MD [facility] 08/16/2019, 1:31 PM  If 7PM-7AM, please contact night-coverage [email or web address]

## 2019-08-16 NOTE — Progress Notes (Signed)
Cortrak Tube Team Note:  Consult received to unclog Cortrak feeding tube.   Discussed with RN. Cortrak feeding tube has been clogged since 1/11. Per review of chart unclog protocol was attempted but was not successful. Found Cortrak tube in left nare at 63 cm. Unclipped tube and inserted tracer to find that clog was at cm marking near clip. Manually removed clog and tube was able to flush well. Traced tube and it still terminates in stomach. Clipped at 62 cm now still in left nare.  No x-ray is required. RN may begin using tube.   If the tube becomes dislodged please keep the tube and contact the Cortrak team at [email or web address] (password [email or web address]) for replacement.  If after hours and replacement cannot be delayed, place a NG tube and confirm placement with an abdominal x-ray.    Jacklynn Barnacle, MS, RD, LDN Office: [phone] Pager: [phone] After Hours/Weekend Pager: [phone]

## 2019-08-16 NOTE — Progress Notes (Signed)
Patient ID: Andres [name], male   DOB: Jan 24, 1941, 79 y.o.   MRN: [id number]         [facility]  Date of Admission:  08/09/2019           Day 2 cefazolin ASSESSMENT: He is afebrile on therapy for possible coagulase-negative staph bacteremia.  Both isolates are Staph capitis antibiotic susceptibility results for both isolates are still pending to let me know if this is likely to be true bacteremia or to contaminated sepsis.  PLAN: 1. Continue cefazolin pending culture results  Principal Problem:   Acute ischemic stroke [facility]) Active Problems:   Positive blood cultures   Hyperlipidemia   Essential hypertension   BPH (benign prostatic hyperplasia)   CAD (coronary artery disease), hx of CABG 1997 X 6. Last nuc 2012 negative.   Morbid obesity ([place])   Transaminitis   History of seizures   Scheduled Meds: .  stroke: mapping our early stages of recovery book   Does not apply Once  . aspirin  81 mg Per Tube Daily   Or  . aspirin  300 mg Rectal Daily  . atorvastatin  40 mg Per Tube Daily  . clopidogrel  75 mg Per Tube Daily  . feeding supplement (PRO-STAT SUGAR FREE 64)  30 mL Per Tube Daily  . metoprolol tartrate  5 mg Intravenous Q6H  . tamsulosin  0.4 mg Oral Daily   Continuous Infusions: .  ceFAZolin (ANCEF) IV 2 g (08/16/19 0820)  . feeding supplement (JEVITY 1.2 CAL) Stopped (08/15/19 2324)  . levETIRAcetam 750 mg (08/16/19 1043)   PRN Meds:.acetaminophen **OR** acetaminophen (TYLENOL) oral liquid 160 mg/5 mL **OR** acetaminophen, diclofenac sodium, LORazepam, polyethylene glycol  Review of Systems: Review of Systems  Unable to perform ROS: Mental acuity    No Known Allergies  OBJECTIVE: Vitals:   08/16/19 0027 08/16/19 0400 08/16/19 0820 08/16/19 1127  BP: (!) 117/94 (!) 185/79 (!) 151/68 (!) 157/82  Pulse: 83 85 73 79  Resp: 18 20  20   Temp: 97.7 F (36.5 C) 98.5 F (36.9 C) 98.6 F (37 C) 97.9 F (36.6 C)  TempSrc: Axillary  Oral Oral Oral  SpO2: 100% 100% 100% 100%  Weight:      Height:       Body mass index is 35.7 kg/m.  Physical Exam Constitutional:      Comments: He is laying quietly in bed.  His eyes are open but he does not respond to questions.  Cardiovascular:     Rate and Rhythm: Normal rate and regular rhythm.     Heart sounds: No murmur.  Pulmonary:     Effort: Pulmonary effort is normal.     Breath sounds: Normal breath sounds.     Lab Results Lab Results  Component Value Date   WBC 6.6 08/15/2019   HGB 10.9 (L) 08/15/2019   HCT 32.9 (L) 08/15/2019   MCV 101.5 (H) 08/15/2019   PLT 214 08/15/2019    Lab Results  Component Value Date   CREATININE 1.15 08/15/2019   BUN 16 08/15/2019   NA 142 08/15/2019   K 4.1 08/15/2019   CL 105 08/15/2019   CO2 30 08/15/2019    Lab Results  Component Value Date   ALT 30 08/15/2019   AST 37 08/15/2019   ALKPHOS 61 08/15/2019   BILITOT 0.4 08/15/2019     Microbiology: Recent Results (from the past 240 hour(s))  SARS CORONAVIRUS 2 (TAT 6-24 HRS) Nasopharyngeal Nasopharyngeal Swab  Status: None   Collection Time: 08/09/19 11:30 AM   Specimen: Nasopharyngeal Swab  Result Value Ref Range Status   SARS Coronavirus 2 NEGATIVE NEGATIVE Final    Comment: (NOTE) SARS-CoV-2 target nucleic acids are NOT DETECTED. The SARS-CoV-2 RNA is generally detectable in upper and lower respiratory specimens during the acute phase of infection. Negative results do not preclude SARS-CoV-2 infection, do not rule out co-infections with other pathogens, and should not be used as the sole basis for treatment or other patient management decisions. Negative results must be combined with clinical observations, patient history, and epidemiological information. The expected result is Negative. Fact Sheet for Patients: SugarRoll.be Fact Sheet for Healthcare Providers: https://www.woods-mathews.com/ This test is not yet  approved or cleared by the [place] FDA and  has been authorized for detection and/or diagnosis of SARS-CoV-2 by FDA under an Emergency Use Authorization (EUA). This EUA will remain  in effect (meaning this test can be used) for the duration of the COVID-19 declaration under Section 56 4(b)(1) of the Act, 21 U.S.C. section 360bbb-3(b)(1), unless the authorization is terminated or revoked sooner. Performed at [facility], [place] [address] [place], [place] [address]   Culture, blood (routine x 2)     Status: Abnormal (Preliminary result)   Collection Time: 08/14/19  9:32 AM   Specimen: BLOOD RIGHT HAND  Result Value Ref Range Status   Specimen Description BLOOD RIGHT HAND  Final   Special Requests   Final    BOTTLES DRAWN AEROBIC ONLY Blood Culture adequate volume   Culture  Setup Time   Final    AEROBIC BOTTLE ONLY GRAM POSITIVE COCCI CRITICAL VALUE NOTED.  VALUE IS CONSISTENT WITH PREVIOUSLY REPORTED AND CALLED VALUE. Performed at [facility], [place] [address], [place], [place] [address]    Culture STAPHYLOCOCCUS CAPITIS (A)  Final   Report Status PENDING  Incomplete  Culture, blood (routine x 2)     Status: Abnormal (Preliminary result)   Collection Time: 08/14/19  9:32 AM   Specimen: BLOOD RIGHT WRIST  Result Value Ref Range Status   Specimen Description BLOOD RIGHT WRIST  Final   Special Requests   Final    BOTTLES DRAWN AEROBIC ONLY Blood Culture adequate volume   Culture  Setup Time   Final    AEROBIC BOTTLE ONLY GRAM POSITIVE COCCI CRITICAL RESULT CALLED TO, READ BACK BY AND VERIFIED WITH: PHARMD C ROBERTSON [id number] AT 622 AN BY CM Performed at [facility], [place] [address], [place], [place] [address]    Culture STAPHYLOCOCCUS CAPITIS (A)  Final   Report Status PENDING  Incomplete  Blood Culture ID Panel (Reflexed)     Status: Abnormal   Collection Time: 08/14/19  9:32 AM  Result Value Ref Range Status   Enterococcus species NOT DETECTED NOT DETECTED  Final   Listeria monocytogenes NOT DETECTED NOT DETECTED Final   Staphylococcus species DETECTED (A) NOT DETECTED Final    Comment: Methicillin (oxacillin) susceptible coagulase negative staphylococcus. Possible blood culture contaminant (unless isolated from more than one blood culture draw or clinical case suggests pathogenicity). No antibiotic treatment is indicated for blood  culture contaminants. CRITICAL RESULT CALLED TO, READ BACK BY AND VERIFIED WITH: PHARMD C ROBERTSON [id number] AT 625 AM BY CM    Staphylococcus aureus (BCID) NOT DETECTED NOT DETECTED Final   Methicillin resistance NOT DETECTED NOT DETECTED Final   Streptococcus species NOT DETECTED NOT DETECTED Final   Streptococcus agalactiae NOT DETECTED NOT DETECTED  Final   Streptococcus pneumoniae NOT DETECTED NOT DETECTED Final   Streptococcus pyogenes NOT DETECTED NOT DETECTED Final   Acinetobacter baumannii NOT DETECTED NOT DETECTED Final   Enterobacteriaceae species NOT DETECTED NOT DETECTED Final   Enterobacter cloacae complex NOT DETECTED NOT DETECTED Final   Escherichia coli NOT DETECTED NOT DETECTED Final   Klebsiella oxytoca NOT DETECTED NOT DETECTED Final   Klebsiella pneumoniae NOT DETECTED NOT DETECTED Final   Proteus species NOT DETECTED NOT DETECTED Final   Serratia marcescens NOT DETECTED NOT DETECTED Final   Haemophilus influenzae NOT DETECTED NOT DETECTED Final   Neisseria meningitidis NOT DETECTED NOT DETECTED Final   Pseudomonas aeruginosa NOT DETECTED NOT DETECTED Final   Candida albicans NOT DETECTED NOT DETECTED Final   Candida glabrata NOT DETECTED NOT DETECTED Final   Candida krusei NOT DETECTED NOT DETECTED Final   Candida parapsilosis NOT DETECTED NOT DETECTED Final   Candida tropicalis NOT DETECTED NOT DETECTED Final    Comment: Performed at [facility], [place] [address], [place], [place] [address]  Culture, Urine     Status: Abnormal   Collection Time: 08/14/19  3:49 PM   Specimen: Urine,  Clean Catch  Result Value Ref Range Status   Specimen Description URINE, CLEAN CATCH  Final   Special Requests   Final    NONE Performed at [facility], [place] [address], [place], [place] [address]    Culture MULTIPLE SPECIES PRESENT, SUGGEST RECOLLECTION (A)  Final   Report Status 08/15/2019 FINAL  Final    Michel Bickers, MD [place] [facility] [place] [phone] pager   [phone] cell 08/16/2019, 1:24 PM

## 2019-08-16 NOTE — H&P (Signed)
Physical Medicine and Rehabilitation Admission H&P    Chief Complaint  Patient presents with  . Code Stroke  : HPI: Andres [name] is an 79 year old right-handed male with history of hypertension, CKD stage III, morbid obesity with BMI 35.70, hyperlipidemia, history of seizure maintained on Keppra, CAD status post CABG, history of CVA maintained on aspirin 81 mg daily and Plavix, carotid artery stenosis, mitral valve prolapse, diet-controlled diabetes mellitus, BPH and remote tobacco abuse.  History taken from chart review and therapist due to cognition.  Patient lives with son.  Reportedly independent prior to admission using a single-point cane.  Presented on 08/09/2019 on the floor with left-sided weakness and slurred speech.  Cranial CT revealed hyperdense right MCA, distal M1 and proximal M2 segments.  No acute hemorrhage.  Patient did not receive TPA.  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  Carotid and aortic atherosclerosis without stenosis.  MRI showed large area of acute infarction right MCA territory surrounding chronic infarct in the right parietal lobe.  Echocardiogram with ejection fraction of 60% without emboli.  EEG negative for seizure.  Admission chemistries with creatinine 1.35, SARS coronavirus negative, hemoglobin 13.8.  Neurology follow-up currently remains on aspirin and Plavix therapy for CVA prophylaxis.  Subcutaneous Lovenox for DVT prophylaxis.  Keppra ongoing as prior to admission for history of seizure disorder.  Blood cultures did show coagulase-negative staph question contaminant and remained on cefazolin per infectious disease with repeat blood cultures with no growth to date and no plan for TEE with recommendations to complete Ancef 08/19/2019.  Patient currently remains n.p.o. with alternative means of nutritional support.  Therapy evaluations completed and patient was admitted for a comprehensive  rehab program.  Please see preadmission assessment from earlier today as well.  Review of Systems  Unable to perform ROS: Mental acuity   Past Medical History:  Diagnosis Date  . ALLERGIC RHINITIS   . Benign prostatic hypertrophy   . 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 Last nuc 2012 negative. 07/21/2013  . Carpal tunnel syndrome, bilateral   . Cataracts, bilateral   . Coronary artery disease    Dr. Claiborne Billings; 2D ECHO, 12/11/2011 - EF 50-55%, normal; NUCLEAR STRESS TEST, 10/16/2010 - no evidemce of inducible ischemia  . Diabetes mellitus type II   . Diverticulosis of colon   . GERD (gastroesophageal reflux disease)   . H/O hiatal hernia   . Head mass   . Hepatitis   . Hx of   sessile serrated colonic polyp 02/27/2015  . Hyperlipidemia   . Hypertension   . Inguinal hernia, Rt reduced, Lt present 07/21/2013  . Low back pain   . Mitral valve prolapse 11/24/2013   By echo dec 2014  . Nephrolithiasis   . Obesity, morbid ([place])   . Occlusion and stenosis of carotid artery without mention of cerebral infarction    CAROTID DOPPLER, 03/18/2012 - srable, mild, hard, plaque noted, bilaterally  . Stroke [facility])    Past Surgical History:  Procedure Laterality Date  . APPENDECTOMY    . CARDIAC CATHETERIZATION    . CATARACT EXTRACTION Right   . CORONARY ARTERY BYPASS GRAFT    . INGUINAL HERNIA REPAIR Right 07/26/2013   Procedure: REPAIR  INCARCERATED RIGHT INGUINAL  HERNIA ;  Surgeon: Gwenyth Ober, MD;  Location: [facility];  Service: General;  Laterality: Right;   Family History  Problem Relation Age of  Onset  . Heart attack Mother   . Diabetes Mother   . Heart attack Brother   . Heart attack Maternal Grandmother   . Hypertension Maternal Grandmother   . Pancreatic cancer Brother   . Kidney disease Brother   . Hypertension Son   . Kidney disease Son        ?  Marland Kitchen Alzheimer's disease Father   . Diabetes Brother   . Diabetes Sister    Social History:  reports that he quit  smoking about 29 years ago. His smoking use included cigarettes. He has never used smokeless tobacco. He reports that he does not drink alcohol or use drugs. Allergies: No Known Allergies Medications Prior to Admission  Medication Sig Dispense Refill  . Acetaminophen 500 MG coapsule Take 500 mg by mouth every 6 (six) hours as needed for mild pain. Reported on 07/21/2015    . Ascorbic Acid (VITAMIN C) 1000 MG tablet Take 1,000 mg by mouth daily.    . Aspirin (ADULT ASPIRIN LOW STRENGTH) 81 MG EC tablet Take 81 mg by mouth daily.      Marland Kitchen atorvastatin (LIPITOR) 40 MG tablet Take 1 tablet (40 mg total) by mouth daily. (Patient taking differently: Take 40 mg by mouth daily at 6 PM. ) 90 tablet 3  . B Complex Vitamins (VITAMIN B-COMPLEX PO) Take 1 tablet by mouth daily.    . clopidogrel (PLAVIX) 75 MG tablet Take 1 tablet (75 mg total) by mouth daily. 90 tablet 3  . hydrochlorothiazide (MICROZIDE) 12.5 MG capsule Take 1 capsule (12.5 mg total) by mouth daily. 90 capsule 3  . levETIRAcetam (KEPPRA) 500 MG tablet Take 1 tablet (500 mg total) by mouth 2 (two) times daily. 180 tablet 3  . losartan (COZAAR) 50 MG tablet Take 1 tablet (50 mg total) by mouth daily. 90 tablet 3  . meloxicam (MOBIC) 7.5 MG tablet Take 1 tablet (7.5 mg total) by mouth daily. (Patient taking differently: Take 7.5 mg by mouth daily as needed for pain. ) 30 tablet 0  . Multiple Vitamin (MULTIVITAMIN) capsule Take 1 capsule by mouth daily.     . Omega-3 Fatty Acids (OMEGA 3 PO) Take 360 mg by mouth 2 (two) times daily.    Marland Kitchen VITAMIN D PO Take 1 tablet by mouth daily.    . [DISCONTINUED] metoprolol tartrate (LOPRESSOR) 50 MG tablet Take 1 tablet (50 mg total) by mouth 2 (two) times daily. 180 tablet 3  . [DISCONTINUED] tamsulosin (FLOMAX) 0.4 MG CAPS capsule Take 1 capsule (0.4 mg total) by mouth daily. 90 capsule 3  . diclofenac sodium (VOLTAREN) 1 % GEL Apply 2 g topically every 6 (six) hours as needed (for arthritis). (Patient not  taking: Reported on [id number]) [id number] g 3  . folic acid (FOLVITE) 1 MG tablet Take 1 tablet (1 mg total) by mouth daily. (Patient not taking: Reported on 08/10/2019) 90 tablet 3    Drug Regimen Review Drug regimen was reviewed and remains appropriate with no significant issues identified  Home: Home Living Family/patient expects to be discharged to:: Private residence Living Arrangements: Alone Available Help at Discharge: Family, Available 24 hours/day Type of Home: House Home Access: Stairs to enter [employer] of Steps: 2 Entrance Stairs-Rails: None Home Layout: One level Bathroom Shower/Tub: Tub/shower unit, Architectural technologist: Standard Bathroom Accessibility: Yes Home Equipment: Cane - single point Additional Comments: son live with pt pta  Lives With: Son   Functional History: Prior Function Level of Independence: Independent Comments: walking  in neighborhood to get to church  Functional Status:  Mobility: Bed Mobility Overal bed mobility: Needs Assistance Bed Mobility: Rolling Rolling: (Pt in R sidelying.) Sidelying to sit: Total assist Sit to supine: Max assist, +2 for physical assistance General bed mobility comments: Total assistance to mobilize to edge of bed.  He presents with involuntary myclonic jerking of LLE and keep transitioning it back to bed.  Total assistance to elevate trunk into a seated position.  He was able to manage sitting balance with use of RUE but at times required mod-max assistace to correct balance and move back to midline. Transfers Overall transfer level: Needs assistance Equipment used: Ambulation equipment used(sara stedy) Transfers: Sit to/from Stand Sit to Stand: Mod assist General transfer comment: Pt more alert but at times looses focus.  When focused he was able to achieve standing x2 bouts and maintain standing for 3-4 min.  Moderate assistance to weight shift forward and boost into a standing position.  Hand over hand  on R to maintain hold to sara stedy. Ambulation/Gait Ambulation/Gait assistance: (NT) General Gait Details: unable    ADL: ADL Overall ADL's : Needs assistance/impaired Eating/Feeding: NPO Grooming: Moderate assistance Upper Body Bathing: Maximal assistance, Sitting, Bed level Lower Body Bathing: Maximal assistance, +2 for physical assistance, Sit to/from stand, Bed level Upper Body Dressing : Maximal assistance, Sitting, Bed level Lower Body Dressing: Maximal assistance, Sit to/from stand, Bed level Toilet Transfer: Maximal assistance, +2 for physical assistance(simulated at EOB) Toileting- Clothing Manipulation and Hygiene: Total assistance, +2 for physical assistance, Bed level Toileting - Clothing Manipulation Details (indicate cue type and reason): Pt requiring Total A for peri care after urinary incontience in the bed. Pt with deceased following of commands for rolling in bed.  Functional mobility during ADLs: Total assistance(bed mobility only) General ADL Comments: Pt presenting with decreased arousal and significant lethargy. Pt with urinary incontience in bed and requiring Total A for bed mobility and peri care. Repositioning in bed and placing bed in chair position. Pt occasionally opening eyes to his name.   Cognition: Cognition Overall Cognitive Status: Impaired/Different from baseline Arousal/Alertness: Lethargic Orientation Level: Oriented to person Attention: Sustained Sustained Attention: Impaired Cognition Arousal/Alertness: Awake/alert Behavior During Therapy: Flat affect Overall Cognitive Status: Impaired/Different from baseline Area of Impairment: Orientation, Attention, Memory, Following commands, Safety/judgement, Awareness, Problem solving Orientation Level: Disoriented to, Place, Time, Situation Current Attention Level: Focused Memory: Decreased short-term memory Following Commands: Follows one step commands inconsistently, Follows one step commands with  increased time Safety/Judgement: Decreased awareness of safety, Decreased awareness of deficits Awareness: Intellectual Problem Solving: Slow processing, Difficulty sequencing, Requires verbal cues, Decreased initiation, Requires tactile cues General Comments: Pt performed mobility with increased effort.  He has moments where he looses focus and requires increased time to rengage in activity.  Physical Exam: Blood pressure (!) 144/101, pulse 90, temperature 98.1 F (36.7 C), temperature source Oral, resp. rate 18, height 5\' 7"  (1.702 m), weight 105 kg, SpO2 100 %. Physical Exam  Vitals reviewed. Constitutional: He appears well-developed.  Morbidly obese  HENT:  Head: Normocephalic and atraumatic.  + NG  Eyes: Right eye exhibits no discharge. Left eye exhibits no discharge. No scleral icterus.  Neck: No tracheal deviation present. No thyromegaly present.  Respiratory: Effort normal. No respiratory distress.  GI: Soft.  Musculoskeletal:     Comments: Lower extremity edema  Neurological: He is alert.  Dysarthria Global aphasia Follows simple commands.   Patient weakness Motor: Limited by ability to follow commands, however moving  right side freely No movement noted in left upper extremity Left lower extremity moving hip and knees  Skin: Skin is warm and dry.  Psychiatric: He is slowed.  Unable to assess due to cognition    Results for orders placed or performed during the hospital encounter of 08/09/19 (from the past 48 hour(s))  Glucose, capillary     Status: Abnormal   Collection Time: 08/17/19  4:19 PM  Result Value Ref Range   Glucose-Capillary 168 (H) 70 - 99 mg/dL  Glucose, capillary     Status: Abnormal   Collection Time: 08/17/19  9:15 PM  Result Value Ref Range   Glucose-Capillary 150 (H) 70 - 99 mg/dL  Glucose, capillary     Status: Abnormal   Collection Time: 08/18/19 12:15 AM  Result Value Ref Range   Glucose-Capillary 138 (H) 70 - 99 mg/dL  CBC with  Differential/Platelet     Status: Abnormal   Collection Time: 08/18/19  3:29 AM  Result Value Ref Range   WBC 5.8 4.0 - 10.5 K/uL   RBC 3.67 (L) 4.22 - 5.81 MIL/uL   Hemoglobin 12.3 (L) 13.0 - 17.0 g/dL   HCT 36.3 (L) 39.0 - 52.0 %   MCV 98.9 80.0 - 100.0 fL   MCH 33.5 26.0 - 34.0 pg   MCHC 33.9 30.0 - 36.0 g/dL   RDW 11.9 11.5 - 15.5 %   Platelets 302 150 - 400 K/uL   nRBC 0.0 0.0 - 0.2 %   Neutrophils Relative % 61 %   Neutro Abs 3.5 1.7 - 7.7 K/uL   Lymphocytes Relative 27 %   Lymphs Abs 1.6 0.7 - 4.0 K/uL   Monocytes Relative 9 %   Monocytes Absolute 0.5 0.1 - 1.0 K/uL   Eosinophils Relative 2 %   Eosinophils Absolute 0.1 0.0 - 0.5 K/uL   Basophils Relative 0 %   Basophils Absolute 0.0 0.0 - 0.1 K/uL   Immature Granulocytes 1 %   Abs Immature Granulocytes 0.08 (H) 0.00 - 0.07 K/uL    Comment: Performed at [facility], [address] [place], [place] [id number]  Basic metabolic panel     Status: Abnormal   Collection Time: 08/18/19  3:29 AM  Result Value Ref Range   Sodium 139 135 - 145 mmol/L   Potassium 4.7 3.5 - 5.1 mmol/L   Chloride 101 98 - 111 mmol/L   CO2 27 22 - 32 mmol/L   Glucose, Bld 156 (H) 70 - 99 mg/dL   BUN 22 8 - 23 mg/dL   Creatinine, Ser 1.16 0.61 - 1.24 mg/dL   Calcium 9.5 8.9 - 10.3 mg/dL   GFR calc non Af Amer 59 (L) >60 mL/min   GFR calc Af Amer >60 >60 mL/min   Anion gap 11 5 - 15    Comment: Performed at [facility] [address] [place], [place] [address]  Magnesium     Status: None   Collection Time: 08/18/19  3:29 AM  Result Value Ref Range   Magnesium 1.9 1.7 - 2.4 mg/dL    Comment: Performed at [facility] [address], [place], [place] [address]  Glucose, capillary     Status: Abnormal   Collection Time: 08/18/19  3:38 AM  Result Value Ref Range   Glucose-Capillary 159 (H) 70 - 99 mg/dL  Glucose, capillary     Status: Abnormal   Collection Time: 08/18/19  8:25 AM  Result Value Ref Range  Glucose-Capillary 161 (H) 70 - 99 mg/dL  Glucose, capillary     Status: Abnormal   Collection Time: 08/18/19 12:06 PM  Result Value Ref Range   Glucose-Capillary 113 (H) 70 - 99 mg/dL  Glucose, capillary     Status: Abnormal   Collection Time: 08/18/19  4:51 PM  Result Value Ref Range   Glucose-Capillary 141 (H) 70 - 99 mg/dL  Glucose, capillary     Status: Abnormal   Collection Time: 08/18/19  7:18 PM  Result Value Ref Range   Glucose-Capillary 120 (H) 70 - 99 mg/dL   Comment 1 Notify RN    Comment 2 Document in Chart   Glucose, capillary     Status: Abnormal   Collection Time: 08/19/19 12:48 AM  Result Value Ref Range   Glucose-Capillary 174 (H) 70 - 99 mg/dL  Glucose, capillary     Status: Abnormal   Collection Time: 08/19/19  4:21 AM  Result Value Ref Range   Glucose-Capillary 139 (H) 70 - 99 mg/dL  Glucose, capillary     Status: Abnormal   Collection Time: 08/19/19  7:45 AM  Result Value Ref Range   Glucose-Capillary 152 (H) 70 - 99 mg/dL  Glucose, capillary     Status: Abnormal   Collection Time: 08/19/19 11:10 AM  Result Value Ref Range   Glucose-Capillary 145 (H) 70 - 99 mg/dL   No results found.     Medical Problem List and Plan: 1.  Left side weakness with dysarthria and dysphagia secondary to acute right MCA infarction  -patient may may shower  -ELOS/Goals: 20-25 days/Min/Mod A  Admit to CIR 2.  Antithrombotics: -DVT/anticoagulation: Lovenox  -antiplatelet therapy: Aspirin 81 mg daily, Plavix 75 mg daily x3 months then Plavix alone 3. Pain Management: Voltaren gel as needed 4. Mood: Provide emotional support  -antipsychotic agents: N/A 5. Neuropsych: This patient is not capable of making decisions on his own behalf. 6. Skin/Wound Care: Routine skin checks 7. Fluids/Electrolytes/Nutrition: Routine in and outs.  CMP ordered 8.  Post stroke dysphagia.     Nasogastric tube feeds.    Advance diet as tolerated 9.  Seizure disorder.  Continue Keppra 750 mg  every 12 hours, EEG negative 10.  Essential hypertension.  Lopressor 25 mg twice daily  Monitor with increased mobility 11.  Diet-controlled diabetes mellitus.  Hemoglobin A1c 6.2.  Monitoring with mobility 12.  CKD stage III.    CMP ordered 13.  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  Latest blood cultures question contaminant per infectious disease.  Ancef completed 08/19/2019.  Repeat blood culture no growth to date 14.  CAD with history of CABG 1997.  Continue aspirin and Plavix 15.  Hyperlipidemia: Continue Lipitor 16.  BPH.  Flomax 0.4 mg daily.    Check PVRs 17.  Morbid obesity.  BMI 35.70.  Dietary follow-up  Cathlyn Parsons, PA-C 08/19/2019  I have personally performed a face to face diagnostic evaluation, including, but not limited to relevant history and physical exam findings, of this patient and developed relevant assessment and plan.  Additionally, I have reviewed and concur with the physician assistant's documentation above.  Delice Lesch, MD, ABPMR

## 2019-08-16 NOTE — Progress Notes (Signed)
Reason for consult: Stroke, seizure  Subjective: No acute events overnight.  Patient appears to be more awake today.   ROS: Unable to obtain due to poor mental status  Examination  Vital signs in last 24 hours: Temp:  [97.7 F (36.5 C)-98.6 F (37 C)] 98.6 F (37 C) (01/12 0820) Pulse Rate:  [70-85] 73 (01/12 0820) Resp:  [18-20] 20 (01/12 0400) BP: (117-185)/(66-94) 151/68 (01/12 0820) SpO2:  [97 %-100 %] 100 % (01/12 0820) Weight:  [103.4 kg] 103.4 kg (01/11 1959)  General: lying in bed, not in apparent distress CVS: pulse-normal rate and rhythm RS: breathing comfortably Extremities: normal   Neuro: MS: opens eyes to repeated verbal stimuli, able to tell me his name and that he is in the hospital, does not know the name of the hospital and year, intermittently follows simple one-step commands like squeezing hand but not consistently. CN: pupils equal and reactive, right gaze preference, left facial droop, left-sided neglect, unable to assess rest of the cranial nerves due to patient being very drowsy  Motor: Antigravity strength in all 4 extremities ( R>L)  Basic Metabolic Panel: Recent Labs  Lab 08/12/19 0505 08/14/19 0428 08/15/19 1051  NA 138 139 142  K 4.3 4.2 4.1  CL 102 104 105  CO2 20* 24 30  GLUCOSE 89 180* 156*  BUN 12 17 16   CREATININE 1.24 1.11 1.15  CALCIUM 9.1 9.0 9.1  MG  --  1.7  --     CBC: Recent Labs  Lab 08/10/19 0316 08/14/19 0932 08/15/19 1051  WBC 7.0 7.2 6.6  HGB 12.5* 11.2* 10.9*  HCT 36.0*  36.5* 33.1* 32.9*  MCV 99.4 100.0 101.5*  PLT 174 194 214     Coagulation Studies: No results for input(s): LABPROT, INR in the last 72 hours.  Imaging MRI brain without contrast 08/09/2019: Large acute infarct in the right MCA territory of surrounding the chronic infarct in the right parietal lobe.  CT head without contrast 08/14/2019: Progression of right MCA territory infarct, extending further into the insula and frontal operculum  without hemorrhage or herniation.   ASSESSMENT AND PLAN: 79 year old male who initially presented on 08/09/2019 for confusion and left-sided weakness.  CT head showed acute infarct however patient was not a candidate for TPA or thrombectomy.  On 08/13/2018 around midnight, nurse noticed frequent left-sided twitching.  Dr. Leonel Ramsay evaluated the patient and started him on Keppra.  Patient was also found to have coagulase-negative staph bacteremia and is on Ancef for the same.  Acute infarct Epilepsy Coagulase-negative staph bacteremia Acute encephalopathy ( Improving) -Patient presented with acute stroke.  He was then noted to have focal motor seizures on 08/13/2018.  Has since also been diagnosed with bacteremia.  - Patient currently encephalopathic, likely secondary to stroke and bacteremia  Recommendations -Continue Keppra 750 mg twice daily. -Continue aspirin, Plavix and atorvastatin for secondary stroke prevention -Minimize use of sedating medications, okay to use Ativan 1 mg only for generalized tonic-clonic seizure lasting more than 2 minutes or focal seizure lasting more than 5 minutes -Management of bacteremia and other comorbidities per primary team - PT/OT for stroke rehab  Thank you for allowing [place] to participate in the care of this patient.  Neurology will follow peripherally.  Please page neuro hospitalist for any further questions after 5 PM.  I have spent a total of25 minuteswith the patient reviewing hospitalnotes,  test results, labs and examining the patient as well as establishing an assessment and plan that was discussed  personally with the patient and team.>50% of time was spent in direct patient care.

## 2019-08-16 NOTE — Progress Notes (Signed)
Physical Therapy Treatment Patient Details Name: Andres [name] MRN: [id number] DOB: [birth year] Today's Date: 08/16/2019    History of Present Illness 79 yo male from home found laying on floor with incomprehensiable speech, L side weakness and not follow commands. Pt noted to be incontinent of urine.  R gaze preference and L side hemiparesis  CT (+) Hyperdense right middle cerebral artery distal M1 and proximal M2segments.PMH CAD with CABG 1997 DM HTn diverticulosis hiatal hernia head mass hepatitis lowe backpain mitral valve prolapse obese CVA hx smoking 29 years    PT Comments    Pt very lethargic during session today. Was soaked of urine upon entry. Rolled multiple times in bed for clean up and change of linens/ gown. Opened eyes to name but otherwise kept them closed. Attempted to verbalize a few times but speech not intelligible. Pt assisted with mobility at times by grasping rail but at other times physically resisted mobility. Moved all 4 extremities during session but not in a functional way. Expect he will need a longer course of rehab than CIR, changing rec to SNF. PT will continue to follow.    Follow Up Recommendations  Supervision/Assistance - 24 hour;SNF     Equipment Recommendations  Other (comment)(TBA)    Recommendations for Other Services       Precautions / Restrictions Precautions Precautions: Fall Restrictions Weight Bearing Restrictions: No    Mobility  Bed Mobility Overal bed mobility: Needs Assistance Bed Mobility: Rolling Rolling: Total assist;+2 for physical assistance         General bed mobility comments: Pt requiring Total A +2 for rolling in bed during toileting as pt with urinary incontience. Requiring Total A +2 for repositioning. Pt actively moving all 4 extremities, esp LLE and RUE, but not in an assistive way, sometimes resistive.    Transfers                 General transfer comment: not alert enough to attempt  transfers  Ambulation/Gait             General Gait Details: unable   Stairs             Wheelchair Mobility    Modified Rankin (Stroke Patients Only) Modified Rankin (Stroke Patients Only) Pre-Morbid Rankin Score: No symptoms Modified Rankin: Severe disability     Balance                                            Cognition Arousal/Alertness: Lethargic Behavior During Therapy: Flat affect Overall Cognitive Status: Impaired/Different from baseline Area of Impairment: Orientation;Attention;Memory;Following commands;Safety/judgement;Awareness;Problem solving                 Orientation Level: Disoriented to;Place;Time;Situation Current Attention Level: Focused Memory: Decreased short-term memory Following Commands: Follows one step commands inconsistently;Follows one step commands with increased time Safety/Judgement: Decreased awareness of safety;Decreased awareness of deficits Awareness: Intellectual Problem Solving: Slow processing;Difficulty sequencing;Requires verbal cues;Decreased initiation;Requires tactile cues General Comments: Very lethargic and keeping his eye closed. Occasionally opening eyes to name. Very drowsy.       Exercises      General Comments General comments (skin integrity, edema, etc.): needed max A to maintain SL each side, did hold onto rail in SL. HR 90 bpm. Increased swelling noted in scrotum, sling fabricated by OT for comfort      Pertinent Vitals/Pain Pain Assessment: Faces  Faces Pain Scale: No hurt Pain Intervention(s): Monitored during session    Home Living                      Prior Function            PT Goals (current goals can now be found in the care plan section) Acute Rehab PT Goals Patient Stated Goal: Unstated PT Goal Formulation: Patient unable to participate in goal setting Time For Goal Achievement: 08/25/19 Potential to Achieve Goals: Fair Progress towards PT goals:  Not progressing toward goals - comment(lethargy)    Frequency    Min 3X/week      PT Plan Discharge plan needs to be updated;Frequency needs to be updated    Co-evaluation PT/OT/SLP Co-Evaluation/Treatment: Yes Reason for Co-Treatment: Complexity of the patient's impairments (multi-system involvement);Necessary to address cognition/behavior during functional activity;For patient/therapist safety PT goals addressed during session: Mobility/safety with mobility OT goals addressed during session: ADL's and self-care      AM-PAC PT "6 Clicks" Mobility   Outcome Measure  Help needed turning from your back to your side while in a flat bed without using bedrails?: Total Help needed moving from lying on your back to sitting on the side of a flat bed without using bedrails?: Total Help needed moving to and from a bed to a chair (including a wheelchair)?: Total Help needed standing up from a chair using your arms (e.g., wheelchair or bedside chair)?: Total Help needed to walk in hospital room?: Total Help needed climbing 3-5 steps with a railing? : Total 6 Click Score: 6    End of Session   Activity Tolerance: Patient limited by lethargy Patient left: in bed;with call bell/phone within reach;with bed alarm set(in chair position) Nurse Communication: Mobility status PT Visit Diagnosis: Other abnormalities of gait and mobility (R26.89);Other symptoms and signs involving the nervous system (R29.898)     Time: [id number] PT Time Calculation (min) (ACUTE ONLY): 27 min  Charges:  $Therapeutic Activity: 8-22 mins                     [place]  Pager [phone] Office [place] 08/16/2019, 4:42 PM

## 2019-08-17 DIAGNOSIS — B957 Other staphylococcus as the cause of diseases classified elsewhere: Secondary | ICD-10-CM

## 2019-08-17 DIAGNOSIS — R509 Fever, unspecified: Secondary | ICD-10-CM

## 2019-08-17 DIAGNOSIS — B958 Unspecified staphylococcus as the cause of diseases classified elsewhere: Secondary | ICD-10-CM

## 2019-08-17 LAB — CULTURE, BLOOD (ROUTINE X 2)
Special Requests: ADEQUATE
Special Requests: ADEQUATE

## 2019-08-17 LAB — GLUCOSE, CAPILLARY
Glucose-Capillary: 143 mg/dL — ABNORMAL HIGH (ref 70–99)
Glucose-Capillary: 146 mg/dL — ABNORMAL HIGH (ref 70–99)
Glucose-Capillary: 150 mg/dL — ABNORMAL HIGH (ref 70–99)
Glucose-Capillary: 152 mg/dL — ABNORMAL HIGH (ref 70–99)
Glucose-Capillary: 163 mg/dL — ABNORMAL HIGH (ref 70–99)
Glucose-Capillary: 168 mg/dL — ABNORMAL HIGH (ref 70–99)

## 2019-08-17 MED ORDER — INSULIN ASPART 100 UNIT/ML ~~LOC~~ SOLN: 0.0000 [IU] | Freq: Every day | SUBCUTANEOUS | Status: DC

## 2019-08-17 MED ORDER — INSULIN ASPART 100 UNIT/ML ~~LOC~~ SOLN
0.0000 [IU] | SUBCUTANEOUS | Status: DC
  Administered 2019-08-17 – 2019-08-18 (×2): 1 [IU] via SUBCUTANEOUS
  Administered 2019-08-18 (×2): 2 [IU] via SUBCUTANEOUS
  Administered 2019-08-18: 17:00:00 1 [IU] via SUBCUTANEOUS
  Administered 2019-08-19: 2 [IU] via SUBCUTANEOUS
  Administered 2019-08-19: 13:00:00 1 [IU] via SUBCUTANEOUS
  Administered 2019-08-19: 01:00:00 2 [IU] via SUBCUTANEOUS
  Administered 2019-08-19: 1 [IU] via SUBCUTANEOUS

## 2019-08-17 MED ORDER — INSULIN ASPART 100 UNIT/ML ~~LOC~~ SOLN: 0.0000 [IU] | Freq: Three times a day (TID) | SUBCUTANEOUS | Status: DC

## 2019-08-17 MED ORDER — METOPROLOL TARTRATE 25 MG PO TABS
25.0000 mg | ORAL_TABLET | Freq: Two times a day (BID) | ORAL | Status: DC
  Administered 2019-08-17 – 2019-08-18 (×2): 25 mg via ORAL
  Filled 2019-08-17 (×3): qty 1

## 2019-08-17 NOTE — Progress Notes (Signed)
Patient ID: Andres [name], male   DOB: 1941-02-05, 79 y.o.   MRN: [id number]         [facility] for Infectious Disease  Date of Admission:  08/09/2019           Day 3 cefazolin ASSESSMENT: He developed 1 low-grade fever 5 days into this hospitalization following a significant stroke.  Blood cultures have grown the same coag negative staph.  Repeat blood cultures are negative.  Thanks this is transient, uncomplicated coag negative staph bacteremia.  Published guidelines call for 5 days of antibiotic therapy.  PLAN: 1. Continue cefazolin 2 more days through 08/19/2019 2. Please call if I can be of further assistance  Principal Problem:   Acute ischemic stroke [facility]) Active Problems:   Coagulase negative Staphylococcus bacteremia   Hyperlipidemia   Essential hypertension   BPH (benign prostatic hyperplasia)   CAD (coronary artery disease), hx of CABG 1997 X 6. Last nuc 2012 negative.   Morbid obesity ([place])   Transaminitis   History of seizures   Scheduled Meds: .  stroke: mapping our early stages of recovery book   Does not apply Once  . aspirin  81 mg Per Tube Daily   Or  . aspirin  300 mg Rectal Daily  . atorvastatin  40 mg Per Tube Daily  . clopidogrel  75 mg Per Tube Daily  . feeding supplement (PRO-STAT SUGAR FREE 64)  30 mL Per Tube Daily  . metoprolol tartrate  5 mg Intravenous Q6H  . tamsulosin  0.4 mg Oral Daily   Continuous Infusions: .  ceFAZolin (ANCEF) IV 2 g (08/17/19 0721)  . feeding supplement (JEVITY 1.2 CAL) 65 mL/hr at 08/16/19 1353  . levETIRAcetam 750 mg (08/17/19 1044)   PRN Meds:.acetaminophen **OR** acetaminophen (TYLENOL) oral liquid 160 mg/5 mL **OR** acetaminophen, diclofenac sodium, LORazepam, polyethylene glycol  Review of Systems: Review of Systems  Unable to perform ROS: Mental acuity    No Known Allergies  OBJECTIVE: Vitals:   08/16/19 2328 08/17/19 0405 08/17/19 0500 08/17/19 0755  BP: (!) 150/77 137/77  (!) 158/81  Pulse: 84  83  79  Resp: 20 20  20   Temp: 98.7 F (37.1 C) 98.9 F (37.2 C)  98 F (36.7 C)  TempSrc:  Oral  Oral  SpO2: 96% 94%  96%  Weight:   103.8 kg   Height:       Body mass index is 35.84 kg/m.  Physical Exam Constitutional:      Comments: He was sleeping but opens eyes to voice.  Cardiovascular:     Rate and Rhythm: Normal rate and regular rhythm.     Heart sounds: No murmur.  Pulmonary:     Effort: Pulmonary effort is normal.     Breath sounds: Normal breath sounds.     Lab Results Lab Results  Component Value Date   WBC 6.6 08/15/2019   HGB 10.9 (L) 08/15/2019   HCT 32.9 (L) 08/15/2019   MCV 101.5 (H) 08/15/2019   PLT 214 08/15/2019    Lab Results  Component Value Date   CREATININE 1.15 08/15/2019   BUN 16 08/15/2019   NA 142 08/15/2019   K 4.1 08/15/2019   CL 105 08/15/2019   CO2 30 08/15/2019    Lab Results  Component Value Date   ALT 30 08/15/2019   AST 37 08/15/2019   ALKPHOS 61 08/15/2019   BILITOT 0.4 08/15/2019     Microbiology: Recent Results (from the past 240 hour(s))  SARS CORONAVIRUS 2 (TAT 6-24 HRS) Nasopharyngeal Nasopharyngeal Swab     Status: None   Collection Time: 08/09/19 11:30 AM   Specimen: Nasopharyngeal Swab  Result Value Ref Range Status   SARS Coronavirus 2 NEGATIVE NEGATIVE Final    Comment: (NOTE) SARS-CoV-2 target nucleic acids are NOT DETECTED. The SARS-CoV-2 RNA is generally detectable in upper and lower respiratory specimens during the acute phase of infection. Negative results do not preclude SARS-CoV-2 infection, do not rule out co-infections with other pathogens, and should not be used as the sole basis for treatment or other patient management decisions. Negative results must be combined with clinical observations, patient history, and epidemiological information. The expected result is Negative. Fact Sheet for Patients: SugarRoll.be Fact Sheet for Healthcare  Providers: https://www.woods-mathews.com/ This test is not yet approved or cleared by the [place] FDA and  has been authorized for detection and/or diagnosis of SARS-CoV-2 by FDA under an Emergency Use Authorization (EUA). This EUA will remain  in effect (meaning this test can be used) for the duration of the COVID-19 declaration under Section 56 4(b)(1) of the Act, 21 U.S.C. section 360bbb-3(b)(1), unless the authorization is terminated or revoked sooner. Performed at [facility], [place] [address], [place], [place] [address]   Culture, blood (routine x 2)     Status: Abnormal   Collection Time: 08/14/19  9:32 AM   Specimen: BLOOD RIGHT HAND  Result Value Ref Range Status   Specimen Description BLOOD RIGHT HAND  Final   Special Requests   Final    BOTTLES DRAWN AEROBIC ONLY Blood Culture adequate volume   Culture  Setup Time   Final    AEROBIC BOTTLE ONLY GRAM POSITIVE COCCI CRITICAL VALUE NOTED.  VALUE IS CONSISTENT WITH PREVIOUSLY REPORTED AND CALLED VALUE. Performed at [facility], [place] [address] [place], [place] [address]    Culture STAPHYLOCOCCUS CAPITIS (A)  Final   Report Status 08/17/2019 FINAL  Final   Organism ID, Bacteria STAPHYLOCOCCUS CAPITIS  Final      Susceptibility   Staphylococcus capitis - MIC*    CIPROFLOXACIN <=0.5 SENSITIVE Sensitive     ERYTHROMYCIN <=0.25 SENSITIVE Sensitive     GENTAMICIN <=0.5 SENSITIVE Sensitive     OXACILLIN SENSITIVE Sensitive     TETRACYCLINE <=1 SENSITIVE Sensitive     VANCOMYCIN 1 SENSITIVE Sensitive     TRIMETH/SULFA <=10 SENSITIVE Sensitive     CLINDAMYCIN <=0.25 SENSITIVE Sensitive     RIFAMPIN <=0.5 SENSITIVE Sensitive     Inducible Clindamycin NEGATIVE Sensitive     * STAPHYLOCOCCUS CAPITIS  Culture, blood (routine x 2)     Status: Abnormal   Collection Time: 08/14/19  9:32 AM   Specimen: BLOOD RIGHT WRIST  Result Value Ref Range Status   Specimen Description BLOOD RIGHT WRIST  Final    Special Requests   Final    BOTTLES DRAWN AEROBIC ONLY Blood Culture adequate volume   Culture  Setup Time   Final    AEROBIC BOTTLE ONLY GRAM POSITIVE COCCI CRITICAL RESULT CALLED TO, READ BACK BY AND VERIFIED WITH: PHARMD C ROBERTSON [id number] AT 622 AN BY CM Performed at [facility], [place] [address], [place], [place] [address]    Culture STAPHYLOCOCCUS CAPITIS (A)  Final   Report Status 08/17/2019 FINAL  Final   Organism ID, Bacteria STAPHYLOCOCCUS CAPITIS  Final      Susceptibility   Staphylococcus capitis - MIC*    CIPROFLOXACIN <=0.5 SENSITIVE Sensitive  ERYTHROMYCIN <=0.25 SENSITIVE Sensitive     GENTAMICIN <=0.5 SENSITIVE Sensitive     OXACILLIN <=0.25 SENSITIVE Sensitive     TETRACYCLINE <=1 SENSITIVE Sensitive     VANCOMYCIN 1 SENSITIVE Sensitive     TRIMETH/SULFA <=10 SENSITIVE Sensitive     CLINDAMYCIN <=0.25 SENSITIVE Sensitive     RIFAMPIN <=0.5 SENSITIVE Sensitive     Inducible Clindamycin NEGATIVE Sensitive     * STAPHYLOCOCCUS CAPITIS  Blood Culture ID Panel (Reflexed)     Status: Abnormal   Collection Time: 08/14/19  9:32 AM  Result Value Ref Range Status   Enterococcus species NOT DETECTED NOT DETECTED Final   Listeria monocytogenes NOT DETECTED NOT DETECTED Final   Staphylococcus species DETECTED (A) NOT DETECTED Final    Comment: Methicillin (oxacillin) susceptible coagulase negative staphylococcus. Possible blood culture contaminant (unless isolated from more than one blood culture draw or clinical case suggests pathogenicity). No antibiotic treatment is indicated for blood  culture contaminants. CRITICAL RESULT CALLED TO, READ BACK BY AND VERIFIED WITH: PHARMD C ROBERTSON [id number] AT 625 AM BY CM    Staphylococcus aureus (BCID) NOT DETECTED NOT DETECTED Final   Methicillin resistance NOT DETECTED NOT DETECTED Final   Streptococcus species NOT DETECTED NOT DETECTED Final   Streptococcus agalactiae NOT DETECTED NOT DETECTED Final   Streptococcus  pneumoniae NOT DETECTED NOT DETECTED Final   Streptococcus pyogenes NOT DETECTED NOT DETECTED Final   Acinetobacter baumannii NOT DETECTED NOT DETECTED Final   Enterobacteriaceae species NOT DETECTED NOT DETECTED Final   Enterobacter cloacae complex NOT DETECTED NOT DETECTED Final   Escherichia coli NOT DETECTED NOT DETECTED Final   Klebsiella oxytoca NOT DETECTED NOT DETECTED Final   Klebsiella pneumoniae NOT DETECTED NOT DETECTED Final   Proteus species NOT DETECTED NOT DETECTED Final   Serratia marcescens NOT DETECTED NOT DETECTED Final   Haemophilus influenzae NOT DETECTED NOT DETECTED Final   Neisseria meningitidis NOT DETECTED NOT DETECTED Final   Pseudomonas aeruginosa NOT DETECTED NOT DETECTED Final   Candida albicans NOT DETECTED NOT DETECTED Final   Candida glabrata NOT DETECTED NOT DETECTED Final   Candida krusei NOT DETECTED NOT DETECTED Final   Candida parapsilosis NOT DETECTED NOT DETECTED Final   Candida tropicalis NOT DETECTED NOT DETECTED Final    Comment: Performed at [facility], [place]. [address], [place], [place] [address]  Culture, Urine     Status: Abnormal   Collection Time: 08/14/19  3:49 PM   Specimen: Urine, Clean Catch  Result Value Ref Range Status   Specimen Description URINE, CLEAN CATCH  Final   Special Requests   Final    NONE Performed at [facility], [place] [address], [place], [place] [address]    Culture MULTIPLE SPECIES PRESENT, SUGGEST RECOLLECTION (A)  Final   Report Status 08/15/2019 FINAL  Final  Culture, blood (routine x 2)     Status: None (Preliminary result)   Collection Time: 08/15/19  2:30 PM   Specimen: BLOOD  Result Value Ref Range Status   Specimen Description BLOOD RIGHT ANTECUBITAL  Final   Special Requests   Final    BOTTLES DRAWN AEROBIC AND ANAEROBIC Blood Culture adequate volume   Culture   Final    NO GROWTH 2 DAYS Performed at [facility], [place] [address], [place], [place] [address]    Report Status  PENDING  Incomplete  Culture, blood (routine x 2)     Status: None (Preliminary result)   Collection Time: 08/15/19  2:35 PM   Specimen: BLOOD  Result Value Ref Range Status   Specimen Description BLOOD LEFT ANTECUBITAL  Final   Special Requests   Final    BOTTLES DRAWN AEROBIC AND ANAEROBIC Blood Culture adequate volume   Culture   Final    NO GROWTH 2 DAYS Performed at [facility], [address], [place], [place] [address]    Report Status PENDING  Incomplete    Michel Bickers, MD [facility] for Infectious [place] [phone] pager   [phone] cell 08/17/2019, 12:47 PM

## 2019-08-17 NOTE — Progress Notes (Signed)
Nutrition Follow-up  DOCUMENTATION CODES:   Obesity unspecified  INTERVENTION:  Continue Jevity 1.2 formula via Cortrak NGT at goal rate of 65 ml/hr.   30 ml Prostat once daily.    Tube feeding regimen provides 1972 kcal (100% of needs), 102 grams of protein, and 1264 ml of H2O.   NUTRITION DIAGNOSIS:   Inadequate oral intake related to inability to eat as evidenced by NPO status; ongoing  GOAL:   Patient will meet greater than or equal to 90% of their needs; met with TF  MONITOR:   TF tolerance, Weight trends, Labs, Skin, I & O's  REASON FOR ASSESSMENT:   Consult Enteral/tube feeding initiation and management  ASSESSMENT:   79 y.o. male with history of stroke, HTN, HLD, DB, CAD, obesity, carotid disease, seizures  found down by son at home confused with L sided weakness. Pt with Acute R MCA infarct surrounding of R MCA parietal infarct, due to right M1 occlusion. Cortrak NGT placed 1/8.   Pt continues on NPO status. Pt with some confusion during time of visit. MD and staff aware. Pt has been tolerating his tube feeding well with no difficulties. RD to continue with current order and monitor for tolerance. Labs and medications reviewed.    NUTRITION - FOCUSED PHYSICAL EXAM:    Most Recent Value  Orbital Region  No depletion  Upper Arm Region  No depletion  Thoracic and Lumbar Region  No depletion  Buccal Region  No depletion  Temple Region  No depletion  Clavicle Bone Region  No depletion  Clavicle and Acromion Bone Region  No depletion  Scapular Bone Region  No depletion  Dorsal Hand  No depletion  Patellar Region  No depletion  Anterior Thigh Region  No depletion  Posterior Calf Region  No depletion  Edema (RD Assessment)  Mild  Hair  Reviewed  Eyes  Reviewed  Mouth  Reviewed  Skin  Reviewed  Nails  Reviewed       Diet Order:   Diet Order            Diet NPO time specified  Diet effective now              EDUCATION NEEDS:   Not appropriate  for education at this time  Skin:  Skin Assessment: Reviewed RN Assessment  Last BM:  1/13  Height:   Ht Readings from Last 1 Encounters:  08/10/19 5' 7" (1.702 m)    Weight:   Wt Readings from Last 1 Encounters:  08/17/19 103.8 kg    Ideal Body Weight:  67.27 kg  BMI:  Body mass index is 35.84 kg/m.  Estimated Nutritional Needs:   Kcal:  1850-2050  Protein:  90-105 grams  Fluid:  >/= 1.8 L/day    Corrin Parker, MS, RD, LDN Pager # [phone] After hours/ weekend pager # [phone]

## 2019-08-17 NOTE — Progress Notes (Signed)
Patient ID: Andres [name], male   DOB: [birth year], 79 y.o.   MRN: [id number]  PROGRESS NOTE    Andres [name]  [id number] DOB: 03-Sep-1940 DOA: 08/09/2019 PCP: [name], MD   Brief Narrative:  79 year old male with history of hypertension, hyperlipidemia, CAD status post CABG, carotid artery stenosis, unspecified CVA, mitral valve prolapse, diabetes mellitus type 2, BPH, hepatitis, GERD presented after being found down with associated left-sided weakness and dysarthria.  He was found to have acute right MCA stroke.  Neurology was consulted.  He was also found to have staph capitis bacteremia for which ID was consulted.  Assessment & Plan:   Acute right MCA stroke -Presented with left sided weakness and dysarthria.  MRI significant for large area of acute right MCA territory infarct -LDL 100.  A1c 6.2 -Transthoracic echo showed no evidence of embolic source -PT is recommending SNF placement.  Social worker consult -SLP following.  Currently n.p.o.  Continue feeding via cortrak -Neurology has signed off: Continue dual antiplatelet therapy for 3 months followed by Plavix alone.  Outpatient follow-up with neurology.  Acute metabolic encephalopathy/delirium/lethargy -Mental status has slightly improved after discontinuation of Ativan.  Monitor.  Staph capitis bacteremia -Currently on Ancef.  Repeat cultures are negative so far.  ID following.  History of seizures -EEG showed no evidence of seizures.  Patient was having left-sided tremors and there was a concern for subclinical seizures.   -CT of the head showed progression of right MCA territory infarct  -neurology following.  Continue Keppra 750 mg twice daily.  Use Ativan only for generalized tonic-clonic seizure lasting more than 2 minutes or focal seizure lasting more than 5 minutes  Diabetes mellitus type 2 -A1c 6.2.  Not on any medications as outpatient  Essential hypertension -Blood pressure stable.  Continue metoprolol  but will switch to oral.  Will probably restart losartan and hydrochlorothiazide as needed in the next 1 to 2 days  CAD with history of CABG in 1997 -Continue aspirin, Plavix, Lipitor.  Outpatient follow-up with cardiology  Chronic kidney disease stage III -Stable  BPH -Continue Flomax  Hyperlipidemia -Continue Lipitor  Obesity -Outpatient follow-up  DVT prophylaxis: SCDs Code Status: Full Family Communication: None at bedside Disposition Plan: We will need SNF placement  Consultants: Neurology/infectious disease  Procedures:   08/09/2019: Transthoracic Echocardiogram IMPRESSIONS   1. Left ventricular ejection fraction, by visual estimation, is 60%. The left ventricle has normal function. There is no left ventricular hypertrophy. 2. Abnormal septal motion secondary to conduction delay. 3. Elevated left ventricular end-diastolic pressure. 4. Left ventricular diastolic parameters are consistent with Grade II diastolic dysfunction (pseudonormalization). 5. Global right ventricle has normal systolic function.The right ventricular size is normal. No increase in right ventricular wall thickness. 6. Left atrial size was normal. 7. Right atrial size was normal. 8. The mitral valve is abnormal. Trivial mitral valve regurgitation. No evidence of mitral stenosis. 9. Mild bileaflet mitral valve prolapse. 10. The tricuspid valve is normal in structure. 11. The aortic valve is normal in structure. Aortic valve regurgitation is not visualized. No evidence of aortic valve sclerosis or stenosis. 12. The pulmonic valve was normal in structure. Pulmonic valve regurgitation is trivial. 13. The inferior vena cava is normal in size with greater than 50% respiratory variability, suggesting right atrial pressure of 3 mmHg. 14. The interatrial septum was not well visualized. 15. No intracardiac source of emboli noted.   1/5: EEG DESCRIPTION:No clear posterior dominant rhythm was  seen. EEG showed continuous generalized  and lateralized right hemisphere 3-6hz  theta-delta slowing. Hyperventilation and photic stimulation were not performed.  ABNORMALITY - Continuous slow, generalized and lateralized right hemisphere  IMPRESSION: This studyis suggestive of cortical dysfunction in right hemisphere consistent with underlying infarct. Additionally, there is evidence of moderate diffuse encephalopathy.No seizures or epileptiform discharges were seen throughout the recording.  Antimicrobials:  Anti-infectives (From admission, onward)   Start     Dose/Rate Route Frequency Ordered Stop   08/15/19 0730  ceFAZolin (ANCEF) IVPB 2g/100 mL premix     2 g 200 mL/hr over 30 Minutes Intravenous Every 8 hours 08/15/19 0636 08/19/19 2359       Subjective: Patient seen and examined at bedside.  He is a poor historian.  Awake, having hard time answering questions, very slow to respond to questions.  No overnight fever or vomiting reported.  Objective: Vitals:   08/16/19 2328 08/17/19 0405 08/17/19 0500 08/17/19 0755  BP: (!) 150/77 137/77  (!) 158/81  Pulse: 84 83  79  Resp: 20 20  20   Temp: 98.7 F (37.1 C) 98.9 F (37.2 C)  98 F (36.7 C)  TempSrc:  Oral  Oral  SpO2: 96% 94%  96%  Weight:   103.8 kg   Height:        Intake/Output Summary (Last 24 hours) at 08/17/2019 1250 Last data filed at 08/17/2019 0600 Gross per 24 hour  Intake --  Output 500 ml  Net -500 ml   Filed Weights   08/15/19 0319 08/15/19 1959 08/17/19 0500  Weight: 103.9 kg 103.4 kg 103.8 kg    Examination:  General exam: Appears calm and comfortable.  Looks chronically ill.  Awake, very poor historian, very slow to respond to questions.  Dysarthric Respiratory system: Bilateral decreased breath sounds at bases Cardiovascular system: S1 & S2 heard, Rate controlled Gastrointestinal system: Abdomen is nondistended, soft and nontender. Normal bowel sounds heard. Extremities: No cyanosis,  clubbing; trace lower extremity edema   Data Reviewed: I have personally reviewed following labs and imaging studies  CBC: Recent Labs  Lab 08/14/19 0932 08/15/19 1051  WBC 7.2 6.6  HGB 11.2* 10.9*  HCT 33.1* 32.9*  MCV 100.0 101.5*  PLT 194 [id number]   Basic Metabolic Panel: Recent Labs  Lab 08/12/19 0505 08/14/19 0428 08/15/19 1051  NA 138 139 142  K 4.3 4.2 4.1  CL 102 104 105  CO2 20* 24 30  GLUCOSE 89 180* 156*  BUN 12 17 16   CREATININE 1.24 1.11 1.15  CALCIUM 9.1 9.0 9.1  MG  --  1.7  --    GFR: Estimated Creatinine Clearance: 58.8 mL/min (by C-G formula based on SCr of 1.15 mg/dL). Liver Function Tests: Recent Labs  Lab 08/15/19 1051  AST 37  ALT 30  ALKPHOS 61  BILITOT 0.4  PROT 6.0*  ALBUMIN 2.6*   No results for input(s): LIPASE, AMYLASE in the last 168 hours. Recent Labs  Lab 08/15/19 1051  AMMONIA 19   Coagulation Profile: No results for input(s): INR, PROTIME in the last 168 hours. Cardiac Enzymes: No results for input(s): CKTOTAL, CKMB, CKMBINDEX, TROPONINI in the last 168 hours. BNP (last 3 results) No results for input(s): PROBNP in the last 8760 hours. HbA1C: No results for input(s): HGBA1C in the last 72 hours. CBG: Recent Labs  Lab 08/16/19 2229 08/17/19 0002 08/17/19 0437 08/17/19 0755 08/17/19 1150  GLUCAP 148* 143* 152* 163* 146*   Lipid Profile: No results for input(s): CHOL, HDL, LDLCALC, TRIG, CHOLHDL, LDLDIRECT in the  last 72 hours. Thyroid Function Tests: Recent Labs    08/15/19 1051  TSH 1.076   Anemia Panel: No results for input(s): VITAMINB12, FOLATE, FERRITIN, TIBC, IRON, RETICCTPCT in the last 72 hours. Sepsis Labs: No results for input(s): PROCALCITON, LATICACIDVEN in the last 168 hours.  Recent Results (from the past 240 hour(s))  SARS CORONAVIRUS 2 (TAT 6-24 HRS) Nasopharyngeal Nasopharyngeal Swab     Status: None   Collection Time: 08/09/19 11:30 AM   Specimen: Nasopharyngeal Swab  Result Value Ref  Range Status   SARS Coronavirus 2 NEGATIVE NEGATIVE Final    Comment: (NOTE) SARS-CoV-2 target nucleic acids are NOT DETECTED. The SARS-CoV-2 RNA is generally detectable in upper and lower respiratory specimens during the acute phase of infection. Negative results do not preclude SARS-CoV-2 infection, do not rule out co-infections with other pathogens, and should not be used as the sole basis for treatment or other patient management decisions. Negative results must be combined with clinical observations, patient history, and epidemiological information. The expected result is Negative. Fact Sheet for Patients: SugarRoll.be Fact Sheet for Healthcare Providers: https://www.woods-mathews.com/ This test is not yet approved or cleared by the [place] FDA and  has been authorized for detection and/or diagnosis of SARS-CoV-2 by FDA under an Emergency Use Authorization (EUA). This EUA will remain  in effect (meaning this test can be used) for the duration of the COVID-19 declaration under Section 56 4(b)(1) of the Act, 21 U.S.C. section 360bbb-3(b)(1), unless the authorization is terminated or revoked sooner. Performed at [facility], [place] [address], [place], [place] [address]   Culture, blood (routine x 2)     Status: Abnormal   Collection Time: 08/14/19  9:32 AM   Specimen: BLOOD RIGHT HAND  Result Value Ref Range Status   Specimen Description BLOOD RIGHT HAND  Final   Special Requests   Final    BOTTLES DRAWN AEROBIC ONLY Blood Culture adequate volume   Culture  Setup Time   Final    AEROBIC BOTTLE ONLY GRAM POSITIVE COCCI CRITICAL VALUE NOTED.  VALUE IS CONSISTENT WITH PREVIOUSLY REPORTED AND CALLED VALUE. Performed at [facility], [place] [address] [place], [place] [address]    Culture STAPHYLOCOCCUS CAPITIS (A)  Final   Report Status 08/17/2019 FINAL  Final   Organism ID, Bacteria STAPHYLOCOCCUS CAPITIS  Final       Susceptibility   Staphylococcus capitis - MIC*    CIPROFLOXACIN <=0.5 SENSITIVE Sensitive     ERYTHROMYCIN <=0.25 SENSITIVE Sensitive     GENTAMICIN <=0.5 SENSITIVE Sensitive     OXACILLIN SENSITIVE Sensitive     TETRACYCLINE <=1 SENSITIVE Sensitive     VANCOMYCIN 1 SENSITIVE Sensitive     TRIMETH/SULFA <=10 SENSITIVE Sensitive     CLINDAMYCIN <=0.25 SENSITIVE Sensitive     RIFAMPIN <=0.5 SENSITIVE Sensitive     Inducible Clindamycin NEGATIVE Sensitive     * STAPHYLOCOCCUS CAPITIS  Culture, blood (routine x 2)     Status: Abnormal   Collection Time: 08/14/19  9:32 AM   Specimen: BLOOD RIGHT WRIST  Result Value Ref Range Status   Specimen Description BLOOD RIGHT WRIST  Final   Special Requests   Final    BOTTLES DRAWN AEROBIC ONLY Blood Culture adequate volume   Culture  Setup Time   Final    AEROBIC BOTTLE ONLY GRAM POSITIVE COCCI CRITICAL RESULT CALLED TO, READ BACK BY AND VERIFIED WITH: PHARMD C ROBERTSON [id number] AT [id number] AN BY CM Performed at [facility]  [facility], [place] [address] [place], [place] [address]    Culture STAPHYLOCOCCUS CAPITIS (A)  Final   Report Status 08/17/2019 FINAL  Final   Organism ID, Bacteria STAPHYLOCOCCUS CAPITIS  Final      Susceptibility   Staphylococcus capitis - MIC*    CIPROFLOXACIN <=0.5 SENSITIVE Sensitive     ERYTHROMYCIN <=0.25 SENSITIVE Sensitive     GENTAMICIN <=0.5 SENSITIVE Sensitive     OXACILLIN <=0.25 SENSITIVE Sensitive     TETRACYCLINE <=1 SENSITIVE Sensitive     VANCOMYCIN 1 SENSITIVE Sensitive     TRIMETH/SULFA <=10 SENSITIVE Sensitive     CLINDAMYCIN <=0.25 SENSITIVE Sensitive     RIFAMPIN <=0.5 SENSITIVE Sensitive     Inducible Clindamycin NEGATIVE Sensitive     * STAPHYLOCOCCUS CAPITIS  Blood Culture ID Panel (Reflexed)     Status: Abnormal   Collection Time: 08/14/19  9:32 AM  Result Value Ref Range Status   Enterococcus species NOT DETECTED NOT DETECTED Final   Listeria monocytogenes NOT DETECTED NOT DETECTED Final    Staphylococcus species DETECTED (A) NOT DETECTED Final    Comment: Methicillin (oxacillin) susceptible coagulase negative staphylococcus. Possible blood culture contaminant (unless isolated from more than one blood culture draw or clinical case suggests pathogenicity). No antibiotic treatment is indicated for blood  culture contaminants. CRITICAL RESULT CALLED TO, READ BACK BY AND VERIFIED WITH: PHARMD C ROBERTSON [id number] AT 625 AM BY CM    Staphylococcus aureus (BCID) NOT DETECTED NOT DETECTED Final   Methicillin resistance NOT DETECTED NOT DETECTED Final   Streptococcus species NOT DETECTED NOT DETECTED Final   Streptococcus agalactiae NOT DETECTED NOT DETECTED Final   Streptococcus pneumoniae NOT DETECTED NOT DETECTED Final   Streptococcus pyogenes NOT DETECTED NOT DETECTED Final   Acinetobacter baumannii NOT DETECTED NOT DETECTED Final   Enterobacteriaceae species NOT DETECTED NOT DETECTED Final   Enterobacter cloacae complex NOT DETECTED NOT DETECTED Final   Escherichia coli NOT DETECTED NOT DETECTED Final   Klebsiella oxytoca NOT DETECTED NOT DETECTED Final   Klebsiella pneumoniae NOT DETECTED NOT DETECTED Final   Proteus species NOT DETECTED NOT DETECTED Final   Serratia marcescens NOT DETECTED NOT DETECTED Final   Haemophilus influenzae NOT DETECTED NOT DETECTED Final   Neisseria meningitidis NOT DETECTED NOT DETECTED Final   Pseudomonas aeruginosa NOT DETECTED NOT DETECTED Final   Candida albicans NOT DETECTED NOT DETECTED Final   Candida glabrata NOT DETECTED NOT DETECTED Final   Candida krusei NOT DETECTED NOT DETECTED Final   Candida parapsilosis NOT DETECTED NOT DETECTED Final   Candida tropicalis NOT DETECTED NOT DETECTED Final    Comment: Performed at [facility], [place]. [address], [place], [place] [address]  Culture, Urine     Status: Abnormal   Collection Time: 08/14/19  3:49 PM   Specimen: Urine, Clean Catch  Result Value Ref Range Status   Specimen Description  URINE, CLEAN CATCH  Final   Special Requests   Final    NONE Performed at [facility], [place] [address] [place], [place] [address]    Culture MULTIPLE SPECIES PRESENT, SUGGEST RECOLLECTION (A)  Final   Report Status 08/15/2019 FINAL  Final  Culture, blood (routine x 2)     Status: None (Preliminary result)   Collection Time: 08/15/19  2:30 PM   Specimen: BLOOD  Result Value Ref Range Status   Specimen Description BLOOD RIGHT ANTECUBITAL  Final   Special Requests   Final    BOTTLES DRAWN AEROBIC  AND ANAEROBIC Blood Culture adequate volume   Culture   Final    NO GROWTH 2 DAYS Performed at [facility], [place] [address], [place], [place] [address]    Report Status PENDING  Incomplete  Culture, blood (routine x 2)     Status: None (Preliminary result)   Collection Time: 08/15/19  2:35 PM   Specimen: BLOOD  Result Value Ref Range Status   Specimen Description BLOOD LEFT ANTECUBITAL  Final   Special Requests   Final    BOTTLES DRAWN AEROBIC AND ANAEROBIC Blood Culture adequate volume   Culture   Final    NO GROWTH 2 DAYS Performed at [facility], [place] [address] [place], [place] [address]    Report Status PENDING  Incomplete         Radiology Studies: DG Abd Portable 1V  Result Date: 08/15/2019 CLINICAL DATA:  Enteric tube placement EXAM: PORTABLE ABDOMEN - 1 VIEW COMPARISON:  08/09/2019 FINDINGS: Feeding tube enters the stomach and turned to the right. Tip appears to be in the region of the duodenal bulb. Normal bowel gas pattern. IMPRESSION: Feeding tube tip in the region of the duodenal bulb. Electronically Signed   By: Franchot Gallo M.D.   On: 08/15/2019 15:33   Overnight EEG with video  Result Date: 08/15/2019 Lora Havens, MD     08/15/2019  1:51 PM Patient Name: Andres [name] MRN: [id number] Epilepsy Attending: Lora Havens Referring Physician/Provider: Dr Karena Addison rule Duration: 08/13/2018 1128 to 08/15/2019 1250  Patient history: 34 M with  history of seziure who presented with stroke with and frequent left-sided twitching concerning for seizure. EEG to evaluate for seizure.  Level of alertness:  Awake, lethragic  AEDs during EEG study: LEV  Technical aspects: This EEG study was done with scalp electrodes positioned according to the 10-20 International system of electrode placement. Electrical activity was acquired at a sampling rate of 500Hz  and reviewed with a high frequency filter of 70Hz  and a low frequency filter of 1Hz . EEG data were recorded continuously and digitally stored.  DESCRIPTION:  During awake state, no clear posterior dominant rhythm is seen. EEG showed continuous generalized and lateralized right hemisphere low amplitude 3-5hz  theta-delta slowing. Hyperventilation and photic stimulation were not performed.  ABNORMALITY - Continuous slow, generalized and lateralized right hemisphere  IMPRESSION: This study is suggestive of cortical dysfunction in right hemisphere likely secondary to underlying infarct as well as severe diffuse encephalopathy, non specific to etiology. No seizures or epileptiform discharges were seen throughout the recording.  Priyanka Barbra Sarks        Scheduled Meds: .  stroke: mapping our early stages of recovery book   Does not apply Once  . aspirin  81 mg Per Tube Daily   Or  . aspirin  300 mg Rectal Daily  . atorvastatin  40 mg Per Tube Daily  . clopidogrel  75 mg Per Tube Daily  . feeding supplement (PRO-STAT SUGAR FREE 64)  30 mL Per Tube Daily  . metoprolol tartrate  5 mg Intravenous Q6H  . tamsulosin  0.4 mg Oral Daily   Continuous Infusions: .  ceFAZolin (ANCEF) IV 2 g (08/17/19 0721)  . feeding supplement (JEVITY 1.2 CAL) 65 mL/hr at 08/16/19 1353  . levETIRAcetam 750 mg (08/17/19 1044)          Aline August, MD [facility] 08/17/2019, 12:50 PM

## 2019-08-17 NOTE — Progress Notes (Signed)
Inpatient Rehabilitation Admissions Coordinator  I continue to await insurance decision on our CIR authorization request. I spoke with pt's daughter, [name], by phone and she is aware. She is out of town until Saturday and will return but is reachable by cell at anytime.  Danne Baxter, RN, MSN Rehab Admissions Coordinator [phone] 08/17/2019 4:48 PM

## 2019-08-18 LAB — GLUCOSE, CAPILLARY
Glucose-Capillary: 113 mg/dL — ABNORMAL HIGH (ref 70–99)
Glucose-Capillary: 120 mg/dL — ABNORMAL HIGH (ref 70–99)
Glucose-Capillary: 138 mg/dL — ABNORMAL HIGH (ref 70–99)
Glucose-Capillary: 141 mg/dL — ABNORMAL HIGH (ref 70–99)
Glucose-Capillary: 159 mg/dL — ABNORMAL HIGH (ref 70–99)
Glucose-Capillary: 161 mg/dL — ABNORMAL HIGH (ref 70–99)

## 2019-08-18 LAB — BASIC METABOLIC PANEL
Anion gap: 11 (ref 5–15)
BUN: 22 mg/dL (ref 8–23)
CO2: 27 mmol/L (ref 22–32)
Calcium: 9.5 mg/dL (ref 8.9–10.3)
Chloride: 101 mmol/L (ref 98–111)
Creatinine, Ser: 1.16 mg/dL (ref 0.61–1.24)
GFR calc Af Amer: >60 mL/min (ref >60)
GFR calc non Af Amer: 59 mL/min — ABNORMAL LOW (ref >60)
Glucose, Bld: 156 mg/dL — ABNORMAL HIGH (ref 70–99)
Potassium: 4.7 mmol/L (ref 3.5–5.1)
Sodium: 139 mmol/L (ref 135–145)

## 2019-08-18 LAB — CBC WITH DIFFERENTIAL/PLATELET
Abs Immature Granulocytes: 0.08 10*3/uL — ABNORMAL HIGH (ref 0.00–0.07)
Basophils Absolute: 0 10*3/uL (ref 0.0–0.1)
Basophils Relative: 0 %
Eosinophils Absolute: 0.1 10*3/uL (ref 0.0–0.5)
Eosinophils Relative: 2 %
HCT: 36.3 % — ABNORMAL LOW (ref 39.0–52.0)
Hemoglobin: 12.3 g/dL — ABNORMAL LOW (ref 13.0–17.0)
Immature Granulocytes: 1 %
Lymphocytes Relative: 27 %
Lymphs Abs: 1.6 10*3/uL (ref 0.7–4.0)
MCH: 33.5 pg (ref 26.0–34.0)
MCHC: 33.9 g/dL (ref 30.0–36.0)
MCV: 98.9 fL (ref 80.0–100.0)
Monocytes Absolute: 0.5 10*3/uL (ref 0.1–1.0)
Monocytes Relative: 9 %
Neutro Abs: 3.5 10*3/uL (ref 1.7–7.7)
Neutrophils Relative %: 61 %
Platelets: 302 10*3/uL (ref 150–400)
RBC: 3.67 MIL/uL — ABNORMAL LOW (ref 4.22–5.81)
RDW: 11.9 % (ref 11.5–15.5)
WBC: 5.8 10*3/uL (ref 4.0–10.5)
nRBC: 0 % (ref 0.0–0.2)

## 2019-08-18 LAB — MAGNESIUM: Magnesium: 1.9 mg/dL (ref 1.7–2.4)

## 2019-08-18 MED ORDER — METOPROLOL TARTRATE 25 MG PO TABS
25.0000 mg | ORAL_TABLET | Freq: Two times a day (BID) | ORAL | Status: DC
  Administered 2019-08-19: 25 mg
  Filled 2019-08-18: qty 1

## 2019-08-18 MED ORDER — ENOXAPARIN SODIUM 60 MG/0.6ML ~~LOC~~ SOLN
0.5000 mg/kg | SUBCUTANEOUS | Status: DC
  Administered 2019-08-18 – 2019-08-19 (×2): 50 mg via SUBCUTANEOUS
  Filled 2019-08-18 (×2): qty 0.5

## 2019-08-18 NOTE — Evaluation (Signed)
Speech Language Pathology Evaluation Patient Details Name: Andres [name] MRN: [id number] DOB: 1941/03/31 Today's Date: 08/18/2019 Time: [id number] SLP Time Calculation (min) (ACUTE ONLY): 19 min  Problem List:  Patient Active Problem List   Diagnosis Date Noted  . Coagulase negative Staphylococcus bacteremia 08/15/2019  . Acute ischemic stroke ([place]) 08/09/2019  . Transaminitis 08/09/2019  . History of seizures 08/09/2019  . Left hip pain 03/02/2019  . Syncope 09/05/2018  . Gout 07/10/2018  . Scrotal swelling 06/09/2018  . Urinary dribbling 06/03/2017  . Mass of scrotum 06/03/2017  . Injury of left hand 07/21/2015  . Hx of   sessile serrated colonic polyp 02/27/2015  . Chronic anticoagulation 12/22/2014  . Colon cancer screening 12/22/2014  . Morbid obesity ([place]) 09/29/2014  . Mitral valve prolapse 11/24/2013  . IT band syndrome 09/23/2013  . Hypotension, unspecified 07/21/2013  . Dyspnea 07/21/2013  . Elevated troponin I level- (pk Troponin 0.34 in setting of acute renal insufficency) 07/21/2013  . 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 Last nuc 2012 negative. 07/21/2013  . SBO (small bowel obstruction) ([place]) 07/21/2013  . Inguinal hernia, Rt reduced, Lt present 07/21/2013  . Acute renal failure ([place]) 07/21/2013  . Pneumonia, aspiration ([place]) 07/21/2013  . Obesity (BMI 30-39.9) 01/17/2013  . Osteoarthritis, hand, primary localized 08/12/2012  . Preventative health care 04/11/2011  . MILD COGNITIVE IMPAIRMENT SO STATED 10/03/2010  . CEREBROVASCULAR ACCIDENT, HX OF 10/03/2010  . RASH-NONVESICULAR 05/12/2008  . TRANSIENT ISCHEMIC ATTACK 01/27/2008  . Diabetes ([place]) 12/08/2007  . ALLERGIC RHINITIS 12/08/2007  . DIVERTICULOSIS, COLON 12/08/2007  . LOW BACK PAIN 12/08/2007  . NEPHROLITHIASIS, HX OF 12/08/2007  . BPH (benign prostatic hyperplasia) 04/05/2007  . Hyperlipidemia 04/02/2007  . CARPAL TUNNEL SYNDROME, BILATERAL 04/02/2007  . CATARACT NOS 04/02/2007   . Essential hypertension 04/02/2007   Past Medical History:  Past Medical History:  Diagnosis Date  . ALLERGIC RHINITIS   . Benign prostatic hypertrophy   . 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 Last nuc 2012 negative. 07/21/2013  . Carpal tunnel syndrome, bilateral   . Cataracts, bilateral   . Coronary artery disease    Dr. Claiborne Billings; 2D ECHO, 12/11/2011 - EF 50-55%, normal; NUCLEAR STRESS TEST, 10/16/2010 - no evidemce of inducible ischemia  . Diabetes mellitus type II   . Diverticulosis of colon   . GERD (gastroesophageal reflux disease)   . H/O hiatal hernia   . Head mass   . Hepatitis   . Hx of   sessile serrated colonic polyp 02/27/2015  . Hyperlipidemia   . Hypertension   . Inguinal hernia, Rt reduced, Lt present 07/21/2013  . Low back pain   . Mitral valve prolapse 11/24/2013   By echo dec 2014  . Nephrolithiasis   . Obesity, morbid ([place])   . Occlusion and stenosis of carotid artery without mention of cerebral infarction    CAROTID DOPPLER, 03/18/2012 - srable, mild, hard, plaque noted, bilaterally  . Stroke [facility])    Past Surgical History:  Past Surgical History:  Procedure Laterality Date  . APPENDECTOMY    . CARDIAC CATHETERIZATION    . CATARACT EXTRACTION Right   . CORONARY ARTERY BYPASS GRAFT    . INGUINAL HERNIA REPAIR Right 07/26/2013   Procedure: REPAIR  INCARCERATED RIGHT INGUINAL  HERNIA ;  Surgeon: Gwenyth Ober, MD;  Location: [facility];  Service: General;  Laterality: Right;   HPI:  79 y.o. male with history of stroke, HTN, HLD, DB,  CAD, obesity, carotid disease, seizures, found down by son at home confused with L sided weakness. MRI: acute right MCA infarct surrounding old R MCA parietal infarct.    Assessment / Plan / Recommendation Clinical Impression   Patient seen for evaluation of speech/language. Patient presents with receptive>expressive language deficits, mild dysarthria in the setting of R MCA. Patient with waxing/waning LOA during this  assessment. Pt responded accurately to basic yes/no questions in 1/5 opportunities. When asked questions, patient often repeated question back to clinician. For example, when asked "are you in bed?" Pt responding "Am I in bed?" When asked name, patient responding with full name "Andres [name]." Patient with NR to other orientation questions.  Pt with some automatic speech intact related to greetings. Pt did not demonstrate ability to follow 1 step verbal commands consistently. Confrontation naming difficult to assess 2/2 visual deficits and patient having eyes closed for majority of session. When provided visual/verbal/tactile cues (ST holding phone up to patient's ear while asking him to name item), patient did not demonstrate ability to accurately name items. Patient with mildly dysarthric speech noted at the phrase and sentence level. Cognitive-communication assessment limited 2/2 language impairments.  Recommend patient receive speech/language services while in patient and continue ST at the next venue of care.    SLP Assessment  SLP Recommendation/Assessment: Patient needs continued Speech Lanaguage Pathology Services SLP Visit Diagnosis: Aphasia (R47.01);Dysarthria and anarthria (R47.1)    Follow Up Recommendations  Skilled Nursing facility    Frequency and Duration min 2x/week  2 weeks      SLP Evaluation Cognition  Overall Cognitive Status: Impaired/Different from baseline Arousal/Alertness: Lethargic Orientation Level: Oriented to person Attention: Sustained Sustained Attention: Impaired       Comprehension  Auditory Comprehension Overall Auditory Comprehension: Impaired Yes/No Questions: Impaired Basic Biographical Questions: 0-25% accurate Basic Immediate Environment Questions: 0-24% accurate Commands: Impaired One Step Basic Commands: 0-24% accurate Conversation: Simple Interfering Components: Attention;Visual impairments Visual  Recognition/Discrimination Discrimination: Not tested Reading Comprehension Reading Status: Not tested    Expression Expression Primary Mode of Expression: Verbal Verbal Expression Overall Verbal Expression: Impaired Automatic Speech: (unable to assess) Level of Generative/Spontaneous Verbalization: Sentence Naming: Not tested Written Expression Written Expression: Not tested   Oral / Motor  Oral Motor/Sensory Function Overall Oral Motor/Sensory Function: Moderate impairment Facial ROM: Suspected CN VII (facial) dysfunction Facial Symmetry: Abnormal symmetry left;Suspected CN VII (facial) dysfunction Facial Sensation: Suspected CN V (Trigeminal) dysfunction Lingual ROM: Other (Comment)(UNABLE TO ASSESS) Lingual Symmetry: Other (Comment)(UNABLE TO ASSESS) Motor Speech Overall Motor Speech: Impaired Respiration: Within functional limits Phonation: Wet Articulation: Impaired Level of Impairment: Phrase Intelligibility: Intelligibility reduced Word: 75-100% accurate Phrase: 50-74% accurate Sentence: 50-74% accurate Conversation: 25-49% accurate Motor Planning: (difficult to assess)   GO                   Marina Goodell, M.Ed., CCC-SLP Speech Therapy Acute Rehabilitation [phone]  Marina Goodell 08/18/2019, 11:52 AM

## 2019-08-18 NOTE — Progress Notes (Signed)
  Speech Language Pathology Treatment: Dysphagia  Patient Details Name: Andres [name] MRN: [id number] DOB: 1940/09/08 Today's Date: 08/18/2019 Time: [id number] SLP Time Calculation (min) (ACUTE ONLY): 20 min  Assessment / Plan / Recommendation Clinical Impression  Pt seen at bedside for dyphagia treatment with a target of PO readiness. Patient with waxing/waning LOA. Pt unable to follow commands to open mouth. Patient was noted to spontaneously swallow secretions x2 during session. ST unable to elicit cued swallow. Patient with gurgly voice quality, ?backflow from TF. Pt has hx GERD. SLP able to conduct somewhat limited oral care with toothbrush via in-line suction. Pt with missing teeth. Pt with limited response to thermal-tactile stimulation (cold) on lips, limited movement. SLP provided ice chip, patient with very limited bolus acceptance, opening lips slightly to accept, but not attempting to orally grasp or move. Ice chip sitting in front of incisors and melting, pt with anterior labial spillage. SLP attempted ice chip again, pt with limited awareness of bolus, not attempting to open mouth to accept despite tactile/verbal cues. Pt presented with small amount of thin liquid water via spoon, pt did not attempt to accept water, anterior labial spillage observed. Patient will benefit from [facility] once mentation improves. Recommend continue NPO, ensure patient in upright positioning when receiving TFs to reduce risk of aspiration. ST to re-assess for PO readiness and follow acutely.    HPI HPI: 79 y.o. male with history of stroke, HTN, HLD, DB, CAD, obesity, carotid disease, seizures, found down by son at home confused with L sided weakness. MRI: acute right MCA infarct surrounding old R MCA parietal infarct.       SLP Plan  Continue with current plan of care  Patient needs continued Speech Lanaguage Pathology Services    Recommendations  Diet recommendations: NPO Medication Administration: Via  alternative means Postural Changes and/or Swallow Maneuvers: Seated upright 90 degrees   May benefit from GI referral              Oral Care Recommendations: Oral care QID Follow up Recommendations: Skilled Nursing facility SLP Visit Diagnosis: Aphasia (R47.01);Dysarthria and anarthria (R47.1) Plan: Continue with current plan of care       Morris, M.Ed., [place] Speech Therapy Acute Rehabilitation [phone] pager  Latrina Guttman 08/18/2019, 12:08 PM

## 2019-08-18 NOTE — Progress Notes (Signed)
Patient ID: Andres [name], male   DOB: March 31, 1941, 79 y.o.   MRN: [id number]  PROGRESS NOTE    Andres [name]  [id number] DOB: 11-19-1940 DOA: 08/09/2019 PCP: [name], MD   Brief Narrative:  79 year old male with history of hypertension, hyperlipidemia, CAD status post CABG, carotid artery stenosis, unspecified CVA, mitral valve prolapse, diabetes mellitus type 2, BPH, hepatitis, GERD presented after being found down with associated left-sided weakness and dysarthria.  He was found to have acute right MCA stroke.  Neurology was consulted.  He was also found to have staph capitis bacteremia for which ID was consulted.  Assessment & Plan:   Acute right MCA stroke -Presented with left sided weakness and dysarthria.  MRI significant for large area of acute right MCA territory infarct -LDL 100.  A1c 6.2 -Transthoracic echo showed no evidence of embolic source -PT is recommending SNF/CIR placement.  CIR following.  Social worker also has been consulted. -SLP following.  Currently n.p.o.  Continue feeding via cortrak.  If swallowing does not improve in the near future, patient might need PEG tube feeding. -Neurology has signed off: Continue dual antiplatelet therapy for 3 months followed by Plavix alone.  Outpatient follow-up with neurology.  Acute metabolic encephalopathy/delirium/lethargy -Mental status has slightly improved after discontinuation of Ativan.  Monitor.  Staph capitis bacteremia -Currently on Ancef.  Repeat cultures are negative so far.  ID has signed off and recommend Ancef till 08/19/2019  History of seizures -EEG showed no evidence of seizures.  Patient was having left-sided tremors and there was a concern for subclinical seizures.   -CT of the head showed progression of right MCA territory infarct  -neurology following.  Continue Keppra 750 mg twice daily.  Use Ativan only for generalized tonic-clonic seizure lasting more than 2 minutes or focal seizure lasting more  than 5 minutes  Diabetes mellitus type 2 -A1c 6.2.  Not on any medications as outpatient  Essential hypertension -Blood pressure stable.  Continue oral metoprolol.  Losartan and hydrochlorothiazide on hold.  Might need to restart if blood pressure still elevated.  CAD with history of CABG in 1997 -Continue aspirin, Plavix, Lipitor.  Outpatient follow-up with cardiology  Chronic kidney disease stage III -Stable  BPH -Continue Flomax  Hyperlipidemia -Continue Lipitor  Obesity -Outpatient follow-up  DVT prophylaxis: SCDs Code Status: Full Family Communication: Spoke to daughter/Donna on phone on 08/17/2018 with Disposition Plan: CIR versus SNF once bed is available  Consultants: Neurology/infectious disease  Procedures:   08/09/2019: Transthoracic Echocardiogram IMPRESSIONS   1. Left ventricular ejection fraction, by visual estimation, is 60%. The left ventricle has normal function. There is no left ventricular hypertrophy. 2. Abnormal septal motion secondary to conduction delay. 3. Elevated left ventricular end-diastolic pressure. 4. Left ventricular diastolic parameters are consistent with Grade II diastolic dysfunction (pseudonormalization). 5. Global right ventricle has normal systolic function.The right ventricular size is normal. No increase in right ventricular wall thickness. 6. Left atrial size was normal. 7. Right atrial size was normal. 8. The mitral valve is abnormal. Trivial mitral valve regurgitation. No evidence of mitral stenosis. 9. Mild bileaflet mitral valve prolapse. 10. The tricuspid valve is normal in structure. 11. The aortic valve is normal in structure. Aortic valve regurgitation is not visualized. No evidence of aortic valve sclerosis or stenosis. 12. The pulmonic valve was normal in structure. Pulmonic valve regurgitation is trivial. 13. The inferior vena cava is normal in size with greater than 50% respiratory variability, suggesting  right atrial pressure of  3 mmHg. 14. The interatrial septum was not well visualized. 15. No intracardiac source of emboli noted.   1/5: EEG DESCRIPTION:No clear posterior dominant rhythm was seen. EEG showed continuous generalized and lateralized right hemisphere 3-6hz  theta-delta slowing. Hyperventilation and photic stimulation were not performed.  ABNORMALITY - Continuous slow, generalized and lateralized right hemisphere  IMPRESSION: This studyis suggestive of cortical dysfunction in right hemisphere consistent with underlying infarct. Additionally, there is evidence of moderate diffuse encephalopathy.No seizures or epileptiform discharges were seen throughout the recording.  Antimicrobials:  Anti-infectives (From admission, onward)   Start     Dose/Rate Route Frequency Ordered Stop   08/15/19 0730  ceFAZolin (ANCEF) IVPB 2g/100 mL premix     2 g 200 mL/hr over 30 Minutes Intravenous Every 8 hours 08/15/19 0636 08/19/19 2359       Subjective: Patient seen and examined at bedside.  Poor historian.  Sleepy, wakes up slightly, very slow to respond to questions.  No overnight fever or vomiting reported. Objective: Vitals:   08/17/19 1930 08/17/19 2324 08/18/19 0341 08/18/19 0500  BP: 139/82 (!) 149/88 136/77   Pulse: 81 85 87   Resp: 18 18 20    Temp: 98.5 F (36.9 C) 98.9 F (37.2 C) 98.6 F (37 C)   TempSrc: Oral  Oral   SpO2: 98% 100% 97%   Weight:    104.8 kg  Height:        Intake/Output Summary (Last 24 hours) at 08/18/2019 0759 Last data filed at 08/18/2019 0645 Gross per 24 hour  Intake -  Output 1160 ml  Net -1160 ml   Filed Weights   08/15/19 1959 08/17/19 0500 08/18/19 0500  Weight: 103.4 kg 103.8 kg 104.8 kg    Examination:  General exam: No acute distress.  Looks chronically ill.  Wakes up slightly, very poor historian, very slow to respond to questions.  Dysarthric.  Cortrak in place Respiratory system: Bilateral decreased breath sounds at  bases with some scattered crackles Cardiovascular system: Rate controlled, S1-S2 heard Gastrointestinal system: Abdomen is nondistended, soft and nontender. Normal bowel sounds heard. Extremities: No cyanosis; trace lower extremity edema   Data Reviewed: I have personally reviewed following labs and imaging studies  CBC: Recent Labs  Lab 08/14/19 0932 08/15/19 1051 08/18/19 0329  WBC 7.2 6.6 5.8  NEUTROABS  --   --  3.5  HGB 11.2* 10.9* 12.3*  HCT 33.1* 32.9* 36.3*  MCV 100.0 101.5* 98.9  PLT 194 214 [id number]   Basic Metabolic Panel: Recent Labs  Lab 08/12/19 0505 08/14/19 0428 08/15/19 1051 08/18/19 0329  NA 138 139 142 139  K 4.3 4.2 4.1 4.7  CL 102 104 105 101  CO2 20* 24 30 27   GLUCOSE 89 180* 156* 156*  BUN 12 17 16 22   CREATININE 1.24 1.11 1.15 1.16  CALCIUM 9.1 9.0 9.1 9.5  MG  --  1.7  --  1.9   GFR: Estimated Creatinine Clearance: 58.6 mL/min (by C-G formula based on SCr of 1.16 mg/dL). Liver Function Tests: Recent Labs  Lab 08/15/19 1051  AST 37  ALT 30  ALKPHOS 61  BILITOT 0.4  PROT 6.0*  ALBUMIN 2.6*   No results for input(s): LIPASE, AMYLASE in the last 168 hours. Recent Labs  Lab 08/15/19 1051  AMMONIA 19   Coagulation Profile: No results for input(s): INR, PROTIME in the last 168 hours. Cardiac Enzymes: No results for input(s): CKTOTAL, CKMB, CKMBINDEX, TROPONINI in the last 168 hours. BNP (last 3 results) No  results for input(s): PROBNP in the last 8760 hours. HbA1C: No results for input(s): HGBA1C in the last 72 hours. CBG: Recent Labs  Lab 08/17/19 1150 08/17/19 1619 08/17/19 2115 08/18/19 0015 08/18/19 0338  GLUCAP 146* 168* 150* 138* 159*   Lipid Profile: No results for input(s): CHOL, HDL, LDLCALC, TRIG, CHOLHDL, LDLDIRECT in the last 72 hours. Thyroid Function Tests: Recent Labs    08/15/19 1051  TSH 1.076   Anemia Panel: No results for input(s): VITAMINB12, FOLATE, FERRITIN, TIBC, IRON, RETICCTPCT in the last 72  hours. Sepsis Labs: No results for input(s): PROCALCITON, LATICACIDVEN in the last 168 hours.  Recent Results (from the past 240 hour(s))  SARS CORONAVIRUS 2 (TAT 6-24 HRS) Nasopharyngeal Nasopharyngeal Swab     Status: None   Collection Time: 08/09/19 11:30 AM   Specimen: Nasopharyngeal Swab  Result Value Ref Range Status   SARS Coronavirus 2 NEGATIVE NEGATIVE Final    Comment: (NOTE) SARS-CoV-2 target nucleic acids are NOT DETECTED. The SARS-CoV-2 RNA is generally detectable in upper and lower respiratory specimens during the acute phase of infection. Negative results do not preclude SARS-CoV-2 infection, do not rule out co-infections with other pathogens, and should not be used as the sole basis for treatment or other patient management decisions. Negative results must be combined with clinical observations, patient history, and epidemiological information. The expected result is Negative. Fact Sheet for Patients: SugarRoll.be Fact Sheet for Healthcare Providers: https://www.woods-mathews.com/ This test is not yet approved or cleared by the [place] FDA and  has been authorized for detection and/or diagnosis of SARS-CoV-2 by FDA under an Emergency Use Authorization (EUA). This EUA will remain  in effect (meaning this test can be used) for the duration of the COVID-19 declaration under Section 56 4(b)(1) of the Act, 21 U.S.C. section 360bbb-3(b)(1), unless the authorization is terminated or revoked sooner. Performed at [facility], [place] [address] [place], [place] [address]   Culture, blood (routine x 2)     Status: Abnormal   Collection Time: 08/14/19  9:32 AM   Specimen: BLOOD RIGHT HAND  Result Value Ref Range Status   Specimen Description BLOOD RIGHT HAND  Final   Special Requests   Final    BOTTLES DRAWN AEROBIC ONLY Blood Culture adequate volume   Culture  Setup Time   Final    AEROBIC BOTTLE ONLY GRAM POSITIVE  COCCI CRITICAL VALUE NOTED.  VALUE IS CONSISTENT WITH PREVIOUSLY REPORTED AND CALLED VALUE. Performed at [facility], [place] [address], [place], [place] [address]    Culture STAPHYLOCOCCUS CAPITIS (A)  Final   Report Status 08/17/2019 FINAL  Final   Organism ID, Bacteria STAPHYLOCOCCUS CAPITIS  Final      Susceptibility   Staphylococcus capitis - MIC*    CIPROFLOXACIN <=0.5 SENSITIVE Sensitive     ERYTHROMYCIN <=0.25 SENSITIVE Sensitive     GENTAMICIN <=0.5 SENSITIVE Sensitive     OXACILLIN SENSITIVE Sensitive     TETRACYCLINE <=1 SENSITIVE Sensitive     VANCOMYCIN 1 SENSITIVE Sensitive     TRIMETH/SULFA <=10 SENSITIVE Sensitive     CLINDAMYCIN <=0.25 SENSITIVE Sensitive     RIFAMPIN <=0.5 SENSITIVE Sensitive     Inducible Clindamycin NEGATIVE Sensitive     * STAPHYLOCOCCUS CAPITIS  Culture, blood (routine x 2)     Status: Abnormal   Collection Time: 08/14/19  9:32 AM   Specimen: BLOOD RIGHT WRIST  Result Value Ref Range Status   Specimen Description BLOOD RIGHT WRIST  Final  Special Requests   Final    BOTTLES DRAWN AEROBIC ONLY Blood Culture adequate volume   Culture  Setup Time   Final    AEROBIC BOTTLE ONLY GRAM POSITIVE COCCI CRITICAL RESULT CALLED TO, READ BACK BY AND VERIFIED WITH: PHARMD C ROBERTSON [id number] AT 7 AN BY CM Performed at [facility], [place] [address] [place], [place] [address]    Culture STAPHYLOCOCCUS CAPITIS (A)  Final   Report Status 08/17/2019 FINAL  Final   Organism ID, Bacteria STAPHYLOCOCCUS CAPITIS  Final      Susceptibility   Staphylococcus capitis - MIC*    CIPROFLOXACIN <=0.5 SENSITIVE Sensitive     ERYTHROMYCIN <=0.25 SENSITIVE Sensitive     GENTAMICIN <=0.5 SENSITIVE Sensitive     OXACILLIN <=0.25 SENSITIVE Sensitive     TETRACYCLINE <=1 SENSITIVE Sensitive     VANCOMYCIN 1 SENSITIVE Sensitive     TRIMETH/SULFA <=10 SENSITIVE Sensitive     CLINDAMYCIN <=0.25 SENSITIVE Sensitive     RIFAMPIN <=0.5 SENSITIVE Sensitive      Inducible Clindamycin NEGATIVE Sensitive     * STAPHYLOCOCCUS CAPITIS  Blood Culture ID Panel (Reflexed)     Status: Abnormal   Collection Time: 08/14/19  9:32 AM  Result Value Ref Range Status   Enterococcus species NOT DETECTED NOT DETECTED Final   Listeria monocytogenes NOT DETECTED NOT DETECTED Final   Staphylococcus species DETECTED (A) NOT DETECTED Final    Comment: Methicillin (oxacillin) susceptible coagulase negative staphylococcus. Possible blood culture contaminant (unless isolated from more than one blood culture draw or clinical case suggests pathogenicity). No antibiotic treatment is indicated for blood  culture contaminants. CRITICAL RESULT CALLED TO, READ BACK BY AND VERIFIED WITH: PHARMD C ROBERTSON [id number] AT 625 AM BY CM    Staphylococcus aureus (BCID) NOT DETECTED NOT DETECTED Final   Methicillin resistance NOT DETECTED NOT DETECTED Final   Streptococcus species NOT DETECTED NOT DETECTED Final   Streptococcus agalactiae NOT DETECTED NOT DETECTED Final   Streptococcus pneumoniae NOT DETECTED NOT DETECTED Final   Streptococcus pyogenes NOT DETECTED NOT DETECTED Final   Acinetobacter baumannii NOT DETECTED NOT DETECTED Final   Enterobacteriaceae species NOT DETECTED NOT DETECTED Final   Enterobacter cloacae complex NOT DETECTED NOT DETECTED Final   Escherichia coli NOT DETECTED NOT DETECTED Final   Klebsiella oxytoca NOT DETECTED NOT DETECTED Final   Klebsiella pneumoniae NOT DETECTED NOT DETECTED Final   Proteus species NOT DETECTED NOT DETECTED Final   Serratia marcescens NOT DETECTED NOT DETECTED Final   Haemophilus influenzae NOT DETECTED NOT DETECTED Final   Neisseria meningitidis NOT DETECTED NOT DETECTED Final   Pseudomonas aeruginosa NOT DETECTED NOT DETECTED Final   Candida albicans NOT DETECTED NOT DETECTED Final   Candida glabrata NOT DETECTED NOT DETECTED Final   Candida krusei NOT DETECTED NOT DETECTED Final   Candida parapsilosis NOT DETECTED NOT  DETECTED Final   Candida tropicalis NOT DETECTED NOT DETECTED Final    Comment: Performed at [facility], [place]. [address] [place], [place] [address]  Culture, Urine     Status: Abnormal   Collection Time: 08/14/19  3:49 PM   Specimen: Urine, Clean Catch  Result Value Ref Range Status   Specimen Description URINE, CLEAN CATCH  Final   Special Requests   Final    NONE Performed at [facility], [place] [address], [place], [place] [address]    Culture MULTIPLE SPECIES PRESENT, SUGGEST RECOLLECTION (A)  Final   Report Status 08/15/2019 FINAL  Final  Culture, blood (routine x 2)     Status: None (Preliminary result)   Collection Time: 08/15/19  2:30 PM   Specimen: BLOOD  Result Value Ref Range Status   Specimen Description BLOOD RIGHT ANTECUBITAL  Final   Special Requests   Final    BOTTLES DRAWN AEROBIC AND ANAEROBIC Blood Culture adequate volume   Culture   Final    NO GROWTH 2 DAYS Performed at [facility], [address], [place], [place] [address]    Report Status PENDING  Incomplete  Culture, blood (routine x 2)     Status: None (Preliminary result)   Collection Time: 08/15/19  2:35 PM   Specimen: BLOOD  Result Value Ref Range Status   Specimen Description BLOOD LEFT ANTECUBITAL  Final   Special Requests   Final    BOTTLES DRAWN AEROBIC AND ANAEROBIC Blood Culture adequate volume   Culture   Final    NO GROWTH 2 DAYS Performed at [facility], [place] [address] [place], [place] [address]    Report Status PENDING  Incomplete         Radiology Studies: No results found.      Scheduled Meds: .  stroke: mapping our early stages of recovery book   Does not apply Once  . aspirin  81 mg Per Tube Daily   Or  . aspirin  300 mg Rectal Daily  . atorvastatin  40 mg Per Tube Daily  . clopidogrel  75 mg Per Tube Daily  . feeding supplement (PRO-STAT SUGAR FREE 64)  30 mL Per Tube Daily  . insulin aspart  0-9 Units Subcutaneous Q4H  . metoprolol  tartrate  25 mg Oral BID  . tamsulosin  0.4 mg Oral Daily   Continuous Infusions: .  ceFAZolin (ANCEF) IV 2 g (08/18/19 0045)  . feeding supplement (JEVITY 1.2 CAL) 65 mL/hr at 08/16/19 1353  . levETIRAcetam 750 mg (08/17/19 2249)          Aline August, MD [facility] 08/18/2019, 7:59 AM

## 2019-08-18 NOTE — PMR Pre-admission (Addendum)
PMR Admission Coordinator Pre-Admission Assessment  Patient: Andres [name] is an 79 y.o., male MRN: [id number] DOB: Dec 29, 1940 Height: 5\' 7"  (170.2 cm) Weight: 105 kg              Insurance Information HMO:     PPO: yes     PCP:      IPA:      80/20:      OTHER:  PRIMARY: [employer] Medicare advantage plan      Policy#: [id number]      Subscriber: pt CM Name: [facility] who manages [facility]      Phone#: [phone]     Fax#: [id number] Pre-Cert#: [id number] approved until 1/22 but updates due 1/19 though    Employer:  Benefits:  Phone # [phone]     Name: 1/14 Eff. Date: 08/05/2019     Deduct: none      Out of Pocket Max: we are listed as out of network so $10,000/ in network is $5500      Life Max: none CIR: out of network 65% / in network is $325 co pay per days days 1 until 5      SNF: out of network and in network is no co pay days 1 until 20; $184 co pay per dyad days 21 until 100 100 days per year Outpatient: out of network is 65% / in network is $40 co pay     Co-Pay: visits per medical neccesity Home Health: out of network is 65% / in network is 100%      Co-Pay: visits per medical neccesity DME: 80%     Co-Pay: 20% Providers: we are out of network; in network also quoted above  SECONDARY: none      Medicaid Application Date:       Case Manager:  Disability Application Date:       Case Worker:   The "Data Collection Information Summary" for patients in Inpatient Rehabilitation Facilities with attached "Privacy Act [place] Records" was provided and verbally reviewed with: Family  Emergency Contact Information Contact Information     Name Relation Home Work Mobile   [place] Daughter [phone]     [facility], [place] [phone]  [phone]      Current Medical History  Patient Admitting Diagnosis: CVA  History of Present Illness:79 year old right-handed male with history of hypertension, CKD stage III, morbid obesity with BMI  35.70, hyperlipidemia, history of seizure maintained on Keppra, CAD status post CABG, history of CVA maintained on aspirin 81 mg daily and Plavix, carotid artery stenosis, mitral valve prolapse, diet-controlled diabetes mellitus, BPH and remote tobacco abuse.   Presented 08/09/2019 after being found down the left side weakness and slurred speech.  Cranial CT scan showed hyperdense right middle cerebral artery distal M1 and proximal M2 segments.  No acute hemorrhage.  Patient did not receive TPA.  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  Carotid and aortic atherosclerosis without stenosis.  MRI showed large area of acute infarction right MCA territory surrounding chronic infarct in the right parietal lobe.  Echocardiogram with ejection fraction of 60% without emboli.  EEG negative for seizure.  Admission chemistries with creatinine 1.35, SARS coronavirus negative, hemoglobin 13.8.  Neurology follow-up currently remains on aspirin and Plavix therapy for CVA prophylaxis.  Keppra ongoing as prior to admission for history of seizure disorder.  Blood cultures did show coagulase-negative staph question contaminant and currently remains on  cefazolin per infectious disease with repeat blood cultures pending and no current plan for TEE.  Patient currently remains n.p.o. with alternative means of nutritional support.    Complete NIHSS TOTAL: 15 Glasgow Coma Scale Score: 14  Past Medical History  Past Medical History:  Diagnosis Date   ALLERGIC RHINITIS    Benign prostatic hypertrophy    CAD (coronary artery disease), hx of CABG 1997 X 6. Last nuc 2012 negative. 07/21/2013   Carpal tunnel syndrome, bilateral    Cataracts, bilateral    Coronary artery disease    Dr. Claiborne Billings; 2D ECHO, 12/11/2011 - EF 50-55%, normal; NUCLEAR STRESS TEST, 10/16/2010 - no evidemce of inducible ischemia   Diabetes mellitus type II    Diverticulosis of colon    GERD  (gastroesophageal reflux disease)    H/O hiatal hernia    Head mass    Hepatitis    Hx of   sessile serrated colonic polyp 02/27/2015   Hyperlipidemia    Hypertension    Inguinal hernia, Rt reduced, Lt present 07/21/2013   Low back pain    Mitral valve prolapse 11/24/2013   By echo dec 2014   Nephrolithiasis    Obesity, morbid ([place])    Occlusion and stenosis of carotid artery without mention of cerebral infarction    CAROTID DOPPLER, 03/18/2012 - srable, mild, hard, plaque noted, bilaterally   Stroke [facility])     Family History  family history includes Alzheimer's disease in his father; Diabetes in his brother, mother, and sister; Heart attack in his brother, maternal grandmother, and mother; Hypertension in his maternal grandmother and son; Kidney disease in his brother and son; Pancreatic cancer in his brother.  Prior Rehab/Hospitalizations:  Has the patient had prior rehab or hospitalizations prior to admission? Yes  Has the patient had major surgery during 100 days prior to admission? No  Current Medications   Current Facility-Administered Medications:     stroke: mapping our early stages of recovery book, , Does not apply, Once, Norval [name], MD   acetaminophen (TYLENOL) tablet 650 mg, 650 mg, Oral, Q4H PRN, 650 mg at 08/18/19 1038 **OR** acetaminophen (TYLENOL) 160 MG/5ML solution 650 mg, 650 mg, Per Tube, Q4H PRN, 650 mg at 08/14/19 1605 **OR** acetaminophen (TYLENOL) suppository 650 mg, 650 mg, Rectal, Q4H PRN, Tamala Julian, Rondell A, MD   aspirin suppository 300 mg, 300 mg, Rectal, Daily, 300 mg at 08/16/19 0935 **OR** aspirin chewable tablet 81 mg, 81 mg, Per Tube, Daily, Mariel Aloe, MD, 81 mg at 08/18/19 0957   atorvastatin (LIPITOR) tablet 40 mg, 40 mg, Per Tube, Daily, Mariel Aloe, MD, 40 mg at 08/18/19 0956   ceFAZolin (ANCEF) IVPB 2g/100 mL premix, 2 g, Intravenous, Q8H, Michel Bickers, MD, Last Rate: 200 mL/hr at 08/19/19 0126, 2 g at 08/19/19 0126   clopidogrel  (PLAVIX) tablet 75 mg, 75 mg, Per Tube, Daily, Mariel Aloe, MD, 75 mg at 08/18/19 0957   diclofenac sodium (VOLTAREN) 1 % transdermal gel 2 g, 2 g, Topical, Q6H PRN, Smith, Rondell A, MD   enoxaparin (LOVENOX) injection 50 mg, 0.5 mg/kg, Subcutaneous, Q24H, Alekh, Kshitiz, MD, 50 mg at 08/18/19 1611   feeding supplement (JEVITY 1.2 CAL) liquid 1,000 mL, 1,000 mL, Per Tube, Continuous, Mariel Aloe, MD, Last Rate: 65 mL/hr at 08/16/19 1353, Restarted at 08/16/19 1353   feeding supplement (PRO-STAT SUGAR FREE 64) liquid 30 mL, 30 mL, Per Tube, Daily, Mariel Aloe, MD, 30 mL at 08/18/19 0954   insulin  aspart (novoLOG) injection 0-9 Units, 0-9 Units, Subcutaneous, Q4H, Kirby-Graham, Karsten Fells, NP, 1 Units at 08/19/19 0520   levETIRAcetam (KEPPRA) 750 mg in sodium chloride 0.9 % 100 mL IVPB, 750 mg, Intravenous, Q12H, Greta Doom, MD, Last Rate: 430 mL/hr at 08/18/19 2158, 750 mg at 08/18/19 2158   LORazepam (ATIVAN) injection 1 mg, 1 mg, Intravenous, PRN, Lora Havens, MD   metoprolol tartrate (LOPRESSOR) tablet 25 mg, 25 mg, Per Tube, BID, Blount, Scarlette Shorts T, NP   polyethylene glycol (MIRALAX / GLYCOLAX) packet 17 g, 17 g, Oral, Daily PRN, Mariel Aloe, MD, 17 g at 08/16/19 1846   tamsulosin (FLOMAX) capsule 0.4 mg, 0.4 mg, Oral, Daily, Tamala Julian, Rondell A, MD, 0.4 mg at 08/18/19 0957  Patients Current Diet:  Diet Order             Diet NPO time specified  Diet effective now                Precautions / Restrictions Precautions Precautions: Fall Restrictions Weight Bearing Restrictions: No   Has the patient had 2 or more falls or a fall with injury in the past year?No  Prior Activity Level Community (5-7x/wk): independent   Prior Functional Level Prior Function Level of Independence: Independent Comments: walking in neighborhood to get to church  Self Care: Did the patient need help bathing, dressing, using the toilet or eating?  Independent  Indoor  Mobility: Did the patient need assistance with walking from room to room (with or without device)? Independent  Stairs: Did the patient need assistance with internal or external stairs (with or without device)? Independent  Functional Cognition: Did the patient need help planning regular tasks such as shopping or remembering to take medications? Independent  Home Assistive Devices / Equipment Home Assistive Devices/Equipment: None Home Equipment: Cane - single point  Prior Device Use: Indicate devices/aids used by the patient prior to current illness, exacerbation or injury?  cane  Current Functional Level Cognition  Arousal/Alertness: Lethargic Overall Cognitive Status: Impaired/Different from baseline Current Attention Level: Focused Orientation Level: Oriented to person Following Commands: Follows one step commands inconsistently, Follows one step commands with increased time Safety/Judgement: Decreased awareness of safety, Decreased awareness of deficits General Comments: Pt performed mobility with increased effort.  He has moments where he looses focus and requires increased time to rengage in activity. Attention: Sustained Sustained Attention: Impaired    Extremity Assessment (includes Sensation/Coordination)  Upper Extremity Assessment: LUE deficits/detail, Difficult to assess due to impaired cognition LUE Deficits / Details: Noting minimal active movement of left UE LUE Coordination: decreased gross motor, decreased fine motor  Lower Extremity Assessment: Defer to PT evaluation    ADLs  Overall ADL's : Needs assistance/impaired Eating/Feeding: NPO Grooming: Moderate assistance Upper Body Bathing: Maximal assistance, Sitting, Bed level Lower Body Bathing: Maximal assistance, +2 for physical assistance, Sit to/from stand, Bed level Upper Body Dressing : Maximal assistance, Sitting, Bed level Lower Body Dressing: Maximal assistance, Sit to/from stand, Bed level Toilet  Transfer: Maximal assistance, +2 for physical assistance(simulated at EOB) Toileting- Clothing Manipulation and Hygiene: Total assistance, +2 for physical assistance, Bed level Toileting - Clothing Manipulation Details (indicate cue type and reason): Pt requiring Total A for peri care after urinary incontience in the bed. Pt with deceased following of commands for rolling in bed.  Functional mobility during ADLs: Total assistance(bed mobility only) General ADL Comments: Pt presenting with decreased arousal and significant lethargy. Pt with urinary incontience in bed and requiring Total A  for bed mobility and peri care. Repositioning in bed and placing bed in chair position. Pt occasionally opening eyes to his name.     Mobility  Overal bed mobility: Needs Assistance Bed Mobility: Rolling Rolling: Total assist, +2 for physical assistance Sidelying to sit: Max assist Sit to supine: Max assist, +2 for physical assistance General bed mobility comments: Pt requiring Total A +2 for rolling in bed during toileting as pt with urinary incontience. Requiring Total A +2 for repositioning. Pt actively moving all 4 extremities, esp LLE and RUE, but not in an assistive way, sometimes resistive.      Transfers  Overall transfer level: Needs assistance Equipment used: Ambulation equipment used(sara stedy.) Transfers: Sit to/from Stand Sit to Stand: +2 physical assistance, Max assist(Mod +1 during first sit to stand and max +2 for addtional sit to stand when patient not following commands.) General transfer comment: not alert enough to attempt transfers    Ambulation / Gait / Stairs / Wheelchair Mobility  Ambulation/Gait Ambulation/Gait assistance: (NT) General Gait Details: unable    Posture / Balance Dynamic Sitting Balance Sitting balance - Comments: Posterior push and lean. Balance Overall balance assessment: Needs assistance Sitting-balance support: No upper extremity supported, Feet  supported Sitting balance-Leahy Scale: Poor Sitting balance - Comments: Posterior push and lean. Standing balance support: Single extremity supported, During functional activity Standing balance-Leahy Scale: Poor Standing balance comment: +2 external assistance to maintain standing balance.    Special needs/care consideration BiPAP/CPAP n/a CPM n/a Continuous Drip IV n/a Dialysis n/a Life Vest n/a Oxygen n/a Special Bed seizure precautions Trach Size n/a Wound Vac n/a Skin intact Bowel mgmt: incontinent Bladder mgmt: external catheter Diabetic mgmt  Behavioral consideration  N/a Chemo/radiation  N/a Designated visitor is daughter, Franklyn Lor for tube feeds placed 1/8 43 inches 10 fr left nare   Previous Home Environment  Living Arrangements: Alone  Lives With: Son Available Help at Discharge: Family, Available 24 hours/day Type of Home: House Home Layout: One level Home Access: Stairs to enter Entrance Stairs-Rails: None Technical brewer of Steps: 2 Bathroom Shower/Tub: Public librarian, Industrial/product designer: Yes How Accessible: Accessible via walker [place]: No Additional Comments: son live with pt pta  Discharge Living Setting Plans for Discharge Living Setting: Lives with (comment) Type of Home at Discharge: House Discharge Home Layout: One level Alternate Level Stairs-Rails: None Discharge Home Access: Stairs to enter Entrance Stairs-Rails: None Entrance Stairs-Number of Steps: 1 Discharge Bathroom Shower/Tub: Tub only, Walk-in shower Discharge Bathroom Toilet: Standard Discharge Bathroom Accessibility: Yes How Accessible: Accessible via walker Does the patient have any problems obtaining your medications?: No  Social/Family/Support Systems Patient Roles: Parent Contact Information: [name], daughter Anticipated Caregiver: [name], daughter and hired caregivers Anticipated Ambulance person  Information: [phone] Ability/Limitations of Caregiver: [name] works for Health care SNF facilities in some capacity Caregiver Availability: 24/7(wiht hired caregivers) Discharge Plan Discussed with Primary Caregiver: Yes Is Caregiver In Agreement with Plan?: Yes Does Caregiver/Family have Issues with Lodging/Transportation while Pt is in Rehab?: No  Goals/Additional Needs Patient/Family Goal for Rehab: supervision to min asisst with PT, OT, and Min/Mod A SLP Expected length of stay: ELOS 20-25 days. Dietary Needs: CORTRAKE for tube feeds Special Service Needs: Patient to move to [place] with [name], daughter Pt/Family Agrees to Admission and willing to participate: Yes Program Orientation Provided & Reviewed with Pt/Caregiver Including Roles  & Responsibilities: Yes  Decrease burden of Care through IP rehab admission:   Possible need for SNF placement  upon discharge:  Patient Condition: This patient's medical and functional status has changed since the consult dated: 08/15/2019 in which the Rehabilitation Physician determined and documented that the patient's condition is appropriate for intensive rehabilitative care in an inpatient rehabilitation facility. See "History of Present Illness" (above) for medical update. Functional changes are: mod to max assist. Patient's medical and functional status update has been discussed with the Rehabilitation physician and patient remains appropriate for inpatient rehabilitation. Will admit to inpatient rehab today.  Preadmission Screen Completed By:  Cleatrice Burke, RN, 08/19/2019 10:58 AM ______________________________________________________________________   Discussed status with Dr. Posey Pronto on 08/19/2019 at 36 and received approval for admission today.  Admission Coordinator:  Cleatrice Burke, time 1100 Date 08/19/2019

## 2019-08-18 NOTE — Progress Notes (Signed)
Director spoke with daughter per daughter's request.  Daughter requests patient up to chair.  Daughter will be here from 5:30 to 6:00 tomorrow.  Director shared that patent would be placed in the chair position in his current bed.  Nursing and physical therapy to place patient in chair tomorrow.

## 2019-08-19 ENCOUNTER — Inpatient Hospital Stay (HOSPITAL_COMMUNITY)
Admission: RE | Admit: 2019-08-19 | Discharge: 2019-09-09 | DRG: 040 | Disposition: A | Discharge status: Other Acute Inpatient Hospital | Payer: Medicare (Managed Care) | Source: Intra-hospital | Attending: Physical Medicine & Rehabilitation | Admitting: Physical Medicine & Rehabilitation

## 2019-08-19 DIAGNOSIS — Z20822 Contact with and (suspected) exposure to covid-19: Secondary | ICD-10-CM | POA: Diagnosis present

## 2019-08-19 DIAGNOSIS — G40909 Epilepsy, unspecified, not intractable, without status epilepticus: Secondary | ICD-10-CM | POA: Diagnosis present

## 2019-08-19 DIAGNOSIS — R32 Unspecified urinary incontinence: Secondary | ICD-10-CM | POA: Diagnosis present

## 2019-08-19 DIAGNOSIS — Z87891 Personal history of nicotine dependence: Secondary | ICD-10-CM | POA: Diagnosis not present

## 2019-08-19 DIAGNOSIS — Z931 Gastrostomy status: Secondary | ICD-10-CM | POA: Diagnosis not present

## 2019-08-19 DIAGNOSIS — I69391 Dysphagia following cerebral infarction: Secondary | ICD-10-CM | POA: Diagnosis not present

## 2019-08-19 DIAGNOSIS — N400 Enlarged prostate without lower urinary tract symptoms: Secondary | ICD-10-CM

## 2019-08-19 DIAGNOSIS — D62 Acute posthemorrhagic anemia: Secondary | ICD-10-CM | POA: Diagnosis present

## 2019-08-19 DIAGNOSIS — E1122 Type 2 diabetes mellitus with diabetic chronic kidney disease: Secondary | ICD-10-CM | POA: Diagnosis present

## 2019-08-19 DIAGNOSIS — I2510 Atherosclerotic heart disease of native coronary artery without angina pectoris: Secondary | ICD-10-CM | POA: Diagnosis present

## 2019-08-19 DIAGNOSIS — E860 Dehydration: Secondary | ICD-10-CM | POA: Diagnosis present

## 2019-08-19 DIAGNOSIS — R0989 Other specified symptoms and signs involving the circulatory and respiratory systems: Secondary | ICD-10-CM | POA: Diagnosis not present

## 2019-08-19 DIAGNOSIS — I639 Cerebral infarction, unspecified: Secondary | ICD-10-CM | POA: Diagnosis not present

## 2019-08-19 DIAGNOSIS — I129 Hypertensive chronic kidney disease with stage 1 through stage 4 chronic kidney disease, or unspecified chronic kidney disease: Secondary | ICD-10-CM | POA: Diagnosis present

## 2019-08-19 DIAGNOSIS — R1310 Dysphagia, unspecified: Secondary | ICD-10-CM

## 2019-08-19 DIAGNOSIS — J690 Pneumonitis due to inhalation of food and vomit: Secondary | ICD-10-CM | POA: Diagnosis not present

## 2019-08-19 DIAGNOSIS — Z6835 Body mass index (BMI) 35.0-35.9, adult: Secondary | ICD-10-CM

## 2019-08-19 DIAGNOSIS — R627 Adult failure to thrive: Secondary | ICD-10-CM | POA: Diagnosis not present

## 2019-08-19 DIAGNOSIS — Z66 Do not resuscitate: Secondary | ICD-10-CM | POA: Diagnosis present

## 2019-08-19 DIAGNOSIS — Z7982 Long term (current) use of aspirin: Secondary | ICD-10-CM

## 2019-08-19 DIAGNOSIS — Z9189 Other specified personal risk factors, not elsewhere classified: Secondary | ICD-10-CM | POA: Diagnosis not present

## 2019-08-19 DIAGNOSIS — R451 Restlessness and agitation: Secondary | ICD-10-CM | POA: Diagnosis not present

## 2019-08-19 DIAGNOSIS — I69354 Hemiplegia and hemiparesis following cerebral infarction affecting left non-dominant side: Secondary | ICD-10-CM | POA: Diagnosis not present

## 2019-08-19 DIAGNOSIS — J9601 Acute respiratory failure with hypoxia: Secondary | ICD-10-CM | POA: Diagnosis not present

## 2019-08-19 DIAGNOSIS — E870 Hyperosmolality and hypernatremia: Secondary | ICD-10-CM | POA: Diagnosis not present

## 2019-08-19 DIAGNOSIS — R0602 Shortness of breath: Secondary | ICD-10-CM

## 2019-08-19 DIAGNOSIS — N183 Chronic kidney disease, stage 3 unspecified: Secondary | ICD-10-CM | POA: Diagnosis present

## 2019-08-19 DIAGNOSIS — R531 Weakness: Secondary | ICD-10-CM | POA: Diagnosis not present

## 2019-08-19 DIAGNOSIS — E861 Hypovolemia: Secondary | ICD-10-CM | POA: Diagnosis not present

## 2019-08-19 DIAGNOSIS — Z951 Presence of aortocoronary bypass graft: Secondary | ICD-10-CM | POA: Diagnosis not present

## 2019-08-19 DIAGNOSIS — I69320 Aphasia following cerebral infarction: Secondary | ICD-10-CM

## 2019-08-19 DIAGNOSIS — E669 Obesity, unspecified: Secondary | ICD-10-CM | POA: Diagnosis not present

## 2019-08-19 DIAGNOSIS — I69322 Dysarthria following cerebral infarction: Secondary | ICD-10-CM

## 2019-08-19 DIAGNOSIS — K219 Gastro-esophageal reflux disease without esophagitis: Secondary | ICD-10-CM | POA: Diagnosis present

## 2019-08-19 DIAGNOSIS — Z7902 Long term (current) use of antithrombotics/antiplatelets: Secondary | ICD-10-CM | POA: Diagnosis not present

## 2019-08-19 DIAGNOSIS — I10 Essential (primary) hypertension: Secondary | ICD-10-CM

## 2019-08-19 DIAGNOSIS — G9341 Metabolic encephalopathy: Secondary | ICD-10-CM | POA: Diagnosis present

## 2019-08-19 DIAGNOSIS — I63511 Cerebral infarction due to unspecified occlusion or stenosis of right middle cerebral artery: Secondary | ICD-10-CM | POA: Diagnosis not present

## 2019-08-19 DIAGNOSIS — Z515 Encounter for palliative care: Secondary | ICD-10-CM | POA: Diagnosis not present

## 2019-08-19 DIAGNOSIS — E119 Type 2 diabetes mellitus without complications: Secondary | ICD-10-CM | POA: Diagnosis not present

## 2019-08-19 DIAGNOSIS — E1165 Type 2 diabetes mellitus with hyperglycemia: Secondary | ICD-10-CM | POA: Diagnosis present

## 2019-08-19 DIAGNOSIS — E1169 Type 2 diabetes mellitus with other specified complication: Secondary | ICD-10-CM | POA: Diagnosis not present

## 2019-08-19 DIAGNOSIS — R569 Unspecified convulsions: Secondary | ICD-10-CM

## 2019-08-19 DIAGNOSIS — Z833 Family history of diabetes mellitus: Secondary | ICD-10-CM

## 2019-08-19 DIAGNOSIS — E785 Hyperlipidemia, unspecified: Secondary | ICD-10-CM | POA: Diagnosis present

## 2019-08-19 DIAGNOSIS — Z79899 Other long term (current) drug therapy: Secondary | ICD-10-CM

## 2019-08-19 DIAGNOSIS — K449 Diaphragmatic hernia without obstruction or gangrene: Secondary | ICD-10-CM | POA: Diagnosis present

## 2019-08-19 DIAGNOSIS — Z794 Long term (current) use of insulin: Secondary | ICD-10-CM | POA: Diagnosis not present

## 2019-08-19 DIAGNOSIS — R5383 Other fatigue: Secondary | ICD-10-CM

## 2019-08-19 DIAGNOSIS — G9340 Encephalopathy, unspecified: Secondary | ICD-10-CM | POA: Diagnosis not present

## 2019-08-19 DIAGNOSIS — R0609 Other forms of dyspnea: Secondary | ICD-10-CM | POA: Diagnosis not present

## 2019-08-19 DIAGNOSIS — N179 Acute kidney failure, unspecified: Secondary | ICD-10-CM | POA: Diagnosis not present

## 2019-08-19 DIAGNOSIS — E869 Volume depletion, unspecified: Secondary | ICD-10-CM | POA: Diagnosis present

## 2019-08-19 DIAGNOSIS — Z8249 Family history of ischemic heart disease and other diseases of the circulatory system: Secondary | ICD-10-CM | POA: Diagnosis not present

## 2019-08-19 LAB — GLUCOSE, CAPILLARY
Glucose-Capillary: 128 mg/dL — ABNORMAL HIGH (ref 70–99)
Glucose-Capillary: 139 mg/dL — ABNORMAL HIGH (ref 70–99)
Glucose-Capillary: 144 mg/dL — ABNORMAL HIGH (ref 70–99)
Glucose-Capillary: 145 mg/dL — ABNORMAL HIGH (ref 70–99)
Glucose-Capillary: 152 mg/dL — ABNORMAL HIGH (ref 70–99)
Glucose-Capillary: 174 mg/dL — ABNORMAL HIGH (ref 70–99)

## 2019-08-19 MED ORDER — ACETAMINOPHEN 160 MG/5ML PO SOLN
650.0000 mg | ORAL | Status: DC | PRN
  Administered 2019-08-21 – 2019-09-08 (×7): 650 mg
  Filled 2019-08-19 (×7): qty 20.3

## 2019-08-19 MED ORDER — PRO-STAT SUGAR FREE PO LIQD
30.0000 mL | Freq: Every day | ORAL | Status: DC
  Administered 2019-08-20 – 2019-08-30 (×11): 30 mL
  Filled 2019-08-19 (×10): qty 30

## 2019-08-19 MED ORDER — ATORVASTATIN CALCIUM 40 MG PO TABS: 40.0000 mg | ORAL_TABLET | Freq: Every day | ORAL | Status: DC

## 2019-08-19 MED ORDER — DICLOFENAC SODIUM 1 % EX GEL
2.0000 g | Freq: Four times a day (QID) | CUTANEOUS | Status: DC | PRN
  Administered 2019-08-28: 2 g via TOPICAL
  Filled 2019-08-19: qty 100

## 2019-08-19 MED ORDER — METOPROLOL TARTRATE 25 MG PO TABS
25.0000 mg | ORAL_TABLET | Freq: Once | ORAL | Status: AC
  Administered 2019-08-19: 25 mg

## 2019-08-19 MED ORDER — LEVETIRACETAM 750 MG PO TABS: 750.0000 mg | ORAL_TABLET | Freq: Two times a day (BID) | ORAL | Status: DC

## 2019-08-19 MED ORDER — ASPIRIN 81 MG PO CHEW: 81.0000 mg | CHEWABLE_TABLET | Freq: Every day | ORAL | Status: DC

## 2019-08-19 MED ORDER — POLYETHYLENE GLYCOL 3350 17 G PO PACK: 17.0000 g | PACK | Freq: Every day | ORAL | Status: DC | PRN

## 2019-08-19 MED ORDER — CLOPIDOGREL BISULFATE 75 MG PO TABS
75.0000 mg | ORAL_TABLET | Freq: Every day | ORAL | Status: DC
  Administered 2019-08-20 – 2019-08-31 (×12): 75 mg
  Filled 2019-08-19 (×12): qty 1

## 2019-08-19 MED ORDER — CLOPIDOGREL BISULFATE 75 MG PO TABS: 75.0000 mg | ORAL_TABLET | Freq: Every day | ORAL | Status: DC

## 2019-08-19 MED ORDER — METOPROLOL TARTRATE 25 MG PO TABS
25.0000 mg | ORAL_TABLET | Freq: Two times a day (BID) | ORAL | Status: DC
  Administered 2019-08-19 – 2019-08-28 (×18): 25 mg
  Filled 2019-08-19 (×18): qty 1

## 2019-08-19 MED ORDER — ASPIRIN 300 MG RE SUPP
300.0000 mg | Freq: Every day | RECTAL | Status: DC
  Filled 2019-08-19 (×10): qty 1

## 2019-08-19 MED ORDER — POLYETHYLENE GLYCOL 3350 17 G PO PACK
17.0000 g | PACK | Freq: Every day | ORAL | Status: DC | PRN
  Administered 2019-08-25 – 2019-09-08 (×5): 17 g via ORAL
  Filled 2019-08-19 (×6): qty 1

## 2019-08-19 MED ORDER — ENOXAPARIN SODIUM 60 MG/0.6ML ~~LOC~~ SOLN: 0.5000 mg/kg | SUBCUTANEOUS | Status: DC

## 2019-08-19 MED ORDER — ENOXAPARIN SODIUM 60 MG/0.6ML ~~LOC~~ SOLN: 50.0000 mg | SUBCUTANEOUS | Status: DC

## 2019-08-19 MED ORDER — ACETAMINOPHEN 325 MG PO TABS
650.0000 mg | ORAL_TABLET | ORAL | Status: DC | PRN
  Administered 2019-08-28 – 2019-09-06 (×4): 650 mg via ORAL
  Filled 2019-08-19 (×4): qty 2

## 2019-08-19 MED ORDER — INSULIN ASPART 100 UNIT/ML ~~LOC~~ SOLN
0.0000 [IU] | SUBCUTANEOUS | Status: DC
  Administered 2019-08-19 (×2): 1 [IU] via SUBCUTANEOUS
  Administered 2019-08-20 (×2): 2 [IU] via SUBCUTANEOUS
  Administered 2019-08-20: 09:00:00 1 [IU] via SUBCUTANEOUS
  Administered 2019-08-21: 2 [IU] via SUBCUTANEOUS
  Administered 2019-08-21: 1 [IU] via SUBCUTANEOUS
  Administered 2019-08-21: 2 [IU] via SUBCUTANEOUS
  Administered 2019-08-21 – 2019-08-23 (×8): 1 [IU] via SUBCUTANEOUS
  Administered 2019-08-24 (×3): 2 [IU] via SUBCUTANEOUS
  Administered 2019-08-25: 17:00:00 1 [IU] via SUBCUTANEOUS
  Administered 2019-08-25: 13:00:00 2 [IU] via SUBCUTANEOUS
  Administered 2019-08-25 (×2): 1 [IU] via SUBCUTANEOUS
  Administered 2019-08-26 (×2): 2 [IU] via SUBCUTANEOUS
  Administered 2019-08-26: 12:00:00 1 [IU] via SUBCUTANEOUS
  Administered 2019-08-27 (×2): 2 [IU] via SUBCUTANEOUS
  Administered 2019-08-27: 12:00:00 1 [IU] via SUBCUTANEOUS
  Administered 2019-08-27: 17:00:00 2 [IU] via SUBCUTANEOUS
  Administered 2019-08-27: 08:00:00 1 [IU] via SUBCUTANEOUS
  Administered 2019-08-28: 2 [IU] via SUBCUTANEOUS
  Administered 2019-08-28: 21:00:00 3 [IU] via SUBCUTANEOUS
  Administered 2019-08-28: 13:00:00 1 [IU] via SUBCUTANEOUS
  Administered 2019-08-28 (×2): 3 [IU] via SUBCUTANEOUS
  Administered 2019-08-29 (×4): 2 [IU] via SUBCUTANEOUS
  Administered 2019-08-30: 1 [IU] via SUBCUTANEOUS
  Administered 2019-08-30: 3 [IU] via SUBCUTANEOUS
  Administered 2019-08-30 (×2): 1 [IU] via SUBCUTANEOUS
  Administered 2019-08-30: 3 [IU] via SUBCUTANEOUS
  Administered 2019-08-30 – 2019-08-31 (×2): 5 [IU] via SUBCUTANEOUS
  Administered 2019-08-31 (×2): 1 [IU] via SUBCUTANEOUS
  Administered 2019-09-01: 19:00:00 2 [IU] via SUBCUTANEOUS
  Administered 2019-09-01 (×2): 3 [IU] via SUBCUTANEOUS
  Administered 2019-09-01 – 2019-09-02 (×7): 2 [IU] via SUBCUTANEOUS
  Administered 2019-09-03: 21:00:00 3 [IU] via SUBCUTANEOUS
  Administered 2019-09-03: 18:00:00 1 [IU] via SUBCUTANEOUS
  Administered 2019-09-03 (×2): 2 [IU] via SUBCUTANEOUS
  Administered 2019-09-04: 1 [IU] via SUBCUTANEOUS
  Administered 2019-09-04 – 2019-09-05 (×4): 2 [IU] via SUBCUTANEOUS
  Administered 2019-09-05 (×2): 1 [IU] via SUBCUTANEOUS
  Administered 2019-09-05 – 2019-09-06 (×2): 2 [IU] via SUBCUTANEOUS
  Administered 2019-09-06 (×2): 1 [IU] via SUBCUTANEOUS
  Administered 2019-09-06 (×2): 2 [IU] via SUBCUTANEOUS
  Administered 2019-09-07: 5 [IU] via SUBCUTANEOUS
  Administered 2019-09-07: 2 [IU] via SUBCUTANEOUS
  Administered 2019-09-07: 1 [IU] via SUBCUTANEOUS
  Administered 2019-09-07: 01:00:00 2 [IU] via SUBCUTANEOUS
  Administered 2019-09-07: 3 [IU] via SUBCUTANEOUS
  Administered 2019-09-07: 1 [IU] via SUBCUTANEOUS
  Administered 2019-09-08: 3 [IU] via SUBCUTANEOUS
  Administered 2019-09-08: 5 [IU] via SUBCUTANEOUS
  Administered 2019-09-08: 3 [IU] via SUBCUTANEOUS
  Administered 2019-09-08: 12:00:00 5 [IU] via SUBCUTANEOUS

## 2019-08-19 MED ORDER — JEVITY 1.2 CAL PO LIQD: 1000.0000 mL | ORAL | Status: DC

## 2019-08-19 MED ORDER — ACETAMINOPHEN 650 MG RE SUPP
650.0000 mg | RECTAL | Status: DC | PRN
  Filled 2019-08-19: qty 1

## 2019-08-19 MED ORDER — LEVETIRACETAM 100 MG/ML PO SOLN
750.0000 mg | Freq: Two times a day (BID) | ORAL | Status: DC
  Administered 2019-08-19 – 2019-08-26 (×14): 750 mg
  Filled 2019-08-19 (×18): qty 10

## 2019-08-19 MED ORDER — ATORVASTATIN CALCIUM 40 MG PO TABS
40.0000 mg | ORAL_TABLET | Freq: Every day | ORAL | Status: DC
  Administered 2019-08-20 – 2019-09-09 (×20): 40 mg
  Filled 2019-08-19 (×20): qty 1

## 2019-08-19 MED ORDER — PRO-STAT SUGAR FREE PO LIQD: 30.0000 mL | Freq: Every day | ORAL | Status: DC

## 2019-08-19 MED ORDER — ASPIRIN 300 MG RE SUPP: 300.0000 mg | Freq: Every day | RECTAL | Status: DC

## 2019-08-19 MED ORDER — ASPIRIN 81 MG PO CHEW
81.0000 mg | CHEWABLE_TABLET | Freq: Every day | ORAL | Status: DC
  Administered 2019-08-20 – 2019-09-09 (×20): 81 mg
  Filled 2019-08-19 (×20): qty 1

## 2019-08-19 MED ORDER — DICLOFENAC SODIUM 1 % TD GEL: 2.0000 g | Freq: Four times a day (QID) | TRANSDERMAL | Status: DC | PRN

## 2019-08-19 MED ORDER — METOPROLOL TARTRATE 50 MG PO TABS: 50.0000 mg | ORAL_TABLET | Freq: Two times a day (BID) | ORAL | Status: DC

## 2019-08-19 MED ORDER — TAMSULOSIN HCL 0.4 MG PO CAPS: 0.4000 mg | ORAL_CAPSULE | Freq: Every day | ORAL | Status: DC

## 2019-08-19 MED ORDER — JEVITY 1.2 CAL PO LIQD
1000.0000 mL | ORAL | Status: DC
  Administered 2019-08-19 – 2019-08-25 (×5): 1000 mL
  Filled 2019-08-19 (×10): qty 1000

## 2019-08-19 NOTE — Progress Notes (Signed)
Inpatient Rehabilitation Admissions Coordinator  Insurance has approved CIR and bed is available to admit pt today. I contacted pt's daughter, [name], by phone and she is in agreement. I have alerted Dr. Starla Link, RN CM and SW. I will make the arrangements to admit today.  Danne Baxter, RN, MSN Rehab Admissions Coordinator [phone] 08/19/2019 10:13 AM

## 2019-08-19 NOTE — Progress Notes (Signed)
Occupational Therapy Treatment Patient Details Name: Andres [name] MRN: [id number] DOB: 10-13-1940 Today's Date: 08/19/2019    History of present illness 79 yo male from home found laying on floor with incomprehensiable speech, L side weakness and not follow commands. Pt noted to be incontinent of urine.  R gaze preference and L side hemiparesis  CT (+) Hyperdense right middle cerebral artery distal M1 and proximal M2segments.PMH CAD with CABG 1997 DM HTn diverticulosis hiatal hernia head mass hepatitis lowe backpain mitral valve prolapse obese CVA hx smoking 29 years   OT comments  Pt. Seen for skilled ot treatment session.  Focus of session was continued efforts with transfers and bed mobility.  Pt. Required 2-3 person assist with use of stedy for transfer back to bed.  Max encouragement for pt. To engage in transfer with hand over hand assistance for hand placement on stedy.  Pt. Will benefit from continued intense level therapies.    Follow Up Recommendations  Supervision/Assistance - 24 hour;SNF    Equipment Recommendations       Recommendations for Other Services PT consult    Precautions / Restrictions Precautions Precautions: Fall Restrictions Weight Bearing Restrictions: No       Mobility Bed Mobility Overal bed mobility: Needs Assistance Bed Mobility: Sit to Supine Rolling: (Pt in R sidelying.) Sidelying to sit: Total assist   Sit to supine: Max assist;+2 for physical assistance   General bed mobility comments: eob after stedy transfer to supine max a x2 for trunk and and b les assistance into bed. max a x2 for scooting up in bed using bed controls to assist  Transfers Overall transfer level: Needs assistance Equipment used: Ambulation equipment used Transfers: Sit to/from Stand Sit to Stand: +2 physical assistance;+2 safety/equipment(3 person assist to manage pt. in/out of stedy)         General transfer comment: required 3 person assist back to bed with  notable fatigue as a contributing factor.  heavy reliance on L side. difficulty keeping pt. in partial standing to lift up the seated portions of stedy to assist with transfer back to bed    Balance Overall balance assessment: Needs assistance   Sitting balance-Leahy Scale: Poor       Standing balance-Leahy Scale: Poor                             ADL either performed or assessed with clinical judgement   ADL                                               Vision       Perception     Praxis      Cognition Arousal/Alertness: Lethargic Behavior During Therapy: Flat affect Overall Cognitive Status: Impaired/Different from baseline Area of Impairment: Orientation;Attention;Memory;Following commands;Safety/judgement;Awareness;Problem solving                 Orientation Level: Disoriented to;Place;Time;Situation Current Attention Level: Focused Memory: Decreased short-term memory Following Commands: Follows one step commands inconsistently;Follows one step commands with increased time Safety/Judgement: Decreased awareness of safety;Decreased awareness of deficits Awareness: Intellectual Problem Solving: Slow processing;Difficulty sequencing;Requires verbal cues;Decreased initiation;Requires tactile cues General Comments: Pt performed mobility with increased effort.  He has moments where he looses focus and requires increased time to rengage in activity.  Exercises     Shoulder Instructions       General Comments      Pertinent Vitals/ Pain       Pain Assessment: Faces Faces Pain Scale: No hurt Pain Location: LUE Pain Descriptors / Indicators: Discomfort Pain Intervention(s): Monitored during session;Repositioned  Home Living                                          Prior Functioning/Environment              Frequency  Min 2X/week        Progress Toward Goals  OT Goals(current goals can now  be found in the care plan section)  Progress towards OT goals: Progressing toward goals  Acute Rehab OT Goals Patient Stated Goal: Unstated  Plan      Co-evaluation                 AM-PAC OT "6 Clicks" Daily Activity     Outcome Measure   Help from another person eating meals?: Total Help from another person taking care of personal grooming?: A Lot Help from another person toileting, which includes using toliet, bedpan, or urinal?: A Lot Help from another person bathing (including washing, rinsing, drying)?: A Lot Help from another person to put on and taking off regular upper body clothing?: A Lot Help from another person to put on and taking off regular lower body clothing?: A Lot 6 Click Score: 11    End of Session    OT Visit Diagnosis: Unsteadiness on feet (R26.81);Other abnormalities of gait and mobility (R26.89);Muscle weakness (generalized) (M62.81);Other symptoms and signs involving cognitive function   Activity Tolerance Patient limited by lethargy   Patient Left in bed;with call bell/phone within reach;with nursing/sitter in room   Nurse Communication          Time: [id number] OT Time Calculation (min): 10 min  Charges: OT General Charges $OT Visit: 1 Visit OT Treatments $Therapeutic Activity: 8-22 mins  Sonia Baller, COTA/L Acute Rehabilitation [phone]   Janice Coffin 08/19/2019, 1:03 PM

## 2019-08-19 NOTE — Discharge Summary (Signed)
Physician Discharge Summary  KHAMBREL [name] [id number] DOB: 26-Aug-1940 DOA: 08/09/2019  PCP: [name], MD  Admit date: 08/09/2019 Discharge date: 08/19/2019  Admitted From: Home Disposition: CIR  Recommendations for Outpatient Follow-up:  1. Follow up with CIR provider at earliest convenience 2. Outpatient follow-up with neurology 3. Consider palliative care evaluation if condition does not improve 4. Follow up in ED if symptoms worsen or new appear   Home Health: No Equipment/Devices: NG tube  Discharge Condition: Guarded to poor CODE STATUS: Full Diet recommendation: Tube feeding as per dietary recommendations  Brief/Interim Summary: 79 year old male with history of hypertension, hyperlipidemia, CAD status post CABG, carotid artery stenosis, unspecified CVA, mitral valve prolapse, diabetes mellitus type 2, BPH, hepatitis, GERD presented after being found down with associated left-sided weakness and dysarthria.  He was found to have acute right MCA stroke.  Neurology was consulted.  He was also found to have staph capitis bacteremia for which ID was consulted.  ID recommended to continue Ancef till 08/19/2019.  Patient is currently getting tube feeding via NG tube.  He will be discharged to CIR once bed is available.  Discharge Diagnoses:   Acute right MCA stroke -Presented with left sided weakness and dysarthria.  MRI significant for large area of acute right MCA territory infarct -LDL 100.  A1c 6.2 -Transthoracic echo showed no evidence of embolic source -PT is recommending SNF/CIR placement.   -SLP following.  Currently n.p.o.  Continue feeding via cortrak.  If swallowing does not improve in the near future, patient might need PEG tube feeding. -Neurology has signed off: Continue dual antiplatelet therapy for 3 months followed by Plavix alone.  Outpatient follow-up with neurology. -Discharge to CIR once bed is available  Acute metabolic  encephalopathy/delirium/lethargy -Mental status has slightly improved after discontinuation of Ativan.  Monitor.  Staph capitis bacteremia -Currently on Ancef.  Repeat cultures are negative so far.  ID has signed off and recommend Ancef till today, 08/19/2019  History of seizures -EEG showed no evidence of seizures.  Patient was having left-sided tremors and there was a concern for subclinical seizures.   -CT of the head showed progression of right MCA territory infarct  -neurology following.  Continue Keppra 750 mg twice daily.  Use Ativan only for generalized tonic-clonic seizure lasting more than 2 minutes or focal seizure lasting more than 5 minutes as per neurology -Outpatient follow-up with neurology.  Diabetes mellitus type 2 -A1c 6.2.  Not on any medications as outpatient  Essential hypertension -Blood pressure stable.  Continue oral metoprolol.  Losartan and hydrochlorothiazide on hold.  Might need to restart if blood pressure still elevated.  CAD with history of CABG in 1997 -Continue aspirin, Plavix, Lipitor.  Outpatient follow-up with cardiology  Chronic kidney disease stage III -Stable  BPH -Continue Flomax  Hyperlipidemia -Continue Lipitor  Obesity -Outpatient follow-up  Discharge Instructions  Discharge Instructions    Ambulatory referral to Neurology   Complete by: As directed    An appointment is requested in approximately: in 2 to 3 weeks for stroke follow-up   Increase activity slowly   Complete by: As directed      Allergies as of 08/19/2019   No Known Allergies     Medication List    STOP taking these medications   Acetaminophen 500 MG coapsule   Adult Aspirin Low Strength 81 MG EC tablet Generic drug: Aspirin Replaced by: aspirin 81 MG chewable tablet   folic acid 1 MG tablet Commonly known as: [employer]  hydrochlorothiazide 12.5 MG capsule Commonly known as: Microzide   losartan 50 MG tablet Commonly known as: COZAAR    meloxicam 7.5 MG tablet Commonly known as: MOBIC   multivitamin capsule   OMEGA 3 PO   VITAMIN B-COMPLEX PO   vitamin C 1000 MG tablet   VITAMIN D PO     TAKE these medications   aspirin 81 MG chewable tablet Place 1 tablet (81 mg total) into feeding tube daily. Replaces: Adult Aspirin Low Strength 81 MG EC tablet   atorvastatin 40 MG tablet Commonly known as: LIPITOR Place 1 tablet (40 mg total) into feeding tube daily. What changed: how to take this   clopidogrel 75 MG tablet Commonly known as: PLAVIX Place 1 tablet (75 mg total) into feeding tube daily. What changed: how to take this   diclofenac sodium 1 % Gel Commonly known as: VOLTAREN Apply 2 g topically every 6 (six) hours as needed (for arthritis).   feeding supplement (JEVITY 1.2 CAL) Liqd Place 1,000 mLs into feeding tube continuous.   feeding supplement (PRO-STAT SUGAR FREE 64) Liqd Place 30 mLs into feeding tube daily.   levETIRAcetam 750 MG tablet Commonly known as: Keppra Take 1 tablet (750 mg total) by mouth 2 (two) times daily. Per tube What changed:   medication strength  how much to take  additional instructions   metoprolol tartrate 50 MG tablet Commonly known as: LOPRESSOR Place 1 tablet (50 mg total) into feeding tube 2 (two) times daily. What changed: how to take this   polyethylene glycol 17 g packet Commonly known as: MIRALAX / GLYCOLAX Place 17 g into feeding tube daily as needed for moderate constipation.   tamsulosin 0.4 MG Caps capsule Commonly known as: FLOMAX Take 1 capsule (0.4 mg total) by mouth daily. Per tube What changed: additional instructions      Follow-up Information    Star Age, MD Follow up in 4 week(s).   Specialties: Neurology, Radiology Contact information: [address] [place] [address] [phone]          No Known Allergies  Consultations:  Neurology/ID   Procedures/Studies: CT Code Stroke CTA Head W/WO  contrast  Result Date: 08/09/2019 CLINICAL DATA:  Left-sided weakness with slurred speech EXAM: CT ANGIOGRAPHY HEAD AND NECK CT PERFUSION BRAIN TECHNIQUE: Multidetector CT imaging of the head and neck was performed using the standard protocol during bolus administration of intravenous contrast. Multiplanar CT image reconstructions and MIPs were obtained to evaluate the vascular anatomy. Carotid stenosis measurements (when applicable) are obtained utilizing NASCET criteria, using the distal internal carotid diameter as the denominator. Multiphase CT imaging of the brain was performed following IV bolus contrast injection. Subsequent parametric perfusion maps were calculated using RAPID software. CONTRAST:  174mL OMNIPAQUE IOHEXOL 350 MG/ML SOLN COMPARISON:  None. FINDINGS: CTA NECK FINDINGS SKELETON: Multilevel degenerative disc disease and facet hypertrophy without bony spinal canal stenosis. OTHER NECK: Right posterior neck lipoma near the level of the occiput put. Normal thyroid. No pharyngeal or laryngeal mass. UPPER CHEST: No pneumothorax or pleural effusion. No nodules or masses. AORTIC ARCH: There is mild calcific atherosclerosis of the aortic arch. There is no aneurysm, dissection or hemodynamically significant stenosis of the visualized portion of the aorta. Conventional 3 vessel aortic branching pattern. The visualized proximal subclavian arteries are widely patent. RIGHT CAROTID SYSTEM: No dissection, occlusion or aneurysm. Mild atherosclerotic calcification at the carotid bifurcation without hemodynamically significant stenosis. LEFT CAROTID SYSTEM: No dissection, occlusion or aneurysm. Mild atherosclerotic calcification  at the carotid bifurcation without hemodynamically significant stenosis. VERTEBRAL ARTERIES: Right dominant configuration. Both origins are clearly patent. There is no dissection, occlusion or flow-limiting stenosis to the skull base (V1-V3 segments). CTA HEAD FINDINGS POSTERIOR  CIRCULATION: --Vertebral arteries: Normal V4 segments. --Posterior inferior cerebellar arteries (PICA): Patent origins from the vertebral arteries. --Anterior inferior cerebellar arteries (AICA): Patent origins from the basilar artery. --Basilar artery: Normal. --Superior cerebellar arteries: Normal. --Posterior cerebral arteries: Normal. Both are predominantly supplied by the posterior communicating arteries (p-comm). ANTERIOR CIRCULATION: --Intracranial internal carotid arteries: Atherosclerotic calcification of the internal carotid arteries at the skull base without hemodynamically significant stenosis. --Anterior cerebral arteries (ACA): Normal. Both A1 segments are present. Patent anterior communicating artery (a-comm). --Middle cerebral arteries (MCA): There is a short segment occlusion at the right M1-2 junction. There is generalized attenuated vascularity throughout the right MCA distribution, predominantly within the inferior distribution corresponding to the old infarct. The left MCA is normal. VENOUS SINUSES: As permitted by contrast timing, patent. ANATOMIC VARIANTS: None Review of the MIP images confirms the above findings. CT Brain Perfusion Findings: ASPECTS: 10 CBF (<30%) Volume: 77mL Perfusion (Tmax>6.0s) volume: 138mL Mismatch Volume: 56mL Infarction Location:Old infarct in the right MCA territory. The reported data is unreliable and predominantly corresponds to the area of old infarct brain. IMPRESSION: 1. Short segment occlusion of the right middle cerebral artery at the M1 M2 junction. Generalized attenuation of the vasculature throughout the right MCA territory is likely chronic in the setting of remote posterior MCA territory infarct. 2. Calculated regions of decreased perfusion are likely unreliable and predominantly correspond to the old infarct site. There may be a small component of anterior extension. 3. Carotid and aortic atherosclerosis without hemodynamically significant stenosis by  NASCET criteria. Patent vertebral arteries. These results were called by telephone at the time of interpretation on 08/09/2019 at 2:30 am to provider [facility] , who verbally acknowledged these results. Electronically Signed   By: Ulyses Jarred M.D.   On: 08/09/2019 02:30   DG Chest 2 View  Result Date: 08/09/2019 CLINICAL DATA:  Stroke workup EXAM: CHEST - 2 VIEW COMPARISON:  09/05/2018 FINDINGS: Interstitial coarsening at the bases similar to prior. This is accentuated from prior in the setting of low lung volumes. Prior CABG with stable heart size. No evidence of effusion or pneumothorax. IMPRESSION: Low volume chest with mild atelectatic type opacity at the bases. Electronically Signed   By: Monte Fantasia M.D.   On: 08/09/2019 08:45   CT HEAD WO CONTRAST  Result Date: 08/14/2019 CLINICAL DATA:  Stroke follow-up. Worsening stroke symptoms. EXAM: CT HEAD WITHOUT CONTRAST TECHNIQUE: Contiguous axial images were obtained from the base of the skull through the vertex without intravenous contrast. COMPARISON:  08/09/2019 FINDINGS: Brain: Slight expansion of right MCA territory infarct, now involving more of the insula and frontal operculum. There is white matter hypoattenuation consistent with chronic ischemic microangiopathy. No hemorrhage. No herniation or other significant mass effect. No hydrocephalus. Vascular: Atherosclerotic calcification of the internal carotid arteries at the skull base. No abnormal hyperdensity of the major intracranial arteries or dural venous sinuses. Skull: The visualized skull base, calvarium and extracranial soft tissues are normal. Sinuses/Orbits: No fluid levels or advanced mucosal thickening of the visualized paranasal sinuses. No mastoid or middle ear effusion. The orbits are normal. Other: None. IMPRESSION: Progression of right MCA territory infarct, extending further into the insula and frontal operculum without hemorrhage or herniation. Electronically Signed    By: Ulyses Jarred M.D.   On:  08/14/2019 02:16   CT Code Stroke CTA Neck W/WO contrast  Result Date: 08/09/2019 CLINICAL DATA:  Left-sided weakness with slurred speech EXAM: CT ANGIOGRAPHY HEAD AND NECK CT PERFUSION BRAIN TECHNIQUE: Multidetector CT imaging of the head and neck was performed using the standard protocol during bolus administration of intravenous contrast. Multiplanar CT image reconstructions and MIPs were obtained to evaluate the vascular anatomy. Carotid stenosis measurements (when applicable) are obtained utilizing NASCET criteria, using the distal internal carotid diameter as the denominator. Multiphase CT imaging of the brain was performed following IV bolus contrast injection. Subsequent parametric perfusion maps were calculated using RAPID software. CONTRAST:  173mL OMNIPAQUE IOHEXOL 350 MG/ML SOLN COMPARISON:  None. FINDINGS: CTA NECK FINDINGS SKELETON: Multilevel degenerative disc disease and facet hypertrophy without bony spinal canal stenosis. OTHER NECK: Right posterior neck lipoma near the level of the occiput put. Normal thyroid. No pharyngeal or laryngeal mass. UPPER CHEST: No pneumothorax or pleural effusion. No nodules or masses. AORTIC ARCH: There is mild calcific atherosclerosis of the aortic arch. There is no aneurysm, dissection or hemodynamically significant stenosis of the visualized portion of the aorta. Conventional 3 vessel aortic branching pattern. The visualized proximal subclavian arteries are widely patent. RIGHT CAROTID SYSTEM: No dissection, occlusion or aneurysm. Mild atherosclerotic calcification at the carotid bifurcation without hemodynamically significant stenosis. LEFT CAROTID SYSTEM: No dissection, occlusion or aneurysm. Mild atherosclerotic calcification at the carotid bifurcation without hemodynamically significant stenosis. VERTEBRAL ARTERIES: Right dominant configuration. Both origins are clearly patent. There is no dissection, occlusion or flow-limiting  stenosis to the skull base (V1-V3 segments). CTA HEAD FINDINGS POSTERIOR CIRCULATION: --Vertebral arteries: Normal V4 segments. --Posterior inferior cerebellar arteries (PICA): Patent origins from the vertebral arteries. --Anterior inferior cerebellar arteries (AICA): Patent origins from the basilar artery. --Basilar artery: Normal. --Superior cerebellar arteries: Normal. --Posterior cerebral arteries: Normal. Both are predominantly supplied by the posterior communicating arteries (p-comm). ANTERIOR CIRCULATION: --Intracranial internal carotid arteries: Atherosclerotic calcification of the internal carotid arteries at the skull base without hemodynamically significant stenosis. --Anterior cerebral arteries (ACA): Normal. Both A1 segments are present. Patent anterior communicating artery (a-comm). --Middle cerebral arteries (MCA): There is a short segment occlusion at the right M1-2 junction. There is generalized attenuated vascularity throughout the right MCA distribution, predominantly within the inferior distribution corresponding to the old infarct. The left MCA is normal. VENOUS SINUSES: As permitted by contrast timing, patent. ANATOMIC VARIANTS: None Review of the MIP images confirms the above findings. CT Brain Perfusion Findings: ASPECTS: 10 CBF (<30%) Volume: 26mL Perfusion (Tmax>6.0s) volume: 173mL Mismatch Volume: 44mL Infarction Location:Old infarct in the right MCA territory. The reported data is unreliable and predominantly corresponds to the area of old infarct brain. IMPRESSION: 1. Short segment occlusion of the right middle cerebral artery at the M1 M2 junction. Generalized attenuation of the vasculature throughout the right MCA territory is likely chronic in the setting of remote posterior MCA territory infarct. 2. Calculated regions of decreased perfusion are likely unreliable and predominantly correspond to the old infarct site. There may be a small component of anterior extension. 3. Carotid and  aortic atherosclerosis without hemodynamically significant stenosis by NASCET criteria. Patent vertebral arteries. These results were called by telephone at the time of interpretation on 08/09/2019 at 2:30 am to provider [facility] , who verbally acknowledged these results. Electronically Signed   By: Ulyses Jarred M.D.   On: 08/09/2019 02:30   MR BRAIN WO CONTRAST  Result Date: 08/09/2019 CLINICAL DATA:  Stroke EXAM: MRI HEAD WITHOUT CONTRAST TECHNIQUE: Multiplanar,  multiecho pulse sequences of the brain and surrounding structures were obtained without intravenous contrast. COMPARISON:  MRI head 09/06/2018. CT perfusion and CTA head 08/09/2019 FINDINGS: Brain: Chronic infarct right parietal lobe. Large area of acute infarct surrounding the chronic infarct. Acute infarct in the right posterior temporal lobe extending into the right frontal and parietal lobe. Acute infarct right insula. Sparing of the basal ganglia. Acute infarct is primarily cortical in location with minimal white matter component. Mild hemorrhage within the chronic infarct as noted previously. Ventricle size normal. No midline shift. Chronic microvascular ischemic changes throughout the white matter. Vascular: Normal arterial flow voids at the base of brain. Skull and upper cervical spine: Negative. Lipoma in the right posterior occipital scalp. Sinuses/Orbits: Mucosal edema paranasal sinuses with air-fluid level left maxillary sinus. Left cataract surgery. Other: Motion degraded study. IMPRESSION: Large area of acute infarct right MCA territory surrounding the chronic infarct in the right parietal lobe. Moderate chronic microvascular ischemic changes. Motion degraded study. Electronically Signed   By: Franchot Gallo M.D.   On: 08/09/2019 14:12   CT Code Stroke Cerebral Perfusion with contrast  Result Date: 08/09/2019 CLINICAL DATA:  Left-sided weakness with slurred speech EXAM: CT ANGIOGRAPHY HEAD AND NECK CT PERFUSION BRAIN  TECHNIQUE: Multidetector CT imaging of the head and neck was performed using the standard protocol during bolus administration of intravenous contrast. Multiplanar CT image reconstructions and MIPs were obtained to evaluate the vascular anatomy. Carotid stenosis measurements (when applicable) are obtained utilizing NASCET criteria, using the distal internal carotid diameter as the denominator. Multiphase CT imaging of the brain was performed following IV bolus contrast injection. Subsequent parametric perfusion maps were calculated using RAPID software. CONTRAST:  150mL OMNIPAQUE IOHEXOL 350 MG/ML SOLN COMPARISON:  None. FINDINGS: CTA NECK FINDINGS SKELETON: Multilevel degenerative disc disease and facet hypertrophy without bony spinal canal stenosis. OTHER NECK: Right posterior neck lipoma near the level of the occiput put. Normal thyroid. No pharyngeal or laryngeal mass. UPPER CHEST: No pneumothorax or pleural effusion. No nodules or masses. AORTIC ARCH: There is mild calcific atherosclerosis of the aortic arch. There is no aneurysm, dissection or hemodynamically significant stenosis of the visualized portion of the aorta. Conventional 3 vessel aortic branching pattern. The visualized proximal subclavian arteries are widely patent. RIGHT CAROTID SYSTEM: No dissection, occlusion or aneurysm. Mild atherosclerotic calcification at the carotid bifurcation without hemodynamically significant stenosis. LEFT CAROTID SYSTEM: No dissection, occlusion or aneurysm. Mild atherosclerotic calcification at the carotid bifurcation without hemodynamically significant stenosis. VERTEBRAL ARTERIES: Right dominant configuration. Both origins are clearly patent. There is no dissection, occlusion or flow-limiting stenosis to the skull base (V1-V3 segments). CTA HEAD FINDINGS POSTERIOR CIRCULATION: --Vertebral arteries: Normal V4 segments. --Posterior inferior cerebellar arteries (PICA): Patent origins from the vertebral arteries.  --Anterior inferior cerebellar arteries (AICA): Patent origins from the basilar artery. --Basilar artery: Normal. --Superior cerebellar arteries: Normal. --Posterior cerebral arteries: Normal. Both are predominantly supplied by the posterior communicating arteries (p-comm). ANTERIOR CIRCULATION: --Intracranial internal carotid arteries: Atherosclerotic calcification of the internal carotid arteries at the skull base without hemodynamically significant stenosis. --Anterior cerebral arteries (ACA): Normal. Both A1 segments are present. Patent anterior communicating artery (a-comm). --Middle cerebral arteries (MCA): There is a short segment occlusion at the right M1-2 junction. There is generalized attenuated vascularity throughout the right MCA distribution, predominantly within the inferior distribution corresponding to the old infarct. The left MCA is normal. VENOUS SINUSES: As permitted by contrast timing, patent. ANATOMIC VARIANTS: None Review of the MIP images confirms the above findings.  CT Brain Perfusion Findings: ASPECTS: 10 CBF (<30%) Volume: 41mL Perfusion (Tmax>6.0s) volume: 118mL Mismatch Volume: 52mL Infarction Location:Old infarct in the right MCA territory. The reported data is unreliable and predominantly corresponds to the area of old infarct brain. IMPRESSION: 1. Short segment occlusion of the right middle cerebral artery at the M1 M2 junction. Generalized attenuation of the vasculature throughout the right MCA territory is likely chronic in the setting of remote posterior MCA territory infarct. 2. Calculated regions of decreased perfusion are likely unreliable and predominantly correspond to the old infarct site. There may be a small component of anterior extension. 3. Carotid and aortic atherosclerosis without hemodynamically significant stenosis by NASCET criteria. Patent vertebral arteries. These results were called by telephone at the time of interpretation on 08/09/2019 at 2:30 am to provider  [facility] , who verbally acknowledged these results. Electronically Signed   By: Ulyses Jarred M.D.   On: 08/09/2019 02:30   DG CHEST PORT 1 VIEW  Result Date: 08/14/2019 CLINICAL DATA:  Fever. Shortness of breath. EXAM: PORTABLE CHEST 1 VIEW COMPARISON:  08/09/2019 and 09/05/2018 FINDINGS: Feeding tube tip is in the region of the duodenal bulb. Heart size and pulmonary vascularity are normal. Minimal residual atelectasis at the left base medially. The lungs are otherwise clear. CABG. No bone abnormality. IMPRESSION: 1. Minimal residual atelectasis at the left lung base. Lung aeration has improved bilaterally. 2. Feeding tube tip is in the duodenal bulb. Electronically Signed   By: Lorriane Shire M.D.   On: 08/14/2019 10:06   DG Abd Portable 1V  Result Date: 08/15/2019 CLINICAL DATA:  Enteric tube placement EXAM: PORTABLE ABDOMEN - 1 VIEW COMPARISON:  08/09/2019 FINDINGS: Feeding tube enters the stomach and turned to the right. Tip appears to be in the region of the duodenal bulb. Normal bowel gas pattern. IMPRESSION: Feeding tube tip in the region of the duodenal bulb. Electronically Signed   By: Franchot Gallo M.D.   On: 08/15/2019 15:33   DG Abd Portable 1V  Result Date: 08/09/2019 CLINICAL DATA:  Incontinence EXAM: PORTABLE ABDOMEN - 1 VIEW COMPARISON:  July 26, 2013 FINDINGS: The exam is nearly nondiagnostic secondary to underpenetration. The visualized bowel gas pattern is nonobstructive and nonspecific. There is no definite acute osseous abnormality on this study. Vascular calcifications are noted. IMPRESSION: 1. Suboptimal exam as detailed above. 2. No definite acute abnormality. Electronically Signed   By: Constance Holster M.D.   On: 08/09/2019 16:11   EEG adult  Result Date: 08/14/2019 Lora Havens, MD     08/14/2019 11:45 AM Patient Name: Andres [name] MRN: [id number] Epilepsy Attending: Lora Havens Referring Physician/Provider: Dr Roland Rack Date:  08/13/2018 Duration: 25.56 mins Patient history: 76 M with history of seziure who presented with stroke with and frequent left-sided twitching concerning for seizure. EEG to evaluate for seizure. Level of alertness: asleep/sedated AEDs during EEG study: LEV Technical aspects: This EEG study was done with scalp electrodes positioned according to the 10-20 International system of electrode placement. Electrical activity was acquired at a sampling rate of 500Hz  and reviewed with a high frequency filter of 70Hz  and a low frequency filter of 1Hz . EEG data were recorded continuously and digitally stored. DESCRIPTION: EEG showed continuous generalized and lateralized right hemisphere low amplitude 3-5hz  theta-delta slowing. Hyperventilation and photic stimulation were not performed. ABNORMALITY - Continuous slow, generalized and lateralized right hemisphere IMPRESSION: This study is suggestive of cortical dysfunction in right hemisphere likely secondary to underlying infarct as well as  severe diffuse encephalopathy, non specific to etiology. No seizures or epileptiform discharges were seen throughout the recording. Lora Havens   EEG adult  Result Date: 08/09/2019 Lora Havens, MD     08/09/2019  7:27 PM Patient Name: Andres [name] MRN: [id number] Epilepsy Attending: Lora Havens Referring Physician/Provider: Dr Antony Contras Date: 08/08/2018 Duration: 22.13 mins Patient history: 79yo M with prior stroke and seizures who presented with left sided weakness. EEG to evaluate for seizure Level of alertness: awake AEDs during EEG study: [place] Technical aspects: This EEG study was done with scalp electrodes positioned according to the 10-20 International system of electrode placement. Electrical activity was acquired at a sampling rate of 500Hz  and reviewed with a high frequency filter of 70Hz  and a low frequency filter of 1Hz . EEG data were recorded continuously and digitally stored. DESCRIPTION: No clear posterior  dominant rhythm was seen. EEG showed continuous generalized and lateralized right hemisphere 3-6hz  theta-delta slowing. Hyperventilation and photic stimulation were not performed. ABNORMALITY - Continuous slow, generalized and lateralized right hemisphere IMPRESSION: This study is suggestive of cortical dysfunction in right hemisphere consistent with underlying infarct. Additionally, there is evidence of moderate diffuse encephalopathy. No seizures or epileptiform discharges were seen throughout the recording. Priyanka Barbra Sarks   Overnight EEG with video  Result Date: 08/15/2019 Lora Havens, MD     08/15/2019  1:51 PM Patient Name: Andres [name] MRN: [id number] Epilepsy Attending: Lora Havens Referring Physician/Provider: Dr Karena Addison rule Duration: 08/13/2018 1128 to 08/15/2019 1250  Patient history: 56 M with history of seziure who presented with stroke with and frequent left-sided twitching concerning for seizure. EEG to evaluate for seizure.  Level of alertness:  Awake, lethragic  AEDs during EEG study: LEV  Technical aspects: This EEG study was done with scalp electrodes positioned according to the 10-20 International system of electrode placement. Electrical activity was acquired at a sampling rate of 500Hz  and reviewed with a high frequency filter of 70Hz  and a low frequency filter of 1Hz . EEG data were recorded continuously and digitally stored.  DESCRIPTION:  During awake state, no clear posterior dominant rhythm is seen. EEG showed continuous generalized and lateralized right hemisphere low amplitude 3-5hz  theta-delta slowing. Hyperventilation and photic stimulation were not performed.  ABNORMALITY - Continuous slow, generalized and lateralized right hemisphere  IMPRESSION: This study is suggestive of cortical dysfunction in right hemisphere likely secondary to underlying infarct as well as severe diffuse encephalopathy, non specific to etiology. No seizures or epileptiform discharges  were seen throughout the recording.  Lora Havens   ECHOCARDIOGRAM COMPLETE  Result Date: 08/09/2019   ECHOCARDIOGRAM REPORT   Patient Name:   Andres [name] Date of Exam: 08/09/2019 Medical Rec #:  [id number]       Height:       65.0 in Accession #:    [id number]      Weight:       245.1 lb Date of Birth:  02/08/1941       BSA:          2.16 m Patient Age:    52 years        BP:           185/95 mmHg Patient Gender: M               HR:           86 bpm. Exam Location:  Inpatient Procedure: 2D Echo, Cardiac Doppler and Color Doppler Indications:  Stroke 434.91  History:        Patient has prior history of Echocardiogram examinations, most                 recent 09/06/2018. CAD, Stroke, Mitral Valve Prolapse,                 Signs/Symptoms:Dyspnea and Syncope; Risk Factors:Hypertension,                 Diabetes, Dyslipidemia and Former Smoker. Elevated troponin.  Sonographer:    Paulita Fujita RDCS Referring Phys: [id number] [facility] A SMITH  Sonographer Comments: Suboptimal subcostal window. Image acquisition challenging due to respiratory motion. IMPRESSIONS  1. Left ventricular ejection fraction, by visual estimation, is 60%. The left ventricle has normal function. There is no left ventricular hypertrophy.  2. Abnormal septal motion secondary to conduction delay.  3. Elevated left ventricular end-diastolic pressure.  4. Left ventricular diastolic parameters are consistent with Grade II diastolic dysfunction (pseudonormalization).  5. Global right ventricle has normal systolic function.The right ventricular size is normal. No increase in right ventricular wall thickness.  6. Left atrial size was normal.  7. Right atrial size was normal.  8. The mitral valve is abnormal. Trivial mitral valve regurgitation. No evidence of mitral stenosis.  9. Mild bileaflet mitral valve prolapse. 10. The tricuspid valve is normal in structure. 11. The aortic valve is normal in structure. Aortic valve regurgitation is not  visualized. No evidence of aortic valve sclerosis or stenosis. 12. The pulmonic valve was normal in structure. Pulmonic valve regurgitation is trivial. 13. The inferior vena cava is normal in size with greater than 50% respiratory variability, suggesting right atrial pressure of 3 mmHg. 14. The interatrial septum was not well visualized. 15. No intracardiac source of emboli noted. FINDINGS  Left Ventricle: Left ventricular ejection fraction, by visual estimation, is 60%. The left ventricle has normal function. The left ventricle has no regional wall motion abnormalities. There is no left ventricular hypertrophy. Abnormal (paradoxical) septal motion, consistent with left bundle branch block. Left ventricular diastolic parameters are consistent with Grade II diastolic dysfunction (pseudonormalization). Elevated left ventricular end-diastolic pressure. 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 Right Ventricle: The right ventricular size is normal. No increase in right ventricular wall thickness. Global RV systolic function is has normal systolic function. Left Atrium: Left atrial size was normal in size. Right Atrium: Right atrial size was normal in size Pericardium: There is no evidence of pericardial effusion. Mitral Valve: The mitral valve is abnormal. There is mild prolapse of of the mitral valve. Trivial mitral valve regurgitation. No evidence of mitral valve stenosis by observation. Tricuspid Valve: The tricuspid valve is normal in structure. Tricuspid valve regurgitation is not demonstrated. Aortic Valve: The aortic valve is normal in structure. Aortic valve regurgitation is not visualized. The aortic valve is structurally normal, with no evidence of sclerosis or stenosis. Pulmonic Valve: The pulmonic valve was normal in structure. Pulmonic valve regurgitation is trivial. Pulmonic regurgitation is trivial. Aorta: The aortic root, ascending aorta and aortic arch are all structurally normal, with no evidence of dilitation  or obstruction. Venous: The inferior vena cava is normal in size with greater than 50% respiratory variability, suggesting right atrial pressure of 3 mmHg. IAS/Shunts: The interatrial septum was not well visualized.  LEFT VENTRICLE PLAX 2D LVIDd:         5.40 cm       Diastology LVIDs:         3.90 cm  LV e' lateral:   8.81 cm/s LV PW:         0.80 cm       LV E/e' lateral: 10.3 LV IVS:        0.80 cm       LV e' medial:    5.66 cm/s LVOT diam:     1.90 cm       LV E/e' medial:  16.0 LV SV:         75 ml LV SV Index:   32.58 LVOT Area:     2.84 cm  LV Volumes (MOD) LV area d, A2C:    29.30 cm LV area d, A4C:    29.30 cm LV area s, A2C:    20.60 cm LV area s, A4C:    18.90 cm LV major d, A2C:   7.80 cm LV major d, A4C:   8.13 cm LV major s, A2C:   7.31 cm LV major s, A4C:   6.60 cm LV vol d, MOD A2C: 92.2 ml LV vol d, MOD A4C: 85.6 ml LV vol s, MOD A2C: 51.9 ml LV vol s, MOD A4C: 44.4 ml LV SV MOD A2C:     40.3 ml LV SV MOD A4C:     85.6 ml LV SV MOD BP:      40.7 ml RIGHT VENTRICLE RV S prime:     10.00 cm/s TAPSE (M-mode): 1.6 cm LEFT ATRIUM             Index       RIGHT ATRIUM           Index LA diam:        3.50 cm 1.62 cm/m  RA Area:     10.50 cm LA Vol (A2C):   27.7 ml 12.84 ml/m RA Volume:   20.30 ml  9.41 ml/m LA Vol (A4C):   36.4 ml 16.88 ml/m LA Biplane Vol: 31.8 ml 14.74 ml/m  AORTIC VALVE LVOT Vmax:   96.00 cm/s LVOT Vmean:  71.400 cm/s LVOT VTI:    0.160 m  AORTA Ao Root diam: 2.60 cm MITRAL VALVE MV Area (PHT): 3.93 cm             SHUNTS MV PHT:        55.97 msec           Systemic VTI:  0.16 m MV Decel Time: 193 msec             Systemic Diam: 1.90 cm MR Peak grad: 143.0 mmHg MR Vmax:      598.00 cm/s MV E velocity: 90.60 cm/s 103 cm/s MV A velocity: 83.20 cm/s 70.3 cm/s MV E/A ratio:  1.09       1.5  Cherlynn Kaiser MD Electronically signed by Cherlynn Kaiser MD Signature Date/Time: 08/09/2019/4:19:06 PM    Final    CT HEAD CODE STROKE WO CONTRAST  Result Date: 08/09/2019 CLINICAL  DATA:  Code stroke. Left-sided weakness with slurred speech EXAM: CT HEAD WITHOUT CONTRAST TECHNIQUE: Contiguous axial images were obtained from the base of the skull through the vertex without intravenous contrast. COMPARISON:  09/05/2018 FINDINGS: Brain: There is no mass, hemorrhage or extra-axial collection. There is generalized atrophy without lobar predilection. There is hypoattenuation of the periventricular white matter, most commonly indicating chronic ischemic microangiopathy. There is an old, large posterior right MCA territory infarct. Vascular: Hyperdense right MCA distal M1 and proximal M2 segments. Skull: The visualized skull base, calvarium and extracranial soft tissues are  normal. Sinuses/Orbits: No fluid levels or advanced mucosal thickening of the visualized paranasal sinuses. No mastoid or middle ear effusion. The orbits are normal. ASPECTS [facility] Stroke Program Early CT Score) - Ganglionic level infarction (caudate, lentiform nuclei, internal capsule, insula, M1-M3 cortex): 7 - Supraganglionic infarction (M4-M6 cortex): 3 Total score (0-10 with 10 being normal): 10 IMPRESSION: 1. No acute hemorrhage. 2. Hyperdense right middle cerebral artery distal M1 and proximal M2 segments. 3. ASPECTS is 10. 4. These results were called by telephone at the time of interpretation on 08/09/2019 at 1:57 am to provider Roland Rack, who verbally acknowledged these results. Electronically Signed   By: Ulyses Jarred M.D.   On: 08/09/2019 01:58       Subjective: Patient seen and examined at bedside.  He is sleepy, wakes up only very slightly, very poor historian and hardly answers any questions.  No overnight fever, vomiting or seizures reported.  Discharge Exam: Vitals:   08/19/19 0420 08/19/19 0935  BP: (!) 157/89 (!) 148/87  Pulse: 86 89  Resp: 18 16  Temp: 98.2 F (36.8 C) 99 F (37.2 C)  SpO2: 97% 100%    General: Pt is sleepy, wakes up slightly, hardly answers any questions.  NG  tube in place. Cardiovascular: rate controlled, S1/S2 + Respiratory: bilateral decreased breath sounds at bases Abdominal: Soft, obese, NT, ND, bowel sounds + Extremities: Trace lower extremity edema, no cyanosis    The results of significant diagnostics from this hospitalization (including imaging, microbiology, ancillary and laboratory) are listed below for reference.     Microbiology: Recent Results (from the past 240 hour(s))  SARS CORONAVIRUS 2 (TAT 6-24 HRS) Nasopharyngeal Nasopharyngeal Swab     Status: None   Collection Time: 08/09/19 11:30 AM   Specimen: Nasopharyngeal Swab  Result Value Ref Range Status   SARS Coronavirus 2 NEGATIVE NEGATIVE Final    Comment: (NOTE) SARS-CoV-2 target nucleic acids are NOT DETECTED. The SARS-CoV-2 RNA is generally detectable in upper and lower respiratory specimens during the acute phase of infection. Negative results do not preclude SARS-CoV-2 infection, do not rule out co-infections with other pathogens, and should not be used as the sole basis for treatment or other patient management decisions. Negative results must be combined with clinical observations, patient history, and epidemiological information. The expected result is Negative. Fact Sheet for Patients: SugarRoll.be Fact Sheet for Healthcare Providers: https://www.woods-mathews.com/ This test is not yet approved or cleared by the [place] FDA and  has been authorized for detection and/or diagnosis of SARS-CoV-2 by FDA under an Emergency Use Authorization (EUA). This EUA will remain  in effect (meaning this test can be used) for the duration of the COVID-19 declaration under Section 56 4(b)(1) of the Act, 21 U.S.C. section 360bbb-3(b)(1), unless the authorization is terminated or revoked sooner. Performed at [facility], [place] [address] [place], [place] [address]   Culture, blood (routine x 2)     Status: Abnormal    Collection Time: 08/14/19  9:32 AM   Specimen: BLOOD RIGHT HAND  Result Value Ref Range Status   Specimen Description BLOOD RIGHT HAND  Final   Special Requests   Final    BOTTLES DRAWN AEROBIC ONLY Blood Culture adequate volume   Culture  Setup Time   Final    AEROBIC BOTTLE ONLY GRAM POSITIVE COCCI CRITICAL VALUE NOTED.  VALUE IS CONSISTENT WITH PREVIOUSLY REPORTED AND CALLED VALUE. Performed at [facility], [place] [address], [place], [place] [address]  Culture STAPHYLOCOCCUS CAPITIS (A)  Final   Report Status 08/17/2019 FINAL  Final   Organism ID, Bacteria STAPHYLOCOCCUS CAPITIS  Final      Susceptibility   Staphylococcus capitis - MIC*    CIPROFLOXACIN <=0.5 SENSITIVE Sensitive     ERYTHROMYCIN <=0.25 SENSITIVE Sensitive     GENTAMICIN <=0.5 SENSITIVE Sensitive     OXACILLIN SENSITIVE Sensitive     TETRACYCLINE <=1 SENSITIVE Sensitive     VANCOMYCIN 1 SENSITIVE Sensitive     TRIMETH/SULFA <=10 SENSITIVE Sensitive     CLINDAMYCIN <=0.25 SENSITIVE Sensitive     RIFAMPIN <=0.5 SENSITIVE Sensitive     Inducible Clindamycin NEGATIVE Sensitive     * STAPHYLOCOCCUS CAPITIS  Culture, blood (routine x 2)     Status: Abnormal   Collection Time: 08/14/19  9:32 AM   Specimen: BLOOD RIGHT WRIST  Result Value Ref Range Status   Specimen Description BLOOD RIGHT WRIST  Final   Special Requests   Final    BOTTLES DRAWN AEROBIC ONLY Blood Culture adequate volume   Culture  Setup Time   Final    AEROBIC BOTTLE ONLY GRAM POSITIVE COCCI CRITICAL RESULT CALLED TO, READ BACK BY AND VERIFIED WITH: PHARMD C ROBERTSON [id number] AT 622 AN BY CM Performed at [facility], [place] [address] [place], [place] [address]    Culture STAPHYLOCOCCUS CAPITIS (A)  Final   Report Status 08/17/2019 FINAL  Final   Organism ID, Bacteria STAPHYLOCOCCUS CAPITIS  Final      Susceptibility   Staphylococcus capitis - MIC*    CIPROFLOXACIN <=0.5 SENSITIVE Sensitive     ERYTHROMYCIN <=0.25 SENSITIVE  Sensitive     GENTAMICIN <=0.5 SENSITIVE Sensitive     OXACILLIN <=0.25 SENSITIVE Sensitive     TETRACYCLINE <=1 SENSITIVE Sensitive     VANCOMYCIN 1 SENSITIVE Sensitive     TRIMETH/SULFA <=10 SENSITIVE Sensitive     CLINDAMYCIN <=0.25 SENSITIVE Sensitive     RIFAMPIN <=0.5 SENSITIVE Sensitive     Inducible Clindamycin NEGATIVE Sensitive     * STAPHYLOCOCCUS CAPITIS  Blood Culture ID Panel (Reflexed)     Status: Abnormal   Collection Time: 08/14/19  9:32 AM  Result Value Ref Range Status   Enterococcus species NOT DETECTED NOT DETECTED Final   Listeria monocytogenes NOT DETECTED NOT DETECTED Final   Staphylococcus species DETECTED (A) NOT DETECTED Final    Comment: Methicillin (oxacillin) susceptible coagulase negative staphylococcus. Possible blood culture contaminant (unless isolated from more than one blood culture draw or clinical case suggests pathogenicity). No antibiotic treatment is indicated for blood  culture contaminants. CRITICAL RESULT CALLED TO, READ BACK BY AND VERIFIED WITH: PHARMD C ROBERTSON [id number] AT 625 AM BY CM    Staphylococcus aureus (BCID) NOT DETECTED NOT DETECTED Final   Methicillin resistance NOT DETECTED NOT DETECTED Final   Streptococcus species NOT DETECTED NOT DETECTED Final   Streptococcus agalactiae NOT DETECTED NOT DETECTED Final   Streptococcus pneumoniae NOT DETECTED NOT DETECTED Final   Streptococcus pyogenes NOT DETECTED NOT DETECTED Final   Acinetobacter baumannii NOT DETECTED NOT DETECTED Final   Enterobacteriaceae species NOT DETECTED NOT DETECTED Final   Enterobacter cloacae complex NOT DETECTED NOT DETECTED Final   Escherichia coli NOT DETECTED NOT DETECTED Final   Klebsiella oxytoca NOT DETECTED NOT DETECTED Final   Klebsiella pneumoniae NOT DETECTED NOT DETECTED Final   Proteus species NOT DETECTED NOT DETECTED Final   Serratia marcescens NOT DETECTED NOT DETECTED Final   Haemophilus influenzae NOT DETECTED NOT  DETECTED Final    Neisseria meningitidis NOT DETECTED NOT DETECTED Final   Pseudomonas aeruginosa NOT DETECTED NOT DETECTED Final   Candida albicans NOT DETECTED NOT DETECTED Final   Candida glabrata NOT DETECTED NOT DETECTED Final   Candida krusei NOT DETECTED NOT DETECTED Final   Candida parapsilosis NOT DETECTED NOT DETECTED Final   Candida tropicalis NOT DETECTED NOT DETECTED Final    Comment: Performed at [facility], [place] [address], [place], [place] [address]  Culture, Urine     Status: Abnormal   Collection Time: 08/14/19  3:49 PM   Specimen: Urine, Clean Catch  Result Value Ref Range Status   Specimen Description URINE, CLEAN CATCH  Final   Special Requests   Final    NONE Performed at [facility], [place] [address], [place], [place] [address]    Culture MULTIPLE SPECIES PRESENT, SUGGEST RECOLLECTION (A)  Final   Report Status 08/15/2019 FINAL  Final  Culture, blood (routine x 2)     Status: None (Preliminary result)   Collection Time: 08/15/19  2:30 PM   Specimen: BLOOD  Result Value Ref Range Status   Specimen Description BLOOD RIGHT ANTECUBITAL  Final   Special Requests   Final    BOTTLES DRAWN AEROBIC AND ANAEROBIC Blood Culture adequate volume   Culture   Final    NO GROWTH 3 DAYS Performed at [facility], [place] [address] [place], [place] [address]    Report Status PENDING  Incomplete  Culture, blood (routine x 2)     Status: None (Preliminary result)   Collection Time: 08/15/19  2:35 PM   Specimen: BLOOD  Result Value Ref Range Status   Specimen Description BLOOD LEFT ANTECUBITAL  Final   Special Requests   Final    BOTTLES DRAWN AEROBIC AND ANAEROBIC Blood Culture adequate volume   Culture   Final    NO GROWTH 3 DAYS Performed at [facility], [place] [address] [place], [place] [address]    Report Status PENDING  Incomplete     Labs: BNP (last 3 results) No results for input(s): BNP in the last 8760 hours. Basic Metabolic Panel: Recent Labs  Lab  08/14/19 0428 08/15/19 1051 08/18/19 0329  NA 139 142 139  K 4.2 4.1 4.7  CL 104 105 101  CO2 24 30 27   GLUCOSE 180* 156* 156*  BUN 17 16 22   CREATININE 1.11 1.15 1.16  CALCIUM 9.0 9.1 9.5  MG 1.7  --  1.9   Liver Function Tests: Recent Labs  Lab 08/15/19 1051  AST 37  ALT 30  ALKPHOS 61  BILITOT 0.4  PROT 6.0*  ALBUMIN 2.6*   No results for input(s): LIPASE, AMYLASE in the last 168 hours. Recent Labs  Lab 08/15/19 1051  AMMONIA 19   CBC: Recent Labs  Lab 08/14/19 0932 08/15/19 1051 08/18/19 0329  WBC 7.2 6.6 5.8  NEUTROABS  --   --  3.5  HGB 11.2* 10.9* 12.3*  HCT 33.1* 32.9* 36.3*  MCV 100.0 101.5* 98.9  PLT 194 214 302   Cardiac Enzymes: No results for input(s): CKTOTAL, CKMB, CKMBINDEX, TROPONINI in the last 168 hours. BNP: Invalid input(s): POCBNP CBG: Recent Labs  Lab 08/18/19 1651 08/18/19 1918 08/19/19 0048 08/19/19 0421 08/19/19 0745  GLUCAP 141* 120* 174* 139* 152*   D-Dimer No results for input(s): DDIMER in the last 72 hours. Hgb A1c No results for input(s): HGBA1C in the last 72 hours. Lipid  Profile No results for input(s): CHOL, HDL, LDLCALC, TRIG, CHOLHDL, LDLDIRECT in the last 72 hours. Thyroid function studies No results for input(s): TSH, T4TOTAL, T3FREE, THYROIDAB in the last 72 hours.  Invalid input(s): FREET3 Anemia work up No results for input(s): VITAMINB12, FOLATE, FERRITIN, TIBC, IRON, RETICCTPCT in the last 72 hours. Urinalysis    Component Value Date/Time   COLORURINE YELLOW 12/08/2018 1017   APPEARANCEUR CLEAR 12/08/2018 1017   LABSPEC 1.020 12/08/2018 1017   PHURINE 6.0 12/08/2018 1017   GLUCOSEU NEGATIVE 12/08/2018 1017   HGBUR TRACE-INTACT (A) 12/08/2018 1017   BILIRUBINUR NEGATIVE 12/08/2018 [place] 12/08/2018 1017   PROTEINUR NEGATIVE 09/05/2018 1213   UROBILINOGEN 0.2 12/08/2018 1017   NITRITE NEGATIVE 12/08/2018 1017   LEUKOCYTESUR NEGATIVE 12/08/2018 1017   Sepsis Labs Invalid  input(s): PROCALCITONIN,  WBC,  LACTICIDVEN Microbiology Recent Results (from the past 240 hour(s))  SARS CORONAVIRUS 2 (TAT 6-24 HRS) Nasopharyngeal Nasopharyngeal Swab     Status: None   Collection Time: 08/09/19 11:30 AM   Specimen: Nasopharyngeal Swab  Result Value Ref Range Status   SARS Coronavirus 2 NEGATIVE NEGATIVE Final    Comment: (NOTE) SARS-CoV-2 target nucleic acids are NOT DETECTED. The SARS-CoV-2 RNA is generally detectable in upper and lower respiratory specimens during the acute phase of infection. Negative results do not preclude SARS-CoV-2 infection, do not rule out co-infections with other pathogens, and should not be used as the sole basis for treatment or other patient management decisions. Negative results must be combined with clinical observations, patient history, and epidemiological information. The expected result is Negative. Fact Sheet for Patients: SugarRoll.be Fact Sheet for Healthcare Providers: https://www.woods-mathews.com/ This test is not yet approved or cleared by the [place] FDA and  has been authorized for detection and/or diagnosis of SARS-CoV-2 by FDA under an Emergency Use Authorization (EUA). This EUA will remain  in effect (meaning this test can be used) for the duration of the COVID-19 declaration under Section 56 4(b)(1) of the Act, 21 U.S.C. section 360bbb-3(b)(1), unless the authorization is terminated or revoked sooner. Performed at [facility], [place] [address], [place], [place] [address]   Culture, blood (routine x 2)     Status: Abnormal   Collection Time: 08/14/19  9:32 AM   Specimen: BLOOD RIGHT HAND  Result Value Ref Range Status   Specimen Description BLOOD RIGHT HAND  Final   Special Requests   Final    BOTTLES DRAWN AEROBIC ONLY Blood Culture adequate volume   Culture  Setup Time   Final    AEROBIC BOTTLE ONLY GRAM POSITIVE COCCI CRITICAL VALUE NOTED.  VALUE IS  CONSISTENT WITH PREVIOUSLY REPORTED AND CALLED VALUE. Performed at [facility], [place] [address] [place], [place] [address]    Culture STAPHYLOCOCCUS CAPITIS (A)  Final   Report Status 08/17/2019 FINAL  Final   Organism ID, Bacteria STAPHYLOCOCCUS CAPITIS  Final      Susceptibility   Staphylococcus capitis - MIC*    CIPROFLOXACIN <=0.5 SENSITIVE Sensitive     ERYTHROMYCIN <=0.25 SENSITIVE Sensitive     GENTAMICIN <=0.5 SENSITIVE Sensitive     OXACILLIN SENSITIVE Sensitive     TETRACYCLINE <=1 SENSITIVE Sensitive     VANCOMYCIN 1 SENSITIVE Sensitive     TRIMETH/SULFA <=10 SENSITIVE Sensitive     CLINDAMYCIN <=0.25 SENSITIVE Sensitive     RIFAMPIN <=0.5 SENSITIVE Sensitive     Inducible Clindamycin NEGATIVE Sensitive     * STAPHYLOCOCCUS CAPITIS  Culture,  blood (routine x 2)     Status: Abnormal   Collection Time: 08/14/19  9:32 AM   Specimen: BLOOD RIGHT WRIST  Result Value Ref Range Status   Specimen Description BLOOD RIGHT WRIST  Final   Special Requests   Final    BOTTLES DRAWN AEROBIC ONLY Blood Culture adequate volume   Culture  Setup Time   Final    AEROBIC BOTTLE ONLY GRAM POSITIVE COCCI CRITICAL RESULT CALLED TO, READ BACK BY AND VERIFIED WITH: PHARMD C ROBERTSON [id number] AT 56 AN BY CM Performed at [facility], [place] [address] [place], [place] [address]    Culture STAPHYLOCOCCUS CAPITIS (A)  Final   Report Status 08/17/2019 FINAL  Final   Organism ID, Bacteria STAPHYLOCOCCUS CAPITIS  Final      Susceptibility   Staphylococcus capitis - MIC*    CIPROFLOXACIN <=0.5 SENSITIVE Sensitive     ERYTHROMYCIN <=0.25 SENSITIVE Sensitive     GENTAMICIN <=0.5 SENSITIVE Sensitive     OXACILLIN <=0.25 SENSITIVE Sensitive     TETRACYCLINE <=1 SENSITIVE Sensitive     VANCOMYCIN 1 SENSITIVE Sensitive     TRIMETH/SULFA <=10 SENSITIVE Sensitive     CLINDAMYCIN <=0.25 SENSITIVE Sensitive     RIFAMPIN <=0.5 SENSITIVE Sensitive     Inducible Clindamycin NEGATIVE Sensitive      * STAPHYLOCOCCUS CAPITIS  Blood Culture ID Panel (Reflexed)     Status: Abnormal   Collection Time: 08/14/19  9:32 AM  Result Value Ref Range Status   Enterococcus species NOT DETECTED NOT DETECTED Final   Listeria monocytogenes NOT DETECTED NOT DETECTED Final   Staphylococcus species DETECTED (A) NOT DETECTED Final    Comment: Methicillin (oxacillin) susceptible coagulase negative staphylococcus. Possible blood culture contaminant (unless isolated from more than one blood culture draw or clinical case suggests pathogenicity). No antibiotic treatment is indicated for blood  culture contaminants. CRITICAL RESULT CALLED TO, READ BACK BY AND VERIFIED WITH: PHARMD C ROBERTSON [id number] AT 625 AM BY CM    Staphylococcus aureus (BCID) NOT DETECTED NOT DETECTED Final   Methicillin resistance NOT DETECTED NOT DETECTED Final   Streptococcus species NOT DETECTED NOT DETECTED Final   Streptococcus agalactiae NOT DETECTED NOT DETECTED Final   Streptococcus pneumoniae NOT DETECTED NOT DETECTED Final   Streptococcus pyogenes NOT DETECTED NOT DETECTED Final   Acinetobacter baumannii NOT DETECTED NOT DETECTED Final   Enterobacteriaceae species NOT DETECTED NOT DETECTED Final   Enterobacter cloacae complex NOT DETECTED NOT DETECTED Final   Escherichia coli NOT DETECTED NOT DETECTED Final   Klebsiella oxytoca NOT DETECTED NOT DETECTED Final   Klebsiella pneumoniae NOT DETECTED NOT DETECTED Final   Proteus species NOT DETECTED NOT DETECTED Final   Serratia marcescens NOT DETECTED NOT DETECTED Final   Haemophilus influenzae NOT DETECTED NOT DETECTED Final   Neisseria meningitidis NOT DETECTED NOT DETECTED Final   Pseudomonas aeruginosa NOT DETECTED NOT DETECTED Final   Candida albicans NOT DETECTED NOT DETECTED Final   Candida glabrata NOT DETECTED NOT DETECTED Final   Candida krusei NOT DETECTED NOT DETECTED Final   Candida parapsilosis NOT DETECTED NOT DETECTED Final   Candida tropicalis NOT DETECTED  NOT DETECTED Final    Comment: Performed at [facility], [place]. [address] [place], [place] [address]  Culture, Urine     Status: Abnormal   Collection Time: 08/14/19  3:49 PM   Specimen: Urine, Clean Catch  Result Value Ref Range Status   Specimen Description URINE, CLEAN CATCH  Final  Special Requests   Final    NONE Performed at [facility], [place] [address] [place], [place] [address]    Culture MULTIPLE SPECIES PRESENT, SUGGEST RECOLLECTION (A)  Final   Report Status 08/15/2019 FINAL  Final  Culture, blood (routine x 2)     Status: None (Preliminary result)   Collection Time: 08/15/19  2:30 PM   Specimen: BLOOD  Result Value Ref Range Status   Specimen Description BLOOD RIGHT ANTECUBITAL  Final   Special Requests   Final    BOTTLES DRAWN AEROBIC AND ANAEROBIC Blood Culture adequate volume   Culture   Final    NO GROWTH 3 DAYS Performed at [facility], [place] [address], [place],  [address]    Report Status PENDING  Incomplete  Culture, blood (routine x 2)     Status: None (Preliminary result)   Collection Time: 08/15/19  2:35 PM   Specimen: BLOOD  Result Value Ref Range Status   Specimen Description BLOOD LEFT ANTECUBITAL  Final   Special Requests   Final    BOTTLES DRAWN AEROBIC AND ANAEROBIC Blood Culture adequate volume   Culture   Final    NO GROWTH 3 DAYS Performed at [facility], [place] [address], [place],  [address]    Report Status PENDING  Incomplete     Time coordinating discharge: 35 minutes  SIGNED:   Aline August, MD  [facility] 08/19/2019, 9:55 AM

## 2019-08-19 NOTE — Progress Notes (Signed)
Jamse Arn, MD  Physician  Physical Medicine and Rehabilitation  PMR Pre-admission  Addendum  Date of Service:  08/18/2019 10:16 AM      Related encounter: ED to Hosp-Admission (Current) from 08/09/2019 in [facility]        Show:Clear all [x] Manual[x] Template[x] Copied  Added by: [x] Tsuneo Faison, Vertis Kelch, RN[x] Jamse Arn, MD  [] Hover for details PMR Admission Coordinator Pre-Admission Assessment   Patient: Andres [name] is an 79 y.o., male MRN: [id number] DOB: 1940-10-11 Height: 5\' 7"  (170.2 cm) Weight: 105 kg                                                                                                                                                  Insurance Information HMO:     PPO: yes     PCP:      IPA:      80/20:      OTHER:  PRIMARY: [employer] Medicare advantage plan      Policy#: [id number]      Subscriber: pt CM Name: Anderson Malta at [facility] who manages [facility]      Phone#: [phone]     Fax#: [id number] Pre-Cert#: [id number] approved until 1/22 but updates due 1/19 though    Employer:  Benefits:  Phone # [phone]     Name: 1/14 Eff. Date: 08/05/2019     Deduct: none      Out of Pocket Max: we are listed as out of network so $10,000/ in network is $5500      Life Max: none CIR: out of network 65% / in network is $325 co pay per days days 1 until 5      SNF: out of network and in network is no co pay days 1 until 20; $184 co pay per dyad days 21 until 100 100 days per year Outpatient: out of network is 65% / in network is $40 co pay     Co-Pay: visits per medical neccesity Home Health: out of network is 65% / in network is 100%      Co-Pay: visits per medical neccesity DME: 80%     Co-Pay: 20% Providers: we are out of network; in network also quoted above  SECONDARY: none       Medicaid Application Date:       Case Manager:  Disability Application Date:       Case Worker:    The "Data Collection Information Summary" for  patients in Inpatient Rehabilitation Facilities with attached "Privacy Act [place] Records" was provided and verbally reviewed with: Family   Emergency Contact Information Contact Information       Name Relation Home Work Mobile    [place] Daughter [place] Son [phone]   [phone]  Current Medical History  Patient Admitting Diagnosis: CVA   History of Present Illness:79 year old right-handed male with history of hypertension, CKD stage III, morbid obesity with BMI 35.70, hyperlipidemia, history of seizure maintained on Keppra, CAD status post CABG, history of CVA maintained on aspirin 81 mg daily and Plavix, carotid artery stenosis, mitral valve prolapse, diet-controlled diabetes mellitus, BPH and remote tobacco abuse.   Presented 08/09/2019 after being found down the left side weakness and slurred speech.  Cranial CT scan showed hyperdense right middle cerebral artery distal M1 and proximal M2 segments.  No acute hemorrhage.  Patient did not receive TPA.  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  Carotid and aortic atherosclerosis without stenosis.  MRI showed large area of acute infarction right MCA territory surrounding chronic infarct in the right parietal lobe.  Echocardiogram with ejection fraction of 60% without emboli.  EEG negative for seizure.  Admission chemistries with creatinine 1.35, SARS coronavirus negative, hemoglobin 13.8.  Neurology follow-up currently remains on aspirin and Plavix therapy for CVA prophylaxis.  Keppra ongoing as prior to admission for history of seizure disorder.  Blood cultures did show coagulase-negative staph question contaminant and currently remains on cefazolin per infectious disease with repeat blood cultures pending and no current plan for TEE.  Patient currently remains n.p.o. with alternative means of nutritional support.      Complete NIHSS TOTAL: 15 Glasgow Coma Scale Score: 14   Past Medical History      Past Medical History:  Diagnosis Date  . ALLERGIC RHINITIS    . Benign prostatic hypertrophy    . 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 Last nuc 2012 negative. 07/21/2013  . Carpal tunnel syndrome, bilateral    . Cataracts, bilateral    . Coronary artery disease      Dr. Claiborne Billings; 2D ECHO, 12/11/2011 - EF 50-55%, normal; NUCLEAR STRESS TEST, 10/16/2010 - no evidemce of inducible ischemia  . Diabetes mellitus type II    . Diverticulosis of colon    . GERD (gastroesophageal reflux disease)    . H/O hiatal hernia    . Head mass    . Hepatitis    . Hx of   sessile serrated colonic polyp 02/27/2015  . Hyperlipidemia    . Hypertension    . Inguinal hernia, Rt reduced, Lt present 07/21/2013  . Low back pain    . Mitral valve prolapse 11/24/2013    By echo dec 2014  . Nephrolithiasis    . Obesity, morbid ([place])    . Occlusion and stenosis of carotid artery without mention of cerebral infarction      CAROTID DOPPLER, 03/18/2012 - srable, mild, hard, plaque noted, bilaterally  . Stroke [facility])        Family History  family history includes Alzheimer's disease in his father; Diabetes in his brother, mother, and sister; Heart attack in his brother, maternal grandmother, and mother; Hypertension in his maternal grandmother and son; Kidney disease in his brother and son; Pancreatic cancer in his brother.   Prior Rehab/Hospitalizations:  Has the patient had prior rehab or hospitalizations prior to admission? Yes   Has the patient had major surgery during 100 days prior to admission? No   Current Medications    Current Facility-Administered Medications:  .   stroke: mapping our early stages of recovery book, , Does not apply, Once, Tamala Julian, Rondell A, MD .  acetaminophen (TYLENOL) tablet 650  mg, 650 mg, Oral, Q4H PRN, 650 mg at 08/18/19 1038 **OR** acetaminophen (TYLENOL) 160 MG/5ML solution 650 mg, 650  mg, Per Tube, Q4H PRN, 650 mg at 08/14/19 1605 **OR** acetaminophen (TYLENOL) suppository 650 mg, 650 mg, Rectal, Q4H PRN, Tamala Julian, Rondell A, MD .  aspirin suppository 300 mg, 300 mg, Rectal, Daily, 300 mg at 08/16/19 0935 **OR** aspirin chewable tablet 81 mg, 81 mg, Per Tube, Daily, Mariel Aloe, MD, 81 mg at 08/18/19 0957 .  atorvastatin (LIPITOR) tablet 40 mg, 40 mg, Per Tube, Daily, Mariel Aloe, MD, 40 mg at 08/18/19 0956 .  ceFAZolin (ANCEF) IVPB 2g/100 mL premix, 2 g, Intravenous, Q8H, Michel Bickers, MD, Last Rate: 200 mL/hr at 08/19/19 0126, 2 g at 08/19/19 0126 .  clopidogrel (PLAVIX) tablet 75 mg, 75 mg, Per Tube, Daily, Mariel Aloe, MD, 75 mg at 08/18/19 0957 .  diclofenac sodium (VOLTAREN) 1 % transdermal gel 2 g, 2 g, Topical, Q6H PRN, Smith, Rondell A, MD .  enoxaparin (LOVENOX) injection 50 mg, 0.5 mg/kg, Subcutaneous, Q24H, Alekh, Kshitiz, MD, 50 mg at 08/18/19 1611 .  feeding supplement (JEVITY 1.2 CAL) liquid 1,000 mL, 1,000 mL, Per Tube, Continuous, Mariel Aloe, MD, Last Rate: 65 mL/hr at 08/16/19 1353, Restarted at 08/16/19 1353 .  feeding supplement (PRO-STAT SUGAR FREE 64) liquid 30 mL, 30 mL, Per Tube, Daily, Mariel Aloe, MD, 30 mL at 08/18/19 0954 .  insulin aspart (novoLOG) injection 0-9 Units, 0-9 Units, Subcutaneous, Q4H, Kirby-Graham, Karsten Fells, NP, 1 Units at 08/19/19 0520 .  levETIRAcetam (KEPPRA) 750 mg in sodium chloride 0.9 % 100 mL IVPB, 750 mg, Intravenous, Q12H, Greta Doom, MD, Last Rate: 430 mL/hr at 08/18/19 2158, 750 mg at 08/18/19 2158 .  LORazepam (ATIVAN) injection 1 mg, 1 mg, Intravenous, PRN, Lora Havens, MD .  metoprolol tartrate (LOPRESSOR) tablet 25 mg, 25 mg, Per Tube, BID, Blount, Xenia T, NP .  polyethylene glycol (MIRALAX / GLYCOLAX) packet 17 g, 17 g, Oral, Daily PRN, Mariel Aloe, MD, 17 g at 08/16/19 1846 .  tamsulosin (FLOMAX) capsule 0.4 mg, 0.4 mg, Oral, Daily, Smith, Rondell A, MD, 0.4 mg at 08/18/19 0957     Patients Current Diet:  Diet Order                  Diet NPO time specified  Diet effective now                      Precautions / Restrictions Precautions Precautions: Fall Restrictions Weight Bearing Restrictions: No    Has the patient had 2 or more falls or a fall with injury in the past year?No   Prior Activity Level Community (5-7x/wk): independent    Prior Functional Level Prior Function Level of Independence: Independent Comments: walking in neighborhood to get to church   Self Care: Did the patient need help bathing, dressing, using the toilet or eating?  Independent   Indoor Mobility: Did the patient need assistance with walking from room to room (with or without device)? Independent   Stairs: Did the patient need assistance with internal or external stairs (with or without device)? Independent   Functional Cognition: Did the patient need help planning regular tasks such as shopping or remembering to take medications? Independent   Home Assistive Devices / Equipment Home Assistive Devices/Equipment: None Home Equipment: Cane - single point   Prior Device Use: Indicate devices/aids used by the patient prior to current illness, exacerbation  or injury?  cane   Current Functional Level Cognition   Arousal/Alertness: Lethargic Overall Cognitive Status: Impaired/Different from baseline Current Attention Level: Focused Orientation Level: Oriented to person Following Commands: Follows one step commands inconsistently, Follows one step commands with increased time Safety/Judgement: Decreased awareness of safety, Decreased awareness of deficits General Comments: Pt performed mobility with increased effort.  He has moments where he looses focus and requires increased time to rengage in activity. Attention: Sustained Sustained Attention: Impaired    Extremity Assessment (includes Sensation/Coordination)   Upper Extremity Assessment: LUE deficits/detail,  Difficult to assess due to impaired cognition LUE Deficits / Details: Noting minimal active movement of left UE LUE Coordination: decreased gross motor, decreased fine motor  Lower Extremity Assessment: Defer to PT evaluation     ADLs   Overall ADL's : Needs assistance/impaired Eating/Feeding: NPO Grooming: Moderate assistance Upper Body Bathing: Maximal assistance, Sitting, Bed level Lower Body Bathing: Maximal assistance, +2 for physical assistance, Sit to/from stand, Bed level Upper Body Dressing : Maximal assistance, Sitting, Bed level Lower Body Dressing: Maximal assistance, Sit to/from stand, Bed level Toilet Transfer: Maximal assistance, +2 for physical assistance(simulated at EOB) Toileting- Clothing Manipulation and Hygiene: Total assistance, +2 for physical assistance, Bed level Toileting - Clothing Manipulation Details (indicate cue type and reason): Pt requiring Total A for peri care after urinary incontience in the bed. Pt with deceased following of commands for rolling in bed.  Functional mobility during ADLs: Total assistance(bed mobility only) General ADL Comments: Pt presenting with decreased arousal and significant lethargy. Pt with urinary incontience in bed and requiring Total A for bed mobility and peri care. Repositioning in bed and placing bed in chair position. Pt occasionally opening eyes to his name.      Mobility   Overal bed mobility: Needs Assistance Bed Mobility: Rolling Rolling: Total assist, +2 for physical assistance Sidelying to sit: Max assist Sit to supine: Max assist, +2 for physical assistance General bed mobility comments: Pt requiring Total A +2 for rolling in bed during toileting as pt with urinary incontience. Requiring Total A +2 for repositioning. Pt actively moving all 4 extremities, esp LLE and RUE, but not in an assistive way, sometimes resistive.       Transfers   Overall transfer level: Needs assistance Equipment used: Ambulation equipment  used(sara stedy.) Transfers: Sit to/from Stand Sit to Stand: +2 physical assistance, Max assist(Mod +1 during first sit to stand and max +2 for addtional sit to stand when patient not following commands.) General transfer comment: not alert enough to attempt transfers     Ambulation / Gait / Stairs / Wheelchair Mobility   Ambulation/Gait Ambulation/Gait assistance: (NT) General Gait Details: unable     Posture / Balance Dynamic Sitting Balance Sitting balance - Comments: Posterior push and lean. Balance Overall balance assessment: Needs assistance Sitting-balance support: No upper extremity supported, Feet supported Sitting balance-Leahy Scale: Poor Sitting balance - Comments: Posterior push and lean. Standing balance support: Single extremity supported, During functional activity Standing balance-Leahy Scale: Poor Standing balance comment: +2 external assistance to maintain standing balance.     Special needs/care consideration BiPAP/CPAP n/a CPM n/a Continuous Drip IV n/a Dialysis n/a Life Vest n/a Oxygen n/a Special Bed seizure precautions Trach Size n/a Wound Vac n/a Skin intact Bowel mgmt: incontinent Bladder mgmt: external catheter Diabetic mgmt  Behavioral consideration  N/a Chemo/radiation  N/a Designated visitor is daughter, Franklyn Lor for tube feeds placed 1/8 43 inches 10 fr left nare    Previous Home  Environment  Living Arrangements: Alone  Lives With: Son Available Help at Discharge: Family, Available 24 hours/day Type of Home: House Home Layout: One level Home Access: Stairs to enter Entrance Stairs-Rails: None Entrance Stairs-Number of Steps: 2 Bathroom Shower/Tub: Public librarian, Industrial/product designer: Yes How Accessible: Accessible via walker [place]: No Additional Comments: son live with pt pta   Discharge Living Setting Plans for Discharge Living Setting: Lives with (comment) Type of Home at  Discharge: House Discharge Home Layout: One level Alternate Level Stairs-Rails: None Discharge Home Access: Stairs to enter Entrance Stairs-Rails: None Entrance Stairs-Number of Steps: 1 Discharge Bathroom Shower/Tub: Tub only, Walk-in shower Discharge Bathroom Toilet: Standard Discharge Bathroom Accessibility: Yes How Accessible: Accessible via walker Does the patient have any problems obtaining your medications?: No   Social/Family/Support Systems Patient Roles: Parent Contact Information: [name], daughter Anticipated Caregiver: [name], daughter and hired caregivers Anticipated Ambulance person Information: [phone] Ability/Limitations of Caregiver: [name] works for Health care SNF facilities in some capacity Caregiver Availability: 24/7(wiht hired caregivers) Discharge Plan Discussed with Primary Caregiver: Yes Is Caregiver In Agreement with Plan?: Yes Does Caregiver/Family have Issues with Lodging/Transportation while Pt is in Rehab?: No   Goals/Additional Needs Patient/Family Goal for Rehab: supervision to min asisst with PT, OT, and Min/Mod A SLP Expected length of stay: ELOS 20-25 days. Dietary Needs: CORTRAKE for tube feeds Special Service Needs: Patient to move to [place] with [name], daughter Pt/Family Agrees to Admission and willing to participate: Yes Program Orientation Provided & Reviewed with Pt/Caregiver Including Roles  & Responsibilities: Yes   Decrease burden of Care through IP rehab admission:    Possible need for SNF placement upon discharge:   Patient Condition: This patient's medical and functional status has changed since the consult dated: 08/15/2019 in which the Rehabilitation Physician determined and documented that the patient's condition is appropriate for intensive rehabilitative care in an inpatient rehabilitation facility. See "History of Present Illness" (above) for medical update. Functional changes are: mod to max assist. Patient's medical and  functional status update has been discussed with the Rehabilitation physician and patient remains appropriate for inpatient rehabilitation. Will admit to inpatient rehab today.   Preadmission Screen Completed By:  Cleatrice Burke, RN, 08/19/2019 10:58 AM ______________________________________________________________________   Discussed status with Dr. Posey Pronto on 08/19/2019 at 38 and received approval for admission today.   Admission Coordinator:  Cleatrice Burke, time 1100 Date 08/19/2019     Revision History

## 2019-08-19 NOTE — Progress Notes (Signed)
  Speech Language Pathology Treatment: Dysphagia  Patient Details Name: Andres [name] MRN: [id number] DOB: [birth year] Today's Date: 08/19/2019 Time: [id number] SLP Time Calculation (min) (ACUTE ONLY): 16 min  Assessment / Plan / Recommendation Clinical Impression  SLP followed up for PO trials. Mentation remains barrier for PO readiness. Pt unable to follow commands, largely lethargic despite multimodal cues/stimulation. Pt with brief periods of opening eyes. Poor management of saliva noted at baseline with right sided anterior spillage, drooling. Oral suction provided with saliva pooling in right buccal cavity. Trialed single ice chip via teaspoon. Pt with some oral manipulation though unable to propel to hypopharynx. No palpable swallow exhibited. Ice chip removed from oral cavity. Continue NPO with alternative means of nutrition. SLP to continue to follow for PO readiness, cognitive linguistic intervention.     HPI HPI: 79 y.o. male with history of stroke, HTN, HLD, DB, CAD, obesity, carotid disease, seizures, found down by son at home confused with L sided weakness. MRI: acute right MCA infarct surrounding old R MCA parietal infarct.       SLP Plan  Continue with current plan of care       Recommendations  Diet recommendations: NPO Medication Administration: Via alternative means Postural Changes and/or Swallow Maneuvers: Seated upright 90 degrees                Oral Care Recommendations: Oral care QID Follow up Recommendations: Skilled Nursing facility SLP Visit Diagnosis: Dysphagia, unspecified (R13.10) Plan: Continue with current plan of care       [place]  MA, [facility]  08/19/2019, 8:57 AM

## 2019-08-19 NOTE — Progress Notes (Signed)
Last night this nurse and 3 other staff got him up into the chair.  He was a very heavy assist and was resisting and attempting to climb out of the bed before we got him up. He kept saying he was going home.  We finally got him calmed down and into the chair and I did call his daughter on facetime so she could speak with him and see him up in the chair.  He was calmer in the chair so we did leave him up in the chair for 4 hours, and reclined the chair and used pillows to position him back and forth left and right q 2 hours.  He was very calm in the chair.  His daughter said he usually sleeps in a chair at home.  She was very thankful for [place] getting him up and for suggesting the facetime call to allow her to see him.  She said she will be here later today to see him and hopes he will be up in the chair daily.

## 2019-08-19 NOTE — H&P (Signed)
Physical Medicine and Rehabilitation Admission H&P    Chief Complaint  Patient presents with  . Code Stroke  : HPI: Andres [name] is an 79 year old right-handed male with history of hypertension, CKD stage III, morbid obesity with BMI 35.70, hyperlipidemia, history of seizure maintained on Keppra, CAD status post CABG, history of CVA maintained on aspirin 81 mg daily and Plavix, carotid artery stenosis, mitral valve prolapse, diet-controlled diabetes mellitus, BPH and remote tobacco abuse.  History taken from chart review and therapist due to cognition.  Patient lives with son.  Reportedly independent prior to admission using a single-point cane.  Presented on 08/09/2019 on the floor with left-sided weakness and slurred speech.  Cranial CT revealed hyperdense right MCA, distal M1 and proximal M2 segments.  No acute hemorrhage.  Patient did not receive TPA.  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  Carotid and aortic atherosclerosis without stenosis.  MRI showed large area of acute infarction right MCA territory surrounding chronic infarct in the right parietal lobe.  Echocardiogram with ejection fraction of 60% without emboli.  EEG negative for seizure.  Admission chemistries with creatinine 1.35, SARS coronavirus negative, hemoglobin 13.8.  Neurology follow-up currently remains on aspirin and Plavix therapy for CVA prophylaxis.  Subcutaneous Lovenox for DVT prophylaxis.  Keppra ongoing as prior to admission for history of seizure disorder.  Blood cultures did show coagulase-negative staph question contaminant and remained on cefazolin per infectious disease with repeat blood cultures with no growth to date and no plan for TEE with recommendations to complete Ancef 08/19/2019.  Patient currently remains n.p.o. with alternative means of nutritional support.  Therapy evaluations completed and patient was admitted for a comprehensive  rehab program.  Please see preadmission assessment from earlier today as well.  Review of Systems  Unable to perform ROS: Mental acuity   Past Medical History:  Diagnosis Date  . ALLERGIC RHINITIS   . Benign prostatic hypertrophy   . 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 Last nuc 2012 negative. 07/21/2013  . Carpal tunnel syndrome, bilateral   . Cataracts, bilateral   . Coronary artery disease    Dr. Claiborne Billings; 2D ECHO, 12/11/2011 - EF 50-55%, normal; NUCLEAR STRESS TEST, 10/16/2010 - no evidemce of inducible ischemia  . Diabetes mellitus type II   . Diverticulosis of colon   . GERD (gastroesophageal reflux disease)   . H/O hiatal hernia   . Head mass   . Hepatitis   . Hx of   sessile serrated colonic polyp 02/27/2015  . Hyperlipidemia   . Hypertension   . Inguinal hernia, Rt reduced, Lt present 07/21/2013  . Low back pain   . Mitral valve prolapse 11/24/2013   By echo dec 2014  . Nephrolithiasis   . Obesity, morbid ([place])   . Occlusion and stenosis of carotid artery without mention of cerebral infarction    CAROTID DOPPLER, 03/18/2012 - srable, mild, hard, plaque noted, bilaterally  . Stroke [facility])    Past Surgical History:  Procedure Laterality Date  . APPENDECTOMY    . CARDIAC CATHETERIZATION    . CATARACT EXTRACTION Right   . CORONARY ARTERY BYPASS GRAFT    . INGUINAL HERNIA REPAIR Right 07/26/2013   Procedure: REPAIR  INCARCERATED RIGHT INGUINAL  HERNIA ;  Surgeon: Gwenyth Ober, MD;  Location: [facility];  Service: General;  Laterality: Right;   Family History  Problem Relation Age of  Onset  . Heart attack Mother   . Diabetes Mother   . Heart attack Brother   . Heart attack Maternal Grandmother   . Hypertension Maternal Grandmother   . Pancreatic cancer Brother   . Kidney disease Brother   . Hypertension Son   . Kidney disease Son        ?  Marland Kitchen Alzheimer's disease Father   . Diabetes Brother   . Diabetes Sister    Social History:  reports that he quit  smoking about 29 years ago. His smoking use included cigarettes. He has never used smokeless tobacco. He reports that he does not drink alcohol or use drugs. Allergies: No Known Allergies Medications Prior to Admission  Medication Sig Dispense Refill  . Acetaminophen 500 MG coapsule Take 500 mg by mouth every 6 (six) hours as needed for mild pain. Reported on 07/21/2015    . Ascorbic Acid (VITAMIN C) 1000 MG tablet Take 1,000 mg by mouth daily.    . Aspirin (ADULT ASPIRIN LOW STRENGTH) 81 MG EC tablet Take 81 mg by mouth daily.      Marland Kitchen atorvastatin (LIPITOR) 40 MG tablet Take 1 tablet (40 mg total) by mouth daily. (Patient taking differently: Take 40 mg by mouth daily at 6 PM. ) 90 tablet 3  . B Complex Vitamins (VITAMIN B-COMPLEX PO) Take 1 tablet by mouth daily.    . clopidogrel (PLAVIX) 75 MG tablet Take 1 tablet (75 mg total) by mouth daily. 90 tablet 3  . hydrochlorothiazide (MICROZIDE) 12.5 MG capsule Take 1 capsule (12.5 mg total) by mouth daily. 90 capsule 3  . levETIRAcetam (KEPPRA) 500 MG tablet Take 1 tablet (500 mg total) by mouth 2 (two) times daily. 180 tablet 3  . losartan (COZAAR) 50 MG tablet Take 1 tablet (50 mg total) by mouth daily. 90 tablet 3  . meloxicam (MOBIC) 7.5 MG tablet Take 1 tablet (7.5 mg total) by mouth daily. (Patient taking differently: Take 7.5 mg by mouth daily as needed for pain. ) 30 tablet 0  . Multiple Vitamin (MULTIVITAMIN) capsule Take 1 capsule by mouth daily.     . Omega-3 Fatty Acids (OMEGA 3 PO) Take 360 mg by mouth 2 (two) times daily.    Marland Kitchen VITAMIN D PO Take 1 tablet by mouth daily.    . [DISCONTINUED] metoprolol tartrate (LOPRESSOR) 50 MG tablet Take 1 tablet (50 mg total) by mouth 2 (two) times daily. 180 tablet 3  . [DISCONTINUED] tamsulosin (FLOMAX) 0.4 MG CAPS capsule Take 1 capsule (0.4 mg total) by mouth daily. 90 capsule 3  . diclofenac sodium (VOLTAREN) 1 % GEL Apply 2 g topically every 6 (six) hours as needed (for arthritis). (Patient not  taking: Reported on [id number]) [id number] g 3  . folic acid (FOLVITE) 1 MG tablet Take 1 tablet (1 mg total) by mouth daily. (Patient not taking: Reported on 08/10/2019) 90 tablet 3    Drug Regimen Review Drug regimen was reviewed and remains appropriate with no significant issues identified  Home: Home Living Family/patient expects to be discharged to:: Private residence Living Arrangements: Alone Available Help at Discharge: Family, Available 24 hours/day Type of Home: House Home Access: Stairs to enter [employer] of Steps: 2 Entrance Stairs-Rails: None Home Layout: One level Bathroom Shower/Tub: Tub/shower unit, Architectural technologist: Standard Bathroom Accessibility: Yes Home Equipment: Cane - single point Additional Comments: son live with pt pta  Lives With: Son   Functional History: Prior Function Level of Independence: Independent Comments: walking  in neighborhood to get to church  Functional Status:  Mobility: Bed Mobility Overal bed mobility: Needs Assistance Bed Mobility: Rolling Rolling: (Pt in R sidelying.) Sidelying to sit: Total assist Sit to supine: Max assist, +2 for physical assistance General bed mobility comments: Total assistance to mobilize to edge of bed.  He presents with involuntary myclonic jerking of LLE and keep transitioning it back to bed.  Total assistance to elevate trunk into a seated position.  He was able to manage sitting balance with use of RUE but at times required mod-max assistace to correct balance and move back to midline. Transfers Overall transfer level: Needs assistance Equipment used: Ambulation equipment used(sara stedy) Transfers: Sit to/from Stand Sit to Stand: Mod assist General transfer comment: Pt more alert but at times looses focus.  When focused he was able to achieve standing x2 bouts and maintain standing for 3-4 min.  Moderate assistance to weight shift forward and boost into a standing position.  Hand over hand  on R to maintain hold to sara stedy. Ambulation/Gait Ambulation/Gait assistance: (NT) General Gait Details: unable    ADL: ADL Overall ADL's : Needs assistance/impaired Eating/Feeding: NPO Grooming: Moderate assistance Upper Body Bathing: Maximal assistance, Sitting, Bed level Lower Body Bathing: Maximal assistance, +2 for physical assistance, Sit to/from stand, Bed level Upper Body Dressing : Maximal assistance, Sitting, Bed level Lower Body Dressing: Maximal assistance, Sit to/from stand, Bed level Toilet Transfer: Maximal assistance, +2 for physical assistance(simulated at EOB) Toileting- Clothing Manipulation and Hygiene: Total assistance, +2 for physical assistance, Bed level Toileting - Clothing Manipulation Details (indicate cue type and reason): Pt requiring Total A for peri care after urinary incontience in the bed. Pt with deceased following of commands for rolling in bed.  Functional mobility during ADLs: Total assistance(bed mobility only) General ADL Comments: Pt presenting with decreased arousal and significant lethargy. Pt with urinary incontience in bed and requiring Total A for bed mobility and peri care. Repositioning in bed and placing bed in chair position. Pt occasionally opening eyes to his name.   Cognition: Cognition Overall Cognitive Status: Impaired/Different from baseline Arousal/Alertness: Lethargic Orientation Level: Oriented to person Attention: Sustained Sustained Attention: Impaired Cognition Arousal/Alertness: Awake/alert Behavior During Therapy: Flat affect Overall Cognitive Status: Impaired/Different from baseline Area of Impairment: Orientation, Attention, Memory, Following commands, Safety/judgement, Awareness, Problem solving Orientation Level: Disoriented to, Place, Time, Situation Current Attention Level: Focused Memory: Decreased short-term memory Following Commands: Follows one step commands inconsistently, Follows one step commands with  increased time Safety/Judgement: Decreased awareness of safety, Decreased awareness of deficits Awareness: Intellectual Problem Solving: Slow processing, Difficulty sequencing, Requires verbal cues, Decreased initiation, Requires tactile cues General Comments: Pt performed mobility with increased effort.  He has moments where he looses focus and requires increased time to rengage in activity.  Physical Exam: Blood pressure (!) 144/101, pulse 90, temperature 98.1 F (36.7 C), temperature source Oral, resp. rate 18, height 5\' 7"  (1.702 m), weight 105 kg, SpO2 100 %. Physical Exam  Vitals reviewed. Constitutional: He appears well-developed.  Morbidly obese  HENT:  Head: Normocephalic and atraumatic.  + NG  Eyes: Right eye exhibits no discharge. Left eye exhibits no discharge. No scleral icterus.  Neck: No tracheal deviation present. No thyromegaly present.  Respiratory: Effort normal. No respiratory distress.  GI: Soft.  Musculoskeletal:     Comments: Lower extremity edema  Neurological: He is alert.  Dysarthria Global aphasia Follows simple commands.   Patient weakness Motor: Limited by ability to follow commands, however moving  right side freely No movement noted in left upper extremity Left lower extremity moving hip and knees  Skin: Skin is warm and dry.  Psychiatric: He is slowed.  Unable to assess due to cognition    Results for orders placed or performed during the hospital encounter of 08/09/19 (from the past 48 hour(s))  Glucose, capillary     Status: Abnormal   Collection Time: 08/17/19  4:19 PM  Result Value Ref Range   Glucose-Capillary 168 (H) 70 - 99 mg/dL  Glucose, capillary     Status: Abnormal   Collection Time: 08/17/19  9:15 PM  Result Value Ref Range   Glucose-Capillary 150 (H) 70 - 99 mg/dL  Glucose, capillary     Status: Abnormal   Collection Time: 08/18/19 12:15 AM  Result Value Ref Range   Glucose-Capillary 138 (H) 70 - 99 mg/dL  CBC with  Differential/Platelet     Status: Abnormal   Collection Time: 08/18/19  3:29 AM  Result Value Ref Range   WBC 5.8 4.0 - 10.5 K/uL   RBC 3.67 (L) 4.22 - 5.81 MIL/uL   Hemoglobin 12.3 (L) 13.0 - 17.0 g/dL   HCT 36.3 (L) 39.0 - 52.0 %   MCV 98.9 80.0 - 100.0 fL   MCH 33.5 26.0 - 34.0 pg   MCHC 33.9 30.0 - 36.0 g/dL   RDW 11.9 11.5 - 15.5 %   Platelets 302 150 - 400 K/uL   nRBC 0.0 0.0 - 0.2 %   Neutrophils Relative % 61 %   Neutro Abs 3.5 1.7 - 7.7 K/uL   Lymphocytes Relative 27 %   Lymphs Abs 1.6 0.7 - 4.0 K/uL   Monocytes Relative 9 %   Monocytes Absolute 0.5 0.1 - 1.0 K/uL   Eosinophils Relative 2 %   Eosinophils Absolute 0.1 0.0 - 0.5 K/uL   Basophils Relative 0 %   Basophils Absolute 0.0 0.0 - 0.1 K/uL   Immature Granulocytes 1 %   Abs Immature Granulocytes 0.08 (H) 0.00 - 0.07 K/uL    Comment: Performed at [facility], [address], [place], [place] [id number]  Basic metabolic panel     Status: Abnormal   Collection Time: 08/18/19  3:29 AM  Result Value Ref Range   Sodium 139 135 - 145 mmol/L   Potassium 4.7 3.5 - 5.1 mmol/L   Chloride 101 98 - 111 mmol/L   CO2 27 22 - 32 mmol/L   Glucose, Bld 156 (H) 70 - 99 mg/dL   BUN 22 8 - 23 mg/dL   Creatinine, Ser 1.16 0.61 - 1.24 mg/dL   Calcium 9.5 8.9 - 10.3 mg/dL   GFR calc non Af Amer 59 (L) >60 mL/min   GFR calc Af Amer >60 >60 mL/min   Anion gap 11 5 - 15    Comment: Performed at [facility] [address], [place], [place] [address]  Magnesium     Status: None   Collection Time: 08/18/19  3:29 AM  Result Value Ref Range   Magnesium 1.9 1.7 - 2.4 mg/dL    Comment: Performed at [facility] [address] [place], [place] [address]  Glucose, capillary     Status: Abnormal   Collection Time: 08/18/19  3:38 AM  Result Value Ref Range   Glucose-Capillary 159 (H) 70 - 99 mg/dL  Glucose, capillary     Status: Abnormal   Collection Time: 08/18/19  8:25 AM  Result Value Ref Range  Glucose-Capillary 161 (H) 70 - 99 mg/dL  Glucose, capillary     Status: Abnormal   Collection Time: 08/18/19 12:06 PM  Result Value Ref Range   Glucose-Capillary 113 (H) 70 - 99 mg/dL  Glucose, capillary     Status: Abnormal   Collection Time: 08/18/19  4:51 PM  Result Value Ref Range   Glucose-Capillary 141 (H) 70 - 99 mg/dL  Glucose, capillary     Status: Abnormal   Collection Time: 08/18/19  7:18 PM  Result Value Ref Range   Glucose-Capillary 120 (H) 70 - 99 mg/dL   Comment 1 Notify RN    Comment 2 Document in Chart   Glucose, capillary     Status: Abnormal   Collection Time: 08/19/19 12:48 AM  Result Value Ref Range   Glucose-Capillary 174 (H) 70 - 99 mg/dL  Glucose, capillary     Status: Abnormal   Collection Time: 08/19/19  4:21 AM  Result Value Ref Range   Glucose-Capillary 139 (H) 70 - 99 mg/dL  Glucose, capillary     Status: Abnormal   Collection Time: 08/19/19  7:45 AM  Result Value Ref Range   Glucose-Capillary 152 (H) 70 - 99 mg/dL  Glucose, capillary     Status: Abnormal   Collection Time: 08/19/19 11:10 AM  Result Value Ref Range   Glucose-Capillary 145 (H) 70 - 99 mg/dL   No results found.     Medical Problem List and Plan: 1.  Left side weakness with dysarthria and dysphagia secondary to acute right MCA infarction  -patient may may shower  -ELOS/Goals: 20-25 days/Min/Mod A  Admit to CIR 2.  Antithrombotics: -DVT/anticoagulation: Lovenox  -antiplatelet therapy: Aspirin 81 mg daily, Plavix 75 mg daily x3 months then Plavix alone 3. Pain Management: Voltaren gel as needed 4. Mood: Provide emotional support  -antipsychotic agents: N/A 5. Neuropsych: This patient is not capable of making decisions on his own behalf. 6. Skin/Wound Care: Routine skin checks 7. Fluids/Electrolytes/Nutrition: Routine in and outs.  CMP ordered 8.  Post stroke dysphagia.     Nasogastric tube feeds.    Advance diet as tolerated 9.  Seizure disorder.  Continue Keppra 750 mg  every 12 hours, EEG negative 10.  Essential hypertension.  Lopressor 25 mg twice daily  Monitor with increased mobility 11.  Diet-controlled diabetes mellitus.  Hemoglobin A1c 6.2.  Monitoring with mobility 12.  CKD stage III.    CMP ordered 13.  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  Latest blood cultures question contaminant per infectious disease.  Ancef completed 08/19/2019.  Repeat blood culture no growth to date 14.  CAD with history of CABG 1997.  Continue aspirin and Plavix 15.  Hyperlipidemia: Continue Lipitor 16.  BPH.  Flomax 0.4 mg daily.    Check PVRs 17.  Morbid obesity.  BMI 35.70.  Dietary follow-up  Cathlyn Parsons, PA-C 08/19/2019  I have personally performed a face to face diagnostic evaluation, including, but not limited to relevant history and physical exam findings, of this patient and developed relevant assessment and plan.  Additionally, I have reviewed and concur with the physician assistant's documentation above.  Delice Lesch, MD, ABPMR  The patient's status has not changed. The original post admission physician evaluation remains appropriate, and any changes from the pre-admission screening or documentation from the acute chart are noted above.   Delice Lesch, MD, ABPMR

## 2019-08-19 NOTE — Progress Notes (Signed)
Pt was admitted to 4W03. Pt was oriented to the unit. Medications were reviewed, and Plan of care was discussed with pt. No complaints to report at this time. Amanda Cockayne, LPN

## 2019-08-19 NOTE — TOC Transition Note (Signed)
Transition of Care [facility]) - CM/SW Discharge Note   Patient Details  Name: ARTEEN MORAITIS MRN: [id number] Date of Birth: 15-Jan-1941  [facility]) CM/SW Contact:  Pollie Friar, RN Phone Number: 08/19/2019, 9:43 AM   Clinical Narrative:    Pt discharging to CIR today. CM signing off.    Final next level of care: IP Rehab Facility Barriers to Discharge: No Barriers Identified   Patient Goals and CMS Choice     Choice offered to / list presented to : Adult Children  Discharge Placement                       Discharge Plan and Services   Discharge Planning Services: CM Consult Post Acute Care Choice: IP Rehab                               Social Determinants of Health (SDOH) Interventions     Readmission Risk Interventions No flowsheet data found.

## 2019-08-19 NOTE — Progress Notes (Signed)
Physical Therapy Treatment Patient Details Name: Andres [name] MRN: [id number] DOB: 1940/08/28 Today's Date: 08/19/2019    History of Present Illness 79 yo male from home found laying on floor with incomprehensiable speech, L side weakness and not follow commands. Pt noted to be incontinent of urine.  R gaze preference and L side hemiparesis  CT (+) Hyperdense right middle cerebral artery distal M1 and proximal M2segments.PMH CAD with CABG 1997 DM HTn diverticulosis hiatal hernia head mass hepatitis lowe backpain mitral valve prolapse obese CVA hx smoking 29 years    PT Comments    Pt making improved progress this session will update recommendations back to CIR based on his current functional status.    Follow Up Recommendations  Supervision/Assistance - 24 hour;CIR(Pt more participatory this session will change recommendations back to CIR)     Equipment Recommendations  Other (comment)(TBA)    Recommendations for Other Services Rehab consult     Precautions / Restrictions Precautions Precautions: Fall Restrictions Weight Bearing Restrictions: No    Mobility  Bed Mobility Overal bed mobility: Needs Assistance Bed Mobility: Rolling Rolling: (Pt in R sidelying.) Sidelying to sit: Total assist       General bed mobility comments: Total assistance to mobilize to edge of bed.  He presents with involuntary myclonic jerking of LLE and keep transitioning it back to bed.  Total assistance to elevate trunk into a seated position.  He was able to manage sitting balance with use of RUE but at times required mod-max assistace to correct balance and move back to midline.  Transfers Overall transfer level: Needs assistance Equipment used: Ambulation equipment used(sara stedy) Transfers: Sit to/from Stand Sit to Stand: Mod assist         General transfer comment: Pt more alert but at times looses focus.  When focused he was able to achieve standing x2 bouts and maintain standing for  3-4 min.  Moderate assistance to weight shift forward and boost into a standing position.  Hand over hand on R to maintain hold to sara stedy.  Ambulation/Gait Ambulation/Gait assistance: (NT)               Stairs             Wheelchair Mobility    Modified Rankin (Stroke Patients Only)       Balance Overall balance assessment: Needs assistance   Sitting balance-Leahy Scale: Poor       Standing balance-Leahy Scale: Poor                              Cognition Arousal/Alertness: Awake/alert Behavior During Therapy: Flat affect Overall Cognitive Status: Impaired/Different from baseline Area of Impairment: Orientation;Attention;Memory;Following commands;Safety/judgement;Awareness;Problem solving                 Orientation Level: Disoriented to;Place;Time;Situation Current Attention Level: Focused Memory: Decreased short-term memory Following Commands: Follows one step commands inconsistently;Follows one step commands with increased time Safety/Judgement: Decreased awareness of safety;Decreased awareness of deficits Awareness: Intellectual Problem Solving: Slow processing;Difficulty sequencing;Requires verbal cues;Decreased initiation;Requires tactile cues General Comments: Pt performed mobility with increased effort.  He has moments where he looses focus and requires increased time to rengage in activity.      Exercises      General Comments        Pertinent Vitals/Pain Pain Assessment: Faces Pain Score: 0-No pain Faces Pain Scale: Hurts little more Pain Location: LUE Pain Descriptors / Indicators: Discomfort Pain  Intervention(s): Monitored during session;Repositioned    Home Living                      Prior Function            PT Goals (current goals can now be found in the care plan section) Acute Rehab PT Goals Patient Stated Goal: Unstated PT Goal Formulation: Patient unable to participate in goal  setting Potential to Achieve Goals: Fair Progress towards PT goals: Progressing toward goals    Frequency    Min 3X/week      PT Plan Discharge plan needs to be updated    Co-evaluation              AM-PAC PT "6 Clicks" Mobility   Outcome Measure  Help needed turning from your back to your side while in a flat bed without using bedrails?: Total Help needed moving from lying on your back to sitting on the side of a flat bed without using bedrails?: Total Help needed moving to and from a bed to a chair (including a wheelchair)?: Total Help needed standing up from a chair using your arms (e.g., wheelchair or bedside chair)?: Total Help needed to walk in hospital room?: Total Help needed climbing 3-5 steps with a railing? : Total 6 Click Score: 6    End of Session Equipment Utilized During Treatment: Gait belt Activity Tolerance: Patient limited by lethargy Patient left: in bed;with call bell/phone within reach;with bed alarm set(in chair position.) Nurse Communication: Mobility status PT Visit Diagnosis: Other abnormalities of gait and mobility (R26.89);Other symptoms and signs involving the nervous system (R29.898)     Time: [id number] PT Time Calculation (min) (ACUTE ONLY): 31 min  Charges:  $Therapeutic Activity: 23-37 mins                     Erasmo Leventhal , PTA Acute Rehabilitation Services Pager [phone] Office [phone]     Rashea Hoskie Eli Hose 08/19/2019, 11:01 AM

## 2019-08-19 NOTE — Progress Notes (Signed)
Andres Ribas, MD  Physician  Physical Medicine and Rehabilitation  Consult Note  Signed  Date of Service:  08/12/2019  5:17 AM      Related encounter: ED to Hosp-Admission (Current) from 08/09/2019 in [place] 3W [facility]      Signed      Expand AllCollapse All   Show:Clear all [x] Manual[x] Template[] Copied  Added by: [x] Angiulli, Lavon Paganini, PA-C[x] Raulkar, Clide Deutscher, MD  [] Hover for details          Physical Medicine and Rehabilitation Consult Reason for Consult: Left side weakness and slurred speech Referring Physician: [facility]     HPI: Andres [name] is a 79 y.o. 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  Per chart review patient lives with his son.  Reportedly independent prior to admission using a single-point cane.  Presented 08/08/2018 after being found down with left-sided weakness and slurred speech.  Cranial CT scan showed hyperdense right middle cerebral artery distal M1 and proximal M2 segments.  No acute hemorrhage.  Patient did not receive TPA.  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  Carotid and aortic atherosclerosis without stenosis.  MRI showed large area of acute infarction right MCA territory surrounding chronic infarct in the right parietal lobe.  Echocardiogram with ejection fraction of 60% without emboli.  EEG negative for seizure.  Admission chemistries with creatinine 1.35, SARS coronavirus negative, hemoglobin 13.8.  Neurology follow-up currently on aspirin and Plavix for CVA prophylaxis.  Patient is n.p.o. with alternative means of nutritional support.  Patient remains on [place] as prior to admission for history of seizure.   Therapy evaluation completed with recommendations of physical medicine rehab consult.     Review of Systems  Constitutional: Negative for chills and fever.  HENT: Negative for hearing loss.   Eyes: Negative for blurred vision and double vision.  Respiratory: Negative for cough and shortness of breath.   Cardiovascular: Negative for chest pain, palpitations and leg swelling.  Gastrointestinal: Positive for constipation. Negative for heartburn, nausea and vomiting.  Genitourinary: Positive for urgency. Negative for dysuria, flank pain and hematuria.  Musculoskeletal: Positive for back pain and myalgias.  Skin: Negative for rash.  Neurological: Positive for speech change, seizures and weakness.  All other systems reviewed and are negative.       Past Medical History:  Diagnosis Date  . ALLERGIC RHINITIS    . Benign prostatic hypertrophy    . 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 Last nuc 2012 negative. 07/21/2013  . Carpal tunnel syndrome, bilateral    . Cataracts, bilateral    . Coronary artery disease      Dr. Claiborne Billings; 2D ECHO, 12/11/2011 - EF 50-55%, normal; NUCLEAR STRESS TEST, 10/16/2010 - no evidemce of inducible ischemia  . Diabetes mellitus type II    . Diverticulosis of colon    . GERD (gastroesophageal reflux disease)    . H/O hiatal hernia    . Head mass    . Hepatitis    . Hx of   sessile serrated colonic polyp 02/27/2015  . Hyperlipidemia    . Hypertension    . Inguinal hernia, Rt reduced, Lt present 07/21/2013  . Low back pain    .  Mitral valve prolapse 11/24/2013    By echo dec 2014  . Nephrolithiasis    . Obesity, morbid ([place])    . Occlusion and stenosis of carotid artery without mention of cerebral infarction      CAROTID DOPPLER, 03/18/2012 - srable, mild, hard, plaque noted, bilaterally  . Stroke [facility])           Past Surgical History:  Procedure Laterality Date  . APPENDECTOMY      . CARDIAC CATHETERIZATION      . CATARACT EXTRACTION Right    .  CORONARY ARTERY BYPASS GRAFT      . INGUINAL HERNIA REPAIR Right 07/26/2013    Procedure: REPAIR  INCARCERATED RIGHT INGUINAL  HERNIA ;  Surgeon: Gwenyth Ober, MD;  Location: [facility];  Service: General;  Laterality: Right;         Family History  Problem Relation Age of Onset  . Heart attack Mother    . Diabetes Mother    . Heart attack Brother    . Heart attack Maternal Grandmother    . Hypertension Maternal Grandmother    . Pancreatic cancer Brother    . Kidney disease Brother    . Hypertension Son    . Kidney disease Son          ?  Marland Kitchen Alzheimer's disease Father    . Diabetes Brother    . Diabetes Sister      Social History:  reports that he quit smoking about 29 years ago. His smoking use included cigarettes. He has never used smokeless tobacco. He reports that he does not drink alcohol or use drugs. Allergies: No Known Allergies       Medications Prior to Admission  Medication Sig Dispense Refill  . Acetaminophen 500 MG coapsule Take 500 mg by mouth every 6 (six) hours as needed for mild pain. Reported on 07/21/2015      . Ascorbic Acid (VITAMIN C) 1000 MG tablet Take 1,000 mg by mouth daily.      . Aspirin (ADULT ASPIRIN LOW STRENGTH) 81 MG EC tablet Take 81 mg by mouth daily.        Marland Kitchen atorvastatin (LIPITOR) 40 MG tablet Take 1 tablet (40 mg total) by mouth daily. (Patient taking differently: Take 40 mg by mouth daily at 6 PM. ) 90 tablet 3  . B Complex Vitamins (VITAMIN B-COMPLEX PO) Take 1 tablet by mouth daily.      . clopidogrel (PLAVIX) 75 MG tablet Take 1 tablet (75 mg total) by mouth daily. 90 tablet 3  . hydrochlorothiazide (MICROZIDE) 12.5 MG capsule Take 1 capsule (12.5 mg total) by mouth daily. 90 capsule 3  . levETIRAcetam (KEPPRA) 500 MG tablet Take 1 tablet (500 mg total) by mouth 2 (two) times daily. 180 tablet 3  . losartan (COZAAR) 50 MG tablet Take 1 tablet (50 mg total) by mouth daily. 90 tablet 3  . meloxicam (MOBIC) 7.5 MG tablet Take 1 tablet (7.5 mg  total) by mouth daily. (Patient taking differently: Take 7.5 mg by mouth daily as needed for pain. ) 30 tablet 0  . metoprolol tartrate (LOPRESSOR) 50 MG tablet Take 1 tablet (50 mg total) by mouth 2 (two) times daily. 180 tablet 3  . Multiple Vitamin (MULTIVITAMIN) capsule Take 1 capsule by mouth daily.       . Omega-3 Fatty Acids (OMEGA 3 PO) Take 360 mg by mouth 2 (two) times daily.      . tamsulosin (FLOMAX) 0.4 MG  CAPS capsule Take 1 capsule (0.4 mg total) by mouth daily. 90 capsule 3  . VITAMIN D PO Take 1 tablet by mouth daily.      . diclofenac sodium (VOLTAREN) 1 % GEL Apply 2 g topically every 6 (six) hours as needed (for arthritis). (Patient not taking: Reported on [id number]) 123XX123 g 3  . folic acid (FOLVITE) 1 MG tablet Take 1 tablet (1 mg total) by mouth daily. (Patient not taking: Reported on 08/10/2019) 90 tablet 3      Home: [place] expects to be discharged to:: Private residence Living Arrangements: Children Available Help at Discharge: Family, Friend(s), Available PRN/intermittently Type of Home: [place] - single point Additional Comments: Per chart review, lives with his son.  Functional History: Prior Function Level of Independence: Independent Comments: walking in neighborhood to get to church Functional Status:  Mobility: Bed Mobility Overal bed mobility: Needs Assistance Bed Mobility: Rolling, Sidelying to Sit, Sit to Supine Rolling: Max assist Sidelying to sit: Max assist Sit to supine: Min assist General bed mobility comments: Max A to attend to left side and reach with RUE to bed rail. Max A to push into sitting and lower BLEs over EOB. Min A for return to supine as pt eager to return to bed Transfers Overall transfer level: Needs assistance Equipment used: (use of recliner backwards to pull up on) Transfers: Sit to/from Stand Sit to Stand: Mod assist, +2 physical assistance General transfer comment: Mod A +2 to power up  into standing using recliner back to pull up on. Good initiation    ADL: ADL Overall ADL's : Needs assistance/impaired Eating/Feeding: NPO Grooming: Moderate assistance Upper Body Bathing: Maximal assistance, Sitting, Bed level Lower Body Bathing: Maximal assistance, +2 for physical assistance, Sit to/from stand, Bed level Upper Body Dressing : Maximal assistance, Sitting, Bed level Lower Body Dressing: Maximal assistance, Sit to/from stand, Bed level Toilet Transfer: Maximal assistance, +2 for physical assistance(simulated at EOB) Functional mobility during ADLs: Moderate assistance, +2 for physical assistance(sit<>stand only) General ADL Comments: Pt presenting with decreased balance, strength, cognition, and functional use of LUE. Pt highly motivated to return to bed once sitting at EOB. Pt also having pulled off his condom cath and with urine in bed. Pt requiring Mod A +2 for sit<>stand with use of recliner (backwards) to pull inot standing.   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 Very motivated to lay back in bed and presenting with difficulty following cues for other tasks. Pt able to report he has a son and a daughter who both live in [place] as well as his age "67" (but not DOB).   Blood pressure (!) 171/93, pulse (!) 103, temperature 98.1 F (36.7 C), temperature source Oral, resp. rate 20, height 5\' 7"  (1.702 m), weight 104.2 kg, SpO2 100 %.  Physical Exam    General: Lethargic, opens eyes to verbal stimuli but quickly falls back asleep HEENT: Head is normocephalic, atraumatic, PERRLA, EOMI, sclera anicteric, oral mucosa pink and moist, dentition intact, ext ear canals clear,  Neck: Supple without JVD or lymphadenopathy Heart: Reg rate and rhythm. No murmurs rubs or gallops Chest: CTA bilaterally without wheezes, rales, or rhonchi; no distress GI: Soft, non-tender, non-distended, bowel sounds positive. Has Cortrak feeding tube Extremities: No clubbing, cyanosis, or edema. Pulses are 2+ Skin: Clean and intact without signs of breakdown Neurological/Musckuloskeletal: Lethargic, opens eyes to verbal stimuli but quickly falls back asleep. R sided gaze preference. Moving right arm and leg but not left.  Psych: Lethargic     Lab Results Last 24 Hours       Results for orders placed or performed during the hospital encounter of 08/09/19 (from the past 24 hour(s))  Glucose, capillary     Status: None    Collection Time: 08/11/19  7:44 AM  Result Value Ref Range    Glucose-Capillary 81 70 - 99 mg/dL  Glucose, capillary     Status: None    Collection Time: 08/11/19 12:38 PM  Result Value Ref Range    Glucose-Capillary 86 70 - 99 mg/dL    Comment 1 Notify RN      Comment 2 Document in Chart    Glucose, capillary     Status: None    Collection Time: 08/11/19  4:07 PM  Result Value Ref Range    Glucose-Capillary 86 70 - 99 mg/dL    Comment 1 Notify RN      Comment 2 Document in Chart      Glucose, capillary     Status: None    Collection Time: 08/11/19  7:49 PM  Result Value Ref Range    Glucose-Capillary 84 70 - 99 mg/dL  Glucose, capillary     Status: None    Collection Time: 08/12/19  4:50 AM  Result Value Ref Range    Glucose-Capillary 93 70 - 99 mg/dL    Comment 1 Notify RN      Comment 2 Document in Chart        Imaging Results (Last 48 hours)  No results found.       Assessment/Plan: Diagnosis: Acute R MCA infarct 1. Does the need for close, 24 hr/day medical supervision in concert with the patient's rehab needs make it unreasonable for this patient to be served in a less intensive setting? Yes 2. Co-Morbidities requiring supervision/potential complications: seizures, HTN, HLD, DM2, dysphagia, CAD s/p CABG, CKD, thrombocytopenia 3. Due to bladder management, bowel management, safety, skin/wound care, disease management, medication administration, pain management and patient education, does the patient require 24 hr/day rehab nursing? Yes 4. Does the patient require coordinated care of a physician, rehab nurse, therapy disciplines of PT, OT, SLP to address physical and functional deficits in the context of the above medical diagnosis(es)? Yes Addressing deficits in the following areas: balance, endurance, locomotion, strength, transferring, bowel/bladder control, bathing, dressing, feeding, grooming, toileting, cognition, speech, language, swallowing and psychosocial support 5. Can the patient actively participate in an intensive therapy program of at least 3 hrs of therapy per day at least 5 days per week? Yes 6. The potential for patient to make measurable gains while on inpatient rehab is excellent 7. Anticipated functional outcomes upon discharge from inpatient rehab are S with PT, S with OT, S with SLP. 8. Estimated rehab length of stay to reach the above functional  goals is: 2-3 weeks 9. Anticipated discharge destination: Home 10. Overall Rehab/Functional  Prognosis: excellent   RECOMMENDATIONS: This patient's condition is appropriate for continued rehabilitative care in the following setting: CIR Patient has agreed to participate in recommended program. N/A Note that insurance prior authorization may be required for reimbursement for recommended care.   Comment: Andres [name] has severe deficits due to his R MCA stroke and would benefit from CIR therapies. He lives with his son and uses a cane at baseline, able to ambulate in the community. Exam limited by lethargy this morning but performed    Cathlyn Parsons, PA-C 08/12/2019    I have personally performed a face to face diagnostic evaluation, including, but not limited to relevant history and physical exam findings, of this patient and developed relevant assessment and plan.  Additionally, I have reviewed and concur with the physician assistant's documentation above.   Leeroy Cha, MD        Revision History                     Routing History

## 2019-08-19 NOTE — Plan of Care (Signed)
  Problem: Self-Care: Goal: Ability to participate in self-care as condition permits will improve 08/19/2019 0658 by Asencion Partridge, RN Outcome: Progressing 08/19/2019 0657 by Asencion Partridge, RN Outcome: Progressing

## 2019-08-20 ENCOUNTER — Inpatient Hospital Stay (HOSPITAL_COMMUNITY): Payer: Medicare (Managed Care) | Admitting: Speech Pathology

## 2019-08-20 ENCOUNTER — Inpatient Hospital Stay (HOSPITAL_COMMUNITY): Payer: Medicare (Managed Care) | Admitting: Physical Therapy

## 2019-08-20 ENCOUNTER — Inpatient Hospital Stay (HOSPITAL_COMMUNITY): Payer: Medicare (Managed Care) | Admitting: Occupational Therapy

## 2019-08-20 DIAGNOSIS — E669 Obesity, unspecified: Secondary | ICD-10-CM

## 2019-08-20 DIAGNOSIS — N183 Chronic kidney disease, stage 3 unspecified: Secondary | ICD-10-CM

## 2019-08-20 DIAGNOSIS — I69391 Dysphagia following cerebral infarction: Secondary | ICD-10-CM

## 2019-08-20 DIAGNOSIS — R569 Unspecified convulsions: Secondary | ICD-10-CM

## 2019-08-20 DIAGNOSIS — N400 Enlarged prostate without lower urinary tract symptoms: Secondary | ICD-10-CM

## 2019-08-20 DIAGNOSIS — I63511 Cerebral infarction due to unspecified occlusion or stenosis of right middle cerebral artery: Secondary | ICD-10-CM

## 2019-08-20 DIAGNOSIS — E1169 Type 2 diabetes mellitus with other specified complication: Secondary | ICD-10-CM

## 2019-08-20 DIAGNOSIS — I10 Essential (primary) hypertension: Secondary | ICD-10-CM

## 2019-08-20 LAB — CULTURE, BLOOD (ROUTINE X 2)
Culture: NO GROWTH
Culture: NO GROWTH
Special Requests: ADEQUATE
Special Requests: ADEQUATE

## 2019-08-20 LAB — GLUCOSE, CAPILLARY
Glucose-Capillary: 109 mg/dL — ABNORMAL HIGH (ref 70–99)
Glucose-Capillary: 114 mg/dL — ABNORMAL HIGH (ref 70–99)
Glucose-Capillary: 143 mg/dL — ABNORMAL HIGH (ref 70–99)
Glucose-Capillary: 144 mg/dL — ABNORMAL HIGH (ref 70–99)
Glucose-Capillary: 152 mg/dL — ABNORMAL HIGH (ref 70–99)
Glucose-Capillary: 158 mg/dL — ABNORMAL HIGH (ref 70–99)

## 2019-08-20 MED ORDER — ENOXAPARIN SODIUM 40 MG/0.4ML ~~LOC~~ SOLN
40.0000 mg | Freq: Every day | SUBCUTANEOUS | Status: DC
  Administered 2019-08-20 – 2019-09-05 (×17): 40 mg via SUBCUTANEOUS
  Filled 2019-08-20 (×18): qty 0.4

## 2019-08-20 NOTE — Progress Notes (Signed)
Pt slept mostly throughout the night, pt does not follow commands and fights against repositioning and turning during diaper brief changes. Pt has right hand mitten in place, pt was trying to pull at cortrack.

## 2019-08-20 NOTE — Evaluation (Signed)
Occupational Therapy Assessment and Plan  Patient Details  Name: Andres Andres MRN: [id number] Date of Birth: [birth year]  OT Diagnosis: abnormal posture, apraxia, ataxia, cognitive deficits, flaccid hemiplegia and hemiparesis and muscle weakness (generalized) Rehab Potential: Rehab Potential (ACUTE ONLY): Fair ELOS: 3.5-4 weeks   Today's Date: 08/20/2019 OT Individual Time: 4193-7902 OT Individual Time Calculation (min): 60 min     Problem List:  Patient Active Problem List   Diagnosis Date Noted  . Diabetes mellitus type 2 in obese ([place])   . 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 Seizures ([place])   . Stage 3 chronic kidney disease   . Dysphagia, post-stroke   . Coagulase negative Staphylococcus bacteremia 08/15/2019  . Acute ischemic stroke ([place]) 08/09/2019  . Transaminitis 08/09/2019  . History of seizures 08/09/2019  . Left hip pain 03/02/2019  . Syncope 09/05/2018  . Gout 07/10/2018  . Scrotal swelling 06/09/2018  . Urinary dribbling 06/03/2017  . Mass of scrotum 06/03/2017  . Injury of left hand 07/21/2015  . Hx of   sessile serrated colonic polyp 02/27/2015  . Chronic anticoagulation 12/22/2014  . Colon cancer screening 12/22/2014  . Morbid obesity ([place]) 09/29/2014  . Mitral valve prolapse 11/24/2013  . IT band syndrome 09/23/2013  . Hypotension, unspecified 07/21/2013  . Dyspnea 07/21/2013  . Elevated troponin I level- (pk Troponin 0.34 in setting of acute renal insufficency) 07/21/2013  . 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 Last nuc 2012 negative. 07/21/2013  . SBO (small bowel obstruction) ([place]) 07/21/2013  . Inguinal hernia, Rt reduced, Lt present 07/21/2013  . Acute renal failure ([place]) 07/21/2013  . Pneumonia, aspiration ([place]) 07/21/2013  . Obesity (BMI 30-39.9) 01/17/2013  . Osteoarthritis, hand, primary localized 08/12/2012  . Preventative health care 04/11/2011  . MILD COGNITIVE IMPAIRMENT SO STATED 10/03/2010  .  CEREBROVASCULAR ACCIDENT, HX OF 10/03/2010  . RASH-NONVESICULAR 05/12/2008  . TRANSIENT ISCHEMIC ATTACK 01/27/2008  . Diabetes ([place]) 12/08/2007  . ALLERGIC RHINITIS 12/08/2007  . DIVERTICULOSIS, COLON 12/08/2007  . LOW BACK PAIN 12/08/2007  . NEPHROLITHIASIS, HX OF 12/08/2007  . BPH (benign prostatic hyperplasia) 04/05/2007  . Hyperlipidemia 04/02/2007  . CARPAL TUNNEL SYNDROME, BILATERAL 04/02/2007  . CATARACT NOS 04/02/2007  . Essential hypertension 04/02/2007    Past Medical History:  Past Medical History:  Diagnosis Date  . ALLERGIC RHINITIS   . Benign prostatic hypertrophy   . 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 Last nuc 2012 negative. 07/21/2013  . Carpal tunnel syndrome, bilateral   . Cataracts, bilateral   . Coronary artery disease    Dr. Claiborne Billings; 2D ECHO, 12/11/2011 - EF 50-55%, normal; NUCLEAR STRESS TEST, 10/16/2010 - no evidemce of inducible ischemia  . Diabetes mellitus type II   . Diverticulosis of colon   . GERD (gastroesophageal reflux disease)   . H/O hiatal hernia   . Head mass   . Hepatitis   . Hx of   sessile serrated colonic polyp 02/27/2015  . Hyperlipidemia   . Hypertension   . Inguinal hernia, Rt reduced, Lt present 07/21/2013  . Low back pain   . Mitral valve prolapse 11/24/2013   By echo dec 2014  . Nephrolithiasis   . Obesity, morbid ([place])   . Occlusion and stenosis of carotid artery without mention of cerebral infarction    CAROTID DOPPLER, 03/18/2012 - srable, mild, hard, plaque noted, bilaterally  . Stroke [facility])    Past Surgical History:  Past Surgical History:  Procedure Laterality Date  . APPENDECTOMY    . CARDIAC CATHETERIZATION    . CATARACT EXTRACTION Right   . CORONARY ARTERY BYPASS GRAFT    . INGUINAL HERNIA REPAIR Right 07/26/2013   Procedure: REPAIR  INCARCERATED RIGHT INGUINAL  HERNIA ;  Surgeon: Gwenyth Ober, MD;  Location: [place];  Service: General;  Laterality: Right;    Assessment & Plan Clinical Impression:   Andres Andres is an 79 year old right-handed male with history of hypertension, CKD stage III, morbid obesity with BMI 35.70, hyperlipidemia, history of seizure maintained on Keppra, CAD status post CABG, history of CVA maintained on aspirin 81 mg daily and Plavix, carotid artery stenosis, mitral valve prolapse, diet-controlled diabetes mellitus, BPH and remote tobacco abuse.  History taken from chart review and therapist due to cognition.  Patient lives with son.  Reportedly independent prior to admission using a single-point cane.  Presented on 08/09/2019 on the floor with left-sided weakness and slurred speech.  Cranial CT revealed hyperdense right MCA, distal M1 and proximal M2 segments.  No acute hemorrhage.  Patient did not receive TPA.  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  Carotid and aortic atherosclerosis without stenosis.  MRI showed large area of acute infarction right MCA territory surrounding chronic infarct in the right parietal lobe.  Echocardiogram with ejection fraction of 60% without emboli.  EEG negative for seizure.  Admission chemistries with creatinine 1.35, SARS coronavirus negative, hemoglobin 13.8.  Neurology follow-up currently remains on aspirin and Plavix therapy for CVA prophylaxis.  Subcutaneous Lovenox for DVT prophylaxis.  Keppra ongoing as prior to admission for history of seizure disorder.  Blood cultures did show coagulase-negative staph question contaminant and remained on cefazolin per infectious disease with repeat blood cultures with no growth to date and no plan for TEE with recommendations to complete Ancef 08/19/2019.  Patient currently remains n.p.o. with alternative means of nutritional support.  Therapy evaluations completed and patient was admitted for a comprehensive rehab program.  Please see preadmission assessment from earlier today as well.  Patient currently requires total with basic  self-care skills secondary to muscle weakness and muscle paralysis, decreased cardiorespiratoy endurance, impaired timing and sequencing, abnormal tone, unbalanced muscle activation, motor apraxia, ataxia, decreased coordination and decreased motor planning, decreased visual perceptual skills, decreased attention to left, left side neglect, decreased motor planning and ideational apraxia, decreased initiation, decreased attention, decreased awareness, decreased safety awareness, decreased memory and delayed processing and decreased sitting balance, decreased postural control and hemiplegia.  Prior to hospitalization, patient could complete BADLs with modified independent .  Patient will benefit from skilled intervention to increase independence with basic self-care skills prior to discharge home with son.  Anticipate patient will require moderate physical assestance and follow up home health or SNF.  OT - End of Session Endurance Deficit: Yes OT Assessment Rehab Potential (ACUTE ONLY): Fair OT Barriers to Discharge: Lack of/limited family support;Medical stability;Incontinence;Insurance for SNF coverage;Behavior OT Patient demonstrates impairments in the following area(s): Balance;Perception;Behavior;Safety;Cognition;Sensory;Endurance;Vision;Motor;Nutrition OT Basic ADL's Functional Problem(s): Grooming;Bathing;Dressing;Toileting OT Advanced ADL's Functional Problem(s): Simple Meal Preparation OT Transfers Functional Problem(s): Tub/Shower;Toilet OT Additional Impairment(s): Fuctional Use of Upper Extremity OT Plan OT Intensity: Minimum of 1-2 x/day, 45 to 90 minutes OT Frequency: 5 out of 7 days OT Duration/Estimated Length of Stay: 3.5-4 weeks OT Treatment/Interventions: Balance/vestibular training;Discharge planning;Functional electrical stimulation;Pain management;Self Care/advanced ADL retraining;Therapeutic Activities;UE/LE Coordination activities;Visual/perceptual  remediation/compensation;Therapeutic Exercise;Patient/family education;Functional mobility training;Disease mangement/prevention;Cognitive remediation/compensation;Community reintegration;DME/adaptive  equipment instruction;Neuromuscular re-education;Psychosocial support;Splinting/orthotics;UE/LE Strength taining/ROM;Wheelchair propulsion/positioning OT Self Feeding Anticipated Outcome(s): No goal OT Basic Self-Care Anticipated Outcome(s): Mod A OT Toileting Anticipated Outcome(s): Mod A OT Bathroom Transfers Anticipated Outcome(s): Mod A OT Recommendation Patient destination: Home Follow Up Recommendations: Home health OT;Skilled nursing facility Equipment Recommended: To be determined  Skilled Therapeutic Intervention Skilled OT session completed with focus on initial evaluation, education on OT role/POC, and establishment of patient-centered goals.   Pt greeted in bed, leaning strongly towards Lt side and low in bed. Per RN, pt is very restless/squirmy and repositions himself this way soon after staff repositions him for optimal alignment. He responded to all questions by shaking his head. No physical agitation noted, though he followed 1 step instruction <10% of the time, and during that time his response was significantly delayed. Total A+2 for supine<sit and Total A for sitting balance EOB due to posterior and Lt lean and he continued to rotate neck from side to side. Pt required [facility] for all bathing/dressing tasks. Note he kept both eyes barely open or closed Rt eye and opened Lt eye. Total cuing for visually attending to items handed to him, unable to initiate threading R UE into hospital gown. He would benefit from a more thorough visual assessment. Total A+2 for hygiene and brief change bedlevel. Note that pt required [facility] assist to release his Rt hand grip on the bedrail after given multiple verbal cues to release grip. L UE appeared flaccid with no active movement noted. Pt was boosted up in  bed with +2 assist, HOB elevated to 30 degrees for tube feed, Rt hand mitt donned, 4 bedrails up, call bell in lap, bed alarm set.    OT Evaluation Precautions/Restrictions  Precautions Precautions: Fall Precaution Comments: Lt hemi Vital Signs Therapy Vitals Pulse Rate: 79 BP: (!) 163/90 Pain Pain Assessment Pain Scale: 0-10 Pain Score: 0-No pain Home Living/Prior Functioning Home Living Available Help at Discharge: Family, Available 24 hours/day Type of Home: House Home Access: Stairs to enter Technical brewer of Steps: 2 Entrance Stairs-Rails: None Home Layout: One level Bathroom Shower/Tub: Tub/shower unit, Air cabin crew Accessibility: Yes Additional Comments: son live with pt pta  Lives With: Son Prior Function Level of Independence: Requires assistive device for independence(SPC)  Able to Take Stairs?: Yes Vocation: Retired ADL ADL Eating: Not assessed Grooming: Dependent Where Assessed-Grooming: Bed level Upper Body Bathing: Dependent Where Assessed-Upper Body Bathing: Edge of bed Lower Body Bathing: Dependent Where Assessed-Lower Body Bathing: Bed level Upper Body Dressing: Dependent Where Assessed-Upper Body Dressing: Edge of bed Lower Body Dressing: Dependent Where Assessed-Lower Body Dressing: Bed level Toileting: Not assessed Toilet Transfer: Not assessed Tub/Shower Transfer: Not assessed ADL Comments: Pt is dependent +2 assist during self care tasks excluding grooming (+1 assist) Vision Baseline Vision/History: Wears glasses Wears Glasses: At all times Patient Visual Report: Other (comment) Vision Assessment?: Vision impaired- to be further tested in functional context Eye Alignment: Impaired (comment) Ocular Range of Motion: Restricted on the left Alignment/Gaze Preference: Gaze right;Head turned Tracking/Visual Pursuits: Left eye does not track laterally;Right eye does not track medially Convergence: Impaired  - to be further tested in functional context Perception  Perception: Impaired Inattention/Neglect: Does not attend to left visual field;Does not attend to left side of body Praxis Praxis: Impaired Praxis Impairment Details: Ideation;Initiation;Ideomotor;Motor planning;Perseveration Cognition Orientation Level: (Unable to engage pt in orientation questioning due to dysarthria/limited speech during eval) Immediate Memory Recall: (Unable to complete due to cognition) Awareness: Impaired Problem Solving: Impaired  Behaviors: Impulsive Safety/Judgment: Impaired Comments: severe awareness and perceptual deficits well as impulsivity that limits safety Sensation Sensation Light Touch: Impaired Detail Light Touch Impaired Details: Impaired LUE;Impaired LLE Proprioception: Impaired by gross assessment;Impaired Detail Proprioception Impaired Details: Impaired LLE;Impaired LUE Coordination Gross Motor Movements are Fluid and Coordinated: No Fine Motor Movements are Fluid and Coordinated: No Coordination and Movement Description: severe ataxia on the L with alien arm persentation at times as well as myoclonic movement in BLE Finger Nose Finger Test: Unable due to cognition Heel Shin Test: unable due to cognition Motor  Motor Motor: Ataxia;Hemiplegia;Motor apraxia;Abnormal postural alignment and control;Motor perseverations;Motor impersistence Motor - Skilled Clinical Observations: hemiplegia with severe apraxia and ataxia on the L Mobility  Bed Mobility Bed Mobility: Rolling Right;Rolling Left;Supine to Sit;Sit to Supine Rolling Right: 2 Helpers Rolling Left: 2 Helpers Supine to Sit: Dependent - mechanical lift Sit to Supine: Dependent - mechanical lift Transfers Sit to Stand: 2 Helpers(mod assist once able to initiate movement with + 2 for safety.) Stand to Sit: 2 Helpers(max + 2 due to apraxia.)  Trunk/Postural Assessment  Cervical Assessment Cervical Assessment: Exceptions to WFL(gaze  and head turn to the R) Thoracic Assessment Thoracic Assessment: Exceptions to WFL(pushes to the L. rounded shoulders, poor activation of paraspinal and parascapular muscles on the L) Lumbar Assessment Lumbar Assessment: Exceptions to [facility] Postural Control Postural Control: Deficits on evaluation(Posterior bias + Lt lean sitting EOB during self care) Righting Reactions: absent Protective Responses: absent on the L Postural Limitations: pushes to the L in sitting  Balance Balance Balance Assessed: Yes Dynamic Sitting Balance Dynamic Sitting - Balance Support: Feet supported;During functional activity(Washing UB while EOB (HOH assist)) Dynamic Sitting - Level of Assistance: 1: +1 Total assist Extremity/Trunk Assessment RUE Assessment RUE Assessment: Exceptions to WFL(apraxic, with pt needing HOH to engage in all functional activity) LUE Assessment LUE Assessment: Exceptions to [facility] Strength Comments: Flaccid, flexor synergies developing in hand     Refer to Care Plan for Long Term Goals  Recommendations for other services: None    Discharge Criteria: Patient will be discharged from OT if patient refuses treatment 3 consecutive times without medical reason, if treatment goals not met, if there is a change in medical status, if patient makes no progress towards goals or if patient is discharged from hospital.  The above assessment, treatment plan, treatment alternatives and goals were discussed and mutually agreed upon: by patient  Skeet Simmer 08/20/2019, 12:52 PM

## 2019-08-20 NOTE — Evaluation (Signed)
Speech Language Pathology Assessment and Plan  Patient Details  Name: Andres [name] MRN: [id number] Date of Birth: 02-20-1941  SLP Diagnosis: Dysarthria;Cognitive Impairments;Dysphagia  Rehab Potential: Fair ELOS: 21-28 days    Today's Date: 08/20/2019 SLP Individual Time: 1305-1350 SLP Individual Time Calculation (min): 45 min   Problem List:  Patient Active Problem List   Diagnosis Date Noted  . Diabetes mellitus type 2 in obese ([place])   . 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 Seizures ([place])   . Stage 3 chronic kidney disease   . Dysphagia, post-stroke   . Coagulase negative Staphylococcus bacteremia 08/15/2019  . Acute ischemic stroke ([place]) 08/09/2019  . Transaminitis 08/09/2019  . History of seizures 08/09/2019  . Left hip pain 03/02/2019  . Syncope 09/05/2018  . Gout 07/10/2018  . Scrotal swelling 06/09/2018  . Urinary dribbling 06/03/2017  . Mass of scrotum 06/03/2017  . Injury of left hand 07/21/2015  . Hx of   sessile serrated colonic polyp 02/27/2015  . Chronic anticoagulation 12/22/2014  . Colon cancer screening 12/22/2014  . Morbid obesity ([place]) 09/29/2014  . Mitral valve prolapse 11/24/2013  . IT band syndrome 09/23/2013  . Hypotension, unspecified 07/21/2013  . Dyspnea 07/21/2013  . Elevated troponin I level- (pk Troponin 0.34 in setting of acute renal insufficency) 07/21/2013  . 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 Last nuc 2012 negative. 07/21/2013  . SBO (small bowel obstruction) ([place]) 07/21/2013  . Inguinal hernia, Rt reduced, Lt present 07/21/2013  . Acute renal failure ([place]) 07/21/2013  . Pneumonia, aspiration ([place]) 07/21/2013  . Obesity (BMI 30-39.9) 01/17/2013  . Osteoarthritis, hand, primary localized 08/12/2012  . Preventative health care 04/11/2011  . MILD COGNITIVE IMPAIRMENT SO STATED 10/03/2010  . CEREBROVASCULAR ACCIDENT, HX OF 10/03/2010  . RASH-NONVESICULAR 05/12/2008  . TRANSIENT ISCHEMIC ATTACK  01/27/2008  . Diabetes ([place]) 12/08/2007  . ALLERGIC RHINITIS 12/08/2007  . DIVERTICULOSIS, COLON 12/08/2007  . LOW BACK PAIN 12/08/2007  . NEPHROLITHIASIS, HX OF 12/08/2007  . BPH (benign prostatic hyperplasia) 04/05/2007  . Hyperlipidemia 04/02/2007  . CARPAL TUNNEL SYNDROME, BILATERAL 04/02/2007  . CATARACT NOS 04/02/2007  . Essential hypertension 04/02/2007   Past Medical History:  Past Medical History:  Diagnosis Date  . ALLERGIC RHINITIS   . Benign prostatic hypertrophy   . 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 Last nuc 2012 negative. 07/21/2013  . Carpal tunnel syndrome, bilateral   . Cataracts, bilateral   . Coronary artery disease    Dr. Claiborne Billings; 2D ECHO, 12/11/2011 - EF 50-55%, normal; NUCLEAR STRESS TEST, 10/16/2010 - no evidemce of inducible ischemia  . Diabetes mellitus type II   . Diverticulosis of colon   . GERD (gastroesophageal reflux disease)   . H/O hiatal hernia   . Head mass   . Hepatitis   . Hx of   sessile serrated colonic polyp 02/27/2015  . Hyperlipidemia   . Hypertension   . Inguinal hernia, Rt reduced, Lt present 07/21/2013  . Low back pain   . Mitral valve prolapse 11/24/2013   By echo dec 2014  . Nephrolithiasis   . Obesity, morbid ([place])   . Occlusion and stenosis of carotid artery without mention of cerebral infarction    CAROTID DOPPLER, 03/18/2012 - srable, mild, hard, plaque noted, bilaterally  . Stroke [facility])    Past Surgical History:  Past Surgical History:  Procedure Laterality Date  . APPENDECTOMY    . CARDIAC CATHETERIZATION    .  CATARACT EXTRACTION Right   . CORONARY ARTERY BYPASS GRAFT    . INGUINAL HERNIA REPAIR Right 07/26/2013   Procedure: REPAIR  INCARCERATED RIGHT INGUINAL  HERNIA ;  Surgeon: Gwenyth Ober, MD;  Location: [facility];  Service: General;  Laterality: Right;    Assessment / Plan / Recommendation Clinical Impression   Andres [name] is an 79 year old right-handed male with history of hypertension, CKD  stage III, morbid obesity with BMI 35.70, hyperlipidemia, history of seizure maintained on Keppra, CAD status post CABG, history of CVA maintained on aspirin 81 mg daily and Plavix, carotid artery stenosis, mitral valve prolapse, diet-controlled diabetes mellitus, BPH and remote tobacco abuse.  History taken from chart review and therapist due to cognition.  Patient lives with son.  Reportedly independent prior to admission using a single-point cane.  Presented on 08/09/2019 on the floor with left-sided weakness and slurred speech.  Cranial CT revealed hyperdense right MCA, distal M1 and proximal M2 segments.  No acute hemorrhage.  Patient did not receive TPA.  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  Carotid and aortic atherosclerosis without stenosis.  MRI showed large area of acute infarction right MCA territory surrounding chronic infarct in the right parietal lobe.  Echocardiogram with ejection fraction of 60% without emboli.  EEG negative for seizure.  Admission chemistries with creatinine 1.35, SARS coronavirus negative, hemoglobin 13.8.  Neurology follow-up currently remains on aspirin and Plavix therapy for CVA prophylaxis.  Subcutaneous Lovenox for DVT prophylaxis.  Keppra ongoing as prior to admission for history of seizure disorder.  Blood cultures did show coagulase-negative staph question contaminant and remained on cefazolin per infectious disease with repeat blood cultures with no growth to date and no plan for TEE with recommendations to complete Ancef 08/19/2019.  Patient currently remains n.p.o. with alternative means of nutritional support.  Therapy evaluations completed and patient was admitted for a comprehensive rehab program.  Please see preadmission assessment from earlier today as well. SLP evaluation completed on 08/20/2019 with the following results:  Bedside Swallow Evaluation:  Pt presents with significant lethargy  which precluded safe PO intake today.  Pt exhibited poor awareness of boluses due to alertness and decreased focused attention to any stimulation.  He made no attempts to clear purees or thin liquids from his lips nor did he make any indication that he was aware that boluses were there.  Without administration of boluses, pt had a wet, congested cough which could be indicative of decreased management of secretions; however it was difficult to assess his ability to manage his secretions further due to minimal vocalizations made during evaluation.  I would recommend that pt continue NPO with alternative means of nutrition until his mentation and lethargy improve.    Cognitive-linguistic evaluation: Pt presents with decreased alertness which impacted his participation in therapies during today's evaluation.  Pt would briefly and spontaneously awaken for ~30 second intervals but was otherwise unresponsive to therapist's attempts to improve alertness (sternal rub, trap squeeze, repositioning in bed, cold compress, oral care).  When awake, pt's speech was dysarthric due to imprecise articulation of consonants resulting in poor intelligibility at the word level. Currently, pt is totally dependent for all aspects of his care.     Skilled Therapeutic Interventions          Cognitive-linguistic evaluation completed with results and recommendations reviewed with family.     SLP Assessment  Patient will need skilled Speech  [facility] during CIR admission    Recommendations  SLP Diet Recommendations: NPO;Alternative means - temporary Medication Administration: Via alternative means Postural Changes and/or Swallow Maneuvers: Seated upright 90 degrees Oral Care Recommendations: Oral care QID Patient destination: [facility] (SNF) Follow up Recommendations: Skilled Nursing facility;24 hour supervision/assistance Equipment Recommended: None recommended by SLP    SLP Frequency 3 to 5  out of 7 days   SLP Duration  SLP Intensity  SLP Treatment/Interventions 21-28 days  Minumum of 1-2 x/day, 30 to 90 minutes  Cognitive remediation/compensation;Cueing hierarchy;Functional tasks;Patient/family education;Environmental controls;Dysphagia/aspiration precaution training;Internal/external aids    Pain Pain Assessment Pain Scale: 0-10 Pain Score: 0-No pain  Prior Functioning Cognitive/Linguistic Baseline: Within functional limits Type of Home: House  Lives With: Son Available Help at Discharge: Family;Available 24 hours/day Vocation: Retired  Programmer, systems Overall Cognitive Status: Impaired/Different from baseline Arousal/Alertness: Lethargic Orientation Level: Oriented to person Attention: Focused Focused Attention: Impaired Focused Attention Impairment: Verbal basic;Functional basic Memory: Impaired Memory Impairment: Storage deficit Immediate Memory Recall: (Unable to complete due to cognition) Awareness: Impaired Awareness Impairment: Intellectual impairment Problem Solving: Impaired Problem Solving Impairment: Verbal basic;Functional basic Behaviors: Restless Safety/Judgment: Impaired Comments: severe awareness and perceptual deficits well as impulsivity that limits safety  Comprehension Auditory Comprehension Overall Auditory Comprehension: Appears within functional limits for tasks assessed(impacted by cognition) Expression Expression Primary Mode of Expression: Verbal Verbal Expression Overall Verbal Expression: Appears within functional limits for tasks assessed(impacted by cognition) Written Expression Dominant Hand: Right Oral Motor Oral Motor/Sensory Function Overall Oral Motor/Sensory Function: Moderate impairment Facial ROM: Suspected CN VII (facial) dysfunction Facial Symmetry: Abnormal symmetry left;Suspected CN VII (facial) dysfunction Facial Sensation: Suspected CN V (Trigeminal) dysfunction Lingual ROM: Other  (Comment) Lingual Symmetry: Other (Comment) Motor Speech Overall Motor Speech: Impaired Phonation: Low vocal intensity Articulation: Impaired Level of Impairment: Word Intelligibility: Intelligibility reduced Word: 0-24% accurate  Bedside Swallowing Assessment General Date of Onset: 08/09/19 Previous Swallow Assessment: BSE 08/10/2019 Diet Prior to this Study: NPO Temperature Spikes Noted: No Respiratory Status: Room air History of Recent Intubation: No Behavior/Cognition: Lethargic/Drowsy;Doesn't follow directions Oral Cavity - Dentition: Missing dentition Self-Feeding Abilities: Total assist Patient Positioning: Upright in bed Baseline Vocal Quality: Low vocal intensity;Wet Volitional Cough: Cognitively unable to elicit Volitional Swallow: Unable to elicit  Oral Care Assessment   Ice Chips   Thin Liquid   Nectar Thick   Honey Thick   Puree Puree: Impaired Oral Phase Impairments: Poor awareness of bolus Other Comments: no attempts to orally accept bolus applesauce when placed to pt's lips Solid   BSE Assessment Risk for Aspiration Impact on safety and function: Severe aspiration risk Other Related Risk Factors: Lethargy;Cognitive impairment;Deconditioning  Short Term Goals: Week 1: SLP Short Term Goal 1 (Week 1): Pt will consume therapeutic trials of thin liquids, ice chips, or purees with max assist multimodal cues for use of swallowing precautions and minimal overt s/s of aspiratin over 3 consecutive sessions prior to instrumental assessment. SLP Short Term Goal 2 (Week 1): Pt will focus his attention to stimuli in >25% of opportunities with max assist multimodal cues. SLP Short Term Goal 3 (Week 1): Pt will remain alert for >1 minute with max assist multimodal cues. SLP Short Term Goal 4 (Week 1): Pt will orient to place, date, and situation with max assist multimodal cues. SLP Short Term Goal 5 (Week 1): Pt will visually scan to the left of midline in >25% of  opportunities during basic structured tasks with max assist multimodal cues.  Refer to Care Plan for Long Term Goals  Recommendations for other services: None   Discharge Criteria: Patient will be discharged from SLP if patient refuses treatment 3 consecutive times without medical reason, if treatment goals not met, if there is a change in medical status, if patient makes no progress towards goals or if patient is discharged from hospital.  The above assessment, treatment plan, treatment alternatives and goals were discussed and mutually agreed upon: by family  Emilio Math 08/20/2019, 2:32 PM

## 2019-08-20 NOTE — Evaluation (Signed)
Physical Therapy Assessment and Plan  Patient Details  Name: Andres [name] MRN: [id number] Date of Birth: 04/06/1941  PT Diagnosis: Abnormal posture, Abnormality of gait, Ataxia, Ataxic gait, Cognitive deficits, Coordination disorder, Hemiplegia non-dominant, Impaired sensation and Muscle weakness Rehab Potential: Poor ELOS: 4-5 weeks   Today's Date: 08/20/2019 PT Individual Time: 1000-1110 PT Individual Time Calculation (min): 70 min    Problem List:  Patient Active Problem List   Diagnosis Date Noted  . Diabetes mellitus type 2 in obese (HCC)   . 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 Seizures (HCC)   . Stage 3 chronic kidney disease   . Dysphagia, post-stroke   . Coagulase negative Staphylococcus bacteremia 08/15/2019  . Acute ischemic stroke (HCC) 08/09/2019  . Transaminitis 08/09/2019  . History of seizures 08/09/2019  . Left hip pain 03/02/2019  . Syncope 09/05/2018  . Gout 07/10/2018  . Scrotal swelling 06/09/2018  . Urinary dribbling 06/03/2017  . Mass of scrotum 06/03/2017  . Injury of left hand 07/21/2015  . Hx of   sessile serrated colonic polyp 02/27/2015  . Chronic anticoagulation 12/22/2014  . Colon cancer screening 12/22/2014  . Morbid obesity (HCC) 09/29/2014  . Mitral valve prolapse 11/24/2013  . IT band syndrome 09/23/2013  . Hypotension, unspecified 07/21/2013  . Dyspnea 07/21/2013  . Elevated troponin I level- (pk Troponin 0.34 in setting of acute renal insufficency) 07/21/2013  . 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 Last nuc 2012 negative. 07/21/2013  . SBO (small bowel obstruction) (HCC) 07/21/2013  . Inguinal hernia, Rt reduced, Lt present 07/21/2013  . Acute renal failure (HCC) 07/21/2013  . Pneumonia, aspiration (HCC) 07/21/2013  . Obesity (BMI 30-39.9) 01/17/2013  . Osteoarthritis, hand, primary localized 08/12/2012  . Preventative health care 04/11/2011  . MILD COGNITIVE IMPAIRMENT SO STATED 10/03/2010  .  CEREBROVASCULAR ACCIDENT, HX OF 10/03/2010  . RASH-NONVESICULAR 05/12/2008  . TRANSIENT ISCHEMIC ATTACK 01/27/2008  . Diabetes (HCC) 12/08/2007  . ALLERGIC RHINITIS 12/08/2007  . DIVERTICULOSIS, COLON 12/08/2007  . LOW BACK PAIN 12/08/2007  . NEPHROLITHIASIS, HX OF 12/08/2007  . BPH (benign prostatic hyperplasia) 04/05/2007  . Hyperlipidemia 04/02/2007  . CARPAL TUNNEL SYNDROME, BILATERAL 04/02/2007  . CATARACT NOS 04/02/2007  . Essential hypertension 04/02/2007    Past Medical History:  Past Medical History:  Diagnosis Date  . ALLERGIC RHINITIS   . Benign prostatic hypertrophy   . 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 Last nuc 2012 negative. 07/21/2013  . Carpal tunnel syndrome, bilateral   . Cataracts, bilateral   . Coronary artery disease    Dr. Kelly; 2D ECHO, 12/11/2011 - EF 50-55%, normal; NUCLEAR STRESS TEST, 10/16/2010 - no evidemce of inducible ischemia  . Diabetes mellitus type II   . Diverticulosis of colon   . GERD (gastroesophageal reflux disease)   . H/O hiatal hernia   . Head mass   . Hepatitis   . Hx of   sessile serrated colonic polyp 02/27/2015  . Hyperlipidemia   . Hypertension   . Inguinal hernia, Rt reduced, Lt present 07/21/2013  . Low back pain   . Mitral valve prolapse 11/24/2013   By echo dec 2014  . Nephrolithiasis   . Obesity, morbid (HCC)   . Occlusion and stenosis of carotid artery without mention of cerebral infarction    CAROTID DOPPLER, 03/18/2012 - srable, mild, hard, plaque noted, bilaterally  . Stroke (HCC)    Past Surgical History:  Past Surgical History:    Procedure Laterality Date  . APPENDECTOMY    . CARDIAC CATHETERIZATION    . CATARACT EXTRACTION Right   . CORONARY ARTERY BYPASS GRAFT    . INGUINAL HERNIA REPAIR Right 07/26/2013   Procedure: REPAIR  INCARCERATED RIGHT INGUINAL  HERNIA ;  Surgeon: James O Wyatt, MD;  Location: [facility] OR;  Service: General;  Laterality: Right;    Assessment & Plan Clinical Impression:  Patient is a 79-year-old right-handed male with history of hypertension, CKD stage III, morbid obesity with BMI 35.70, hyperlipidemia, history of seizure maintained on Keppra, CAD status post CABG, history of CVA maintained on aspirin 81 mg daily and Plavix, carotid artery stenosis, mitral valve prolapse, diet-controlled diabetes mellitus, BPH and remote tobacco abuse.  History taken from chart review and therapist due to cognition.  Patient lives with son.  Reportedly independent prior to admission using a single-point cane.  Presented on 08/09/2019 on the floor with left-sided weakness and slurred speech.  Cranial CT revealed hyperdense right MCA, distal M1 and proximal M2 segments.  No acute hemorrhage.  Patient did not receive TPA.  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  Carotid and aortic atherosclerosis without stenosis.  MRI showed large area of acute infarction right MCA territory surrounding chronic infarct in the right parietal lobe.  Echocardiogram with ejection fraction of 60% without emboli.  EEG negative for seizure.  Admission chemistries with creatinine 1.35, SARS coronavirus negative, hemoglobin 13.8.  Neurology follow-up currently remains on aspirin and Plavix therapy for CVA prophylaxis.  Subcutaneous Lovenox for DVT prophylaxis.  Keppra ongoing as prior to admission for history of seizure disorder.  Blood cultures did show coagulase-negative staph question contaminant and remained on cefazolin per infectious disease with repeat blood cultures with no growth to date and no plan for TEE with recommendations to complete Ancef 08/19/2019.  Patient currently remains n.p.o. with alternative means of nutritional support.   Patient transferred to CIR on 08/19/2019 .   Patient currently requires total with mobility secondary to muscle weakness, muscle joint tightness and muscle paralysis, decreased cardiorespiratoy endurance, impaired  timing and sequencing, unbalanced muscle activation, motor apraxia, ataxia, decreased coordination and decreased motor planning, decreased visual acuity, decreased visual perceptual skills, decreased visual motor skills, field cut and hemianopsia, decreased midline orientation, decreased attention to left, left side neglect and ideational apraxia, decreased initiation, decreased attention, decreased awareness, decreased problem solving, decreased safety awareness, decreased memory and delayed processing and decreased sitting balance, decreased standing balance, decreased postural control, hemiplegia and decreased balance strategies.  Prior to hospitalization, patient was modified independent  with mobility and lived with Son in a House home.  Home access is 2Stairs to enter.  Patient will benefit from skilled PT intervention to maximize safe functional mobility, minimize fall risk and decrease caregiver burden for planned discharge to SNF.  Anticipate patient will benefit from SNF placement  at discharge.  PT - End of Session Activity Tolerance: Tolerates < 10 min activity, no significant change in vital signs Endurance Deficit: Yes PT Assessment Rehab Potential (ACUTE/IP ONLY): Poor PT Barriers to Discharge: Inaccessible home environment;Decreased caregiver support;Medical stability;Home environment access/layout;Incontinence;Wound Care;Insurance for SNF coverage;Weight;Medication compliance;Behavior PT Patient demonstrates impairments in the following area(s): Balance;Behavior;Edema;Endurance;Motor;Nutrition;Pain;Perception;Sensory;Safety;Skin Integrity PT Transfers Functional Problem(s): Bed Mobility;Bed to Chair;Car;Furniture;Floor PT Locomotion Functional Problem(s): Ambulation;Wheelchair Mobility;Stairs PT Plan PT Intensity: Minimum of 1-2 x/day ,45 to 90 minutes PT Frequency: 5 out of 7 days PT Duration Estimated Length of Stay:   4-5 weeks PT Treatment/Interventions: Ambulation/gait  training;Discharge planning;Functional mobility training;Psychosocial support;Visual/perceptual remediation/compensation;Therapeutic Activities;Wheelchair propulsion/positioning;Therapeutic Exercise;Skin care/wound management;Neuromuscular re-education;Disease management/prevention;Balance/vestibular training;Cognitive remediation/compensation;DME/adaptive equipment instruction;Pain management;UE/LE Strength taining/ROM;UE/LE Coordination activities;Splinting/orthotics;Stair training;Patient/family education;Functional electrical stimulation;Community reintegration PT Transfers Anticipated Outcome(s): Mod assist with SB or squat pivot to access WC PT Locomotion Anticipated Outcome(s): Min-Mod assist WC level. PT Recommendation Follow Up Recommendations: Skilled nursing facility Patient destination: Skilled Nursing Facility (SNF) Equipment Recommended: To be determined  Skilled Therapeutic Intervention  Pt received supine in bed and agreeable to PT. PT instructed patient in PT Evaluation and initiated treatment intervention; see below for results. PT educated patient in POC, rehab potential, rehab goals, and discharge recommendations. PT obtained TIS WCUnable to roll R and L with max assist of 1 person due to apraxia, ataxia, pushers syndrome, and hemiplegia. +2 assist for rolling r and L to place maxi move sling. Pt transported to rehab gym in WC. Sit<>stand with mod-max assist + 2 once pt able to initiate movement to stand after 1 min of processing. Mod-max assist standing balance with L knee blocked as listed below. Pt with noted apraxia with attempting to return to sitting, requiring max assist after ~1 minute in standing to initate return to WC. Patient returned to room and left sitting in WC with alaem belt in place, daughter present, call bell in reach, and all needs met.     PT Evaluation Precautions/Restrictions Precautions Precautions: Fall Precaution Comments: Lt hemi General   Vital  SignsTherapy Vitals Pulse Rate: 79 BP: (!) 163/90 Pain Pain Assessment Pain Scale: 0-10 Pain Score: 0-No pain Home Living/Prior Functioning Home Living Available Help at Discharge: Family;Available 24 hours/day Type of Home: House Home Access: Stairs to enter Entrance Stairs-Number of Steps: 2 Entrance Stairs-Rails: None Home Layout: One level Bathroom Shower/Tub: Tub/shower unit;Curtain Bathroom Toilet: Standard Bathroom Accessibility: Yes Additional Comments: son live with pt pta  Lives With: Son Prior Function Level of Independence: Requires assistive device for independence(SPC)  Able to Take Stairs?: Yes Vocation: Retired Vision/Perception  Vision - Assessment Eye Alignment: Impaired (comment) Ocular Range of Motion: Restricted on the left Alignment/Gaze Preference: Gaze right;Head turned Tracking/Visual Pursuits: Left eye does not track laterally;Right eye does not track medially Convergence: Impaired - to be further tested in functional context Perception Perception: Impaired Inattention/Neglect: Does not attend to left visual field;Does not attend to left side of body Praxis Praxis: Impaired Praxis Impairment Details: Ideation;Initiation;Ideomotor;Motor planning;Perseveration  Cognition Orientation Level: Oriented to person;Disoriented to place;Disoriented to time;Disoriented to situation Awareness: Impaired Problem Solving: Impaired Behaviors: Impulsive Safety/Judgment: Impaired Comments: severe awareness and perceptual deficits well as impulsivity that limits safety Sensation Sensation Light Touch: Impaired Detail Light Touch Impaired Details: Impaired LUE;Impaired LLE Proprioception: Impaired by gross assessment;Impaired Detail Proprioception Impaired Details: Impaired LLE;Impaired LUE Coordination Gross Motor Movements are Fluid and Coordinated: No Fine Motor Movements are Fluid and Coordinated: No Coordination and Movement Description: severe ataxia  on the L with alien arm persentation at times as well as myoclonic movement in BLE Heel Shin Test: unable due to cognition Motor  Motor Motor: Ataxia;Hemiplegia;Motor apraxia;Abnormal postural alignment and control;Motor perseverations;Motor impersistence Motor - Skilled Clinical Observations: hemiplegia with severe apraxia and ataxia on the L  Mobility Bed Mobility Bed Mobility: Rolling Right;Rolling Left;Supine to Sit;Sit to Supine Rolling Right: 2 Helpers Rolling Left: 2 Helpers Supine to Sit: Dependent - mechanical lift Sit to Supine: Dependent - mechanical lift Transfers Transfers: Sit to Stand;Stand to Sit;Transfer via Lift Equipment Sit to Stand: 2 Helpers(mod assist once able to initiate   movement with + 2 for safety.) Stand to Sit: 2 Helpers(max + 2 due to apraxia.) Transfer via Lift Equipment: Occupational psychologist Ambulation: No Gait Gait: No Stairs / Additional Locomotion Stairs: No Architect: Yes Wheelchair Assistance: Dependent - Patient 0% Wheelchair Parts Management: Needs assistance Distance: TIS WC throughout unit  Trunk/Postural Assessment  Cervical Assessment Cervical Assessment: Exceptions to WFL(gaze and head turn to the R) Thoracic Assessment Thoracic Assessment: Exceptions to WFL(pushes to the L. rounded shoulders, poor activation of paraspinal and parascapular muscles on the L) Lumbar Assessment Lumbar Assessment: Exceptions to [facility] Postural Control Postural Control: Deficits on evaluation Righting Reactions: absent Protective Responses: absent on the L Postural Limitations: pushes to the L in sitting  Balance Balance Balance Assessed: Yes Static Sitting Balance Static Sitting - Level of Assistance: 1: +2 Total assist Static Sitting - Comment/# of Minutes: posterior/L pushing Static Standing Balance Static Standing - Level of Assistance: 1: +2 Total assist;2: Max assist Static Standing - Comment/# of Minutes: L  knee blocked in standing in parallel bars. Extremity Assessment      RLE Assessment RLE Assessment: Exceptions to [facility] General Strength Comments: apraxic, but grossly 4/5 when activated. Marland Kitchen LLE Assessment LLE Assessment: Exceptions to [facility] General Strength Comments: apraxic, but grossly 4-/5 when activated. Marland Kitchen    Refer to Care Plan for Long Term Goals  Recommendations for other services: None   Discharge Criteria: Patient will be discharged from PT if patient refuses treatment 3 consecutive times without medical reason, if treatment goals not met, if there is a change in medical status, if patient makes no progress towards goals or if patient is discharged from hospital.  The above assessment, treatment plan, treatment alternatives and goals were discussed and mutually agreed upon: by patient and by family  Lorie Phenix 08/20/2019, 12:44 PM

## 2019-08-20 NOTE — Progress Notes (Signed)
[facility] PHYSICAL MEDICINE & REHABILITATION PROGRESS NOTE  Subjective/Complaints: Patient seen laying in bed this morning.  Brief unintelligible verbal output.  ROS: Unable to assess due to cognition/language.  Objective: Vital Signs: Blood pressure (!) 163/90, pulse 79, temperature 98.1 F (36.7 C), resp. rate 18, weight 100 kg, SpO2 100 %. No results found. Recent Labs    08/18/19 0329  WBC 5.8  HGB 12.3*  HCT 36.3*  PLT 302   Recent Labs    08/18/19 0329  NA 139  K 4.7  CL 101  CO2 27  GLUCOSE 156*  BUN 22  CREATININE 1.16  CALCIUM 9.5    Physical Exam: BP (!) 163/90   Pulse 79   Temp 98.1 F (36.7 C)   Resp 18   Wt 100 kg   SpO2 100%   BMI 34.53 kg/m  Constitutional: No distress . Vital signs reviewed.  Morbidly obese. HENT: Normocephalic.  Atraumatic.  + NG. Eyes: EOMI. No discharge. Cardiovascular: No JVD. Respiratory: Normal effort.  No stridor. GI: Non-distended. Skin: Warm and dry.  Intact. Psych: Unable to assess due to mentation Musc: Lower extremity edema. Neurological: Alert. Dysarthria Global aphasia Patient weakness Motor: Moving right side freely No movement noted in left upper extremity  Assessment/Plan: 1. Functional deficits secondary to right MCA infarct which require 3+ hours per day of interdisciplinary therapy in a comprehensive inpatient rehab setting.  Physiatrist is providing close team supervision and 24 hour management of active medical problems listed below.  Physiatrist and rehab team continue to assess barriers to discharge/monitor patient progress toward functional and medical goals  Care Tool:  Bathing        Body parts bathed by helper: Right arm, Abdomen, Chest, Left arm, Front perineal area, Buttocks, Right upper leg, Left upper leg, Right lower leg, Left lower leg, Face     Bathing assist Assist Level: 2 Helpers     Upper Body Dressing/Undressing Upper body dressing   What is the patient wearing?:  Hospital gown only    Upper body assist Assist Level: Total Assistance - Patient < 25%    Lower Body Dressing/Undressing Lower body dressing      What is the patient wearing?: Incontinence brief     Lower body assist Assist for lower body dressing: 2 Helpers     Toileting Toileting    Toileting assist Assist for toileting: 2 Helpers     Transfers Chair/bed transfer  Transfers assist           Locomotion Ambulation   Ambulation assist              Walk 10 feet activity   Assist           Walk 50 feet activity   Assist           Walk 150 feet activity   Assist           Walk 10 feet on uneven surface  activity   Assist           Wheelchair     Assist               Wheelchair 50 feet with 2 turns activity    Assist            Wheelchair 150 feet activity     Assist            Medical Problem List and Plan: 1.  Left side weakness with dysarthria and dysphagia secondary to  acute right MCA infarction  Begin CIR evaluations 2.  Antithrombotics: -DVT/anticoagulation: Lovenox             -antiplatelet therapy: Aspirin 81 mg daily, Plavix 75 mg daily x3 months then Plavix alone 3. Pain Management: Voltaren gel as needed 4. Mood: Provide emotional support             -antipsychotic agents: N/A 5. Neuropsych: This patient is not capable of making decisions on his own behalf. 6. Skin/Wound Care: Routine skin checks 7. Fluids/Electrolytes/Nutrition: Routine in and outs.    CMP ordered. 8.  Post stroke dysphagia.        N.p.o.             Nasogastric tube feeds.               Advance diet as tolerated 9.  Seizure disorder.  Continue Keppra 750 mg every 12 hours, EEG negative  No seizures since admission 10.  Essential hypertension.  Lopressor 25 mg twice daily  Monitor with increased mobility 11.  Diet-controlled diabetes mellitus in obese.  Hemoglobin A1c 6.2.             Monitor with increased  mobility 12.  CKD stage III.               CMP ordered for Monday 13.  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  Latest blood cultures question contaminant per infectious disease.  Ancef completed 08/19/2019.             Repeat blood culture no growth 14.  CAD with history of CABG 1997.  Continue aspirin and Plavix 15.  Hyperlipidemia: Continue Lipitor 16.  BPH.  Flomax 0.4 mg daily.               PVRs ordered 17.  Morbid obesity.  BMI 35.70.  Dietary follow-up  LOS: 1 days A FACE TO FACE EVALUATION WAS PERFORMED  Wilfrid Hyser Lorie Phenix 08/20/2019, 9:55 AM

## 2019-08-20 NOTE — Progress Notes (Signed)
Initial Nutrition Assessment  DOCUMENTATION CODES:   Obesity unspecified  INTERVENTION:   ContinueJevity 1.2via Cortrak NGT at goal rate of 40ml/hr.   53ml Prostat once daily.   Tube feeding regimen provides1972kcal (100% of needs),101grams of protein, and 128ml of H2O.   NUTRITION DIAGNOSIS:   Inadequate oral intake related to inability to eat, lethargy/confusion as evidenced by NPO status.  GOAL:   Patient will meet greater than or equal to 90% of their needs  MONITOR:   TF tolerance, Labs, Weight trends, I & O's  REASON FOR ASSESSMENT:   Consult Enteral/tube feeding initiation and management  ASSESSMENT:   79 year old right-handed male with history of hypertension, CKD stage III, morbid obesity with BMI 35.70, hyperlipidemia, history of seizure maintained on Keppra, CAD status post CABG, history of CVA maintained on aspirin 81 mg daily and Plavix, carotid artery stenosis, mitral valve prolapse, diet-controlled diabetes mellitus, BPH and remote tobacco abuse. Admitted to rehab, s/p acute rt MCA infarct surroudning of R MCA parietal infarct, d/t right M1 occulusion.  1/6: Admitted, deemed not safe for POs per SLP given neurogenic dysphagia and poor mentation 1/8: Cortrak tube placed 1/11: Cortrak became clogged 1/12: Cortrak unclogged 1/15: transferred to rehab  **RD working remotely**  Patient continues to be alert/oriented to self only. Pt has been attempting to pull at Cortrak tube. Otherwise, pt is tolerating TF via Cortrak and TF regimen was resumed once pt was transferred to rehab.   Weight on 1/6: 229 lbs. Current weight: 220 lbs.  Medications reviewed. Labs reviewed: CBGs: 114-143  NUTRITION - FOCUSED PHYSICAL EXAM:  Working remotely.  Diet Order:   Diet Order            Diet NPO time specified  Diet effective now              EDUCATION NEEDS:   No education needs have been identified at this time  Skin:  Skin Assessment:  Reviewed RN Assessment  Last BM:  1/15 -type 5  Height:   Ht Readings from Last 1 Encounters:  08/10/19 5\' 7"  (1.702 m)    Weight:   Wt Readings from Last 1 Encounters:  08/20/19 100 kg    Ideal Body Weight:  67.27 kg  BMI:  Body mass index is 34.53 kg/m.  Estimated Nutritional Needs:   Kcal:  1850-2050  Protein:  90-105g  Fluid:  2L/day  Clayton Bibles, MS, RD, LDN Inpatient Clinical Dietitian Pager: [phone] After Hours Pager: [phone]

## 2019-08-21 DIAGNOSIS — R0989 Other specified symptoms and signs involving the circulatory and respiratory systems: Secondary | ICD-10-CM

## 2019-08-21 DIAGNOSIS — D62 Acute posthemorrhagic anemia: Secondary | ICD-10-CM

## 2019-08-21 LAB — GLUCOSE, CAPILLARY
Glucose-Capillary: 120 mg/dL — ABNORMAL HIGH (ref 70–99)
Glucose-Capillary: 122 mg/dL — ABNORMAL HIGH (ref 70–99)
Glucose-Capillary: 137 mg/dL — ABNORMAL HIGH (ref 70–99)
Glucose-Capillary: 148 mg/dL — ABNORMAL HIGH (ref 70–99)
Glucose-Capillary: 160 mg/dL — ABNORMAL HIGH (ref 70–99)
Glucose-Capillary: 161 mg/dL — ABNORMAL HIGH (ref 70–99)

## 2019-08-21 MED ORDER — ORAL CARE MOUTH RINSE
15.0000 mL | Freq: Two times a day (BID) | OROMUCOSAL | Status: DC
  Administered 2019-08-21 – 2019-09-09 (×35): 15 mL via OROMUCOSAL

## 2019-08-21 NOTE — Progress Notes (Signed)
[place] PHYSICAL MEDICINE & REHABILITATION PROGRESS NOTE  Subjective/Complaints: Patient seen laying in bed this morning.  Right gaze preference.  No reported issues overnight.  Verbal output inconsistent.  ROS: Unable to assess due to cognition/language  Objective: Vital Signs: Blood pressure (!) 171/93, pulse (!) 59, temperature 99 F (37.2 C), resp. rate 18, weight 98.1 kg, SpO2 94 %. No results found. No results for input(s): WBC, HGB, HCT, PLT in the last 72 hours. No results for input(s): NA, K, CL, CO2, GLUCOSE, BUN, CREATININE, CALCIUM in the last 72 hours.  Physical Exam: BP (!) 171/93 (BP Location: Left Arm)   Pulse (!) 59   Temp 99 F (37.2 C)   Resp 18   Wt 98.1 kg   SpO2 94%   BMI 33.87 kg/m  Constitutional: No distress . Vital signs reviewed.  Morbidly obese. HENT: Normocephalic.  Atraumatic.  + NG. Eyes: EOMI. No discharge. Cardiovascular: No JVD. Respiratory: Normal effort.  No stridor. GI: Non-distended. Skin: Warm and dry.  Intact. Psych: Unable to assess due to mentation. Musc: Lower extremity edema. Neurological: Alert Dysarthria Global aphasia, unchanged Motor: Moving right side freely No movement noted in left upper extremity, unchanged  Assessment/Plan: 1. Functional deficits secondary to right MCA infarct which require 3+ hours per day of interdisciplinary therapy in a comprehensive inpatient rehab setting.  Physiatrist is providing close team supervision and 24 hour management of active medical problems listed below.  Physiatrist and rehab team continue to assess barriers to discharge/monitor patient progress toward functional and medical goals  Care Tool:  Bathing        Body parts bathed by helper: Right arm, Abdomen, Chest, Left arm, Front perineal area, Buttocks, Right upper leg, Left upper leg, Right lower leg, Left lower leg, Face     Bathing assist Assist Level: 2 Helpers     Upper Body Dressing/Undressing Upper body  dressing   What is the patient wearing?: Hospital gown only    Upper body assist Assist Level: Total Assistance - Patient < 25%    Lower Body Dressing/Undressing Lower body dressing      What is the patient wearing?: Incontinence brief     Lower body assist Assist for lower body dressing: 2 Helpers     Toileting Toileting    Toileting assist Assist for toileting: 2 Helpers     Transfers Chair/bed transfer  Transfers assist     Chair/bed transfer assist level: Dependent - mechanical lift     Locomotion Ambulation   Ambulation assist   Ambulation activity did not occur: Safety/medical concerns          Walk 10 feet activity   Assist  Walk 10 feet activity did not occur: Safety/medical concerns        Walk 50 feet activity   Assist Walk 50 feet with 2 turns activity did not occur: Safety/medical concerns         Walk 150 feet activity   Assist Walk 150 feet activity did not occur: Safety/medical concerns         Walk 10 feet on uneven surface  activity   Assist Walk 10 feet on uneven surfaces activity did not occur: Safety/medical concerns         Wheelchair     Assist   Type of Wheelchair: Manual    Wheelchair assist level: Dependent - Patient 0% Max wheelchair distance: 150    Wheelchair 50 feet with 2 turns activity    Assist  Assist Level: Dependent - Patient 0%   Wheelchair 150 feet activity     Assist     Assist Level: Dependent - Patient 0%      Medical Problem List and Plan: 1.  Left side weakness with dysarthria and dysphagia secondary to acute right MCA infarction  Continue CIR 2.  Antithrombotics: -DVT/anticoagulation: Lovenox             -antiplatelet therapy: Aspirin 81 mg daily, Plavix 75 mg daily x3 months then Plavix alone 3. Pain Management: Voltaren gel as needed 4. Mood: Provide emotional support             -antipsychotic agents: N/A 5. Neuropsych: This patient is not  capable of making decisions on his own behalf. 6. Skin/Wound Care: Routine skin checks 7. Fluids/Electrolytes/Nutrition: Routine in and outs.     8.  Post stroke dysphagia.        N.p.o.             Nasogastric tube feeds.               Advance diet as tolerated 9.  Seizure disorder.  Continue Keppra 750 mg every 12 hours, EEG negative  No seizures since admission to rehab 10.  Essential hypertension.  Lopressor 25 mg twice daily  Very labile, monitor for trend  Monitor with increased mobility 11.  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  Hemoglobin A1c 6.2.  ?  Trending up, consider medications if persistently elevated             Monitor with increased mobility 12.  CKD stage III.               CMP ordered for tomorrow 13.  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  Latest blood cultures question contaminant per infectious disease.  Ancef completed 08/19/2019.             Repeat blood culture no growth 14.  CAD with history of CABG 1997.  Continue aspirin and Plavix 15.  Hyperlipidemia: Continue Lipitor 16.  BPH.  Flomax 0.4 mg daily.               PVRs x1 performed, await further recordings 17.  Morbid obesity.  BMI 35.70.  Dietary follow-up 18.  Acute blood loss anemia  Hemoglobin 12.3 on 1/14  Labs ordered for tomorrow  LOS: 2 days A FACE TO FACE EVALUATION WAS PERFORMED  Andres [name] Andres [name] 08/21/2019, 9:00 AM

## 2019-08-21 NOTE — Progress Notes (Signed)
Pt slept well, tolerating tube feed well. remains aox1 can make some short conversation

## 2019-08-22 ENCOUNTER — Inpatient Hospital Stay (HOSPITAL_COMMUNITY): Payer: Medicare (Managed Care) | Admitting: Occupational Therapy

## 2019-08-22 ENCOUNTER — Inpatient Hospital Stay (HOSPITAL_COMMUNITY): Payer: Medicare (Managed Care) | Admitting: Physical Therapy

## 2019-08-22 ENCOUNTER — Inpatient Hospital Stay (HOSPITAL_COMMUNITY): Payer: Medicare (Managed Care) | Admitting: Speech Pathology

## 2019-08-22 LAB — CBC WITH DIFFERENTIAL/PLATELET
Abs Immature Granulocytes: 0.19 10*3/uL — ABNORMAL HIGH (ref 0.00–0.07)
Basophils Absolute: 0 10*3/uL (ref 0.0–0.1)
Basophils Relative: 0 %
Eosinophils Absolute: 0.1 10*3/uL (ref 0.0–0.5)
Eosinophils Relative: 2 %
HCT: 43.1 % (ref 39.0–52.0)
Hemoglobin: 14.7 g/dL (ref 13.0–17.0)
Immature Granulocytes: 3 %
Lymphocytes Relative: 32 %
Lymphs Abs: 2.4 10*3/uL (ref 0.7–4.0)
MCH: 33.3 pg (ref 26.0–34.0)
MCHC: 34.1 g/dL (ref 30.0–36.0)
MCV: 97.5 fL (ref 80.0–100.0)
Monocytes Absolute: 0.7 10*3/uL (ref 0.1–1.0)
Monocytes Relative: 10 %
Neutro Abs: 3.9 10*3/uL (ref 1.7–7.7)
Neutrophils Relative %: 53 %
Platelets: 374 10*3/uL (ref 150–400)
RBC: 4.42 MIL/uL (ref 4.22–5.81)
RDW: 11.8 % (ref 11.5–15.5)
WBC: 7.3 10*3/uL (ref 4.0–10.5)
nRBC: 0 % (ref 0.0–0.2)

## 2019-08-22 LAB — COMPREHENSIVE METABOLIC PANEL
ALT: 21 U/L (ref 0–44)
AST: 39 U/L (ref 15–41)
Albumin: 3.2 g/dL — ABNORMAL LOW (ref 3.5–5.0)
Alkaline Phosphatase: 71 U/L (ref 38–126)
Anion gap: 13 (ref 5–15)
BUN: 27 mg/dL — ABNORMAL HIGH (ref 8–23)
CO2: 28 mmol/L (ref 22–32)
Calcium: 10.2 mg/dL (ref 8.9–10.3)
Chloride: 95 mmol/L — ABNORMAL LOW (ref 98–111)
Creatinine, Ser: 1.18 mg/dL (ref 0.61–1.24)
GFR calc Af Amer: >60 mL/min (ref >60)
GFR calc non Af Amer: 58 mL/min — ABNORMAL LOW (ref >60)
Glucose, Bld: 157 mg/dL — ABNORMAL HIGH (ref 70–99)
Potassium: 5 mmol/L (ref 3.5–5.1)
Sodium: 136 mmol/L (ref 135–145)
Total Bilirubin: 0.3 mg/dL (ref 0.3–1.2)
Total Protein: 6.6 g/dL (ref 6.5–8.1)

## 2019-08-22 LAB — GLUCOSE, CAPILLARY
Glucose-Capillary: 120 mg/dL — ABNORMAL HIGH (ref 70–99)
Glucose-Capillary: 130 mg/dL — ABNORMAL HIGH (ref 70–99)
Glucose-Capillary: 132 mg/dL — ABNORMAL HIGH (ref 70–99)
Glucose-Capillary: 133 mg/dL — ABNORMAL HIGH (ref 70–99)
Glucose-Capillary: 144 mg/dL — ABNORMAL HIGH (ref 70–99)

## 2019-08-22 MED ORDER — FREE WATER
100.0000 mL | Freq: Three times a day (TID) | Status: DC
  Administered 2019-08-22 – 2019-08-29 (×22): 100 mL

## 2019-08-22 NOTE — Progress Notes (Signed)
Physical Therapy Session Note  Patient Details  Name: Andres [name] MRN: [id number] Date of Birth: 1940-09-06  Today's Date: 08/22/2019 PT Individual Time: [id number] PT Individual Time Calculation (min): 42 min   Short Term Goals: Week 1:  PT Short Term Goal 1 (Week 1): Pt will sit EOB with max assist of 1 person PT Short Term Goal 2 (Week 1): Pt will transfer to [facility] with max A +2 and stedy PT Short Term Goal 3 (Week 1): Pt will attend to L side 25% of the time during functional activity to improve safety awareness PT Short Term Goal 4 (Week 1): Pt will tolerate sitting up in WC 3 hours between therapies.  Skilled Therapeutic Interventions/Progress Updates: Pt presented in bed with R mitt on laying sideways in bed with noted urinary incontinence on bed. Pt required maxAx2 to total A for rolling L/R with total A for peri-care. PTA would attempt to position pt's L leg however pt would straighten leg and become more resistive. Pt was dependent x 2 to boost to [facility]. Once pt cleaned and repositioned attempted to participate in L NMR however pt unable to consistently follow 1 step commands. Pt was able to respond to name however noted to speak 1-2 clear words then language would become garbled as well as close eyes frequently and fall asleep,  Session ultimately terminated due to lethargy.      Therapy Documentation Precautions:  Precautions Precautions: Fall Precaution Comments: Lt hemi Restrictions Weight Bearing Restrictions: No General: PT Amount of Missed Time (min): 33 Minutes PT Missed Treatment Reason: Patient fatigue Vital Signs:  Pain: Pain Assessment Pain Scale: 0-10 Faces Pain Scale: No hurt    Therapy/Group: Individual Therapy  Carlota Philley  Zineb Glade, PTA  08/22/2019, 11:58 AM

## 2019-08-22 NOTE — Progress Notes (Addendum)
Speech Language Pathology Daily Session Note  Patient Details  Name: Andres [name] MRN: [id number] Date of Birth: [birth year]  Today's Date: 08/22/2019 SLP Individual Time: 0730-0800 SLP Individual Time Calculation (min): 30 min  Short Term Goals: Week 1: SLP Short Term Goal 1 (Week 1): Pt will consume therapeutic trials of thin liquids, ice chips, or purees with max assist multimodal cues for use of swallowing precautions and minimal overt s/s of aspiratin over 3 consecutive sessions prior to instrumental assessment. SLP Short Term Goal 2 (Week 1): Pt will focus his attention to stimuli in >25% of opportunities with max assist multimodal cues. SLP Short Term Goal 3 (Week 1): Pt will remain alert for >1 minute with max assist multimodal cues. SLP Short Term Goal 4 (Week 1): Pt will orient to place, date, and situation with max assist multimodal cues. SLP Short Term Goal 5 (Week 1): Pt will visually scan to the left of midline in >25% of opportunities during basic structured tasks with max assist multimodal cues.  Skilled Therapeutic Interventions: Pt was seen for skilled ST targeting cognition. SLP facilitated session with Max A multimodal cues for pt to achieve focused attention and maintain arousal. Pt minimally responsive to all techniques implored (sternal rub, cold press, environmental modification, loud voice, etc.). Pt would open eyes and achieve focused attention in 50% opportunities but arousal was fleeting (<30 seconds). SLP provided hand over hand assist to complete oral care with hopes to pursue PO trials, however even during fleeting moments of arousal that would have warranted PO intake, pt unable to follow commands to achieve labial seal around straw. Pt also required Total A hand over hand assist for completing basic self care tasks, such as washing face with a rag. Pt verbally responded to ~50% SLP's questions, however speech was garbles and largely unintelligible. Pt answered "yes"  when asked "do you know where we are" however unable to further elaborate or use calendar to assist. Pt left laying semi-reclined in bed with alarm set, right mitt donned, and needs within reach. Continue per current plan of care.      Pain Pain Assessment Pain Scale: Faces Faces Pain Scale: No hurt  Therapy/Group: Individual Therapy  Arbutus Leas 08/22/2019, 8:03 AM

## 2019-08-22 NOTE — Plan of Care (Signed)
  Problem: Consults Goal: RH GENERAL PATIENT EDUCATION Description: See Patient Education module for education specifics. Outcome: Progressing Goal: Skin Care Protocol Initiated - if Braden Score 18 or less Description: If consults are not indicated, leave blank or document N/A Outcome: Progressing Goal: Nutrition Consult-if indicated Outcome: Progressing Goal: Diabetes Guidelines if Diabetic/Glucose > 140 Description: If diabetic or lab glucose is > 140 mg/dl - Initiate Diabetes/Hyperglycemia Guidelines & Document Interventions  Outcome: Progressing   Problem: RH BOWEL ELIMINATION Goal: RH STG MANAGE BOWEL WITH ASSISTANCE Description: STG Manage Bowel with mod Assistance. Outcome: Progressing Goal: RH STG MANAGE BOWEL W/MEDICATION W/ASSISTANCE Description: STG Manage Bowel with Medication with min Assistance. Outcome: Progressing   Problem: RH BLADDER ELIMINATION Goal: RH STG MANAGE BLADDER WITH ASSISTANCE Description: STG Manage Bladder With mod Assistance Outcome: Progressing   Problem: RH SKIN INTEGRITY Goal: RH STG SKIN FREE OF INFECTION/BREAKDOWN Description: No new breakdown prior to discharge Outcome: Progressing Goal: RH STG MAINTAIN SKIN INTEGRITY WITH ASSISTANCE Description: STG Maintain Skin Integrity With  mod Assistance. Outcome: Progressing   Problem: RH SAFETY Goal: RH STG ADHERE TO SAFETY PRECAUTIONS W/ASSISTANCE/DEVICE Description: STG Adhere to Safety Precautions With  mod Assistance/Device. Outcome: Progressing   Problem: RH PAIN MANAGEMENT Goal: RH STG PAIN MANAGED AT OR BELOW PT'S PAIN GOAL Description: Less than 4 Outcome: Progressing   Problem: RH KNOWLEDGE DEFICIT GENERAL Goal: RH STG INCREASE KNOWLEDGE OF SELF CARE AFTER HOSPITALIZATION Description: Patient 's family will be able to verbalize how to assist patient in self care prior to discharge Outcome: Progressing

## 2019-08-22 NOTE — Progress Notes (Signed)
Inpatient Rehabilitation  Patient information reviewed and entered into eRehab system by Karmelo Bass M. Damon Baisch, M.A., CCC/SLP, PPS Coordinator.  Information including medical coding, functional ability and quality indicators will be reviewed and updated through discharge.    

## 2019-08-22 NOTE — Discharge Instructions (Signed)
Inpatient Rehab Discharge Instructions  Andres [name] Discharge date and time: No discharge date for patient encounter.   Activities/Precautions/ Functional Status: Activity: activity as tolerated Diet:  Wound Care: none needed Functional status:  ___ No restrictions     ___ Walk up steps independently ___ 24/7 supervision/assistance   ___ Walk up steps with assistance ___ Intermittent supervision/assistance  ___ Bathe/dress independently ___ Walk with walker     _x__ Bathe/dress with assistance ___ Walk Independently    ___ Shower independently ___ Walk with assistance    ___ Shower with assistance ___ No alcohol     ___ Return to work/school ________  Special Instructions: Continue aspirin and Plavix x3 months then Plavix alone STROKE/TIA DISCHARGE INSTRUCTIONS SMOKING Cigarette smoking nearly doubles your risk of having a stroke & is the single most alterable risk factor  If you smoke or have smoked in the last 12 months, you are advised to quit smoking for your health.  Most of the excess cardiovascular risk related to smoking disappears within a year of stopping.  Ask you doctor about anti-smoking medications  Council Quit Line: 1-800-QUIT NOW  Free Smoking Cessation Classes [phone]  CHOLESTEROL Know your levels; limit fat & cholesterol in your diet  Lipid Panel     Component Value Date/Time   CHOL 152 08/10/2019 0316   TRIG 80 08/10/2019 0316   HDL 36 (L) 08/10/2019 0316   CHOLHDL 4.2 08/10/2019 0316   VLDL 16 08/10/2019 0316   LDLCALC 100 (H) 08/10/2019 0316      Many patients benefit from treatment even if their cholesterol is at goal.  Goal: Total Cholesterol (CHOL) less than 160  Goal:  Triglycerides (TRIG) less than 150  Goal:  HDL greater than 40  Goal:  LDL (LDLCALC) less than 100   BLOOD PRESSURE American Stroke Association blood pressure target is less that 120/80 mm/Hg  Your discharge blood pressure is:  BP: (!) 155/92  Monitor your blood  pressure  Limit your salt and alcohol intake  Many individuals will require more than one medication for high blood pressure  DIABETES (A1c is a blood sugar average for last 3 months) Goal HGBA1c is under 7% (HBGA1c is blood sugar average for last 3 months)  Diabetes:   Lab Results  Component Value Date   HGBA1C 6.2 (H) 08/10/2019     Your HGBA1c can be lowered with medications, healthy diet, and exercise.  Check your blood sugar as directed by your physician  Call your physician if you experience unexplained or low blood sugars.  PHYSICAL ACTIVITY/REHABILITATION Goal is 30 minutes at least 4 days per week  Activity: Increase activity slowly, Therapies: Physical Therapy: Home Health Return to work:   Activity decreases your risk of heart attack and stroke and makes your heart stronger.  It helps control your weight and blood pressure; helps you relax and can improve your mood.  Participate in a regular exercise program.  Talk with your doctor about the best form of exercise for you (dancing, walking, swimming, cycling).  DIET/WEIGHT Goal is to maintain a healthy weight  Your discharge diet is:  Diet Order            Diet NPO time specified  Diet effective now              liquids Your height is:    Your current weight is: Weight: 98.1 kg Your Body Mass Index (BMI) is:  BMI (Calculated): 33.86  Following the type of  diet specifically designed for you will help prevent another stroke.  Your goal weight range is:    Your goal Body Mass Index (BMI) is 19-24.  Healthy food habits can help reduce 3 risk factors for stroke:  High cholesterol, hypertension, and excess weight.  RESOURCES Stroke/Support Group:  Call [phone]   STROKE EDUCATION PROVIDED/REVIEWED AND GIVEN TO PATIENT Stroke warning signs and symptoms How to activate emergency medical system (call 911). Medications prescribed at discharge. Need for follow-up after discharge. Personal risk factors for  stroke. Pneumonia vaccine given:  Flu vaccine given:  My questions have been answered, the writing is legible, and I understand these instructions.  I will adhere to these goals & educational materials that have been provided to me after my discharge from the hospital.      My questions have been answered and I understand these instructions. I will adhere to these goals and the provided educational materials after my discharge from the hospital.  Patient/Caregiver Signature _______________________________ Date __________  Clinician Signature _______________________________________ Date __________  Please bring this form and your medication list with you to all your follow-up doctor's appointments.

## 2019-08-22 NOTE — Progress Notes (Signed)
[place] PHYSICAL MEDICINE & REHABILITATION PROGRESS NOTE  Subjective/Complaints:  Awake , alert but with delayed responses , pt disoriented, denies hx of herrnia   ROS: Unable to assess due to cognition/language  Objective: Vital Signs: Blood pressure (!) 155/92, pulse 77, temperature (!) 97 F (36.1 C), temperature source Axillary, resp. rate 18, weight 97.3 kg, SpO2 98 %. No results found. Recent Labs    08/22/19 0606  WBC 7.3  HGB 14.7  HCT 43.1  PLT 374   Recent Labs    08/22/19 0606  NA 136  K 5.0  CL 95*  CO2 28  GLUCOSE 157*  BUN 27*  CREATININE 1.18  CALCIUM 10.2    Physical Exam: BP (!) 155/92 (BP Location: Left Arm)   Pulse 77   Temp (!) 97 F (36.1 C) (Axillary)   Resp 18   Wt 97.3 kg   SpO2 98%   BMI 33.60 kg/m  Constitutional: No distress . Vital signs reviewed.  Morbidly obese. HENT: Normocephalic.  Atraumatic.  + NG. Eyes: EOMI. No discharge. Cardiovascular: No JVD. Respiratory: Normal effort.  No stridor. GU scrotal edema, penile shaft not visualized, able to see meatus   GI: Non-distended. Skin: Warm and dry.  Intact. Psych: Unable to assess due to mentation. Musc: Lower extremity edema. Neurological: Alert Dysarthria Global aphasia, unchanged Motor: anitgravity movement on L side no movement noted on right , unable to follow commands for manual muscle testing Severe Left neglect does not cross midline   Assessment/Plan: 1. Functional deficits secondary to right MCA infarct which require 3+ hours per day of interdisciplinary therapy in a comprehensive inpatient rehab setting.  Physiatrist is providing close team supervision and 24 hour management of active medical problems listed below.  Physiatrist and rehab team continue to assess barriers to discharge/monitor patient progress toward functional and medical goals  Care Tool:  Bathing        Body parts bathed by helper: (whole body)     Bathing assist Assist Level: 2  Helpers     Upper Body Dressing/Undressing Upper body dressing   What is the patient wearing?: Hospital gown only    Upper body assist Assist Level: Dependent - Patient 0%    Lower Body Dressing/Undressing Lower body dressing      What is the patient wearing?: Incontinence brief     Lower body assist Assist for lower body dressing: 2 Helpers     Toileting Toileting    Toileting assist Assist for toileting: 2 Helpers     Transfers Chair/bed transfer  Transfers assist     Chair/bed transfer assist level: Dependent - mechanical lift     Locomotion Ambulation   Ambulation assist   Ambulation activity did not occur: Safety/medical concerns          Walk 10 feet activity   Assist  Walk 10 feet activity did not occur: Safety/medical concerns        Walk 50 feet activity   Assist Walk 50 feet with 2 turns activity did not occur: Safety/medical concerns         Walk 150 feet activity   Assist Walk 150 feet activity did not occur: Safety/medical concerns         Walk 10 feet on uneven surface  activity   Assist Walk 10 feet on uneven surfaces activity did not occur: Safety/medical concerns         Wheelchair     Assist   Type of Wheelchair: Manual  Wheelchair assist level: Dependent - Patient 0% Max wheelchair distance: 150    Wheelchair 50 feet with 2 turns activity    Assist        Assist Level: Dependent - Patient 0%   Wheelchair 150 feet activity     Assist     Assist Level: Dependent - Patient 0%      Medical Problem List and Plan: 1.  Left side weakness with dysarthria and dysphagia secondary to acute right MCA infarction  Continue CIR PT,  OT ,SLP 2.  Antithrombotics: -DVT/anticoagulation: Lovenox             -antiplatelet therapy: Aspirin 81 mg daily, Plavix 75 mg daily x3 months then Plavix alone 3. Pain Management: Voltaren gel as needed 4. Mood: Provide emotional support              -antipsychotic agents: N/A 5. Neuropsych: This patient is not capable of making decisions on his own behalf. 6. Skin/Wound Care: Routine skin checks 7. Fluids/Electrolytes/Nutrition: Routine in and outs.     8.  Post stroke dysphagia.        N.p.o.             Nasogastric tube feeds.               Advance diet as tolerated 9.  Seizure disorder.  Continue Keppra 750 mg every 12 hours, EEG negative  No seizures since admission to rehab 10.  Essential hypertension.  Lopressor 25 mg twice daily  Very labile, monitor for trend  Monitor with increased mobility 11.  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  Hemoglobin A1c 6.2.  ?  Trending up, consider medications if persistently elevated             Monitor with increased mobility 12.  CKD stage III.              Creat normal this am BUN mildly elevated will increase free H2O 13.  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  Latest blood cultures question contaminant per infectious disease.  Ancef completed 08/19/2019.             Repeat blood culture no growth 14.  CAD with history of CABG 1997.  Continue aspirin and Plavix 15.  Hyperlipidemia: Continue Lipitor 16.  BPH.  Flomax 0.4 mg daily.               PVRs x1 performed, await further recordings 17.  Morbid obesity.  BMI 35.70.  Dietary follow-up 18.  Acute blood loss anemia  Hemoglobin 12.3 on 1/14, 14.7 on 1/18   Labs ordered for tomorrow  LOS: 3 days A FACE TO [place] E Andres [name] 08/22/2019, 8:17 AM

## 2019-08-22 NOTE — Care Management (Signed)
Patient Details  Name: Andres [name] MRN: [id number] Date of Birth: Apr 21, 1941  Today's Date: 08/22/2019  Problem List:  Patient Active Problem List   Diagnosis Date Noted  . Acute blood loss anemia   . Labile blood pressure   . Diabetes mellitus type 2 in obese ([place])   . 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 Seizures ([place])   . Stage 3 chronic kidney disease   . Dysphagia, post-stroke   . Coagulase negative Staphylococcus bacteremia 08/15/2019  . Acute ischemic stroke ([place]) 08/09/2019  . Transaminitis 08/09/2019  . History of seizures 08/09/2019  . Left hip pain 03/02/2019  . Syncope 09/05/2018  . Gout 07/10/2018  . Scrotal swelling 06/09/2018  . Urinary dribbling 06/03/2017  . Mass of scrotum 06/03/2017  . Injury of left hand 07/21/2015  . Hx of   sessile serrated colonic polyp 02/27/2015  . Chronic anticoagulation 12/22/2014  . Colon cancer screening 12/22/2014  . Morbid obesity ([place]) 09/29/2014  . Mitral valve prolapse 11/24/2013  . IT band syndrome 09/23/2013  . Hypotension, unspecified 07/21/2013  . Dyspnea 07/21/2013  . Elevated troponin I level- (pk Troponin 0.34 in setting of acute renal insufficency) 07/21/2013  . 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 Last nuc 2012 negative. 07/21/2013  . SBO (small bowel obstruction) ([place]) 07/21/2013  . Inguinal hernia, Rt reduced, Lt present 07/21/2013  . Acute renal failure ([place]) 07/21/2013  . Pneumonia, aspiration ([place]) 07/21/2013  . Obesity (BMI 30-39.9) 01/17/2013  . Osteoarthritis, hand, primary localized 08/12/2012  . Preventative health care 04/11/2011  . MILD COGNITIVE IMPAIRMENT SO STATED 10/03/2010  . CEREBROVASCULAR ACCIDENT, HX OF 10/03/2010  . RASH-NONVESICULAR 05/12/2008  . TRANSIENT ISCHEMIC ATTACK 01/27/2008  . Diabetes ([place]) 12/08/2007  . ALLERGIC RHINITIS 12/08/2007  . DIVERTICULOSIS, COLON 12/08/2007  . LOW BACK PAIN 12/08/2007  . NEPHROLITHIASIS, HX OF 12/08/2007   . BPH (benign prostatic hyperplasia) 04/05/2007  . Hyperlipidemia 04/02/2007  . CARPAL TUNNEL SYNDROME, BILATERAL 04/02/2007  . CATARACT NOS 04/02/2007  . Essential hypertension 04/02/2007   Past Medical History:  Past Medical History:  Diagnosis Date  . ALLERGIC RHINITIS   . Benign prostatic hypertrophy   . 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 Last nuc 2012 negative. 07/21/2013  . Carpal tunnel syndrome, bilateral   . Cataracts, bilateral   . Coronary artery disease    Dr. Claiborne Billings; 2D ECHO, 12/11/2011 - EF 50-55%, normal; NUCLEAR STRESS TEST, 10/16/2010 - no evidemce of inducible ischemia  . Diabetes mellitus type II   . Diverticulosis of colon   . GERD (gastroesophageal reflux disease)   . H/O hiatal hernia   . Head mass   . Hepatitis   . Hx of   sessile serrated colonic polyp 02/27/2015  . Hyperlipidemia   . Hypertension   . Inguinal hernia, Rt reduced, Lt present 07/21/2013  . Low back pain   . Mitral valve prolapse 11/24/2013   By echo dec 2014  . Nephrolithiasis   . Obesity, morbid ([place])   . Occlusion and stenosis of carotid artery without mention of cerebral infarction    CAROTID DOPPLER, 03/18/2012 - srable, mild, hard, plaque noted, bilaterally  . Stroke [facility])    Past Surgical History:  Past Surgical History:  Procedure Laterality Date  . APPENDECTOMY    . CARDIAC CATHETERIZATION    . CATARACT EXTRACTION Right   . CORONARY ARTERY BYPASS GRAFT    . INGUINAL HERNIA REPAIR Right 07/26/2013  Procedure: REPAIR  INCARCERATED RIGHT INGUINAL  HERNIA ;  Surgeon: Gwenyth Ober, MD;  Location: [facility];  Service: General;  Laterality: Right;   Social History:  reports that he quit smoking about 29 years ago. His smoking use included cigarettes. He has never used smokeless tobacco. He reports that he does not drink alcohol or use drugs.  Family / Support Systems Patient Roles: Parent Children: Son and daughter Anticipated Caregiver: Daughter; [name] and hired  caregivers Ability/Limitations of Caregiver: [name] works in health care; plans to take patient home to [place] and hire caregivers Caregiver Availability: 24/7  Social History Preferred language: English Religion: Baptist     Abuse/Neglect Abuse/Neglect Assessment Can Be Completed: Unable to assess, patient is non-responsive or altered mental status  Emotional Status Pt's affect, behavior and adjustment status: Flat affect, confused, lethargic and oriented to self only Recent Psychosocial Issues: Impaired cognition  Patient / Family Perceptions, Expectations & Goals Pt/Family understanding of illness & functional limitations: Daughter appears to have a good understanding of current health and functional limitations Premorbid pt/family roles/activities: Lived with son, out in the community with [facility], slept in a recliner chair Anticipated changes in roles/activities/participation: Will need 24/7 care at discharge Pt/family expectations/goals: Daughter plans to take patient home to [place] and provide for 24/7 care  [employer]    Discharge Planning Living Arrangements: Children Support Systems: Children Type of Residence: Private residence Does the patient have any problems obtaining your medications?: No Home Management: Daughter will manage the home; cooking, cleaning, laudry, Social research officer, government. Patient/Family Preliminary Plans: Daughter works; plans to hire help [place] Barriers to Discharge: Lack of/limited family support, Nutrition means Sw Barriers to Discharge Comments: Currently with a cortrak and tube feeds for nutrition Social Work Anticipated Follow Up Needs: HH/OP Expected length of stay: 4 weeks  Clinical Impression The patient is lethargic, alert to self with cognitive impairments. Patient had intermittent memory issues related to time/date prior to admission otherwise ; was able to get out into the community with a SPC. Currently has decreased motor planning, apraxia, ataxia,  visual field cut, dysphagia and slow processing, poor awareness and decreased attention.   Dorien Chihuahua B 08/22/2019, 1:42 PM

## 2019-08-22 NOTE — Progress Notes (Signed)
Pt has been very restless overnight, moving around in bed constantly, attempting to lay flat , hangs his legs out the bed so he can lay his head in the fold of the bed. May benefit from bolus feeds as it is difficult to keep him at 30 degrees overnight. PVRs have been low

## 2019-08-22 NOTE — Care Management (Signed)
[facility] Individual Statement of Services  Patient Name:  Andres [name]  Date:  08/22/2019  Welcome to the [facility].  Our goal is to provide you with an individualized program based on your diagnosis and situation, designed to meet your specific needs.  With this comprehensive rehabilitation program, you will be expected to participate in at least 3 hours of rehabilitation therapies Monday-Friday, with modified therapy programming on the weekends.  Your rehabilitation program will include the following services:  Physical Therapy (PT), Occupational Therapy (OT), Speech Therapy (ST), 24 hour per day rehabilitation nursing, Therapeutic Recreaction (TR), Neuropsychology, Case Management (Social Worker), Rehabilitation Medicine, Nutrition Services and Pharmacy Services  Weekly team conferences will be held on Wednesdays to discuss your progress.  Your Social Worker will talk with you frequently to get your input and to update you on team discussions.  Team conferences with you and your family in attendance may also be held.  Expected length of stay: 3.5 - 4 weeks Overall anticipated outcome: Moderate Assistance   Depending on your progress and recovery, your program may change. Your Social Worker will coordinate services and will keep you informed of any changes. Your Social Worker's name and contact numbers are listed  below.  The following services may also be recommended but are not provided by the [facility]:    [place] will be made to provide these services after discharge if needed.  Arrangements include referral to agencies that provide these services.  Your insurance has been verified to be:  Medicare Advantage Your primary doctor is:  Dr. Cathlean Cower  Pertinent information will be shared with your doctor and your insurance  company.  Social Worker:  [place], [place] or ([phone]   Information discussed with and copy given to patient by: Margarito Liner, 08/22/2019, 8:25 AM

## 2019-08-22 NOTE — Progress Notes (Signed)
Occupational Therapy Session Note  Patient Details  Name: Andres [name] MRN: [id number] Date of Birth: Feb 24, 1941  Today's Date: 08/22/2019 OT Individual Time: [id number] OT Individual Time Calculation (min): 27 min    Short Term Goals: Week 1:  OT Short Term Goal 1 (Week 1): Pt will complete toilet or BSC transfer with 2 assist and LRAD OT Short Term Goal 2 (Week 1): Pt will initiate washing 1 body area when handed a wash cloth during bathing OT Short Term Goal 3 (Week 1): Pt will assist with threading L UE into a shirt with max vcs  Skilled Therapeutic Interventions/Progress Updates:    Upon entering the room, pt supine in bed and RN present attempting to roll pt to change brief as he was incontinent in bed and on clothing. Pt requiring +2 assist for repositioning and rolling in bed. Once pt is repositioning, pt pushing with R UE on hospital bed rail and pushing self towards bottom of bed. Pt with significant R gaze preference and he was able to move head to look towards the L but eyes do not track past midline. OT attempting oral care with suction toothbrush but pt clamping mouth shut and not allowing therapist to provide for needs. Pt no opening eyes during this session and sleeping soundly. Pt unable to follow 1 step commands or attend this session. OT lowered bed and placed matts on floor for safety with bed alarm activated. OT discussed with team the possibility of needing low bed for further safety. 30 missed minutes secondary to pt lethargy.  Therapy Documentation Precautions:  Precautions Precautions: Fall Precaution Comments: Lt hemi Restrictions Weight Bearing Restrictions: No General: General OT Amount of Missed Time: 30 Minutes PT Missed Treatment Reason: Patient fatigue ADL: ADL Eating: Not assessed Grooming: Dependent Where Assessed-Grooming: Bed level Upper Body Bathing: Dependent Where Assessed-Upper Body Bathing: Edge of bed Lower Body Bathing: Dependent Where  Assessed-Lower Body Bathing: Bed level Upper Body Dressing: Dependent Where Assessed-Upper Body Dressing: Edge of bed Lower Body Dressing: Dependent Where Assessed-Lower Body Dressing: Bed level Toileting: Not assessed Toilet Transfer: Not assessed Tub/Shower Transfer: Not assessed ADL Comments: Pt is dependent +2 assist during self care tasks excluding grooming (+1 assist)   Therapy/Group: Individual Therapy  Gypsy Decant 08/22/2019, 2:51 PM

## 2019-08-22 NOTE — IPOC Note (Signed)
Overall Plan of Care [facility]) Patient Details Name: Andres [name] MRN: [id number] DOB: July 01, 1941  Admitting Diagnosis: Right middle cerebral artery stroke [facility])  Hospital Problems: Principal Problem:   Right middle cerebral artery stroke [facility]) Active Problems:   Diabetes mellitus type 2 in obese ([place])   Acute blood loss anemia   Labile blood pressure     Functional Problem List: Nursing Behavior, Bladder, Bowel, Edema, Endurance, Medication Management, Motor, Nutrition, Pain, Safety, Skin Integrity  PT Balance, Behavior, Edema, Endurance, Motor, Nutrition, Pain, Perception, Sensory, Safety, Skin Integrity  OT Balance, Perception, Behavior, Safety, Cognition, Sensory, Endurance, Vision, Motor, Nutrition  SLP Cognition, Nutrition, Linguistic  TR         Basic ADL's: OT Grooming, Bathing, Dressing, Toileting     Advanced  ADL's: OT Simple Meal Preparation     Transfers: PT Bed Mobility, Bed to Chair, Car, [employer], Editor, commissioning, Agricultural engineer: PT Ambulation, Emergency planning/management officer, Stairs     Additional Impairments: OT Fuctional Use of Upper Extremity  SLP Swallowing, Communication, Social Cognition expression Problem Solving, Memory, Attention, Awareness  TR      Anticipated Outcomes Item Anticipated Outcome  Self Feeding No goal  Swallowing  Mod assist   Basic self-care  Mod A  Toileting  Mod A   Bathroom Transfers Mod A  Bowel/Bladder  managed mod assist  Transfers  Mod assist with SB or squat pivot to access WC  Locomotion  Min-Mod assist WC level.  Communication  Mod assist  Cognition  mod assist  Pain  less thsn 4  Safety/Judgment  mod assist   Therapy Plan: PT Intensity: Minimum of 1-2 x/day ,45 to 90 minutes PT Frequency: 5 out of 7 days PT Duration Estimated Length of Stay: 4-5 weeks OT Intensity: Minimum of 1-2 x/day, 45 to 90 minutes OT Frequency: 5 out of 7 days OT Duration/Estimated Length of Stay: 3.5-4 weeks SLP  Intensity: Minumum of 1-2 x/day, 30 to 90 minutes SLP Frequency: 3 to 5 out of 7 days SLP Duration/Estimated Length of Stay: 21-28 days   Due to the current state of emergency, patients may not be receiving their 3-hours of Medicare-mandated therapy.   Team Interventions: Nursing Interventions Patient/Family Education, Bladder Management, Bowel Management, Disease Management/Prevention, Pain Management, Medication Management, Skin Care/Wound Management, Cognitive Remediation/Compensation, Dysphagia/Aspiration Precaution Training, Discharge Planning, Psychosocial Support  PT interventions Ambulation/gait training, Discharge planning, Functional mobility training, Psychosocial support, Visual/perceptual remediation/compensation, Therapeutic Activities, Wheelchair propulsion/positioning, Therapeutic Exercise, Skin care/wound management, Neuromuscular re-education, Disease management/prevention, Training and development officer, Cognitive remediation/compensation, DME/adaptive equipment instruction, Pain management, UE/LE Strength taining/ROM, UE/LE Coordination activities, Splinting/orthotics, Stair training, Patient/family education, Functional electrical stimulation, Community reintegration  OT Interventions Training and development officer, Discharge planning, Functional electrical stimulation, Pain management, Self Care/advanced ADL retraining, Therapeutic Activities, UE/LE Coordination activities, Visual/perceptual remediation/compensation, Therapeutic Exercise, Patient/family education, Functional mobility training, Disease mangement/prevention, Cognitive remediation/compensation, Academic librarian, Engineer, drilling, Neuromuscular re-education, Psychosocial support, Splinting/orthotics, UE/LE Strength taining/ROM, Wheelchair propulsion/positioning  SLP Interventions Cognitive remediation/compensation, English as a second language teacher, Functional tasks, Patient/family education, Environmental controls,  Dysphagia/aspiration precaution training, Internal/external aids  TR Interventions    SW/CM Interventions Discharge Planning, Disease Management/Prevention, Psychosocial Support, Patient/Family Education   Barriers to Discharge MD  Medical stability and Behavior  Nursing      PT Inaccessible home environment, Decreased caregiver support, Medical stability, Home environment access/layout, Incontinence, Wound Care, Insurance for SNF coverage, Weight, Medication compliance, Behavior    OT Lack of/limited family support, Medical stability, Incontinence, Insurance for SNF coverage, Behavior  SLP      SW Lack of/limited family support, Nutrition means Currently with a cortrak and tube feeds for nutrition   Team Discharge Planning: Destination: PT-Skilled Nursing Facility (SNF) ,OT- Home , [place] (SNF) Projected Follow-up: PT-Skilled nursing facility, OT-  Home health OT, Skilled nursing facility, SLP-Skilled Nursing facility, 24 hour supervision/assistance Projected Equipment Needs: PT-To be determined, OT- To be determined, SLP-None recommended by SLP Equipment Details: PT- , OT-  Patient/family involved in discharge planning: PT- Patient, Family member/caregiver,  OT-Patient, SLP-Family member/caregiver  MD ELOS: 21-25d Medical Rehab Prognosis:  Fair Assessment:  79 year old right-handed male with history of hypertension, CKD stage III, morbid obesity with BMI 35.70, hyperlipidemia, history of seizure maintained on Keppra, CAD status post CABG, history of CVA maintained on aspirin 81 mg daily and Plavix, carotid artery stenosis, mitral valve prolapse, diet-controlled diabetes mellitus, BPH and remote tobacco abuse.  History taken from chart review and therapist due to cognition.  Patient lives with son.  Reportedly independent prior to admission using a single-point cane.  Presented on 08/09/2019 on the floor with left-sided weakness and slurred speech.  Cranial CT  revealed hyperdense right MCA, distal M1 and proximal M2 segments.  No acute hemorrhage.  Patient did not receive TPA.  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  Carotid and aortic atherosclerosis without stenosis.  MRI showed large area of acute infarction right MCA territory surrounding chronic infarct in the right parietal lobe.  Echocardiogram with ejection fraction of 60% without emboli.  EEG negative for seizure.  Admission chemistries with creatinine 1.35, SARS coronavirus negative, hemoglobin 13.8.  Neurology follow-up currently remains on aspirin and Plavix therapy for CVA prophylaxis.  Subcutaneous Lovenox for DVT prophylaxis.  Keppra ongoing as prior to admission for history of seizure disorder.  Blood cultures did show coagulase-negative staph question contaminant and remained on cefazolin per infectious disease with repeat blood cultures with no growth to date and no plan for TEE with recommendations to complete Ancef 08/19/2019.  Patient currently remains n.p.o. with alternative means of nutritional support.  Therapy evaluations completed and patient was admitted for a comprehensive rehab program.   Now requiring 24/7 Rehab RN,MD, as well as CIR level PT, OT and SLP.  Treatment team will focus on ADLs and mobility with goals set at Mod A See Team Conference Notes for weekly updates to the plan of care

## 2019-08-23 ENCOUNTER — Inpatient Hospital Stay (HOSPITAL_COMMUNITY): Payer: Medicare (Managed Care)

## 2019-08-23 ENCOUNTER — Inpatient Hospital Stay (HOSPITAL_COMMUNITY): Payer: Medicare (Managed Care) | Admitting: Occupational Therapy

## 2019-08-23 ENCOUNTER — Inpatient Hospital Stay (HOSPITAL_COMMUNITY): Payer: Medicare (Managed Care) | Admitting: Speech Pathology

## 2019-08-23 LAB — GLUCOSE, CAPILLARY
Glucose-Capillary: 117 mg/dL — ABNORMAL HIGH (ref 70–99)
Glucose-Capillary: 122 mg/dL — ABNORMAL HIGH (ref 70–99)
Glucose-Capillary: 129 mg/dL — ABNORMAL HIGH (ref 70–99)
Glucose-Capillary: 141 mg/dL — ABNORMAL HIGH (ref 70–99)
Glucose-Capillary: 83 mg/dL (ref 70–99)
Glucose-Capillary: 94 mg/dL (ref 70–99)

## 2019-08-23 LAB — BASIC METABOLIC PANEL
Anion gap: 13 (ref 5–15)
BUN: 30 mg/dL — ABNORMAL HIGH (ref 8–23)
CO2: 26 mmol/L (ref 22–32)
Calcium: 10.7 mg/dL — ABNORMAL HIGH (ref 8.9–10.3)
Chloride: 97 mmol/L — ABNORMAL LOW (ref 98–111)
Creatinine, Ser: 1.43 mg/dL — ABNORMAL HIGH (ref 0.61–1.24)
GFR calc Af Amer: 53 mL/min — ABNORMAL LOW (ref >60)
GFR calc non Af Amer: 46 mL/min — ABNORMAL LOW (ref >60)
Glucose, Bld: 132 mg/dL — ABNORMAL HIGH (ref 70–99)
Potassium: 5.2 mmol/L — ABNORMAL HIGH (ref 3.5–5.1)
Sodium: 136 mmol/L (ref 135–145)

## 2019-08-23 LAB — URINALYSIS, ROUTINE W REFLEX MICROSCOPIC
Bacteria, UA: NONE SEEN
Bilirubin Urine: NEGATIVE
Glucose, UA: NEGATIVE mg/dL
Hgb urine dipstick: NEGATIVE
Ketones, ur: NEGATIVE mg/dL
Leukocytes,Ua: NEGATIVE
Nitrite: NEGATIVE
Protein, ur: 30 mg/dL — AB
Specific Gravity, Urine: 1.017 (ref 1.005–1.030)
pH: 6 (ref 5.0–8.0)

## 2019-08-23 LAB — CBC WITH DIFFERENTIAL/PLATELET
Abs Immature Granulocytes: 0.16 10*3/uL — ABNORMAL HIGH (ref 0.00–0.07)
Basophils Absolute: 0.1 10*3/uL (ref 0.0–0.1)
Basophils Relative: 1 %
Eosinophils Absolute: 0.1 10*3/uL (ref 0.0–0.5)
Eosinophils Relative: 1 %
HCT: 44.4 % (ref 39.0–52.0)
Hemoglobin: 15.1 g/dL (ref 13.0–17.0)
Immature Granulocytes: 2 %
Lymphocytes Relative: 35 %
Lymphs Abs: 2.9 10*3/uL (ref 0.7–4.0)
MCH: 33.4 pg (ref 26.0–34.0)
MCHC: 34 g/dL (ref 30.0–36.0)
MCV: 98.2 fL (ref 80.0–100.0)
Monocytes Absolute: 0.8 10*3/uL (ref 0.1–1.0)
Monocytes Relative: 9 %
Neutro Abs: 4.3 10*3/uL (ref 1.7–7.7)
Neutrophils Relative %: 52 %
Platelets: 491 10*3/uL — ABNORMAL HIGH (ref 150–400)
RBC: 4.52 MIL/uL (ref 4.22–5.81)
RDW: 11.9 % (ref 11.5–15.5)
WBC: 8.3 10*3/uL (ref 4.0–10.5)
nRBC: 0 % (ref 0.0–0.2)

## 2019-08-23 MED ORDER — SODIUM CHLORIDE 0.45 % IV SOLN: INTRAVENOUS | Status: DC

## 2019-08-23 NOTE — Progress Notes (Signed)
Pt constantly sliding himself down in the bed in a matter of minutes. RN felt that administering tube feedings at this time would be unsafe due to the risk for aspiration. RN spoke with PA Reesa Chew about this and the decision to hold the feedings was made. Pt currently getting IV fluids.

## 2019-08-23 NOTE — Progress Notes (Signed)
Speech Language Pathology Daily Session Note  Patient Details  Name: Andres [name] MRN: [id number] Date of Birth: 08-Aug-1940  Today's Date: 08/23/2019 SLP Individual Time: 0901-1001 SLP Individual Time Calculation (min): 60 min  Short Term Goals: Week 1: SLP Short Term Goal 1 (Week 1): Pt will consume therapeutic trials of thin liquids, ice chips, or purees with max assist multimodal cues for use of swallowing precautions and minimal overt s/s of aspiratin over 3 consecutive sessions prior to instrumental assessment. SLP Short Term Goal 2 (Week 1): Pt will focus his attention to stimuli in >25% of opportunities with max assist multimodal cues. SLP Short Term Goal 3 (Week 1): Pt will remain alert for >1 minute with max assist multimodal cues. SLP Short Term Goal 4 (Week 1): Pt will orient to place, date, and situation with max assist multimodal cues. SLP Short Term Goal 5 (Week 1): Pt will visually scan to the left of midline in >25% of opportunities during basic structured tasks with max assist multimodal cues.  Skilled Therapeutic Interventions: Pt was seen for skilled ST targeting dysphagia and cognitive goals. Pt continued to require Max A multimodal cues to achieve focused attention, arousal, and initiation for participation in [place] tasks. Total A provided for oral care via suction today, pt pulled his hand away when SLP attempted to provide hand over hand assist during oral care. Several attempts for pt to accept small ice chips and sips of thin H2O via straw attempted when pt was awake with eyes open, however he was unable to follow commands to open his mouth 90% opportunities and expectorated only ice chip that he did accept into oral cavity. Poor secretion management noted, as saliva noted to spill out of left side of oral cavity X2 during session, requiring suctioning by SLP. Suspect decreased frequency of swallowing (secondary to lethargy and inattention and left neglect) primarily  contributing to poor secretion management. Recommend continue NPO and ST will continue to provide opportunities for PO trials when alert. Throughout session pt followed 1-step directions for repositioning feet onto wheelchair foot rests ~50% of the time when provided Max A multimodal cues. SLP presented pt with calendar in attempt to assist with orientation and locating midline, however Total A required for orientation to time, place, situation. Pt initiated a verbal response to 4 questions over entire hour session, therefore verbal output continues to be limited, but pt stated his name and answered "yes ma'am" and "no ma'am" at times. He appeared uncomfortable and with heavy left lean in chair, therefore SLP provided Max A multimodal cues for repositioning to sitting upright in center of chair, however no carryover evidenced, as pt continued to push himself to left side repeatedly throughout session. Pt left sitting upright in tilt in space chair with PT present to receive pt. Continue per current plan of care.       Pain Pain Assessment Pain Scale: 0-10 Pain Score: 0-No pain  Therapy/Group: Individual Therapy  Arbutus Leas 08/23/2019, 10:06 AM

## 2019-08-23 NOTE — Progress Notes (Signed)
Occupational Therapy Session Note  Patient Details  Name: Andres [name] MRN: [id number] Date of Birth: Nov 23, 1940  Today's Date: 08/23/2019 OT Individual Time: [id number] OT Individual Time Calculation (min): 30 min    Short Term Goals: Week 1:  OT Short Term Goal 1 (Week 1): Pt will complete toilet or BSC transfer with 2 assist and LRAD OT Short Term Goal 2 (Week 1): Pt will initiate washing 1 body area when handed a wash cloth during bathing OT Short Term Goal 3 (Week 1): Pt will assist with threading L UE into a shirt with max vcs  Skilled Therapeutic Interventions/Progress Updates:    Upon entering the room, pt has NG feedings going and has positioned himself towards the bottom of the bed and in prone position. Feedings placed on hold and OT providing total A to roll into supine. Pt then reaching back to R rail and pulling himself back into prone position. Second helper needed as pt was resistive to movement to assist with repositioning pt in bed and bed placed into chair position. Pt appearing to look much better for tube feeding so they were turned back on at this time. OT observing pt in this position for 10 minutes for safety and notifying RN and NT. Mat placed on floor by bed for safety, bed lowered, bed alarm activated, and all four rails are up at this time. Pt does swing R hand with mitten donned attempting to remove during this session. Over pt remains lethargic and unable to actively participate.   Therapy Documentation Precautions:  Precautions Precautions: Fall Precaution Comments: Lt hemi Restrictions Weight Bearing Restrictions: No General:   Vital Signs: Therapy Vitals Temp: 98.1 F (36.7 C) Pulse Rate: 78 Resp: 20 BP: 125/75 Patient Position (if appropriate): Lying Oxygen Therapy SpO2: 93 % O2 Device: Room Air Pain:   ADL: ADL Eating: Not assessed Grooming: Dependent Where Assessed-Grooming: Bed level Upper Body Bathing: Dependent Where Assessed-Upper  Body Bathing: Edge of bed Lower Body Bathing: Dependent Where Assessed-Lower Body Bathing: Bed level Upper Body Dressing: Dependent Where Assessed-Upper Body Dressing: Edge of bed Lower Body Dressing: Dependent Where Assessed-Lower Body Dressing: Bed level Toileting: Not assessed Toilet Transfer: Not assessed Tub/Shower Transfer: Not assessed ADL Comments: Pt is dependent +2 assist during self care tasks excluding grooming (+1 assist)   Therapy/Group: Individual Therapy  Gypsy Decant 08/23/2019, 4:09 PM

## 2019-08-23 NOTE — Progress Notes (Signed)
Physical Therapy Session Note  Patient Details  Name: Andres [name] MRN: [id number] Date of Birth: 12-01-1940  Today's Date: 08/23/2019 PT Individual Time: 0805-0900 and 1002-1022 PT Individual Time Calculation (min): 55 minand 20 min   Short Term Goals: Week 1:  PT Short Term Goal 1 (Week 1): Pt will sit EOB with max assist of 1 person PT Short Term Goal 2 (Week 1): Pt will transfer to [facility] with max A +2 and stedy PT Short Term Goal 3 (Week 1): Pt will attend to L side 25% of the time during functional activity to improve safety awareness PT Short Term Goal 4 (Week 1): Pt will tolerate sitting up in WC 3 hours between therapies.  Skilled Therapeutic Interventions/Progress Updates: Pt presented in bed with MD presented for daily assessment. Pt continues to only respond to name and not oriented to place/situation. Pt appears more awake at beginning of this session. Pt continues to only respond intermittently to single step commands as initiated bed mobility. Pt would attempt to scoot up towards [place] in sidelying however limited due to poor sequencing and limited ability to follow commands. Attempted supine to sit however pt pulling onto bed rail vs pushing. One occurrence of pt initiating and using RUE to push up from mattress with PTA providing total A for truncal support however unable to come to full sitting. Pt then spent several minutes attempting to reposition self however unable to follow PTA's commands to reposition. Pt ultimately boosted to [facility] dependent x 2 with pt attempting to turn to side. Pt then performed rolling L/R maxA x 2 for [facility] sling placement. Pt transferred to [facility] chair via [facility]. Pt positioned in chair and mitten placed back on R hand. Pt left in in presence of SLP and with alarm belt in place.   Tx2:  Pt handed off to PTA from SLP. Pt initially appeared more lethargic but became more alert as PTA attempted to position pt. Pt set up to attempt squat pivot transfer to  bed however began pushing to L with heavy lateral lean that required +2 for correction. Deemed unsafe at current time to perform squat pivot transfer. Pt set up with [facility] and transferred to back to bed. Once in bed pt appeared more calm and nearly asleep. Pt left in bed with alarm on and mitten placed. Discussed with [facility] RN changing pt to high/low bed for pt's safety due to pt's restlessness and frequency of sliding down towards bottom of bed.      Therapy Documentation Precautions:  Precautions Precautions: Fall Precaution Comments: Lt hemi Restrictions Weight Bearing Restrictions: No General:   Vital Signs: Therapy Vitals Temp: 98.1 F (36.7 C) Pulse Rate: 78 Resp: 20 BP: 125/75 Patient Position (if appropriate): Lying Oxygen Therapy SpO2: 93 % O2 Device: Room Air Pain:   Mobility:   Locomotion :    Trunk/Postural Assessment :    Balance:   Exercises:   Other Treatments:      Therapy/Group: Individual Therapy  Josalyn Dettmann 08/23/2019, 4:36 PM

## 2019-08-23 NOTE — Plan of Care (Signed)
  Problem: Consults Goal: RH GENERAL PATIENT EDUCATION Description: See Patient Education module for education specifics. Outcome: Not Progressing Goal: Skin Care Protocol Initiated - if Braden Score 18 or less Description: If consults are not indicated, leave blank or document N/A Outcome: Not Progressing Goal: Nutrition Consult-if indicated Outcome: Not Progressing Goal: Diabetes Guidelines if Diabetic/Glucose > 140 Description: If diabetic or lab glucose is > 140 mg/dl - Initiate Diabetes/Hyperglycemia Guidelines & Document Interventions  Outcome: Not Progressing   Problem: RH BOWEL ELIMINATION Goal: RH STG MANAGE BOWEL WITH ASSISTANCE Description: STG Manage Bowel with mod Assistance. Outcome: Not Progressing Goal: RH STG MANAGE BOWEL W/MEDICATION W/ASSISTANCE Description: STG Manage Bowel with Medication with min Assistance. Outcome: Not Progressing   Problem: RH BLADDER ELIMINATION Goal: RH STG MANAGE BLADDER WITH ASSISTANCE Description: STG Manage Bladder With mod Assistance Outcome: Not Progressing   Problem: RH SKIN INTEGRITY Goal: RH STG SKIN FREE OF INFECTION/BREAKDOWN Description: No new breakdown prior to discharge Outcome: Not Progressing Goal: RH STG MAINTAIN SKIN INTEGRITY WITH ASSISTANCE Description: STG Maintain Skin Integrity With  mod Assistance. Outcome: Not Progressing   Problem: RH SAFETY Goal: RH STG ADHERE TO SAFETY PRECAUTIONS W/ASSISTANCE/DEVICE Description: STG Adhere to Safety Precautions With  mod Assistance/Device. Outcome: Not Progressing   Problem: RH PAIN MANAGEMENT Goal: RH STG PAIN MANAGED AT OR BELOW PT'S PAIN GOAL Description: Less than 4 Outcome: Not Progressing   Problem: RH KNOWLEDGE DEFICIT GENERAL Goal: RH STG INCREASE KNOWLEDGE OF SELF CARE AFTER HOSPITALIZATION Description: Patient 's family will be able to verbalize how to assist patient in self care prior to discharge Outcome: Not Progressing

## 2019-08-23 NOTE — Progress Notes (Signed)
Pt constantly scoots down in the bed despite repositioning. Spoke to PA about the concern with his tube feedings as he doesn't seem to tolerate 30 degrees very well.

## 2019-08-23 NOTE — Progress Notes (Addendum)
[facility] PHYSICAL MEDICINE & REHABILITATION PROGRESS NOTE  Subjective/Complaints:  No issues overnite, Has been lethargic, but is more awake this am   ROS: Unable to assess due to cognition/language  Objective: Vital Signs: Blood pressure (!) 154/93, pulse 79, temperature 98.1 F (36.7 C), resp. rate 18, weight 95.7 kg, SpO2 100 %. No results found. Recent Labs    08/22/19 0606  WBC 7.3  HGB 14.7  HCT 43.1  PLT 374   Recent Labs    08/22/19 0606  NA 136  K 5.0  CL 95*  CO2 28  GLUCOSE 157*  BUN 27*  CREATININE 1.18  CALCIUM 10.2    Physical Exam: BP (!) 154/93 (BP Location: Left Arm)   Pulse 79   Temp 98.1 F (36.7 C)   Resp 18   Wt 95.7 kg   SpO2 100%   BMI 33.04 kg/m  Constitutional: No distress . Vital signs reviewed.  Morbidly obese. HENT: Normocephalic.  Atraumatic.  + NG. Eyes: EOMI. No discharge. Cardiovascular: No JVD. Respiratory: Normal effort.  No stridor. GU scrotal edema, penile shaft not visualized, able to see meatus   GI: Non-distended. Skin: Warm and dry.  Intact. Psych: Unable to assess due to mentation. Musc: Lower extremity edema. Neurological: Alert Dysarthria Global aphasia, unchanged Motor: anitgravity movement on Right side as well as LLE, 0/5 LUE, but unable to follow commands for manual muscle testing Severe Left neglect does not cross midline   Assessment/Plan: 1. Functional deficits secondary to right MCA infarct which require 3+ hours per day of interdisciplinary therapy in a comprehensive inpatient rehab setting.  Physiatrist is providing close team supervision and 24 hour management of active medical problems listed below.  Physiatrist and rehab team continue to assess barriers to discharge/monitor patient progress toward functional and medical goals  Care Tool:  Bathing        Body parts bathed by helper: (whole body)     Bathing assist Assist Level: 2 Helpers     Upper Body Dressing/Undressing Upper  body dressing   What is the patient wearing?: Hospital gown only    Upper body assist Assist Level: Dependent - Patient 0%    Lower Body Dressing/Undressing Lower body dressing      What is the patient wearing?: Incontinence brief     Lower body assist Assist for lower body dressing: 2 Helpers     Toileting Toileting    Toileting assist Assist for toileting: 2 Helpers     Transfers Chair/bed transfer  Transfers assist     Chair/bed transfer assist level: Dependent - mechanical lift     Locomotion Ambulation   Ambulation assist   Ambulation activity did not occur: Safety/medical concerns          Walk 10 feet activity   Assist  Walk 10 feet activity did not occur: Safety/medical concerns        Walk 50 feet activity   Assist Walk 50 feet with 2 turns activity did not occur: Safety/medical concerns         Walk 150 feet activity   Assist Walk 150 feet activity did not occur: Safety/medical concerns         Walk 10 feet on uneven surface  activity   Assist Walk 10 feet on uneven surfaces activity did not occur: Safety/medical concerns         Wheelchair     Assist   Type of Wheelchair: Manual    Wheelchair assist level: Dependent - Patient  0% Max wheelchair distance: 150    Wheelchair 50 feet with 2 turns activity    Assist        Assist Level: Dependent - Patient 0%   Wheelchair 150 feet activity     Assist     Assist Level: Dependent - Patient 0%      Medical Problem List and Plan: 1.  Left side weakness with dysarthria and dysphagia secondary to acute right MCA infarction  Continue CIR PT,  OT ,SLP- 2.  Antithrombotics: -DVT/anticoagulation: Lovenox             -antiplatelet therapy: Aspirin 81 mg daily, Plavix 75 mg daily x3 months then Plavix alone 3. Pain Management: Voltaren gel as needed 4. Mood: Provide emotional support             -antipsychotic agents: N/A 5. Neuropsych: This patient  is not capable of making decisions on his own behalf. 6. Skin/Wound Care: Routine skin checks 7. Fluids/Electrolytes/Nutrition: Routine in and outs.     8.  Post stroke dysphagia.        N.p.o.             Nasogastric tube feeds.               Advance diet as tolerated 9.  Seizure disorder.  Continue Keppra 750 mg every 12 hours, EEG negative  No seizures since admission to rehab 10.  Essential hypertension.  Lopressor 25 mg twice daily   Vitals:   08/22/19 1955 08/23/19 0427  BP: 125/82 (!) 154/93  Pulse: 85 79  Resp: 18 18  Temp: 98.5 F (36.9 C) 98.1 F (36.7 C)  SpO2: 97% 100%    Monitor with increased mobility 11.  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  Hemoglobin A1c 6.2.  ?  Trending up, consider medications if persistently elevated             Monitor with increased mobility 12.  CKD stage III.              Creat normal this am BUN mildly elevated will increase free H2O 13.  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  Latest blood cultures question contaminant per infectious disease.  Ancef completed 08/19/2019.             Repeat blood culture no growth 14.  CAD with history of CABG 1997.  Continue aspirin and Plavix 15.  Hyperlipidemia: Continue Lipitor 16.  BPH.  Flomax 0.4 mg daily.               PVRs x1 performed, await further recordings 17.  Morbid obesity.  BMI 35.70.  Dietary follow-up 18.  Acute blood loss anemia  Hemoglobin 12.3 on 1/14, 14.7 on 1/18   Labs ordered for tomorrow 19.  Lethargy may be sleep wake cycle related, check CBC, BMET, CXR to r/o infx, if neg consider ritalin trial  Check sleep graph LOS: 4 days A FACE TO FACE EVALUATION WAS PERFORMED  [name] 08/23/2019, 8:01 AM

## 2019-08-23 NOTE — Progress Notes (Signed)
Pt slept better tonight when allowed to lay on his side with head elevated. Still tries to slide down in bed and could benefit from bolus feeds to avoid aspiration. Spoke with daughter overnight. She is concerned about his sleepiness during therapy sessions. Will pass on in report

## 2019-08-24 ENCOUNTER — Inpatient Hospital Stay (HOSPITAL_COMMUNITY): Payer: Medicare (Managed Care) | Admitting: Occupational Therapy

## 2019-08-24 ENCOUNTER — Inpatient Hospital Stay (HOSPITAL_COMMUNITY): Payer: Medicare (Managed Care) | Admitting: Physical Therapy

## 2019-08-24 ENCOUNTER — Inpatient Hospital Stay (HOSPITAL_COMMUNITY): Payer: Medicare (Managed Care) | Admitting: Speech Pathology

## 2019-08-24 LAB — BASIC METABOLIC PANEL
Anion gap: 11 (ref 5–15)
BUN: 30 mg/dL — ABNORMAL HIGH (ref 8–23)
CO2: 26 mmol/L (ref 22–32)
Calcium: 10.3 mg/dL (ref 8.9–10.3)
Chloride: 98 mmol/L (ref 98–111)
Creatinine, Ser: 1.2 mg/dL (ref 0.61–1.24)
GFR calc Af Amer: >60 mL/min (ref >60)
GFR calc non Af Amer: 57 mL/min — ABNORMAL LOW (ref >60)
Glucose, Bld: 130 mg/dL — ABNORMAL HIGH (ref 70–99)
Potassium: 4.7 mmol/L (ref 3.5–5.1)
Sodium: 135 mmol/L (ref 135–145)

## 2019-08-24 LAB — GLUCOSE, CAPILLARY
Glucose-Capillary: 105 mg/dL — ABNORMAL HIGH (ref 70–99)
Glucose-Capillary: 109 mg/dL — ABNORMAL HIGH (ref 70–99)
Glucose-Capillary: 119 mg/dL — ABNORMAL HIGH (ref 70–99)
Glucose-Capillary: 130 mg/dL — ABNORMAL HIGH (ref 70–99)
Glucose-Capillary: 156 mg/dL — ABNORMAL HIGH (ref 70–99)
Glucose-Capillary: 178 mg/dL — ABNORMAL HIGH (ref 70–99)
Glucose-Capillary: 63 mg/dL — ABNORMAL LOW (ref 70–99)
Glucose-Capillary: 82 mg/dL (ref 70–99)

## 2019-08-24 MED ORDER — METHYLPHENIDATE HCL 5 MG PO TABS
5.0000 mg | ORAL_TABLET | Freq: Two times a day (BID) | ORAL | Status: DC
  Administered 2019-08-24 – 2019-08-27 (×7): 5 mg via ORAL
  Filled 2019-08-24 (×7): qty 1

## 2019-08-24 NOTE — Patient Care Conference (Signed)
Inpatient RehabilitationTeam Conference and Plan of Care Update Date: 08/24/2019   Time: 10:00 AM    Patient Name: Andres [name]      Medical Record Number: [id number]  Date of Birth: 11-09-1940 Sex: Male         Room/Bed: 4W03C/4W03C-01 Payor Info: Payor: GENERIC MEDICARE ADVANTAGE / Plan: GENERIC MEDICARE ADVANTAGE / Product Type: *No Product type* /    Admit Date/Time:  08/19/2019  3:40 PM  Primary Diagnosis:  Right middle cerebral artery stroke [facility])  Patient Active Problem List   Diagnosis Date Noted  . Acute blood loss anemia   . Labile blood pressure   . Diabetes mellitus type 2 in obese ([place])   . Right middle cerebral artery stroke ([place]) 08/19/2019  . Seizures ([place])   . Stage 3 chronic kidney disease   . Dysphagia, post-stroke   . Coagulase negative Staphylococcus bacteremia 08/15/2019  . Acute ischemic stroke ([place]) 08/09/2019  . Transaminitis 08/09/2019  . History of seizures 08/09/2019  . Left hip pain 03/02/2019  . Syncope 09/05/2018  . Gout 07/10/2018  . Scrotal swelling 06/09/2018  . Urinary dribbling 06/03/2017  . Mass of scrotum 06/03/2017  . Injury of left hand 07/21/2015  . Hx of   sessile serrated colonic polyp 02/27/2015  . Chronic anticoagulation 12/22/2014  . Colon cancer screening 12/22/2014  . Morbid obesity ([place]) 09/29/2014  . Mitral valve prolapse 11/24/2013  . IT band syndrome 09/23/2013  . Hypotension, unspecified 07/21/2013  . Dyspnea 07/21/2013  . Elevated troponin I level- (pk Troponin 0.34 in setting of acute renal insufficency) 07/21/2013  . 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 Last nuc 2012 negative. 07/21/2013  . SBO (small bowel obstruction) ([place]) 07/21/2013  . Inguinal hernia, Rt reduced, Lt present 07/21/2013  . Acute renal failure ([place]) 07/21/2013  . Pneumonia, aspiration ([place]) 07/21/2013  . Obesity (BMI 30-39.9) 01/17/2013  . Osteoarthritis, hand, primary localized 08/12/2012  . Preventative health care 04/11/2011   . MILD COGNITIVE IMPAIRMENT SO STATED 10/03/2010  . CEREBROVASCULAR ACCIDENT, HX OF 10/03/2010  . RASH-NONVESICULAR 05/12/2008  . TRANSIENT ISCHEMIC ATTACK 01/27/2008  . Diabetes ([place]) 12/08/2007  . ALLERGIC RHINITIS 12/08/2007  . DIVERTICULOSIS, COLON 12/08/2007  . LOW BACK PAIN 12/08/2007  . NEPHROLITHIASIS, HX OF 12/08/2007  . BPH (benign prostatic hyperplasia) 04/05/2007  . Hyperlipidemia 04/02/2007  . CARPAL TUNNEL SYNDROME, BILATERAL 04/02/2007  . CATARACT NOS 04/02/2007  . Essential hypertension 04/02/2007    Expected Discharge Date: Expected Discharge Date: (TBD)  Team Members Present: Physician leading conference: Dr. Alysia Penna Social Worker Present: Lennart Pall, LCSW Nurse Present: Dorien Chihuahua, RN;Other (comment)(Blair Montanti, LPN) Case Manager: Karene Fry, RN PT Present: Phylliss Bob, PTA;[name], PT OT Present: Darleen Crocker, OT SLP Present: Charolett Bumpers, SLP PPS Coordinator present : Gunnar Fusi, SLP     Current Status/Progress Goal Weekly Team Focus  Bowel/Bladder   (P) Patient is incontinent of Bowel and Bladder  (P) To obtain continence of bowel and bladder  (P) PRN perineal care, time toileting   Swallow/Nutrition/ Hydration   NPO, poor secretion management, unable to maintain alterness appropriate for intake  Mod A least restrictive environment  maintaining arousal to participate in PO trials thin/puree, then pursue MBSS   ADL's   dependent - total A +2 - decreased level of alertness or very restless  mod A  alertness, NMR, self care retraining, balance, functional transfers, cognition   Mobility   MaxA x 2 bed mobility, maxA x2 squat  pivot, pusher's syndrome present  mod assist bed mobility and transfers at [facility] level.  OOB tolerance, sitting balance, transfers, d/c planning   Communication   Max A verbal output and intelligibility  Mod A  verbal responses to questions, use of speech intelligibility strategies   Safety/Cognition/  Behavioral Observations  Total A arousal, focused and sustained attention, basic problem solving  Mod A      Pain   (P) patient unable to express pain but frowns and groans  (P) decrease pain  (P) encourage to localize pain and q shift and PRN pain assessment   Skin   (P) Edema in scrotum and MASD in buttocks  (P) skin free from breakdown and infection  (P) q shift and PRN skin assessment    Rehab Goals Patient on target to meet rehab goals: Yes *See Care Plan and progress notes for long and short-term goals.     Barriers to Discharge  Current Status/Progress Possible Resolutions Date Resolved   Nursing                  PT  Inaccessible home environment;Decreased caregiver support;Medical stability;Home environment access/layout;Incontinence;Wound Care;Insurance for SNF coverage;Weight;Medication compliance;Behavior                 OT Lack of/limited family support;Medical stability;Incontinence;Insurance for SNF coverage;Behavior                SLP                SW Lack of/limited family support;Decreased caregiver support;Nutrition means Currently with a cortrak and tube feeds for nutrition            Discharge Planning/Teaching Needs:  Home with daughter (in [place]) who will hire caregivers to assist when she is at work  TBD   Team Discussion: L hemi, has movement on L side, monitoring labs.  RN non verbal, scrotom enlarged, incontinent, on TFs.  OT lethargic, tot A rolling, tot +2 transfers, incontinent.  PT max/tot +2 rolling, tot+2 sitting, pushing, using maximove, stood in parallel bars mod A, goals mod A w/c level, may need SNF at DC.  SLP NPO, trouble with secretions, mod A goals, comm max A, goals mod A comm, cognition tot A, goals mod A.  May need a PEG.  Has a dtr in [place].   Revisions to Treatment Plan: N/A     Medical Summary Current Status: Somnolent, on nasogastric tube feeding, moves left side spontaneously, minimal Weekly Focus/Goal: Consider PEG feedings  referral to IR  Barriers to Discharge: Pending Surgery;Nutrition means   Possible Resolutions to Barriers: See above, continue PT OT and speech therapy, may need to reassess swallowing   Continued Need for Acute Rehabilitation Level of Care: The patient requires daily medical management by a physician with specialized training in physical medicine and rehabilitation for the following reasons: Direction of a multidisciplinary physical rehabilitation program to maximize functional independence : Yes Medical management of patient stability for increased activity during participation in an intensive rehabilitation regime.: Yes Analysis of laboratory values and/or radiology reports with any subsequent need for medication adjustment and/or medical intervention. : Yes   I attest that I was present, lead the team conference, and concur with the assessment and plan of the team.   Retta Diones 08/24/2019, 12:06 PM   Team conference was held via web/ teleconference due to [place] - 19

## 2019-08-24 NOTE — Progress Notes (Signed)
Re-start tube feeding at 4:00 AM. Patient still restless and keep sliding down in bed. Writer stopped the tube feeding at 6:58 AM due to patient at risk of aspiration.

## 2019-08-24 NOTE — Progress Notes (Signed)
[place] PHYSICAL MEDICINE & REHABILITATION PROGRESS NOTE  Subjective/Complaints:  More awake not interactive or verbal spont movement on Left side   ROS: Unable to assess due to cognition/language  Objective: Vital Signs: Blood pressure (!) 154/81, pulse 75, temperature 98.3 F (36.8 C), resp. rate 17, weight 96.3 kg, SpO2 93 %. DG Chest 1 View  Result Date: 08/23/2019 CLINICAL DATA:  Lethargy. EXAM: CHEST  1 VIEW COMPARISON:  August 14, 2019 FINDINGS: A nasogastric tube is seen with its distal end extending below the level of the diaphragm. Multiple sternal wires and vascular clips are seen. There is no evidence of acute infiltrate, pleural effusion or pneumothorax. The heart size and mediastinal contours are within normal limits. The visualized skeletal structures are unremarkable. IMPRESSION: 1. Evidence of prior median sternotomy/CABG. 2. No acute or active cardiopulmonary disease. Electronically Signed   By: Virgina Norfolk M.D.   On: 08/23/2019 19:15   Recent Labs    08/22/19 0606 08/23/19 1025  WBC 7.3 8.3  HGB 14.7 15.1  HCT 43.1 44.4  PLT 374 491*   Recent Labs    08/22/19 0606 08/23/19 1025  NA 136 136  K 5.0 5.2*  CL 95* 97*  CO2 28 26  GLUCOSE 157* 132*  BUN 27* 30*  CREATININE 1.18 1.43*  CALCIUM 10.2 10.7*    Physical Exam: BP (!) 154/81   Pulse 75   Temp 98.3 F (36.8 C)   Resp 17   Wt 96.3 kg   SpO2 93%   BMI 33.25 kg/m  Constitutional: No distress . Vital signs reviewed.  Morbidly obese. HENT: Normocephalic.  Atraumatic.  + NG. Eyes: EOMI. No discharge. Cardiovascular: No JVD. Respiratory: Normal effort.  No stridor. GU scrotal edema, penile shaft not visualized, able to see meatus   GI: Non-distended. Skin: Warm and dry.  Intact. Psych: Unable to assess due to mentation. Musc: Lower extremity edema. Neurological: Alert Dysarthria Global aphasia, unchanged Motor: 3- Left shouder , hip flexor and knee ext, (spont movement not to  command ) Sensory no withdrawal to pinch  Severe Left neglect does not cross midline   Assessment/Plan: 1. Functional deficits secondary to right MCA infarct which require 3+ hours per day of interdisciplinary therapy in a comprehensive inpatient rehab setting.  Physiatrist is providing close team supervision and 24 hour management of active medical problems listed below.  Physiatrist and rehab team continue to assess barriers to discharge/monitor patient progress toward functional and medical goals  Care Tool:  Bathing        Body parts bathed by helper: (whole body)     Bathing assist Assist Level: 2 Helpers     Upper Body Dressing/Undressing Upper body dressing   What is the patient wearing?: Hospital gown only    Upper body assist Assist Level: Dependent - Patient 0%    Lower Body Dressing/Undressing Lower body dressing      What is the patient wearing?: Incontinence brief     Lower body assist Assist for lower body dressing: 2 Helpers     Toileting Toileting    Toileting assist Assist for toileting: 2 Helpers     Transfers Chair/bed transfer  Transfers assist     Chair/bed transfer assist level: Dependent - mechanical lift     Locomotion Ambulation   Ambulation assist   Ambulation activity did not occur: Safety/medical concerns          Walk 10 feet activity   Assist  Walk 10 feet activity did not  occur: Safety/medical concerns        Walk 50 feet activity   Assist Walk 50 feet with 2 turns activity did not occur: Safety/medical concerns         Walk 150 feet activity   Assist Walk 150 feet activity did not occur: Safety/medical concerns         Walk 10 feet on uneven surface  activity   Assist Walk 10 feet on uneven surfaces activity did not occur: Safety/medical concerns         Wheelchair     Assist   Type of Wheelchair: Manual    Wheelchair assist level: Dependent - Patient 0% Max wheelchair  distance: 150    Wheelchair 50 feet with 2 turns activity    Assist        Assist Level: Dependent - Patient 0%   Wheelchair 150 feet activity     Assist     Assist Level: Dependent - Patient 0%      Medical Problem List and Plan: 1.  Left side weakness with dysarthria and dysphagia secondary to acute right MCA infarction  Continue CIR PT,  OT ,SLP-Team conference today please see physician documentation under team conference tab, met with team face-to-face to discuss problems,progress, and goals. Formulized individual treatment plan based on medical history, underlying problem and comorbidities. 2.  Antithrombotics: -DVT/anticoagulation: Lovenox             -antiplatelet therapy: Aspirin 81 mg daily, Plavix 75 mg daily x3 months then Plavix alone 3. Pain Management: Voltaren gel as needed 4. Mood: Provide emotional support             -antipsychotic agents: N/A 5. Neuropsych: This patient is not capable of making decisions on his own behalf. 6. Skin/Wound Care: Routine skin checks 7. Fluids/Electrolytes/Nutrition: Routine in and outs.     8.  Post stroke dysphagia.        N.p.o.             Nasogastric tube feeds.               Advance diet as tolerated 9.  Seizure disorder.  Continue Keppra 750 mg every 12 hours, EEG negative  No seizures since admission to rehab 10.  Essential hypertension.  Lopressor 25 mg twice daily   Vitals:   08/23/19 2007 08/24/19 0300  BP: (!) 158/91 (!) 154/81  Pulse: 79 75  Resp: 19 17  Temp: 97.9 F (36.6 C) 98.3 F (36.8 C)  SpO2: 93%     Monitor with increased mobility 11.  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  Hemoglobin A1c 6.2.  ?  Trending up, consider medications if persistently elevated             Monitor with increased mobility 12.  CKD stage III.              Creat normal this am BUN mildly elevated will increase free H2O 13.  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  Latest  blood cultures question contaminant per infectious disease.  Ancef completed 08/19/2019.             Repeat blood culture no growth 14.  CAD with history of CABG 1997.  Continue aspirin and Plavix 15.  Hyperlipidemia: Continue Lipitor 16.  BPH.  Flomax 0.4 mg daily.               incont with low residuals  17.  Morbid obesity.  BMI 35.70.  Dietary follow-up 18.  Acute blood loss anemia  Hemoglobin 12.3 on 1/14, 14.7 on 1/18   Labs ordered for tomorrow 19.  Lethargy may be sleep wake cycle related, unremarkable CBC, BMET, CXR  neg start ritalin trial  Check sleep graph LOS: 5 days A FACE TO FACE EVALUATION WAS PERFORMED  Andres [name] 08/24/2019, 7:53 AM

## 2019-08-24 NOTE — Progress Notes (Signed)
Occupational Therapy Session Note  Patient Details  Name: Andres [name] MRN: [id number] Date of Birth: Dec 11, 1940  Today's Date: 08/24/2019 OT Individual Time: 1520-1545 OT Individual Time Calculation (min): 25 min  15 missed minutes secondary to lethargy  Short Term Goals: Week 1:  OT Short Term Goal 1 (Week 1): Pt will complete toilet or BSC transfer with 2 assist and LRAD OT Short Term Goal 2 (Week 1): Pt will initiate washing 1 body area when handed a wash cloth during bathing OT Short Term Goal 3 (Week 1): Pt will assist with threading L UE into a shirt with max vcs  Skilled Therapeutic Interventions/Progress Updates:    Upon entering the room, pt sleeping soundly and has moved down to the middle of the bed therefore feedings stopped for safety. Pt moving to R side of room and attempted to alert pt for OT intervention. Pt turning towards therapist and then striking out several times and hitting therapist. OT removed R hand mit to encourage pt to attempt to use it functional but then he perseverated on trying to get hand into soiled brief. OT rolling pt L <> R with total A for hygiene and clothing management. R hand mitt donned for safety. Once pt is cleaned and repositioned he is asleep again and OT unable to alert for therapeutic intervention. Bed lowered and bed alarm activated.   Therapy Documentation Precautions:  Precautions Precautions: Fall Precaution Comments: Lt hemi Restrictions Weight Bearing Restrictions: No General: General OT Amount of Missed Time: 15 Minutes Vital Signs: Therapy Vitals Temp: 97.8 F (36.6 C) Pulse Rate: 81 Resp: 18 BP: (!) 132/92 Patient Position (if appropriate): Lying Oxygen Therapy SpO2: 100 % O2 Device: Room Air Pain:   ADL: ADL Eating: Not assessed Grooming: Dependent Where Assessed-Grooming: Bed level Upper Body Bathing: Dependent Where Assessed-Upper Body Bathing: Edge of bed Lower Body Bathing: Dependent Where  Assessed-Lower Body Bathing: Bed level Upper Body Dressing: Dependent Where Assessed-Upper Body Dressing: Edge of bed Lower Body Dressing: Dependent Where Assessed-Lower Body Dressing: Bed level Toileting: Not assessed Toilet Transfer: Not assessed Tub/Shower Transfer: Not assessed ADL Comments: Pt is dependent +2 assist during self care tasks excluding grooming (+1 assist)   Therapy/Group: Individual Therapy  Gypsy Decant 08/24/2019, 4:26 PM

## 2019-08-24 NOTE — Plan of Care (Signed)
  Problem: Consults Goal: RH GENERAL PATIENT EDUCATION Description: See Patient Education module for education specifics. Outcome: Progressing Goal: Skin Care Protocol Initiated - if Braden Score 18 or less Description: If consults are not indicated, leave blank or document N/A Outcome: Progressing Goal: Nutrition Consult-if indicated Outcome: Progressing Goal: Diabetes Guidelines if Diabetic/Glucose > 140 Description: If diabetic or lab glucose is > 140 mg/dl - Initiate Diabetes/Hyperglycemia Guidelines & Document Interventions  Outcome: Progressing   Problem: RH PAIN MANAGEMENT Goal: RH STG PAIN MANAGED AT OR BELOW PT'S PAIN GOAL Description: Less than 4 Outcome: Progressing   Problem: RH KNOWLEDGE DEFICIT GENERAL Goal: RH STG INCREASE KNOWLEDGE OF SELF CARE AFTER HOSPITALIZATION Description: Patient 's family will be able to verbalize how to assist patient in self care prior to discharge Outcome: Progressing

## 2019-08-24 NOTE — Significant Event (Signed)
Hypoglycemic Event  CBG: 63 at 8:20 PM  Treatment: Re-start tube feeding at 8:20 PM and administer 150 cc of Ensure Enlive  Symptoms: None  Follow-up CBG: 8:49 PM  CBG Result:105  Possible Reasons for Event: Pausing tube feeding due to patient restlessness in bed can cause high risk of aspiration  Comments/MD notified: notified on-call Zella Ball, MD about the event    [place]

## 2019-08-24 NOTE — Progress Notes (Signed)
Patient beginning of the shift constantly sliding down in the bed and restless. Due to risk of possible aspiration, writer still not running the tube feeding. Daughter called around 8 PM to check patient and requested to speak with the patient before bedtime. Patient was able to say "hello" during phone conversation with daughter. Writer made daughter aware that patient tube feeding is currently not running due to patient keep sliding down in the bed that make patient at high risk of aspiration. Daughter agreed and was reassured by Probation officer to run tube feeding again once patient is resting.

## 2019-08-24 NOTE — Progress Notes (Signed)
Speech Language Pathology Daily Session Note  Patient Details  Name: Andres [name] MRN: [id number] Date of Birth: 03-Apr-1941  Today's Date: 08/24/2019 SLP Individual Time: 0830-0859 SLP Individual Time Calculation (min): 29 min  Short Term Goals: Week 1: SLP Short Term Goal 1 (Week 1): Pt will consume therapeutic trials of thin liquids, ice chips, or purees with max assist multimodal cues for use of swallowing precautions and minimal overt s/s of aspiratin over 3 consecutive sessions prior to instrumental assessment. SLP Short Term Goal 2 (Week 1): Pt will focus his attention to stimuli in >25% of opportunities with max assist multimodal cues. SLP Short Term Goal 3 (Week 1): Pt will remain alert for >1 minute with max assist multimodal cues. SLP Short Term Goal 4 (Week 1): Pt will orient to place, date, and situation with max assist multimodal cues. SLP Short Term Goal 5 (Week 1): Pt will visually scan to the left of midline in >25% of opportunities during basic structured tasks with max assist multimodal cues.   Skilled Therapeutic Interventions: Pt was seen for skilled ST targeting swallow and cognitive goals. Pt still with persistent lethargy today and unable to participate in oral care via suction - SLP provided Total A. Pt repositioned to upright in bed X3 however continues to slide himself down and resist care to reposition for optimal positioning for PO trials. During fleeting moments of alertness and when sitting upright ice chip trials attempted, however pursed lips together and/or expectorated any PO that met oral cavity. Given pt's tendency to reposition himself to laying <30% angle, he is also at high risk of aspirating NG tube feeds. More permanent means of nutrition may need to be explored if pt cannot better maintain altertness and participate in PO trials. Pt also mostly nonverbal throughout session, however answered 2 questions with "yes ma'am." Total A for orientation to place and  situation provided. Max A for focused attention also required throughout tasks. Pt eventually unable maintain alertness for even fleeting moments, and could not maintain upright positioning in bed, therefore session ended 30 minutes early. Pt left laying in bed with alarm set. Continue per current plan of care.         Pain Pain Assessment Pain Scale: Faces Pain Score: 0-No pain Faces Pain Scale: No hurt  Therapy/Group: Individual Therapy  Arbutus Leas 08/24/2019, 11:10 AM

## 2019-08-24 NOTE — Progress Notes (Signed)
Physical Therapy Session Note  Patient Details  Name: Andres [name] MRN: [id number] Date of Birth: 1941-06-24  Today's Date: 08/24/2019 PT Individual Time: 1030-1115 AND 1155-1250 PT Individual Time Calculation (min): 45 min and 55 min   Short Term Goals: Week 1:  PT Short Term Goal 1 (Week 1): Pt will sit EOB with max assist of 1 person PT Short Term Goal 2 (Week 1): Pt will transfer to [facility] with max A +2 and stedy PT Short Term Goal 3 (Week 1): Pt will attend to L side 25% of the time during functional activity to improve safety awareness PT Short Term Goal 4 (Week 1): Pt will tolerate sitting up in WC 3 hours between therapies.  Skilled Therapeutic Interventions/Progress Updates:   Session 1. Pt received supine in bed and agreeable to PT. Rolling to the R after 2 attempts with mod assist. Supine>sit transfer with assist and total A and max cues to prevent pushing through R UE to the L once in sitting. Sitting balance EOB x 4 minutes with max assist from PT to maintain midline and improve RUE placement to minimize pushers syndrome. Squat pivot transfer to [facility] with max A+2 on the L side. Sit<>stand in stedy with max A+2 x 3 bouts. Able to tolerate ~30-45 seconds in standing. Max multimodal cues for initiation of movement and max assist once in standing to prevent lateral fall LOB to the R from pushers syndrome. Noted adduction tone in the RLE limits safety of sustain standing balance. Patient returned to RN station and left sitting in [facility], full lap tray, safety belt, and all needs met. RN and NT aware of pt position.    Session 2.  Pt received sitting in WC at RN station. PT attempted to have pt participate in reciprocal movement training on kinetron, but unable to position pt appropriately due to width of WC. Transported to rehab gym. Sit<>stand in parallel bars x 4 with max assist + 2 to facilitate initiation of movement, improved LE positioning, and to maintain midline with Weight bearing  through the RUE. Pt also performed 2 bouts of gait training in parallel bars x 5 ft and 12f with max A +2 for close WC follow, tube feed management, advancement of the LLE with mod-max assist to weight shift R to off load L foot. Pt able to initiate 50 % of steps on 1st bout and only 25% of the R foot only on the L.  Attempted to have pt performed sustained attention and 1 step command following task to press target on dynavision. PT performed all reaching to target through, as pt gaze too far right to the see targets. Mod-max assist for sitting balance to prevent L LOB and to prevent hitting head on board, with forward LOB. Pt noted to maintain balance ~2 minutes with Bil elbows resting on knees with only min assist between bouts. Pt returned to room and performed squat pivot transfer to bed with total A + 2 to initiate movement, prevent pushing to the L, and for proper LE placement to maximize safety in transfer. Once EOB. Max A+2 for posterior scooting using bobath technique to force anterior weight shift and posterior gluteal translation. Sit>supine completed with total A + 2 and left supine in bed with call bell in reach and all needs met.        Therapy Documentation Precautions:  Precautions Precautions: Fall Precaution Comments: Lt hemi Restrictions Weight Bearing Restrictions: No   Pain: Pain Assessment Pain Scale: Faces  Faces Pain Scale: No hurt    Therapy/Group: Individual Therapy  Lorie Phenix 08/24/2019, 12:29 PM

## 2019-08-25 ENCOUNTER — Inpatient Hospital Stay (HOSPITAL_COMMUNITY): Payer: Medicare (Managed Care) | Admitting: Occupational Therapy

## 2019-08-25 ENCOUNTER — Inpatient Hospital Stay (HOSPITAL_COMMUNITY): Payer: Medicare (Managed Care) | Admitting: Physical Therapy

## 2019-08-25 ENCOUNTER — Inpatient Hospital Stay (HOSPITAL_COMMUNITY): Payer: Medicare (Managed Care) | Admitting: Speech Pathology

## 2019-08-25 LAB — GLUCOSE, CAPILLARY
Glucose-Capillary: 134 mg/dL — ABNORMAL HIGH (ref 70–99)
Glucose-Capillary: 178 mg/dL — ABNORMAL HIGH (ref 70–99)
Glucose-Capillary: 189 mg/dL — ABNORMAL HIGH (ref 70–99)
Glucose-Capillary: 139 mg/dL — ABNORMAL HIGH (ref 70–99)
Glucose-Capillary: 118 mg/dL — ABNORMAL HIGH (ref 70–99)

## 2019-08-25 MED ORDER — OSMOLITE 1.5 CAL PO LIQD
237.0000 mL | Freq: Every day | ORAL | Status: DC
  Administered 2019-08-25 – 2019-09-02 (×40): 237 mL
  Filled 2019-08-25 (×16): qty 237

## 2019-08-25 NOTE — Progress Notes (Signed)
Nutrition Follow-up  RD working remotely.  DOCUMENTATION CODES:   Obesity unspecified  INTERVENTION:   Transition to bolus tube feeds via Cortrak: - 1 carton (237 ml) of Osmolite 1.5 cal formula 5 X daily - Pro-stat 30 ml daily - Free water per MD/PA, currently 100 ml TID  Bolus tube feeding regimen provides 1878 kcal, 89 grams of protein, and 1203 ml of H2O.   NUTRITION DIAGNOSIS:   Inadequate oral intake related to inability to eat, lethargy/confusion as evidenced by NPO status.  Ongoing, being addressed via TF  GOAL:   Patient will meet greater than or equal to 90% of their needs  Met via TF  MONITOR:   TF tolerance, Labs, Weight trends, I & O's  REASON FOR ASSESSMENT:   Consult Enteral/tube feeding initiation and management  ASSESSMENT:   79 year old right-handed male with history of hypertension, CKD stage III, morbid obesity with BMI 35.70, hyperlipidemia, history of seizure maintained on Keppra, CAD status post CABG, history of CVA maintained on aspirin 81 mg daily and Plavix, carotid artery stenosis, mitral valve prolapse, diet-controlled diabetes mellitus, BPH and remote tobacco abuse. Admitted to rehab, s/p acute rt MCA infarct surroudning of R MCA parietal infarct, d/t right M1 occulusion.  Noted tube feedings have been periodically paused given pt's restlessness in bed and inability to maintain 30 degrees.  Cortrak remains in place.  Pt with a 5 lb weight loss since admission to CIR. Suspect weight loss related to pt not receiving full tube feeding regimen.  Per RN edema assessment, pt with edema to BUE and perineal and sacral regions.  Discussed pt with PA. Will transition to bolus tube feeds via Cortrak. Discussed plan with RN.  Current TF: Jevity 1.2 @ 65 ml/hr, Pro-stat 30 ml daily, free water 100 ml TID  Medications reviewed and include: SSI q 4 hours, Ritalin  Labs reviewed. CBG's: 63-178 x 24 hours  Diet Order:   Diet Order           Diet NPO time specified  Diet effective now              EDUCATION NEEDS:   No education needs have been identified at this time  Skin:  Skin Assessment: Reviewed RN Assessment  Last BM:  08/22/19 large type 6  Height:   Ht Readings from Last 1 Encounters:  08/10/19 5' 7"  (1.702 m)    Weight:   Wt Readings from Last 1 Encounters:  08/25/19 97.6 kg    Ideal Body Weight:  67.27 kg  BMI:  Body mass index is 33.7 kg/m.  Estimated Nutritional Needs:   Kcal:  1850-2050  Protein:  90-105g  Fluid:  2L/day    Gaynell Face, MS, RD, LDN Inpatient Clinical Dietitian Pager: [phone] Weekend/After Hours: [phone]

## 2019-08-25 NOTE — Plan of Care (Signed)
  Problem: Consults Goal: RH GENERAL PATIENT EDUCATION Description: See Patient Education module for education specifics. Outcome: Progressing Goal: Skin Care Protocol Initiated - if Braden Score 18 or less Description: If consults are not indicated, leave blank or document N/A Outcome: Progressing Goal: Nutrition Consult-if indicated Outcome: Progressing   Problem: RH SKIN INTEGRITY Goal: RH STG MAINTAIN SKIN INTEGRITY WITH ASSISTANCE Description: STG Maintain Skin Integrity With  mod Assistance. Outcome: Progressing

## 2019-08-25 NOTE — Progress Notes (Signed)
Speech Language Pathology Daily Session Note  Patient Details  Name: Andres [name] MRN: [id number] Date of Birth: 09/14/1940  Today's Date: 08/25/2019 SLP Individual Time: 1020-1050 SLP Individual Time Calculation (min): 30 min  Short Term Goals: Week 1: SLP Short Term Goal 1 (Week 1): Pt will consume therapeutic trials of thin liquids, ice chips, or purees with max assist multimodal cues for use of swallowing precautions and minimal overt s/s of aspiratin over 3 consecutive sessions prior to instrumental assessment. SLP Short Term Goal 2 (Week 1): Pt will focus his attention to stimuli in >25% of opportunities with max assist multimodal cues. SLP Short Term Goal 3 (Week 1): Pt will remain alert for >1 minute with max assist multimodal cues. SLP Short Term Goal 4 (Week 1): Pt will orient to place, date, and situation with max assist multimodal cues. SLP Short Term Goal 5 (Week 1): Pt will visually scan to the left of midline in >25% of opportunities during basic structured tasks with max assist multimodal cues.  Skilled Therapeutic Interventions:  Skilled treatment session focused on cognition goals, specifically focused attention, arousal. SLP recieved asleep in bed. SLP provided multimodal stimulation to improve arousal but unfortunately, pt was not able to arouse. Pt grunted x 1 but didn't open eyes or acknowledge stimulation. During this session, pt was not appropriate for PO trials d/t decreased mentation. Pt left upright in bed, bed alarm on and all needs within reach.      Pain    Therapy/Group: Individual Therapy  Andres [name] 08/25/2019, 12:38 PM

## 2019-08-25 NOTE — Progress Notes (Signed)
Patient well rested all night. Decreased restlessness by playing relaxing music, lights off, and repositioning in bed. Patient only wake up when incontinent brief needs to be change. Tube feeding continuously infusing since last night at 8:20 PM.

## 2019-08-25 NOTE — Progress Notes (Signed)
Occupational Therapy Session Note  Patient Details  Name: Andres [name] MRN: [id number] Date of Birth: 11-03-1940  Today's Date: 08/25/2019 OT Individual Time: 1400-1410 OT Individual Time Calculation (min): 10 min  50 missed minutes secondary to lethargy  Short Term Goals: Week 1:  OT Short Term Goal 1 (Week 1): Pt will complete toilet or BSC transfer with 2 assist and LRAD OT Short Term Goal 2 (Week 1): Pt will initiate washing 1 body area when handed a wash cloth during bathing OT Short Term Goal 3 (Week 1): Pt will assist with threading L UE into a shirt with max vcs  Skilled Therapeutic Interventions/Progress Updates:    Upon entering the room, pt supine in bed and sleeping soundly with RN present in the room. Total A +2 to roll and reposition pt in bed for comfort and propped to protect the hemiplegic side. Multiple attempts made to arouse pt for OT intervention. Pt opening eyes once but does not focus, pt does not answer with any type of sound, and does not move from painful stimuli. Pt missing 50 minutes secondary to lethargy. Bed alarm activated and mat placed on floor for safety. 50 missed minutes secondary to lethargy.   Therapy Documentation Precautions:  Precautions Precautions: Fall Precaution Comments: Lt hemi Restrictions Weight Bearing Restrictions: No General: General OT Amount of Missed Time: 50 Minutes Vital Signs: Therapy Vitals Temp: 98.5 F (36.9 C) Pulse Rate: (!) 47 BP: (!) 150/97 Oxygen Therapy SpO2: 96 % O2 Device: Room Air Pain:   ADL: ADL Eating: Not assessed Grooming: Dependent Where Assessed-Grooming: Bed level Upper Body Bathing: Dependent Where Assessed-Upper Body Bathing: Edge of bed Lower Body Bathing: Dependent Where Assessed-Lower Body Bathing: Bed level Upper Body Dressing: Dependent Where Assessed-Upper Body Dressing: Edge of bed Lower Body Dressing: Dependent Where Assessed-Lower Body Dressing: Bed level Toileting: Not  assessed Toilet Transfer: Not assessed Tub/Shower Transfer: Not assessed ADL Comments: Pt is dependent +2 assist during self care tasks excluding grooming (+1 assist)   Therapy/Group: Individual Therapy  Gypsy Decant 08/25/2019, 2:14 PM

## 2019-08-25 NOTE — Progress Notes (Signed)
[place] PHYSICAL MEDICINE & REHABILITATION PROGRESS NOTE  Subjective/Complaints: Non verbal was up OOB with PT, in recliner chair as well as Hoyer lift  ROS: Unable to assess due to cognition/language  Objective: Vital Signs: Blood pressure 131/69, pulse 98, temperature 98.1 F (36.7 C), resp. rate 18, weight 97.6 kg, SpO2 100 %. DG Chest 1 View  Result Date: 08/23/2019 CLINICAL DATA:  Lethargy. EXAM: CHEST  1 VIEW COMPARISON:  August 14, 2019 FINDINGS: A nasogastric tube is seen with its distal end extending below the level of the diaphragm. Multiple sternal wires and vascular clips are seen. There is no evidence of acute infiltrate, pleural effusion or pneumothorax. The heart size and mediastinal contours are within normal limits. The visualized skeletal structures are unremarkable. IMPRESSION: 1. Evidence of prior median sternotomy/CABG. 2. No acute or active cardiopulmonary disease. Electronically Signed   By: Virgina Norfolk M.D.   On: 08/23/2019 19:15   Recent Labs    08/23/19 1025  WBC 8.3  HGB 15.1  HCT 44.4  PLT 491*   Recent Labs    08/23/19 1025 08/24/19 0815  NA 136 135  K 5.2* 4.7  CL 97* 98  CO2 26 26  GLUCOSE 132* 130*  BUN 30* 30*  CREATININE 1.43* 1.20  CALCIUM 10.7* 10.3    Physical Exam: BP 131/69   Pulse 98   Temp 98.1 F (36.7 C)   Resp 18   Wt 97.6 kg   SpO2 100%   BMI 33.70 kg/m  Constitutional: No distress . Vital signs reviewed.  Morbidly obese. HENT: Normocephalic.  Atraumatic.  + NG. Eyes: EOMI. No discharge. Cardiovascular: No JVD. Respiratory: Normal effort.  No stridor. GU scrotal edema, penile shaft not visualized, able to see meatus   GI: Non-distended. Skin: Warm and dry.  Intact. Psych: lethargy  Musc: Lower extremity edema. Neurological: Alert Dysarthria Global aphasia, unchanged Motor: 3- Left shouder , hip flexor and knee ext, (spont movement not to command ) Sensory no withdrawal to pinch  Severe Left neglect  does not cross midline   Assessment/Plan: 1. Functional deficits secondary to right MCA infarct which require 3+ hours per day of interdisciplinary therapy in a comprehensive inpatient rehab setting.  Physiatrist is providing close team supervision and 24 hour management of active medical problems listed below.  Physiatrist and rehab team continue to assess barriers to discharge/monitor patient progress toward functional and medical goals  Care Tool:  Bathing        Body parts bathed by helper: (whole body)     Bathing assist Assist Level: 2 Helpers     Upper Body Dressing/Undressing Upper body dressing   What is the patient wearing?: Hospital gown only    Upper body assist Assist Level: Dependent - Patient 0%    Lower Body Dressing/Undressing Lower body dressing      What is the patient wearing?: Incontinence brief     Lower body assist Assist for lower body dressing: 2 Helpers     Toileting Toileting    Toileting assist Assist for toileting: 2 Helpers     Transfers Chair/bed transfer  Transfers assist     Chair/bed transfer assist level: Dependent - mechanical lift     Locomotion Ambulation   Ambulation assist   Ambulation activity did not occur: Safety/medical concerns          Walk 10 feet activity   Assist  Walk 10 feet activity did not occur: Safety/medical concerns  Walk 50 feet activity   Assist Walk 50 feet with 2 turns activity did not occur: Safety/medical concerns         Walk 150 feet activity   Assist Walk 150 feet activity did not occur: Safety/medical concerns         Walk 10 feet on uneven surface  activity   Assist Walk 10 feet on uneven surfaces activity did not occur: Safety/medical concerns         Wheelchair     Assist   Type of Wheelchair: Manual    Wheelchair assist level: Dependent - Patient 0% Max wheelchair distance: 150    Wheelchair 50 feet with 2 turns  activity    Assist        Assist Level: Dependent - Patient 0%   Wheelchair 150 feet activity     Assist     Assist Level: Dependent - Patient 0%      Medical Problem List and Plan: 1.  Left side weakness with dysarthria and dysphagia secondary to acute right MCA infarction  Continue CIR PT,  OT ,SLP-limited goals Mod A, may need SNF 2.  Antithrombotics: -DVT/anticoagulation: Lovenox             -antiplatelet therapy: Aspirin 81 mg daily, Plavix 75 mg daily x3 months then Plavix alone 3. Pain Management: Voltaren gel as needed 4. Mood: Provide emotional support             -antipsychotic agents: N/A 5. Neuropsych: This patient is not capable of making decisions on his own behalf. 6. Skin/Wound Care: Routine skin checks 7. Fluids/Electrolytes/Nutrition: Routine in and outs.     8.  Post stroke dysphagia.        N.p.o.             Nasogastric tube feeds.               Advance diet as tolerated 9.  Seizure disorder.  Continue Keppra 750 mg every 12 hours, EEG negative  No seizures since admission to rehab 10.  Essential hypertension.  Lopressor 25 mg twice daily   Vitals:   08/24/19 1951 08/25/19 0412  BP: (!) 143/74 131/69  Pulse: 78 98  Resp: 17 18  Temp: 98.3 F (36.8 C) 98.1 F (36.7 C)  SpO2:  100%  Hold BB for HR < 60, BP controlled   Monitor with increased mobility 11.  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  Hemoglobin A1c 6.2.  ?  Trending up, consider medications if persistently elevated             Monitor with increased mobility 12.  CKD stage III.              Creat normal this am BUN mildly elevated will increase free H2O 13.  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  Latest blood cultures question contaminant per infectious disease.  Ancef completed 08/19/2019.             Repeat blood culture no growth 14.  CAD with history of CABG 1997.  Continue aspirin and Plavix 15.  Hyperlipidemia: Continue Lipitor 16.   BPH.  Flomax 0.4 mg daily.               incont with low residuals  17.  Morbid obesity.  BMI 35.70.  Dietary follow-up 18.  Acute blood loss anemia  Hemoglobin 12.3 on 1/14, 14.7 on 1/18   Labs ordered for tomorrow 19.  Lethargy may be sleep  wake cycle related, unremarkable CBC, BMET, CXR  neg start ritalin trial  Check sleep graph LOS: 6 days A FACE TO FACE EVALUATION WAS PERFORMED  [name] 08/25/2019, 8:08 AM

## 2019-08-25 NOTE — Progress Notes (Signed)
Physical Therapy Session Note  Patient Details  Name: Andres [name] MRN: [id number] Date of Birth: 1941-01-18  Today's Date: 08/25/2019 PT Individual Time: 0800-0900 PT Individual Time Calculation (min): 60 min   Short Term Goals: Week 1:  PT Short Term Goal 1 (Week 1): Pt will sit EOB with max assist of 1 person PT Short Term Goal 2 (Week 1): Pt will transfer to [facility] with max A +2 and stedy PT Short Term Goal 3 (Week 1): Pt will attend to L side 25% of the time during functional activity to improve safety awareness PT Short Term Goal 4 (Week 1): Pt will tolerate sitting up in WC 3 hours between therapies.  Skilled Therapeutic Interventions/Progress Updates:   Pt received supine in bed, aroused to voice slightly, but eyes closing when therapist not calling pt by name. Pt noted ot have been mildly incontinent, rolling R and L with max A of 1 person to remove soiled brief, don clean brief and place maxi move sling.  Maxi move transfer to [facility]. PT treatment focused on improved arousal and initiation of movement. BP taken in WC 174/58. HR 88. Attempted to have pt reach to target, attend to ball coming from L or midline, pt able to react slightly on first attempt to grab ball sitting on lap, but then had no reaction to stationary or moving target with stimuli to UE, trunk or BLE. Pt increased to upright sitting position. Mild increase in arousal that last ~ 30 seconds. Pt then keeping eyes closed with only reacting to painful stimuli throughout rest of treatment, with no initiation of movement with the exception of occasion pushing behaviours when RUE in contact with arm rest of WC. Total A to reposition in WC to prevent  L lateral push. . BP re assessed 135/95, HR 77. PROM for LUE movement with no change in attention to task or arousal. Pt returned to room and performed maxi movetransfer to bed. Rolling R and L with max assist for initiation to remove sling, then Pt left supine in bed with call bell in  reach and all needs met.           Therapy Documentation Precautions:  Precautions Precautions: Fall Precaution Comments: Lt hemi Restrictions Weight Bearing Restrictions: No    Pain: Pain Assessment Pain Scale: Faces Pain Score: 0-No pain    Therapy/Group: Individual Therapy  Lorie Phenix 08/25/2019, 9:34 AM

## 2019-08-25 NOTE — Progress Notes (Signed)
Team Conference Report to Patient/Family  Team Conference discussion was reviewed with the daughter: Andres [name], including goals, any changes in plan of care and no target discharge date set yet and tentative goals set for MOD A overall/wheelchair level however given limited progress, goals may need to be downgraded.  Patient's caregiver expressed understanding. Has concerns about current status and functional limitations. Plans to come up from [place] tomorrow to review information more in depth and see what the patient is doing. Andres [name] 08/25/2019, 11:38 AM

## 2019-08-26 ENCOUNTER — Inpatient Hospital Stay (HOSPITAL_COMMUNITY): Payer: Medicare (Managed Care) | Admitting: Physical Therapy

## 2019-08-26 ENCOUNTER — Inpatient Hospital Stay (HOSPITAL_COMMUNITY): Payer: Medicare (Managed Care) | Admitting: Occupational Therapy

## 2019-08-26 ENCOUNTER — Inpatient Hospital Stay (HOSPITAL_COMMUNITY): Payer: Medicare (Managed Care) | Admitting: Speech Pathology

## 2019-08-26 ENCOUNTER — Inpatient Hospital Stay (HOSPITAL_COMMUNITY): Payer: Medicare (Managed Care)

## 2019-08-26 LAB — CREATININE, SERUM
Creatinine, Ser: 1.44 mg/dL — ABNORMAL HIGH (ref 0.61–1.24)
GFR calc Af Amer: 53 mL/min — ABNORMAL LOW (ref >60)
GFR calc non Af Amer: 46 mL/min — ABNORMAL LOW (ref >60)

## 2019-08-26 LAB — GLUCOSE, CAPILLARY
Glucose-Capillary: 105 mg/dL — ABNORMAL HIGH (ref 70–99)
Glucose-Capillary: 120 mg/dL — ABNORMAL HIGH (ref 70–99)
Glucose-Capillary: 135 mg/dL — ABNORMAL HIGH (ref 70–99)
Glucose-Capillary: 151 mg/dL — ABNORMAL HIGH (ref 70–99)
Glucose-Capillary: 157 mg/dL — ABNORMAL HIGH (ref 70–99)
Glucose-Capillary: 182 mg/dL — ABNORMAL HIGH (ref 70–99)
Glucose-Capillary: 196 mg/dL — ABNORMAL HIGH (ref 70–99)

## 2019-08-26 MED ORDER — LEVETIRACETAM 100 MG/ML PO SOLN
500.0000 mg | Freq: Two times a day (BID) | ORAL | Status: DC
  Administered 2019-08-26 – 2019-09-07 (×23): 500 mg
  Filled 2019-08-26 (×23): qty 5

## 2019-08-26 NOTE — Progress Notes (Addendum)
Andres [name] PHYSICAL MEDICINE & REHABILITATION PROGRESS NOTE  Subjective/Complaints: Alert but not vocalizing this am  Per PT severe pushers syndrome   ROS: Unable to assess due to cognition/language  Objective: Vital Signs: Blood pressure 130/88, pulse 97, temperature 98.1 F (36.7 C), resp. rate 19, weight 93.7 kg, SpO2 99 %. No results found. Recent Labs    08/23/19 1025  WBC 8.3  HGB 15.1  HCT 44.4  PLT 491*   Recent Labs    08/23/19 1025 08/23/19 1025 08/24/19 0815 08/26/19 0526  NA 136  --  135  --   K 5.2*  --  4.7  --   CL 97*  --  98  --   CO2 26  --  26  --   GLUCOSE 132*  --  130*  --   BUN 30*  --  30*  --   CREATININE 1.43*   < > 1.20 1.44*  CALCIUM 10.7*  --  10.3  --    < > = values in this interval not displayed.    Physical Exam: BP 130/88   Pulse 97   Temp 98.1 F (36.7 C)   Resp 19   Wt 93.7 kg   SpO2 99%   BMI 32.35 kg/m  Constitutional: No distress . Vital signs reviewed.  Morbidly obese. HENT: Normocephalic.  Atraumatic.  + NG. Eyes: EOMI. No discharge. Cardiovascular: No JVD. Respiratory: Normal effort.  No stridor. GU scrotal edema, penile shaft not visualized, able to see meatus   GI: Non-distended. Skin: Warm and dry.  Intact. Psych: lethargy  Musc: Lower extremity edema. Neurological: Alert Dysarthria/anarthric Motor: 3- Left shouder , hip flexor and knee ext, (spont movement not to command ) Sensory no withdrawal to pinch  Severe Left neglect does not cross midline   Assessment/Plan: 1. Functional deficits secondary to right MCA infarct which require 3+ hours per day of interdisciplinary therapy in a comprehensive inpatient rehab setting.  Physiatrist is providing close team supervision and 24 hour management of active medical problems listed below.  Physiatrist and rehab team continue to assess barriers to discharge/monitor patient progress toward functional and medical goals  Care Tool:  Bathing        Body parts  bathed by helper: (whole body)     Bathing assist Assist Level: 2 Helpers     Upper Body Dressing/Undressing Upper body dressing   What is the patient wearing?: Hospital gown only    Upper body assist Assist Level: Dependent - Patient 0%    Lower Body Dressing/Undressing Lower body dressing      What is the patient wearing?: Incontinence brief     Lower body assist Assist for lower body dressing: 2 Helpers     Toileting Toileting    Toileting assist Assist for toileting: 2 Helpers     Transfers Chair/bed transfer  Transfers assist     Chair/bed transfer assist level: Dependent - mechanical lift     Locomotion Ambulation   Ambulation assist   Ambulation activity did not occur: Safety/medical concerns          Walk 10 feet activity   Assist  Walk 10 feet activity did not occur: Safety/medical concerns        Walk 50 feet activity   Assist Walk 50 feet with 2 turns activity did not occur: Safety/medical concerns         Walk 150 feet activity   Assist Walk 150 feet activity did not occur: Safety/medical concerns  Walk 10 feet on uneven surface  activity   Assist Walk 10 feet on uneven surfaces activity did not occur: Safety/medical concerns         Wheelchair     Assist   Type of Wheelchair: Manual    Wheelchair assist level: Dependent - Patient 0% Max wheelchair distance: 150    Wheelchair 50 feet with 2 turns activity    Assist        Assist Level: Dependent - Patient 0%   Wheelchair 150 feet activity     Assist     Assist Level: Dependent - Patient 0%      Medical Problem List and Plan: 1.  Left side weakness with dysarthria and dysphagia secondary to acute right MCA infarction  Continue CIR PT,  OT ,SLP-limited goals Mod A, may need SNF Prognosis guarded due to size of R MCA infarct, basically entire MCA territory   2.  Antithrombotics: -DVT/anticoagulation: Lovenox              -antiplatelet therapy: Aspirin 81 mg daily, Plavix 75 mg daily x3 months then Plavix alone 3. Pain Management: Voltaren gel as needed 4. Mood: Provide emotional support             -antipsychotic agents: N/A 5. Neuropsych: This patient is not capable of making decisions on his own behalf. 6. Skin/Wound Care: Routine skin checks 7. Fluids/Electrolytes/Nutrition: Routine in and outs.     8.  Post stroke dysphagia.        N.p.o.             Nasogastric tube feeds.               Advance diet as tolerated 9.  Seizure disorder.  Continue Keppra 750 mg every 12 hours, EEG negative  No seizures since admission to rehab 10.  Essential hypertension.  Lopressor 25 mg twice daily   Vitals:   08/25/19 1952 08/26/19 0403  BP: (!) 135/93 130/88  Pulse: 97   Resp: 18 19  Temp: 98.3 F (36.8 C) 98.1 F (36.7 C)  SpO2: 99%   Hold BB for HR < 60, BP controlled   Monitor with increased mobility 11.  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  Hemoglobin A1c 6.2. CBG (last 3)  Recent Labs    08/26/19 0003 08/26/19 0350 08/26/19 0754  GLUCAP 182* 105* 151*               Monitor with increased mobility 12.  CKD stage III.              Creat normal this am BUN mildly elevated will increase free H2O 13.  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  Latest blood cultures question contaminant per infectious disease.  Ancef completed 08/19/2019.- afebrile              Repeat blood culture no growth 14.  CAD with history of CABG 1997.  Continue aspirin and Plavix 15.  Hyperlipidemia: Continue Lipitor 16.  BPH.  Flomax 0.4 mg daily.               incont with low residuals  17.  Morbid obesity.  BMI 35.70.  Dietary follow-up 18.  Acute blood loss anemia Resolved   Hemoglobin 12.3 on 1/14, 14.7 on 1/18    19.  Lethargy may be sleep wake cycle related, unremarkable CBC, BMET, CXR  neg start ritalin trial  Check sleep graph LOS: 7 days A FACE TO FACE EVALUATION  WAS  PERFORMED  [name] 08/26/2019, 8:07 AM

## 2019-08-26 NOTE — Progress Notes (Signed)
Speech Language Pathology Weekly Progress and Session Note  Patient Details  Name: Andres [name] MRN: [id number] Date of Birth: May 09, 1941  Beginning of progress report period: August 20, 2019 End of progress report period: August 26, 2019   Short Term Goals: Week 1: SLP Short Term Goal 1 (Week 1): Pt will consume therapeutic trials of thin liquids, ice chips, or purees with max assist multimodal cues for use of swallowing precautions and minimal overt s/s of aspiratin over 3 consecutive sessions prior to instrumental assessment. SLP Short Term Goal 1 - Progress (Week 1): Progressing toward goal SLP Short Term Goal 2 (Week 1): Pt will focus his attention to stimuli in >25% of opportunities with max assist multimodal cues. SLP Short Term Goal 2 - Progress (Week 1): Progressing toward goal SLP Short Term Goal 3 (Week 1): Pt will remain alert for >1 minute with max assist multimodal cues. SLP Short Term Goal 3 - Progress (Week 1): Progressing toward goal SLP Short Term Goal 4 (Week 1): Pt will orient to place, date, and situation with max assist multimodal cues. SLP Short Term Goal 4 - Progress (Week 1): Progressing toward goal SLP Short Term Goal 5 (Week 1): Pt will visually scan to the left of midline in >25% of opportunities during basic structured tasks with max assist multimodal cues. SLP Short Term Goal 5 - Progress (Week 1): Progressing toward goal    New Short Term Goals: Week 2: SLP Short Term Goal 1 (Week 2): Pt will consume therapeutic trials of thin liquids, ice chips, or purees with max assist multimodal cues for use of swallowing precautions and minimal overt s/s of aspiratin over 3 consecutive sessions prior to instrumental assessment. SLP Short Term Goal 2 (Week 2): Pt will focus his attention to stimuli in >25% of opportunities with max assist multimodal cues. SLP Short Term Goal 3 (Week 2): Pt will remain alert for >1 minute with max assist multimodal cues. SLP Short Term  Goal 4 (Week 2): Pt will orient to place, date, and situation with max assist multimodal cues. SLP Short Term Goal 5 (Week 2): Pt will visually scan to the left of midline in >25% of opportunities during basic structured tasks with max assist multimodal cues.  Weekly Progress Updates: Pt has made very minimal functional gains due to severe lethargy and cognitive impairments limiting his participation as well as dysphagaia and communication difficulties. Therefore, no short term goals were met this week. Pt is currently Max-Total assist due to before mentioned deficits. Pt is NPO with NG tube as he has not been able to maintain arousal appropriate for PO intake, and in fleeting moments of alertness he has expectorated POs. Pt has demonstrated improved focused attention today. No family has been present for ST sessions yet. Pt would continue to benefit from skilled ST while inpatient in order to maximize functional independence and reduce burden of care prior to discharge. Anticipate that pt will need 24/7 supervision at discharge in addition to [place] follow up at next level of care.     Intensity: Minumum of 1-2 x/day, 30 to 90 minutes Frequency: 3 to 5 out of 7 days Duration/Length of Stay: 21-28 days Treatment/Interventions: Cognitive remediation/compensation;Cueing hierarchy;Functional tasks;Patient/family education;Environmental controls;Dysphagia/aspiration precaution training;Internal/external aids            Arbutus Leas 08/26/2019, 12:17 PM

## 2019-08-26 NOTE — Progress Notes (Signed)
Occupational Therapy Session Note  Patient Details  Name: Andres [name] MRN: [id number] Date of Birth: 11/01/1940  Today's Date: 08/26/2019 OT Individual Time: 1450-1530 OT Individual Time Calculation (min): 40 min   Short Term Goals: Week 1:  OT Short Term Goal 1 (Week 1): Pt will complete toilet or BSC transfer with 2 assist and LRAD OT Short Term Goal 2 (Week 1): Pt will initiate washing 1 body area when handed a wash cloth during bathing OT Short Term Goal 3 (Week 1): Pt will assist with threading L UE into a shirt with max vcs  Skilled Therapeutic Interventions/Progress Updates:    Pt greeted in bed, VERY lethargic with minimal eye opening even when provided with verbal and tactile stimulation. Provided cold wash cloth to face and sternal rub with no change in alertness. Heavy +2 for supine<sit. Once EOB, pt actively pushing himself back in bed, very squirmy, so +2 for returning safely to bed and also for boosting up. Pt unable to initiate assisting with boosting in bed once Rt hand was placed on headboard. Max HOH for hand placement when rolling Rt>Lt to change soiled brief. Note pt with poor proprioceptive awareness of L UE, often extending limb out towards face of 2nd helper during bed mobility. Total A to engage in oral care using suction sponge, hospital gown change, applying deoderant, hand washing using sanitizer, and lotion application. Pt often resistant to [facility] with Lt and Rt UEs, very squirmy and sliding down towards base of bed, needing to be repositioned multiple times throughout tx (with +2 assist). Unable to follow 1 step instruction, even with [facility] assist. At end of session pt was in bed with 4 bedrails up, call bell, and bed alarm set.   Therapy Documentation Precautions:  Precautions Precautions: Fall Precaution Comments: Lt hemi Restrictions Weight Bearing Restrictions: No Vital Signs: Therapy Vitals Temp: 97.8 F (36.6 C) Pulse Rate: 90 Resp: 18 BP:  135/87 Patient Position (if appropriate): Lying Oxygen Therapy SpO2: 95 % Pain: No s/s pain during tx  Pain Assessment Pain Scale: 0-10 Pain Score: 0-No pain ADL: ADL Eating: Not assessed Grooming: Dependent Where Assessed-Grooming: Bed level Upper Body Bathing: Dependent Where Assessed-Upper Body Bathing: Edge of bed Lower Body Bathing: Dependent Where Assessed-Lower Body Bathing: Bed level Upper Body Dressing: Dependent Where Assessed-Upper Body Dressing: Edge of bed Lower Body Dressing: Dependent Where Assessed-Lower Body Dressing: Bed level Toileting: Not assessed Toilet Transfer: Not assessed Tub/Shower Transfer: Not assessed ADL Comments: Pt is dependent +2 assist during self care tasks excluding grooming (+1 assist) :     Therapy/Group: Individual Therapy  Deyani Hegarty A Marlayna Bannister 08/26/2019, 3:54 PM

## 2019-08-26 NOTE — Plan of Care (Signed)
  Problem: Consults Goal: RH GENERAL PATIENT EDUCATION Description: See Patient Education module for education specifics. Outcome: Progressing Goal: Skin Care Protocol Initiated - if Braden Score 18 or less Description: If consults are not indicated, leave blank or document N/A Outcome: Progressing Goal: Nutrition Consult-if indicated Outcome: Progressing Goal: Diabetes Guidelines if Diabetic/Glucose > 140 Description: If diabetic or lab glucose is > 140 mg/dl - Initiate Diabetes/Hyperglycemia Guidelines & Document Interventions  Outcome: Progressing   Problem: RH BOWEL ELIMINATION Goal: RH STG MANAGE BOWEL WITH ASSISTANCE Description: STG Manage Bowel with mod Assistance. Outcome: Progressing Goal: RH STG MANAGE BOWEL W/MEDICATION W/ASSISTANCE Description: STG Manage Bowel with Medication with min Assistance. Outcome: Progressing   Problem: RH BLADDER ELIMINATION Goal: RH STG MANAGE BLADDER WITH ASSISTANCE Description: STG Manage Bladder With mod Assistance Outcome: Progressing   Problem: RH SKIN INTEGRITY Goal: RH STG SKIN FREE OF INFECTION/BREAKDOWN Description: No new breakdown prior to discharge Outcome: Progressing Goal: RH STG MAINTAIN SKIN INTEGRITY WITH ASSISTANCE Description: STG Maintain Skin Integrity With  mod Assistance. Outcome: Progressing   Problem: RH SAFETY Goal: RH STG ADHERE TO SAFETY PRECAUTIONS W/ASSISTANCE/DEVICE Description: STG Adhere to Safety Precautions With  mod Assistance/Device. Outcome: Progressing   Problem: RH PAIN MANAGEMENT Goal: RH STG PAIN MANAGED AT OR BELOW PT'S PAIN GOAL Description: Less than 4 Outcome: Progressing   Problem: RH KNOWLEDGE DEFICIT GENERAL Goal: RH STG INCREASE KNOWLEDGE OF SELF CARE AFTER HOSPITALIZATION Description: Patient 's family will be able to verbalize how to assist patient in self care prior to discharge Outcome: Progressing

## 2019-08-26 NOTE — Progress Notes (Signed)
Speech Language Pathology Daily Session Note  Patient Details  Name: Andres [name] MRN: [id number] Date of Birth: 1941/06/02  Today's Date: 08/26/2019 SLP Individual Time: 1346-1415 SLP Individual Time Calculation (min): 29 min  Short Term Goals: Week 2: SLP Short Term Goal 1 (Week 2): Pt will consume therapeutic trials of thin liquids, ice chips, or purees with max assist multimodal cues for use of swallowing precautions and minimal overt s/s of aspiratin over 3 consecutive sessions prior to instrumental assessment. SLP Short Term Goal 2 (Week 2): Pt will focus his attention to stimuli in >25% of opportunities with max assist multimodal cues. SLP Short Term Goal 3 (Week 2): Pt will remain alert for >1 minute with max assist multimodal cues. SLP Short Term Goal 4 (Week 2): Pt will orient to place, date, and situation with max assist multimodal cues. SLP Short Term Goal 5 (Week 2): Pt will visually scan to the left of midline in >25% of opportunities during basic structured tasks with max assist multimodal cues.  Skilled Therapeutic Interventions: Pt was seen for skilled ST targeting cognition and education with pt and his daughter at bedside. Cognitive interventions focused on assisting pt to maintain arousal and achieve focused attention. Pt still very lethargic and only able to arouse for <20 second intervals. He did respond verbally to deny any pain at this time, but otherwise mostly nonverbal. Pt's daughter reported although minimally verbal, she has been able to speak with him some when he is more alert and appears to comprehend her messages. SLP provided education regarding pt's severe lethargy and impact of alertness of swallow safety and current NPO status as well as need for instrumental swallow study prior to any PO diet initiation. Also discussed poor secretion management requiring consistent suctioning and overall mod-sev aspiration risk at this time. Pt did allow SLP to complete oral  care and apply gel to lips for moisture. Pt left laying in bed with alarm set and needs within reach, daughter still present. Continue per current plan of care.       Pain Pain Assessment Pain Scale: 0-10 Pain Score: 0-No pain  Therapy/Group: Individual Therapy  Andres [name] 08/26/2019, 3:33 PM

## 2019-08-26 NOTE — Plan of Care (Signed)
PT long term goals Downgraded due to lack of progress 2/2 severe pushers syndrome, severe L inattention, severe apraxia, poor awareness and initiation, and continued lethargy.  See Care plan for details.    [name] PT, DPT   08/26/19 5:33 PM

## 2019-08-26 NOTE — Progress Notes (Signed)
Physical Therapy Weekly Progress Note  Patient Details  Name: Andres [name] MRN: [id number] Date of Birth: August 08, 1940  Beginning of progress report period: August 20, 2019 End of progress report period: August 26, 2019  Today's Date: 08/26/2019 PT Individual Time: 0800-0910  PT Individual Time Calculation (min): 70 min   Patient has met 0 of 4 short term goals.  Pt is making slower than expected progress towards LTG. Pt continues to required total A + 2 for most mobility including sit<>supine, Sit<>stand, sitting balance, and squat pivot transfer. Pt has improve slightly with bed mobility requiring only max A of 1 intermittently. Continued lethargy, Severe pushers syndrome, severe L side neglect, and apraxia are the biggest limiting impairments at this time with nearly no change since evaluation.   Patient continues to demonstrate the following deficits muscle weakness, muscle joint tightness and muscle paralysis, decreased cardiorespiratoy endurance, impaired timing and sequencing, unbalanced muscle activation, motor apraxia, decreased coordination and decreased motor planning, decreased visual acuity, decreased visual perceptual skills, decreased visual motor skills, field cut and hemianopsia, decreased attention to left, left side neglect, decreased motor planning and ideational apraxia, decreased initiation, decreased attention, decreased awareness, decreased problem solving, decreased safety awareness, decreased memory and delayed processing and decreased sitting balance, decreased standing balance, decreased postural control, hemiplegia, decreased balance strategies and difficulty maintaining precautions and therefore will continue to benefit from skilled PT intervention to increase functional independence with mobility.  Patient not progressing toward long term goals.  See goal revision..  Continue plan of care.  PT Short Term Goals Week 1:  PT Short Term Goal 1 (Week 1): Pt will sit EOB  with max assist of 1 person PT Short Term Goal 1 - Progress (Week 1): Partly met PT Short Term Goal 2 (Week 1): Pt will transfer to [facility] with max A +2 and stedy PT Short Term Goal 2 - Progress (Week 1): Not met PT Short Term Goal 3 (Week 1): Pt will attend to L side 25% of the time during functional activity to improve safety awareness PT Short Term Goal 3 - Progress (Week 1): Not met PT Short Term Goal 4 (Week 1): Pt will tolerate sitting up in WC 3 hours between therapies. PT Short Term Goal 4 - Progress (Week 1): Not met Week 2:  PT Short Term Goal 1 (Week 2): Pt will transfer to WC in stedy PT Short Term Goal 2 (Week 2): Pt will tolerate sitting in WC >2 hours between therapies. PT Short Term Goal 3 (Week 2): Pt will perform sit<>stand with max assist of 1 PT Short Term Goal 4 (Week 2): Pt will maintain sitting balance with mod-max assist of 1 EOB up to 5 minutes  Skilled Therapeutic Interventions/Progress Updates:   Pt received supine in bed and agreeable to PT. Supine>sit transfer with total A + 2.   Sitting balance EOB with max A to max A +2 x 10 minutes with constant Pushing to the L/posteiror requiring multimodal cues for improve pelvic positioning to prevent slide off the bed. Attempted sit<>stand in stedy. Due to lack of initiation and severe pushing tendencies to the L, pt was unable to achieve standing after 5 attempts. Squat pivot transfer to [facility] with total A+2 with L knee blocked. Requiring assist for PT and rehab tech for adequate pelvic translation to reach [facility].   Sit<>stand x2 at rail in hall with total A from PT and max cues for initiation, positioning, and midline awareness. Attempted to utilize visual feedback  from mirror, but pt unable to maintain adequate straight gaze to see himself in mirror. Standing tolerance x 25 seconds and 10 seconds respectively, until pt forcing himself back into sitting, regardless of cues from PT.   Pt returned to room and performed maximove  transfer to bed. Max assist of 1 for Rolling R and L to remove sling, and pt left supine in bed with call bell in reach and all needs met.        Therapy Documentation Precautions:  Precautions Precautions: Fall Precaution Comments: Lt hemi Restrictions Weight Bearing Restrictions: No    Vital Signs: Therapy Vitals Temp: 97.8 F (36.6 C) Pulse Rate: 90 Resp: 18 BP: 135/87 Patient Position (if appropriate): Lying Oxygen Therapy SpO2: 95 % Pain: Pain Assessment Pain Scale: 0-10 Pain Score: 0-No pain  Therapy/Group: Individual Therapy  Lorie Phenix 08/26/2019, 5:38 PM

## 2019-08-26 NOTE — Progress Notes (Signed)
Speech Language Pathology Daily Session Note  Patient Details  Name: Andres [name] MRN: [id number] Date of Birth: [birth year]  Today's Date: 08/26/2019 SLP Individual Time: 2072-1828 SLP Individual Time Calculation (min): 30 min  Short Term Goals: Week 1: SLP Short Term Goal 1 (Week 1): Pt will consume therapeutic trials of thin liquids, ice chips, or purees with max assist multimodal cues for use of swallowing precautions and minimal overt s/s of aspiratin over 3 consecutive sessions prior to instrumental assessment. SLP Short Term Goal 1 - Progress (Week 1): Progressing toward goal SLP Short Term Goal 2 (Week 1): Pt will focus his attention to stimuli in >25% of opportunities with max assist multimodal cues. SLP Short Term Goal 2 - Progress (Week 1): Progressing toward goal SLP Short Term Goal 3 (Week 1): Pt will remain alert for >1 minute with max assist multimodal cues. SLP Short Term Goal 3 - Progress (Week 1): Progressing toward goal SLP Short Term Goal 4 (Week 1): Pt will orient to place, date, and situation with max assist multimodal cues. SLP Short Term Goal 4 - Progress (Week 1): Progressing toward goal SLP Short Term Goal 5 (Week 1): Pt will visually scan to the left of midline in >25% of opportunities during basic structured tasks with max assist multimodal cues. SLP Short Term Goal 5 - Progress (Week 1): Progressing toward goal  Skilled Therapeutic Interventions: Pt was seen for skilled ST targeting cognition. Pt still very fatigued, however able to open eyes for longer period of time in one instance, specifically to look out window when blinds were opened for pt (maintained eyes open and attention on window for ~15-20 seconds). Pt verbally acknowledged need for oral care via suction and more agreeable to allow SLP to perform oral care with Total A. Max A multimodal cues also provided for pt to reposition first to laying on his back and then sitting semi-reclined in the bed. Pt  maintained semi-upright position for ~5 minutes before sliding himself back down in bed on side lying. Pt non-responsive to orientation questions, Total A provided for orientation to place and situation X2. With Max A multimodal cues and SLP at pt's eye level, he was able to achieve focused attention for ~5 seconds X3. No initiation during basic self care tasks noted (washing face, etc.) despite Max A multimodal cues and encouragement. Of note, pt's right shoulder/gown soaked in secretions upon therapist's arrival. He continues to require Total A for secretion management at this time and is not appropriate for participation in MBSS to determine PO diet. Medical team may wish to pursue longer term means of nutrition. Pt left laying in bed with alarm set and needs met. Continue per current plan of care.       Pain Pain Assessment Pain Scale: 0-10 Pain Score: 0-No pain  Therapy/Group: Individual Therapy  Arbutus Leas 08/26/2019, 11:13 AM

## 2019-08-26 NOTE — Progress Notes (Signed)
Pt slept on and off during night. Restless at most times. Only speaking a few words, not following directions. Spoke with daughter at beginning of shift, is concerned about his ongoing lethargy, attempts to talk to him on the phone but he begins to snore. Informed started on Ritalin, may be best to try to speak to him in the daytime after he has had his med. Seems confused about why he is taking meds, thinks onl for ADHD answered questions within scope, enc to speak with MD

## 2019-08-27 ENCOUNTER — Inpatient Hospital Stay (HOSPITAL_COMMUNITY): Payer: Medicare (Managed Care)

## 2019-08-27 LAB — GLUCOSE, CAPILLARY
Glucose-Capillary: 126 mg/dL — ABNORMAL HIGH (ref 70–99)
Glucose-Capillary: 199 mg/dL — ABNORMAL HIGH (ref 70–99)
Glucose-Capillary: 143 mg/dL — ABNORMAL HIGH (ref 70–99)
Glucose-Capillary: 163 mg/dL — ABNORMAL HIGH (ref 70–99)
Glucose-Capillary: 166 mg/dL — ABNORMAL HIGH (ref 70–99)

## 2019-08-27 MED ORDER — METHYLPHENIDATE HCL 5 MG PO TABS
10.0000 mg | ORAL_TABLET | Freq: Two times a day (BID) | ORAL | Status: DC
  Administered 2019-08-27 – 2019-09-01 (×11): 10 mg via ORAL
  Filled 2019-08-27 (×11): qty 2

## 2019-08-27 NOTE — Progress Notes (Signed)
[place] PHYSICAL MEDICINE & REHABILITATION PROGRESS NOTE  Subjective/Complaints: Pt up a few hours last night. Was restless  ROS: Limited due to cognitive/behavioral     Objective: Vital Signs: Blood pressure (!) 158/96, pulse (!) 106, temperature 98.6 F (37 C), temperature source Axillary, resp. rate 16, weight 94.9 kg, SpO2 93 %. CT HEAD WO CONTRAST  Result Date: 08/26/2019 CLINICAL DATA:  Cerebral hemorrhage suspected. Additional history provided: Left-sided weakness with dysarthria and dysphagia secondary to acute right MCA infarction. EXAM: CT HEAD WITHOUT CONTRAST TECHNIQUE: Contiguous axial images were obtained from the base of the skull through the vertex without intravenous contrast. COMPARISON:  Head CT 08/14/2019 FINDINGS: Brain: Again demonstrated is a large right MCA vascular territory subacute infarct. Portions of the infarcted territory demonstrate fogging on the current examination. There has been a significant interval decrease in associated swelling and mass effect. No evidence of hemorrhagic conversion. No new demarcated infarct is identified. No midline shift, hydrocephalus or extra-axial fluid collection. Background chronic small vessel ischemic disease, unchanged. Mild generalized parenchymal atrophy. Vascular: No definite hyperdense vessel. However, a short segment occlusion was demonstrated at the right M1/M2 junction on prior CTA head/neck 08/09/2019. Skull: Normal. Negative for fracture or focal lesion. Sinuses/Orbits: Visualized orbits demonstrate no acute abnormality. No significant paranasal sinus disease or mastoid effusion at the imaged levels. Other: Redemonstrated right suboccipital lipoma. 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 No evidence of hemorrhagic conversion. Significant interval decrease in associated mass effect as compared to 08/14/2019. No new demarcated infarct is identified. Stable background generalized parenchymal  atrophy and chronic small vessel ischemic disease. Electronically Signed   By: Kellie Simmering DO   On: 08/26/2019 13:35   No results for input(s): WBC, HGB, HCT, PLT in the last 72 hours. Recent Labs    08/26/19 0526  CREATININE 1.44*    Physical Exam: BP (!) 158/96 (BP Location: Right Arm)   Pulse (!) 106   Temp 98.6 F (37 C) (Axillary)   Resp 16   Wt 94.9 kg   SpO2 93%   BMI 32.77 kg/m  Constitutional: No distress . Vital signs reviewed. HEENT: EOMI, oral membranes moist, NGT Neck: supple Cardiovascular: RRR without murmur. No JVD    Respiratory: CTA Bilaterally without wheezes or rales. Normal effort    GI: BS +, non-tender, non-distended  GU scrotal edema, penile shaft not visualized, able to see meatus   Skin: Warm and dry.  Intact. Psych: slow to arouse, arouses to verbal and tactile stim  Musc: Lower extremity edema. Neurological:   Dysarthria/anarthric Motor: 3- Left shouder , hip flexor and knee ext, (spont movement not to command )--no changes Sensory no withdrawal to pinch  Severe Left neglect onging  Assessment/Plan: 1. Functional deficits secondary to right MCA infarct which require 3+ hours per day of interdisciplinary therapy in a comprehensive inpatient rehab setting.  Physiatrist is providing close team supervision and 24 hour management of active medical problems listed below.  Physiatrist and rehab team continue to assess barriers to discharge/monitor patient progress toward functional and medical goals  Care Tool:  Bathing        Body parts bathed by helper: (whole body)     Bathing assist Assist Level: 2 Helpers     Upper Body Dressing/Undressing Upper body dressing   What is the patient wearing?: Hospital gown only    Upper body assist Assist Level: Dependent - Patient 0%    Lower Body Dressing/Undressing Lower body dressing  What is the patient wearing?: Incontinence brief     Lower body assist Assist for lower body  dressing: 2 Helpers     Toileting Toileting    Toileting assist Assist for toileting: 2 Helpers     Transfers Chair/bed transfer  Transfers assist     Chair/bed transfer assist level: Dependent - mechanical lift     Locomotion Ambulation   Ambulation assist   Ambulation activity did not occur: Safety/medical concerns          Walk 10 feet activity   Assist  Walk 10 feet activity did not occur: Safety/medical concerns        Walk 50 feet activity   Assist Walk 50 feet with 2 turns activity did not occur: Safety/medical concerns         Walk 150 feet activity   Assist Walk 150 feet activity did not occur: Safety/medical concerns         Walk 10 feet on uneven surface  activity   Assist Walk 10 feet on uneven surfaces activity did not occur: Safety/medical concerns         Wheelchair     Assist   Type of Wheelchair: Manual    Wheelchair assist level: Dependent - Patient 0% Max wheelchair distance: 150    Wheelchair 50 feet with 2 turns activity    Assist        Assist Level: Dependent - Patient 0%   Wheelchair 150 feet activity     Assist     Assist Level: Dependent - Patient 0%      Medical Problem List and Plan: 1.  Left side weakness with dysarthria and dysphagia secondary to acute right MCA infarction  Continue CIR PT,  OT ,SLP-limited goals Mod A, may need SNF Prognosis guarded due to size of R MCA infarct, basically entire MCA territory   2.  Antithrombotics: -DVT/anticoagulation: Lovenox             -antiplatelet therapy: Aspirin 81 mg daily, Plavix 75 mg daily x3 months then Plavix alone 3. Pain Management: Voltaren gel as needed 4. Mood: Provide emotional support             -antipsychotic agents: N/A 5. Neuropsych: This patient is not capable of making decisions on his own behalf. 6. Skin/Wound Care: Routine skin checks 7. Fluids/Electrolytes/Nutrition: Routine in and outs.     8.  Post stroke  dysphagia.        N.p.o.             Nasogastric tube feeds.               Advance diet when more alert/able to tolerate 9.  Seizure disorder.  Continue Keppra 750 mg every 12 hours, EEG negative  No seizures since admission to rehab 10.  Essential hypertension.  Lopressor 25 mg twice daily   Vitals:   08/27/19 0407 08/27/19 0432  BP: (!) 157/114 (!) 158/96  Pulse: (!) 106   Resp: 16   Temp: 98.6 F (37 C)   SpO2: 93%   Hold BB for HR < 60   1/23 bp elevated this morning (had been under reasonable control prior)--obsv    11.  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  Hemoglobin A1c 6.2. CBG (last 3)  Recent Labs    08/26/19 2349 08/27/19 0404 08/27/19 0835  GLUCAP 196* 126* 199*  control has been fair to borderline, consider adding lantus in AM if numbers dont' improve 12.  CKD stage III.              Creat normal this am BUN mildly elevated will increase free H2O 13.  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  Latest blood cultures question contaminant per infectious disease.  Ancef completed 08/19/2019.- afebrile              Repeat blood culture no growth 14.  CAD with history of CABG 1997.  Continue aspirin and Plavix 15.  Hyperlipidemia: Continue Lipitor 16.  BPH.  Flomax 0.4 mg daily.               incont with low residuals  17.  Morbid obesity.  BMI 35.70.  Dietary follow-up 18.  Acute blood loss anemia Resolved   Hemoglobin 12.3 on 1/14, 14.7 on 1/18    19.  Lethargy may be sleep wake cycle related, unremarkable CBC, BMET, CXR  neg start ritalin trial   1/23 -increase ritalin to 10mg   Continue sleep chart   LOS: 8 days A FACE TO FACE EVALUATION WAS PERFORMED  Meredith Staggers 08/27/2019, 11:34 AM

## 2019-08-27 NOTE — Progress Notes (Signed)
Occupational Therapy Weekly Progress Note  Patient Details  Name: FRANCISZEK PLATTEN MRN: [id number] Date of Birth: 10/27/1940  Beginning of progress report period: 08/20/2019 End of progress report period: 08/27/2019  Patient has met 0 of 3 short term goals.    Pt has made very minimal progress at time of report. He continues to require +2 assist for functional transfers and self care tasks completed bedlevel. Pt is limited functionally by inability to follow 1 step instruction, profound lethargy and apraxia, Lt hemiparesis, and trunk control deficits. Pending whether pt will d/c home with daughter vs SNF at this time. Continue OT POC.   Patient continues to demonstrate the following deficits: muscle weakness and muscle joint tightness, decreased cardiorespiratoy endurance, unbalanced muscle activation, motor apraxia and ataxia, decreased visual motor skills, decreased midline orientation, decreased attention to left, left side neglect, ideational apraxia and pushing tendencies, decreased initiation, decreased attention, decreased awareness, decreased problem solving, decreased safety awareness and delayed processing and decreased sitting balance, decreased postural control and hemiplegia and therefore will continue to benefit from skilled OT intervention to enhance overall performance with BADL.  Patient progressing toward long term goals..  Continue plan of care.  OT Short Term Goals Week 1:  OT Short Term Goal 1 (Week 1): Pt will complete toilet or BSC transfer with 2 assist and LRAD OT Short Term Goal 1 - Progress (Week 1): Progressing toward goal OT Short Term Goal 2 (Week 1): Pt will initiate washing 1 body area when handed a wash cloth during bathing OT Short Term Goal 2 - Progress (Week 1): Not met OT Short Term Goal 3 (Week 1): Pt will assist with threading L UE into a shirt with max vcs OT Short Term Goal 3 - Progress (Week 1): Not met Week 2:  OT Short Term Goal 1 (Week 2): Pt will  maintain sustained attention to 1 self care task for ~10 seconds with min HOH assist for initiation OT Short Term Goal 2 (Week 2): Pt will complete 1 self care task w/c level at the sink to increase OOB tolerance OT Short Term Goal 3 (Week 2): Pt will complete toilet or BSC transfer with +2 assist and LRAD     Therapy Documentation Precautions:  Precautions Precautions: Fall Precaution Comments: Lt hemi Restrictions Weight Bearing Restrictions: No Vital Signs: Therapy Vitals Temp: 98.6 F (37 C) Temp Source: Axillary Pulse Rate: (!) 106 Resp: 16 BP: (!) 158/96 Patient Position (if appropriate): Lying(agitated) Oxygen Therapy SpO2: 93 % O2 Device: Room Air ADL: ADL Eating: Not assessed Grooming: Dependent Where Assessed-Grooming: Bed level Upper Body Bathing: Dependent Where Assessed-Upper Body Bathing: Edge of bed Lower Body Bathing: Dependent Where Assessed-Lower Body Bathing: Bed level Upper Body Dressing: Dependent Where Assessed-Upper Body Dressing: Edge of bed Lower Body Dressing: Dependent Where Assessed-Lower Body Dressing: Bed level Toileting: Not assessed Toilet Transfer: Not assessed Tub/Shower Transfer: Not assessed ADL Comments: Pt is dependent +2 assist during self care tasks excluding grooming (+1 assist)     Therapy/Group: Individual Therapy  Quanisha Drewry A Fairley Copher 08/27/2019, 7:22 AM

## 2019-08-27 NOTE — Progress Notes (Signed)
Speech Language Pathology Daily Session Note  Patient Details  Name: Andres [name] MRN: [id number] Date of Birth: 07/28/1941  Today's Date: 08/27/2019 SLP Individual Time: 1445-1505 SLP Individual Time Calculation (min): 20 min  Short Term Goals: Week 2: SLP Short Term Goal 1 (Week 2): Pt will consume therapeutic trials of thin liquids, ice chips, or purees with max assist multimodal cues for use of swallowing precautions and minimal overt s/s of aspiratin over 3 consecutive sessions prior to instrumental assessment. SLP Short Term Goal 2 (Week 2): Pt will focus his attention to stimuli in >25% of opportunities with max assist multimodal cues. SLP Short Term Goal 3 (Week 2): Pt will remain alert for >1 minute with max assist multimodal cues. SLP Short Term Goal 4 (Week 2): Pt will orient to place, date, and situation with max assist multimodal cues. SLP Short Term Goal 5 (Week 2): Pt will visually scan to the left of midline in >25% of opportunities during basic structured tasks with max assist multimodal cues.  Skilled Therapeutic Interventions: Skilled ST services focused on cognitive skills. Pt was alert upon entering room and remained alert (eyes open) for 5 minute intervals, however total A for focused attention. SLP repositioned pt in bed for oral care, although pt was unable to follow commands (open oral cavity) to participate in oral care via suction toothbrush, despite multimodal cues. SLP applied oral moisture to lips. Pt did produced some unintelligibility and non-speech vocalizations. SLP attempted to focus attention on calendar to target orientation with multimodal responses, but task was unsuccessful. Pt missed 25 minutes of skilled treatment time due to leathgary and reduced alertness. Pt was left in room with call bell within reach and bed alarm set. ST recommends to continue skilled ST services.      Pain Pain Assessment Pain Scale: Faces Pain Score: 0-No  pain  Therapy/Group: Individual Therapy  Taino Maertens  [facility] 08/27/2019, 7:50 AM

## 2019-08-27 NOTE — Plan of Care (Signed)
  Problem: Consults Goal: [facility] GENERAL PATIENT EDUCATION Description: See Patient Education module for education specifics. Outcome: Progressing   

## 2019-08-27 NOTE — Progress Notes (Signed)
Pt again sleeping on and off during night, still restless at most times. He has been much more alert than usual but is still not talking very much. Still no BM after miralax x2 days, needs escalation in treatment

## 2019-08-28 ENCOUNTER — Inpatient Hospital Stay (HOSPITAL_COMMUNITY): Payer: Medicare (Managed Care) | Admitting: *Deleted

## 2019-08-28 LAB — GLUCOSE, CAPILLARY
Glucose-Capillary: 112 mg/dL — ABNORMAL HIGH (ref 70–99)
Glucose-Capillary: 132 mg/dL — ABNORMAL HIGH (ref 70–99)
Glucose-Capillary: 180 mg/dL — ABNORMAL HIGH (ref 70–99)
Glucose-Capillary: 205 mg/dL — ABNORMAL HIGH (ref 70–99)
Glucose-Capillary: 212 mg/dL — ABNORMAL HIGH (ref 70–99)
Glucose-Capillary: 282 mg/dL — ABNORMAL HIGH (ref 70–99)

## 2019-08-28 MED ORDER — METOPROLOL TARTRATE 25 MG PO TABS
37.5000 mg | ORAL_TABLET | Freq: Two times a day (BID) | ORAL | Status: DC
  Administered 2019-08-28 – 2019-08-31 (×7): 37.5 mg
  Filled 2019-08-28 (×7): qty 1

## 2019-08-28 NOTE — Progress Notes (Signed)
Physical Therapy Session Note  Patient Details  Name: Andres [name] MRN: [id number] Date of Birth: 08-31-1940  Today's Date: 08/28/2019 PT Individual Time: 0900-0935 PT Individual Time Calculation (min): 35 min   Short Term Goals: Week 2:  PT Short Term Goal 1 (Week 2): Pt will transfer to WC in stedy PT Short Term Goal 2 (Week 2): Pt will tolerate sitting in WC >2 hours between therapies. PT Short Term Goal 3 (Week 2): Pt will perform sit<>stand with max assist of 1 PT Short Term Goal 4 (Week 2): Pt will maintain sitting balance with mod-max assist of 1 EOB up to 5 minutes  Skilled Therapeutic Interventions/Progress Updates: Pt presented in bed eyes closed but restless. Pt noted to be incontinent of bladder and pad under pt soiled.  Pt performed rolling L/R maxA x 2 to change brief and pad with PTA performing peri-care total A. Pt demonstrating no initiation when attempting to perform bed mobility. Pt transferred to sitting total A x 2 with pt reaching for footboard instantly causing pushing to L. PTA was able to release hand from footboard and pt was able to briefly sit at EOB with modA then requiring maxA due to heavy posterior pushing. Pt able to improve midline orientation with max multimodal cues however unable to maintain due to heavy posterior lean. Pt was able to sit at EOB approx 5 min with maxA 1-2 with pt more resistive when repositioning. Pt returned to supine total A x 2 and respositioned in bed dependent x 2 with bed in Trendelenburg. Pt repositioned and left with bed alarm on, and pt calm.       Therapy Documentation Precautions:  Precautions Precautions: Fall Precaution Comments: Lt hemi Restrictions Weight Bearing Restrictions: No General: PT Amount of Missed Time (min): 25 Minutes PT Missed Treatment Reason: Patient fatigue Vital Signs: Therapy Vitals Pulse Rate: 97 BP: (!) 187/96 Patient Position (if appropriate): Sitting    Therapy/Group: Individual  Therapy  Ayeza Therriault 08/28/2019, 10:00 AM

## 2019-08-28 NOTE — Progress Notes (Signed)
Pt had a better night, less restless, one longer period of sleep after having a large BM. Seems mostly restless after changing  (heavy wetter overnights.) and takes some time to get comfortable and return to sleep. This am he did respond verbally to good morning.

## 2019-08-28 NOTE — Progress Notes (Signed)
PHYSICAL MEDICINE & REHABILITATION PROGRESS NOTE  Subjective/Complaints: Seemed to sleep a little better last night. A bit restless in bed when I entered today  ROS: Limited due to cognitive/behavioral     Objective: Vital Signs: Blood pressure (!) 187/96, pulse 97, temperature 98.3 F (36.8 C), temperature source Axillary, resp. rate 18, weight 94.9 kg, SpO2 94 %. CT HEAD WO CONTRAST  Result Date: 08/26/2019 CLINICAL DATA:  Cerebral hemorrhage suspected. Additional history provided: Left-sided weakness with dysarthria and dysphagia secondary to acute right MCA infarction. EXAM: CT HEAD WITHOUT CONTRAST TECHNIQUE: Contiguous axial images were obtained from the base of the skull through the vertex without intravenous contrast. COMPARISON:  Head CT 08/14/2019 FINDINGS: Brain: Again demonstrated is a large right MCA vascular territory subacute infarct. Portions of the infarcted territory demonstrate fogging on the current examination. There has been a significant interval decrease in associated swelling and mass effect. No evidence of hemorrhagic conversion. No new demarcated infarct is identified. No midline shift, hydrocephalus or extra-axial fluid collection. Background chronic small vessel ischemic disease, unchanged. Mild generalized parenchymal atrophy. Vascular: No definite hyperdense vessel. However, a short segment occlusion was demonstrated at the right M1/M2 junction on prior CTA head/neck 08/09/2019. Skull: Normal. Negative for fracture or focal lesion. Sinuses/Orbits: Visualized orbits demonstrate no acute abnormality. No significant paranasal sinus disease or mastoid effusion at the imaged levels. Other: Redemonstrated right suboccipital lipoma. 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 No evidence of hemorrhagic conversion. Significant interval decrease in associated mass effect as compared to 08/14/2019. No new demarcated infarct is identified. Stable  background generalized parenchymal atrophy and chronic small vessel ischemic disease. Electronically Signed   By: Kellie Simmering DO   On: 08/26/2019 13:35   No results for input(s): WBC, HGB, HCT, PLT in the last 72 hours. Recent Labs    08/26/19 0526  CREATININE 1.44*    Physical Exam: BP (!) 187/96 (BP Location: Right Arm)   Pulse 97   Temp 98.3 F (36.8 C) (Axillary)   Resp 18   Wt 94.9 kg   SpO2 94%   BMI 32.77 kg/m  Constitutional: No distress . Vital signs reviewed. HEENT: EOMI, oral membranes moist Neck: supple Cardiovascular: RRR without murmur. No JVD    Respiratory: CTA Bilaterally without wheezes or rales. Normal effort    GI: BS +, non-tender, non-distended   GU: ongoing scrotal edema, penile shaft not visualized, able to see meatus   Skin: Warm and dry.  Intact. Psych: slow to arouse, arouses to verbal and tactile stim  Musc: Lower extremity edema. Neurological:  Alert makes eye contact, distracted Dysarthria/anarthric Motor: 3- Left shouder , hip flexor and knee ext, (spont movement not to command )--no changes Sensory no withdrawal to pinch  Severe Left neglect without change  Assessment/Plan: 1. Functional deficits secondary to right MCA infarct which require 3+ hours per day of interdisciplinary therapy in a comprehensive inpatient rehab setting.  Physiatrist is providing close team supervision and 24 hour management of active medical problems listed below.  Physiatrist and rehab team continue to assess barriers to discharge/monitor patient progress toward functional and medical goals  Care Tool:  Bathing        Body parts bathed by helper: (whole body)     Bathing assist Assist Level: 2 Helpers     Upper Body Dressing/Undressing Upper body dressing   What is the patient wearing?: Hospital gown only    Upper body assist Assist Level: Dependent - Patient 0%  Lower Body Dressing/Undressing Lower body dressing      What is the patient  wearing?: Incontinence brief     Lower body assist Assist for lower body dressing: 2 Helpers     Toileting Toileting    Toileting assist Assist for toileting: Maximal Assistance - Patient 25 - 49%     Transfers Chair/bed transfer  Transfers assist     Chair/bed transfer assist level: Dependent - mechanical lift     Locomotion Ambulation   Ambulation assist   Ambulation activity did not occur: Safety/medical concerns          Walk 10 feet activity   Assist  Walk 10 feet activity did not occur: Safety/medical concerns        Walk 50 feet activity   Assist Walk 50 feet with 2 turns activity did not occur: Safety/medical concerns         Walk 150 feet activity   Assist Walk 150 feet activity did not occur: Safety/medical concerns         Walk 10 feet on uneven surface  activity   Assist Walk 10 feet on uneven surfaces activity did not occur: Safety/medical concerns         Wheelchair     Assist   Type of Wheelchair: Manual    Wheelchair assist level: Dependent - Patient 0% Max wheelchair distance: 150    Wheelchair 50 feet with 2 turns activity    Assist        Assist Level: Dependent - Patient 0%   Wheelchair 150 feet activity     Assist     Assist Level: Dependent - Patient 0%      Medical Problem List and Plan: 1.  Left side weakness with dysarthria and dysphagia secondary to acute right MCA infarction  Continue CIR PT,  OT ,SLP-limited goals Mod A, may need SNF  Prognosis guarded due to size of R MCA infarct, basically entire MCA territory   2.  Antithrombotics: -DVT/anticoagulation: Lovenox             -antiplatelet therapy: Aspirin 81 mg daily, Plavix 75 mg daily x3 months then Plavix alone 3. Pain Management: Voltaren gel as needed 4. Mood: Provide emotional support             -antipsychotic agents: N/A 5. Neuropsych: This patient is not capable of making decisions on his own behalf. 6.  Skin/Wound Care: Routine skin checks 7. Fluids/Electrolytes/Nutrition: Routine in and outs.     8.  Post stroke dysphagia.        N.p.o.             Nasogastric tube feeds.               Advance diet when more alert/able to tolerate 9.  Seizure disorder.  Continue Keppra 750 mg every 12 hours, EEG negative  No seizures since admission to rehab 10.  Essential hypertension.  Lopressor 25 mg twice daily   Vitals:   08/28/19 0415 08/28/19 0817  BP: (!) 147/99 (!) 187/96  Pulse: 93 97  Resp: 18   Temp: 98.3 F (36.8 C)   SpO2: 94%   Hold BB for HR < 60   1/24  bp remains elevated over last 24+ hours   -will increase lopressor to 37.5mg  bid   11.  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  Hemoglobin A1c 6.2. CBG (last 3)  Recent Labs    08/28/19 0012 08/28/19 0359  08/28/19 0734  GLUCAP 180* 112* 205*               1/24 control has been fair to borderline, sugars a little higher in AM, receiving bolus feeds 5 x daily.  consider adding lantus in AM if numbers dont' improve 12.  CKD stage III.              recheck labs in AM 13.  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  Latest blood cultures question contaminant per infectious disease.  Ancef completed 08/19/2019.- afebrile              Repeat blood culture no growth 14.  CAD with history of CABG 1997.  Continue aspirin and Plavix 15.  Hyperlipidemia: Continue Lipitor 16.  BPH.  Flomax 0.4 mg daily.               incont with low residuals  17.  Morbid obesity.  BMI 35.70.  Dietary follow-up 18.  Acute blood loss anemia Resolved   Hemoglobin 12.3 on 1/14, 14.7 on 1/18    19.  Lethargy may be sleep wake cycle related, unremarkable CBC, BMET, CXR  neg start ritalin trial   1/23 -increasedritalin to 10mg   Continue sleep chart   LOS: 9 days A FACE TO FACE EVALUATION WAS PERFORMED  Meredith Staggers 08/28/2019, 10:15 AM

## 2019-08-28 NOTE — Plan of Care (Signed)
  Problem: Consults Goal: RH GENERAL PATIENT EDUCATION Description: See Patient Education module for education specifics. Outcome: Progressing Goal: Skin Care Protocol Initiated - if Braden Score 18 or less Description: If consults are not indicated, leave blank or document N/A Outcome: Progressing Goal: Nutrition Consult-if indicated Outcome: Progressing Goal: Diabetes Guidelines if Diabetic/Glucose > 140 Description: If diabetic or lab glucose is > 140 mg/dl - Initiate Diabetes/Hyperglycemia Guidelines & Document Interventions  Outcome: Progressing   Problem: RH BOWEL ELIMINATION Goal: RH STG MANAGE BOWEL WITH ASSISTANCE Description: STG Manage Bowel with mod Assistance. Outcome: Progressing Goal: RH STG MANAGE BOWEL W/MEDICATION W/ASSISTANCE Description: STG Manage Bowel with Medication with min Assistance. Outcome: Progressing   Problem: RH BLADDER ELIMINATION Goal: RH STG MANAGE BLADDER WITH ASSISTANCE Description: STG Manage Bladder With mod Assistance Outcome: Progressing   Problem: RH SKIN INTEGRITY Goal: RH STG SKIN FREE OF INFECTION/BREAKDOWN Description: No new breakdown prior to discharge Outcome: Progressing Goal: RH STG MAINTAIN SKIN INTEGRITY WITH ASSISTANCE Description: STG Maintain Skin Integrity With  mod Assistance. Outcome: Progressing   Problem: RH SAFETY Goal: RH STG ADHERE TO SAFETY PRECAUTIONS W/ASSISTANCE/DEVICE Description: STG Adhere to Safety Precautions With  mod Assistance/Device. Outcome: Progressing   Problem: RH PAIN MANAGEMENT Goal: RH STG PAIN MANAGED AT OR BELOW PT'S PAIN GOAL Description: Less than 4 Outcome: Progressing   Problem: RH KNOWLEDGE DEFICIT GENERAL Goal: RH STG INCREASE KNOWLEDGE OF SELF CARE AFTER HOSPITALIZATION Description: Patient 's family will be able to verbalize how to assist patient in self care prior to discharge Outcome: Progressing

## 2019-08-29 ENCOUNTER — Inpatient Hospital Stay (HOSPITAL_COMMUNITY): Payer: Medicare (Managed Care) | Admitting: Occupational Therapy

## 2019-08-29 ENCOUNTER — Inpatient Hospital Stay (HOSPITAL_COMMUNITY): Payer: Medicare (Managed Care) | Admitting: Speech Pathology

## 2019-08-29 ENCOUNTER — Inpatient Hospital Stay (HOSPITAL_COMMUNITY): Payer: Medicare (Managed Care)

## 2019-08-29 LAB — BASIC METABOLIC PANEL
Anion gap: 16 — ABNORMAL HIGH (ref 5–15)
BUN: 49 mg/dL — ABNORMAL HIGH (ref 8–23)
CO2: 25 mmol/L (ref 22–32)
Calcium: 10.2 mg/dL (ref 8.9–10.3)
Chloride: 100 mmol/L (ref 98–111)
Creatinine, Ser: 1.64 mg/dL — ABNORMAL HIGH (ref 0.61–1.24)
GFR calc Af Amer: 45 mL/min — ABNORMAL LOW (ref >60)
GFR calc non Af Amer: 39 mL/min — ABNORMAL LOW (ref >60)
Glucose, Bld: 142 mg/dL — ABNORMAL HIGH (ref 70–99)
Potassium: 5.8 mmol/L — ABNORMAL HIGH (ref 3.5–5.1)
Sodium: 141 mmol/L (ref 135–145)

## 2019-08-29 LAB — GLUCOSE, CAPILLARY
Glucose-Capillary: 128 mg/dL — ABNORMAL HIGH (ref 70–99)
Glucose-Capillary: 155 mg/dL — ABNORMAL HIGH (ref 70–99)
Glucose-Capillary: 161 mg/dL — ABNORMAL HIGH (ref 70–99)
Glucose-Capillary: 159 mg/dL — ABNORMAL HIGH (ref 70–99)
Glucose-Capillary: 200 mg/dL — ABNORMAL HIGH (ref 70–99)
Glucose-Capillary: 162 mg/dL — ABNORMAL HIGH (ref 70–99)

## 2019-08-29 MED ORDER — SODIUM POLYSTYRENE SULFONATE 15 GM/60ML PO SUSP
30.0000 g | Freq: Once | ORAL | Status: AC
  Administered 2019-08-29: 17:00:00 30 g
  Filled 2019-08-29: qty 120

## 2019-08-29 MED ORDER — FREE WATER
200.0000 mL | Freq: Three times a day (TID) | Status: DC
  Administered 2019-08-29 – 2019-09-02 (×12): 200 mL

## 2019-08-29 NOTE — Progress Notes (Signed)
[place] PHYSICAL MEDICINE & REHABILITATION PROGRESS NOTE  Subjective/Complaints:   Remains essentially non verbal   ROS: Limited due to cognitive/behavioral     Objective: Vital Signs: Blood pressure (!) 148/94, pulse 100, temperature 98.8 F (37.1 C), resp. rate 18, weight 94.9 kg, SpO2 99 %. No results found. No results for input(s): WBC, HGB, HCT, PLT in the last 72 hours. No results for input(s): NA, K, CL, CO2, GLUCOSE, BUN, CREATININE, CALCIUM in the last 72 hours.  Physical Exam: BP (!) 148/94 (BP Location: Right Arm)   Pulse 100   Temp 98.8 F (37.1 C)   Resp 18   Wt 94.9 kg   SpO2 99%   BMI 32.77 kg/m  Constitutional: No distress . Vital signs reviewed. HEENT: EOMI, oral membranes moist Neck: supple Cardiovascular: RRR without murmur. No JVD    Respiratory: CTA Bilaterally without wheezes or rales. Normal effort    GI: BS +, non-tender, non-distended   GU: ongoing scrotal edema, penile shaft not visualized, able to see meatus   Skin: Warm and dry.  Intact. Psych: slow to arouse, arouses to verbal and tactile stim  Musc: Lower extremity edema. Neurological:  Alert makes eye contact, distracted Dysarthria/anarthric Motor: 3- Left shouder , hip flexor and knee ext, (spont movement not to command )--no changes Sensory no withdrawal to pinch  Severe Left neglect without change  Assessment/Plan: 1. Functional deficits secondary to right MCA infarct which require 3+ hours per day of interdisciplinary therapy in a comprehensive inpatient rehab setting.  Physiatrist is providing close team supervision and 24 hour management of active medical problems listed below.  Physiatrist and rehab team continue to assess barriers to discharge/monitor patient progress toward functional and medical goals  Care Tool:  Bathing        Body parts bathed by helper: (whole body)     Bathing assist Assist Level: 2 Helpers     Upper Body Dressing/Undressing Upper body  dressing   What is the patient wearing?: Hospital gown only    Upper body assist Assist Level: Dependent - Patient 0%    Lower Body Dressing/Undressing Lower body dressing      What is the patient wearing?: Incontinence brief     Lower body assist Assist for lower body dressing: 2 Helpers     Toileting Toileting    Toileting assist Assist for toileting: Maximal Assistance - Patient 25 - 49%     Transfers Chair/bed transfer  Transfers assist     Chair/bed transfer assist level: Dependent - mechanical lift     Locomotion Ambulation   Ambulation assist   Ambulation activity did not occur: Safety/medical concerns          Walk 10 feet activity   Assist  Walk 10 feet activity did not occur: Safety/medical concerns        Walk 50 feet activity   Assist Walk 50 feet with 2 turns activity did not occur: Safety/medical concerns         Walk 150 feet activity   Assist Walk 150 feet activity did not occur: Safety/medical concerns         Walk 10 feet on uneven surface  activity   Assist Walk 10 feet on uneven surfaces activity did not occur: Safety/medical concerns         Wheelchair     Assist   Type of Wheelchair: Manual    Wheelchair assist level: Dependent - Patient 0% Max wheelchair distance: 150    Wheelchair  50 feet with 2 turns activity    Assist        Assist Level: Dependent - Patient 0%   Wheelchair 150 feet activity     Assist     Assist Level: Dependent - Patient 0%      Medical Problem List and Plan: 1.  Left side weakness with dysarthria and dysphagia secondary to acute right MCA infarction  Continue CIR PT,  OT ,SLP-limited goals Mod A, may need SNF  Prognosis guarded due to size of R MCA infarct, basically entire MCA territory   2.  Antithrombotics: -DVT/anticoagulation: Lovenox             -antiplatelet therapy: Aspirin 81 mg daily, Plavix 75 mg daily x3 months then Plavix alone 3. Pain  Management: Voltaren gel as needed 4. Mood: Provide emotional support             -antipsychotic agents: N/A 5. Neuropsych: This patient is not capable of making decisions on his own behalf. 6. Skin/Wound Care: Routine skin checks 7. Fluids/Electrolytes/Nutrition: Routine in and outs.     8.  Post stroke dysphagia.        N.p.o.             Nasogastric tube feeds.               Advance diet when more alert/able to tolerate 9.  Seizure disorder.  after discussion with daughter , reduced  Keppra  500mg  BID to reduce lethargy no seizures  EEG negative  No seizures since admission to rehab 10.  Essential hypertension.  Lopressor 25 mg twice daily   Vitals:   08/28/19 1932 08/29/19 0358  BP: (!) 160/74 (!) 148/94  Pulse: 98 100  Resp: 17 18  Temp: (!) 97.5 F (36.4 C) 98.8 F (37.1 C)  SpO2: 90% 99%  Hold BB for HR < 60   1/24  bp remains elevated over last 24+ hours   increase lopressor to 37.5mg  bid on 1/24   11.  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  Hemoglobin A1c 6.2. CBG (last 3)  Recent Labs    08/28/19 2055 08/28/19 2359 08/29/19 0352  GLUCAP 212* 155* 128*               am CBG ok  1/25 12.  CKD stage III.              recheck labs in AM 13.  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  Latest blood cultures question contaminant per infectious disease.  Ancef completed 08/19/2019.- afebrile              Repeat blood culture no growth 14.  CAD with history of CABG 1997.  Continue aspirin and Plavix 15.  Hyperlipidemia: Continue Lipitor 16.  BPH.  Flomax 0.4 mg daily.               incont with low residuals  17.  Morbid obesity.  BMI 35.70.  Dietary follow-up 18.  Acute blood loss anemia Resolved   Hemoglobin 12.3 on 1/14, 14.7 on 1/18    19.  Lethargy may be sleep wake cycle related, unremarkable CBC, BMET, CXR  neg start ritalin trial   1/23 -increasedritalin to 10mg - monitor for 2 more days and discuss with team at conf   Continue  sleep chart   LOS: 10 days A FACE TO [place] Atianna Haidar 08/29/2019, 7:42 AM

## 2019-08-29 NOTE — Progress Notes (Signed)
Pt slept better again tonight, likes to lay on right side more than supine or on his left. Again waking when soiled and restless for a little while afterward.

## 2019-08-29 NOTE — Progress Notes (Signed)
Speech Language Pathology Daily Session Note  Patient Details  Name: Andres [name] MRN: [id number] Date of Birth: 07-Mar-1941  Today's Date: 08/29/2019 SLP Individual Time: 1016-1046 SLP Individual Time Calculation (min): 30 min  Short Term Goals: Week 2: SLP Short Term Goal 1 (Week 2): Pt will consume therapeutic trials of thin liquids, ice chips, or purees with max assist multimodal cues for use of swallowing precautions and minimal overt s/s of aspiratin over 3 consecutive sessions prior to instrumental assessment. SLP Short Term Goal 2 (Week 2): Pt will focus his attention to stimuli in >25% of opportunities with max assist multimodal cues. SLP Short Term Goal 3 (Week 2): Pt will remain alert for >1 minute with max assist multimodal cues. SLP Short Term Goal 4 (Week 2): Pt will orient to place, date, and situation with max assist multimodal cues. SLP Short Term Goal 5 (Week 2): Pt will visually scan to the left of midline in >25% of opportunities during basic structured tasks with max assist multimodal cues.  Skilled Therapeutic Interventions: Pt was seen for skilled ST targeting cognitive goals. Pt still lethargic today however with improved ability to maintain eyes open and stay awake for anywhere from 2-4 minute intervals prior to closing eyes when provided Max A multimodal cues. However, pt's task initiation continues to be severely impacted and limiting in therapy. Pt did not respond to any of therapists' questions regarding state of being, biographical questions, as well as those about interests Management consultant music). Pt achieved focused attention in 2 instances (with direct eye contact with therapist) with Max A multimodal cues and stimulation. Otherwise, pt's gaze was fixed to right side of environment. Total A required to locate midline and maintain upright positioning/correct heavy left lean throughout session. Total A was also required for basic self care tasks due to severe impairments in  attention and initiation. Pt with seemingly improved secretion management today; none noted to be spilling out of left side of oral cavity. Despite slight improvements in alertness and secretion management, when presented with ice chip pt closed his lips. No POs were accepted by pt. Given severe cognitive impairments, suspect pt will require long term means of nutrition prior to the safe initiation of any PO diet. Pt left sitting in wheelchair at nursing station with supervision for safety in wheelchair. Continue per current plan of care.        Pain Pain Assessment Pain Scale: Faces Pain Score: 0-No pain Faces Pain Scale: No hurt  Therapy/Group: Individual Therapy  Arbutus Leas 08/29/2019, 12:13 PM

## 2019-08-29 NOTE — Progress Notes (Signed)
Physical Therapy Session Note  Patient Details  Name: Andres [name] MRN: [id number] Date of Birth: [birth year]  Today's Date: 08/29/2019 PT Individual Time:  -      Short Term Goals: Week 2:  PT Short Term Goal 1 (Week 2): Pt will transfer to WC in [place] PT Short Term Goal 2 (Week 2): Pt will tolerate sitting in WC >2 hours between therapies. PT Short Term Goal 3 (Week 2): Pt will perform sit<>stand with max assist of 1 PT Short Term Goal 4 (Week 2): Pt will maintain sitting balance with mod-max assist of 1 EOB up to 5 minutes  Skilled Therapeutic Interventions/Progress Updates: Pt presented in bed with daughter [name] present. During session PTA provided edu to pt's current deficits including, poor initiation, delayed processing, difficulty/inability to follow commands, pushing syndrome, and poor attention. Pt rolled to L maxA x 2 to check brief with pt being clean/dry. Performed supine to sit maxA x 2 with heavy posterior lean and pt pushing to L. PTA sat to R of pt and had pt perform R lateral lean with R arm on pt's lap. Pt initially required maxA x2 for sitting balance however with increased time was able to progress to minA with no posterior lean. Pt then attempted STS in [place], performed maxA x 2 requiring x 2 attempts and max facilitation at hips. Pt was able to come to full standing in [place] then required increased time and max cues to sit in [place]. Pt then attempted additional stands in [place] with PTA and tech providing cues for pt to relax. After a few moments was able to sit in [place] without posterior lean and maintain midline with R hand removed from [place]. Pt then transferred to [place]. Transported to rehab gym and attempted seated reaching for attention to task with pt reaching for green dowel. Pt then provided [facility] assist to reach for bar and required max cues to release bar. Pt also noted to close eyes and nod head after a few minutes. Pt returned to room at end of session and transferred  back to bed. Required maxA x 2 STS from TIS and total A x 2 sit to supine once at EOB. Pt promptly falling asleep once in bed. Pt left with bed alarm on, call bell within reach and needs met.      Therapy Documentation Precautions:  Precautions Precautions: Fall Precaution Comments: Lt hemi Restrictions Weight Bearing Restrictions: No General:   Vital Signs: Therapy Vitals Temp: 98.8 F (37.1 C) Pulse Rate: 100 Resp: 18 BP: (!) 148/94 Patient Position (if appropriate): Lying Oxygen Therapy SpO2: 99 % O2 Device: Room Air Pain:   Mobility:   Locomotion :    Trunk/Postural Assessment :    Balance:   Exercises:   Other Treatments:      Therapy/Group: Individual Therapy  Classie Weng 08/29/2019, 7:55 AM

## 2019-08-29 NOTE — Progress Notes (Signed)
Occupational Therapy Session Note  Patient Details  Name: Andres [name] MRN: [id number] Date of Birth: Sep 12, 1940  Today's Date: 08/29/2019 OT Individual Time: 0751-0900 and 1130-1200 OT Individual Time Calculation (min): 69 min and 30 mins   Short Term Goals: Week 1:  OT Short Term Goal 1 (Week 1): Pt will complete toilet or BSC transfer with 2 assist and LRAD OT Short Term Goal 1 - Progress (Week 1): Progressing toward goal OT Short Term Goal 2 (Week 1): Pt will initiate washing 1 body area when handed a wash cloth during bathing OT Short Term Goal 2 - Progress (Week 1): Not met OT Short Term Goal 3 (Week 1): Pt will assist with threading L UE into a shirt with max vcs OT Short Term Goal 3 - Progress (Week 1): Not met Week 2:  OT Short Term Goal 1 (Week 2): Pt will maintain sustained attention to 1 self care task for ~10 seconds with min HOH assist for initiation OT Short Term Goal 2 (Week 2): Pt will complete 1 self care task w/c level at the sink to increase OOB tolerance OT Short Term Goal 3 (Week 2): Pt will complete toilet or BSC transfer with +2 assist and LRAD  Skilled Therapeutic Interventions/Progress Updates:    Session 1:Upon entering the room, pt supine in bed and continues to be lethargic. Pt BP's in supine is 162/102 with RN and MD aware. OT provided pt with warm cloth but he was unable to initiate washing face on his own. OT attempting hand over hand assistance but pt is resistance. Pt rolling L <> R with total A and hand over hand assistance to initiate rolling and grabbing of bed rails to initiate movement. Second person needed to place maximove sling. Maximove utlized for transfer OOB and into tilt in space wheelchair.  BP taken of 188/91 seated and RN notified. OT assisted pt to is at RN station and positioned to facilitate pt looking towards the left and midline. Lap tray placed for safety and all needs within reach.    Session 2: Pt received at RN station with no signs  or symptoms of pain. OT assisting pt back to room via tilt in space wheelchair secondary to fatigue. Pt's daughter present in the room this session. +2 transfer from wheelchair >bed with use of [place] board for transfer. Sit >supine with +2 for safety to assist trunk and B LEs back into bed. Pt required total A +2 to roll and provide hygiene secondary to incontinence. Pt positioned in bed to protect the hemiplegic side. Caregiver with several questions related to pt's care in which therapist educated her on purpose of OT, goals, and pts current deficits. Pt remained supine with all needs within reach.   Therapy Documentation Precautions:  Precautions Precautions: Fall Precaution Comments: Lt hemi Restrictions Weight Bearing Restrictions: No   Pain: Pain Assessment Pain Scale: Faces Pain Score: 0-No pain Faces Pain Scale: No hurt ADL: ADL Eating: Not assessed Grooming: Dependent Where Assessed-Grooming: Bed level Upper Body Bathing: Dependent Where Assessed-Upper Body Bathing: Edge of bed Lower Body Bathing: Dependent Where Assessed-Lower Body Bathing: Bed level Upper Body Dressing: Dependent Where Assessed-Upper Body Dressing: Edge of bed Lower Body Dressing: Dependent Where Assessed-Lower Body Dressing: Bed level Toileting: Not assessed Toilet Transfer: Not assessed Tub/Shower Transfer: Not assessed ADL Comments: Pt is dependent +2 assist during self care tasks excluding grooming (+1 assist)   Therapy/Group: Individual Therapy  Gypsy Decant 08/29/2019, 12:20 PM

## 2019-08-30 ENCOUNTER — Inpatient Hospital Stay (HOSPITAL_COMMUNITY): Payer: Medicare (Managed Care)

## 2019-08-30 ENCOUNTER — Inpatient Hospital Stay (HOSPITAL_COMMUNITY): Payer: Medicare (Managed Care) | Admitting: Occupational Therapy

## 2019-08-30 ENCOUNTER — Inpatient Hospital Stay (HOSPITAL_COMMUNITY): Payer: Medicare (Managed Care) | Admitting: *Deleted

## 2019-08-30 LAB — GLUCOSE, CAPILLARY
Glucose-Capillary: 110 mg/dL — ABNORMAL HIGH (ref 70–99)
Glucose-Capillary: 121 mg/dL — ABNORMAL HIGH (ref 70–99)
Glucose-Capillary: 123 mg/dL — ABNORMAL HIGH (ref 70–99)
Glucose-Capillary: 148 mg/dL — ABNORMAL HIGH (ref 70–99)
Glucose-Capillary: 207 mg/dL — ABNORMAL HIGH (ref 70–99)
Glucose-Capillary: 221 mg/dL — ABNORMAL HIGH (ref 70–99)
Glucose-Capillary: 262 mg/dL — ABNORMAL HIGH (ref 70–99)

## 2019-08-30 LAB — BASIC METABOLIC PANEL
Anion gap: 14 (ref 5–15)
BUN: 51 mg/dL — ABNORMAL HIGH (ref 8–23)
CO2: 30 mmol/L (ref 22–32)
Calcium: 10.3 mg/dL (ref 8.9–10.3)
Chloride: 101 mmol/L (ref 98–111)
Creatinine, Ser: 1.77 mg/dL — ABNORMAL HIGH (ref 0.61–1.24)
GFR calc Af Amer: 41 mL/min — ABNORMAL LOW (ref >60)
GFR calc non Af Amer: 35 mL/min — ABNORMAL LOW (ref >60)
Glucose, Bld: 152 mg/dL — ABNORMAL HIGH (ref 70–99)
Potassium: 4.2 mmol/L (ref 3.5–5.1)
Sodium: 145 mmol/L (ref 135–145)

## 2019-08-30 MED ORDER — PRO-STAT SUGAR FREE PO LIQD
30.0000 mL | Freq: Two times a day (BID) | ORAL | Status: DC
  Administered 2019-08-30 – 2019-09-09 (×19): 30 mL
  Filled 2019-08-30 (×19): qty 30

## 2019-08-30 NOTE — Progress Notes (Signed)
Occupational Therapy Session Note  Patient Details  Name: Andres [name] MRN: [id number] Date of Birth: 09-23-1940  Today's Date: 08/30/2019 OT Individual Time: 1345-1415 OT Individual Time Calculation (min): 30 min    Short Term Goals: Week 2:  OT Short Term Goal 1 (Week 2): Pt will maintain sustained attention to 1 self care task for ~10 seconds with min HOH assist for initiation OT Short Term Goal 2 (Week 2): Pt will complete 1 self care task w/c level at the sink to increase OOB tolerance OT Short Term Goal 3 (Week 2): Pt will complete toilet or BSC transfer with +2 assist and LRAD  Skilled Therapeutic Interventions/Progress Updates:    Patient seated in TIS w/c, requires cues to stay alert for 30 minute session.  Flat affect, no verbalization or visual tracking/fixation on object.  Moves bilateral LEs spontaneously and bilateral UEs with support/AAROM.  Holds wash cloth, paper etc in right hand but does not attempt to use functionally.  Turns away slightly with washing face/mouth.  Able to pull himself forward from backrest of w/c with cues/facilitation.  Completed bilateral UE PROM/AAROM, repositioning in TIS w/c.  He remained in w/c for next session.    Therapy Documentation Precautions:  Precautions Precautions: Fall Precaution Comments: Lt hemi Restrictions Weight Bearing Restrictions: No General:   Vital Signs: Therapy Vitals Temp: 98.6 F (37 C) Temp Source: Oral Pulse Rate: (!) 101 Resp: 20 BP: (!) 150/82 Patient Position (if appropriate): Sitting Pain: PAINAD (Pain Assessment in Advanced Dementia) Breathing: normal Negative Vocalization: none Facial Expression: smiling or inexpressive Body Language: relaxed Consolability: no need to console PAINAD Score: 0 Other Treatments:     Therapy/Group: Individual Therapy  Carlos Levering 08/30/2019, 2:23 PM

## 2019-08-30 NOTE — Progress Notes (Signed)
[place] PHYSICAL MEDICINE & REHABILITATION PROGRESS NOTE  Subjective/Complaints:  Not speaking but alert, per LPN incont of bowel and bladder    ROS: Limited due to cognitive/behavioral     Objective: Vital Signs: Blood pressure (!) 143/99, pulse 92, temperature 98.4 F (36.9 C), resp. rate 19, weight 94.9 kg, SpO2 98 %. No results found. No results for input(s): WBC, HGB, HCT, PLT in the last 72 hours. Recent Labs    08/29/19 0700 08/30/19 0541  NA 141 145  K 5.8* 4.2  CL 100 101  CO2 25 30  GLUCOSE 142* 152*  BUN 49* 51*  CREATININE 1.64* 1.77*  CALCIUM 10.2 10.3    Physical Exam: BP (!) 143/99   Pulse 92   Temp 98.4 F (36.9 C)   Resp 19   Wt 94.9 kg   SpO2 98%   BMI 32.77 kg/m  Constitutional: No distress . Vital signs reviewed. HEENT: EOMI, oral membranes moist Neck: supple Cardiovascular: RRR without murmur. No JVD    Respiratory: CTA Bilaterally without wheezes or rales. Normal effort    GI: BS +, non-tender, non-distended   GU: ongoing scrotal edema, penile shaft not visualized, able to see meatus   Skin: Warm and dry.  Intact. Psych: slow to arouse, arouses to verbal and tactile stim  Musc: Lower extremity edema. Neurological:  Alert makes eye contact, distracted Dysarthria/anarthric Motor: 3- Left shouder , hip flexor and knee ext, (spont movement not to command )--no changes Sensory no withdrawal to pinch  Severe Left neglect without change  Assessment/Plan: 1. Functional deficits secondary to right MCA infarct which require 3+ hours per day of interdisciplinary therapy in a comprehensive inpatient rehab setting.  Physiatrist is providing close team supervision and 24 hour management of active medical problems listed below.  Physiatrist and rehab team continue to assess barriers to discharge/monitor patient progress toward functional and medical goals  Care Tool:  Bathing        Body parts bathed by helper: (whole body)     Bathing  assist Assist Level: 2 Helpers     Upper Body Dressing/Undressing Upper body dressing   What is the patient wearing?: Hospital gown only    Upper body assist Assist Level: Dependent - Patient 0%    Lower Body Dressing/Undressing Lower body dressing      What is the patient wearing?: Incontinence brief     Lower body assist Assist for lower body dressing: 2 Helpers     Toileting Toileting    Toileting assist Assist for toileting: Maximal Assistance - Patient 25 - 49%     Transfers Chair/bed transfer  Transfers assist     Chair/bed transfer assist level: 2 Helpers     Locomotion Ambulation   Ambulation assist   Ambulation activity did not occur: Safety/medical concerns          Walk 10 feet activity   Assist  Walk 10 feet activity did not occur: Safety/medical concerns        Walk 50 feet activity   Assist Walk 50 feet with 2 turns activity did not occur: Safety/medical concerns         Walk 150 feet activity   Assist Walk 150 feet activity did not occur: Safety/medical concerns         Walk 10 feet on uneven surface  activity   Assist Walk 10 feet on uneven surfaces activity did not occur: Safety/medical concerns         Wheelchair  Assist   Type of Wheelchair: Manual    Wheelchair assist level: Dependent - Patient 0% Max wheelchair distance: 150    Wheelchair 50 feet with 2 turns activity    Assist        Assist Level: Dependent - Patient 0%   Wheelchair 150 feet activity     Assist     Assist Level: Dependent - Patient 0%      Medical Problem List and Plan: 1.  Left side weakness with dysarthria and dysphagia secondary to acute right MCA infarction  Continue CIR PT,  OT ,SLP-limited goals Mod A, may need SNF Spoke with daughter discussed need for PEG.  SHe will confer with her aunt whether this would be in accordance with pt's wishes.  DIscussed that usually dysphagia post stroke does  improve although level of alertness is also an issue    Prognosis guarded due to size of R MCA infarct, basically entire MCA territory   2.  Antithrombotics: -DVT/anticoagulation: Lovenox             -antiplatelet therapy: Aspirin 81 mg daily, Plavix 75 mg daily x3 months then Plavix alone 3. Pain Management: Voltaren gel as needed 4. Mood: Provide emotional support             -antipsychotic agents: N/A 5. Neuropsych: This patient is not capable of making decisions on his own behalf. 6. Skin/Wound Care: Routine skin checks 7. Fluids/Electrolytes/Nutrition: Routine in and outs.    Increase free H20, if GFR cont to fall may need renal consult Hyper K improved after Kayexalate 8.  Post stroke dysphagia.        N.p.o.             Nasogastric tube feeds.               Advance diet when more alert/able to tolerate 9.  Seizure disorder.  after discussion with daughter , reduced  Keppra  500mg  BID to reduce lethargy no seizures  EEG negative  No seizures since admission to rehab 10.  Essential hypertension.  Lopressor 25 mg twice daily   Vitals:   08/29/19 2002 08/30/19 0412  BP: (!) 157/83 (!) 143/99  Pulse: 87 92  Resp: 16 19  Temp: 98.3 F (36.8 C) 98.4 F (36.9 C)  SpO2: 97% 98%  Hold BB for HR < 60   1/24  bp remains elevated over last 24+ hours   increase lopressor to 37.5mg  bid on 1/24  Improving 1/26 11.  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  Hemoglobin A1c 6.2. CBG (last 3)  Recent Labs    08/30/19 0000 08/30/19 0409 08/30/19 0725  GLUCAP 148* 110* 121*               am CBG ok  1/25 12.  CKD stage III.  - worsening likely volume  Related             recheck labs in AM 13.  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  Latest blood cultures question contaminant per infectious disease.  Ancef completed 08/19/2019.- afebrile              Repeat blood culture no growth 14.  CAD with history of CABG 1997.  Continue aspirin and Plavix 15.   Hyperlipidemia: Continue Lipitor 16.  BPH.  Flomax 0.4 mg daily.               incont with low residuals  17.  Morbid obesity.  BMI  35.70.  Dietary follow-up 18.  Acute blood loss anemia Resolved   Hemoglobin 12.3 on 1/14, 14.7 on 1/18    19.  Lethargy may be sleep wake cycle related, unremarkable CBC, BMET, CXR  neg start ritalin trial   1/23 -increasedritalin to 10mg - monitor for 2 more days and discuss with team at conf - daughter has noticed some improvement discussed improvement of serebral edema noted   Continue sleep chart   LOS: 11 days A FACE TO FACE EVALUATION WAS PERFORMED  [name] 08/30/2019, 7:47 AM

## 2019-08-30 NOTE — Progress Notes (Signed)
Physical Therapy Session Note  Patient Details  Name: Andres [name] MRN: [id number] Date of Birth: 29-Mar-1941  Today's Date: 08/30/2019 PT Individual Time: 0805-0900 PT Individual Time Calculation (min): 55 min   Short Term Goals: Week 2:  PT Short Term Goal 1 (Week 2): Pt will transfer to WC in stedy PT Short Term Goal 2 (Week 2): Pt will tolerate sitting in WC >2 hours between therapies. PT Short Term Goal 3 (Week 2): Pt will perform sit<>stand with max assist of 1 PT Short Term Goal 4 (Week 2): Pt will maintain sitting balance with mod-max assist of 1 EOB up to 5 minutes  Skilled Therapeutic Interventions/Progress Updates: Pt presented in bed awake and alert. Performed supine to sit maxA x 1 with +2 for safety. Pt initially with heavy posterior lean and pushing to L however was able to decrease pushing with PTA sitting to R of pt and facilitating R lateral lean with pt's elbow on PTA's lap. Intermittent posterior lean noted. PTA then performed heavy lateral lean to R to allow Beasy board placement. Performed transfer with Beasy board maxA x 2 to TIS with pt initially attempting to lean posteriorly but was able to correct with PTA resting pt's head on shoulder. In chair PTA attempted to hand washcloth to pt to encourage wiping face however when placed in pt's hand would not close grasp on washcloth. PTA the providing HOH assist to wipe face with warm washcloth. Pt transported to rehab gym and attempted STS in parallel bars. With cues pt able to come to upright sitting however pt unable to initiate standing despite maxA x 2 efforts. Pt did maintain sitting upright in chair with back away from support x 5 min with PTA encouraging pt to turn head to midline. PTA then attempted STS in [place] with same result. PTA adjusted headrest to encourage pt's head in more midline vs turning to R. Pt left at nsg station at end of session with full lap tray in place.      Therapy Documentation Precautions:   Precautions Precautions: Fall Precaution Comments: Lt hemi Restrictions Weight Bearing Restrictions: No General:   Vital Signs:  Pain: Pain Assessment Pain Score: 0-No pain   Therapy/Group: Individual Therapy  Kayl Stogdill  Elza Sortor, PTA  08/30/2019, 12:22 PM

## 2019-08-30 NOTE — Progress Notes (Signed)
Occupational Therapy Session Note  Patient Details  Name: Andres [name] MRN: [id number] Date of Birth: April 26, 1941  Today's Date: 08/30/2019 OT Individual Time: [id number] OT Individual Time Calculation (min): 73 min    Short Term Goals:  Week 2:  OT Short Term Goal 1 (Week 2): Pt will maintain sustained attention to 1 self care task for ~10 seconds with min HOH assist for initiation OT Short Term Goal 2 (Week 2): Pt will complete 1 self care task w/c level at the sink to increase OOB tolerance OT Short Term Goal 3 (Week 2): Pt will complete toilet or BSC transfer with +2 assist and LRAD  Skilled Therapeutic Interventions/Progress Updates:   Pt received at RN station in tilt in space wheelchair. OT assisted pt via wheelchair to gym and placed into standing frame. Pt in standing frame for 15 minutes with BP of 146/88. Pt very lethargic while standing and needing total A for upright position. Pt returned to wheelchair and assisted back to room secondary to lethargy. Maxi move placed with total +2 for safety and pt transferred into bed. Pt's daughter present during session as well. Pt rolling L <> R with total A +2 to change brief and hygiene. Pt began urinate while attempting to perform hygiene. Pt repositioned and OT utilized suction toothbrush for oral care with total A. OT continued to provide education to caregiver about pt's participation, lack of progress towards goals, and stroke education. Caregiver actively participating in discussion and asking questions. SNF recommended by this therapist. Caregiver also verbalizing wanting to do PEG placement. RN and PA notified. Pt sleeping soundly in bed with call bell and all needed items within reach.     Therapy Documentation Precautions:  Precautions Precautions: Fall Precaution Comments: Lt hemi Restrictions Weight Bearing Restrictions: No General:   Vital Signs: Therapy Vitals Temp: 98.6 F (37 C) Temp Source: Oral Pulse Rate: (!)  101 Resp: 20 BP: (!) 150/82 Patient Position (if appropriate): Sitting Pain: PAINAD (Pain Assessment in Advanced Dementia) Breathing: normal Negative Vocalization: none Facial Expression: smiling or inexpressive Body Language: relaxed Consolability: no need to console PAINAD Score: 0 ADL: ADL Eating: Not assessed Grooming: Dependent Where Assessed-Grooming: Bed level Upper Body Bathing: Dependent Where Assessed-Upper Body Bathing: Edge of bed Lower Body Bathing: Dependent Where Assessed-Lower Body Bathing: Bed level Upper Body Dressing: Dependent Where Assessed-Upper Body Dressing: Edge of bed Lower Body Dressing: Dependent Where Assessed-Lower Body Dressing: Bed level Toileting: Not assessed Toilet Transfer: Not assessed Tub/Shower Transfer: Not assessed ADL Comments: Pt is dependent +2 assist during self care tasks excluding grooming (+1 assist)   Therapy/Group: Individual Therapy  Gypsy Decant 08/30/2019, 4:25 PM

## 2019-08-30 NOTE — Progress Notes (Signed)
Nutrition Follow-up  RD working remotely.  DOCUMENTATION CODES:   Obesity unspecified  INTERVENTION:   Recommend considering PEG tube placement.  Continue bolus tube feeds via Cortrak: - 1 carton (237 ml) of Osmolite 1.5 cal formula 5 X daily - Pro-stat 30 ml BID - Free water per MD/PA, currently 200 ml TID  Bolus tube feeding regimen provides 1978 kcal, 104 grams of protein, and 1503 ml of H2O.   NUTRITION DIAGNOSIS:   Inadequate oral intake related to inability to eat, lethargy/confusion as evidenced by NPO status.  Ongoing, being addressed via TF  GOAL:   Patient will meet greater than or equal to 90% of their needs  Met via TF  MONITOR:   TF tolerance, Labs, Weight trends, I & O's  REASON FOR ASSESSMENT:   Consult Enteral/tube feeding initiation and management  ASSESSMENT:   79 year old right-handed male with history of hypertension, CKD stage III, morbid obesity with BMI 35.70, hyperlipidemia, history of seizure maintained on Keppra, CAD status post CABG, history of CVA maintained on aspirin 81 mg daily and Plavix, carotid artery stenosis, mitral valve prolapse, diet-controlled diabetes mellitus, BPH and remote tobacco abuse. Admitted to rehab, s/p acute rt MCA infarct surroudning of R MCA parietal infarct, d/t right M1 occulusion.  Pt remains NPO with bolus tube feeds via gastric Cortrak. Per MD note today, MD discussed PEG with daughter who will confer with aunt to determine whether this would be in accordance with pt's wishes.  Discussed pt with RN. RN reports pt tolerating TF without issue. MD/PA managing free water flushes.  Pt with an 11 lb weight loss since admission to CIR. Will add an additional Pro-stat to tube feeding regimen. RD will continue to monitor trends.  Medications reviewed and include: SSI, Ritalin  Labs reviewed. CBG's: 110-221 x 24 hours  Diet Order:   Diet Order            Diet NPO time specified  Diet effective now              EDUCATION NEEDS:   No education needs have been identified at this time  Skin:  Skin Assessment: Reviewed RN Assessment (MASD to buttocks)  Last BM:  08/30/19 large type 6  Height:   Ht Readings from Last 1 Encounters:  08/10/19 5' 7"  (1.702 m)    Weight:   Wt Readings from Last 1 Encounters:  08/27/19 94.9 kg    Ideal Body Weight:  67.27 kg  BMI:  Body mass index is 32.77 kg/m.  Estimated Nutritional Needs:   Kcal:  1850-2050  Protein:  90-105g  Fluid:  2L/day    Gaynell Face, MS, RD, LDN Inpatient Clinical Dietitian Pager: [phone] Weekend/After Hours: [phone]

## 2019-08-30 NOTE — Progress Notes (Signed)
Pt slept very well overnight. Only waking when he is soiled, returning back to sleep afterwards. He was much more alert and making attempts to talk at beginning of shift tonight.

## 2019-08-30 NOTE — Progress Notes (Signed)
Speech Language Pathology Daily Session Note  Patient Details  Name: Andres [name] MRN: [id number] Date of Birth: 11-19-1940  Today's Date: 08/30/2019 SLP Individual Time: 0901-0940 SLP Individual Time Calculation (min): 39 min  Short Term Goals: Week 2: SLP Short Term Goal 1 (Week 2): Pt will consume therapeutic trials of thin liquids, ice chips, or purees with max assist multimodal cues for use of swallowing precautions and minimal overt s/s of aspiratin over 3 consecutive sessions prior to instrumental assessment. SLP Short Term Goal 2 (Week 2): Pt will focus his attention to stimuli in >25% of opportunities with max assist multimodal cues. SLP Short Term Goal 3 (Week 2): Pt will remain alert for >1 minute with max assist multimodal cues. SLP Short Term Goal 4 (Week 2): Pt will orient to place, date, and situation with max assist multimodal cues. SLP Short Term Goal 5 (Week 2): Pt will visually scan to the left of midline in >25% of opportunities during basic structured tasks with max assist multimodal cues.  Skilled Therapeutic Interventions: Skilled ST services focused on swallow and cognitive skills. SLP attempted to provided oral care and trials of ice chips, however sealed lips closed despite max A verbal/visual and tactile cues. Pt continues to demonstrate improved secretion management, no anterior spillage nor s/s aspiration noted during treatment session, however attempts of PO trials are limited by pt's reduced participation. Pt produced a two word approximation unintelligible utterance, that SLP was unable to clarify with request to repetition and multimodal cues to increase understanding of vocalized communication. Pt demonstrated focused attention x4 with eye gaze/ eye contact with SLP during attempts to engage pt with verbal and tactile cues. SLP attempted to engaged focused attention and left scanning with salient red object, pt was unable to demonstrate focused attention on object  right of midline nor track object towards left of midline. SLP played prefer music, enaging pt with hand over hand motion to accompany melody of music, however pt was unable to carryover movement and demonstrated focused attention on hand/SLP. Pt demonstrated alertness in 20 second intervals given max A verbal/tatcile cues. Pt missed 20 minutes of treatment time due to reduce alertness. Pt was returned to behind the nurses station to increase alertness. ST recommends to continue skilled ST services.      Pain Pain Assessment Pain Scale: Faces Pain Score: 0-No pain  Therapy/Group: Individual Therapy  Andres [name]  [facility] 08/30/2019, 9:48 AM

## 2019-08-31 ENCOUNTER — Inpatient Hospital Stay (HOSPITAL_COMMUNITY): Payer: Medicare (Managed Care)

## 2019-08-31 ENCOUNTER — Inpatient Hospital Stay (HOSPITAL_COMMUNITY): Payer: Medicare (Managed Care) | Admitting: Physical Therapy

## 2019-08-31 ENCOUNTER — Telehealth: Payer: Self-pay

## 2019-08-31 ENCOUNTER — Inpatient Hospital Stay (HOSPITAL_COMMUNITY): Payer: Medicare (Managed Care) | Admitting: Speech Pathology

## 2019-08-31 ENCOUNTER — Inpatient Hospital Stay (HOSPITAL_COMMUNITY): Payer: Medicare (Managed Care) | Admitting: Occupational Therapy

## 2019-08-31 LAB — GLUCOSE, CAPILLARY
Glucose-Capillary: 94 mg/dL (ref 70–99)
Glucose-Capillary: 275 mg/dL — ABNORMAL HIGH (ref 70–99)
Glucose-Capillary: 123 mg/dL — ABNORMAL HIGH (ref 70–99)
Glucose-Capillary: 122 mg/dL — ABNORMAL HIGH (ref 70–99)
Glucose-Capillary: 117 mg/dL — ABNORMAL HIGH (ref 70–99)

## 2019-08-31 MED ORDER — GLIMEPIRIDE 2 MG PO TABS: 1.0000 mg | ORAL_TABLET | Freq: Every day | ORAL | Status: DC

## 2019-08-31 NOTE — Progress Notes (Signed)
Asked by primary nurse to see patients daughter who had some concerns to express.  Daughter was reportedly swearing and visibly upset.  Upon entering and introducing myself, daughter seemed to have calmed down.  She even said "I probably shouldn't have gotten upset, I probably overreacted earlier but it is so hard to see my dad this way."  She said her dad was very active before all of this playing golf, swimming, visiting others, etc.  She would like to speak with the MD regarding patient progress and expected outcomes.    Brita Romp, RN

## 2019-08-31 NOTE — Progress Notes (Addendum)
[facility] Surgery Consult Note  ARLON TINKEY August 14, 1940  PO:[id number].    Requesting MD: Alysia Penna Chief Complaint: CVA with severe dysphagia Reason for Consult: Gastrostomy tube placement.  HPI:  Patient is an 79 year old male admitted as a code stroke after he was found down by his son around 42 PM on 08/09/2019.  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  He was treated and eventually transferred to [facility] physical medicine and rehab center on 08/19/2019.  He was admitted with dysarthria, cognitive impairments and dysphagia.  He was n.p.o. on admission and started on speech therapy along with his other therapies.  Despite daily therapy the patient requires ongoing tube feedings.  Currently he is not progressing significantly and there is concern he will require a skilled nursing facility.  Because of his ongoing dysphagia they are recommending a PEG tube. CT scan obtained on 08/31/2019 she has a moderate size hiatal hernia feeding tube extends into the mid level of the distal stomach.  Below the diaphragm and stomach is shielded almost entirely by the left lobe of the liver and the splenic flexure of the colon also abuts the left lobe of the liver.  The anatomy was unfavorable to percutaneous gastrostomy placement. Because the patient does not appear to be a candidate for IR gastrostomy tube we are asked to see for gastrostomy tube placement.  He is currently on Plavix and had his last dose at 8:28 AM this morning.   ROS: Review of Systems  Unable to perform ROS: Mental acuity    Family History  Problem Relation Age of Onset  . Heart attack Mother   . Diabetes Mother   . Heart attack Brother   . Heart attack Maternal Grandmother   . Hypertension Maternal Grandmother   . Pancreatic cancer Brother   . Kidney disease Brother   . Hypertension Son   . Kidney disease Son        ?  Marland Kitchen Alzheimer's disease Father   . Diabetes Brother   . Diabetes Sister     Past Medical  History:  Diagnosis Date  . ALLERGIC RHINITIS   . Benign prostatic hypertrophy   . 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 Last nuc 2012 negative. 07/21/2013  . Carpal tunnel syndrome, bilateral   . Cataracts, bilateral   . Coronary artery disease    Dr. Claiborne Billings; 2D ECHO, 12/11/2011 - EF 50-55%, normal; NUCLEAR STRESS TEST, 10/16/2010 - no evidemce of inducible ischemia  . Diabetes mellitus type II   . Diverticulosis of colon   . GERD (gastroesophageal reflux disease)   . H/O hiatal hernia   . Head mass   . Hepatitis   . Hx of   sessile serrated colonic polyp 02/27/2015  . Hyperlipidemia   . Hypertension   . Inguinal hernia, Rt reduced, Lt present 07/21/2013  . Low back pain   . Mitral valve prolapse 11/24/2013   By echo dec 2014  . Nephrolithiasis   . Obesity, morbid ([place])   . Occlusion and stenosis of carotid artery without mention of cerebral infarction    CAROTID DOPPLER, 03/18/2012 - srable, mild, hard, plaque noted, bilaterally  . Stroke [facility])     Past Surgical History:  Procedure Laterality Date  . APPENDECTOMY    . CARDIAC CATHETERIZATION    . CATARACT EXTRACTION Right   . CORONARY ARTERY BYPASS GRAFT    . INGUINAL HERNIA REPAIR Right 07/26/2013   Procedure: REPAIR  INCARCERATED  RIGHT INGUINAL  HERNIA ;  Surgeon: Gwenyth Ober, MD;  Location: [place];  Service: General;  Laterality: Right;    Social History:  reports that he quit smoking about 29 years ago. His smoking use included cigarettes. He has never used smokeless tobacco. He reports that he does not drink alcohol or use drugs.  Allergies: No Known Allergies  Medications Prior to Admission  Medication Sig Dispense Refill  . Amino Acids-Protein Hydrolys (FEEDING SUPPLEMENT, PRO-STAT SUGAR FREE 64,) LIQD Place 30 mLs into feeding tube daily.    Marland Kitchen aspirin 81 MG chewable tablet Place 1 tablet (81 mg total) into feeding tube daily.    Marland Kitchen atorvastatin (LIPITOR) 40 MG tablet Place 1 tablet (40 mg total) into  feeding tube daily.    . clopidogrel (PLAVIX) 75 MG tablet Place 1 tablet (75 mg total) into feeding tube daily.    . diclofenac sodium (VOLTAREN) 1 % GEL Apply 2 g topically every 6 (six) hours as needed (for arthritis). (Patient not taking: Reported on 08/10/2019) 100 g 3  . levETIRAcetam (KEPPRA) 750 MG tablet Take 1 tablet (750 mg total) by mouth 2 (two) times daily. Per tube    . metoprolol tartrate (LOPRESSOR) 50 MG tablet Place 1 tablet (50 mg total) into feeding tube 2 (two) times daily.    . Nutritional Supplements (FEEDING SUPPLEMENT, JEVITY 1.2 CAL,) LIQD Place 1,000 mLs into feeding tube continuous.    . polyethylene glycol (MIRALAX / GLYCOLAX) 17 g packet Place 17 g into feeding tube daily as needed for moderate constipation.    . tamsulosin (FLOMAX) 0.4 MG CAPS capsule Take 1 capsule (0.4 mg total) by mouth daily. Per tube     Current medications: Aspirin 81 mg daily, Lipitor, Plavix 75 mg daily, Lovenox, tube feeding, Keppra, Ritalin  Blood pressure (!) 141/97, pulse 97, temperature 98 F (36.7 C), temperature source Oral, resp. rate 20, weight 94.1 kg, SpO2 99 %. Physical Exam: Physical Exam Constitutional:      General: He is not in acute distress.    Appearance: He is ill-appearing. He is not toxic-appearing or diaphoretic.     Comments: Patient is an 79 year old male status post right MCA stroke.  He has a fixed right-sided gaze.  He is unresponsive to commands.  He was aphasic and did not respond verbally at all while I was in the room with him.  He has ongoing left-sided hemiparesis.  He is having tremor-like movements in the right lower extremity along with some actual voluntary movements intermittently.  HENT:     Head: Normocephalic and atraumatic.     Mouth/Throat:     Pharynx: Oropharynx is clear.  Eyes:     General: No scleral icterus.    Conjunctiva/sclera: Conjunctivae normal.     Comments: He has a right sided fixed gaze.  Pupils were equal.  Neck:      Vascular: No carotid bruit.  Cardiovascular:     Rate and Rhythm: Normal rate and regular rhythm.     Heart sounds: No murmur.  Pulmonary:     Effort: Pulmonary effort is normal.     Breath sounds: Normal breath sounds.     Comments: Prior sternotomy scar well-healed. Abdominal:     Comments: Large abdomen nontender positive bowel sounds no hepatosplenomegaly.  He has a right lower quadrant surgical scar that appears to be from a prior appendectomy.  Genitourinary:    Comments: He is independent garments and is incontinent. Musculoskeletal:  Cervical back: Normal range of motion and neck supple. No rigidity or tenderness.     Comments: Well-healed right lower extremity venectomy scar.  No edema.  Almost no motion on the left side.  He has has tremors and then what looks like some kicking motion right lower extremity.  Lymphadenopathy:     Cervical: No cervical adenopathy.  Skin:    General: Skin is warm and dry.     Capillary Refill: Capillary refill takes 2 to 3 seconds.  Neurological:     Comments: Patient is aphasic with a fixed right-sided gaze.  He has a left hemiparesis, he is having some tremors and some kicking motion right lower extremity.  He does not follow commands or attempt to track at all.  No verbal response at all during the time I was there.  Psychiatric:     Comments: Patient was completely unresponsive while I was in the room.     Results for orders placed or performed during the hospital encounter of 08/19/19 (from the past 48 hour(s))  Glucose, capillary     Status: Abnormal   Collection Time: 08/29/19  4:36 PM  Result Value Ref Range   Glucose-Capillary 200 (H) 70 - 99 mg/dL  Glucose, capillary     Status: Abnormal   Collection Time: 08/29/19  8:03 PM  Result Value Ref Range   Glucose-Capillary 162 (H) 70 - 99 mg/dL  Glucose, capillary     Status: Abnormal   Collection Time: 08/30/19 12:00 AM  Result Value Ref Range   Glucose-Capillary 148 (H) 70 - 99  mg/dL  Glucose, capillary     Status: Abnormal   Collection Time: 08/30/19  4:09 AM  Result Value Ref Range   Glucose-Capillary 110 (H) 70 - 99 mg/dL  Basic metabolic panel     Status: Abnormal   Collection Time: 08/30/19  5:41 AM  Result Value Ref Range   Sodium 145 135 - 145 mmol/L   Potassium 4.2 3.5 - 5.1 mmol/L   Chloride 101 98 - 111 mmol/L   CO2 30 22 - 32 mmol/L   Glucose, Bld 152 (H) 70 - 99 mg/dL   BUN 51 (H) 8 - 23 mg/dL   Creatinine, Ser 1.77 (H) 0.61 - 1.24 mg/dL   Calcium 10.3 8.9 - 10.3 mg/dL   GFR calc non Af Amer 35 (L) >60 mL/min   GFR calc Af Amer 41 (L) >60 mL/min   Anion gap 14 5 - 15    Comment: Performed at [facility], [address], [place], [place] [address]  Glucose, capillary     Status: Abnormal   Collection Time: 08/30/19  7:25 AM  Result Value Ref Range   Glucose-Capillary 121 (H) 70 - 99 mg/dL  Glucose, capillary     Status: Abnormal   Collection Time: 08/30/19 12:21 PM  Result Value Ref Range   Glucose-Capillary 221 (H) 70 - 99 mg/dL  Glucose, capillary     Status: Abnormal   Collection Time: 08/30/19  4:25 PM  Result Value Ref Range   Glucose-Capillary 262 (H) 70 - 99 mg/dL  Glucose, capillary     Status: Abnormal   Collection Time: 08/30/19  8:44 PM  Result Value Ref Range   Glucose-Capillary 123 (H) 70 - 99 mg/dL  Glucose, capillary     Status: Abnormal   Collection Time: 08/30/19 11:42 PM  Result Value Ref Range   Glucose-Capillary 207 (H) 70 - 99 mg/dL   Comment 1 Notify  RN   Glucose, capillary     Status: None   Collection Time: 08/31/19  4:25 AM  Result Value Ref Range   Glucose-Capillary 94 70 - 99 mg/dL  Glucose, capillary     Status: Abnormal   Collection Time: 08/31/19  8:09 AM  Result Value Ref Range   Glucose-Capillary 275 (H) 70 - 99 mg/dL  Glucose, capillary     Status: Abnormal   Collection Time: 08/31/19 11:44 AM  Result Value Ref Range   Glucose-Capillary 123 (H) 70 - 99 mg/dL   CT ABDOMEN WO  CONTRAST  Result Date: 08/31/2019 CLINICAL DATA:  Cerebral infarction and assessment for potential gastrostomy tube placement. EXAM: CT ABDOMEN WITHOUT CONTRAST TECHNIQUE: Multidetector CT imaging of the abdomen was performed following the standard protocol without IV contrast. COMPARISON:  CT of the abdomen and pelvis on 07/21/2013 FINDINGS: Lower chest: Mild ground-glass opacity in the posterior left lower lobe and medial right lower lobe may be related to atelectasis or subtle infiltrate. Early pneumonia/atypical infection is not excluded. Hepatobiliary: No focal liver abnormality is seen. No gallstones, gallbladder wall thickening, or biliary dilatation. Pancreas: Unremarkable. No pancreatic ductal dilatation or surrounding inflammatory changes. Spleen: Normal in size without focal abnormality. Adrenals/Urinary Tract: Adrenal glands are unremarkable. Kidneys are normal, without renal calculi, focal lesion, or hydronephrosis. Stomach/Bowel: Moderate-sized hiatal hernia. Feeding tube extends to the level of the mid to distal stomach. Below the diaphragm, the rest of the stomach is shielded almost entirely by the left lobe of the liver and the splenic flexure of the colon also abuts the left lobe liver tip. This anatomy is highly unfavorable to gastrostomy tube placement percutaneously. The rest of the visualized abdominal bowel loops demonstrate no evidence of obstruction or ileus. No free air is identified. No inflammatory process is seen. Vascular/Lymphatic: No significant vascular findings are present. No enlarged abdominal lymph nodes. Other: No ascites, anasarca or focal fluid collections. Musculoskeletal: No acute or significant osseous findings. IMPRESSION: 1. Unfavorable anatomy for percutaneous gastrostomy tube placement. There is a moderate-sized hiatal hernia and the rest of the stomach is shielded by the left lobe of the liver. There also is encroachment of the splenic flexure of the colon abutting  the tip of the left lobe of the liver. If the patient is in need for long-term enteral nutrition, consider surgical consultation for either surgical gastrostomy or jejunostomy. 2. Mild ground-glass opacity in both lower lobes at the visualized lung bases. Although this may be on the basis of atelectasis, subtle infection including atypical viral infection is not excluded. Electronically Signed   By: Aletta Edouard M.D.   On: 08/31/2019 13:11    Currently on bolus tube feedings; all medications are per rectum or per tube.   Assessment/Plan: Acute right MCA infarction Left hemiplegia, dysarthria, and dysphagia Seizure disorder Aphasic, not following commands CAD s/p CABG Hypertension Diet-controlled type 2 diabetes CKD BPH BMI 32.5  FEN: N.p.o./tube feedings ID: None DVT: ASA 81 mg daily, Plavix 75 mg daily, Lovenox 40 mg daily  Plan: Dr. Grandville Silos has reviewed the CT scan.  Some of the stomach is in the chest. A large portion of the stomach is behind the liver and splenic flexure of the colon.  Patient has been placed on hold will discuss with the family tomorrow, and aim for gastrostomy tube placement on Friday, 09/02/2019.   Family contacts are listed below. [facility] [phone] Son [phone]  [phone]     [facility]  [facility] 08/31/2019, 2:36 PM Please see Amion for pager number during day hours 7:00am-4:30pm

## 2019-08-31 NOTE — Telephone Encounter (Signed)
FYI-please see message below.  

## 2019-08-31 NOTE — Progress Notes (Addendum)
[place] PHYSICAL MEDICINE & REHABILITATION PROGRESS NOTE  Subjective/Complaints:  Gown and diaper wet.  No vocalization PA noting family now OK with PEG   ROS: Limited due to cognitive/behavioral     Objective: Vital Signs: Blood pressure (!) 141/97, pulse 97, temperature 98 F (36.7 C), temperature source Oral, resp. rate 20, weight 94.1 kg, SpO2 99 %. No results found. No results for input(s): WBC, HGB, HCT, PLT in the last 72 hours. Recent Labs    08/29/19 0700 08/30/19 0541  NA 141 145  K 5.8* 4.2  CL 100 101  CO2 25 30  GLUCOSE 142* 152*  BUN 49* 51*  CREATININE 1.64* 1.77*  CALCIUM 10.2 10.3    Physical Exam: BP (!) 141/97 (BP Location: Right Leg)   Pulse 97   Temp 98 F (36.7 C) (Oral)   Resp 20   Wt 94.1 kg   SpO2 99%   BMI 32.49 kg/m  Constitutional: No distress . Vital signs reviewed. HEENT: EOMI, oral membranes moist Neck: supple Cardiovascular: RRR without murmur. No JVD    Respiratory: CTA Bilaterally without wheezes or rales. Normal effort    GI: BS +, non-tender, non-distended   GU: ongoing scrotal edema, penile shaft not visualized, able to see meatus   Skin: Warm and dry.  Intact. Psych: slow to arouse, arouses to verbal and tactile stim  Musc: Lower extremity edema. Neurological:  Alert makes eye contact, distracted Dysarthria/anarthric Motor: 3- Left shouder , hip flexor and knee ext, (spont movement not to command )--no changes Sensory no withdrawal to pinch  Severe Left neglect without change  Assessment/Plan: 1. Functional deficits secondary to right MCA infarct which require 3+ hours per day of interdisciplinary therapy in a comprehensive inpatient rehab setting.  Physiatrist is providing close team supervision and 24 hour management of active medical problems listed below.  Physiatrist and rehab team continue to assess barriers to discharge/monitor patient progress toward functional and medical goals  Care Tool:  Bathing         Body parts bathed by helper: (whole body)     Bathing assist Assist Level: 2 Helpers     Upper Body Dressing/Undressing Upper body dressing   What is the patient wearing?: Hospital gown only    Upper body assist Assist Level: Dependent - Patient 0%    Lower Body Dressing/Undressing Lower body dressing      What is the patient wearing?: Incontinence brief     Lower body assist Assist for lower body dressing: 2 Helpers     Toileting Toileting    Toileting assist Assist for toileting: Maximal Assistance - Patient 25 - 49%     Transfers Chair/bed transfer  Transfers assist     Chair/bed transfer assist level: Dependent - mechanical lift     Locomotion Ambulation   Ambulation assist   Ambulation activity did not occur: Safety/medical concerns          Walk 10 feet activity   Assist  Walk 10 feet activity did not occur: Safety/medical concerns        Walk 50 feet activity   Assist Walk 50 feet with 2 turns activity did not occur: Safety/medical concerns         Walk 150 feet activity   Assist Walk 150 feet activity did not occur: Safety/medical concerns         Walk 10 feet on uneven surface  activity   Assist Walk 10 feet on uneven surfaces activity did not occur: Safety/medical  concerns         Wheelchair     Assist   Type of Wheelchair: Manual    Wheelchair assist level: Dependent - Patient 0% Max wheelchair distance: 150    Wheelchair 50 feet with 2 turns activity    Assist        Assist Level: Dependent - Patient 0%   Wheelchair 150 feet activity     Assist     Assist Level: Dependent - Patient 0%      Medical Problem List and Plan: 1.  Left side weakness with dysarthria and dysphagia secondary to acute right MCA infarction  Continue CIR PT,  OT ,SLP-limited goals Mod A, may need SNF Team conference today please see physician documentation under team conference tab, met with team  to  discuss problems,progress, and goals. Formulized individual treatment plan based on medical history, underlying problem and comorbidities.  Prognosis guarded due to size of R MCA infarct, basically entire MCA territory   2.  Antithrombotics: -DVT/anticoagulation: Lovenox             -antiplatelet therapy: Aspirin 81 mg daily, Plavix 75 mg daily x3 months then Plavix alone 3. Pain Management: Voltaren gel as needed 4. Mood: Provide emotional support             -antipsychotic agents: N/A 5. Neuropsych: This patient is not capable of making decisions on his own behalf. 6. Skin/Wound Care: Routine skin checks 7. Fluids/Electrolytes/Nutrition: Routine in and outs.    Increase free H20, if GFR cont to fall may need renal consult Hyper K improved after Kayexalate Order IR for PEG 8.  Post stroke dysphagia.        N.p.o.             Nasogastric tube feeds.               Advance diet when more alert/able to tolerate 9.  Seizure disorder.  after discussion with daughter , reduced  Keppra  562m BID to reduce lethargy no seizures  EEG negative  No seizures since admission to rehab 10.  Essential hypertension.  Lopressor 25 mg twice daily   Vitals:   08/30/19 2049 08/31/19 0436  BP: (!) 153/96 (!) 141/97  Pulse: 89 97  Resp: 20 20  Temp: 98 F (36.7 C) 98 F (36.7 C)  SpO2: 99% 99%  Hold BB for HR < 60   1/24  bp remains elevated over last 24+ hours   increase lopressor to 37.531mbid on 1/24  Improving 1/26 11.  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  Hemoglobin A1c 6.2. CBG (last 3)  Recent Labs    08/30/19 2342 08/31/19 0425 08/31/19 0809  GLUCAP 207* 94 275*         labile use SSI 12.  CKD stage III.  - worsening likely volume  Related             recheck labs in AM 13.  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  Latest blood cultures question contaminant per infectious disease.  Ancef completed 08/19/2019.- afebrile              Repeat blood  culture no growth 14.  CAD with history of CABG 1997.  Continue aspirin and Plavix 15.  Hyperlipidemia: Continue Lipitor 16.  BPH.  Flomax 0.4 mg daily.               incont with low residuals  17.  Morbid obesity.  BMI  35.70.  Dietary follow-up 18.  Acute blood loss anemia Resolved   Hemoglobin 12.3 on 1/14, 14.7 on 1/18    19.  Lethargy may be sleep wake cycle related, unremarkable CBC, BMET, CXR  neg start ritalin trial   1/23 -increased ritalin to 27m-  discuss with team at conf - daughter has noticed some improvement discussed improvement of cerebral edema noted   Continue sleep chart   LOS: 12 days A FACE TO FACE EVALUATION WAS PERFORMED  ACharlett Blake1/27/2021, 8:17 AM

## 2019-08-31 NOTE — Progress Notes (Signed)
IR requested by Lauraine Rinne, PA-C for possible image-guided percutaneous gastrostomy tube placement.  CT abdomen 08/31/2019 reviewed by Dr. Kathlene Cote who states patient's anatomy is unfavorable for percutaneous gastrostomy tube placement (due to presence of hiatal hernia and stomach positioning). No IR interventions planned at this time. Recommend surgical consultation for possible gastrostomy tube placement. Will delete order. Lauraine Rinne, PA-C aware.  IR available in future if needed.   Bea Graff Tenise Stetler, PA-C 08/31/2019, 2:26 PM

## 2019-08-31 NOTE — Progress Notes (Signed)
Physical Therapy Session Note  Patient Details  Name: Andres [name] MRN: [id number] Date of Birth: 01-01-1941  Today's Date: 08/31/2019 PT Individual Time: 0800-0900 PT Individual Time Calculation (min): 60 min   Short Term Goals: Week 1:  PT Short Term Goal 1 (Week 1): Pt will sit EOB with max assist of 1 person PT Short Term Goal 1 - Progress (Week 1): Partly met PT Short Term Goal 2 (Week 1): Pt will transfer to [facility] with max A +2 and stedy PT Short Term Goal 2 - Progress (Week 1): Not met PT Short Term Goal 3 (Week 1): Pt will attend to L side 25% of the time during functional activity to improve safety awareness PT Short Term Goal 3 - Progress (Week 1): Not met PT Short Term Goal 4 (Week 1): Pt will tolerate sitting up in WC 3 hours between therapies. PT Short Term Goal 4 - Progress (Week 1): Not met Week 2:  PT Short Term Goal 1 (Week 2): Pt will transfer to WC in stedy PT Short Term Goal 2 (Week 2): Pt will tolerate sitting in WC >2 hours between therapies. PT Short Term Goal 3 (Week 2): Pt will perform sit<>stand with max assist of 1 PT Short Term Goal 4 (Week 2): Pt will maintain sitting balance with mod-max assist of 1 EOB up to 5 minutes Week 3:     Skilled Therapeutic Interventions/Progress Updates:    PAIN no indications of pain during session, pt nonverbal  Pt initially supine and awake.  Pt total assist to roll L/R and resisting movement strongly by pushing w/LUE and LLE , total assist for changing wet brief and gown.  Total assist to roll and come to sit, requires +2 assist due to very heavy pushing post L using R exts.   Worked on wt shifting to R in sitting by assisting pt to R lateral wt shifting to elbow, held up to 1 min and return to midline which he consistently overshoots but this did improve w/mult reptititions, RUE pushing persists. Attempted STS in Steady, but unable w/max to total assist of 2, pt not spontaneuously initiating w/mult efforts. Maxi move total  assust if 2 transfer from sitting on eob to wc, repositioned in wc for comfort/safety.  Pt transported to gym and worked on sitting balance/midline orientation via reaching ant/R w/clothespins to rack, mult efforts w/hand over hand assist to perform, pt engages in reaching inconsistently, but unable to operate clothespin likely due to apraxia, no improvement w/repeated efforst.  Pt falling asleep throughout this portion of session and requires constant stimulation to remain alert.  Pt transported to room and discussed session w/daughter.  Also discussed importance of pt being OOB for set abounts of time during day.  Pt then transported to nurses desk bc daughter was leaving for work.  Pt left at nurses station w/chair alarm set, nursing aware. Therapy Documentation Precautions:  Precautions Precautions: Fall Precaution Comments: Lt hemi Restrictions Weight Bearing Restrictions: No     Therapy/Group: Individual Therapy  Callie Fielding, [place] 08/31/2019, 2:05 PM

## 2019-08-31 NOTE — Telephone Encounter (Signed)
New message   Daughter [name] calling wants Dr. Jenny Reichmann to know her father in the hospital with recent CVA.

## 2019-08-31 NOTE — Progress Notes (Signed)
Speech Language Pathology Daily Session Note  Patient Details  Name: Andres [name] MRN: [id number] Date of Birth: 09-May-1941  Today's Date: 08/31/2019 SLP Individual Time: [id number] SLP Individual Time Calculation (min): 29 min  Short Term Goals: Week 2: SLP Short Term Goal 1 (Week 2): Pt will consume therapeutic trials of thin liquids, ice chips, or purees with max assist multimodal cues for use of swallowing precautions and minimal overt s/s of aspiratin over 3 consecutive sessions prior to instrumental assessment. SLP Short Term Goal 2 (Week 2): Pt will focus his attention to stimuli in >25% of opportunities with max assist multimodal cues. SLP Short Term Goal 3 (Week 2): Pt will remain alert for >1 minute with max assist multimodal cues. SLP Short Term Goal 4 (Week 2): Pt will orient to place, date, and situation with max assist multimodal cues. SLP Short Term Goal 5 (Week 2): Pt will visually scan to the left of midline in >25% of opportunities during basic structured tasks with max assist multimodal cues.  Skilled Therapeutic Interventions: Pt was seen for skilled ST targeting cognitive goals. Pt was able to maintain appropriate level of alertness throughout session with Min-Mod verbal/visual/and tactile stimulation. However, unable to follow commands to accept ice chip trials. Although unable to follow commands to open oral cavity for oral care, SLP did provided Total A and manipulation of mandible to perform oral care with suction swab. Pt more responsive to verbal and tactile cues to achieve more frequent instances of focused attention throughout session, however would only sustain eye contact with therapist for 5 seconds or less despite Max A multimodal cues. Pt unable to answer yes/no biographical questions and verbalizations were limited to "huh" and "ma'am". Pt left laying in bed with alarm set and needs within reach. Continue per current plan of care.       Pain Pain  Assessment Pain Scale: Faces Faces Pain Scale: No hurt  Therapy/Group: Individual Therapy  Arbutus Leas 08/31/2019, 3:04 PM

## 2019-08-31 NOTE — Plan of Care (Signed)
  Problem: RH Swallowing Goal: LTG Patient will consume least restrictive diet using compensatory strategies with assistance (SLP) Description: LTG:  Patient will consume least restrictive diet using compensatory strategies with assistance (SLP) Flowsheets (Taken 08/31/2019 1026) LTG: Pt Patient will consume least restrictive diet using compensatory strategies with assistance of (SLP): Total Assistance - Patient < 25% Goal: LTG Patient will participate in dysphagia therapy to increase swallow function with assistance (SLP) Description: LTG:  Patient will participate in dysphagia therapy to increase swallow function with assistance (SLP) Flowsheets (Taken 08/31/2019 1026) LTG: Pt will participate in dysphagia therapy to increase swallow function with assistance of (SLP): Total Assistance - Patient < 25%   Problem: RH Expression Communication Goal: LTG Patient will increase speech intelligibility (SLP) Description: LTG: Patient will increase speech intelligibility at word/phrase/conversation level with cues, % of the time (SLP) Flowsheets (Taken 08/31/2019 1026) LTG: Patient will increase speech intelligibility (SLP): Maximal Assistance - Patient 25 - 49%   Problem: RH Problem Solving Goal: LTG Patient will demonstrate problem solving for (SLP) Description: LTG:  Patient will demonstrate problem solving for basic/complex daily situations with cues  (SLP) Flowsheets (Taken 08/31/2019 1026) LTG: Patient will demonstrate problem solving for (SLP): Basic daily situations LTG Patient will demonstrate problem solving for: Total Assistance - Patient < 25%   Problem: RH Attention Goal: LTG Patient will demonstrate this level of attention during functional activites (SLP) Description: LTG:  Patient will will demonstrate this level of attention during functional activites (SLP) Flowsheets (Taken 08/31/2019 1026) LTG: Patient will demonstrate this level of attention during cognitive/linguistic activities  with assistance of (SLP): Maximal Assistance - Patient 25 - 49%  Goals downgraded due to slower than anticipated progress

## 2019-08-31 NOTE — Patient Care Conference (Signed)
Inpatient RehabilitationTeam Conference and Plan of Care Update Date: 08/31/2019   Time: 10:00 AM   Patient Name: Andres [name]      Medical Record Number: [id number]  Date of Birth: 1941-03-16 Sex: Male         Room/Bed: 4W03C/4W03C-01 Payor Info: Payor: GENERIC MEDICARE ADVANTAGE / Plan: GENERIC MEDICARE ADVANTAGE / Product Type: *No Product type* /    Admit Date/Time:  08/19/2019  3:40 PM  Primary Diagnosis:  Right middle cerebral artery stroke [facility])  Patient Active Problem List   Diagnosis Date Noted  . Acute blood loss anemia   . Labile blood pressure   . Diabetes mellitus type 2 in obese ([place])   . 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 Seizures ([place])   . Stage 3 chronic kidney disease   . Dysphagia, post-stroke   . Coagulase negative Staphylococcus bacteremia 08/15/2019  . Acute ischemic stroke ([place]) 08/09/2019  . Transaminitis 08/09/2019  . History of seizures 08/09/2019  . Left hip pain 03/02/2019  . Syncope 09/05/2018  . Gout 07/10/2018  . Scrotal swelling 06/09/2018  . Urinary dribbling 06/03/2017  . Mass of scrotum 06/03/2017  . Injury of left hand 07/21/2015  . Hx of   sessile serrated colonic polyp 02/27/2015  . Chronic anticoagulation 12/22/2014  . Colon cancer screening 12/22/2014  . Morbid obesity ([place]) 09/29/2014  . Mitral valve prolapse 11/24/2013  . IT band syndrome 09/23/2013  . Hypotension, unspecified 07/21/2013  . Dyspnea 07/21/2013  . Elevated troponin I level- (pk Troponin 0.34 in setting of acute renal insufficency) 07/21/2013  . 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 Last nuc 2012 negative. 07/21/2013  . SBO (small bowel obstruction) ([place]) 07/21/2013  . Inguinal hernia, Rt reduced, Lt present 07/21/2013  . Acute renal failure ([place]) 07/21/2013  . Pneumonia, aspiration ([place]) 07/21/2013  . Obesity (BMI 30-39.9) 01/17/2013  . Osteoarthritis, hand, primary localized 08/12/2012  . Preventative health care 04/11/2011   . MILD COGNITIVE IMPAIRMENT SO STATED 10/03/2010  . CEREBROVASCULAR ACCIDENT, HX OF 10/03/2010  . RASH-NONVESICULAR 05/12/2008  . TRANSIENT ISCHEMIC ATTACK 01/27/2008  . Diabetes ([place]) 12/08/2007  . ALLERGIC RHINITIS 12/08/2007  . DIVERTICULOSIS, COLON 12/08/2007  . LOW BACK PAIN 12/08/2007  . NEPHROLITHIASIS, HX OF 12/08/2007  . BPH (benign prostatic hyperplasia) 04/05/2007  . Hyperlipidemia 04/02/2007  . CARPAL TUNNEL SYNDROME, BILATERAL 04/02/2007  . CATARACT NOS 04/02/2007  . Essential hypertension 04/02/2007    Expected Discharge Date: Expected Discharge Date: (Anticipate SNF placement in 1 week.)  Team Members Present: Physician leading conference: Dr. Alysia Penna Social Worker Present: Lennart Pall, LCSW Nurse Present: Judee Clara, LPN Case Manager: Karene Fry, RN PT Present: Leavy Cella, PT;Rosita Dechalus, PTA OT Present: Darleen Crocker, OT SLP Present: Jettie Booze, CF-SLP PPS Coordinator present : Ileana Ladd, Burna Mortimer, SLP     Current Status/Progress Goal Weekly Team Focus  Bowel/Bladder   incontinent of bowel and bladder; LBM 1/26  reduce incontinent episodes  assess q shift and prn   Swallow/Nutrition/ Hydration   NPO, poor secretion management, little progress this week  Total A  PO trials when alert, oral acceptance of POs and oral care   ADL's   dependent - total +2 assistance depending on level of alertness and cognitive abilities  mod A  alertness, NMR, self care retraining, balance, functional transfers, cognition, family edu   Mobility   maxA x 2 bed mobilty, maxA x 2 STS in [place], maxA x 2 squat pivot  transfers, maxA x 2 gait 3-4 ft  mod assist bed mobility and transfers at [facility] level.  OOB tolerance, transfers, sitting balance, cognitive remediation   Communication   Max A verbal output and intelligibility  Max A  increasing verbal responses, intelligibility   Safety/Cognition/ Behavioral Observations  Max-Total A  Total A  focused  attention, arousal, basic self care tasks   Pain   no complaints of pain or signs of distress  pain less than 3  assess q shift and prn   Skin   MASD to groin and scrotum; swelling in scrotum  skin remain free of infection and further breakdown  assess q shift and prn    Rehab Goals Patient on target to meet rehab goals: No Rehab Goals Revised: Goals downgraded to moderate/maximal assist *See Care Plan and progress notes for long and short-term goals.     Barriers to Discharge  Current Status/Progress Possible Resolutions Date Resolved   Nursing                  PT                    OT                  SLP                SW Decreased caregiver support;Lack of/limited family support;Nutrition means Currently with a cortrak and tube feeds for nutrition; potential for PEG placement Daughter currently plans home with her however wants to see progression/level before making decision for other/SNF          Discharge Planning/Teaching Needs:  Home with daughter (in [place]) who will hire caregivers to assist when she is at work/potential for SNF placement  Care, transfers, medications and toileting, etc. Total care.   Team Discussion: A little more alert, monitoring BP, inc, movement on L side, needs a PEG.  RN did not sleep well, asleep today, inc, has coretrak, BM 1/26.  OT Dtr in the room, conversations about lack of progress, Dtr wants PEG and will need SNF at DC.  Self care +2, transfers +2, +2 slide board, not progressing.  PT Dtr in for fam ed, stood with steady, max/tot +2 bed, sitting balance and sit to stand max +2/tot A.  SLP more awake, not initiating, tot A overall.  Will get CT abd to place PEG, MD will hold plavix, PEG early next week, ready for SNF in about 1 week.   Revisions to Treatment Plan: N/A     Medical Summary Current Status: swallow function still poor.  Level of alertness slightly better on Ritalin, Labile BP Weekly Focus/Goal: IR referral for PEG  Barriers  to Discharge: Nutrition means;Decreased family/caregiver support   Possible Resolutions to Barriers: Family now in favor of PEG   Continued Need for Acute Rehabilitation Level of Care: The patient requires daily medical management by a physician with specialized training in physical medicine and rehabilitation for the following reasons: Direction of a multidisciplinary physical rehabilitation program to maximize functional independence : Yes Medical management of patient stability for increased activity during participation in an intensive rehabilitation regime.: Yes Analysis of laboratory values and/or radiology reports with any subsequent need for medication adjustment and/or medical intervention. : Yes   I attest that I was present, lead the team conference, and concur with the assessment and plan of the team.   Retta Diones 08/31/2019, 2:49 PM   Team conference was  held via web/ teleconference due to [place] - 19

## 2019-08-31 NOTE — Progress Notes (Signed)
Occupational Therapy Session Note  Patient Details  Name: Andres [name] MRN: [id number] Date of Birth: [birth year]  Today's Date: 08/31/2019 OT Individual Time: 1300-1325 OT Individual Time Calculation (min): 25 min  and Today's Date: 08/31/2019 OT Missed Time: 35 Minutes Missed Time Reason: Patient fatigue   Short Term Goals: Week 2:  OT Short Term Goal 1 (Week 2): Pt will maintain sustained attention to 1 self care task for ~10 seconds with min HOH assist for initiation OT Short Term Goal 2 (Week 2): Pt will complete 1 self care task w/c level at the sink to increase OOB tolerance OT Short Term Goal 3 (Week 2): Pt will complete toilet or BSC transfer with +2 assist and LRAD  Skilled Therapeutic Interventions/Progress Updates:    Upon entering the room, pt supine in bed and laying on L. Pt keeps eyes closed during session but is seen multiple times scratching dry skin on LEs. OT applying lotion to B LEs and pt following command to roll towards back for therapist to be able to apply to L LE as well. OT provided total A and placed bed into trendelenberg for positioning. Pt pushing heavily with R UE and then grasping bed rails and therapist unable to redirect pt to get him to release. Pt then falling asleep and snoring loudly. RN reports pt did not sleep well last night. OT unable to alert pt for participation or transfer at this time. Bed lowered for safety and bed alarm activated. Call bell within reach.   Therapy Documentation Precautions:  Precautions Precautions: Fall Precaution Comments: Lt hemi Restrictions Weight Bearing Restrictions: No General: General OT Amount of Missed Time: 35 Minutes ADL: ADL Eating: Not assessed Grooming: Dependent Where Assessed-Grooming: Bed level Upper Body Bathing: Dependent Where Assessed-Upper Body Bathing: Edge of bed Lower Body Bathing: Dependent Where Assessed-Lower Body Bathing: Bed level Upper Body Dressing: Dependent Where  Assessed-Upper Body Dressing: Edge of bed Lower Body Dressing: Dependent Where Assessed-Lower Body Dressing: Bed level Toileting: Not assessed Toilet Transfer: Not assessed Tub/Shower Transfer: Not assessed ADL Comments: Pt is dependent +2 assist during self care tasks excluding grooming (+1 assist)   Therapy/Group: Individual Therapy  Gypsy Decant 08/31/2019, 1:32 PM

## 2019-09-01 ENCOUNTER — Inpatient Hospital Stay (HOSPITAL_COMMUNITY): Payer: Medicare (Managed Care) | Admitting: Occupational Therapy

## 2019-09-01 ENCOUNTER — Inpatient Hospital Stay (HOSPITAL_COMMUNITY): Payer: Medicare (Managed Care) | Admitting: Physical Therapy

## 2019-09-01 ENCOUNTER — Inpatient Hospital Stay (HOSPITAL_COMMUNITY): Payer: Medicare (Managed Care) | Admitting: Speech Pathology

## 2019-09-01 LAB — BASIC METABOLIC PANEL
Anion gap: 13 (ref 5–15)
BUN: 62 mg/dL — ABNORMAL HIGH (ref 8–23)
CO2: 28 mmol/L (ref 22–32)
Calcium: 9.8 mg/dL (ref 8.9–10.3)
Chloride: 103 mmol/L (ref 98–111)
Creatinine, Ser: 1.67 mg/dL — ABNORMAL HIGH (ref 0.61–1.24)
GFR calc Af Amer: 44 mL/min — ABNORMAL LOW (ref >60)
GFR calc non Af Amer: 38 mL/min — ABNORMAL LOW (ref >60)
Glucose, Bld: 133 mg/dL — ABNORMAL HIGH (ref 70–99)
Potassium: 4.4 mmol/L (ref 3.5–5.1)
Sodium: 144 mmol/L (ref 135–145)

## 2019-09-01 LAB — GLUCOSE, CAPILLARY
Glucose-Capillary: 113 mg/dL — ABNORMAL HIGH (ref 70–99)
Glucose-Capillary: 154 mg/dL — ABNORMAL HIGH (ref 70–99)
Glucose-Capillary: 164 mg/dL — ABNORMAL HIGH (ref 70–99)
Glucose-Capillary: 196 mg/dL — ABNORMAL HIGH (ref 70–99)
Glucose-Capillary: 210 mg/dL — ABNORMAL HIGH (ref 70–99)
Glucose-Capillary: 242 mg/dL — ABNORMAL HIGH (ref 70–99)

## 2019-09-01 MED ORDER — METOPROLOL TARTRATE 50 MG PO TABS
50.0000 mg | ORAL_TABLET | Freq: Two times a day (BID) | ORAL | Status: DC
  Administered 2019-09-01 – 2019-09-09 (×15): 50 mg
  Filled 2019-09-01 (×15): qty 1

## 2019-09-01 NOTE — Progress Notes (Signed)
Daughter concerned of possible outcomes of patients and tube placement. As stated from doctor, surgeon will discuss plans for surgery. Social worker notified to speak with daughter. No further concerns noted at this time. Continue plan of care with patient, no s/s of distress noted.  Audie Clear, LPN

## 2019-09-01 NOTE — Progress Notes (Signed)
[place] PHYSICAL MEDICINE & REHABILITATION PROGRESS NOTE  Subjective/Complaints:  Appreciate IR and Gen Surgery notes , pt remains essentially non verbal but is alert this am   ROS: Limited due to cognitive/behavioral     Objective: Vital Signs: Blood pressure (!) 148/102, pulse 94, temperature 97.7 F (36.5 C), temperature source Axillary, resp. rate 18, weight 92.8 kg, SpO2 94 %. CT ABDOMEN WO CONTRAST  Result Date: 08/31/2019 CLINICAL DATA:  Cerebral infarction and assessment for potential gastrostomy tube placement. EXAM: CT ABDOMEN WITHOUT CONTRAST TECHNIQUE: Multidetector CT imaging of the abdomen was performed following the standard protocol without IV contrast. COMPARISON:  CT of the abdomen and pelvis on 07/21/2013 FINDINGS: Lower chest: Mild ground-glass opacity in the posterior left lower lobe and medial right lower lobe may be related to atelectasis or subtle infiltrate. Early pneumonia/atypical infection is not excluded. Hepatobiliary: No focal liver abnormality is seen. No gallstones, gallbladder wall thickening, or biliary dilatation. Pancreas: Unremarkable. No pancreatic ductal dilatation or surrounding inflammatory changes. Spleen: Normal in size without focal abnormality. Adrenals/Urinary Tract: Adrenal glands are unremarkable. Kidneys are normal, without renal calculi, focal lesion, or hydronephrosis. Stomach/Bowel: Moderate-sized hiatal hernia. Feeding tube extends to the level of the mid to distal stomach. Below the diaphragm, the rest of the stomach is shielded almost entirely by the left lobe of the liver and the splenic flexure of the colon also abuts the left lobe liver tip. This anatomy is highly unfavorable to gastrostomy tube placement percutaneously. The rest of the visualized abdominal bowel loops demonstrate no evidence of obstruction or ileus. No free air is identified. No inflammatory process is seen. Vascular/Lymphatic: No significant vascular findings are  present. No enlarged abdominal lymph nodes. Other: No ascites, anasarca or focal fluid collections. Musculoskeletal: No acute or significant osseous findings. IMPRESSION: 1. Unfavorable anatomy for percutaneous gastrostomy tube placement. There is a moderate-sized hiatal hernia and the rest of the stomach is shielded by the left lobe of the liver. There also is encroachment of the splenic flexure of the colon abutting the tip of the left lobe of the liver. If the patient is in need for long-term enteral nutrition, consider surgical consultation for either surgical gastrostomy or jejunostomy. 2. Mild ground-glass opacity in both lower lobes at the visualized lung bases. Although this may be on the basis of atelectasis, subtle infection including atypical viral infection is not excluded. Electronically Signed   By: Aletta Edouard M.D.   On: 08/31/2019 13:11   No results for input(s): WBC, HGB, HCT, PLT in the last 72 hours. Recent Labs    08/30/19 0541 09/01/19 0516  NA 145 144  K 4.2 4.4  CL 101 103  CO2 30 28  GLUCOSE 152* 133*  BUN 51* 62*  CREATININE 1.77* 1.67*  CALCIUM 10.3 9.8    Physical Exam: BP (!) 148/102 (BP Location: Left Arm)   Pulse 94   Temp 97.7 F (36.5 C) (Axillary)   Resp 18   Wt 92.8 kg   SpO2 94%   BMI 32.04 kg/m  Constitutional: No distress . Vital signs reviewed. HEENT: EOMI, oral membranes moist Neck: supple Cardiovascular: RRR without murmur. No JVD    Respiratory: CTA Bilaterally without wheezes or rales. Normal effort    GI: BS +, non-tender, non-distended    Skin: Warm and dry.  Intact. Psych: slow to arouse, arouses to verbal and tactile stim  Musc: Lower extremity edema. Neurological:  Alert makes eye contact, distracted Dysarthria/anarthric Motor: 3- Left shouder , hip flexor  and knee ext, (spont movement not to command )--no changes Sensory no withdrawal to pinch  Severe Left neglect without change  Assessment/Plan: 1. Functional deficits  secondary to right MCA infarct which require 3+ hours per day of interdisciplinary therapy in a comprehensive inpatient rehab setting.  Physiatrist is providing close team supervision and 24 hour management of active medical problems listed below.  Physiatrist and rehab team continue to assess barriers to discharge/monitor patient progress toward functional and medical goals  Care Tool:  Bathing        Body parts bathed by helper: (whole body)     Bathing assist Assist Level: 2 Helpers     Upper Body Dressing/Undressing Upper body dressing   What is the patient wearing?: Hospital gown only    Upper body assist Assist Level: Dependent - Patient 0%    Lower Body Dressing/Undressing Lower body dressing      What is the patient wearing?: Incontinence brief     Lower body assist Assist for lower body dressing: 2 Helpers     Toileting Toileting    Toileting assist Assist for toileting: Maximal Assistance - Patient 25 - 49%     Transfers Chair/bed transfer  Transfers assist     Chair/bed transfer assist level: Dependent - mechanical lift     Locomotion Ambulation   Ambulation assist   Ambulation activity did not occur: Safety/medical concerns          Walk 10 feet activity   Assist  Walk 10 feet activity did not occur: Safety/medical concerns        Walk 50 feet activity   Assist Walk 50 feet with 2 turns activity did not occur: Safety/medical concerns         Walk 150 feet activity   Assist Walk 150 feet activity did not occur: Safety/medical concerns         Walk 10 feet on uneven surface  activity   Assist Walk 10 feet on uneven surfaces activity did not occur: Safety/medical concerns         Wheelchair     Assist   Type of Wheelchair: Manual    Wheelchair assist level: Dependent - Patient 0% Max wheelchair distance: 150    Wheelchair 50 feet with 2 turns activity    Assist        Assist Level:  Dependent - Patient 0%   Wheelchair 150 feet activity     Assist     Assist Level: Dependent - Patient 0%      Medical Problem List and Plan: 1.  Left side weakness with dysarthria and dysphagia secondary to acute right MCA infarction  Continue CIR PT,  OT ,SLP   Plan is for Gastrostomy followed by SNF   Prognosis guarded due to size of R MCA infarct, basically entire MCA territory   2.  Antithrombotics: -DVT/anticoagulation: Lovenox             -antiplatelet therapy: Aspirin 81 mg daily, Plavix 75 mg daily x3 months then Plavix alone 3. Pain Management: Voltaren gel as needed 4. Mood: Provide emotional support             -antipsychotic agents: N/A 5. Neuropsych: This patient is not capable of making decisions on his own behalf. 6. Skin/Wound Care: Routine skin checks 7. Fluids/Electrolytes/Nutrition: Routine in and outs.    Increase free H20, if GFR cont to fall may need renal consult Hyper K improved after [place] Gen Surgery to discuss procedure with family  8.  Post stroke dysphagia.        N.p.o.             Nasogastric tube feeds.               Advance diet when more alert/able to tolerate 9.  Seizure disorder.  after discussion with daughter , reduced  Keppra  500mg  BID to reduce lethargy no seizures  EEG negative  No seizures since admission to rehab 10.  Essential hypertension.  Lopressor 25 mg twice daily   Vitals:   08/31/19 1955 09/01/19 0409  BP: (!) 198/100 (!) 148/102  Pulse: 98 94  Resp: 20 18  Temp: 97.8 F (36.6 C) 97.7 F (36.5 C)  SpO2: 92% 94%  elevated 1/27-1/28 will increase BB , may need to reduce ritalin dose  11.  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  Hemoglobin A1c 6.2. CBG (last 3)  Recent Labs    08/31/19 2023 08/31/19 2359 09/01/19 0358  GLUCAP 117* 242* 113*         labile use SSI 12.  CKD stage III.  - worsening likely volume  Related             recheck labs in AM 13.   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  Latest blood cultures question contaminant per infectious disease.  Ancef completed 08/19/2019.- afebrile              Repeat blood culture no growth 14.  CAD with history of CABG 1997.  Continue aspirin and Plavix 15.  Hyperlipidemia: Continue Lipitor 16.  BPH.  Flomax 0.4 mg daily.               incont with low residuals  17.  Morbid obesity.  BMI 35.70.  Dietary follow-up 18.  Acute blood loss anemia Resolved   Hemoglobin 12.3 on 1/14, 14.7 on 1/18    19.  Lethargy may be sleep wake cycle related, unremarkable CBC, BMET, CXR  neg start ritalin trial   1/23 -increased ritalin to 10mg -  discuss with team at conf - daughter has noticed some improvement discussed improvement of cerebral edema noted   Continue sleep chart   LOS: 13 days A FACE TO FACE EVALUATION WAS PERFORMED  [name] 09/01/2019, 7:54 AM

## 2019-09-01 NOTE — Progress Notes (Signed)
Speech Language Pathology Weekly Progress and Session Note  Patient Details  Name: Andres [name] MRN: [id number] Date of Birth: 1941-05-19  Beginning of progress report period: August 26, 2019 End of progress report period: September 01, 2019  Today's Date: 09/01/2019 SLP Individual Time: 2585-2778 SLP Individual Time Calculation (min): 28 min  Short Term Goals: Week 2: SLP Short Term Goal 1 (Week 2): Pt will consume therapeutic trials of thin liquids, ice chips, or purees with max assist multimodal cues for use of swallowing precautions and minimal overt s/s of aspiratin over 3 consecutive sessions prior to instrumental assessment. SLP Short Term Goal 1 - Progress (Week 2): Progressing toward goal SLP Short Term Goal 2 (Week 2): Pt will focus his attention to stimuli in >25% of opportunities with max assist multimodal cues. SLP Short Term Goal 2 - Progress (Week 2): Met SLP Short Term Goal 3 (Week 2): Pt will remain alert for >1 minute with max assist multimodal cues. SLP Short Term Goal 3 - Progress (Week 2): Met SLP Short Term Goal 4 (Week 2): Pt will orient to place, date, and situation with max assist multimodal cues. SLP Short Term Goal 4 - Progress (Week 2): Progressing toward goal SLP Short Term Goal 5 (Week 2): Pt will visually scan to the left of midline in >25% of opportunities during basic structured tasks with max assist multimodal cues. SLP Short Term Goal 5 - Progress (Week 2): Progressing toward goal    New Short Term Goals: Week 3: SLP Short Term Goal 1 (Week 3): Pt will consume therapeutic trials of thin liquids, ice chips, or purees with max assist multimodal cues and minimal overt s/s of aspiration over 3 consecutive sessions prior to instrumental assessment. SLP Short Term Goal 2 (Week 3): Pt will focus his attention to stimuli in >50% of opportunities with max assist multimodal cues. SLP Short Term Goal 3 (Week 3): Pt will remain alert for >3 minutes with max assist  multimodal cues. SLP Short Term Goal 4 (Week 3): Pt will orient to place, date, and situation with max assist multimodal cues. SLP Short Term Goal 5 (Week 3): Pt will visually scan to the left of midline in >25% of opportunities during basic structured tasks with max assist multimodal cues.  Weekly Progress Updates: Pt has made minimal functional gains, meeting 2 out of 5 short term goals. Pt is currently Max-Total assist due to severe cognitive impairments impacting arousal, focused and sustained attention, initiation, orientation, and verbal responses. Pt is NPO and awaiting PEG placement due to severe dysphagia and extremely limited ability to participate in PO trials at this time. Pt has demonstrated improved ability to maintain alertness for 1 minute intervals and focused attention. Pt and family education is ongoing. Pt would continue to benefit from skilled ST while inpatient in order to maximize functional independence and reduce burden of care prior to discharge. Anticipate that pt will need 24/7 supervision at discharge in addition to [place] follow up at next level of care - SNF recommended.      Intensity: Minumum of 1-2 x/day, 30 to 90 minutes Frequency: 3 to 5 out of 7 days Duration/Length of Stay: SNF placement once medically ready (anticipate 1 week) Treatment/Interventions: Cognitive remediation/compensation;Cueing hierarchy;Functional tasks;Patient/family education;Environmental controls;Dysphagia/aspiration precaution training;Internal/external aids   Daily Session  Skilled Therapeutic Interventions: Pt was seen for skilled ST targeting swallow and cognitive goals. Pt was alert upon arrival and achieved focused attention X1. After repositioning pt upright in bed and providing Total  A for oral care via suction toothbrush. Total A multimodal cues required for pt to accept small ice chip X1, however after very minimal lingual manipulation of bolus, pt held PO in mouth and began to close  eyes. Pt responded to clinician's cues to open eyes again and melted ice immediately suctioned out of oral cavity. No further PO trials were attempted. Although pt is improving in ability to maintain alertness, his cognitive impairments in attention and initiation are presenting severe aspiration risk and impacting his ability to participate in PO trials. Despite Max A cues, pt unable to demonstrate any carryover of orientation information presented throughout session. He did locate therapist at midline X1 and left visual field of environment X1 with Max A verbal and tactile cues. Max A verbal and visual cues required for pt to achieve focused attention in ~60% of opportunities. Pt verbalized his name, as well as "what" and "huh" throughout session. No other verbal responses to SLP's basic biographical/persoanlly relevant questions noted. Pt left laying in bed with alarm set and needs within reach. Continue per current plan of care.        Pain Pain Assessment Pain Scale: Faces Faces Pain Scale: No hurt  Therapy/Group: Individual Therapy  Arbutus Leas 09/01/2019, 7:22 AM

## 2019-09-01 NOTE — Progress Notes (Signed)
Occupational Therapy Session Note  Patient Details  Name: Andres [name] MRN: [id number] Date of Birth: October 18, 1940  Today's Date: 09/01/2019 OT Individual Time: [id number] OT Individual Time Calculation (min): 55 min    Short Term Goals: Week 2:  OT Short Term Goal 1 (Week 2): Pt will maintain sustained attention to 1 self care task for ~10 seconds with min HOH assist for initiation OT Short Term Goal 2 (Week 2): Pt will complete 1 self care task w/c level at the sink to increase OOB tolerance OT Short Term Goal 3 (Week 2): Pt will complete toilet or BSC transfer with +2 assist and LRAD  Skilled Therapeutic Interventions/Progress Updates:    Pt received at RN station in tilt in space wheelchair. OT assisting pt into day room and performed PROM to L UE. Pt does move shoulder but not in a functional way and not following commands with movement. Pt does not appear to be experiencing pain with PROM. OT applied L elbow protector this session. OT placing cloth into R hand to wash face but pt very resistive to movement. OT placing washcloth on pt's face but he does not attempt to remove on his own. Therapist guides hand towards cloth and he grabs and pulls off. Pt's vitals checked with BP of 153/117 and resting HR of 102 bpm. RN present and aware. Medications given. Pt returned to RN station for safety with lap tray donned and chair slightly tilted.   Therapy Documentation Precautions:  Precautions Precautions: Fall Precaution Comments: Lt hemi Restrictions Weight Bearing Restrictions: No    ADL: ADL Eating: Not assessed Grooming: Dependent Where Assessed-Grooming: Bed level Upper Body Bathing: Dependent Where Assessed-Upper Body Bathing: Edge of bed Lower Body Bathing: Dependent Where Assessed-Lower Body Bathing: Bed level Upper Body Dressing: Dependent Where Assessed-Upper Body Dressing: Edge of bed Lower Body Dressing: Dependent Where Assessed-Lower Body Dressing: Bed  level Toileting: Not assessed Toilet Transfer: Not assessed Tub/Shower Transfer: Not assessed ADL Comments: Pt is dependent +2 assist during self care tasks excluding grooming (+1 assist)   Therapy/Group: Individual Therapy  Gypsy Decant 09/01/2019, 12:19 PM

## 2019-09-01 NOTE — Progress Notes (Signed)
Physical Therapy Session Note  Patient Details  Name: Andres [name] MRN: [id number] Date of Birth: [birth year]  Today's Date: 09/01/2019 PT Individual Time: 0800-0900 and 1000-1054 PT Individual Time Calculation (min): 60 min and 54 min   Short Term Goals: Week 1:  PT Short Term Goal 1 (Week 1): Pt will sit EOB with max assist of 1 person PT Short Term Goal 1 - Progress (Week 1): Partly met PT Short Term Goal 2 (Week 1): Pt will transfer to [facility] with max A +2 and stedy PT Short Term Goal 2 - Progress (Week 1): Not met PT Short Term Goal 3 (Week 1): Pt will attend to L side 25% of the time during functional activity to improve safety awareness PT Short Term Goal 3 - Progress (Week 1): Not met PT Short Term Goal 4 (Week 1): Pt will tolerate sitting up in WC 3 hours between therapies. PT Short Term Goal 4 - Progress (Week 1): Not met Week 2:  PT Short Term Goal 1 (Week 2): Pt will transfer to WC in stedy PT Short Term Goal 2 (Week 2): Pt will tolerate sitting in WC >2 hours between therapies. PT Short Term Goal 3 (Week 2): Pt will perform sit<>stand with max assist of 1 PT Short Term Goal 4 (Week 2): Pt will maintain sitting balance with mod-max assist of 1 EOB up to 5 minutes  Skilled Therapeutic Interventions/Progress Updates:  Session 1  Pt received supine in bed and agreeable to PT. Rolling R and L with max assist of 1 to remove brief. Supine>sit transfer with total A+2 through log Roll on the R.   Sitting balance EOB with total A progressing to max assist of 1 x 5 minutes. Multimodal cues for UE placement, as well as midline orientation. Squat pivot transfer to [facility] with total A +2 to initiate movement as well blocking of the R knee to prevent LOB.   Pt transported to rehab gym in [facility]. Sit<>stand in parallel bars with max A of 1 on the first bout and + 2 for second and third bouts. Pt required total A to initiate movement and Support through trunk and RLE to prevent lateral LOB to the R.  Once in standing pt performed standing tolerance x 45 seconds on the first bout before returning to seat. Gait training perform forward x 33f  Forward and then 3 ft reverse with max assist for advancement of the RLE and blocking of the R knee to prevent LOB while moving LLE; rest break in sitting required between forward and reverse.   [facility] and all needs met.     sesison 2.   Pt received sitting in WC and agreeable to PT. [facility] seated in WC x 5 bouts to work on sitting balance, midline orientation, alertness, initiation, and visual scanning. Performed program A, 1 minute, lower R quadrant. Max assist +2 to prevent L lateral LOB. Max A to initiate movement and awareness of stimuli in with any movement to the L.   Pt returned to room and Attempted stand pivot transfer to the R, but unable to initiate transfer with increasing pusher response and poor set up, which PT unable to correct. Sit<>stand at rail with RUE supported x 1 with total A, but pt actively resisting and returned to seat. Performed squat pivot transfer to bed with L with total A +2 after  2 attempts; constant, heavy pushing the L required total A and max multimodal cues for adequate anterior weight shift to allow partial stand and +2 to come to sitting EOB.   Sit>supine completed with total A + 2 for BLE management and left supine in bed with call bell in reach and all needs met.   Pts daughter educated on recovery process with such on involved CVA with resultant, pushers syndrome, visual deficits, apraxia, lethargy, and L sided neglect. Daughter verbalized understanding, but still hopefull for improved function in the long term.         Therapy Documentation Precautions:  Precautions Precautions: Fall Precaution Comments: Lt hemi Restrictions Weight Bearing Restrictions: No Pain:   faces: none   Therapy/Group:  Individual Therapy  Lorie Phenix 09/01/2019, 12:58 PM

## 2019-09-01 NOTE — Progress Notes (Signed)
Subjective: CC: Patient opens eyes to questions is not able to answer.   Objective: Vital signs in last 24 hours: Temp:  [97.7 F (36.5 C)-97.8 F (36.6 C)] 97.7 F (36.5 C) (01/28 0409) Pulse Rate:  [88-98] 94 (01/28 0409) Resp:  [18-20] 18 (01/28 0409) BP: (148-198)/(100-107) 148/102 (01/28 0409) SpO2:  [92 %-96 %] 94 % (01/28 0409) Weight:  [92.8 kg] 92.8 kg (01/28 0409) Last BM Date: 08/30/19  Intake/Output from previous day: No intake/output data recorded. Intake/Output this shift: No intake/output data recorded.  PE: Gen: Awake, open eyes, does not make eye contact Heart: RRR Lungs: Clear b/l. Normal rate and effort Abd: Soft, protuberant, no rigidity, +BS  Lab Results:  No results for input(s): WBC, HGB, HCT, PLT in the last 72 hours. BMET Recent Labs    08/30/19 0541 09/01/19 0516  NA 145 144  K 4.2 4.4  CL 101 103  CO2 30 28  GLUCOSE 152* 133*  BUN 51* 62*  CREATININE 1.77* 1.67*  CALCIUM 10.3 9.8   PT/INR No results for input(s): LABPROT, INR in the last 72 hours. CMP     Component Value Date/Time   NA 144 09/01/2019 0516   K 4.4 09/01/2019 0516   CL 103 09/01/2019 0516   CO2 28 09/01/2019 0516   GLUCOSE 133 (H) 09/01/2019 0516   BUN 62 (H) 09/01/2019 0516   CREATININE 1.67 (H) 09/01/2019 0516   CREATININE 1.21 (H) 03/12/2015 0838   CALCIUM 9.8 09/01/2019 0516   PROT 6.6 08/22/2019 0606   ALBUMIN 3.2 (L) 08/22/2019 0606   AST 39 08/22/2019 0606   ALT 21 08/22/2019 0606   ALKPHOS 71 08/22/2019 0606   BILITOT 0.3 08/22/2019 0606   GFRNONAA 38 (L) 09/01/2019 0516   GFRAA 44 (L) 09/01/2019 0516   Lipase  No results found for: LIPASE     Studies/Results: CT ABDOMEN WO CONTRAST  Result Date: 08/31/2019 CLINICAL DATA:  Cerebral infarction and assessment for potential gastrostomy tube placement. EXAM: CT ABDOMEN WITHOUT CONTRAST TECHNIQUE: Multidetector CT imaging of the abdomen was performed following the standard protocol  without IV contrast. COMPARISON:  CT of the abdomen and pelvis on 07/21/2013 FINDINGS: Lower chest: Mild ground-glass opacity in the posterior left lower lobe and medial right lower lobe may be related to atelectasis or subtle infiltrate. Early pneumonia/atypical infection is not excluded. Hepatobiliary: No focal liver abnormality is seen. No gallstones, gallbladder wall thickening, or biliary dilatation. Pancreas: Unremarkable. No pancreatic ductal dilatation or surrounding inflammatory changes. Spleen: Normal in size without focal abnormality. Adrenals/Urinary Tract: Adrenal glands are unremarkable. Kidneys are normal, without renal calculi, focal lesion, or hydronephrosis. Stomach/Bowel: Moderate-sized hiatal hernia. Feeding tube extends to the level of the mid to distal stomach. Below the diaphragm, the rest of the stomach is shielded almost entirely by the left lobe of the liver and the splenic flexure of the colon also abuts the left lobe liver tip. This anatomy is highly unfavorable to gastrostomy tube placement percutaneously. The rest of the visualized abdominal bowel loops demonstrate no evidence of obstruction or ileus. No free air is identified. No inflammatory process is seen. Vascular/Lymphatic: No significant vascular findings are present. No enlarged abdominal lymph nodes. Other: No ascites, anasarca or focal fluid collections. Musculoskeletal: No acute or significant osseous findings. IMPRESSION: 1. Unfavorable anatomy for percutaneous gastrostomy tube placement. There is a moderate-sized hiatal hernia and the rest of the stomach is shielded by the left lobe of the liver. There also  is encroachment of the splenic flexure of the colon abutting the tip of the left lobe of the liver. If the patient is in need for long-term enteral nutrition, consider surgical consultation for either surgical gastrostomy or jejunostomy. 2. Mild ground-glass opacity in both lower lobes at the visualized lung bases.  Although this may be on the basis of atelectasis, subtle infection including atypical viral infection is not excluded. Electronically Signed   By: Aletta Edouard M.D.   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 Grandville Silos discussing with family to determine if they would like to proceed with open g tube possible j tube placement. Further updates to follow.   FEN: N.p.o./tube feedings ID: None DVT: ASA 81 mg daily, Lovenox 40 mg daily. Hold Plavix    LOS: 13 days    Jillyn Ledger , [facility] 09/01/2019, 10:11 AM Please see Amion for pager number during day hours 7:00am-4:30pm

## 2019-09-01 NOTE — Progress Notes (Signed)
Discussed with daughter, no agreement on surgery tomorrow so she wants continue tube feedings until further notice. No complications noted at this time. Continue plan of care. Audie Clear, LPN

## 2019-09-01 NOTE — Progress Notes (Signed)
Family concerned about bruising on R arm/hand. Informed daughter that it could be from medications or labs being drawn. Daughter would like to speak with MD about PEG tube placement. Pt has slept from 11pm-2am. Pt grabs at brief continuously throughout night and pulls it off. Pt unable to verbalize pain to enlarged scrotum but wonder if that is causing him to pull at brief. Unproductive coughing throughout night. Provided suction once with no sputum returned.

## 2019-09-02 ENCOUNTER — Inpatient Hospital Stay (HOSPITAL_COMMUNITY): Payer: Medicare (Managed Care) | Admitting: Physical Therapy

## 2019-09-02 ENCOUNTER — Inpatient Hospital Stay (HOSPITAL_COMMUNITY): Payer: Medicare (Managed Care) | Admitting: Speech Pathology

## 2019-09-02 ENCOUNTER — Inpatient Hospital Stay (HOSPITAL_COMMUNITY): Payer: Medicare (Managed Care) | Admitting: Occupational Therapy

## 2019-09-02 ENCOUNTER — Encounter (HOSPITAL_COMMUNITY): Payer: Self-pay | Admitting: Physical Medicine & Rehabilitation

## 2019-09-02 ENCOUNTER — Other Ambulatory Visit: Payer: Self-pay

## 2019-09-02 LAB — GLUCOSE, CAPILLARY
Glucose-Capillary: 111 mg/dL — ABNORMAL HIGH (ref 70–99)
Glucose-Capillary: 151 mg/dL — ABNORMAL HIGH (ref 70–99)
Glucose-Capillary: 167 mg/dL — ABNORMAL HIGH (ref 70–99)
Glucose-Capillary: 181 mg/dL — ABNORMAL HIGH (ref 70–99)
Glucose-Capillary: 197 mg/dL — ABNORMAL HIGH (ref 70–99)

## 2019-09-02 MED ORDER — OSMOLITE 1.5 CAL PO LIQD
237.0000 mL | Freq: Every day | ORAL | Status: DC
  Administered 2019-09-02 – 2019-09-05 (×15): 237 mL
  Filled 2019-09-02 (×8): qty 237

## 2019-09-02 MED ORDER — FREE WATER
200.0000 mL | Status: AC
  Administered 2019-09-02 – 2019-09-05 (×19): 200 mL

## 2019-09-02 MED ORDER — METHYLPHENIDATE HCL 5 MG PO TABS
5.0000 mg | ORAL_TABLET | Freq: Two times a day (BID) | ORAL | Status: DC
  Administered 2019-09-02 – 2019-09-05 (×6): 5 mg via ORAL
  Filled 2019-09-02 (×6): qty 1

## 2019-09-02 NOTE — Progress Notes (Signed)
Occupational Therapy Session Note  Patient Details  Name: Andres [name] MRN: [id number] Date of Birth: [birth year]  Today's Date: 09/02/2019 OT Individual Time: 1330-1414 OT Individual Time Calculation (min): 44 min   Skilled Therapeutic Interventions/Progress Updates:    Pt greeted in TIS at RN station, very lethargic with minimal eye opening. Escorted pt back to room and placed TIS in neutral position at sink. Tried to engage in pt in several grooming tasks with [facility] assist, noted B UE resistance 50% of the time R UE>L UE. Pt resistant to facilitation for anterior weight shifting as well. He did initiate weight shifting forward himself to place Rt hand in warm running water. OT positioned his Lt hand in the sink and pt moved his shoulder forward and back, placing affected hand in and out of water. When OT applied soap to Lt hand and facilitated HOH with Rt to wash Lt hand, pt became resistant and pushed himself back in w/c. +2 for weight shifting forward towards sink again with active resistance and pushing from pt. +2 for maxi transfer to bed and also for brief change due to incontinence. Total A of 1 for rolling Rt>Lt while 2nd helper completed hygiene and brief change. Pt left in bed with all needs within reach and bed alarm set. Tx focus placed on alertness, initiation, motor planning, functional cognition, and ADL retraining.   Therapy Documentation Precautions:  Precautions Precautions: Fall Precaution Comments: Lt hemi Restrictions Weight Bearing Restrictions: No Vital Signs: Therapy Vitals Temp: 98.1 F (36.7 C) Temp Source: Oral Pulse Rate: 84 Resp: 18 BP: (!) 144/103 Patient Position (if appropriate): Lying Oxygen Therapy SpO2: 93 % O2 Device: Room Air  Pain: No s/s pain during tx ADL: ADL Eating: Not assessed Grooming: Dependent Where Assessed-Grooming: Bed level Upper Body Bathing: Dependent Where Assessed-Upper Body Bathing: Edge of bed Lower Body Bathing:  Dependent Where Assessed-Lower Body Bathing: Bed level Upper Body Dressing: Dependent Where Assessed-Upper Body Dressing: Edge of bed Lower Body Dressing: Dependent Where Assessed-Lower Body Dressing: Bed level Toileting: Not assessed Toilet Transfer: Not assessed Tub/Shower Transfer: Not assessed ADL Comments: Pt is dependent +2 assist during self care tasks excluding grooming (+1 assist)      Therapy/Group: Individual Therapy  Eva Vallee A [place] Trawick 09/02/2019, 3:50 PM

## 2019-09-02 NOTE — Progress Notes (Signed)
Patient ID: Andres [name], male   DOB: September 10, 1940, 79 y.o.   MRN: [id number] His daughter called our office and reported they do not want [name] to undergo gastrostomy placement. Please call us back as needed.  Georganna Skeans, MD, MPH, FACS Please use [email or web address] to contact on call provider

## 2019-09-02 NOTE — Progress Notes (Signed)
Speech Language Pathology Daily Session Note  Patient Details  Name: Andres [name] MRN: [id number] Date of Birth: 23-Feb-1941  Today's Date: 09/02/2019 SLP Individual Time: 0817-0900 SLP Individual Time Calculation (min): 43 min  Short Term Goals: Week 3: SLP Short Term Goal 1 (Week 3): Pt will consume therapeutic trials of thin liquids, ice chips, or purees with max assist multimodal cues and minimal overt s/s of aspiration over 3 consecutive sessions prior to instrumental assessment. SLP Short Term Goal 2 (Week 3): Pt will focus his attention to stimuli in >50% of opportunities with max assist multimodal cues. SLP Short Term Goal 3 (Week 3): Pt will remain alert for >3 minutes with max assist multimodal cues. SLP Short Term Goal 4 (Week 3): Pt will orient to place, date, and situation with max assist multimodal cues. SLP Short Term Goal 5 (Week 3): Pt will visually scan to the left of midline in >25% of opportunities during basic structured tasks with max assist multimodal cues.  Skilled Therapeutic Interventions:  Skilled treatment session targeted cognition goals. Pt was incontinent of urin and SLP provided cognitive therapy during functional familiar activity of bed mobility. SLP attempted bed mobility with Total multimodal cues for pt to scan and locate bed rail and to follow 1 step command to roll. Pt unable to perform task. Pt required Total assistance +2 for bed mobility and despite having wash cloth in his hand he was pushing against SLP when attempting to wipe his face. With Total A pt unable to put arm through gown armhole despite pt looking at gown. Pt was dependent for all cognitive tasks this session. During oral care attempts, pt was turning his head away from toothbrush. Pt currently has bolus feeds stopped for possible PEG placement therefore ice chips were not tried during this session. Pt left uprigh tin bed, bed alarm on and all needs within reach.      Pain     Therapy/Group: Individual Therapy  [name] 09/02/2019, 8:21 AM

## 2019-09-02 NOTE — Progress Notes (Addendum)
[place] PHYSICAL MEDICINE & REHABILITATION PROGRESS NOTE  Subjective/Complaints:   Appreciate Gen Surgery note  Pt remains essentially non verbal  Incont bowel and bladder   Long phone conversation with patient's daughter  ROS: Limited due to cognitive/behavioral     Objective: Vital Signs: Blood pressure (!) 171/99, pulse 87, temperature 98.1 F (36.7 C), resp. rate 18, weight 91.1 kg, SpO2 94 %. CT ABDOMEN WO CONTRAST  Result Date: 08/31/2019 CLINICAL DATA:  Cerebral infarction and assessment for potential gastrostomy tube placement. EXAM: CT ABDOMEN WITHOUT CONTRAST TECHNIQUE: Multidetector CT imaging of the abdomen was performed following the standard protocol without IV contrast. COMPARISON:  CT of the abdomen and pelvis on 07/21/2013 FINDINGS: Lower chest: Mild ground-glass opacity in the posterior left lower lobe and medial right lower lobe may be related to atelectasis or subtle infiltrate. Early pneumonia/atypical infection is not excluded. Hepatobiliary: No focal liver abnormality is seen. No gallstones, gallbladder wall thickening, or biliary dilatation. Pancreas: Unremarkable. No pancreatic ductal dilatation or surrounding inflammatory changes. Spleen: Normal in size without focal abnormality. Adrenals/Urinary Tract: Adrenal glands are unremarkable. Kidneys are normal, without renal calculi, focal lesion, or hydronephrosis. Stomach/Bowel: Moderate-sized hiatal hernia. Feeding tube extends to the level of the mid to distal stomach. Below the diaphragm, the rest of the stomach is shielded almost entirely by the left lobe of the liver and the splenic flexure of the colon also abuts the left lobe liver tip. This anatomy is highly unfavorable to gastrostomy tube placement percutaneously. The rest of the visualized abdominal bowel loops demonstrate no evidence of obstruction or ileus. No free air is identified. No inflammatory process is seen. Vascular/Lymphatic: No significant vascular  findings are present. No enlarged abdominal lymph nodes. Other: No ascites, anasarca or focal fluid collections. Musculoskeletal: No acute or significant osseous findings. IMPRESSION: 1. Unfavorable anatomy for percutaneous gastrostomy tube placement. There is a moderate-sized hiatal hernia and the rest of the stomach is shielded by the left lobe of the liver. There also is encroachment of the splenic flexure of the colon abutting the tip of the left lobe of the liver. If the patient is in need for long-term enteral nutrition, consider surgical consultation for either surgical gastrostomy or jejunostomy. 2. Mild ground-glass opacity in both lower lobes at the visualized lung bases. Although this may be on the basis of atelectasis, subtle infection including atypical viral infection is not excluded. Electronically Signed   By: Aletta Edouard M.D.   On: 08/31/2019 13:11   No results for input(s): WBC, HGB, HCT, PLT in the last 72 hours. Recent Labs    09/01/19 0516  NA 144  K 4.4  CL 103  CO2 28  GLUCOSE 133*  BUN 62*  CREATININE 1.67*  CALCIUM 9.8    Physical Exam: BP (!) 171/99 (BP Location: Left Arm)   Pulse 87   Temp 98.1 F (36.7 C)   Resp 18   Wt 91.1 kg   SpO2 94%   BMI 31.46 kg/m  Constitutional: No distress . Vital signs reviewed. HEENT: EOMI, oral membranes moist Neck: supple Cardiovascular: RRR without murmur. No JVD    Respiratory: CTA Bilaterally without wheezes or rales. Normal effort    GI: BS +, non-tender, non-distended    Skin: Warm and dry.  Intact. Psych: slow to arouse, arouses to verbal and tactile stim  Musc: Lower extremity edema. Neurological:  Alert makes eye contact, distracted Dysarthria/anarthric Motor: 3- Left shouder , hip flexor and knee ext, (spont movement not to command )--  no changes Sensory no withdrawal to pinch  Severe Left neglect without change  Assessment/Plan: 1. Functional deficits secondary to right MCA infarct which require 3+  hours per day of interdisciplinary therapy in a comprehensive inpatient rehab setting.  Physiatrist is providing close team supervision and 24 hour management of active medical problems listed below.  Physiatrist and rehab team continue to assess barriers to discharge/monitor patient progress toward functional and medical goals  Care Tool:  Bathing        Body parts bathed by helper: (whole body)     Bathing assist Assist Level: 2 Helpers     Upper Body Dressing/Undressing Upper body dressing   What is the patient wearing?: Hospital gown only    Upper body assist Assist Level: Dependent - Patient 0%    Lower Body Dressing/Undressing Lower body dressing      What is the patient wearing?: Incontinence brief     Lower body assist Assist for lower body dressing: 2 Helpers     Toileting Toileting    Toileting assist Assist for toileting: Maximal Assistance - Patient 25 - 49%     Transfers Chair/bed transfer  Transfers assist     Chair/bed transfer assist level: Dependent - mechanical lift     Locomotion Ambulation   Ambulation assist   Ambulation activity did not occur: Safety/medical concerns          Walk 10 feet activity   Assist  Walk 10 feet activity did not occur: Safety/medical concerns        Walk 50 feet activity   Assist Walk 50 feet with 2 turns activity did not occur: Safety/medical concerns         Walk 150 feet activity   Assist Walk 150 feet activity did not occur: Safety/medical concerns         Walk 10 feet on uneven surface  activity   Assist Walk 10 feet on uneven surfaces activity did not occur: Safety/medical concerns         Wheelchair     Assist   Type of Wheelchair: Manual    Wheelchair assist level: Dependent - Patient 0% Max wheelchair distance: 150    Wheelchair 50 feet with 2 turns activity    Assist        Assist Level: Dependent - Patient 0%   Wheelchair 150 feet  activity     Assist     Assist Level: Dependent - Patient 0%      Medical Problem List and Plan: 1.  Left side weakness with dysarthria and dysphagia secondary to acute right MCA infarction  Continue CIR PT,  OT ,SLP   Plan is for Gastrostomy followed by SNF   Prognosis guarded due to size of R MCA infarct, basically entire MCA territory   2.  Antithrombotics: -DVT/anticoagulation: Lovenox             -antiplatelet therapy: Aspirin 81 mg daily, Plavix 75 mg daily x3 months then Plavix alone 3. Pain Management: Voltaren gel as needed 4. Mood: Provide emotional support             -antipsychotic agents: N/A 5. Neuropsych: This patient is not capable of making decisions on his own behalf. 6. Skin/Wound Care: Routine skin checks 7. Fluids/Electrolytes/Nutrition: Routine in and outs.   cognition and pharyngeal dysphagia necessitate NPO status - cont TF for now but will not be able to continue this out of the hospital  - will discuss further with daughter since she  does not wish to pursue open gastrostomy at this time (unable to do PEG due to anatomy)   Patient's daughter canceled the open gastrostomy for today.  She states she has some other emergency.  She would like to have the procedure done on Tuesday.  We discussed that given his recent stroke he would be at increased risk to have another stroke under anesthesia she understands this.  We discussed that if he did not have a gastrostomy placed, palliative care consult would be needed to establish goals of care. 8.  Post stroke dysphagia.        N.p.o.             Nasogastric tube feeds.               Advance diet when more alert/able to tolerate 9.  Seizure disorder.  after discussion with daughter , reduced  Keppra  500mg  BID to reduce lethargy no seizures  EEG negative  No seizures since admission to rehab 10.  Essential hypertension.  Lopressor 25 mg twice daily   Vitals:   09/02/19 0420 09/02/19 0431  BP: (!) 137/104 (!) 171/99   Pulse: 80 87  Resp: 18   Temp: 98.1 F (36.7 C)   SpO2: 94%   elevated 1/28-29  will increase BB , will reduce ritalin dose  11.  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  Hemoglobin A1c 6.2. CBG (last 3)  Recent Labs    09/01/19 2046 09/02/19 0005 09/02/19 0414  GLUCAP 196* 167* 111*         labile use SSI 12.  CKD stage III.  -Worsening of renal function, stable over the last week however elevated compared to baseline.  Free water was increased but this has not made a significant change in BUN/creatinine.  Will ask nephrology to evaluate 13.  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  Latest blood cultures question contaminant per infectious disease.  Ancef completed 08/19/2019.- afebrile              Repeat blood culture no growth 14.  CAD with history of CABG 1997.  Continue aspirin and Plavix 15.  Hyperlipidemia: Continue Lipitor 16.  BPH.  Flomax 0.4 mg daily.               incont with low residuals  17.  Morbid obesity.  BMI 35.70.  Dietary follow-up 18.  Acute blood loss anemia Resolved   Hemoglobin 12.3 on 1/14, 14.7 on 1/18    19.  Lethargy may be sleep wake cycle related, unremarkable CBC, BMET, CXR  neg start ritalin trial   Ritalin helpful for alertness 5mg =10mg , no improvement in function however, will reduce to 5 mg  Continue sleep chart   LOS: 14 days A FACE TO FACE EVALUATION WAS PERFORMED  [name] 09/02/2019, 7:57 AM

## 2019-09-02 NOTE — Progress Notes (Signed)
Received in report daughter is undecided on if surgery for peg tube would be on 01/29 or 01/30 therefore pt is NPO at midnight until daughter decides in AM which date for placement of peg tube. If surgery is postponed then bolus tube feedings via cor-trak would continue.

## 2019-09-02 NOTE — Progress Notes (Signed)
Nutrition Follow-up  DOCUMENTATION CODES:   Obesity unspecified  INTERVENTION:   Change bolus tube feeds via Cortrak: - 1 carton (237 ml) of Osmolite 1.5 cal formula 6 X daily - Pro-stat 30 ml BID - Free water 200 ml Q4 or per MD/PA  Bolus tube feeding regimen provides2330kcal, 119grams of protein, and 1571m of H2O.    NUTRITION DIAGNOSIS:   Inadequate oral intake related to inability to eat, lethargy/confusion as evidenced by NPO status.  Ongoing.  GOAL:   Patient will meet greater than or equal to 90% of their needs  Met via tube feeding.  MONITOR:   TF tolerance, Labs, Weight trends, I & O's  REASON FOR ASSESSMENT:   Consult Enteral/tube feeding initiation and management  ASSESSMENT:   79year old right-handed male with history of hypertension, CKD stage III, morbid obesity with BMI 35.70, hyperlipidemia, history of seizure maintained on Keppra, CAD status post CABG, history of CVA maintained on aspirin 81 mg daily and Plavix, carotid artery stenosis, mitral valve prolapse, diet-controlled diabetes mellitus, BPH and remote tobacco abuse. Admitted to rehab, s/p acute rt MCA infarct surroudning of R MCA parietal infarct, d/t right M1 occulusion.  Pt nonverbal.  Pt remains NPO with bolus tube feeds via gastric Cortrak.  Discussed pt with RN. RN reports pt tolerating TF without issue. Per RN, MD has asked for RD to increase free water flushes.   Pt to have open gastrostomy Tuesday per MD.   Pt with a 20 lb weight loss since admission to CIR. RD will adjust tube feeding and will continue to monitor trends.  Medications reviewed and include: Osmolite 1.5 cal 2370mQID, 3040mro-stat BID, 200m12mee water Q8, SSI  Labs reviewed: BUN 62, Cr 1.67 CBGs 111-167    Diet Order:   Diet Order    None      EDUCATION NEEDS:   No education needs have been identified at this time  Skin:  Skin Assessment: Reviewed RN Assessment  Last BM:  1/26  Height:    Ht Readings from Last 1 Encounters:  08/10/19 _0  (1.702 m)    Weight:   Wt Readings from Last 1 Encounters:  09/02/19 91.1 kg    Ideal Body Weight:  67.27 kg  BMI:  Body mass index is 31.46 kg/m.  Estimated Nutritional Needs:   Kcal:  [id number]:  105-120 g  Fluid:  2L/d   AmanLarkin Ina, RD, LDN Pager: [phone]/After Hours Pager: [phone]

## 2019-09-02 NOTE — Progress Notes (Signed)
Physical Therapy Weekly Progress Note  Patient Details  Name: Andres [name] MRN: [id number] Date of Birth: [birth year]  Beginning of progress report period: August 26, 2019 End of progress report period: September 02, 2019  Today's Date: 09/02/2019 PT Individual Time: 2683-4196 and 1530-1545 PT Individual Time Calculation (min): 60 min 15 min    Patient has met 3 of 4 short term goals.  Pt is making very slow progress towards LTG. Improved arousal has allowed pt to perform rolling in Bed R and L with max assist and mod assist at time, once able to initiate. Sit<>stand in parallel bars with max assist. Squat pivot and beezy board transfers with total A + 2 due to heavy posterior/ L pushing. Sit<>supine continues to require total A + 2 due to poor midline orientation and severe apraxia.   Patient continues to demonstrate the following deficits muscle weakness, muscle joint tightness and muscle paralysis, decreased cardiorespiratoy endurance, impaired timing and sequencing, motor apraxia, decreased coordination and decreased motor planning, decreased visual acuity, decreased visual perceptual skills, decreased visual motor skills, field cut and hemianopsia, decreased midline orientation, decreased attention to left, left side neglect, decreased motor planning and ideational apraxia, decreased initiation, decreased attention, decreased awareness, decreased problem solving, decreased safety awareness, decreased memory and delayed processing and decreased sitting balance, decreased standing balance, decreased postural control, hemiplegia and decreased balance strategies and therefore will continue to benefit from skilled PT intervention to increase functional independence with mobility.  Patient progressing toward long term goals..  Continue plan of care.  PT Short Term Goals Week 3:  PT Short Term Goal 1 (Week 3): Pt will maintain sitting balance EOB with mod assist PT Short Term Goal 2 (Week 3): Pt  will perform bed mobilty with max assist of 1 person consistently PT Short Term Goal 3 (Week 3): Pt will participate in reaching tasks with the R UE and initiate movement with only moderate assist 25% of the time  Skilled Therapeutic Interventions/Progress Updates:     Session 1  Pt received supine in bed and agreeable to PT. Rolling R and L to doff incontinent brief donn clean one with max assist of 2 people for time management. Maxi move transfer to [facility]. RN administering medication once pt up in [place].  PT instructed pt in sitting balance in WC and reaching task with RUE/LUE 2 x 10 with hand over hand assist for all initiate of movement; min assist from PT to encourage anterior weight shift in WC. PT attempted to instruct pt in ball kick and tap focused on initiation of movement. Pt able to initiate 25% of movement on the RLE to kick ball against foot, no initiation with LLE or BUE. PT adjusted WC head rest to improve posture and increase sitting tolerance with the hope to reducing pushers response to pressure points on occiput. Pt left sitting at RN station with all needs met and RN aware of pt position.   Session 2.  Pt received side lying in bed, in severely flexed posture with pt head in crease of bed. PT assisted pt to scoot to [facility] with total A +2 and max cues for encouraged use of BUE to push to [facility] not initiation of LE movement and pt resistant to manual facilitation of LE. Once pt was scooted to [facility], and placed in supine, was noted to fall asleep. Difficulty arousing pt with gentle stimuli. Due to severe lethargy, pt left supine in bed with all needs met and alarm activated.  Therapy Documentation Precautions:  Precautions Precautions: Fall Precaution Comments: Lt hemi Restrictions Weight Bearing Restrictions: No Pain: faces: none  General: missed time; 30 minutes. Pt fatigue.    Therapy/Group: Individual Therapy  Lorie Phenix 09/02/2019, 12:24 PM

## 2019-09-03 ENCOUNTER — Inpatient Hospital Stay (HOSPITAL_COMMUNITY): Payer: Medicare (Managed Care) | Admitting: Physical Therapy

## 2019-09-03 ENCOUNTER — Inpatient Hospital Stay (HOSPITAL_COMMUNITY): Payer: Medicare (Managed Care) | Admitting: Occupational Therapy

## 2019-09-03 LAB — GLUCOSE, CAPILLARY
Glucose-Capillary: 107 mg/dL — ABNORMAL HIGH (ref 70–99)
Glucose-Capillary: 127 mg/dL — ABNORMAL HIGH (ref 70–99)
Glucose-Capillary: 141 mg/dL — ABNORMAL HIGH (ref 70–99)
Glucose-Capillary: 145 mg/dL — ABNORMAL HIGH (ref 70–99)
Glucose-Capillary: 147 mg/dL — ABNORMAL HIGH (ref 70–99)
Glucose-Capillary: 163 mg/dL — ABNORMAL HIGH (ref 70–99)
Glucose-Capillary: 232 mg/dL — ABNORMAL HIGH (ref 70–99)
Glucose-Capillary: 333 mg/dL — ABNORMAL HIGH (ref 70–99)

## 2019-09-03 LAB — BASIC METABOLIC PANEL
Anion gap: 11 (ref 5–15)
BUN: 57 mg/dL — ABNORMAL HIGH (ref 8–23)
CO2: 33 mmol/L — ABNORMAL HIGH (ref 22–32)
Calcium: 10.3 mg/dL (ref 8.9–10.3)
Chloride: 101 mmol/L (ref 98–111)
Creatinine, Ser: 1.55 mg/dL — ABNORMAL HIGH (ref 0.61–1.24)
GFR calc Af Amer: 48 mL/min — ABNORMAL LOW (ref >60)
GFR calc non Af Amer: 42 mL/min — ABNORMAL LOW (ref >60)
Glucose, Bld: 180 mg/dL — ABNORMAL HIGH (ref 70–99)
Potassium: 4.6 mmol/L (ref 3.5–5.1)
Sodium: 145 mmol/L (ref 135–145)

## 2019-09-03 MED ORDER — AMLODIPINE BESYLATE 5 MG PO TABS
5.0000 mg | ORAL_TABLET | Freq: Every day | ORAL | Status: DC
  Administered 2019-09-03 – 2019-09-09 (×6): 5 mg via ORAL
  Filled 2019-09-03 (×6): qty 1

## 2019-09-03 NOTE — Progress Notes (Signed)
Physical Therapy Session Note  Patient Details  Name: Andres [name] MRN: [id number] Date of Birth: 19-May-1941  Today's Date: 09/03/2019 PT Individual Time: 0906-1000 PT Individual Time Calculation (min): 54 min   and  Today's Date: 09/03/2019 PT Missed Time: 21 Minutes Missed Time Reason: Patient fatigue  Short Term Goals: Week 3:  PT Short Term Goal 1 (Week 3): Pt will maintain sitting balance EOB with mod assist PT Short Term Goal 2 (Week 3): Pt will perform bed mobilty with max assist of 1 person consistently PT Short Term Goal 3 (Week 3): Pt will participate in reaching tasks with the R UE and initiate movement with only moderate assist 25% of the time  Skilled Therapeutic Interventions/Progress Updates:    Pt received supine in bed with RN exiting upon therapist arrival. Pt noted to be incontinent of bladder. Rolling R/L in bed with max/total +2 assist - pt demonstrating improved motor planning with rolling R compared to L though still significantly impaired - therapist facilitating LE positioning and hand-over-hand assist for moving UEs to assist with rolling. Therapist performed total assist LB clothing management and peri-care. Maximove transfer EOB>TIS w/c with +2 assist. While sitting in TIS w/c attempted to capture pt's attention to items on L but he was unable to scan L of midline. Performed face washing via hand-over-hand assistance with pt not able to help complete task and occasionally resisting the assistance - therapist ensured cleanliness. Performed oral care total assist while sitting in TIS w/c with use of suction toothbrush while working on upright activity tolerance. Pt then noted to be incontinent of bladder again. Maximove transfer TIS w/c>EOB with +2 assist. Again rolling R/L as described above while therapist again provided manual placement and manual facilitation for LE and UE positioning to assist with rolling task - therapist performed total assist LB clothing  management and peri-care. Pt noted to be very fatigued and starting to fall asleep. Therapeutically positioned pt in R sidelying with pillow support for pressure relief and pt asleep. Left with needs in reach and bed alarm on. Missed 21 minutes of skilled physical therapy.  Therapy Documentation Precautions:  Precautions Precautions: Fall Precaution Comments: Lt hemi Restrictions Weight Bearing Restrictions: No  Pain:   No indications of pain during session.    Therapy/Group: Individual Therapy  Tawana Scale, PT, DPT 09/03/2019, 7:50 AM

## 2019-09-03 NOTE — Progress Notes (Signed)
Occupational Therapy Weekly Progress Note  Patient Details  Name: Andres [name] MRN: [id number] Date of Birth: Sep 27, 1940  Beginning of progress report period: 08/27/2019 End of progress report period: 09/03/2019  Patient has met 1 of 3 short term goals.    Pt continues to make limited progress at time of report. He either exhibits profound lethargy or resistant behaviors combined with pushing tendencies during tx sessions. At this time attempting [facility] or toilet transfers is unsafe. He requires +2 for all self care tasks completed bedlevel and Total A for sinkside ADL activities. Pt is still unable to follow 1 step instruction given verbally or via any other cuing technique. Goals are set at Mod A overall. Continue POC.   Patient continues to demonstrate the following deficits: muscle weakness and muscle paralysis, decreased cardiorespiratoy endurance, abnormal tone and unbalanced muscle activation, decreased midline orientation, decreased attention to left, decreased motor planning and ideational apraxia, decreased initiation, decreased attention, decreased awareness, decreased problem solving and decreased safety awareness and decreased sitting balance, decreased postural control, hemiplegia and decreased balance strategies and therefore will continue to benefit from skilled OT intervention to enhance overall performance with BADL.  Patient progressing toward long term goals..  Continue plan of care.  OT Short Term Goals Week 2:  OT Short Term Goal 1 (Week 2): Pt will maintain sustained attention to 1 self care task for ~10 seconds with min HOH assist for initiation OT Short Term Goal 1 - Progress (Week 2): Progressing toward goal OT Short Term Goal 2 (Week 2): Pt will complete 1 self care task w/c level at the sink to increase OOB tolerance OT Short Term Goal 2 - Progress (Week 2): Met OT Short Term Goal 3 (Week 2): Pt will complete toilet or BSC transfer with +2 assist and LRAD OT Short Term  Goal 3 - Progress (Week 2): Not met Week 3:  OT Short Term Goal 1 (Week 3): Pt will maintain sustained attention to 1 self care task for ~10 seconds after given min HOH facilitation for initiation OT Short Term Goal 2 (Week 3): Pt will initiate threading 1 UE into UB dressing garment when presented with garment   Therapy Documentation Precautions:  Precautions Precautions: Fall Precaution Comments: Lt hemi Restrictions Weight Bearing Restrictions: No Vital Signs: Therapy Vitals Temp: 98 F (36.7 C) Pulse Rate: 92 Resp: 18 BP: (!) 177/90 Patient Position (if appropriate): Lying Oxygen Therapy SpO2: 96 % O2 Device: Room Air ADL: ADL Eating: Not assessed Grooming: Dependent Where Assessed-Grooming: Bed level Upper Body Bathing: Dependent Where Assessed-Upper Body Bathing: Edge of bed Lower Body Bathing: Dependent Where Assessed-Lower Body Bathing: Bed level Upper Body Dressing: Dependent Where Assessed-Upper Body Dressing: Edge of bed Lower Body Dressing: Dependent Where Assessed-Lower Body Dressing: Bed level Toileting: Not assessed Toilet Transfer: Not assessed Tub/Shower Transfer: Not assessed ADL Comments: Pt is dependent +2 assist during self care tasks excluding grooming (+1 assist) :     Therapy/Group: Individual Therapy  Ambriella Kitt A Dolora Ridgely 09/03/2019, 7:27 AM

## 2019-09-03 NOTE — Progress Notes (Signed)
Pt slept on and off during night. BM after miralx. Tolerating tube feed bolus well. Some upper airway congestion is noted

## 2019-09-03 NOTE — Progress Notes (Addendum)
Occupational Therapy Session Note  Patient Details  Name: Andres [name] MRN: [id number] Date of Birth: 08/17/1940  Today's Date: 09/03/2019 OT Individual Time:  -    charges entered on 2/1 for time completed 09/03/2019   - patient seen from 1300-1342 for 42 minutes  Short Term Goals: Week 2:  OT Short Term Goal 1 (Week 2): Pt will maintain sustained attention to 1 self care task for ~10 seconds with min HOH assist for initiation OT Short Term Goal 2 (Week 2): Pt will complete 1 self care task w/c level at the sink to increase OOB tolerance OT Short Term Goal 3 (Week 2): Pt will complete toilet or BSC transfer with +2 assist and LRAD  Skilled Therapeutic Interventions/Progress Updates:    Patient in bed, wife present for session.  Patient opens eyes with stimulation, no verbalizations or attempts to communicate.  occ eye contact with questions/remarks.  Attempted to sit edge of bed but patient resistive for legs off of bed and at trunk.  Completed bed mobility activities to include rolling, scooting, bridging - patient able to move all 4 extremities and repositions himself spontaneously but does not follow directions requiring max A for all bed mobility.  Completed AAROM/PROM bilateral UEs - he tends to resist and push in opposite direction but able to achieve full ROM with rhythmic motion and distraction  - right UE with at least 3/4 AROM noted with spontaneous activity, left at least 1/2 prox and 3/4 distal.  He will hold objects but does not use purposefully.  Reviewed activity and appropriate stimulation with wife - she demonstrates a good understanding.  Patient remained in bed, bed alarm set and wife at bed side.    Therapy Documentation Precautions:  Precautions Precautions: Fall Precaution Comments: Lt hemi Restrictions Weight Bearing Restrictions: No General:   Vital Signs: Therapy Vitals Temp: 98 F (36.7 C) Pulse Rate: 90 Resp: 20 BP: (!) 160/107 Patient Position (if  appropriate): Lying Oxygen Therapy SpO2: 94 % O2 Device: Room Air Pain: PAINAD (Pain Assessment in Advanced Dementia) Breathing: normal Negative Vocalization: none Facial Expression: smiling or inexpressive Body Language: relaxed Consolability: no need to console PAINAD Score: 0   Other Treatments:     Therapy/Group: Individual Therapy  Carlos Levering 09/03/2019, 3:35 PM

## 2019-09-03 NOTE — Progress Notes (Signed)
[facility] PHYSICAL MEDICINE & REHABILITATION PROGRESS NOTE  Subjective/Complaints:  +cough Sleeping, not easily arousable, but repositioning himself Spoke with sister at bedside.   Long phone conversation with patient's daughter  ROS: Limited due to cognitive/behavioral     Objective: Vital Signs: Blood pressure (!) 177/90, pulse 92, temperature 98 F (36.7 C), resp. rate 18, height 5\' 7"  (1.702 m), weight 90.6 kg, SpO2 96 %. No results found. No results for input(s): WBC, HGB, HCT, PLT in the last 72 hours. Recent Labs    09/01/19 0516 09/03/19 0739  NA 144 145  K 4.4 4.6  CL 103 101  CO2 28 33*  GLUCOSE 133* 180*  BUN 62* 57*  CREATININE 1.67* 1.55*  CALCIUM 9.8 10.3    Physical Exam: BP (!) 177/90 (BP Location: Right Arm)   Pulse 92   Temp 98 F (36.7 C)   Resp 18   Ht 5\' 7"  (1.702 m)   Wt 90.6 kg   SpO2 96%   BMI 31.28 kg/m  Constitutional: No distress . Vital signs reviewed. Sleeping, repositioning himself, sister is at bedside.  HEENT: EOMI, oral membranes moist Neck: supple Cardiovascular: RRR without murmur. No JVD    Respiratory: CTA Bilaterally without wheezes or rales. Normal effort    GI: BS +, non-tender, non-distended   Skin: Warm and dry.  Intact. Psych: slow to arouse, arouses to verbal and tactile stim  Musc: Lower extremity edema. Neurological:  Alert makes eye contact, distracted Dysarthria/anarthric Motor: 3- Left shouder , hip flexor and knee ext, (spont movement not to command )--no changes Sensory no withdrawal to pinch  Severe Left neglect without change  Assessment/Plan: 1. Functional deficits secondary to right MCA infarct which require 3+ hours per day of interdisciplinary therapy in a comprehensive inpatient rehab setting.  Physiatrist is providing close team supervision and 24 hour management of active medical problems listed below.  Physiatrist and rehab team continue to assess barriers to discharge/monitor patient  progress toward functional and medical goals  Care Tool:  Bathing        Body parts bathed by helper: (whole body)     Bathing assist Assist Level: 2 Helpers     Upper Body Dressing/Undressing Upper body dressing   What is the patient wearing?: Hospital gown only    Upper body assist Assist Level: Dependent - Patient 0%    Lower Body Dressing/Undressing Lower body dressing      What is the patient wearing?: Incontinence brief     Lower body assist Assist for lower body dressing: 2 Helpers     Toileting Toileting    Toileting assist Assist for toileting: 2 Helpers     Transfers Chair/bed transfer  Transfers assist     Chair/bed transfer assist level: Dependent - mechanical lift     Locomotion Ambulation   Ambulation assist   Ambulation activity did not occur: Safety/medical concerns          Walk 10 feet activity   Assist  Walk 10 feet activity did not occur: Safety/medical concerns        Walk 50 feet activity   Assist Walk 50 feet with 2 turns activity did not occur: Safety/medical concerns         Walk 150 feet activity   Assist Walk 150 feet activity did not occur: Safety/medical concerns         Walk 10 feet on uneven surface  activity   Assist Walk 10 feet on uneven surfaces activity did  not occur: Safety/medical concerns         Wheelchair     Assist   Type of Wheelchair: Manual    Wheelchair assist level: Dependent - Patient 0% Max wheelchair distance: 150    Wheelchair 50 feet with 2 turns activity    Assist        Assist Level: Dependent - Patient 0%   Wheelchair 150 feet activity     Assist     Assist Level: Dependent - Patient 0%      Medical Problem List and Plan: 1.  Left side weakness with dysarthria and dysphagia secondary to acute right MCA infarction  Continue CIR PT,  OT ,SLP   Plan is for Gastrostomy followed by SNF   Prognosis guarded due to size of R MCA infarct,  basically entire MCA territory   2.  Antithrombotics: -DVT/anticoagulation: Lovenox             -antiplatelet therapy: Aspirin 81 mg daily, Plavix 75 mg daily x3 months then Plavix alone 3. Pain Management: Voltaren gel as needed 4. Mood: Provide emotional support             -antipsychotic agents: N/A 5. Neuropsych: This patient is not capable of making decisions on his own behalf. 6. Skin/Wound Care: Routine skin checks 7. Fluids/Electrolytes/Nutrition: Routine in and outs.   cognition and pharyngeal dysphagia necessitate NPO status - cont TF for now but will not be able to continue this out of the hospital  - will discuss further with daughter since she does not wish to pursue open gastrostomy at this time (unable to do PEG due to anatomy)   Patient's daughter canceled the open gastrostomy for yesterday.  She states she has some other emergency.  She would like to have the procedure done on Tuesday.  We discussed that given his recent stroke he would be at increased risk to have another stroke under anesthesia she understands this.  We discussed that if he did not have a gastrostomy placed, palliative care consult would be needed to establish goals of care. 8.  Post stroke dysphagia.        N.p.o.             Nasogastric tube feeds.               Advance diet when more alert/able to tolerate 9.  Seizure disorder.  after discussion with daughter , reduced  Keppra  500mg  BID to reduce lethargy no seizures  EEG negative  No seizures since admission to rehab 10.  Essential hypertension.  Lopressor 25 mg twice daily   Vitals:   09/02/19 2015 09/03/19 0422  BP: (!) 170/79 (!) 177/90  Pulse: 91 92  Resp: 18 18  Temp: 98.3 F (36.8 C) 98 F (36.7 C)  SpO2: 96% 96%  elevated 1/28-29  will increase BB , will reduce ritalin dose  1/30 BP severely elevated. Will add amlodipine 5mg  daily.  11.  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  Hemoglobin  A1c 6.2. CBG (last 3)  Recent Labs    09/03/19 0408 09/03/19 0833 09/03/19 1139  GLUCAP 107* 163* 232*         labile use SSI 12.  CKD stage III.  -Worsening of renal function, stable over the last week however elevated compared to baseline.  Free water was increased but this has not made a significant change in BUN/creatinine.  Will ask nephrology to evaluate 13.  Coagulase-negative  staph.  Latest blood cultures question contaminant per infectious disease.  Ancef completed 08/19/2019.- afebrile              Repeat blood culture no growth 14.  CAD with history of CABG 1997.  Continue aspirin and Plavix 15.  Hyperlipidemia: Continue Lipitor 16.  BPH.  Flomax 0.4 mg daily.               incont with low residuals  17.  Morbid obesity.  BMI 35.70.  Dietary follow-up 18.  Acute blood loss anemia Resolved   Hemoglobin 12.3 on 1/14, 14.7 on 1/18  19.  Lethargy may be sleep wake cycle related, unremarkable CBC, BMET, CXR  neg start ritalin trial   Ritalin helpful for alertness 5mg =10mg , no improvement in function however, will reduce to 5 mg  Continue sleep chart 20. Family support: Reviewed patient's medical stability and recent performance in therapy with his sister and bedside.    LOS: 15 days A FACE TO FACE EVALUATION WAS PERFORMED  Martha Clan P Danyl Deems 09/03/2019, 2:03 PM

## 2019-09-03 NOTE — Plan of Care (Signed)
  Problem: Consults Goal: RH GENERAL PATIENT EDUCATION Description: See Patient Education module for education specifics. Outcome: Progressing Goal: Skin Care Protocol Initiated - if Braden Score 18 or less Description: If consults are not indicated, leave blank or document N/A Outcome: Progressing Goal: Nutrition Consult-if indicated Outcome: Progressing Goal: Diabetes Guidelines if Diabetic/Glucose > 140 Description: If diabetic or lab glucose is > 140 mg/dl - Initiate Diabetes/Hyperglycemia Guidelines & Document Interventions  Outcome: Progressing   Problem: RH BOWEL ELIMINATION Goal: RH STG MANAGE BOWEL WITH ASSISTANCE Description: STG Manage Bowel with mod Assistance. Outcome: Progressing Goal: RH STG MANAGE BOWEL W/MEDICATION W/ASSISTANCE Description: STG Manage Bowel with Medication with min Assistance. Outcome: Progressing   Problem: RH BLADDER ELIMINATION Goal: RH STG MANAGE BLADDER WITH ASSISTANCE Description: STG Manage Bladder With mod Assistance Outcome: Progressing   Problem: RH SKIN INTEGRITY Goal: RH STG SKIN FREE OF INFECTION/BREAKDOWN Description: No new breakdown prior to discharge Outcome: Progressing Goal: RH STG MAINTAIN SKIN INTEGRITY WITH ASSISTANCE Description: STG Maintain Skin Integrity With  mod Assistance. Outcome: Progressing   Problem: RH SAFETY Goal: RH STG ADHERE TO SAFETY PRECAUTIONS W/ASSISTANCE/DEVICE Description: STG Adhere to Safety Precautions With  mod Assistance/Device. Outcome: Progressing   Problem: RH PAIN MANAGEMENT Goal: RH STG PAIN MANAGED AT OR BELOW PT'S PAIN GOAL Description: Less than 4 Outcome: Progressing   Problem: RH KNOWLEDGE DEFICIT GENERAL Goal: RH STG INCREASE KNOWLEDGE OF SELF CARE AFTER HOSPITALIZATION Description: Patient 's family will be able to verbalize how to assist patient in self care prior to discharge Outcome: Progressing

## 2019-09-04 ENCOUNTER — Inpatient Hospital Stay (HOSPITAL_COMMUNITY): Payer: Medicare (Managed Care)

## 2019-09-04 ENCOUNTER — Inpatient Hospital Stay (HOSPITAL_COMMUNITY): Payer: Medicare (Managed Care) | Admitting: Speech Pathology

## 2019-09-04 ENCOUNTER — Inpatient Hospital Stay (HOSPITAL_COMMUNITY): Payer: Medicare (Managed Care) | Admitting: Occupational Therapy

## 2019-09-04 LAB — GLUCOSE, CAPILLARY
Glucose-Capillary: 116 mg/dL — ABNORMAL HIGH (ref 70–99)
Glucose-Capillary: 195 mg/dL — ABNORMAL HIGH (ref 70–99)
Glucose-Capillary: 165 mg/dL — ABNORMAL HIGH (ref 70–99)
Glucose-Capillary: 184 mg/dL — ABNORMAL HIGH (ref 70–99)
Glucose-Capillary: 139 mg/dL — ABNORMAL HIGH (ref 70–99)

## 2019-09-04 NOTE — Progress Notes (Signed)
Pt is really restless and moving around in bed  Pt opens eyes spontaneously, however not fully alert and oriented.

## 2019-09-04 NOTE — Progress Notes (Signed)
Occupational Therapy Session Note  Patient Details  Name: Andres [name] MRN: [id number] Date of Birth: 25-Jun-1941  Today's Date: 09/04/2019 OT Individual Time: 1400-1440 OT Individual Time Calculation (min): 40 min   Short Term Goals: Week 3:  OT Short Term Goal 1 (Week 3): Pt will maintain sustained attention to 1 self care task for ~10 seconds after given min HOH facilitation for initiation OT Short Term Goal 2 (Week 3): Pt will initiate threading 1 UE into UB dressing garment when presented with garment  Skilled Therapeutic Interventions/Progress Updates:    Pt greeted at RN station, very lethargic and appearing asleep. Alertness increased after a great deal of verbal and tactile cuing, however then pt closed his eyes again. Escorted pt via TIS to the dayroom. Played upbeat rock music to increase alertness. Pt with more eye opening and spontaneous movement LEs>UEs. With max multimodal cuing, including HOH, pt unable to follow 1 step instruction (I.e. unilateral hand tapping, leg kicks, or clapping). Gentle ROM B UEs completed while OT facilitated movement in beat to music, pt more resistant to ROM in R UE vs L UE. No initiation noted from pt in regards to participation. At end of session pt was returned to RN station, reclined in [place] for safety and comfort.     Therapy Documentation Precautions:  Precautions Precautions: Fall Precaution Comments: Lt hemi Restrictions Weight Bearing Restrictions: No Vital Signs: Therapy Vitals Pulse Rate: 90 Resp: 16 BP: (!) 143/83 Patient Position (if appropriate): Lying Oxygen Therapy SpO2: 100 % O2 Device: Room Air Pain: No s/s pain during tx Pain Assessment Pain Scale: Faces Faces Pain Scale: No hurt ADL: ADL Eating: Not assessed Grooming: Dependent Where Assessed-Grooming: Bed level Upper Body Bathing: Dependent Where Assessed-Upper Body Bathing: Edge of bed Lower Body Bathing: Dependent Where Assessed-Lower Body Bathing: Bed  level Upper Body Dressing: Dependent Where Assessed-Upper Body Dressing: Edge of bed Lower Body Dressing: Dependent Where Assessed-Lower Body Dressing: Bed level Toileting: Not assessed Toilet Transfer: Not assessed Tub/Shower Transfer: Not assessed ADL Comments: Pt is dependent +2 assist during self care tasks excluding grooming (+1 assist)      Therapy/Group: Individual Therapy  Sandrea Boer A Sterling Mondo 09/04/2019, 3:52 PM

## 2019-09-04 NOTE — Progress Notes (Signed)
Pt is still restless, moving around in bed, not fully alert. Pt at this time has had both doses of ritalin.

## 2019-09-04 NOTE — Progress Notes (Signed)
Pt is restless and moving around in bed. Pt eyes open spontaneously, however not fully alert. Both doses of ritalin given.

## 2019-09-04 NOTE — Plan of Care (Signed)
  Problem: Consults Goal: RH GENERAL PATIENT EDUCATION Description: See Patient Education module for education specifics. Outcome: Progressing Goal: Skin Care Protocol Initiated - if Braden Score 18 or less Description: If consults are not indicated, leave blank or document N/A Outcome: Progressing Goal: Nutrition Consult-if indicated Outcome: Progressing Goal: Diabetes Guidelines if Diabetic/Glucose > 140 Description: If diabetic or lab glucose is > 140 mg/dl - Initiate Diabetes/Hyperglycemia Guidelines & Document Interventions  Outcome: Progressing   Problem: RH BOWEL ELIMINATION Goal: RH STG MANAGE BOWEL WITH ASSISTANCE Description: STG Manage Bowel with mod Assistance. Outcome: Progressing Goal: RH STG MANAGE BOWEL W/MEDICATION W/ASSISTANCE Description: STG Manage Bowel with Medication with min Assistance. Outcome: Progressing   Problem: RH BLADDER ELIMINATION Goal: RH STG MANAGE BLADDER WITH ASSISTANCE Description: STG Manage Bladder With mod Assistance Outcome: Progressing   Problem: RH SKIN INTEGRITY Goal: RH STG SKIN FREE OF INFECTION/BREAKDOWN Description: No new breakdown prior to discharge Outcome: Progressing Goal: RH STG MAINTAIN SKIN INTEGRITY WITH ASSISTANCE Description: STG Maintain Skin Integrity With  mod Assistance. Outcome: Progressing   Problem: RH SAFETY Goal: RH STG ADHERE TO SAFETY PRECAUTIONS W/ASSISTANCE/DEVICE Description: STG Adhere to Safety Precautions With  mod Assistance/Device. Outcome: Progressing   Problem: RH PAIN MANAGEMENT Goal: RH STG PAIN MANAGED AT OR BELOW PT'S PAIN GOAL Description: Less than 4 Outcome: Progressing   Problem: RH KNOWLEDGE DEFICIT GENERAL Goal: RH STG INCREASE KNOWLEDGE OF SELF CARE AFTER HOSPITALIZATION Description: Patient 's family will be able to verbalize how to assist patient in self care prior to discharge Outcome: Progressing

## 2019-09-04 NOTE — Progress Notes (Signed)
Physical Therapy Session Note  Patient Details  Name: Andres [name] MRN: [id number] Date of Birth: [birth year]  Today's Date: 09/04/2019 PT Individual Time: 1102-1200 PT Individual Time Calculation (min): 58 min   Short Term Goals: Week 3:  PT Short Term Goal 1 (Week 3): Pt will maintain sitting balance EOB with mod assist PT Short Term Goal 2 (Week 3): Pt will perform bed mobilty with max assist of 1 person consistently PT Short Term Goal 3 (Week 3): Pt will participate in reaching tasks with the R UE and initiate movement with only moderate assist 25% of the time  Skilled Therapeutic Interventions/Progress Updates:    Pt supine in bed upon PT arrival, pt's sister helping pt to don teds and socks total assist. Pt alert at the start of session and turned head to therapist's voice on the R side. Pt given increased time and assist for hand placement on bedrail to roll towards the L with mod assist +2 while placing maximove sling, total +2 assist for rolling to the R. Maximove transfer this session from bed>w/c dependently with +2 assist. Pt in TIS w/c, performed lateral leans and then forward lean to remove sling. Pt transported to the gym dependently in w/c. Pt reclined in w/c with intermittent lethargy and closing his eyes. Pt tilted to upright in w/c and would briefly open eyes. Pt initiated forward trunk lean off the w/c back rest and worked on sitting balance throughout session, increased L Lean, therapist assisted to place patients L UE on armrest to use for support. Pt would sit up at edge of w/c for 2-3 minute bouts and min-mod assist from therapist. Therapist attempted to have patient participate in reaching activity for bean bags however pt not keeping eyes open, decreased alertness and difficulty following commands. Therapist attempted to work on cervical stretching into L rotation since pt noted to have R head perference, patient resistant to movement. Attempted to get patient to scan/look L  however pt too lethargic, difficulty participating. Pt transported to nurses station and left with chair alarm set and needs in reach, lap tray in place and under RN supervision.   Therapy Documentation Precautions:  Precautions Precautions: Fall Precaution Comments: Lt hemi Restrictions Weight Bearing Restrictions: No    Therapy/Group: Individual Therapy  Netta Corrigan, PT, DPT, CSRS 09/04/2019, 7:57 AM

## 2019-09-04 NOTE — Progress Notes (Signed)
Pt daughter called to talk about pt. She is concerned that Ritalin is causing him to be more agitated then helping him tay awake. Pt daughter is concerned that maybe the ritalin is the reason for not staying awake. Pt daughter shows concerns about him completing therapy with women, says "does better with males). Wants doctor to see pt in morning.

## 2019-09-04 NOTE — Progress Notes (Signed)
Speech Language Pathology Daily Session Note  Patient Details  Name: Andres [name] MRN: [id number] Date of Birth: April 16, 1941  Today's Date: 09/04/2019 SLP Individual Time: 0930-1000 SLP Individual Time Calculation (min): 30 min  Short Term Goals: Week 2: SLP Short Term Goal 1 (Week 2): Pt will consume therapeutic trials of thin liquids, ice chips, or purees with max assist multimodal cues for use of swallowing precautions and minimal overt s/s of aspiratin over 3 consecutive sessions prior to instrumental assessment. SLP Short Term Goal 1 - Progress (Week 2): Progressing toward goal SLP Short Term Goal 2 (Week 2): Pt will focus his attention to stimuli in >25% of opportunities with max assist multimodal cues. SLP Short Term Goal 2 - Progress (Week 2): Met SLP Short Term Goal 3 (Week 2): Pt will remain alert for >1 minute with max assist multimodal cues. SLP Short Term Goal 3 - Progress (Week 2): Met SLP Short Term Goal 4 (Week 2): Pt will orient to place, date, and situation with max assist multimodal cues. SLP Short Term Goal 4 - Progress (Week 2): Progressing toward goal SLP Short Term Goal 5 (Week 2): Pt will visually scan to the left of midline in >25% of opportunities during basic structured tasks with max assist multimodal cues. SLP Short Term Goal 5 - Progress (Week 2): Progressing toward goal  Skilled Therapeutic Interventions: Pt was seen for skilled ST targeting cognitive goals. Upon arrival pt sleeping soundly on his side, however opened his eyes and groaned with Max A multimodal cues. During functional bed mobility, pt did certainly make an attempt to scoot himself in bed with Max A multimodal cues, however ultimately Total A was required for sliding and repositioning to laying on his back and later sitting upright in bed. Pt maintained upright positioning for oral care (Total A required), however became increasingly lethargic and scooted himself back to laying semireclined and on  side and resistant to repositioning again, therefore unable to participate in any planned PO trials. Recommend continue NPO. Total A required for pt to hold wash cloth in his hand and hand over hand assist required for washing face with rag for ~5 seconds. Pt then resisted hand over hand assist and could not continue task. Pt nonverbal throughout session despite Max A encouragement and opportunities to respond to open ended, multiple choice, and yes/no questions. Pt achieved focused attention in ~50% opportunities, however unable to truly sustain attention for greater than 3-5 seconds before either closing his eyes or diverting his gaze. Pt left laying in bed with alarm on and needs met. Pt missed 15 mins skilled ST due to fatigue. Continue per current plan of care.       Pain Pain Assessment Pain Scale: 0-10 Pain Score: 0-No pain  Therapy/Group: Individual Therapy  Arbutus Leas 09/04/2019, 9:56 AM

## 2019-09-04 NOTE — Progress Notes (Signed)
[facility] PHYSICAL MEDICINE & REHABILITATION PROGRESS NOTE  Subjective/Complaints: Appears restless this morning. Eyes open, nonverbal Soothing cafe music is playing.   ROS: Limited due to cognitive/behavioral     Objective: Vital Signs: Blood pressure 114/67, pulse 72, temperature 99 F (37.2 C), resp. rate 20, height 5\' 7"  (1.702 m), weight 91.2 kg, SpO2 100 %. No results found. No results for input(s): WBC, HGB, HCT, PLT in the last 72 hours. Recent Labs    09/03/19 0739  NA 145  K 4.6  CL 101  CO2 33*  GLUCOSE 180*  BUN 57*  CREATININE 1.55*  CALCIUM 10.3    Physical Exam: BP 114/67 (BP Location: Right Arm)   Pulse 72   Temp 99 F (37.2 C)   Resp 20   Ht 5\' 7"  (1.702 m)   Wt 91.2 kg   SpO2 100%   BMI 31.49 kg/m  Constitutional: No distress . Vital signs reviewed. Restless, eyes open, nonverbal HEENT: EOMI, oral membranes moist Neck: supple Cardiovascular: RRR without murmur. No JVD    Respiratory: CTA Bilaterally without wheezes or rales. Normal effort    GI: BS +, non-tender, non-distended   Skin: Warm and dry.  Intact. Psych: slow to arouse, arouses to verbal and tactile stim  Musc: Lower extremity edema. Neurological:  Alert makes eye contact, distracted Dysarthria/anarthric Motor: 3- Left shouder , hip flexor and knee ext, (spont movement not to command )--no changes Sensory no withdrawal to pinch  Severe Left neglect without change  Assessment/Plan: 1. Functional deficits secondary to right MCA infarct which require 3+ hours per day of interdisciplinary therapy in a comprehensive inpatient rehab setting.  Physiatrist is providing close team supervision and 24 hour management of active medical problems listed below.  Physiatrist and rehab team continue to assess barriers to discharge/monitor patient progress toward functional and medical goals  Care Tool:  Bathing        Body parts bathed by helper: (whole body)     Bathing assist Assist  Level: 2 Helpers     Upper Body Dressing/Undressing Upper body dressing   What is the patient wearing?: Hospital gown only    Upper body assist Assist Level: Dependent - Patient 0%    Lower Body Dressing/Undressing Lower body dressing      What is the patient wearing?: Incontinence brief     Lower body assist Assist for lower body dressing: 2 Helpers     Toileting Toileting    Toileting assist Assist for toileting: 2 Helpers     Transfers Chair/bed transfer  Transfers assist     Chair/bed transfer assist level: Dependent - mechanical lift     Locomotion Ambulation   Ambulation assist   Ambulation activity did not occur: Safety/medical concerns          Walk 10 feet activity   Assist  Walk 10 feet activity did not occur: Safety/medical concerns        Walk 50 feet activity   Assist Walk 50 feet with 2 turns activity did not occur: Safety/medical concerns         Walk 150 feet activity   Assist Walk 150 feet activity did not occur: Safety/medical concerns         Walk 10 feet on uneven surface  activity   Assist Walk 10 feet on uneven surfaces activity did not occur: Safety/medical concerns         Wheelchair     Assist   Type of Wheelchair: Manual  Wheelchair assist level: Dependent - Patient 0% Max wheelchair distance: 150    Wheelchair 50 feet with 2 turns activity    Assist        Assist Level: Dependent - Patient 0%   Wheelchair 150 feet activity     Assist     Assist Level: Dependent - Patient 0%      Medical Problem List and Plan: 1.  Left side weakness with dysarthria and dysphagia secondary to acute right MCA infarction  Continue CIR PT,  OT ,SLP   Plan is for Gastrostomy followed by SNF   Prognosis guarded due to size of R MCA infarct, basically entire MCA territory   2.  Antithrombotics: -DVT/anticoagulation: Lovenox             -antiplatelet therapy: Aspirin 81 mg daily, Plavix  75 mg daily x3 months then Plavix alone 3. Pain Management: Voltaren gel as needed 4. Mood: Provide emotional support             -antipsychotic agents: N/A 5. Neuropsych: This patient is not capable of making decisions on his own behalf. 6. Skin/Wound Care: Routine skin checks 7. Fluids/Electrolytes/Nutrition: Routine in and outs.   cognition and pharyngeal dysphagia necessitate NPO status - cont TF for now but will not be able to continue this out of the hospital  - will discuss further with daughter since she does not wish to pursue open gastrostomy at this time (unable to do PEG due to anatomy)   Patient's daughter canceled the open gastrostomy for yesterday.  She states she has some other emergency.  She would like to have the procedure done on Tuesday.  We discussed that given his recent stroke he would be at increased risk to have another stroke under anesthesia she understands this.  We discussed that if he did not have a gastrostomy placed, palliative care consult would be needed to establish goals of care. 8.  Post stroke dysphagia.        N.p.o.             Nasogastric tube feeds.               Advance diet when more alert/able to tolerate 9.  Seizure disorder.  after discussion with daughter , reduced  Keppra  500mg  BID to reduce lethargy no seizures  EEG negative  No seizures since admission to rehab 10.  Essential hypertension.  Lopressor 25 mg twice daily   Vitals:   09/04/19 0357 09/04/19 0439  BP: 116/87 114/67  Pulse: 79 72  Resp: 18 20  Temp: 98.1 F (36.7 C) 99 F (37.2 C)  SpO2: 97% 100%  elevated 1/28-29  will increase BB , will reduce ritalin dose  1/30 BP severely elevated. Will add amlodipine 5mg  daily.  1/31: Much better controlled.  11.  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  Hemoglobin A1c 6.2. CBG (last 3)  Recent Labs    09/03/19 2358 09/04/19 0401 09/04/19 0733  GLUCAP 141* 116* 195*         labile  use SSI 12.  CKD stage III.  -Worsening of renal function, stable over the last week however elevated compared to baseline.  Free water was increased but this has not made a significant change in BUN/creatinine.  Will ask nephrology to evaluate 13.  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  Latest blood cultures question contaminant per infectious disease.  Ancef completed 08/19/2019.- afebrile  Repeat blood culture no growth 14.  CAD with history of CABG 1997.  Continue aspirin and Plavix 15.  Hyperlipidemia: Continue Lipitor 16.  BPH.  Flomax 0.4 mg daily.               incont with low residuals  17.  Morbid obesity.  BMI 35.70.  Dietary follow-up 18.  Acute blood loss anemia Resolved   Hemoglobin 12.3 on 1/14, 14.7 on 1/18  19.  Lethargy may be sleep wake cycle related, unremarkable CBC, BMET, CXR  neg start ritalin trial   Ritalin helpful for alertness 5mg =10mg , no improvement in function however, will reduce to 5 mg  Continue sleep chart 20. Family support: Reviewed patient's medical stability and recent performance in therapy with his sister and bedside.    LOS: 16 days A FACE TO FACE EVALUATION WAS PERFORMED  Martha Clan P Trek Kimball 09/04/2019, 11:35 AM

## 2019-09-05 ENCOUNTER — Inpatient Hospital Stay (HOSPITAL_COMMUNITY): Payer: Medicare (Managed Care) | Admitting: Physical Therapy

## 2019-09-05 ENCOUNTER — Inpatient Hospital Stay (HOSPITAL_COMMUNITY): Payer: Medicare (Managed Care)

## 2019-09-05 ENCOUNTER — Inpatient Hospital Stay (HOSPITAL_COMMUNITY): Payer: Medicare (Managed Care) | Admitting: Occupational Therapy

## 2019-09-05 ENCOUNTER — Inpatient Hospital Stay (HOSPITAL_COMMUNITY): Payer: Medicare (Managed Care) | Admitting: Speech Pathology

## 2019-09-05 LAB — GLUCOSE, CAPILLARY
Glucose-Capillary: 139 mg/dL — ABNORMAL HIGH (ref 70–99)
Glucose-Capillary: 141 mg/dL — ABNORMAL HIGH (ref 70–99)
Glucose-Capillary: 150 mg/dL — ABNORMAL HIGH (ref 70–99)
Glucose-Capillary: 155 mg/dL — ABNORMAL HIGH (ref 70–99)
Glucose-Capillary: 160 mg/dL — ABNORMAL HIGH (ref 70–99)
Glucose-Capillary: 172 mg/dL — ABNORMAL HIGH (ref 70–99)
Glucose-Capillary: 182 mg/dL — ABNORMAL HIGH (ref 70–99)

## 2019-09-05 MED ORDER — OSMOLITE 1.5 CAL PO LIQD
237.0000 mL | Freq: Every day | ORAL | Status: AC
  Administered 2019-09-05 – 2019-09-06 (×4): 237 mL
  Filled 2019-09-05 (×4): qty 237

## 2019-09-05 MED ORDER — OSMOLITE 1.5 CAL PO LIQD: 237.0000 mL | Freq: Every day | ORAL | Status: DC

## 2019-09-05 MED ORDER — METHYLPHENIDATE HCL 5 MG PO TABS
5.0000 mg | ORAL_TABLET | Freq: Every day | ORAL | Status: DC
  Administered 2019-09-07 – 2019-09-08 (×2): 5 mg via ORAL
  Filled 2019-09-05 (×3): qty 1

## 2019-09-05 MED ORDER — ENOXAPARIN SODIUM 40 MG/0.4ML ~~LOC~~ SOLN
40.0000 mg | Freq: Every day | SUBCUTANEOUS | Status: DC
  Administered 2019-09-07 – 2019-09-09 (×3): 40 mg via SUBCUTANEOUS
  Filled 2019-09-05 (×3): qty 0.4

## 2019-09-05 MED ORDER — CEFAZOLIN SODIUM-DEXTROSE 2-4 GM/100ML-% IV SOLN
2.0000 g | INTRAVENOUS | Status: AC
  Administered 2019-09-06: 2 g via INTRAVENOUS
  Filled 2019-09-05 (×2): qty 100

## 2019-09-05 NOTE — Progress Notes (Signed)
Patient ID: Andres [name], male   DOB: [birth year], 79 y.o.   MRN: [id number] Called and spoke to daughter, Ayaansh Juras, this morning regarding the need for g-tube vs j-tube.  She states she would like to proceed.  I discussed possible timing of surgery tomorrow if we don't have emergent cases that bump this til the following day.  She understood this concept.  We also discussed the procedure itself and our desire to place a g-tube but given his hiatal hernia, if we are unable to reduce the stomach enough to place a g-tube, then we may have to place a j-tube.  We discussed the differences in these tubes and she understood this as well.  We then discussed the procedures along with possible risks and complications such as bleeding, infection, tube leaking, need for further drains or surgery if tube pulled too early, risk for further CVA or progression of current CVA.  She understands.  All questions were answered.  She will give verbal consent over the phone to nursing staff.  We will call her once the procedure is done to let her know how everything went.  He will be NPO p MN, all TFs, supplements held, and plan for OR tomorrow pending availability.  [name] 9:10 AM 09/05/2019

## 2019-09-05 NOTE — Progress Notes (Signed)
Occupational Therapy Session Note  Patient Details  Name: Andres [name] MRN: [id number] Date of Birth: 16-May-1941  Today's Date: 09/05/2019 OT Individual Time: 1025-1105 and 1300-1331 OT Individual Time Calculation (min): 40 min and 31 min  Short Term Goals: Week 3:  OT Short Term Goal 1 (Week 3): Pt will maintain sustained attention to 1 self care task for ~10 seconds after given min HOH facilitation for initiation OT Short Term Goal 2 (Week 3): Pt will initiate threading 1 UE into UB dressing garment when presented with garment  Skilled Therapeutic Interventions/Progress Updates:    Pt greeted at RN station, appearing very lethargic with minimal eye opening when given verbal and tactile cuing. Escorted pt to his room and placed TIS in neutral position at sink. Pt initiated forward weight shift and placing R UE into sink. After a few minutes provided for sensory exploration, pt made no purposeful movements. Noted spontaneous Lt>Rt weight shifting/swaying. Set pt up to complete multiple self care tasks. Provided pt with initial HOH for initiation, and then gave pt ~5 minutes to initiate purposeful movement on his own given tactile cuing. Pt does not appear to visually scan/fixate to notice task at hand or exhibit any sort of awareness that task is setup. He can hold ADL items with Rt hand but is often resistant to [facility] assist Rt UE>Lt UE. Unable to hold items with Lt UE when they are placed in his hand. Pt required Total A for all self care tasks. At end of session pt passed off to SLP.   2nd Session 1:1 tx (31 min) Pt greeted at RN station, still very lethargic with minimal eye opening. Escorted pt back to room. +3 Beezy board transfer with pt unable to keep Rt hand on armrest to push, resistant to [facility] for hand placement. Facilitation for anterior weight shifting due to resistive/pushing behaviors. +2 for sit<supine and for boosting up in bed. Pts Rt hand was placed on headboard but he was unable to  initiate assist when given increased time. +2 for pericare and brief change due to incontinence. Pt needing Total A to roll Rt>Lt. Continued working on following 1 step instruction with elevating arms for deoderant application. Pt unable to follow 1 step instruction with this task. Provided pt with max multimodal cues and several minutes to initiate threading R UE into clean hospital gown, however pt needed Total A to do so. At end of session pt remained in bed, repositioned to protect hemiplegic side. Left him with all needs within reach, 4 bedrails up, and bed alarm set.   Therapy Documentation Precautions:  Precautions Precautions: Fall Precaution Comments: Lt hemi Restrictions Weight Bearing Restrictions: No Pain: No s/s pain during either tx sessions  Pain Assessment Pain Scale: Faces Faces Pain Scale: No hurt ADL: ADL Eating: Not assessed Grooming: Dependent Where Assessed-Grooming: Bed level Upper Body Bathing: Dependent Where Assessed-Upper Body Bathing: Edge of bed Lower Body Bathing: Dependent Where Assessed-Lower Body Bathing: Bed level Upper Body Dressing: Dependent Where Assessed-Upper Body Dressing: Edge of bed Lower Body Dressing: Dependent Where Assessed-Lower Body Dressing: Bed level Toileting: Not assessed Toilet Transfer: Not assessed Tub/Shower Transfer: Not assessed ADL Comments: Pt is dependent +2 assist during self care tasks excluding grooming (+1 assist)     Therapy/Group: Individual Therapy  Andres [name] 09/05/2019, 1:00 PM

## 2019-09-05 NOTE — Progress Notes (Signed)
Pt slept well during night, prefers laying on rt side. Wakes up when wet. Frequent heavy wetter due to TF and fluids.

## 2019-09-05 NOTE — Progress Notes (Signed)
Speech Language Pathology Daily Session Note  Patient Details  Name: Andres [name] MRN: [id number] Date of Birth: 1941/08/01  Today's Date: 09/05/2019 SLP Individual Time: 0900-0920 SLP Individual Time Calculation (min): 20 min  Short Term Goals: Week 3: SLP Short Term Goal 1 (Week 3): Pt will consume therapeutic trials of thin liquids, ice chips, or purees with max assist multimodal cues and minimal overt s/s of aspiration over 3 consecutive sessions prior to instrumental assessment. SLP Short Term Goal 2 (Week 3): Pt will focus his attention to stimuli in >50% of opportunities with max assist multimodal cues. SLP Short Term Goal 3 (Week 3): Pt will remain alert for >3 minutes with max assist multimodal cues. SLP Short Term Goal 4 (Week 3): Pt will orient to place, date, and situation with max assist multimodal cues. SLP Short Term Goal 5 (Week 3): Pt will visually scan to the left of midline in >25% of opportunities during basic structured tasks with max assist multimodal cues.  Skilled Therapeutic Interventions:  Skilled treatment session provided in conjunction with tube feedings in hopes that pt might produce swallow response with gastric sensation of bolus. SLP paced cup in pt's hand, attempted to place straw at his mouth, attempted to place ice chip in his mouth. Despite being alert, awake as well as upright in tilt-in-space wheelchair, pt was not attentive to any stimulation. Pt didn't open mouth and salvia frequently dripped from his mouth. Pt didn't focus on any stimulation and didn't locate SLP or nursing. Pt left reclined in tilt-in=space wheelchair at nursing station.      Pain Pain Assessment Pain Scale: Faces Faces Pain Scale: No hurt  Therapy/Group: Individual Therapy  Halina Asano 09/05/2019, 10:19 AM

## 2019-09-05 NOTE — Plan of Care (Signed)
  Problem: Consults Goal: RH GENERAL PATIENT EDUCATION Description: See Patient Education module for education specifics. Outcome: Progressing Goal: Skin Care Protocol Initiated - if Braden Score 18 or less Description: If consults are not indicated, leave blank or document N/A Outcome: Progressing Goal: Nutrition Consult-if indicated Outcome: Progressing Goal: Diabetes Guidelines if Diabetic/Glucose > 140 Description: If diabetic or lab glucose is > 140 mg/dl - Initiate Diabetes/Hyperglycemia Guidelines & Document Interventions  Outcome: Progressing   Problem: RH BOWEL ELIMINATION Goal: RH STG MANAGE BOWEL WITH ASSISTANCE Description: STG Manage Bowel with mod Assistance. Outcome: Progressing Goal: RH STG MANAGE BOWEL W/MEDICATION W/ASSISTANCE Description: STG Manage Bowel with Medication with min Assistance. Outcome: Progressing   Problem: RH SKIN INTEGRITY Goal: RH STG SKIN FREE OF INFECTION/BREAKDOWN Description: No new breakdown prior to discharge Outcome: Progressing Goal: RH STG MAINTAIN SKIN INTEGRITY WITH ASSISTANCE Description: STG Maintain Skin Integrity With  mod Assistance. Outcome: Progressing   Problem: RH SAFETY Goal: RH STG ADHERE TO SAFETY PRECAUTIONS W/ASSISTANCE/DEVICE Description: STG Adhere to Safety Precautions With  mod Assistance/Device. Outcome: Progressing   Problem: RH PAIN MANAGEMENT Goal: RH STG PAIN MANAGED AT OR BELOW PT'S PAIN GOAL Description: Less than 4 Outcome: Progressing   Problem: RH KNOWLEDGE DEFICIT GENERAL Goal: RH STG INCREASE KNOWLEDGE OF SELF CARE AFTER HOSPITALIZATION Description: Patient 's family will be able to verbalize how to assist patient in self care prior to discharge Outcome: Progressing   Problem: RH BLADDER ELIMINATION Goal: RH STG MANAGE BLADDER WITH ASSISTANCE Description: STG Manage Bladder With mod Assistance Outcome: Not Progressing

## 2019-09-05 NOTE — Plan of Care (Signed)
  Problem: RH Swallowing Goal: LTG Patient will consume least restrictive diet using compensatory strategies with assistance (SLP) Description: LTG:  Patient will consume least restrictive diet using compensatory strategies with assistance (SLP) Outcome: Not Applicable Goal: LTG Pt will demonstrate functional change in swallow as evidenced by bedside/clinical objective assessment (SLP) Description: LTG: Patient will demonstrate functional change in swallow as evidenced by bedside/clinical objective assessment (SLP) Outcome: Not Applicable  SLP is still targeting goal for PO consumption, however due to lack of progress and severe cognitive impairments, full PO diet unlikely during this admission and these goals are no longer appropriate for pt.   ST will continue to target oral acceptance of POs as appropriate.

## 2019-09-05 NOTE — Progress Notes (Signed)
[facility] PHYSICAL MEDICINE & REHABILITATION PROGRESS NOTE  Subjective/Complaints:  Pt is at nursing desk, awake and restless   ROS: Limited due to cognitive/behavioral     Objective: Vital Signs: Blood pressure (!) 102/91, pulse 90, temperature 98.2 F (36.8 C), resp. rate 17, height 5\' 7"  (1.702 m), weight 90.2 kg, SpO2 95 %. No results found. No results for input(s): WBC, HGB, HCT, PLT in the last 72 hours. Recent Labs    09/03/19 0739  NA 145  K 4.6  CL 101  CO2 33*  GLUCOSE 180*  BUN 57*  CREATININE 1.55*  CALCIUM 10.3    Physical Exam: BP (!) 102/91 (BP Location: Right Arm)   Pulse 90   Temp 98.2 F (36.8 C)   Resp 17   Ht 5\' 7"  (1.702 m)   Wt 90.2 kg   SpO2 95%   BMI 31.15 kg/m  Constitutional: No distress . Vital signs reviewed. Restless, eyes open, nonverbal HEENT: EOMI, oral membranes moist Neck: supple Cardiovascular: RRR without murmur. No JVD    Respiratory: CTA Bilaterally without wheezes or rales. Normal effort    GI: BS +, non-tender, non-distended   Skin: Warm and dry.  Intact. Psych: slow to arouse, arouses to verbal and tactile stim  Musc: Lower extremity edema. Neurological:  Alert makes eye contact, distracted Dysarthria/anarthric Motor: 3- Left shouder , hip flexor and knee ext, (spont movement not to command )--no changes Sensory no withdrawal to pinch  Severe Left neglect without change  Assessment/Plan: 1. Functional deficits secondary to right MCA infarct which require 3+ hours per day of interdisciplinary therapy in a comprehensive inpatient rehab setting.  Physiatrist is providing close team supervision and 24 hour management of active medical problems listed below.  Physiatrist and rehab team continue to assess barriers to discharge/monitor patient progress toward functional and medical goals  Care Tool:  Bathing        Body parts bathed by helper: (whole body)     Bathing assist Assist Level: 2 Helpers     Upper  Body Dressing/Undressing Upper body dressing   What is the patient wearing?: Hospital gown only    Upper body assist Assist Level: Dependent - Patient 0%    Lower Body Dressing/Undressing Lower body dressing      What is the patient wearing?: Incontinence brief     Lower body assist Assist for lower body dressing: 2 Helpers     Toileting Toileting    Toileting assist Assist for toileting: 2 Helpers     Transfers Chair/bed transfer  Transfers assist     Chair/bed transfer assist level: Dependent - mechanical lift     Locomotion Ambulation   Ambulation assist   Ambulation activity did not occur: Safety/medical concerns          Walk 10 feet activity   Assist  Walk 10 feet activity did not occur: Safety/medical concerns        Walk 50 feet activity   Assist Walk 50 feet with 2 turns activity did not occur: Safety/medical concerns         Walk 150 feet activity   Assist Walk 150 feet activity did not occur: Safety/medical concerns         Walk 10 feet on uneven surface  activity   Assist Walk 10 feet on uneven surfaces activity did not occur: Safety/medical concerns         Wheelchair     Assist   Type of Wheelchair: Manual  Wheelchair assist level: Dependent - Patient 0% Max wheelchair distance: 150    Wheelchair 50 feet with 2 turns activity    Assist        Assist Level: Dependent - Patient 0%   Wheelchair 150 feet activity     Assist     Assist Level: Dependent - Patient 0%      Medical Problem List and Plan: 1.  Left side weakness with dysarthria and dysphagia secondary to acute right MCA infarction  Continue CIR PT,  OT ,SLP   Plan is for Gastrostomy followed by SNF   Prognosis guarded due to size of R MCA infarct, basically entire MCA territory   2.  Antithrombotics: -DVT/anticoagulation: Lovenox             -antiplatelet therapy: Aspirin 81 mg daily, Plavix 75 mg daily x3 months then  Plavix alone 3. Pain Management: Voltaren gel as needed 4. Mood: Provide emotional support             -antipsychotic agents: N/A 5. Neuropsych: This patient is not capable of making decisions on his own behalf. 6. Skin/Wound Care: Routine skin checks 7. Fluids/Electrolytes/Nutrition: Routine in and outs.   cognition and pharyngeal dysphagia necessitate NPO status - cont TF for now but will not be able to continue this out of the hospital  - Patient's daughter canceled the open gastrostomy for Friday .  She states she has some other emergency.  She would like to have the procedure done on Tuesday.  We discussed that given his recent stroke he would be at increased risk to have another stroke under anesthesia she understands this.  We discussed that if he did not have a gastrostomy placed, palliative care consult would be needed to establish goals of care. 8.  Post stroke dysphagia.        N.p.o.             Nasogastric tube feeds.               Advance diet when more alert/able to tolerate 9.  Seizure disorder.  after discussion with daughter , reduced  Keppra  500mg  BID to reduce lethargy no seizures  EEG negative  No seizures since admission to rehab 10.  Essential hypertension.  Lopressor 25 mg twice daily   Vitals:   09/04/19 1940 09/05/19 0400  BP: (!) 160/88 (!) 102/91  Pulse: 91 90  Resp: 18 17  Temp: 97.9 F (36.6 C) 98.2 F (36.8 C)  SpO2: 91% 95%   1/30 BP severely elevated. Will add amlodipine 5mg  daily.  1/31: Much better controlled. Lability noted  11.  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  Hemoglobin A1c 6.2. CBG (last 3)  Recent Labs    09/05/19 0045 09/05/19 0403 09/05/19 0724  GLUCAP 160* 141* 150*         labile use SSI 12.  CKD stage III.  Nephrology feels BUN/Creat elevation is due to inadequate free13.  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  Latest blood cultures question contaminant per infectious disease.  Ancef  completed 08/19/2019.- afebrile              Repeat blood culture no growth 14.  CAD with history of CABG 1997.  Continue aspirin and Plavix 15.  Hyperlipidemia: Continue Lipitor 16.  BPH.  Flomax 0.4 mg daily.               incont with low residuals  17.  Morbid obesity.  BMI 35.70.  Dietary follow-up 18.  Acute blood loss anemia Resolved   Hemoglobin 12.3 on 1/14, 14.7 on 1/18  19.  Lethargy may be sleep wake cycle related, unremarkable CBC, BMET, CXR  neg start ritalin trial   Ritalin helpful for alertness 5mg =10mg , no improvement in function however,reduced to 5 mg, will change to am only   Continue sleep chart 20. Family support: family involved, unable to provide 24/7 care post d/c  LOS: 17 days A FACE TO [place] 09/05/2019, 8:46 AM

## 2019-09-05 NOTE — Progress Notes (Signed)
Physical Therapy Session Note  Patient Details  Name: Andres [name] MRN: [id number] Date of Birth: Aug 08, 1940  Today's Date: 09/05/2019 PT Individual Time: [phone] and 1430-1455 PT Individual Time Calculation (min): 54 min and 25 min  Short Term Goals: Week 3:  PT Short Term Goal 1 (Week 3): Pt will maintain sitting balance EOB with mod assist PT Short Term Goal 2 (Week 3): Pt will perform bed mobilty with max assist of 1 person consistently PT Short Term Goal 3 (Week 3): Pt will participate in reaching tasks with the R UE and initiate movement with only moderate assist 25% of the time  Skilled Therapeutic Interventions/Progress Updates: Pt presented in bed awake. Pt rolled to L maxA to check brief and noted to be clean and dry. Pt returned to supine maxA with some resistance from pt. Pt then rolled to L sidelying and performed supine to sit with maxA x1. Pt initially with heavy posterior pushing initially upon standing however PTA able to sit on R side of pt and pt became less resistive and was able to sit with minA primarily with bouts up to 1 min for CGA. Pt then transferred to [place] via [employer] x2 with total A for board placement. PTA provided warm washcloth to pt's' hand however did not demonstrate any initiation to wipe face and provided resistance when attempted [facility] assist to wipe face. Pt then transported to ortho gym and attempted to have pt initiate reaching with LUE. Pt initially resistant however improved when provided with Jazz music and performed AAROM with LUE. Pt was able to sit upright in TIS with minA from PTA and was able to maintain sitting upright >5 min. PTA sat to L of pt and attempted to have pt turn to L however pt only intermittently turn to midline. Pt transported back and full lap tray placed. Pt then handed off to [facility], nurse to provide am meds.   Tx2: Pt presented in bed sleeping but easily aroused. Pt rolled to L maxA and performed supine to sit maxA x1. Pt  with heavy posterior lean upon sitting with strong pushing. With PTA sitting on R side pt was able to sit with minA and note that pt was tapping L foot consistently with music played this session. Due to pt being closer to foot of bed attempted to perform lateral scoot to [facility] however pt with no initiation and was unable to move laterally. Stedy then obtained and pt was able to perform STS in [place] maxA x 2. Pt noted to be moving shoulders in near rhythm to music while seated in [place] Performed stand from [place] x 2 and returned to bed. Pt required maxA x 2 for sit to supine and pt promptly falling asleep once in bed. Pt left resting comfortably with bed alarm on.      Therapy Documentation Precautions:  Precautions Precautions: Fall Precaution Comments: Lt hemi Restrictions Weight Bearing Restrictions: No General:   Vital Signs: Therapy Vitals Temp: (!) 97.1 F (36.2 C) Pulse Rate: 84 Resp: 16 BP: 130/86 Patient Position (if appropriate): Lying Oxygen Therapy SpO2: 98 % O2 Device: Room Air Pain:   Mobility:   Locomotion :    Trunk/Postural Assessment :    Balance:   Exercises:   Other Treatments:      Therapy/Group: Individual Therapy  Clancy Leiner 09/05/2019, 3:48 PM

## 2019-09-05 NOTE — Progress Notes (Signed)
Speech Language Pathology Daily Session Note  Patient Details  Name: Andres [name] MRN: [id number] Date of Birth: [birth year]  Today's Date: 09/05/2019 SLP Individual Time: 1105-1120 SLP Individual Time Calculation (min): 15 min  Short Term Goals: Week 1: SLP Short Term Goal 1 (Week 1): Pt will consume therapeutic trials of thin liquids, ice chips, or purees with max assist multimodal cues for use of swallowing precautions and minimal overt s/s of aspiratin over 3 consecutive sessions prior to instrumental assessment. SLP Short Term Goal 1 - Progress (Week 1): Progressing toward goal SLP Short Term Goal 2 (Week 1): Pt will focus his attention to stimuli in >25% of opportunities with max assist multimodal cues. SLP Short Term Goal 2 - Progress (Week 1): Progressing toward goal SLP Short Term Goal 3 (Week 1): Pt will remain alert for >1 minute with max assist multimodal cues. SLP Short Term Goal 3 - Progress (Week 1): Progressing toward goal SLP Short Term Goal 4 (Week 1): Pt will orient to place, date, and situation with max assist multimodal cues. SLP Short Term Goal 4 - Progress (Week 1): Progressing toward goal SLP Short Term Goal 5 (Week 1): Pt will visually scan to the left of midline in >25% of opportunities during basic structured tasks with max assist multimodal cues. SLP Short Term Goal 5 - Progress (Week 1): Progressing toward goal  Skilled Therapeutic Interventions: Pt was seen for skilled ST targeting cognition, specifically arousal, task initiation, and focused attention. Pt sleeping soundly in tilt in space chair upon arrival. Pt awakened to cool wash rag on forehead and SLP beginning oral care via suction brush. Provided Max A multimodal cues, pt did initiate grasping toothbrush with right hand and attempted to lift it (approximately 1 inch off of his tray), however required Total A moving forward to complete oral care. Pt did not initiate when provide Max A multimodal cues to  reposition is head to midline or wash face with rag. Pt followed 1 direction to place his foot back on footrest. He did not provide any verbal responses to clinician's questions throughout session. Provided Max A multimodal cues, pt achieved focused attention X1 (<25% opportunities) today, however diverted his gaze after ~3 seconds. Pt left siting in tilt in space chair with seatbelt alarm in place and at nursing station for safety. Continue per current plan of care.       Pain Pain Assessment Pain Scale: Faces Faces Pain Scale: No hurt  Therapy/Group: Individual Therapy  Arbutus Leas 09/05/2019, 11:39 AM

## 2019-09-05 NOTE — Progress Notes (Signed)
Verbal consent given to this RN for g-tube placement tomorrow, verified by 2nd RN as well. Informed consent placed in pt chart. Will cont to monitor.  Erie Noe, RN

## 2019-09-06 ENCOUNTER — Encounter (HOSPITAL_COMMUNITY): Payer: Self-pay | Admitting: Physical Medicine & Rehabilitation

## 2019-09-06 ENCOUNTER — Inpatient Hospital Stay (HOSPITAL_COMMUNITY): Payer: Medicare (Managed Care) | Admitting: *Deleted

## 2019-09-06 ENCOUNTER — Inpatient Hospital Stay (HOSPITAL_COMMUNITY): Payer: Medicare (Managed Care) | Admitting: Occupational Therapy

## 2019-09-06 ENCOUNTER — Inpatient Hospital Stay (HOSPITAL_COMMUNITY): Payer: Medicare (Managed Care) | Admitting: Registered Nurse

## 2019-09-06 ENCOUNTER — Encounter (HOSPITAL_COMMUNITY)
Admission: RE | Disposition: A | Discharge status: Other Acute Inpatient Hospital | Payer: Self-pay | Source: Intra-hospital | Attending: Physical Medicine & Rehabilitation

## 2019-09-06 ENCOUNTER — Inpatient Hospital Stay (HOSPITAL_COMMUNITY): Payer: Medicare (Managed Care)

## 2019-09-06 ENCOUNTER — Inpatient Hospital Stay (HOSPITAL_COMMUNITY): Admission: RE | Admit: 2019-09-06 | Payer: Medicare (Managed Care) | Source: Home / Self Care | Admitting: Surgery

## 2019-09-06 ENCOUNTER — Inpatient Hospital Stay (HOSPITAL_COMMUNITY): Payer: Medicare (Managed Care) | Admitting: Speech Pathology

## 2019-09-06 HISTORY — PX: GASTROSTOMY: SHX5249

## 2019-09-06 LAB — GLUCOSE, CAPILLARY
Glucose-Capillary: 167 mg/dL — ABNORMAL HIGH (ref 70–99)
Glucose-Capillary: 162 mg/dL — ABNORMAL HIGH (ref 70–99)
Glucose-Capillary: 168 mg/dL — ABNORMAL HIGH (ref 70–99)
Glucose-Capillary: 147 mg/dL — ABNORMAL HIGH (ref 70–99)
Glucose-Capillary: 138 mg/dL — ABNORMAL HIGH (ref 70–99)
Glucose-Capillary: 139 mg/dL — ABNORMAL HIGH (ref 70–99)

## 2019-09-06 LAB — MRSA PCR SCREENING: MRSA by PCR: NEGATIVE

## 2019-09-06 SURGERY — INSERTION OF GASTROSTOMY TUBE
Anesthesia: General | Site: Abdomen

## 2019-09-06 MED ORDER — LACTATED RINGERS IV SOLN: INTRAVENOUS | Status: DC | PRN

## 2019-09-06 MED ORDER — ROCURONIUM BROMIDE 10 MG/ML (PF) SYRINGE
PREFILLED_SYRINGE | INTRAVENOUS | Status: DC | PRN
  Administered 2019-09-06: 30 mg via INTRAVENOUS
  Administered 2019-09-06: 10 mg via INTRAVENOUS

## 2019-09-06 MED ORDER — SUCCINYLCHOLINE CHLORIDE 200 MG/10ML IV SOSY
PREFILLED_SYRINGE | INTRAVENOUS | Status: DC | PRN
  Administered 2019-09-06: 120 mg via INTRAVENOUS

## 2019-09-06 MED ORDER — FENTANYL CITRATE (PF) 250 MCG/5ML IJ SOLN
INTRAMUSCULAR | Status: AC
  Filled 2019-09-06: qty 5

## 2019-09-06 MED ORDER — ONDANSETRON HCL 4 MG/2ML IJ SOLN
INTRAMUSCULAR | Status: AC
  Filled 2019-09-06: qty 2

## 2019-09-06 MED ORDER — ONDANSETRON HCL 4 MG/2ML IJ SOLN
INTRAMUSCULAR | Status: DC | PRN
  Administered 2019-09-06: 4 mg via INTRAVENOUS

## 2019-09-06 MED ORDER — PROPOFOL 10 MG/ML IV BOLUS
INTRAVENOUS | Status: DC | PRN
  Administered 2019-09-06: 140 mg via INTRAVENOUS

## 2019-09-06 MED ORDER — PHENYLEPHRINE 40 MCG/ML (10ML) SYRINGE FOR IV PUSH (FOR BLOOD PRESSURE SUPPORT)
PREFILLED_SYRINGE | INTRAVENOUS | Status: DC | PRN
  Administered 2019-09-06: 120 ug via INTRAVENOUS
  Administered 2019-09-06: 40 ug via INTRAVENOUS
  Administered 2019-09-06 (×2): 120 ug via INTRAVENOUS

## 2019-09-06 MED ORDER — EPHEDRINE SULFATE-NACL 50-0.9 MG/10ML-% IV SOSY
PREFILLED_SYRINGE | INTRAVENOUS | Status: DC | PRN
  Administered 2019-09-06: 10 mg via INTRAVENOUS

## 2019-09-06 MED ORDER — CALCIUM CHLORIDE 10 % IV SOLN
INTRAVENOUS | Status: DC | PRN
  Administered 2019-09-06: 500 mg via INTRAVENOUS

## 2019-09-06 MED ORDER — FENTANYL CITRATE (PF) 250 MCG/5ML IJ SOLN
INTRAMUSCULAR | Status: DC | PRN
  Administered 2019-09-06: 100 ug via INTRAVENOUS

## 2019-09-06 MED ORDER — LIDOCAINE 2% (20 MG/ML) 5 ML SYRINGE
INTRAMUSCULAR | Status: AC
  Filled 2019-09-06: qty 5

## 2019-09-06 MED ORDER — PHENYLEPHRINE HCL-NACL 10-0.9 MG/250ML-% IV SOLN
INTRAVENOUS | Status: DC | PRN
  Administered 2019-09-06: 75 ug/min via INTRAVENOUS

## 2019-09-06 MED ORDER — PROPOFOL 10 MG/ML IV BOLUS
INTRAVENOUS | Status: AC
  Filled 2019-09-06: qty 20

## 2019-09-06 MED ORDER — STERILE WATER FOR IRRIGATION IR SOLN
Status: DC | PRN
  Administered 2019-09-06: 1000 mL

## 2019-09-06 MED ORDER — EPHEDRINE 5 MG/ML INJ
INTRAVENOUS | Status: AC
  Filled 2019-09-06: qty 10

## 2019-09-06 MED ORDER — OXYCODONE HCL 5 MG/5ML PO SOLN
5.0000 mg | Freq: Four times a day (QID) | ORAL | Status: DC | PRN
  Administered 2019-09-07 – 2019-09-08 (×3): 5 mg via ORAL
  Filled 2019-09-06 (×4): qty 5

## 2019-09-06 MED ORDER — LIDOCAINE 2% (20 MG/ML) 5 ML SYRINGE
INTRAMUSCULAR | Status: DC | PRN
  Administered 2019-09-06: 100 mg via INTRAVENOUS

## 2019-09-06 MED ORDER — BUPIVACAINE HCL 0.25 % IJ SOLN
INTRAMUSCULAR | Status: DC | PRN
  Administered 2019-09-06: 20 mL

## 2019-09-06 MED ORDER — 0.9 % SODIUM CHLORIDE (POUR BTL) OPTIME
TOPICAL | Status: DC | PRN
  Administered 2019-09-06: 1000 mL

## 2019-09-06 MED ORDER — LACTATED RINGERS IV SOLN: INTRAVENOUS | Status: DC

## 2019-09-06 MED ORDER — SUCCINYLCHOLINE CHLORIDE 200 MG/10ML IV SOSY
PREFILLED_SYRINGE | INTRAVENOUS | Status: AC
  Filled 2019-09-06: qty 10

## 2019-09-06 MED ORDER — SUGAMMADEX SODIUM 200 MG/2ML IV SOLN
INTRAVENOUS | Status: DC | PRN
  Administered 2019-09-06: 200 mg via INTRAVENOUS

## 2019-09-06 MED ORDER — ALBUMIN HUMAN 5 % IV SOLN: INTRAVENOUS | Status: DC | PRN

## 2019-09-06 MED ORDER — PHENYLEPHRINE 40 MCG/ML (10ML) SYRINGE FOR IV PUSH (FOR BLOOD PRESSURE SUPPORT)
PREFILLED_SYRINGE | INTRAVENOUS | Status: AC
  Filled 2019-09-06: qty 10

## 2019-09-06 MED ORDER — ROCURONIUM BROMIDE 10 MG/ML (PF) SYRINGE
PREFILLED_SYRINGE | INTRAVENOUS | Status: AC
  Filled 2019-09-06: qty 10

## 2019-09-06 MED ORDER — BUPIVACAINE HCL (PF) 0.25 % IJ SOLN
INTRAMUSCULAR | Status: AC
  Filled 2019-09-06: qty 30

## 2019-09-06 SURGICAL SUPPLY — 48 items
APL PRP STRL LF DISP 70% ISPRP (MISCELLANEOUS) ×1
APL SKNCLS STERI-STRIP NONHPOA (GAUZE/BANDAGES/DRESSINGS) ×1
BAG URINE DRAINAGE (UROLOGICAL SUPPLIES) ×2 IMPLANT
BENZOIN TINCTURE PRP APPL 2/3 (GAUZE/BANDAGES/DRESSINGS) ×1 IMPLANT
BINDER ABDOMINAL 12 XL 75-84 (SOFTGOODS) ×1 IMPLANT
BLADE CLIPPER SURG (BLADE) IMPLANT
CANISTER SUCT 3000ML PPV (MISCELLANEOUS) ×2 IMPLANT
CATH GASTROSTOMY 20FR (CATHETERS) IMPLANT
CHLORAPREP W/TINT 26 (MISCELLANEOUS) ×2 IMPLANT
CLOSURE STERI-STRIP 1/4X4 (GAUZE/BANDAGES/DRESSINGS) ×2 IMPLANT
COVER WAND RF STERILE (DRAPES) ×1 IMPLANT
DRAPE LAPAROSCOPIC ABDOMINAL (DRAPES) ×2 IMPLANT
DRSG TEGADERM 4X4.5 CHG (GAUZE/BANDAGES/DRESSINGS) ×3 IMPLANT
ELECT CAUTERY BLADE 6.4 (BLADE) ×2 IMPLANT
ELECT REM PT RETURN 9FT ADLT (ELECTROSURGICAL) ×2
ELECTRODE REM PT RTRN 9FT ADLT (ELECTROSURGICAL) ×1 IMPLANT
GAUZE SPONGE 4X4 12PLY STRL (GAUZE/BANDAGES/DRESSINGS) ×2 IMPLANT
GAUZE SPONGE 4X4 12PLY STRL LF (GAUZE/BANDAGES/DRESSINGS) ×1 IMPLANT
GLOVE BIO SURGEON STRL SZ7 (GLOVE) ×2 IMPLANT
GLOVE BIOGEL PI IND STRL 7.5 (GLOVE) ×1 IMPLANT
GLOVE BIOGEL PI INDICATOR 7.5 (GLOVE) ×1
GOWN STRL REUS W/ TWL LRG LVL3 (GOWN DISPOSABLE) ×2 IMPLANT
GOWN STRL REUS W/TWL LRG LVL3 (GOWN DISPOSABLE) ×8
HANDLE SUCTION POOLE (INSTRUMENTS) IMPLANT
KIT BASIN OR (CUSTOM PROCEDURE TRAY) ×2 IMPLANT
KIT TURNOVER KIT B (KITS) ×2 IMPLANT
NDL HYPO 25GX1X1/2 BEV (NEEDLE) ×1 IMPLANT
NEEDLE HYPO 25GX1X1/2 BEV (NEEDLE) ×2 IMPLANT
NS IRRIG 1000ML POUR BTL (IV SOLUTION) ×2 IMPLANT
PACK GENERAL/GYN (CUSTOM PROCEDURE TRAY) ×2 IMPLANT
PAD ARMBOARD 7.5X6 YLW CONV (MISCELLANEOUS) ×2 IMPLANT
PENCIL SMOKE EVACUATOR (MISCELLANEOUS) ×2 IMPLANT
PLUG CATH AND CAP STER (CATHETERS) IMPLANT
STAPLER VISISTAT 35W (STAPLE) ×1 IMPLANT
SUCTION POOLE HANDLE (INSTRUMENTS)
SUT ETHILON 2 0 FS 18 (SUTURE) ×2 IMPLANT
SUT MNCRL AB 4-0 PS2 18 (SUTURE) ×2 IMPLANT
SUT NOVA 1 T20/GS 25DT (SUTURE) ×5 IMPLANT
SUT SILK 2 0 SH CR/8 (SUTURE) IMPLANT
SUT SILK 3 0 SH CR/8 (SUTURE) ×2 IMPLANT
SYR 20ML LL LF (SYRINGE) ×2 IMPLANT
SYR CONTROL 10ML LL (SYRINGE) ×2 IMPLANT
SYRINGE TOOMEY DISP (SYRINGE) ×2 IMPLANT
TOWEL GREEN STERILE (TOWEL DISPOSABLE) ×2 IMPLANT
TOWEL GREEN STERILE FF (TOWEL DISPOSABLE) ×2 IMPLANT
TRAY FOLEY MTR SLVR 16FR STAT (SET/KITS/TRAYS/PACK) IMPLANT
TUBE GASTROSTOMY 18F (CATHETERS) ×1 IMPLANT
TUBE MOSS GAS 18FR (TUBING) IMPLANT

## 2019-09-06 NOTE — Progress Notes (Signed)
Occupational Therapy Note  Patient Details  Name: Andres [name] MRN: [id number] Date of Birth: 12/28/1940  Today's Date: 09/06/2019 OT Missed Time: 70 Minutes Missed Time Reason: Unavailable (comment);Other (comment)  Pt gone and then just returning from procedure for g- tube. Missed 91min - pt asleep.   Willeen Cass [facility] 09/06/2019, 3:45 PM

## 2019-09-06 NOTE — Transfer of Care (Signed)
Immediate Anesthesia Transfer of Care Note  Patient: Andres [name]  Procedure(s) Performed: INSERTION OF GASTROSTOMY TUBE (N/A Abdomen)  Patient Location: PACU  Anesthesia Type:General  Level of Consciousness: awake, drowsy and confused  Airway & Oxygen Therapy: Patient Spontanous Breathing and Patient connected to face mask oxygen  Post-op Assessment: Report given to RN and Post -op Vital signs reviewed and stable  Post vital signs: Reviewed and stable  Last Vitals:  Vitals Value Taken Time  BP 177/65 09/06/19 1233  Temp    Pulse 91 09/06/19 1234  Resp 22 09/06/19 1234  SpO2 94 % 09/06/19 1234  Vitals shown include unvalidated device data.  Last Pain:  Vitals:   09/06/19 1233  TempSrc:   PainSc: (P) Asleep      Patients Stated Pain Goal: 3 ([id number])  Complications: No apparent anesthesia complications

## 2019-09-06 NOTE — Progress Notes (Addendum)
Physical Therapy Session Note  Patient Details  Name: Andres [name] MRN: [id number] Date of Birth: 03-Dec-1940  Today's Date: 09/06/2019 PT Individual Time: 0803-0859 PT Individual Time Calculation (min): 56 min   Short Term Goals: Week 3:  PT Short Term Goal 1 (Week 3): Pt will maintain sitting balance EOB with mod assist PT Short Term Goal 2 (Week 3): Pt will perform bed mobilty with max assist of 1 person consistently PT Short Term Goal 3 (Week 3): Pt will participate in reaching tasks with the R UE and initiate movement with only moderate assist 25% of the time  Skilled Therapeutic Interventions/Progress Updates: Tx1:Pt presented in bed awake. Pt rolled to L modA due to pt's restlessness. PTA moved legs off of bed total A due to pt resisting movement. MaxA x 1 supine to sit with heavy posterior pushing initially. Pt unable to correct lean without maxA and PTA placing RUE on lap. Pt at best became minA due to restlessness. Pt leaned to L maxA for [place] board placement and performed transfer to TIS maxA x 2. Pt transferred to ortho gym and participated in standing frame. Pt initally required maxA for forward lean as pt continues to not follow commands and becomes resistive with HOH assist. In standing frame PTA played music with pt noted to intermittently stand upright and use BUE to improve posture. Pt also noted to actively extend LLE and activate glutes to improve posture however was unable to sustain for more than a few seconds. Pt tolerated standing frame for approx 12 minutes before becoming notably fatigued. Pt returned to chair and transported to day room with window to L of pt. PTA was able to perform cx rotation to L slightly past midline without resistance. Pt left at nsg station at end of session with lap tray in place and nsg aware.   Tx2: Pt missed 45 min skilled PT due to lethargy. Pt had PEG tube placement recently and continues to demonstrate significant lethargy from procedure.       Therapy Documentation Precautions:  Precautions Precautions: Fall Precaution Comments: Lt hemi Restrictions Weight Bearing Restrictions: No General: PT Amount of Missed Time (min): 45 Minutes PT Missed Treatment Reason: Patient fatigue Vital Signs: Therapy Vitals Temp: (!) 96.8 F (36 C) Pulse Rate: 98 Resp: 18 BP: (!) 167/102 Patient Position (if appropriate): Lying Oxygen Therapy SpO2: 99 % O2 Device: Room Air Pulse Oximetry Type: Continuous Pain: Pain Assessment Pain Scale: 0-10 Pain Score: Asleep Faces Pain Scale: No hurt PAINAD (Pain Assessment in Advanced Dementia) Breathing: normal Negative Vocalization: none Facial Expression: smiling or inexpressive Body Language: relaxed Consolability: no need to console PAINAD Score: 0    Therapy/Group: Individual Therapy  Susann Lawhorne  Rhett Mutschler, PTA  09/06/2019, 3:11 PM

## 2019-09-06 NOTE — Progress Notes (Signed)
Patient received from PACU.  Arouses to voice but drowsy.  G-tube noted to be draining to gravity with little to no drainage in the bag as of this moment.  Surgical site dressing with some bright red blood noted, will reinforce dressing if needed.  Abdominal binder in place.  Will update primary nurse of arrival.  Brita Romp, RN

## 2019-09-06 NOTE — Progress Notes (Signed)
Pt missed 30 minutes skilled ST due to being off the unit for PEG placement.   Jettie Booze, CF-SLP

## 2019-09-06 NOTE — Anesthesia Postprocedure Evaluation (Signed)
Anesthesia Post Note  Patient: Andres [name]  Procedure(s) Performed: INSERTION OF GASTROSTOMY TUBE (N/A Abdomen)     Patient location during evaluation: PACU Anesthesia Type: General Level of consciousness: awake Pain management: pain level controlled Vital Signs Assessment: post-procedure vital signs reviewed and stable Respiratory status: spontaneous breathing Cardiovascular status: stable Postop Assessment: no backache Anesthetic complications: no    Last Vitals:  Vitals:   09/06/19 1432 09/06/19 1442  BP: (!) 167/102   Pulse: 98   Resp: 18   Temp: (!) 35.8 C (!) 36 C  SpO2: 99%     Last Pain:  Vitals:   09/06/19 1300  TempSrc:   PainSc: Asleep                 Laron Boorman

## 2019-09-06 NOTE — Progress Notes (Signed)
Patient sleeping and only is agitated when wakes up.  He is NPO and ready for his surgery today.  Verbal consent has been obtained on his consent form by his daughter.  I have also called her to inform her of timing of his surgery today.  We will call her when surgery is complete.  Andres [name] 7:51 AM 09/06/2019

## 2019-09-06 NOTE — Anesthesia Preprocedure Evaluation (Signed)
Anesthesia Evaluation  Patient identified by MRN, date of birth, ID band Patient confused    History of Anesthesia Complications (+) PONV  Airway Mallampati: II  TM Distance: >3 FB     Dental   Pulmonary shortness of breath, pneumonia, former smoker,    breath sounds clear to auscultation       Cardiovascular hypertension, + CAD   Rhythm:Regular Rate:Normal     Neuro/Psych Seizures -,   Neuromuscular disease CVA    GI/Hepatic hiatal hernia, GERD  ,(+) Hepatitis -  Endo/Other  diabetes  Renal/GU Renal disease     Musculoskeletal  (+) Arthritis ,   Abdominal   Peds  Hematology  (+) anemia ,   Anesthesia Other Findings   Reproductive/Obstetrics                             Anesthesia Physical Anesthesia Plan  ASA: III  Anesthesia Plan: General   Post-op Pain Management:    Induction: Intravenous  PONV Risk Score and Plan: 3 and Ondansetron and Dexamethasone  Airway Management Planned: Oral ETT  Additional Equipment:   Intra-op Plan:   Post-operative Plan: Possible Post-op intubation/ventilation  Informed Consent: I have reviewed the patients History and Physical, chart, labs and discussed the procedure including the risks, benefits and alternatives for the proposed anesthesia with the patient or authorized representative who has indicated his/her understanding and acceptance.     Dental advisory given  Plan Discussed with: CRNA and Anesthesiologist  Anesthesia Plan Comments:         Anesthesia Quick Evaluation

## 2019-09-06 NOTE — Progress Notes (Signed)
Beta- blocker held am of surgery due to need for medication to be given by cortrak tube and required volume associated with administering medication via cortrak tube. Dr. Nyoka Cowden made aware of same.

## 2019-09-06 NOTE — Progress Notes (Signed)
[facility] PHYSICAL MEDICINE & REHABILITATION PROGRESS NOTE  Subjective/Complaints:    ROS: Limited due to cognitive/behavioral     Objective: Vital Signs: Blood pressure 126/79, pulse 83, temperature 98.5 F (36.9 C), resp. rate 18, height 5\' 7"  (1.702 m), weight 90.4 kg, SpO2 100 %. No results found. No results for input(s): WBC, HGB, HCT, PLT in the last 72 hours. No results for input(s): NA, K, CL, CO2, GLUCOSE, BUN, CREATININE, CALCIUM in the last 72 hours.  Physical Exam: BP 126/79 (BP Location: Right Arm)   Pulse 83   Temp 98.5 F (36.9 C)   Resp 18   Ht 5\' 7"  (1.702 m)   Wt 90.4 kg   SpO2 100%   BMI 31.21 kg/m  Constitutional: No distress . Vital signs reviewed. Restless, eyes open, nonverbal HEENT: EOMI, oral membranes moist Neck: supple Cardiovascular: RRR without murmur. No JVD    Respiratory: CTA Bilaterally without wheezes or rales. Normal effort    GI: BS +, non-tender, non-distended   Skin: Warm and dry.  Intact. Psych: slow to arouse, arouses to verbal and tactile stim  Musc: Lower extremity edema. Neurological:  Alert makes eye contact, distracted Dysarthria/anarthric Motor: 3- Left shouder , hip flexor and knee ext, (spont movement not to command )--no changes Sensory no withdrawal to pinch  Severe Left neglect without change  Assessment/Plan: 1. Functional deficits secondary to right MCA infarct which require 3+ hours per day of interdisciplinary therapy in a comprehensive inpatient rehab setting.  Physiatrist is providing close team supervision and 24 hour management of active medical problems listed below.  Physiatrist and rehab team continue to assess barriers to discharge/monitor patient progress toward functional and medical goals  Care Tool:  Bathing        Body parts bathed by helper: (whole body)     Bathing assist Assist Level: 2 Helpers     Upper Body Dressing/Undressing Upper body dressing   What is the patient wearing?:  Hospital gown only    Upper body assist Assist Level: Dependent - Patient 0%    Lower Body Dressing/Undressing Lower body dressing      What is the patient wearing?: Incontinence brief     Lower body assist Assist for lower body dressing: 2 Helpers     Toileting Toileting    Toileting assist Assist for toileting: 2 Helpers     Transfers Chair/bed transfer  Transfers assist     Chair/bed transfer assist level: Dependent - mechanical lift     Locomotion Ambulation   Ambulation assist   Ambulation activity did not occur: Safety/medical concerns          Walk 10 feet activity   Assist  Walk 10 feet activity did not occur: Safety/medical concerns        Walk 50 feet activity   Assist Walk 50 feet with 2 turns activity did not occur: Safety/medical concerns         Walk 150 feet activity   Assist Walk 150 feet activity did not occur: Safety/medical concerns         Walk 10 feet on uneven surface  activity   Assist Walk 10 feet on uneven surfaces activity did not occur: Safety/medical concerns         Wheelchair     Assist   Type of Wheelchair: Manual    Wheelchair assist level: Dependent - Patient 0% Max wheelchair distance: 150    Wheelchair 50 feet with 2 turns activity    Assist  Assist Level: Dependent - Patient 0%   Wheelchair 150 feet activity     Assist     Assist Level: Dependent - Patient 0%      Medical Problem List and Plan: 1.  Left side weakness with dysarthria and dysphagia secondary to acute right MCA infarction  Continue CIR PT,  OT ,SLP   Plan is for Gastrostomy vs Jejunostomy followed by SNF , team conf in am   Prognosis guarded due to size of R MCA infarct, basically entire MCA territory   2.  Antithrombotics: -DVT/anticoagulation: Lovenox             -antiplatelet therapy: Aspirin 81 mg daily, Plavix 75 mg daily x3 months then Plavix alone 3. Pain Management: Voltaren gel as  needed 4. Mood: Provide emotional support             -antipsychotic agents: N/A 5. Neuropsych: This patient is not capable of making decisions on his own behalf. 6. Skin/Wound Care: Routine skin checks 7. Fluids/Electrolytes/Nutrition: Routine in and outs.   cognition and pharyngeal dysphagia necessitate NPO status - cont TF for now but will not be able to continue this out of the hospital  - Patient's daughter canceled the open gastrostomy for Friday .  She states she has some other emergency.  She would like to have the procedure done on Tuesday.  We discussed that given his recent stroke he would be at increased risk to have another stroke under anesthesia she understands this.  We discussed that if he did not have a gastrostomy placed, palliative care consult would be needed to establish goals of care. 8.  Post stroke dysphagia.        N.p.o.             Nasogastric tube feeds held for surgery- if J tube is placed all meds must be liquid (not crushed)               Advance diet when more alert/able to tolerate 9.  Seizure disorder.  after discussion with daughter , reduced  Keppra  500mg  BID to reduce lethargy no seizures  EEG negative  No seizures since admission to rehab 10.  Essential hypertension.  Lopressor 25 mg twice daily   Vitals:   09/05/19 2006 09/06/19 0501  BP: (!) 147/109 126/79  Pulse: 85 83  Resp: 16 18  Temp: 97.6 F (36.4 C) 98.5 F (36.9 C)  SpO2:  100%   1/30 BP severely elevated. Will add amlodipine 5mg  daily.  1/31: Much better controlled. Lability noted  11.  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  Hemoglobin A1c 6.2. CBG (last 3)  Recent Labs    09/05/19 2354 09/06/19 0402 09/06/19 0747  GLUCAP 182* 167* 162*   Good  control 12.  CKD stage III.  Nephrology feels BUN/Creat elevation is due to inadequate free13.  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  Latest blood cultures question contaminant per infectious disease.   Ancef completed 08/19/2019.- afebrile              Repeat blood culture no growth 14.  CAD with history of CABG 1997.  Continue aspirin and Plavix 15.  Hyperlipidemia: Continue Lipitor 16.  BPH.  Flomax 0.4 mg daily.               incont with low residuals  17.  Morbid obesity.  BMI 35.70.  Dietary follow-up 18.  Acute blood loss anemia Resolved   Hemoglobin 12.3 on  1/14, 14.7 on 1/18  19.  Lethargy may be sleep wake cycle related, unremarkable CBC, BMET, CXR  neg start ritalin trial   Ritalin helpful for alertness 5mg =10mg , no improvement in function however,reduced to 5 mg, will change to am only   Continue sleep chart 20. Family support: family involved, unable to provide 24/7 care post d/c  LOS: 18 days A FACE TO [place] E Ginni Eichler 09/06/2019, 7:54 AM

## 2019-09-06 NOTE — Op Note (Signed)
Preop diagnosis: Status post right MCA infarct with significant residual hemiparesis: Need for enteral feeding access   Postop diagnosis: Same Procedure performed: Open gastrostomy tube placement Surgeon:Dailey Buccheri K Nyrie Sigal Assistant: Saverio Danker, PA-C Anesthesia: General Indications: This is an 79 year old male who had an acute right MCA infarct on 08/09/1939.  He was transferred to rehab on 1/15.  He has continued severe neurologic deficits and the required feeding via nasoenteric tube.  He is not a candidate for percutaneous endoscopic gastrostomy because of a hiatal hernia and his anatomy is visualized on CT scan.  Description of procedure: Patient is brought to the operating room placed in the supine position on the operating table.  After adequate level of general anesthesia was obtained his abdomen was prepped with ChloraPrep and draped sterile fashion.  A timeout was taken to ensure the proper patient and proper procedure.  I made a vertical midline incision between the xiphoid and umbilicus.  Dissection was carried down to the fascia.  The patient has a very thick layer of adipose tissue.  We entered the peritoneal cavity bluntly.  I opened the incision widely and placed a Balfour retractor.  I was able to identify the stomach.  The greater curvature of the stomach to reach well below the left costal margin with no undue tension.  I selected an insertion site 3 fingerbreadths below the left costal margin in the mid clavicular line.  We tunneled an 92 French gastrostomy tube into the abdomen.  I placed a pursestring suture of 3-0 silk in the anterior surface of the stomach proximal to the antrum.  A gastrotomy was created and we inserted the gastrostomy tube.  The balloon was inflated.  The pursestring suture was tied down.  We brought the stomach up to the abdominal wall.  We created a gastropexy with 3 interrupted 3-0 silk sutures.  The outer flange of the gastrostomy tube was secured with 3-0 nylon  sutures.  The tube irrigated easily and were able to aspirate gastric contents.  We then inspected for hemostasis.  The fascia was reapproximated with multiple interrupted figure-of-eight #1 Novafil sutures.  The subcutaneous tissues were irrigated.  Skin was closed with 4-0 Monocryl.  Steri-Strips and clean dressings were applied.  Patient was then extubated and brought to the recovery room in stable condition.  All sponge, instrument, and needle counts are correct.  Andres [name]. [name], MD, [facility]  General/ Trauma Surgery   09/06/2019 12:28 PM

## 2019-09-06 NOTE — Anesthesia Procedure Notes (Signed)
Procedure Name: Intubation Date/Time: 09/06/2019 11:05 AM Performed by: Trinna Post., CRNA Pre-anesthesia Checklist: Patient identified, Emergency Drugs available, Suction available, Patient being monitored and Timeout performed Patient Re-evaluated:Patient Re-evaluated prior to induction Oxygen Delivery Method: Circle system utilized Preoxygenation: Pre-oxygenation with 100% oxygen Induction Type: IV induction and Rapid sequence Ventilation: Mask ventilation without difficulty and Oral airway inserted - appropriate to patient size Laryngoscope Size: Mac and 4 Grade View: Grade I Tube type: Oral Tube size: 7.5 mm Number of attempts: 1 Airway Equipment and Method: Stylet Placement Confirmation: ETT inserted through vocal cords under direct vision,  positive ETCO2 and breath sounds checked- equal and bilateral Secured at: 23 cm Tube secured with: Tape Dental Injury: Teeth and Oropharynx as per pre-operative assessment

## 2019-09-07 ENCOUNTER — Inpatient Hospital Stay (HOSPITAL_COMMUNITY): Payer: Medicare (Managed Care) | Admitting: Physical Therapy

## 2019-09-07 ENCOUNTER — Inpatient Hospital Stay (HOSPITAL_COMMUNITY): Payer: Medicare (Managed Care) | Admitting: Speech Pathology

## 2019-09-07 ENCOUNTER — Inpatient Hospital Stay (HOSPITAL_COMMUNITY): Payer: Medicare (Managed Care) | Admitting: Occupational Therapy

## 2019-09-07 ENCOUNTER — Inpatient Hospital Stay (HOSPITAL_COMMUNITY): Payer: Medicare (Managed Care)

## 2019-09-07 LAB — GLUCOSE, CAPILLARY
Glucose-Capillary: 146 mg/dL — ABNORMAL HIGH (ref 70–99)
Glucose-Capillary: 154 mg/dL — ABNORMAL HIGH (ref 70–99)
Glucose-Capillary: 160 mg/dL — ABNORMAL HIGH (ref 70–99)
Glucose-Capillary: 205 mg/dL — ABNORMAL HIGH (ref 70–99)
Glucose-Capillary: 271 mg/dL — ABNORMAL HIGH (ref 70–99)
Glucose-Capillary: 278 mg/dL — ABNORMAL HIGH (ref 70–99)

## 2019-09-07 LAB — PHOSPHORUS: Phosphorus: 5.2 mg/dL — ABNORMAL HIGH (ref 2.5–4.6)

## 2019-09-07 LAB — MAGNESIUM: Magnesium: 2.6 mg/dL — ABNORMAL HIGH (ref 1.7–2.4)

## 2019-09-07 MED ORDER — LEVETIRACETAM 100 MG/ML PO SOLN
750.0000 mg | Freq: Two times a day (BID) | ORAL | Status: DC
  Administered 2019-09-07 – 2019-09-09 (×4): 750 mg
  Filled 2019-09-07 (×5): qty 10

## 2019-09-07 MED ORDER — LEVETIRACETAM 100 MG/ML PO SOLN
250.0000 mg | Freq: Once | ORAL | Status: AC
  Administered 2019-09-07: 250 mg via ORAL

## 2019-09-07 MED ORDER — OSMOLITE 1.5 CAL PO LIQD
1000.0000 mL | ORAL | Status: DC
  Administered 2019-09-07: 1000 mL
  Filled 2019-09-07 (×2): qty 1000

## 2019-09-07 NOTE — Progress Notes (Signed)
Physical Therapy Session Note  Patient Details  Name: Andres [name] MRN: [id number] Date of Birth: Dec 13, 1940  Today's Date: 09/07/2019 PT Individual Time: 0805-0900 and 1030-1115 PT Individual Time Calculation (min): 55 min   Short Term Goals: Week 3:  PT Short Term Goal 1 (Week 3): Pt will maintain sitting balance EOB with mod assist PT Short Term Goal 2 (Week 3): Pt will perform bed mobilty with max assist of 1 person consistently PT Short Term Goal 3 (Week 3): Pt will participate in reaching tasks with the R UE and initiate movement with only moderate assist 25% of the time  Skilled Therapeutic Interventions/Progress Updates: Pt presented in bed with nsg present. Pt noted to be incontinent of B&B. Performed rolling L/R maxA x 2 with HOH assist to reach for bed rails. Performed peri-care total A. Once completed PTA performed cervical ROM with gentle stretch to L attempting to minimize pt's resistance with stretching. Pt noted to actively turn head to L this session but continues to not respond to stimuli on L. Performed supine to sit maxA x 2 this session due to increased resistance and heavy posterior pushing. Pt also notably groaning during transition to sitting ?pain 2/2 PEG tube placement. Pt initially with heavy posteriorlateral lean to L. Pt sat at EOB approx 6 min with MD arriving and performing daily assessment with pt improving sitting balance with bouts of minA to near CGA. Pt transferred via [place] board to TIS with maxA x 2 requiring total A to lean to L to allow for board placement. Pt transported to rehab gym and participated in STS in parallel bars x 2. Pt required maxA x 2 to initiate standing however once in standing pt was able to stand with minA for bouts up to 30 seconds. Pt notably fatigued after standing.  Attempted remainder of session to have pt tend to L side and attempt to initiate movement. Pt would intermittently turn head to L however would not initiate any reaching with  L nor R UE. Pt left at nsg station with lap tray in place.    Tx2; Pt in TIS at nsg station with head hanging off of headrest. PTA was able to readjust pt's positioning and took pt ortho gym. Pt participated in standing frame x 8 min with PTA providing maxA for forward lean for sling placement. Pt demonstrated slightly improved posture this session for longer bouts however continued to require total A for head positioning and pt unable to initiate any tasks or follow commands. Pt became notably fatigued at end of      Therapy Documentation Precautions:  Precautions Precautions: Fall Precaution Comments: Lt hemi Restrictions Weight Bearing Restrictions: No General:   Vital Signs:  Pain: Pain Assessment Pain Scale: Faces Faces Pain Scale: Hurts a little bit Pain Location: Other (Comment)(unable to specify) Pain Descriptors / Indicators: Restless Pain Onset: On-going Pain Intervention(s): RN made aware;Other (Comment)(rn had already medicated)    Therapy/Group: Individual Therapy  Ryan Palermo  Vern Guerette, PTA  09/07/2019, 4:45 PM

## 2019-09-07 NOTE — Progress Notes (Signed)
Patient ID: Andres [name], male   DOB: Feb 10, 1941, 79 y.o.   MRN: [id number]   Discussed insurance has approved the patient's stay through 09/09/19. Daughter is aware that the patient is +2 assist for care and reviewed goals had been set at Moderate assist however goals have been downgraded to Maximum assist overall. The patient continues to have lethargy and poor tolerance, initiation and, fluctuating functional status. Daughter noted she would like to have patient go to a SNF in [place]. Discussed process for communication and alert transportation service of the potential for discharge at the end of the week, pending a bed offer/availability at the facility she has requested. The daughter noted an understanding of the information reviewed and the SNF placement process was initiated. Faxed information for SNF referral to [facility] in [place] per her request.

## 2019-09-07 NOTE — Progress Notes (Signed)
Nutrition Follow-up  RD working remotely.   DOCUMENTATION CODES:   Obesity unspecified  INTERVENTION:   Initiate TF via G-tube with Osmolite at goal rate of 70 ml/h (1400 ml per day) run for 20 hours per day (TF may be held for up to 4 hours for therapies)   35mL Pro-stat BID  256mL free water flushes Q4 (MD/PA to adjust)  Tube feeding regimen with free water flushes will provide 2300 kcals, 117 gm protein, 2326  ml free water daily.  RD will order bolus tube feedings tomorrow (2/4) as per conversation with MD.    NUTRITION DIAGNOSIS:   Inadequate oral intake related to inability to eat, lethargy/confusion as evidenced by NPO status.  Ongoing.  GOAL:   Patient will meet greater than or equal to 90% of their needs  Progressing.   MONITOR:   TF tolerance, Labs, Weight trends, I & O's  REASON FOR ASSESSMENT:   Consult Enteral/tube feeding initiation and management  ASSESSMENT:   79 year old right-handed male with history of hypertension, CKD stage III, morbid obesity with BMI 35.70, hyperlipidemia, history of seizure maintained on Keppra, CAD status post CABG, history of CVA maintained on aspirin 81 mg daily and Plavix, carotid artery stenosis, mitral valve prolapse, diet-controlled diabetes mellitus, BPH and remote tobacco abuse. Admitted to rehab, s/p acute rt MCA infarct surroudning of R MCA parietal infarct, d/t right M1 occulusion.  01/08 - Cortrak placed 02/02 - Cortrak removed, G-tube placed Medications reviewed and include: Pro-stat BID, SSI  Per MD, tube feeding should run continuously via feeding pump and may be switched to bolus feeding if tolerated for 24 hours. Pt expected to d/c on 2/5.   Pt's wt stable since last RD assessment.   Labs reviewed: BUN 57 (H), Creatinine 1.55(H) CBGs 139-160  I/O: +2,048mL since 08/24/19  NUTRITION - FOCUSED PHYSICAL EXAM:  Deferred; RD working remotely.   Diet Order:   Diet Order            Diet NPO time  specified  Diet effective midnight              EDUCATION NEEDS:   No education needs have been identified at this time  Skin:  Skin Assessment: Reviewed RN Assessment  Last BM:  1/26  Height:   Ht Readings from Last 1 Encounters:  09/02/19 5\' 7"  (1.702 m)    Weight:   Wt Readings from Last 1 Encounters:  09/07/19 90.7 kg    Ideal Body Weight:  67.27 kg  BMI:  Body mass index is 31.32 kg/m.  Estimated Nutritional Needs:   Kcal:  [id number]  Protein:  105-120 g  Fluid:  2L/d   Larkin Ina, MS, RD, LDN Pager: [phone] Weekend/After Hours Pager: [phone]

## 2019-09-07 NOTE — Progress Notes (Signed)
    1 Day Post-Op  Subjective: Patient awake but does not interact with me.  Does not seem to hurt but difficult to tell  ROS: unable  Objective: Vital signs in last 24 hours: Temp:  [96.4 F (35.8 C)-98.2 F (36.8 C)] 98.2 F (36.8 C) (02/03 0408) Pulse Rate:  [64-98] 64 (02/03 0408) Resp:  [18-23] 18 (02/03 0408) BP: (119-177)/(65-104) 119/96 (02/03 0408) SpO2:  [90 %-99 %] 96 % (02/03 0408) Weight:  [90.7 kg] 90.7 kg (02/03 0408) Last BM Date: 09/03/19  Intake/Output from previous day: 02/02 0701 - 02/03 0700 In: 1100 [I.V.:600; IV Piggyback:500] Out: 15 [Blood:15] Intake/Output this shift: No intake/output data recorded.  PE: Abd: soft, doesn't guard or seem tender to palpation, g-tube in place, clean and intact, no drainage noted.  Midline incision is also intact.  Some saturation as expected on gauze.  Will change this to a clean bandage.  Lab Results:  No results for input(s): WBC, HGB, HCT, PLT in the last 72 hours. BMET No results for input(s): NA, K, CL, CO2, GLUCOSE, BUN, CREATININE, CALCIUM in the last 72 hours. PT/INR No results for input(s): LABPROT, INR in the last 72 hours. CMP     Component Value Date/Time   NA 145 09/03/2019 0739   K 4.6 09/03/2019 0739   CL 101 09/03/2019 0739   CO2 33 (H) 09/03/2019 0739   GLUCOSE 180 (H) 09/03/2019 0739   BUN 57 (H) 09/03/2019 0739   CREATININE 1.55 (H) 09/03/2019 0739   CREATININE 1.21 (H) 03/12/2015 0838   CALCIUM 10.3 09/03/2019 0739   PROT 6.6 08/22/2019 0606   ALBUMIN 3.2 (L) 08/22/2019 0606   AST 39 08/22/2019 0606   ALT 21 08/22/2019 0606   ALKPHOS 71 08/22/2019 0606   BILITOT 0.3 08/22/2019 0606   GFRNONAA 42 (L) 09/03/2019 0739   GFRAA 48 (L) 09/03/2019 0739   Lipase  No results found for: LIPASE     Studies/Results: No results found.  Anti-infectives: Anti-infectives (From admission, onward)   Start     Dose/Rate Route Frequency Ordered Stop   09/06/19 0600  ceFAZolin (ANCEF) IVPB  2g/100 mL premix     2 g 200 mL/hr over 30 Minutes Intravenous On call to O.R. 09/05/19 0907 09/06/19 1136       Assessment/Plan CVA  POD 1, s/p open g-tube placement for dysphagia  -leave steri-strips on for about a week, may change outer dressing prn saturation -may use Tube for meds and TFs today as per primary service -no further surgical needs at this time.  If he ever needs his tube removed he can follow up with [facility] for removal.  FEN - NPO, TFs ok to start today from our standpoint VTE - per primary ID - none  We will sign off.  Please call us back if you have any questions or concerns.  Discussed patient with Dr. Letta Pate today.   LOS: 19 days    [name] , [facility] 09/07/2019, 8:27 AM Please see Amion for pager number during day hours 7:00am-4:30pm or 7:00am -11:30am on weekends

## 2019-09-07 NOTE — Discharge Summary (Signed)
Physician Discharge Summary  Patient ID: Andres [name] MRN: [id number] DOB/AGE: [birth year] 81 y.o.  Admit date: 08/19/2019 Discharge date: 09/09/2019  Discharge Diagnoses:  Principal Problem:   Right middle cerebral artery stroke [facility]) Active Problems:   Diabetes mellitus type 2 in obese ([place])   Acute blood loss anemia   Labile blood pressure DVT prophylaxis Pain management Dysphagia.  Status post G-tube placement general surgery 09/06/2019 Seizure disorder CAD with CABG Morbid obesity with BMI 35.70 CKD stage III Mitral valve prolapse Hyperlipidemia Pneumonia Obesity  Discharged Condition: Guarded  Significant Diagnostic Studies: CT ABDOMEN WO CONTRAST  Result Date: 08/31/2019 CLINICAL DATA:  Cerebral infarction and assessment for potential gastrostomy tube placement. EXAM: CT ABDOMEN WITHOUT CONTRAST TECHNIQUE: Multidetector CT imaging of the abdomen was performed following the standard protocol without IV contrast. COMPARISON:  CT of the abdomen and pelvis on 07/21/2013 FINDINGS: Lower chest: Mild ground-glass opacity in the posterior left lower lobe and medial right lower lobe may be related to atelectasis or subtle infiltrate. Early pneumonia/atypical infection is not excluded. Hepatobiliary: No focal liver abnormality is seen. No gallstones, gallbladder wall thickening, or biliary dilatation. Pancreas: Unremarkable. No pancreatic ductal dilatation or surrounding inflammatory changes. Spleen: Normal in size without focal abnormality. Adrenals/Urinary Tract: Adrenal glands are unremarkable. Kidneys are normal, without renal calculi, focal lesion, or hydronephrosis. Stomach/Bowel: Moderate-sized hiatal hernia. Feeding tube extends to the level of the mid to distal stomach. Below the diaphragm, the rest of the stomach is shielded almost entirely by the left lobe of the liver and the splenic flexure of the colon also abuts the left lobe liver tip. This anatomy is highly unfavorable to  gastrostomy tube placement percutaneously. The rest of the visualized abdominal bowel loops demonstrate no evidence of obstruction or ileus. No free air is identified. No inflammatory process is seen. Vascular/Lymphatic: No significant vascular findings are present. No enlarged abdominal lymph nodes. Other: No ascites, anasarca or focal fluid collections. Musculoskeletal: No acute or significant osseous findings. IMPRESSION: 1. Unfavorable anatomy for percutaneous gastrostomy tube placement. There is a moderate-sized hiatal hernia and the rest of the stomach is shielded by the left lobe of the liver. There also is encroachment of the splenic flexure of the colon abutting the tip of the left lobe of the liver. If the patient is in need for long-term enteral nutrition, consider surgical consultation for either surgical gastrostomy or jejunostomy. 2. Mild ground-glass opacity in both lower lobes at the visualized lung bases. Although this may be on the basis of atelectasis, subtle infection including atypical viral infection is not excluded. Electronically Signed   By: Aletta Edouard M.D.   On: 08/31/2019 13:11   DG Chest 1 View  Result Date: 08/23/2019 CLINICAL DATA:  Lethargy. EXAM: CHEST  1 VIEW COMPARISON:  August 14, 2019 FINDINGS: A nasogastric tube is seen with its distal end extending below the level of the diaphragm. Multiple sternal wires and vascular clips are seen. There is no evidence of acute infiltrate, pleural effusion or pneumothorax. The heart size and mediastinal contours are within normal limits. The visualized skeletal structures are unremarkable. IMPRESSION: 1. Evidence of prior median sternotomy/CABG. 2. No acute or active cardiopulmonary disease. Electronically Signed   By: Virgina Norfolk M.D.   On: 08/23/2019 19:15   DG Chest 2 View  Result Date: 08/09/2019 CLINICAL DATA:  Stroke workup EXAM: CHEST - 2 VIEW COMPARISON:  09/05/2018 FINDINGS: Interstitial coarsening at the bases  similar to prior. This is accentuated from prior in the setting  of low lung volumes. Prior CABG with stable heart size. No evidence of effusion or pneumothorax. IMPRESSION: Low volume chest with mild atelectatic type opacity at the bases. Electronically Signed   By: Monte Fantasia M.D.   On: 08/09/2019 08:45   CT HEAD WO CONTRAST  Result Date: 08/26/2019 CLINICAL DATA:  Cerebral hemorrhage suspected. Additional history provided: Left-sided weakness with dysarthria and dysphagia secondary to acute right MCA infarction. EXAM: CT HEAD WITHOUT CONTRAST TECHNIQUE: Contiguous axial images were obtained from the base of the skull through the vertex without intravenous contrast. COMPARISON:  Head CT 08/14/2019 FINDINGS: Brain: Again demonstrated is a large right MCA vascular territory subacute infarct. Portions of the infarcted territory demonstrate fogging on the current examination. There has been a significant interval decrease in associated swelling and mass effect. No evidence of hemorrhagic conversion. No new demarcated infarct is identified. No midline shift, hydrocephalus or extra-axial fluid collection. Background chronic small vessel ischemic disease, unchanged. Mild generalized parenchymal atrophy. Vascular: No definite hyperdense vessel. However, a short segment occlusion was demonstrated at the right M1/M2 junction on prior CTA head/neck 08/09/2019. Skull: Normal. Negative for fracture or focal lesion. Sinuses/Orbits: Visualized orbits demonstrate no acute abnormality. No significant paranasal sinus disease or mastoid effusion at the imaged levels. Other: Redemonstrated right suboccipital lipoma. 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 No evidence of hemorrhagic conversion. Significant interval decrease in associated mass effect as compared to 08/14/2019. No new demarcated infarct is identified. Stable background generalized parenchymal atrophy and chronic small vessel ischemic  disease. Electronically Signed   By: Kellie Simmering DO   On: 08/26/2019 13:35   CT HEAD WO CONTRAST  Result Date: 08/14/2019 CLINICAL DATA:  Stroke follow-up. Worsening stroke symptoms. EXAM: CT HEAD WITHOUT CONTRAST TECHNIQUE: Contiguous axial images were obtained from the base of the skull through the vertex without intravenous contrast. COMPARISON:  08/09/2019 FINDINGS: Brain: Slight expansion of right MCA territory infarct, now involving more of the insula and frontal operculum. There is white matter hypoattenuation consistent with chronic ischemic microangiopathy. No hemorrhage. No herniation or other significant mass effect. No hydrocephalus. Vascular: Atherosclerotic calcification of the internal carotid arteries at the skull base. No abnormal hyperdensity of the major intracranial arteries or dural venous sinuses. Skull: The visualized skull base, calvarium and extracranial soft tissues are normal. Sinuses/Orbits: No fluid levels or advanced mucosal thickening of the visualized paranasal sinuses. No mastoid or middle ear effusion. The orbits are normal. Other: None. IMPRESSION: Progression of right MCA territory infarct, extending further into the insula and frontal operculum without hemorrhage or herniation. Electronically Signed   By: Ulyses Jarred M.D.   On: 08/14/2019 02:16   MR BRAIN WO CONTRAST  Result Date: 08/09/2019 CLINICAL DATA:  Stroke EXAM: MRI HEAD WITHOUT CONTRAST TECHNIQUE: Multiplanar, multiecho pulse sequences of the brain and surrounding structures were obtained without intravenous contrast. COMPARISON:  MRI head 09/06/2018. CT perfusion and CTA head 08/09/2019 FINDINGS: Brain: Chronic infarct right parietal lobe. Large area of acute infarct surrounding the chronic infarct. Acute infarct in the right posterior temporal lobe extending into the right frontal and parietal lobe. Acute infarct right insula. Sparing of the basal ganglia. Acute infarct is primarily cortical in location with  minimal white matter component. Mild hemorrhage within the chronic infarct as noted previously. Ventricle size normal. No midline shift. Chronic microvascular ischemic changes throughout the white matter. Vascular: Normal arterial flow voids at the base of brain. Skull and upper cervical spine: Negative. Lipoma in the right posterior occipital  scalp. Sinuses/Orbits: Mucosal edema paranasal sinuses with air-fluid level left maxillary sinus. Left cataract surgery. Other: Motion degraded study. IMPRESSION: Large area of acute infarct right MCA territory surrounding the chronic infarct in the right parietal lobe. Moderate chronic microvascular ischemic changes. Motion degraded study. Electronically Signed   By: Franchot Gallo M.D.   On: 08/09/2019 14:12   DG CHEST PORT 1 VIEW  Result Date: 08/14/2019 CLINICAL DATA:  Fever. Shortness of breath. EXAM: PORTABLE CHEST 1 VIEW COMPARISON:  08/09/2019 and 09/05/2018 FINDINGS: Feeding tube tip is in the region of the duodenal bulb. Heart size and pulmonary vascularity are normal. Minimal residual atelectasis at the left base medially. The lungs are otherwise clear. CABG. No bone abnormality. IMPRESSION: 1. Minimal residual atelectasis at the left lung base. Lung aeration has improved bilaterally. 2. Feeding tube tip is in the duodenal bulb. Electronically Signed   By: Lorriane Shire M.D.   On: 08/14/2019 10:06   DG Abd Portable 1V  Result Date: 08/15/2019 CLINICAL DATA:  Enteric tube placement EXAM: PORTABLE ABDOMEN - 1 VIEW COMPARISON:  08/09/2019 FINDINGS: Feeding tube enters the stomach and turned to the right. Tip appears to be in the region of the duodenal bulb. Normal bowel gas pattern. IMPRESSION: Feeding tube tip in the region of the duodenal bulb. Electronically Signed   By: Franchot Gallo M.D.   On: 08/15/2019 15:33   DG Abd Portable 1V  Result Date: 08/09/2019 CLINICAL DATA:  Incontinence EXAM: PORTABLE ABDOMEN - 1 VIEW COMPARISON:  July 26, 2013  FINDINGS: The exam is nearly nondiagnostic secondary to underpenetration. The visualized bowel gas pattern is nonobstructive and nonspecific. There is no definite acute osseous abnormality on this study. Vascular calcifications are noted. IMPRESSION: 1. Suboptimal exam as detailed above. 2. No definite acute abnormality. Electronically Signed   By: Constance Holster M.D.   On: 08/09/2019 16:11   EEG adult  Result Date: 08/14/2019 Lora Havens, MD     08/14/2019 11:45 AM Patient Name: Andres [name] MRN: [id number] Epilepsy Attending: Lora Havens Referring Physician/Provider: Dr Roland Rack Date: 08/13/2018 Duration: 25.56 mins Patient history: 37 M with history of seziure who presented with stroke with and frequent left-sided twitching concerning for seizure. EEG to evaluate for seizure. Level of alertness: asleep/sedated AEDs during EEG study: LEV Technical aspects: This EEG study was done with scalp electrodes positioned according to the 10-20 International system of electrode placement. Electrical activity was acquired at a sampling rate of 500Hz  and reviewed with a high frequency filter of 70Hz  and a low frequency filter of 1Hz . EEG data were recorded continuously and digitally stored. DESCRIPTION: EEG showed continuous generalized and lateralized right hemisphere low amplitude 3-5hz  theta-delta slowing. Hyperventilation and photic stimulation were not performed. ABNORMALITY - Continuous slow, generalized and lateralized right hemisphere IMPRESSION: This study is suggestive of cortical dysfunction in right hemisphere likely secondary to underlying infarct as well as severe diffuse encephalopathy, non specific to etiology. No seizures or epileptiform discharges were seen throughout the recording. Lora Havens   EEG adult  Result Date: 08/09/2019 Lora Havens, MD     08/09/2019  7:27 PM Patient Name: Andres [name] MRN: [id number] Epilepsy Attending: Lora Havens Referring  Physician/Provider: Dr Antony Contras Date: 08/08/2018 Duration: 22.13 mins Patient history: 79yo M with prior stroke and seizures who presented with left sided weakness. EEG to evaluate for seizure Level of alertness: awake AEDs during EEG study: Keppra Technical aspects: This EEG study was done with scalp  electrodes positioned according to the 10-20 International system of electrode placement. Electrical activity was acquired at a sampling rate of 500Hz  and reviewed with a high frequency filter of 70Hz  and a low frequency filter of 1Hz . EEG data were recorded continuously and digitally stored. DESCRIPTION: No clear posterior dominant rhythm was seen. EEG showed continuous generalized and lateralized right hemisphere 3-6hz  theta-delta slowing. Hyperventilation and photic stimulation were not performed. ABNORMALITY - Continuous slow, generalized and lateralized right hemisphere IMPRESSION: This study is suggestive of cortical dysfunction in right hemisphere consistent with underlying infarct. Additionally, there is evidence of moderate diffuse encephalopathy. No seizures or epileptiform discharges were seen throughout the recording. Priyanka Barbra Sarks   Overnight EEG with video  Result Date: 08/15/2019 Lora Havens, MD     08/15/2019  1:51 PM Patient Name: Andres [name] MRN: [id number] Epilepsy Attending: Lora Havens Referring Physician/Provider: Dr Karena Addison rule Duration: 08/13/2018 1128 to 08/15/2019 1250  Patient history: 6 M with history of seziure who presented with stroke with and frequent left-sided twitching concerning for seizure. EEG to evaluate for seizure.  Level of alertness:  Awake, lethragic  AEDs during EEG study: LEV  Technical aspects: This EEG study was done with scalp electrodes positioned according to the 10-20 International system of electrode placement. Electrical activity was acquired at a sampling rate of 500Hz  and reviewed with a high frequency filter of 70Hz  and a low frequency  filter of 1Hz . EEG data were recorded continuously and digitally stored.  DESCRIPTION:  During awake state, no clear posterior dominant rhythm is seen. EEG showed continuous generalized and lateralized right hemisphere low amplitude 3-5hz  theta-delta slowing. Hyperventilation and photic stimulation were not performed.  ABNORMALITY - Continuous slow, generalized and lateralized right hemisphere  IMPRESSION: This study is suggestive of cortical dysfunction in right hemisphere likely secondary to underlying infarct as well as severe diffuse encephalopathy, non specific to etiology. No seizures or epileptiform discharges were seen throughout the recording.  Lora Havens   ECHOCARDIOGRAM COMPLETE  Result Date: 08/09/2019   ECHOCARDIOGRAM REPORT   Patient Name:   Andres [name] Date of Exam: 08/09/2019 Medical Rec #:  [id number]       Height:       65.0 in Accession #:    [id number]      Weight:       245.1 lb Date of Birth:  April 04, 1941       BSA:          2.16 m Patient Age:    37 years        BP:           185/95 mmHg Patient Gender: M               HR:           86 bpm. Exam Location:  Inpatient Procedure: 2D Echo, Cardiac Doppler and Color Doppler Indications:    Stroke 434.91  History:        Patient has prior history of Echocardiogram examinations, most                 recent 09/06/2018. CAD, Stroke, Mitral Valve Prolapse,                 Signs/Symptoms:Dyspnea and Syncope; Risk Factors:Hypertension,                 Diabetes, Dyslipidemia and Former Smoker. Elevated troponin.  Sonographer:    Paulita Fujita RDCS Referring Phys: [id number] RONDELL A  SMITH  Sonographer Comments: Suboptimal subcostal window. Image acquisition challenging due to respiratory motion. IMPRESSIONS  1. Left ventricular ejection fraction, by visual estimation, is 60%. The left ventricle has normal function. There is no left ventricular hypertrophy.  2. Abnormal septal motion secondary to conduction delay.  3. Elevated left ventricular  end-diastolic pressure.  4. Left ventricular diastolic parameters are consistent with Grade II diastolic dysfunction (pseudonormalization).  5. Global right ventricle has normal systolic function.The right ventricular size is normal. No increase in right ventricular wall thickness.  6. Left atrial size was normal.  7. Right atrial size was normal.  8. The mitral valve is abnormal. Trivial mitral valve regurgitation. No evidence of mitral stenosis.  9. Mild bileaflet mitral valve prolapse. 10. The tricuspid valve is normal in structure. 11. The aortic valve is normal in structure. Aortic valve regurgitation is not visualized. No evidence of aortic valve sclerosis or stenosis. 12. The pulmonic valve was normal in structure. Pulmonic valve regurgitation is trivial. 13. The inferior vena cava is normal in size with greater than 50% respiratory variability, suggesting right atrial pressure of 3 mmHg. 14. The interatrial septum was not well visualized. 15. No intracardiac source of emboli noted. FINDINGS  Left Ventricle: Left ventricular ejection fraction, by visual estimation, is 60%. The left ventricle has normal function. The left ventricle has no regional wall motion abnormalities. There is no left ventricular hypertrophy. Abnormal (paradoxical) septal motion, consistent with left bundle branch block. Left ventricular diastolic parameters are consistent with Grade II diastolic dysfunction (pseudonormalization). Elevated left ventricular end-diastolic pressure. 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 Right Ventricle: The right ventricular size is normal. No increase in right ventricular wall thickness. Global RV systolic function is has normal systolic function. Left Atrium: Left atrial size was normal in size. Right Atrium: Right atrial size was normal in size Pericardium: There is no evidence of pericardial effusion. Mitral Valve: The mitral valve is abnormal. There is mild prolapse of of the mitral valve. Trivial mitral  valve regurgitation. No evidence of mitral valve stenosis by observation. Tricuspid Valve: The tricuspid valve is normal in structure. Tricuspid valve regurgitation is not demonstrated. Aortic Valve: The aortic valve is normal in structure. Aortic valve regurgitation is not visualized. The aortic valve is structurally normal, with no evidence of sclerosis or stenosis. Pulmonic Valve: The pulmonic valve was normal in structure. Pulmonic valve regurgitation is trivial. Pulmonic regurgitation is trivial. Aorta: The aortic root, ascending aorta and aortic arch are all structurally normal, with no evidence of dilitation or obstruction. Venous: The inferior vena cava is normal in size with greater than 50% respiratory variability, suggesting right atrial pressure of 3 mmHg. IAS/Shunts: The interatrial septum was not well visualized.  LEFT VENTRICLE PLAX 2D LVIDd:         5.40 cm       Diastology LVIDs:         3.90 cm       LV e' lateral:   8.81 cm/s LV PW:         0.80 cm       LV E/e' lateral: 10.3 LV IVS:        0.80 cm       LV e' medial:    5.66 cm/s LVOT diam:     1.90 cm       LV E/e' medial:  16.0 LV SV:         75 ml LV SV Index:   32.58 LVOT Area:  2.84 cm  LV Volumes (MOD) LV area d, A2C:    29.30 cm LV area d, A4C:    29.30 cm LV area s, A2C:    20.60 cm LV area s, A4C:    18.90 cm LV major d, A2C:   7.80 cm LV major d, A4C:   8.13 cm LV major s, A2C:   7.31 cm LV major s, A4C:   6.60 cm LV vol d, MOD A2C: 92.2 ml LV vol d, MOD A4C: 85.6 ml LV vol s, MOD A2C: 51.9 ml LV vol s, MOD A4C: 44.4 ml LV SV MOD A2C:     40.3 ml LV SV MOD A4C:     85.6 ml LV SV MOD BP:      40.7 ml RIGHT VENTRICLE RV S prime:     10.00 cm/s TAPSE (M-mode): 1.6 cm LEFT ATRIUM             Index       RIGHT ATRIUM           Index LA diam:        3.50 cm 1.62 cm/m  RA Area:     10.50 cm LA Vol (A2C):   27.7 ml 12.84 ml/m RA Volume:   20.30 ml  9.41 ml/m LA Vol (A4C):   36.4 ml 16.88 ml/m LA Biplane Vol: 31.8 ml 14.74 ml/m   AORTIC VALVE LVOT Vmax:   96.00 cm/s LVOT Vmean:  71.400 cm/s LVOT VTI:    0.160 m  AORTA Ao Root diam: 2.60 cm MITRAL VALVE MV Area (PHT): 3.93 cm             SHUNTS MV PHT:        55.97 msec           Systemic VTI:  0.16 m MV Decel Time: 193 msec             Systemic Diam: 1.90 cm MR Peak grad: 143.0 mmHg MR Vmax:      598.00 cm/s MV E velocity: 90.60 cm/s 103 cm/s MV A velocity: 83.20 cm/s 70.3 cm/s MV E/A ratio:  1.09       1.5  Cherlynn Kaiser MD Electronically signed by Cherlynn Kaiser MD Signature Date/Time: 08/09/2019/4:19:06 PM    Final     Labs:  Basic Metabolic Panel: Recent Labs  Lab 09/01/19 0516 09/03/19 0739 09/07/19 1700  NA 144 145  --   K 4.4 4.6  --   CL 103 101  --   CO2 28 33*  --   GLUCOSE 133* 180*  --   BUN 62* 57*  --   CREATININE 1.67* 1.55*  --   CALCIUM 9.8 10.3  --   MG  --   --  2.6*  PHOS  --   --  5.2*    CBC: No results for input(s): WBC, NEUTROABS, HGB, HCT, MCV, PLT in the last 168 hours.  CBG: Recent Labs  Lab 09/07/19 1151 09/07/19 1556 09/07/19 1956 09/07/19 2355 09/08/19 0355  GLUCAP 154* 205* 278* 271* 223*   Family history.  Mother with CAD diabetes mellitus.  Brother with CAD.  Maternal grandmother with hypertension.  Brother with pancreatic cancer.  Brother with kidney disease.  Denies any esophageal cancer hyperlipidemia  Brief HPI:   Andres [name] is a 79 y.o. right-handed male with history of hypertension, CKD stage III, morbid obesity with BMI 35.70, hyperlipidemia, history of seizure disorder maintained on Keppra, CAD with CABG, history  of CVA maintained on aspirin and Plavix, carotid stenosis, mitral valve prolapse, diet controlled diabetes mellitus, BPH and remote tobacco abuse.  Reportedly independent prior to admission using a single-point cane living with son.  Presented 08/09/2019 after found on the floor with left-sided weakness and slurred speech.  Cranial CT scan revealed hyperdense right MCA distal M1 and proximal M2  segments.  No acute hemorrhage.  Patient did not receive TPA.  CTA of head and neck showed short segment occlusion of the right middle cerebral artery at the M1 M2 junction.  Carotid and aortic atherosclerosis without stenosis.  MRI showed large area of acute infarction right MCA territory surrounding chronic infarct in the right parietal lobe.  Echocardiogram with ejection fraction of 65% without emboli.  EEG negative for seizure.  Admission chemistries with creatinine 1.35, SARS coronavirus negative, hemoglobin 13.8.  Neurology follow-up remained on aspirin and Plavix for CVA prophylaxis.  Subcutaneous Lovenox for DVT prophylaxis.  Keppra ongoing for seizure disorder.  Blood cultures did show coagulase-negative staph question contaminant remained on cefazolin per infectious disease repeat cultures no growth to date no plan for TEE with recommendations to complete Ancef 08/19/2019.  Patient remained n.p.o. with alternative means of nutritional support.  Patient was admitted for a comprehensive rehab program   Hospital Course: Andres [name] was admitted to rehab 08/19/2019 for inpatient therapies to consist of PT, ST and OT at least three hours five days a week. Past admission physiatrist, therapy team and rehab RN have worked together to provide customized collaborative inpatient rehab.  Pertaining to patient's acute right MCA infarction remained stable he would remain on aspirin and Plavix x3 months then Plavix alone.  Patient would need follow-up neurology services.  Ritalin was added to patient's regimen to help sustain better focus and attention to tasks.  Subcutaneous Lovenox for DVT prophylaxis throughout rehab course no bleeding episodes.  Patient was using Voltaren gel as needed for pain management.  Noted profound dysphagia nasogastric tube initially in place patient failed swallow study attempts at interventional radiology for PEG tube placement however patient with poor anatomy thus underwent  successful placement of G-tube by general surgery 09/06/2019 open procedure with anticipation if diet was to improve would need to be removed by general surgery.  Patient remained on Keppra for seizure prophylaxis no seizure activity.  Blood pressure controlled on Lopressor 25 mg twice daily as well as amlodipine..  Patient with history of diabetes mellitus hemoglobin A1c 6.2 monitor closely.  CKD stage III with latest creatinine 1.55.  Patient remained on Lipitor for hyperlipidemia.  CKD stage III with follow-up per renal services.  Free water was adjusted per PEG tube.  Chest x-ray 09/08/2019 showed right lower lobe pneumonia and placed on Unasyn.  Patient with noted increasing in respirations as well as hypotension and lethargy.  Ongoing discussions held with daughter in regards to palliative care as well as DNR status with medical [facility] hospitalist team consulted for further input as well as consultation with palliative care.  It was later decided on plan for DNR at family's request after long discussions in regards to patient's outcome.  He was discharged to acute care services 09/09/2019 as he was no longer able to participate with therapies    Rehab course: During patient's stay in rehab weekly team conferences were held to monitor patient's progress, set goals and discuss barriers to discharge. At admission, patient required moderate assist sit to stand, max assist sit to supine, total assist side-lying to sitting.  Max  assist upper body dressing max is lower body dressing max assist toilet transfers max assist upper lower body bathing.  Physical exam.  Blood pressure 144/90 pulse 90 temperature 98.1 respirations 18 oxygen saturation 99% room air Constitutional.  Morbidly obese HEENT Head.  Normocephalic and atraumatic Eyes pupils round and reactive to light no discharge without nystagmus Neck.  Supple nontender no JVD without thyromegaly Respiratory effort normal no respiratory distress without  wheeze Cardiac regular rate rhythm without any extra sounds or murmur heard GI soft nontender positive bowel sounds without rebound Musculoskeletal lower extremity edema +1 Neurological.  Patient globally aphasic follows some simple commands generalized weakness difficult to grade due to aphasia limited by ability to follow commands however moving right side freely no movement noted left upper extremity.    Rehabilitation course.  Patient needed ongoing max to total assist to participate with therapies with steady decline and was discharged to acute care services       Disposition: Discharged to acute care services    Diet: NPO.  Osmolite via G-tube    Abdominal binder to protect G-tube  Medications at discharge 1.  Tylenol as needed by tube 2.  Norvasc 5 mg daily by tube 3.  Aspirin 81 mg daily by tube 4.  Lipitor 40 mg daily by tube 5.  Voltaren gel 2 g every 6 hours as needed pain apply to affected area 6.  Keppra 750 mg twice daily by tube 7.  Ritalin 5 mg daily by tube 8.  Lopressor 50 mg twice daily by tube 9.  Oxycodone 5 mg every 6 hours by tube as needed 10.  Albuterol nebulizer treatments every 4 hours as needed 11.  Unasyn 3 g every 12 hours   Discharge Instructions    Ambulatory referral to Neurology   Complete by: As directed    An appointment is requested in approximately 4 weeks right MCA infarction      Follow-up Information    Kirsteins, Luanna Salk, MD Follow up.   Specialty: Physical Medicine and Rehabilitation Why: Patient will not need follow-up as he will be leaving the area Contact information: [place] [address] [phone]        Donnie Mesa, MD Follow up.   Specialty: General Surgery Why: call for appointment as needed Contact information: [place] [address] [phone]           Signed: Lavon Paganini [place] 09/08/2019, 4:00 AM

## 2019-09-07 NOTE — NC FL2 (Signed)
[place] LEVEL OF CARE SCREENING TOOL     IDENTIFICATION  Patient Name: Andres [name] Birthdate: 20-May-1941 Sex: male Admission Date (Current Location): 08/19/2019  [facility] and [place] Number:      Facility and Address:  [facility] [place], [place] [address], [place], [place] [address]      Provider Number:    Attending Physician Name and Address:  [name], MD  Relative Name and Phone Number:  Daughter Leory Beri (c) [phone]    Current Level of Care: Hospital Recommended Level of Care: [place] Prior Approval Number:    Date Approved/Denied: 09/09/19 PASRR Number:    Discharge Plan: SNF    Current Diagnoses: Patient Active Problem List   Diagnosis Date Noted  . Acute blood loss anemia   . Labile blood pressure   . Diabetes mellitus type 2 in obese ([place])   . 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 Seizures ([place])   . Stage 3 chronic kidney disease   . Dysphagia, post-stroke   . Coagulase negative Staphylococcus bacteremia 08/15/2019  . Acute ischemic stroke ([place]) 08/09/2019  . Transaminitis 08/09/2019  . History of seizures 08/09/2019  . Left hip pain 03/02/2019  . Syncope 09/05/2018  . Gout 07/10/2018  . Scrotal swelling 06/09/2018  . Urinary dribbling 06/03/2017  . Mass of scrotum 06/03/2017  . Injury of left hand 07/21/2015  . Hx of   sessile serrated colonic polyp 02/27/2015  . Chronic anticoagulation 12/22/2014  . Colon cancer screening 12/22/2014  . Morbid obesity ([place]) 09/29/2014  . Mitral valve prolapse 11/24/2013  . IT band syndrome 09/23/2013  . Hypotension, unspecified 07/21/2013  . Dyspnea 07/21/2013  . Elevated troponin I level- (pk Troponin 0.34 in setting of acute renal insufficency) 07/21/2013  . 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 Last nuc 2012 negative. 07/21/2013  . SBO (small bowel obstruction) ([place]) 07/21/2013  . Inguinal hernia, Rt  reduced, Lt present 07/21/2013  . Acute renal failure ([place]) 07/21/2013  . Pneumonia, aspiration ([place]) 07/21/2013  . Obesity (BMI 30-39.9) 01/17/2013  . Osteoarthritis, hand, primary localized 08/12/2012  . Preventative health care 04/11/2011  . MILD COGNITIVE IMPAIRMENT SO STATED 10/03/2010  . CEREBROVASCULAR ACCIDENT, HX OF 10/03/2010  . RASH-NONVESICULAR 05/12/2008  . TRANSIENT ISCHEMIC ATTACK 01/27/2008  . Diabetes ([place]) 12/08/2007  . ALLERGIC RHINITIS 12/08/2007  . DIVERTICULOSIS, COLON 12/08/2007  . LOW BACK PAIN 12/08/2007  . NEPHROLITHIASIS, HX OF 12/08/2007  . BPH (benign prostatic hyperplasia) 04/05/2007  . Hyperlipidemia 04/02/2007  . CARPAL TUNNEL SYNDROME, BILATERAL 04/02/2007  . CATARACT NOS 04/02/2007  . Essential hypertension 04/02/2007    Orientation RESPIRATION BLADDER Height & Weight     Self  Normal Incontinent Weight: 90.7 kg Height:  5\' 7"  (170.2 cm)  BEHAVIORAL SYMPTOMS/MOOD NEUROLOGICAL BOWEL NUTRITION STATUS      Incontinent Feeding tube(PEG with bolus feeding)  AMBULATORY STATUS COMMUNICATION OF NEEDS Skin   Total Care Does not communicate Other (Comment)(New PEG)                       Personal Care Assistance Level of Assistance  Total care       Total Care Assistance: Maximum assistance   Functional Limitations Info  Sight, Speech Sight Info: Impaired   Speech Info: Impaired    SPECIAL CARE FACTORS FREQUENCY  PT (By licensed PT), Speech therapy, OT (By licensed OT)     PT Frequency:  5x/week OT Frequency: 5x/week     Speech Therapy Frequency: 5x/week      Contractures Contractures Info: Not present    Additional Factors Info  Insulin Sliding Scale       Insulin Sliding Scale Info: Novolog 0-9 units q 4 hours       Current Medications (09/07/2019):  This is the current hospital active medication list Current Facility-Administered Medications  Medication Dose Route Frequency Provider Last Rate Last Admin  .  acetaminophen (TYLENOL) tablet 650 mg  650 mg Oral Q4H PRN Saverio Danker, PA-C   650 mg at 09/06/19 2045   Or  . acetaminophen (TYLENOL) 160 MG/5ML solution 650 mg  650 mg Per Tube Q4H PRN Saverio Danker, PA-C   650 mg at 09/03/19 2059   Or  . acetaminophen (TYLENOL) suppository 650 mg  650 mg Rectal Q4H PRN Saverio Danker, PA-C      . amLODipine (NORVASC) tablet 5 mg  5 mg Oral Daily Saverio Danker, PA-C   5 mg at 09/07/19 0753  . aspirin suppository 300 mg  300 mg Rectal Daily Saverio Danker, PA-C       Or  . aspirin chewable tablet 81 mg  81 mg Per Tube Daily Saverio Danker, PA-C   81 mg at 09/07/19 0753  . atorvastatin (LIPITOR) tablet 40 mg  40 mg Per Tube Daily Saverio Danker, PA-C   40 mg at 09/07/19 0753  . diclofenac Sodium (VOLTAREN) 1 % topical gel 2 g  2 g Topical Q6H PRN Saverio Danker, PA-C   2 g at 08/28/19 1156  . enoxaparin (LOVENOX) injection 40 mg  40 mg Subcutaneous Daily Saverio Danker, PA-C      . feeding supplement (OSMOLITE 1.5 CAL) liquid 1,000 mL  1,000 mL Per Tube Continuous Kirsteins, Luanna Salk, MD      . feeding supplement (PRO-STAT SUGAR FREE 64) liquid 30 mL  30 mL Per Tube BID Saverio Danker, PA-C   30 mL at 09/07/19 0753  . insulin aspart (novoLOG) injection 0-9 Units  0-9 Units Subcutaneous Q4H Saverio Danker, PA-C   1 Units at 09/07/19 0754  . levETIRAcetam (KEPPRA) 100 MG/ML solution 500 mg  500 mg Per Tube BID Saverio Danker, PA-C   500 mg at 09/07/19 0753  . MEDLINE mouth rinse  15 mL Mouth Rinse BID Saverio Danker, PA-C   15 mL at 09/06/19 2048  . methylphenidate (RITALIN) tablet 5 mg  5 mg Oral Daily Saverio Danker, PA-C   5 mg at 09/07/19 0753  . metoprolol tartrate (LOPRESSOR) tablet 50 mg  50 mg Per Tube BID Saverio Danker, PA-C   50 mg at 09/07/19 0753  . oxyCODONE (ROXICODONE) 5 MG/5ML solution 5 mg  5 mg Oral Q6H PRN Saverio Danker, PA-C      . polyethylene glycol (MIRALAX / GLYCOLAX) packet 17 g  17 g Oral Daily PRN Saverio Danker, PA-C   17 g at  09/02/19 2017     Discharge Medications: Please see discharge summary for a list of discharge medications.  Relevant Imaging Results:  Relevant Lab Results:   Additional Information    Margarito Liner, RN

## 2019-09-07 NOTE — Progress Notes (Signed)
[place] PHYSICAL MEDICINE & REHABILITATION PROGRESS NOTE  Subjective/Complaints:  ModA x 2 for sitting at EOB Pt non verbal Per PT was in standing frame yesterday   DIscussed G tube care with Gen surgery   ROS: Limited due to cognitive/behavioral     Objective: Vital Signs: Blood pressure (!) 119/96, pulse 64, temperature 98.2 F (36.8 C), resp. rate 18, height _0  (1.702 m), weight 90.7 kg, SpO2 96 %. No results found. No results for input(s): WBC, HGB, HCT, PLT in the last 72 hours. No results for input(s): NA, K, CL, CO2, GLUCOSE, BUN, CREATININE, CALCIUM in the last 72 hours.  Physical Exam: BP (!) 119/96 (BP Location: Right Arm)   Pulse 64   Temp 98.2 F (36.8 C)   Resp 18   Ht _1  (1.702 m)   Wt 90.7 kg   SpO2 96%   BMI 31.32 kg/m  Constitutional: No distress . Vital signs reviewed. Restless, eyes open, nonverbal HEENT: EOMI, oral membranes moist Neck: supple Cardiovascular: RRR without murmur. No JVD    Respiratory: CTA Bilaterally without wheezes or rales. Normal effort    GI: BS +, non-tender, non-distended   Skin: Warm and dry.  Intact. Psych: slow to arouse, arouses to verbal and tactile stim  Musc: Lower extremity edema. Neurological:  Alert makes eye contact, distracted Dysarthria/anarthric Motor: 3- Left shouder , hip flexor and knee ext, (spont movement not to command )--no changes Sensory no withdrawal to pinch  Severe Left neglect without change  Assessment/Plan: 1. Functional deficits secondary to right MCA infarct which require 3+ hours per day of interdisciplinary therapy in a comprehensive inpatient rehab setting.  Physiatrist is providing close team supervision and 24 hour management of active medical problems listed below.  Physiatrist and rehab team continue to assess barriers to discharge/monitor patient progress toward functional and medical goals  Care Tool:  Bathing        Body parts bathed by helper: (whole body)      Bathing assist Assist Level: 2 Helpers     Upper Body Dressing/Undressing Upper body dressing   What is the patient wearing?: Hospital gown only    Upper body assist Assist Level: Dependent - Patient 0%    Lower Body Dressing/Undressing Lower body dressing      What is the patient wearing?: Incontinence brief     Lower body assist Assist for lower body dressing: 2 Helpers     Toileting Toileting    Toileting assist Assist for toileting: 2 Helpers     Transfers Chair/bed transfer  Transfers assist     Chair/bed transfer assist level: Dependent - mechanical lift     Locomotion Ambulation   Ambulation assist   Ambulation activity did not occur: Safety/medical concerns          Walk 10 feet activity   Assist  Walk 10 feet activity did not occur: Safety/medical concerns        Walk 50 feet activity   Assist Walk 50 feet with 2 turns activity did not occur: Safety/medical concerns         Walk 150 feet activity   Assist Walk 150 feet activity did not occur: Safety/medical concerns         Walk 10 feet on uneven surface  activity   Assist Walk 10 feet on uneven surfaces activity did not occur: Safety/medical concerns         Wheelchair     Assist   Type of Wheelchair: Manual  Wheelchair assist level: Dependent - Patient 0% Max wheelchair distance: 150    Wheelchair 50 feet with 2 turns activity    Assist        Assist Level: Dependent - Patient 0%   Wheelchair 150 feet activity     Assist     Assist Level: Dependent - Patient 0%      Medical Problem List and Plan: 1.  Left side weakness with dysarthria and dysphagia secondary to acute right MCA infarction  Continue CIR PT,  OT ,SLP  Team conference today please see physician documentation under team conference tab, met with team  to discuss problems,progress, and goals. Formulized individual treatment plan based on medical history, underlying  problem and comorbidities.   Prognosis guarded due to size of R MCA infarct, basically entire MCA territory   2.  Antithrombotics: -DVT/anticoagulation: Lovenox             -antiplatelet therapy: Aspirin 81 mg daily, Plavix 75 mg daily x3 months then Plavix alone 3. Pain Management: Voltaren gel as needed 4. Mood: Provide emotional support             -antipsychotic agents: N/A 5. Neuropsych: This patient is not capable of making decisions on his own behalf. 6. Skin/Wound Care: Routine skin checks 7. Fluids/Electrolytes/Nutrition: Routine in and outs.   cognition and pharyngeal dysphagia necessitate NPO status -successful placement of G tube by Gen Surgery- Open procedure, removal in future would have to be done by Gen Surg 8.  Post stroke dysphagia.        N.p.o.             Nasogastric tube feeds held for surgery-may start today             9.  Seizure disorder.  after discussion with daughter , reduced  Keppra  567m BID to reduce lethargy no seizures  EEG negative  No seizures since admission to rehab 10.  Essential hypertension.  Lopressor 25 mg twice daily   Vitals:   09/06/19 2021 09/07/19 0408  BP: (!) 140/104 (!) 119/96  Pulse: 98 64  Resp: 18 18  Temp: 98.1 F (36.7 C) 98.2 F (36.8 C)  SpO2: 98% 96%   1/30 BP severely elevated. Will add amlodipine 536mdaily.  1/31: Much better controlled. Lability noted , amlodipine heald 2/2  11.  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  Hemoglobin A1c 6.2. CBG (last 3)  Recent Labs    09/06/19 2014 09/06/19 2359 09/07/19 0406  GLUCAP 139* 160* 146*   Good  Control but TF held, will start TF today  12.  CKD stage III.  Nephrology feels BUN/Creat elevation is due to inadequate free13.  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  Latest blood cultures question contaminant per infectious disease.  Ancef completed 08/19/2019.- afebrile              Repeat blood culture no growth 14.  CAD with history of  CABG 1997.  Continue aspirin and Plavix 15.  Hyperlipidemia: Continue Lipitor 16.  BPH.  Flomax 0.4 mg daily.               incont with low residuals  17.  Morbid obesity.  BMI 35.70.  Dietary follow-up 18.  Acute blood loss anemia Resolved   Hemoglobin 12.3 on 1/14, 14.7 on 1/18  19.  Lethargy may be sleep wake cycle related, unremarkable CBC, BMET, CXR  neg start ritalin trial   Ritalin helpful for alertness  46m=10mg, no improvement in function however,reduced to 5 mg, will change to am only   Continue sleep chart 20. Family support: family involved, unable to provide 24/7 care post d/c  LOS: 19 days A [place] TO [place] Andres [name] 09/07/2019, 8:20 AM

## 2019-09-07 NOTE — Patient Care Conference (Signed)
Inpatient RehabilitationTeam Conference and Plan of Care Update Date: 09/07/2019   Time: 10:00 AM    Patient Name: Andres [name]      Medical Record Number: [id number]  Date of Birth: 1941/05/04 Sex: Male         Room/Bed: 4W03C/4W03C-01 Payor Info: Payor: GENERIC MEDICARE ADVANTAGE / Plan: GENERIC MEDICARE ADVANTAGE / Product Type: *No Product type* /    Admit Date/Time:  08/19/2019  3:40 PM  Primary Diagnosis:  Right middle cerebral artery stroke [facility])  Patient Active Problem List   Diagnosis Date Noted  . Acute blood loss anemia   . Labile blood pressure   . Diabetes mellitus type 2 in obese ([place])   . 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 Seizures ([place])   . Stage 3 chronic kidney disease   . Dysphagia, post-stroke   . Coagulase negative Staphylococcus bacteremia 08/15/2019  . Acute ischemic stroke ([place]) 08/09/2019  . Transaminitis 08/09/2019  . History of seizures 08/09/2019  . Left hip pain 03/02/2019  . Syncope 09/05/2018  . Gout 07/10/2018  . Scrotal swelling 06/09/2018  . Urinary dribbling 06/03/2017  . Mass of scrotum 06/03/2017  . Injury of left hand 07/21/2015  . Hx of   sessile serrated colonic polyp 02/27/2015  . Chronic anticoagulation 12/22/2014  . Colon cancer screening 12/22/2014  . Morbid obesity ([place]) 09/29/2014  . Mitral valve prolapse 11/24/2013  . IT band syndrome 09/23/2013  . Hypotension, unspecified 07/21/2013  . Dyspnea 07/21/2013  . Elevated troponin I level- (pk Troponin 0.34 in setting of acute renal insufficency) 07/21/2013  . 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 Last nuc 2012 negative. 07/21/2013  . SBO (small bowel obstruction) ([place]) 07/21/2013  . Inguinal hernia, Rt reduced, Lt present 07/21/2013  . Acute renal failure ([place]) 07/21/2013  . Pneumonia, aspiration ([place]) 07/21/2013  . Obesity (BMI 30-39.9) 01/17/2013  . Osteoarthritis, hand, primary localized 08/12/2012  . Preventative health care 04/11/2011   . MILD COGNITIVE IMPAIRMENT SO STATED 10/03/2010  . CEREBROVASCULAR ACCIDENT, HX OF 10/03/2010  . RASH-NONVESICULAR 05/12/2008  . TRANSIENT ISCHEMIC ATTACK 01/27/2008  . Diabetes ([place]) 12/08/2007  . ALLERGIC RHINITIS 12/08/2007  . DIVERTICULOSIS, COLON 12/08/2007  . LOW BACK PAIN 12/08/2007  . NEPHROLITHIASIS, HX OF 12/08/2007  . BPH (benign prostatic hyperplasia) 04/05/2007  . Hyperlipidemia 04/02/2007  . CARPAL TUNNEL SYNDROME, BILATERAL 04/02/2007  . CATARACT NOS 04/02/2007  . Essential hypertension 04/02/2007    Expected Discharge Date: Expected Discharge Date: 09/09/19(Tentatively will DC to SNF on 09/09/19)  Team Members Present: Physician leading conference: Dr. Alysia Penna Social Worker Present: Lennart Pall, LCSW Nurse Present: Dorien Chihuahua, RN;Other (comment)(Blair Montanti, LPN) Case Manager: Karene Fry, RN PT Present: [name], PT OT Present: Darleen Crocker, OT SLP Present: Jettie Booze, CF-SLP PPS Coordinator present : Ileana Ladd, Burna Mortimer, SLP     Current Status/Progress Goal Weekly Team Focus  Bowel/Bladder   incontinent, LBM 09/03/19  less episodes of incontinence  timed toileting   Swallow/Nutrition/ Hydration   NPO, PEG, still little progress, working on oral acceptance of oral care and ice chip trials  Total A  PO trials as appropriate - oral acceptance and participation   ADL's   Total A to Total A of 2 for BADLs bedlevel, +2 or 3 assist beezy board transfers  mod A  Alertness, NMR, self care retraining, balance, functional transfers, cognition, family education   Mobility   Max A +2 bed mobility. mod assist-max assist sit<>stand  in parallel bars. max A+2 squat pivot transfers. new PNA found, limiting arousal and participation.  Max assist for bed mobility and transfers.  awareness, arousal, initiation, safety. bed mobility, sitting balance transfers family education   Communication   Max A verbal output and intelligibility  Max A   increasing verbal responses, intelligibility   Safety/Cognition/ Behavioral Observations  Max-Total A  Total A  focused attention, maintaining arousal longer periods of time, basic self care tasks, education   Pain   looks uncomfortable, non-verbal, restless, has tylenol, voltaren gel & oxycodone prn  less signs of pain  assess & treat as needed   Skin   MASD to groin & buttocks, no skin break down noted  no new areas of skin break down  assess q shift    Rehab Goals Patient on target to meet rehab goals: Yes Rehab Goals Revised: Goals were downgraded due to limited progress/lethargy and family aware of status *See Care Plan and progress notes for long and short-term goals.     Barriers to Discharge  Current Status/Progress Possible Resolutions Date Resolved   [place] home environment;Decreased caregiver support;Home environment access/layout;Lack of/limited family support;Nutrition means   Daughter working on SNF placement at discharge in [place]          Discharge Planning/Teaching Needs:  Plan changed to SNF due to limited progress and level of care      Team Discussion: Gastrostomy placed yesterday, start using G-tube today, abd binder, mitts ordered.  Bolus feeds in a couple days.  RN suctioned by resp therapy today.  OT +2 self care, transfers +2 safety, enjoys music during session, goals mod A.  PT max/tot bed, max +2 sitting to min A, music helps participation, max +2 BZ board, max +2 stand, no initiation, goals max A.  SLP max/tot A, not progressing.  Look at [facility] in [place] on Friday 09/09/19 tentatively.   Revisions to Treatment Plan: N/A     Medical Summary Current Status: Surgical gastrostomy placed yesterday, tube feedings to start today Weekly Focus/Goal: Convert to bolus feedings, prepare for discharge to skilled nursing  Barriers to Discharge: Nutrition means;Medical  stability   Possible Resolutions to Barriers: See above, dietary to assist with tube feed schedule   Continued Need for Acute Rehabilitation Level of Care: The patient requires daily medical management by a physician with specialized training in physical medicine and rehabilitation for the following reasons: Direction of a multidisciplinary physical rehabilitation program to maximize functional independence : Yes Medical management of patient stability for increased activity during participation in an intensive rehabilitation regime.: Yes Analysis of laboratory values and/or radiology reports with any subsequent need for medication adjustment and/or medical intervention. : Yes   I attest that I was present, lead the team conference, and concur with the assessment and plan of the team.   Retta Diones 09/08/2019, 3:56 PM   Team conference was held via web/ teleconference due to [place] - 19

## 2019-09-07 NOTE — Progress Notes (Signed)
Occupational Therapy Session Note  Patient Details  Name: Andres [name] MRN: [id number] Date of Birth: March 22, 1941  Today's Date: 09/07/2019 OT Individual Time: [id number] OT Individual Time Calculation (min): 32 min  and Today's Date: 09/07/2019 OT Missed Time: 15 Minutes Missed Time Reason: Pain   Short Term Goals: Week 3:  OT Short Term Goal 1 (Week 3): Pt will maintain sustained attention to 1 self care task for ~10 seconds after given min HOH facilitation for initiation OT Short Term Goal 2 (Week 3): Pt will initiate threading 1 UE into UB dressing garment when presented with garment  Skilled Therapeutic Interventions/Progress Updates:    Upon entering the room, pt supine in bed with daughter present in the room. Pt appears very restless and is moving head L <> R in rapid movements. Pt's vision does not appear to be focusing on anything. OT rolling pt L <> R with total A +2 to check brief and checking skin to look for any irritations. OT attempting to sit pt up on EOB with +2 assistance but pt twisting and failing UE and continuing to move head L <> R. OT returning pt to supine for safety and repositioned. OT notified RN and PA who believe it is likely pain related. OT provided caregiver with information and provided therapeutic use of self to listen to her concerns as well. Pt remained in bed with bed alarm activated and call bell within reach. RN arriving to give medications.   Therapy Documentation Precautions:  Precautions Precautions: Fall Precaution Comments: Lt hemi Restrictions Weight Bearing Restrictions: No General: General OT Amount of Missed Time: 15 Minutes Vital Signs: Therapy Vitals Temp: 98.9 F (37.2 C) Pulse Rate: 83 Resp: 14 BP: (!) 134/107 Patient Position (if appropriate): Lying Oxygen Therapy SpO2: 92 % O2 Device: Room Air Pain:   ADL: ADL Eating: Not assessed Grooming: Dependent Where Assessed-Grooming: Bed level Upper Body Bathing:  Dependent Where Assessed-Upper Body Bathing: Edge of bed Lower Body Bathing: Dependent Where Assessed-Lower Body Bathing: Bed level Upper Body Dressing: Dependent Where Assessed-Upper Body Dressing: Edge of bed Lower Body Dressing: Dependent Where Assessed-Lower Body Dressing: Bed level Toileting: Not assessed Toilet Transfer: Not assessed Tub/Shower Transfer: Not assessed ADL Comments: Pt is dependent +2 assist during self care tasks excluding grooming (+1 assist)   Therapy/Group: Individual Therapy  Gypsy Decant 09/07/2019, 1:36 PM

## 2019-09-07 NOTE — Progress Notes (Signed)
Speech Language Pathology Daily Session Note  Patient Details  Name: Andres [name] MRN: [id number] Date of Birth: [birth year]  Today's Date: 09/07/2019 SLP Individual Time: 1340-1405 SLP Individual Time Calculation (min): 25 min  Short Term Goals: Week 3: SLP Short Term Goal 1 (Week 3): Pt will consume therapeutic trials of thin liquids, ice chips, or purees with max assist multimodal cues and minimal overt s/s of aspiration over 3 consecutive sessions prior to instrumental assessment. SLP Short Term Goal 2 (Week 3): Pt will focus his attention to stimuli in >50% of opportunities with max assist multimodal cues. SLP Short Term Goal 3 (Week 3): Pt will remain alert for >3 minutes with max assist multimodal cues. SLP Short Term Goal 4 (Week 3): Pt will orient to place, date, and situation with max assist multimodal cues. SLP Short Term Goal 5 (Week 3): Pt will visually scan to the left of midline in >25% of opportunities during basic structured tasks with max assist multimodal cues.  Skilled Therapeutic Interventions: Pt was seen for skilled ST targeting cognition (arousal and initiation) and education with pt's daughter. Pt was lethargic but also somewhat restless (moving head back and forth and kicking feet). His eyes remained closed the entire session, however he turned his head in direction of SLP when asked to "look at me". Pt did not demonstrate task initiation in any opportunities today. He required Total A for basic self care tasks such as oral care and washing his face, repositioning head on a pillow. Pt also nonverbal throughout session. Pt's daughter present and asked appropriate questions regarding pt's decreased arousal and prognosis for swallowing. SLP emphasized it is difficult to set a firm timeline on these aspects, but he must be able to maintain appropriate arousal and then orally accept PO trials prior to any type of instrumental swallow study (which will be necessary) and subsequent  PO diet initiation. Pt's daughter verbally acknowledged these concepts and accepting of all education. Pt left laying in bed with daughter and RN at bedside. Continue per current plan of care.        Pain Pain Assessment Pain Scale: Faces Faces Pain Scale: Hurts a little bit Pain Location: Other (Comment)(unable to specify) Pain Descriptors / Indicators: Restless Pain Onset: On-going Pain Intervention(s): RN made aware;Other (Comment)(rn had already medicated)  Therapy/Group: Individual Therapy  Arbutus Leas 09/07/2019, 2:00 PM

## 2019-09-08 ENCOUNTER — Inpatient Hospital Stay (HOSPITAL_COMMUNITY): Payer: Medicare (Managed Care) | Admitting: Speech Pathology

## 2019-09-08 ENCOUNTER — Inpatient Hospital Stay (HOSPITAL_COMMUNITY): Payer: Medicare (Managed Care)

## 2019-09-08 ENCOUNTER — Inpatient Hospital Stay (HOSPITAL_COMMUNITY): Payer: Medicare (Managed Care) | Admitting: Physical Therapy

## 2019-09-08 DIAGNOSIS — N179 Acute kidney failure, unspecified: Secondary | ICD-10-CM

## 2019-09-08 DIAGNOSIS — J690 Pneumonitis due to inhalation of food and vomit: Secondary | ICD-10-CM

## 2019-09-08 DIAGNOSIS — J9601 Acute respiratory failure with hypoxia: Secondary | ICD-10-CM

## 2019-09-08 DIAGNOSIS — R1310 Dysphagia, unspecified: Secondary | ICD-10-CM

## 2019-09-08 LAB — CBC
HCT: 46.1 % (ref 39.0–52.0)
Hemoglobin: 14.8 g/dL (ref 13.0–17.0)
MCH: 32.9 pg (ref 26.0–34.0)
MCHC: 32.1 g/dL (ref 30.0–36.0)
MCV: 102.4 fL — ABNORMAL HIGH (ref 80.0–100.0)
Platelets: 225 10*3/uL (ref 150–400)
RBC: 4.5 MIL/uL (ref 4.22–5.81)
RDW: 12.8 % (ref 11.5–15.5)
WBC: 15.7 10*3/uL — ABNORMAL HIGH (ref 4.0–10.5)
nRBC: 0.1 % (ref 0.0–0.2)

## 2019-09-08 LAB — BASIC METABOLIC PANEL
Anion gap: 12 (ref 5–15)
BUN: 66 mg/dL — ABNORMAL HIGH (ref 8–23)
CO2: 26 mmol/L (ref 22–32)
Calcium: 10.4 mg/dL — ABNORMAL HIGH (ref 8.9–10.3)
Chloride: 112 mmol/L — ABNORMAL HIGH (ref 98–111)
Creatinine, Ser: 2.27 mg/dL — ABNORMAL HIGH (ref 0.61–1.24)
GFR calc Af Amer: 30 mL/min — ABNORMAL LOW (ref >60)
GFR calc non Af Amer: 26 mL/min — ABNORMAL LOW (ref >60)
Glucose, Bld: 256 mg/dL — ABNORMAL HIGH (ref 70–99)
Potassium: 5.1 mmol/L (ref 3.5–5.1)
Sodium: 150 mmol/L — ABNORMAL HIGH (ref 135–145)

## 2019-09-08 LAB — BLOOD GAS, ARTERIAL
Acid-Base Excess: 4.4 mmol/L — ABNORMAL HIGH (ref 0.0–2.0)
Bicarbonate: 28.8 mmol/L — ABNORMAL HIGH (ref 20.0–28.0)
Drawn by: 519031
FIO2: 100
O2 Saturation: 95.6 %
Patient temperature: 37.3
pCO2 arterial: 46.6 mmHg (ref 32.0–48.0)
pH, Arterial: 7.409 (ref 7.350–7.450)
pO2, Arterial: 87.3 mmHg (ref 83.0–108.0)

## 2019-09-08 LAB — GLUCOSE, CAPILLARY
Glucose-Capillary: 223 mg/dL — ABNORMAL HIGH (ref 70–99)
Glucose-Capillary: 258 mg/dL — ABNORMAL HIGH (ref 70–99)
Glucose-Capillary: 264 mg/dL — ABNORMAL HIGH (ref 70–99)
Glucose-Capillary: 304 mg/dL — ABNORMAL HIGH (ref 70–99)
Glucose-Capillary: 280 mg/dL — ABNORMAL HIGH (ref 70–99)

## 2019-09-08 LAB — PHOSPHORUS: Phosphorus: 4.1 mg/dL (ref 2.5–4.6)

## 2019-09-08 LAB — MAGNESIUM: Magnesium: 2.3 mg/dL (ref 1.7–2.4)

## 2019-09-08 MED ORDER — SODIUM CHLORIDE 0.9 % IV SOLN
3.0000 g | Freq: Four times a day (QID) | INTRAVENOUS | Status: DC
  Administered 2019-09-08 (×2): 3 g via INTRAVENOUS
  Filled 2019-09-08: qty 8
  Filled 2019-09-08 (×2): qty 3

## 2019-09-08 MED ORDER — ALBUTEROL SULFATE (2.5 MG/3ML) 0.083% IN NEBU
2.5000 mg | INHALATION_SOLUTION | RESPIRATORY_TRACT | Status: DC | PRN
  Administered 2019-09-08: 11:00:00 2.5 mg via RESPIRATORY_TRACT
  Filled 2019-09-08: qty 3

## 2019-09-08 MED ORDER — INSULIN ASPART 100 UNIT/ML ~~LOC~~ SOLN
0.0000 [IU] | SUBCUTANEOUS | Status: DC
  Administered 2019-09-08: 11 [IU] via SUBCUTANEOUS
  Administered 2019-09-08 – 2019-09-09 (×3): 15 [IU] via SUBCUTANEOUS
  Administered 2019-09-09: 11 [IU] via SUBCUTANEOUS
  Administered 2019-09-09: 09:00:00 15 [IU] via SUBCUTANEOUS

## 2019-09-08 MED ORDER — FREE WATER
350.0000 mL | Status: DC
  Administered 2019-09-08 – 2019-09-09 (×7): 350 mL

## 2019-09-08 MED ORDER — IPRATROPIUM-ALBUTEROL 0.5-2.5 (3) MG/3ML IN SOLN
3.0000 mL | RESPIRATORY_TRACT | Status: DC
  Administered 2019-09-08 – 2019-09-09 (×6): 3 mL via RESPIRATORY_TRACT
  Filled 2019-09-08 (×5): qty 3

## 2019-09-08 MED ORDER — OSMOLITE 1.5 CAL PO LIQD
1000.0000 mL | ORAL | Status: DC
  Administered 2019-09-08 – 2019-09-09 (×2): 1000 mL
  Filled 2019-09-08 (×3): qty 1000

## 2019-09-08 MED ORDER — DEXTROSE 5 % IV SOLN: INTRAVENOUS | Status: DC

## 2019-09-08 MED ORDER — SODIUM CHLORIDE 0.45 % IV SOLN: INTRAVENOUS | Status: DC

## 2019-09-08 MED ORDER — SODIUM CHLORIDE 0.9 % IV SOLN
3.0000 g | Freq: Two times a day (BID) | INTRAVENOUS | Status: DC
  Administered 2019-09-09 (×2): 3 g via INTRAVENOUS
  Filled 2019-09-08: qty 3
  Filled 2019-09-08: qty 8
  Filled 2019-09-08: qty 3

## 2019-09-08 NOTE — Consult Note (Addendum)
Medical Consultation   Andres [name]  [id number]  DOB: 1941/03/03  DOA: 08/19/2019  PCP: [name], MD   Outpatient Specialists:    Requesting physician: Dr. Alysia Penna, CIR MD  Reason for consultation: Acute respiratory failure with pneumonia   History of Present Illness: Andres [name] is an 79 y.o. 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  Patient had gastrostomy tube placed for nutrition due to severe dysphagia.  He also has a history of seizure disorder on Keppra, diabetes, chronic kidney disease stage III, coronary artery disease.  Patient has had worsening shortness of breath and respiratory status overnight.  He was noted to be tachypneic and required supplemental oxygen yesterday.  This morning he was placed on a nonrebreather.  Chest x-ray performed shows new right lower lobe pneumonia.  WBC count 15.7.  Sodium increased to 150.  Creatinine increased from 1.5-2.2.  He was started on intravenous Unasyn.  Hospitalist service was consulted to help with management of medical issues.   Review of Systems:  Unable to assess due to mental status   Past Medical History: Past Medical History:  Diagnosis Date  . ALLERGIC RHINITIS   . Benign prostatic hypertrophy   . 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 Last nuc 2012 negative. 07/21/2013  . Carpal tunnel syndrome, bilateral   . Cataracts, bilateral   . Coronary artery disease    Dr. Claiborne Billings; 2D ECHO, 12/11/2011 - EF 50-55%, normal; NUCLEAR STRESS TEST, 10/16/2010 - no evidemce of inducible ischemia  . Diabetes mellitus type II   . Diverticulosis of colon   . GERD (gastroesophageal reflux disease)   . H/O hiatal hernia   . Head mass   . Hepatitis   . Hx of   sessile serrated colonic polyp 02/27/2015  . Hyperlipidemia   . Hypertension   . Inguinal hernia, Rt reduced, Lt present  07/21/2013  . Low back pain   . Mitral valve prolapse 11/24/2013   By echo dec 2014  . Nephrolithiasis   . Obesity, morbid ([place])   . Occlusion and stenosis of carotid artery without mention of cerebral infarction    CAROTID DOPPLER, 03/18/2012 - srable, mild, hard, plaque noted, bilaterally  . Stroke [facility])     Past Surgical History: Past Surgical History:  Procedure Laterality Date  . APPENDECTOMY    . CARDIAC CATHETERIZATION    . CATARACT EXTRACTION Right   . CORONARY ARTERY BYPASS GRAFT    . GASTROSTOMY N/A 09/06/2019   Procedure: INSERTION OF GASTROSTOMY TUBE;  Surgeon: Donnie Mesa, MD;  Location: [facility];  Service: General;  Laterality: N/A;  . INGUINAL HERNIA REPAIR Right 07/26/2013   Procedure: REPAIR  INCARCERATED RIGHT INGUINAL  HERNIA ;  Surgeon: Gwenyth Ober, MD;  Location: [place];  Service: General;  Laterality: Right;     Allergies:  No Known Allergies   Social History:  reports that he quit smoking about 29 years ago. His smoking use included cigarettes. He has never used smokeless tobacco. He reports that he does not drink alcohol or use drugs.   Family History: Family History  Problem Relation Age of Onset  . Heart attack Mother   . Diabetes Mother   . Heart attack Brother   . Heart attack Maternal Grandmother   . Hypertension Maternal  Grandmother   . Pancreatic cancer Brother   . Kidney disease Brother   . Hypertension Son   . Kidney disease Son        ?  Marland Kitchen Alzheimer's disease Father   . Diabetes Brother   . Diabetes Sister     Physical Exam: Vitals:   09/08/19 0944 09/08/19 1105 09/08/19 1213 09/08/19 1352  BP: 113/81  (!) 122/93 126/76  Pulse: 86 89 91 96  Resp:  (!) 22 (!) 50 (!) 54  Temp: (!) 97.2 F (36.2 C)  99.3 F (37.4 C) 99.1 F (37.3 C)  TempSrc: Oral     SpO2: 97% 94% 99% 96%  Weight:      Height:        Constitutional: Somnolent, does not open his eyes to voice, has increased respiratory effort Eyes: PERLA, EOMI, irises  appear normal, anicteric sclera,  ENMT: external ears and nose appear normal            Lips appears normal, oropharynx mucosa, tongue, posterior pharynx appear normal  Neck: neck appears normal, no masses, normal ROM, no thyromegaly, no JVD  CVS: S1-S2 clear, no murmur rubs or gallops, no LE edema, normal pedal pulses  Respiratory: Bilateral rhonchi, tachypneic.  Abdomen: soft nontender, nondistended, normal bowel sounds, no hepatosplenomegaly, no hernias  Musculoskeletal: : no cyanosis, clubbing or edema noted bilaterally Neuro: Limited exam due to mental status. Psych: Unresponsive Skin: no rashes or lesions or ulcers, no induration or nodules   Data reviewed:  I have personally reviewed following labs and imaging studies Labs:  CBC: Recent Labs  Lab 09/08/19 0545  WBC 15.7*  HGB 14.8  HCT 46.1  MCV 102.4*  PLT 123456    Basic Metabolic Panel: Recent Labs  Lab 09/03/19 0739 09/07/19 1700 09/08/19 0545  NA 145  --  150*  K 4.6  --  5.1  CL 101  --  112*  CO2 33*  --  26  GLUCOSE 180*  --  256*  BUN 57*  --  66*  CREATININE 1.55*  --  2.27*  CALCIUM 10.3  --  10.4*  MG  --  2.6* 2.3  PHOS  --  5.2* 4.1   GFR Estimated Creatinine Clearance: 27.8 mL/min (A) (by C-G formula based on SCr of 2.27 mg/dL (H)). Liver Function Tests: No results for input(s): AST, ALT, ALKPHOS, BILITOT, PROT, ALBUMIN in the last 168 hours. No results for input(s): LIPASE, AMYLASE in the last 168 hours. No results for input(s): AMMONIA in the last 168 hours. Coagulation profile No results for input(s): INR, PROTIME in the last 168 hours.  Cardiac Enzymes: No results for input(s): CKTOTAL, CKMB, CKMBINDEX, TROPONINI in the last 168 hours. BNP: Invalid input(s): POCBNP CBG: Recent Labs  Lab 09/07/19 1956 09/07/19 2355 09/08/19 0355 09/08/19 0805 09/08/19 1149  GLUCAP 278* 271* 223* 258* 264*   D-Dimer No results for input(s): DDIMER in the last 72 hours. Hgb A1c No results for  input(s): HGBA1C in the last 72 hours. Lipid Profile No results for input(s): CHOL, HDL, LDLCALC, TRIG, CHOLHDL, LDLDIRECT in the last 72 hours. Thyroid function studies No results for input(s): TSH, T4TOTAL, T3FREE, THYROIDAB in the last 72 hours.  Invalid input(s): FREET3 Anemia work up No results for input(s): VITAMINB12, FOLATE, FERRITIN, TIBC, IRON, RETICCTPCT in the last 72 hours. Urinalysis    Component Value Date/Time   COLORURINE YELLOW 08/23/2019 1435   APPEARANCEUR CLEAR 08/23/2019 1435   LABSPEC 1.017 08/23/2019 1435  PHURINE 6.0 08/23/2019 [place] 08/23/2019 [place] 12/08/2018 [place] 08/23/2019 [place] 08/23/2019 1435   [place] 08/23/2019 1435   PROTEINUR 30 (A) 08/23/2019 1435   UROBILINOGEN 0.2 12/08/2018 1017   NITRITE NEGATIVE 08/23/2019 [place] 08/23/2019 1435     Microbiology Recent Results (from the past 240 hour(s))  MRSA PCR Screening     Status: None   Collection Time: 09/06/19  1:14 AM   Specimen: Nasal Mucosa; Nasopharyngeal  Result Value Ref Range Status   MRSA by PCR NEGATIVE NEGATIVE Final    Comment:        The GeneXpert MRSA Assay (FDA approved for NASAL specimens only), is one component of a comprehensive MRSA colonization surveillance program. It is not intended to diagnose MRSA infection nor to guide or monitor treatment for MRSA infections. Performed at [facility], [place] [address] [place], [place] [address]        Inpatient Medications:   Scheduled Meds: . amLODipine  5 mg Oral Daily  . aspirin  300 mg Rectal Daily   Or  . aspirin  81 mg Per Tube Daily  . atorvastatin  40 mg Per Tube Daily  . enoxaparin (LOVENOX) injection  40 mg Subcutaneous Daily  . feeding supplement (PRO-STAT SUGAR FREE 64)  30 mL Per Tube BID  . free water  350 mL Per Tube Q4H  . insulin aspart  0-20 Units Subcutaneous Q4H  . levETIRAcetam   750 mg Per Tube BID  . mouth rinse  15 mL Mouth Rinse BID  . methylphenidate  5 mg Oral Daily  . metoprolol tartrate  50 mg Per Tube BID   Continuous Infusions: . [START ON 09/09/2019] ampicillin-sulbactam (UNASYN) IV    . dextrose    . feeding supplement (OSMOLITE 1.5 CAL) 1,000 mL (09/08/19 1047)     Radiological Exams on Admission: DG Chest 2 View  Result Date: 09/08/2019 CLINICAL DATA:  Shortness of breath. EXAM: CHEST - 2 VIEW COMPARISON:  Chest x-ray 08/23/2019 FINDINGS: The cardiac silhouette, mediastinal and hilar contours are within normal limits and stable. 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 Right lower lobe airspace opacity consistent with pneumonia. No pleural effusions or pulmonary edema. IMPRESSION: Right lower lobe pneumonia. Electronically Signed   By: Marijo Sanes M.D.   On: 09/08/2019 05:10    Impression/Recommendations Principal Problem:   Right middle cerebral artery stroke [facility]) Active Problems:   Diabetes mellitus type 2 in obese (HCC)   Acute blood loss anemia   Labile blood pressure  1. Acute respiratory failure with hypoxia.  Suspect this is related to aspiration pneumonia with known dysphagia.  He is currently on nonrebreather.  Mental status has shown a decline.  Will check ABG.  Hemodynamics are currently stable.  We will try and wean down oxygen as tolerated 2. Aspiration pneumonia.  Currently on Unasyn.  Continue pulmonary hygiene with bronchodilators. 3. Dysphagia status post stroke.  Is currently n.p.o. and has been receiving tube feeds. 4. Diabetes, uncontrolled with hyperglycemia.  Changing sliding scale to resistant coverage. 5. Hypernatremia.  Related to free water deficit.  He has been receiving free water through G-tube.  Will start on D5W. 6. AKI on CKD stage III.  Likely related to dehydration and free water deficit.  Continue IV fluids and monitor renal function. 7. Acute encephalopathy.  Staff has noted worsening in mental  status, he is  less alert and more somnolent.  Possibly related to dehydration/hypernatremia, also hypoxia and possible hypercapnia could be playing a role.  Follow-up ABG.  Continue IV fluids.  Monitor mental status. 8. Seizure disorder.  Continue on Keppra. 9. Recent right MCA stroke.  Currently on aspirin 10. Goals of care.  Had extensive conversation with the patient's daughter regarding goals of care.  She is unsure whether she wishes to continue with full CODE STATUS versus DNR.  With patient's multiple comorbidities, severe dysphagia, I think a DNR status would be very appropriate.  I explained to the patient's daughter that even if he is put on a ventilator, this will not change his severe dysphagia and he will remain a high aspiration risk.  Patient's daughter says she would need to discuss these decisions with other family members and will let us know later today.  She does not wish for the patient's care to be escalated at this time until she is able to come to the decision.  Pending ABG results, will reach back out to patient's daughter if escalation of care is more emergent.  If family wishes to continue with full CODE STATUS, would recommend critical care consultation and possibly moving the patient to the ICU since he may need intubation.  Strongly recommend palliative care consult to further address goals of care.  Recommendations discussed with Linna Hoff, PA for CIR   Thank you for this consultation.  Our [facility] hospitalist team will follow the patient with you.   Time Spent: 44mins  Kathie Dike M.D. [facility] 09/08/2019, 3:09 PM  Addendum 15:30:  I had another discussion with patient's daughter over the phone after she had returned to see him.  She had discussed further resuscitative efforts with patient's family and they have decided not to escalate care any further.  They wish to continue with current measures including IV fluids, antibiotics, nebulizer treatments, supplemental  oxygen.  They are hopeful that he will improve with these measures, but do not want him to be intubated or other heroic measures.  DNR order placed in chart.  Would continue with current management.  [employer]

## 2019-09-08 NOTE — Progress Notes (Signed)
Patient ID: Andres [name], male   DOB: 06-10-1941, 79 y.o.   MRN: [id number] Overnight patient developed SOB with increased respirations and desat; placed on non-rebreather mask. CXR = RLL pna and started on abx. SNF in [place] notified of change in status and hold placed on discharge until medically cleared. Insurance notified of change in status and request for extension of coverage to next week due to medical changes.

## 2019-09-08 NOTE — Progress Notes (Signed)
Patient was noted at the beginning of shift laying in bed shifted to the left side. He is alert when called, but non-verbal. Non-rebreather mask was in place & his oxygen saturation was between 96-97% on the 15 liters. His respirations are still high in the low 40s. Peg tube was in place with taper dose of osmolite at 60 ml/hr. He seemed to be tolerating it well. He has IV fluids of D5 at 110ml/hr infusing as well. There were no signs of acute distress at the time. He tolerated medication administration via PEG well, tube feeding was increased to 26ml/hr with plan for him to reach his goal of 85ml/hr at next medication administration time. His daughter is present at bedside & felt that he needed to be sat up more. When moved, noticed that patient had an incontinent episode. He was given hygiene & oral care. No signs of distress noted. He seemed to be a little more responsive with his daughter a few minutes ago. Oxygen saturations are at 97-98%. No acute distress noted. Will continue to monitor

## 2019-09-08 NOTE — Plan of Care (Signed)
  Problem: Consults Goal: RH GENERAL PATIENT EDUCATION Description: See Patient Education module for education specifics. Outcome: Not Progressing  Problem: RH BOWEL ELIMINATION Goal: RH STG MANAGE BOWEL WITH ASSISTANCE Description: STG Manage Bowel with mod Assistance. Outcome: Not Progressing Goal: RH STG MANAGE BOWEL W/MEDICATION W/ASSISTANCE Description: STG Manage Bowel with Medication with min Assistance. Outcome: Not Progressing   Problem: RH BLADDER ELIMINATION Goal: RH STG MANAGE BLADDER WITH ASSISTANCE Description: STG Manage Bladder With mod Assistance Outcome: Not Progressing   Problem: RH SAFETY Goal: RH STG ADHERE TO SAFETY PRECAUTIONS W/ASSISTANCE/DEVICE Description: STG Adhere to Safety Precautions With  mod Assistance/Device. Outcome: Not Progressing

## 2019-09-08 NOTE — Progress Notes (Signed)
Physical Therapy Session Note  Patient Details  Name: Andres [name] MRN: [id number] Date of Birth: [birth year]  Today's Date: 09/08/2019 PT Individual Time: 1110-1140 PT Individual Time Calculation (min): 30 min   Short Term Goals: Week 3:  PT Short Term Goal 1 (Week 3): Pt will maintain sitting balance EOB with mod assist PT Short Term Goal 2 (Week 3): Pt will perform bed mobilty with max assist of 1 person consistently PT Short Term Goal 3 (Week 3): Pt will participate in reaching tasks with the R UE and initiate movement with only moderate assist 25% of the time  Skilled Therapeutic Interventions/Progress Updates:   Pt received supine in  With RT present administering breathing treatment. RN also present and reports that pt should get no therapy today, given new PNA diagnosis. Spoke with PA, who cleared pt for bed side therapy, if pt is able to arouse. Returned to pt's room and daughter initially resistant to any physical therapy with pt's respiratory status. Educated daughter on benefits of mobility given new PNA diagnosis for improved pulmonary circulation and airway clearance, and daughter agreeable to allow PT to attempt therapy. Attempted to arouse pt with gentle and noxious stimuli to sternum as well as nail bed in the Bil hands and feet with no response.  Attempted stimulate pt with cold wet wash cloth on face, but with no reflexive or volitional response noted. Given severe lethargy, pt left supine in bed daughter present. Throughout treatment SpO2 95% with rebreathed mask and supplemental O2.       Therapy Documentation Precautions:  Precautions Precautions: Fall Precaution Comments: Lt hemi Restrictions Weight Bearing Restrictions: No Vital Signs: Therapy Vitals Temp: (!) 97.2 F (36.2 C) Temp Source: Oral Pulse Rate: 89 Resp: (!) 22 BP: 113/81 Patient Position (if appropriate): Lying Oxygen Therapy SpO2: 94 % O2 Device: NRB O2 Flow Rate (L/min): 15  L/min Pain: Pain Assessment Pain Scale: Faces Pain Score: 0-No pain    Therapy/Group: Individual Therapy  Lorie Phenix 09/08/2019, 11:50 AM

## 2019-09-08 NOTE — Progress Notes (Signed)
[facility] PHYSICAL MEDICINE & REHABILITATION PROGRESS NOTE  Subjective/Complaints: Overnight patient experienced shortness of breath, RR increase to 40, and desaturated to 88% requiring oxygen supplementation with nonrebreather. CXR was performed, I have personally reviewed, shows right lower lobe pneumonia. I have personally reviewed labs, shows WBC of 15.7 and Na of 150, increase from 145. Cr increased from 1.55 to 2.27. Patient has been started on IV Unasyn and is now sleeping comfortably. I will call daughter to inform her.   ROS: Limited due to cognitive/behavioral     Objective: Vital Signs: Blood pressure (!) 136/92, pulse (!) 115, temperature 99.7 F (37.6 C), resp. rate (!) 28, height 5\' 7"  (1.702 m), weight 90.2 kg, SpO2 97 %. DG Chest 2 View  Result Date: 09/08/2019 CLINICAL DATA:  Shortness of breath. EXAM: CHEST - 2 VIEW COMPARISON:  Chest x-ray 08/23/2019 FINDINGS: The cardiac silhouette, mediastinal and hilar contours are within normal limits and stable. 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 Right lower lobe airspace opacity consistent with pneumonia. No pleural effusions or pulmonary edema. IMPRESSION: Right lower lobe pneumonia. Electronically Signed   By: Marijo Sanes M.D.   On: 09/08/2019 05:10   Recent Labs    09/08/19 0545  WBC 15.7*  HGB 14.8  HCT 46.1  PLT 225   Recent Labs    09/08/19 0545  NA 150*  K 5.1  CL 112*  CO2 26  GLUCOSE 256*  BUN 66*  CREATININE 2.27*  CALCIUM 10.4*    Physical Exam: BP (!) 136/92 (BP Location: Right Arm)   Pulse (!) 115 Comment: MD notified  Temp 99.7 F (37.6 C)   Resp (!) 28   Ht 5\' 7"  (1.702 m)   Wt 90.2 kg   SpO2 97%   BMI 31.15 kg/m  Constitutional: No distress . Vital signs reviewed.Sleeping comfortably with nonrebreather in place, nonverbal HEENT: EOMI, oral membranes moist Neck: supple Cardiovascular: RRR without murmur. No JVD    Respiratory: CTA Bilaterally without wheezes or rales.  Normal effort    GI: BS +, non-tender, non-distended   Skin: Warm and dry.  Intact. Psych: slow to arouse, arouses to verbal and tactile stim  Musc: Lower extremity edema. Neurological:  Alert makes eye contact, distracted Dysarthria/anarthric Motor: 3- Left shouder , hip flexor and knee ext, (spont movement not to command )--no changes Sensory no withdrawal to pinch  Severe Left neglect without change  Assessment/Plan: 1. Functional deficits secondary to right MCA infarct which require 3+ hours per day of interdisciplinary therapy in a comprehensive inpatient rehab setting.  Physiatrist is providing close team supervision and 24 hour management of active medical problems listed below.  Physiatrist and rehab team continue to assess barriers to discharge/monitor patient progress toward functional and medical goals  Care Tool:  Bathing        Body parts bathed by helper: (whole body)     Bathing assist Assist Level: 2 Helpers     Upper Body Dressing/Undressing Upper body dressing   What is the patient wearing?: Hospital gown only    Upper body assist Assist Level: Dependent - Patient 0%    Lower Body Dressing/Undressing Lower body dressing      What is the patient wearing?: Incontinence brief     Lower body assist Assist for lower body dressing: 2 Helpers     Toileting Toileting    Toileting assist Assist for toileting: 2 Helpers     Transfers Chair/bed transfer  Transfers assist  Chair/bed transfer assist level: Dependent - mechanical lift     Locomotion Ambulation   Ambulation assist   Ambulation activity did not occur: Safety/medical concerns          Walk 10 feet activity   Assist  Walk 10 feet activity did not occur: Safety/medical concerns        Walk 50 feet activity   Assist Walk 50 feet with 2 turns activity did not occur: Safety/medical concerns         Walk 150 feet activity   Assist Walk 150 feet activity did  not occur: Safety/medical concerns         Walk 10 feet on uneven surface  activity   Assist Walk 10 feet on uneven surfaces activity did not occur: Safety/medical concerns         Wheelchair     Assist   Type of Wheelchair: Manual    Wheelchair assist level: Dependent - Patient 0% Max wheelchair distance: 150    Wheelchair 50 feet with 2 turns activity    Assist        Assist Level: Dependent - Patient 0%   Wheelchair 150 feet activity     Assist     Assist Level: Dependent - Patient 0%      Medical Problem List and Plan: 1.  Left side weakness with dysarthria and dysphagia secondary to acute right MCA infarction  Continue CIR PT,  OT ,SLP   Prognosis guarded due to size of R MCA infarct, basically entire MCA territory   2.  Antithrombotics: -DVT/anticoagulation: Lovenox             -antiplatelet therapy: Aspirin 81 mg daily, Plavix 75 mg daily x3 months then Plavix alone 3. Pain Management: Voltaren gel as needed 4. Mood: Provide emotional support             -antipsychotic agents: N/A 5. Neuropsych: This patient is not capable of making decisions on his own behalf. 6. Skin/Wound Care: Routine skin checks 7. Fluids/Electrolytes/Nutrition: Routine in and outs.   cognition and pharyngeal dysphagia necessitate NPO status -successful placement of G tube by Gen Surgery- Open procedure, removal in future would have to be done by Gen Surg  8.  Post stroke dysphagia.        N.p.o.             Nasogastric tube feeds held for surgery-may start today              9.  Seizure disorder.  after discussion with daughter , reduced  Keppra  500mg  BID to reduce lethargy no seizures  EEG negative  No seizures since admission to rehab 10.  Essential hypertension.  Lopressor 25 mg twice daily   Vitals:   09/08/19 0417 09/08/19 0749  BP: (!) 136/92   Pulse: (!) 115   Resp: (!) 42 (!) 28  Temp: 99.7 F (37.6 C)   SpO2: 100% 97%   1/30 BP severely elevated.  Will add amlodipine 5mg  daily.  1/31: Much better controlled. Lability noted , amlodipine heald 2/2  2/4: very well controlled.  11.  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  Hemoglobin A1c 6.2. CBG (last 3)  Recent Labs    09/07/19 2355 09/08/19 0355 09/08/19 0805  GLUCAP 271* 223* 258*   Good  Control but TF held, will start TF today  2/4: Rising with initiation of TF. Continue to monitor.  12.  CKD stage III.  Nephrology feels BUN/Creat elevation is due to inadequate free 13.  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  Latest blood cultures question contaminant per infectious disease.  Ancef completed 08/19/2019.- afebrile              Repeat blood culture no growth 14.  CAD with history of CABG 1997.  Continue aspirin and Plavix 15.  Hyperlipidemia: Continue Lipitor 16.  BPH.  Flomax 0.4 mg daily.               incont with low residuals  17.  Morbid obesity.  BMI 35.70.  Dietary follow-up 18.  Acute blood loss anemia Resolved   Hemoglobin 12.3 on 1/14, 14.7 on 1/18, 14.8 on 2/4 19.  Lethargy may be sleep wake cycle related, unremarkable CBC, BMET, CXR  neg start ritalin trial   Ritalin helpful for alertness 5mg =10mg , no improvement in function however,reduced to 5 mg, will change to am only   Continue sleep chart 20. Family support: family involved, unable to provide 24/7 care post d/c 21. Right lower lobe pneumonia: 2/4: Overnight patient experienced shortness of breath, RR increase to 40, and desaturated to 88% requiring oxygen supplementation with nonrebreather. CXR was performed, I have personally reviewed, shows right lower lobe pneumonia. I have personally reviewed labs, shows WBC of 15.7 and Na of 150, increase from 145. Cr increased from 1.55 to 2.27. Patient has been started on IV Unasyn and is now sleeping comfortably. I will call daughter to inform her.   LOS: 20 days A FACE TO FACE EVALUATION WAS PERFORMED  Clide Deutscher Skyy Nilan 09/08/2019,  8:58 AM

## 2019-09-08 NOTE — Progress Notes (Signed)
Speech Language Pathology Weekly Progress and Session Note  Patient Details  Name: Andres [name] MRN: [id number] Date of Birth: 1940/12/06  Beginning of progress report period: August 29, 2019 End of progress report period: September 08, 2019  Today's Date: 09/08/2019 SLP Missed Time: 30 Minutes Missed Time Reason: Patient ill (Comment)(pt on non-rebreather d/t PNA)  Short Term Goals: Week 3: SLP Short Term Goal 1 (Week 3): Pt will consume therapeutic trials of thin liquids, ice chips, or purees with max assist multimodal cues and minimal overt s/s of aspiration over 3 consecutive sessions prior to instrumental assessment. SLP Short Term Goal 1 - Progress (Week 3): Not met SLP Short Term Goal 2 (Week 3): Pt will focus his attention to stimuli in >50% of opportunities with max assist multimodal cues. SLP Short Term Goal 2 - Progress (Week 3): Not met SLP Short Term Goal 3 (Week 3): Pt will remain alert for >3 minutes with max assist multimodal cues. SLP Short Term Goal 3 - Progress (Week 3): Met SLP Short Term Goal 4 (Week 3): Pt will orient to place, date, and situation with max assist multimodal cues. SLP Short Term Goal 4 - Progress (Week 3): Discontinued (comment)(discontinued d/t severity of impairment, goal not applicable at this time) SLP Short Term Goal 5 (Week 3): Pt will visually scan to the left of midline in >25% of opportunities during basic structured tasks with max assist multimodal cues. SLP Short Term Goal 5 - Progress (Week 3): Not met    New Short Term Goals: Week 4: SLP Short Term Goal 1 (Week 4): Pt will orally accept trials of ice chips/thin liquids x 2 per session with Max A multimodal cues. SLP Short Term Goal 2 (Week 4): Pt will focus his attention to stimuli in >50% of opportunities with max assist multimodal cues. SLP Short Term Goal 3 (Week 4): Pt will remain alert for >5 minutes with max assist multimodal cues. SLP Short Term Goal 4 (Week 4): Given Total  multimodal cues, pt will perform basic familiar ADL.  Weekly Progress Updates: Pt's progress continues to be limited by cognitive deficits and as such he recieved PEG placement during this reporting period. Pt continues to require skilled ST to target focused attention, hand over hand completion of basic tasks and orally accepting ice chips. As such, recommend pt continue therapy at SNF when medically stable.      Intensity: Minumum of 1-2 x/day, 30 to 90 minutes Frequency: 3 to 5 out of 7 days Duration/Length of Stay: SNF placement once medically ready (anticipate 1 week) Treatment/Interventions: Cognitive remediation/compensation;Cueing hierarchy;Functional tasks;Patient/family education;Environmental controls;Dysphagia/aspiration precaution training;Internal/external aids   Daily Session  Skilled Therapeutic Interventions: Pt missed 30 minutes of skilled ST d/t pt on non-rebreather d/t PNA. Pt very lethargic and unable to arouse. Pt's therapy changed to QD.      General    Pain Pain Assessment Pain Scale: Faces Pain Score: 0-No pain  Therapy/Group: Individual Therapy  Jeannemarie Sawaya 09/08/2019, 8:46 AM

## 2019-09-08 NOTE — Progress Notes (Signed)
Went in to assess patient and family. He continues to have gurgling sounds with RR 44. Does not appear to be in distress with Pulse ox showing oxygen and HR to be stable. Dicussed use of morphine to help decrease RR/fatigue on body. Marland Kitchen She would prefer not to use anything due to the fact that he got ativan early in stay which had him sedated for 3 days. She is still hoping that he may pull thorough this and wants to give him a chance--does not want to hasten his death. She went thorough this situation with her mother and regrets letting her mother be intubated. Support provided. Cleared daughter to spend the night--discussed with Heather Roberts AD.

## 2019-09-08 NOTE — Progress Notes (Signed)
Pharmacy Antibiotic Note  Andres [name] is a 79 y.o. male admitted on 08/19/2019 with stroke, now with concern for aspiration pneumonia.  Pharmacy has been consulted for Unasyn dosing.  Scr rising today, estimated Crcl now ~ 27 ml/min  Plan: Adjust Unasyn to 3g q 12 hrs for now. Will watch renal function.  Height: 5\' 7"  (170.2 cm) Weight: 198 lb 13.7 oz (90.2 kg) IBW/kg (Calculated) : 66.1  Temp (24hrs), Avg:98.6 F (37 C), Min:97.2 F (36.2 C), Max:99.7 F (37.6 C)  Recent Labs  Lab 09/03/19 0739 09/08/19 0545  WBC  --  15.7*  CREATININE 1.55* 2.27*    Estimated Creatinine Clearance: 27.8 mL/min (A) (by C-G formula based on SCr of 2.27 mg/dL (H)).    No Known Allergies   Thank you for allowing pharmacy to be a part of this patient's care.  Marguerite Olea, [facility] Clinical Pharmacist Phone [phone]  09/08/2019 1:23 PM

## 2019-09-08 NOTE — Progress Notes (Signed)
Spoke with daughter due to lab being in room to obtain Mg, and Ph levels. These orders were currently in place. Explained that lab work would need to be obtained if continuing to do full code. Daughter expressed she would like for him to be left alone. RN explained DNR status and daughter understood and agreed to do DNR. Will notify PA and MD.   Erie Noe, RN

## 2019-09-08 NOTE — Progress Notes (Addendum)
All providers have seen patient today face to face. Patient's respirations at 50 on non rebreather mask at 15L. New vital signs documented, Dan, [place] is aware of vital signs. Charge nurse aware, Lonn [place], Therapist, sports. Verbal order given not to notify rapid response. No new orders. Will continue plan of care. [place]

## 2019-09-08 NOTE — Progress Notes (Signed)
Brief Note  Per conversation with MD on 2/3, pt was to be switched to bolus feeding today if tolerating continuous feedings well for 24 hours. Overnight, pt developed shortness of breath with increased respirations and desat and was placed on non-rebreather mask. CXR revealed RLL PNA and pt was started on abx. A hold was placed on the discharge until medically cleared. After discussion with RN, decision was made to leave the patient on continuous feeding for the time being. RD will continue to monitor and adjust as necessary.   Larkin Ina, MS, RD, LDN RD pager number and weekend/on-call pager number located in [place].

## 2019-09-08 NOTE — Progress Notes (Signed)
Pharmacy Antibiotic Note  Andres [name] is a 79 y.o. male admitted on 08/19/2019 with stroke, now with concern for aspiration pneumonia.  Pharmacy has been consulted for Unasyn dosing.  Plan: Unasyn 3g IV Q6H.  Height: 5\' 7"  (170.2 cm) Weight: 199 lb 15.3 oz (90.7 kg) IBW/kg (Calculated) : 66.1  Temp (24hrs), Avg:99 F (37.2 C), Min:98.3 F (36.8 C), Max:99.7 F (37.6 C)  Recent Labs  Lab 09/03/19 0739  CREATININE 1.55*    Estimated Creatinine Clearance: 40.8 mL/min (A) (by C-G formula based on SCr of 1.55 mg/dL (H)).    No Known Allergies   Thank you for allowing pharmacy to be a part of this patient's care.  Wynona Neat, PharmD, BCPS  09/08/2019 5:38 AM

## 2019-09-08 NOTE — Progress Notes (Signed)
Occupational Therapy Note  Patient Details  Name: Andres [name] MRN: [id number] Date of Birth: [birth year]  Today's Date: 09/08/2019 OT Missed Time: 66 Minutes Missed Time Reason: (Nursing hold (RR at 57))  Pt currently on 15L O2 on rebreather and resp rate at 50. RN hold therapy at this time till medically appropriate   [name] 09/08/2019, 2:06 PM

## 2019-09-08 NOTE — Progress Notes (Signed)
Report given to Rosita Fire, RN charge nurse. She will now be handling patient's care for rest of the day. [place]

## 2019-09-08 NOTE — Progress Notes (Signed)
Physical Therapy Session Note  Patient Details  Name: Andres [name] MRN: [id number] Date of Birth: [birth year]  Today's Date: 09/08/2019     Short Term Goals: Week 3:  PT Short Term Goal 1 (Week 3): Pt will maintain sitting balance EOB with mod assist PT Short Term Goal 2 (Week 3): Pt will perform bed mobilty with max assist of 1 person consistently PT Short Term Goal 3 (Week 3): Pt will participate in reaching tasks with the R UE and initiate movement with only moderate assist 25% of the time  Skilled Therapeutic Interventions/Progress Updates:   Pt in bed with respiratory present to obtain ABG. Per RN, pt has continued to decline since early this AM. Hospitalist consulted for medical management of Acute respiratory failure with strong recommendations for palliative care. Will re attempt for Therapy at later time/date, provided pt is medically appropriate.        Therapy Documentation Precautions:  Precautions Precautions: Fall Precaution Comments: Lt hemi Restrictions Weight Bearing Restrictions: No General: PT Amount of Missed Time (min): 45 Minutes PT Missed Treatment Reason: Patient ill (Comment) Vital Signs: Therapy Vitals Temp: 99.4 F (37.4 C) Temp Source: Oral Pulse Rate: 95 Resp: (!) 48 BP: (!) 132/94 Patient Position (if appropriate): Lying Oxygen Therapy SpO2: 98 % O2 Device: NRB O2 Flow Rate (L/min): 15 L/min    Therapy/Group: Individual Therapy  Lorie Phenix 09/08/2019, 5:16 PM

## 2019-09-08 NOTE — Consult Note (Signed)
Andres [name]    Assessment/ Plan:   1. Acute kidney Injury: Suspect related to volume depletion, recent transition to G-tube feeds. Will increase free water flushes to 351ml q4. May also continue w/ IV 1/2 NS overnight as ordered. Recheck in the am.   2. Hypovolemic Hypernatremia: Na 150. Plan as above. Recheck in the am.   3. AHRF 2/2 aspiration RLL PNA: On IV Unasyn and NRB per primary.   4. 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   5. HTN: stable, maintain lopressor BID.   Disposition: Tenuous clinical course, briefly discussed goals of care with patient's daughter at bedside. Pt currently full code.  Recommend palliative care consult for further discussion.    HPI:   Andres [name] is an 79 year old male with history of CKD stage III, hypertension, seizure disorder, CAD, diabetes, and previous CVA who was admitted on 08/09/2019 after being found down with left-sided weakness and slurred speech, found to have a large acute infarction in his right MCA. Currently in CIR since 1/15. Due to poor oral pharyngeal function, he received feeding via cortrak.  Without appreciable improvement in his swallowing function, a G-tube was placed on 2/2.  Receiving Osmolite 1.5 Cal continuous (20h/day) with prostat sugar-free supplement BID and q4 210 mL free water flushes starting yesterday, 2/3. Nephrology was consulted for evaluation of AKI with hypernatremia. Na 150 from 145 previously. Cr 2.27, 1.55 on 1/30.   Developed worsening respiratory status overnight, started on Unasyn for pneumonia. Currently on NRB. Daughter at bedside during evaluation. Daughter relays that she knows her father wouldn't want to continue like this given his high independence before the stroke, used to go to golf everyday and hang out with his "buddies." States she had a similar situation with her mother and had to be the one to "pull the  plug."  Daughter lives in [place].    Objective:   BP (!) 136/92 (BP Location: Right Arm)   Pulse (!) 115 Comment: MD notified  Temp 99.7 F (37.6 C)   Resp (!) 28   Ht 5\' 7"  (1.702 m)   Wt 90.2 kg   SpO2 97%   BMI 31.15 kg/m   Intake/Output Summary (Last 24 hours) at 09/08/2019 0853 Last data filed at 09/08/2019 0827 Gross per 24 hour  Intake 300 ml  Output --  Net 300 ml   Weight change: -0.5 kg  Physical Exam: Gen: Chronically ill appearing gentleman, w/ increased WOB on NRB  CVS: Regular rate  Resp: Tachypneic with NRB 10L in place. Loud upper airway gurgling. Clear anteriorly, decreased breath sounds posteriorly.   [id number], G tube w/ dressing in place  Neuro: Non-verbal, not following commands. Tries to look towards you when spoken to. Withdrawals extremities to pain.   Imaging: DG Chest 2 View  Result Date: 09/08/2019 CLINICAL DATA:  Shortness of breath. EXAM: CHEST - 2 VIEW COMPARISON:  Chest x-ray 08/23/2019 FINDINGS: The cardiac silhouette, mediastinal and hilar contours are within normal limits and stable. 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 Right lower lobe airspace opacity consistent with pneumonia. No pleural effusions or pulmonary edema. IMPRESSION: Right lower lobe pneumonia. Electronically Signed   By: Marijo Sanes M.D.   On: 09/08/2019 05:10    Labs: BMET Recent Labs  Lab 09/03/19 0739 09/07/19 1700 09/08/19 0545  NA 145  --  150*  K 4.6  --  5.1  CL 101  --  112*  CO2 33*  --  26  GLUCOSE 180*  --  256*  BUN 57*  --  66*  CREATININE 1.55*  --  2.27*  CALCIUM 10.3  --  10.4*  PHOS  --  5.2* 4.1   CBC Recent Labs  Lab 09/08/19 0545  WBC 15.7*  HGB 14.8  HCT 46.1  MCV 102.4*  PLT 225    Medications:    . amLODipine  5 mg Oral Daily  . aspirin  300 mg Rectal Daily   Or  . aspirin  81 mg Per Tube Daily  . atorvastatin  40 mg Per Tube Daily  . enoxaparin (LOVENOX) injection  40 mg Subcutaneous Daily  . feeding supplement  (PRO-STAT SUGAR FREE 64)  30 mL Per Tube BID  . insulin aspart  0-9 Units Subcutaneous Q4H  . levETIRAcetam  750 mg Per Tube BID  . mouth rinse  15 mL Mouth Rinse BID  . methylphenidate  5 mg Oral Daily  . metoprolol tartrate  50 mg Per Tube BID      Darrelyn Hillock, DO Family Medicine PGY-2  09/08/2019, 8:53 AM

## 2019-09-08 NOTE — Progress Notes (Signed)
RT NOTES: ABG obtained and sent to lab. Lab notified. 

## 2019-09-08 NOTE — Progress Notes (Signed)
Mittens not applied this shift, patient has ABD binder in place and was not observed pulling at any lines.

## 2019-09-09 ENCOUNTER — Inpatient Hospital Stay (HOSPITAL_COMMUNITY)
Admission: AD | Admit: 2019-09-09 | Discharge: 2019-09-19 | DRG: 177 | Disposition: A | Discharge status: Skilled Nursing Facility | Payer: Medicare (Managed Care) | Source: Intra-hospital | Attending: Internal Medicine | Admitting: Internal Medicine

## 2019-09-09 ENCOUNTER — Encounter (HOSPITAL_COMMUNITY): Payer: Self-pay | Admitting: Internal Medicine

## 2019-09-09 ENCOUNTER — Inpatient Hospital Stay (HOSPITAL_COMMUNITY): Payer: Medicare (Managed Care) | Admitting: Physical Therapy

## 2019-09-09 ENCOUNTER — Inpatient Hospital Stay (HOSPITAL_COMMUNITY): Payer: Medicare (Managed Care) | Admitting: Occupational Therapy

## 2019-09-09 ENCOUNTER — Inpatient Hospital Stay (HOSPITAL_COMMUNITY): Payer: Medicare (Managed Care) | Admitting: Speech Pathology

## 2019-09-09 DIAGNOSIS — I10 Essential (primary) hypertension: Secondary | ICD-10-CM | POA: Diagnosis present

## 2019-09-09 DIAGNOSIS — E785 Hyperlipidemia, unspecified: Secondary | ICD-10-CM | POA: Diagnosis present

## 2019-09-09 DIAGNOSIS — N183 Chronic kidney disease, stage 3 unspecified: Secondary | ICD-10-CM | POA: Diagnosis present

## 2019-09-09 DIAGNOSIS — R0609 Other forms of dyspnea: Secondary | ICD-10-CM | POA: Diagnosis not present

## 2019-09-09 DIAGNOSIS — Z794 Long term (current) use of insulin: Secondary | ICD-10-CM

## 2019-09-09 DIAGNOSIS — I129 Hypertensive chronic kidney disease with stage 1 through stage 4 chronic kidney disease, or unspecified chronic kidney disease: Secondary | ICD-10-CM | POA: Diagnosis present

## 2019-09-09 DIAGNOSIS — G40909 Epilepsy, unspecified, not intractable, without status epilepticus: Secondary | ICD-10-CM | POA: Diagnosis present

## 2019-09-09 DIAGNOSIS — I69354 Hemiplegia and hemiparesis following cerebral infarction affecting left non-dominant side: Secondary | ICD-10-CM

## 2019-09-09 DIAGNOSIS — N179 Acute kidney failure, unspecified: Secondary | ICD-10-CM | POA: Diagnosis present

## 2019-09-09 DIAGNOSIS — E1165 Type 2 diabetes mellitus with hyperglycemia: Secondary | ICD-10-CM | POA: Diagnosis present

## 2019-09-09 DIAGNOSIS — Z79899 Other long term (current) drug therapy: Secondary | ICD-10-CM | POA: Diagnosis not present

## 2019-09-09 DIAGNOSIS — Z8249 Family history of ischemic heart disease and other diseases of the circulatory system: Secondary | ICD-10-CM | POA: Diagnosis not present

## 2019-09-09 DIAGNOSIS — R0602 Shortness of breath: Secondary | ICD-10-CM

## 2019-09-09 DIAGNOSIS — I63511 Cerebral infarction due to unspecified occlusion or stenosis of right middle cerebral artery: Secondary | ICD-10-CM | POA: Diagnosis not present

## 2019-09-09 DIAGNOSIS — I69391 Dysphagia following cerebral infarction: Secondary | ICD-10-CM | POA: Diagnosis not present

## 2019-09-09 DIAGNOSIS — J9600 Acute respiratory failure, unspecified whether with hypoxia or hypercapnia: Secondary | ICD-10-CM | POA: Diagnosis present

## 2019-09-09 DIAGNOSIS — G9341 Metabolic encephalopathy: Secondary | ICD-10-CM | POA: Diagnosis present

## 2019-09-09 DIAGNOSIS — Z20822 Contact with and (suspected) exposure to covid-19: Secondary | ICD-10-CM | POA: Diagnosis present

## 2019-09-09 DIAGNOSIS — Z9189 Other specified personal risk factors, not elsewhere classified: Secondary | ICD-10-CM

## 2019-09-09 DIAGNOSIS — Z515 Encounter for palliative care: Secondary | ICD-10-CM

## 2019-09-09 DIAGNOSIS — N400 Enlarged prostate without lower urinary tract symptoms: Secondary | ICD-10-CM | POA: Diagnosis present

## 2019-09-09 DIAGNOSIS — R1310 Dysphagia, unspecified: Secondary | ICD-10-CM | POA: Diagnosis present

## 2019-09-09 DIAGNOSIS — E119 Type 2 diabetes mellitus without complications: Secondary | ICD-10-CM

## 2019-09-09 DIAGNOSIS — Z833 Family history of diabetes mellitus: Secondary | ICD-10-CM | POA: Diagnosis not present

## 2019-09-09 DIAGNOSIS — Z931 Gastrostomy status: Secondary | ICD-10-CM | POA: Diagnosis not present

## 2019-09-09 DIAGNOSIS — Z951 Presence of aortocoronary bypass graft: Secondary | ICD-10-CM | POA: Diagnosis not present

## 2019-09-09 DIAGNOSIS — E870 Hyperosmolality and hypernatremia: Secondary | ICD-10-CM | POA: Diagnosis present

## 2019-09-09 DIAGNOSIS — R451 Restlessness and agitation: Secondary | ICD-10-CM | POA: Diagnosis not present

## 2019-09-09 DIAGNOSIS — I2510 Atherosclerotic heart disease of native coronary artery without angina pectoris: Secondary | ICD-10-CM | POA: Diagnosis present

## 2019-09-09 DIAGNOSIS — J9601 Acute respiratory failure with hypoxia: Secondary | ICD-10-CM | POA: Diagnosis present

## 2019-09-09 DIAGNOSIS — J690 Pneumonitis due to inhalation of food and vomit: Secondary | ICD-10-CM | POA: Diagnosis present

## 2019-09-09 DIAGNOSIS — E869 Volume depletion, unspecified: Secondary | ICD-10-CM | POA: Diagnosis present

## 2019-09-09 DIAGNOSIS — R627 Adult failure to thrive: Secondary | ICD-10-CM | POA: Diagnosis not present

## 2019-09-09 DIAGNOSIS — E875 Hyperkalemia: Secondary | ICD-10-CM | POA: Diagnosis not present

## 2019-09-09 DIAGNOSIS — E1122 Type 2 diabetes mellitus with diabetic chronic kidney disease: Secondary | ICD-10-CM | POA: Diagnosis present

## 2019-09-09 DIAGNOSIS — J189 Pneumonia, unspecified organism: Secondary | ICD-10-CM | POA: Diagnosis present

## 2019-09-09 DIAGNOSIS — K219 Gastro-esophageal reflux disease without esophagitis: Secondary | ICD-10-CM | POA: Diagnosis present

## 2019-09-09 DIAGNOSIS — Z7982 Long term (current) use of aspirin: Secondary | ICD-10-CM

## 2019-09-09 DIAGNOSIS — I639 Cerebral infarction, unspecified: Secondary | ICD-10-CM | POA: Diagnosis not present

## 2019-09-09 DIAGNOSIS — Z87891 Personal history of nicotine dependence: Secondary | ICD-10-CM | POA: Diagnosis not present

## 2019-09-09 DIAGNOSIS — Z66 Do not resuscitate: Secondary | ICD-10-CM

## 2019-09-09 DIAGNOSIS — E11649 Type 2 diabetes mellitus with hypoglycemia without coma: Secondary | ICD-10-CM | POA: Diagnosis not present

## 2019-09-09 LAB — BASIC METABOLIC PANEL
Anion gap: 13 (ref 5–15)
BUN: 85 mg/dL — ABNORMAL HIGH (ref 8–23)
CO2: 25 mmol/L (ref 22–32)
Calcium: 9.4 mg/dL (ref 8.9–10.3)
Chloride: 105 mmol/L (ref 98–111)
Creatinine, Ser: 3.31 mg/dL — ABNORMAL HIGH (ref 0.61–1.24)
GFR calc Af Amer: 19 mL/min — ABNORMAL LOW (ref >60)
GFR calc non Af Amer: 17 mL/min — ABNORMAL LOW (ref >60)
Glucose, Bld: 386 mg/dL — ABNORMAL HIGH (ref 70–99)
Potassium: 5.1 mmol/L (ref 3.5–5.1)
Sodium: 143 mmol/L (ref 135–145)

## 2019-09-09 LAB — GLUCOSE, CAPILLARY
Glucose-Capillary: 232 mg/dL — ABNORMAL HIGH (ref 70–99)
Glucose-Capillary: 251 mg/dL — ABNORMAL HIGH (ref 70–99)
Glucose-Capillary: 277 mg/dL — ABNORMAL HIGH (ref 70–99)
Glucose-Capillary: 312 mg/dL — ABNORMAL HIGH (ref 70–99)
Glucose-Capillary: 325 mg/dL — ABNORMAL HIGH (ref 70–99)
Glucose-Capillary: 333 mg/dL — ABNORMAL HIGH (ref 70–99)

## 2019-09-09 LAB — CBC
HCT: 32.5 % — ABNORMAL LOW (ref 39.0–52.0)
Hemoglobin: 10.5 g/dL — ABNORMAL LOW (ref 13.0–17.0)
MCH: 33.3 pg (ref 26.0–34.0)
MCHC: 32.3 g/dL (ref 30.0–36.0)
MCV: 103.2 fL — ABNORMAL HIGH (ref 80.0–100.0)
Platelets: 156 10*3/uL (ref 150–400)
RBC: 3.15 MIL/uL — ABNORMAL LOW (ref 4.22–5.81)
RDW: 13.1 % (ref 11.5–15.5)
WBC: 7.5 10*3/uL (ref 4.0–10.5)
nRBC: 0 % (ref 0.0–0.2)

## 2019-09-09 LAB — CREATININE, SERUM
Creatinine, Ser: 3.05 mg/dL — ABNORMAL HIGH (ref 0.61–1.24)
GFR calc Af Amer: 21 mL/min — ABNORMAL LOW (ref >60)
GFR calc non Af Amer: 18 mL/min — ABNORMAL LOW (ref >60)

## 2019-09-09 MED ORDER — METHYLPHENIDATE HCL 5 MG PO TABS
5.0000 mg | ORAL_TABLET | Freq: Every day | ORAL | Status: DC
  Administered 2019-09-10 – 2019-09-11 (×2): 5 mg
  Filled 2019-09-09 (×2): qty 1

## 2019-09-09 MED ORDER — POLYETHYLENE GLYCOL 3350 17 G PO PACK: 17.0000 g | PACK | Freq: Every day | ORAL | Status: DC | PRN

## 2019-09-09 MED ORDER — AMLODIPINE BESYLATE 5 MG PO TABS
5.0000 mg | ORAL_TABLET | Freq: Every day | ORAL | Status: DC
  Administered 2019-09-14 – 2019-09-18 (×5): 5 mg via ORAL
  Filled 2019-09-09 (×8): qty 1

## 2019-09-09 MED ORDER — ACETAMINOPHEN 325 MG PO TABS: 650.0000 mg | ORAL_TABLET | ORAL | Status: AC | PRN

## 2019-09-09 MED ORDER — METOPROLOL TARTRATE 50 MG PO TABS
50.0000 mg | ORAL_TABLET | Freq: Two times a day (BID) | ORAL | Status: DC
  Administered 2019-09-10 – 2019-09-12 (×5): 50 mg
  Filled 2019-09-09 (×5): qty 1

## 2019-09-09 MED ORDER — FREE WATER: 350.0000 mL | Status: DC

## 2019-09-09 MED ORDER — INSULIN ASPART 100 UNIT/ML ~~LOC~~ SOLN: 0.0000 [IU] | Freq: Every day | SUBCUTANEOUS | Status: DC

## 2019-09-09 MED ORDER — ASPIRIN 81 MG PO CHEW
81.0000 mg | CHEWABLE_TABLET | Freq: Every day | ORAL | Status: DC
  Administered 2019-09-11 – 2019-09-18 (×8): 81 mg
  Filled 2019-09-09 (×10): qty 1

## 2019-09-09 MED ORDER — IPRATROPIUM-ALBUTEROL 0.5-2.5 (3) MG/3ML IN SOLN: 3.0000 mL | RESPIRATORY_TRACT | Status: DC

## 2019-09-09 MED ORDER — ONDANSETRON HCL 4 MG PO TABS: 4.0000 mg | ORAL_TABLET | Freq: Four times a day (QID) | ORAL | Status: DC | PRN

## 2019-09-09 MED ORDER — LEVETIRACETAM 100 MG/ML PO SOLN: 500.0000 mg | Freq: Two times a day (BID) | ORAL | Status: DC

## 2019-09-09 MED ORDER — DEXTROSE 5 % IV SOLN: 50.0000 mL | INTRAVENOUS | Status: DC

## 2019-09-09 MED ORDER — CHLORHEXIDINE GLUCONATE 0.12 % MT SOLN
15.0000 mL | Freq: Two times a day (BID) | OROMUCOSAL | Status: DC
  Administered 2019-09-09 – 2019-09-18 (×18): 15 mL via OROMUCOSAL
  Filled 2019-09-09 (×17): qty 15

## 2019-09-09 MED ORDER — ATORVASTATIN CALCIUM 40 MG PO TABS
40.0000 mg | ORAL_TABLET | Freq: Every day | ORAL | Status: DC
  Administered 2019-09-10 – 2019-09-18 (×6): 40 mg
  Filled 2019-09-09 (×6): qty 1

## 2019-09-09 MED ORDER — DEXTROSE 5 % IV SOLN: INTRAVENOUS | Status: DC

## 2019-09-09 MED ORDER — SODIUM CHLORIDE 0.9 % IV SOLN: INTRAVENOUS | Status: DC | PRN

## 2019-09-09 MED ORDER — INSULIN ASPART 100 UNIT/ML ~~LOC~~ SOLN: 0.0000 [IU] | SUBCUTANEOUS | 11 refills | Status: AC

## 2019-09-09 MED ORDER — LEVETIRACETAM 100 MG/ML PO SOLN
500.0000 mg | Freq: Two times a day (BID) | ORAL | Status: DC
  Administered 2019-09-09 – 2019-09-19 (×19): 500 mg
  Filled 2019-09-09 (×22): qty 5

## 2019-09-09 MED ORDER — ENOXAPARIN SODIUM 30 MG/0.3ML ~~LOC~~ SOLN: 30.0000 mg | Freq: Every day | SUBCUTANEOUS | Status: DC

## 2019-09-09 MED ORDER — SODIUM CHLORIDE 0.9 % IV SOLN: 3.0000 g | Freq: Two times a day (BID) | INTRAVENOUS | Status: DC

## 2019-09-09 MED ORDER — INSULIN ASPART 100 UNIT/ML ~~LOC~~ SOLN
0.0000 [IU] | SUBCUTANEOUS | Status: DC
  Administered 2019-09-09: 8 [IU] via SUBCUTANEOUS
  Administered 2019-09-09: 5 [IU] via SUBCUTANEOUS

## 2019-09-09 MED ORDER — HEPARIN SODIUM (PORCINE) 5000 UNIT/ML IJ SOLN
5000.0000 [IU] | Freq: Three times a day (TID) | INTRAMUSCULAR | Status: DC
  Administered 2019-09-09 – 2019-09-19 (×27): 5000 [IU] via SUBCUTANEOUS
  Filled 2019-09-09 (×26): qty 1

## 2019-09-09 MED ORDER — ONDANSETRON HCL 4 MG/2ML IJ SOLN
4.0000 mg | Freq: Four times a day (QID) | INTRAMUSCULAR | Status: DC | PRN
  Administered 2019-09-17: 4 mg via INTRAVENOUS
  Filled 2019-09-09: qty 2

## 2019-09-09 MED ORDER — OXYCODONE HCL 5 MG/5ML PO SOLN: 5.0000 mg | Freq: Four times a day (QID) | ORAL | 0 refills | Status: DC | PRN

## 2019-09-09 MED ORDER — IPRATROPIUM-ALBUTEROL 0.5-2.5 (3) MG/3ML IN SOLN
3.0000 mL | Freq: Three times a day (TID) | RESPIRATORY_TRACT | Status: DC
  Administered 2019-09-10: 3 mL via RESPIRATORY_TRACT
  Filled 2019-09-09: qty 3

## 2019-09-09 MED ORDER — ORAL CARE MOUTH RINSE
15.0000 mL | Freq: Two times a day (BID) | OROMUCOSAL | Status: DC
  Administered 2019-09-10 – 2019-09-18 (×14): 15 mL via OROMUCOSAL

## 2019-09-09 MED ORDER — AMLODIPINE BESYLATE 5 MG PO TABS: 5.0000 mg | ORAL_TABLET | Freq: Every day | ORAL | Status: DC

## 2019-09-09 MED ORDER — ALBUTEROL SULFATE (2.5 MG/3ML) 0.083% IN NEBU: 2.5000 mg | INHALATION_SOLUTION | RESPIRATORY_TRACT | 12 refills | Status: DC | PRN

## 2019-09-09 MED ORDER — SODIUM CHLORIDE 0.9 % IV SOLN
1.5000 g | Freq: Two times a day (BID) | INTRAVENOUS | Status: DC
  Administered 2019-09-09 – 2019-09-11 (×5): 1.5 g via INTRAVENOUS
  Filled 2019-09-09: qty 4
  Filled 2019-09-09: qty 1.5
  Filled 2019-09-09 (×4): qty 4
  Filled 2019-09-09: qty 1.5

## 2019-09-09 MED ORDER — PRO-STAT SUGAR FREE PO LIQD: 30.0000 mL | Freq: Two times a day (BID) | ORAL | 0 refills | Status: DC

## 2019-09-09 MED ORDER — OSMOLITE 1.5 CAL PO LIQD: 1000.0000 mL | ORAL | Status: DC

## 2019-09-09 MED ORDER — DOCUSATE SODIUM 100 MG PO CAPS
100.0000 mg | ORAL_CAPSULE | Freq: Two times a day (BID) | ORAL | Status: DC
  Administered 2019-09-09 – 2019-09-11 (×3): 100 mg via ORAL
  Filled 2019-09-09 (×5): qty 1

## 2019-09-09 MED ORDER — IPRATROPIUM-ALBUTEROL 0.5-2.5 (3) MG/3ML IN SOLN
3.0000 mL | Freq: Four times a day (QID) | RESPIRATORY_TRACT | Status: DC
  Administered 2019-09-09: 3 mL via RESPIRATORY_TRACT
  Filled 2019-09-09: qty 3

## 2019-09-09 NOTE — Progress Notes (Signed)
[facility] PHYSICAL MEDICINE & REHABILITATION PROGRESS NOTE  Subjective/Complaints:  Daughter at bedside, pt remained sleeping overnite,  Appreciate IM consult and assistance, DIscussed with daughter purpose of palliative care consult to establish goals of care  Daughter requests a letter indicating that her father cannot manage his affairs   ROS: Limited due to cognitive/behavioral     Objective: Vital Signs: Blood pressure 116/63, pulse 93, temperature 98.5 F (36.9 C), temperature source Axillary, resp. rate (!) 42, height 5\' 7"  (1.702 m), weight 90.2 kg, SpO2 100 %. DG Chest 2 View  Result Date: 09/08/2019 CLINICAL DATA:  Shortness of breath. EXAM: CHEST - 2 VIEW COMPARISON:  Chest x-ray 08/23/2019 FINDINGS: The cardiac silhouette, mediastinal and hilar contours are within normal limits and stable. 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 Right lower lobe airspace opacity consistent with pneumonia. No pleural effusions or pulmonary edema. IMPRESSION: Right lower lobe pneumonia. Electronically Signed   By: Marijo Sanes M.D.   On: 09/08/2019 05:10   Recent Labs    09/08/19 0545  WBC 15.7*  HGB 14.8  HCT 46.1  PLT 225   Recent Labs    09/08/19 0545  NA 150*  K 5.1  CL 112*  CO2 26  GLUCOSE 256*  BUN 66*  CREATININE 2.27*  CALCIUM 10.4*    Physical Exam: BP 116/63 (BP Location: Right Arm)   Pulse 93   Temp 98.5 F (36.9 C) (Axillary)   Resp (!) 42   Ht 5\' 7"  (1.702 m)   Wt 90.2 kg   SpO2 100%   BMI 31.15 kg/m  Constitutional: No distress . Vital signs reviewed.Sleeping comfortably with nonrebreather in place, nonverbal HEENT: EOMI, oral membranes moist Neck: supple Cardiovascular: RRR without murmur. No JVD    Respiratory: CTA Bilaterally without wheezes or rales. Normal effort    GI: BS +, non-tender, non-distended   Skin: Warm and dry.  Intact. Psych: slow to arouse, arouses to verbal and tactile stim  Musc: Lower extremity  edema. Neurological:  Alert makes eye contact, distracted Dysarthria/anarthric Motor: grips weakly with Right hand but otherwise unable to participate with MMT, Sensory no withdrawal to pinch  Severe Left neglect without change  Assessment/Plan: 1. Functional deficits secondary to right MCA infarct which require 3+ hours per day of interdisciplinary therapy in a comprehensive inpatient rehab setting.  Physiatrist is providing close team supervision and 24 hour management of active medical problems listed below.  Physiatrist and rehab team continue to assess barriers to discharge/monitor patient progress toward functional and medical goals  Care Tool:  Bathing        Body parts bathed by helper: (whole body)     Bathing assist Assist Level: 2 Helpers     Upper Body Dressing/Undressing Upper body dressing   What is the patient wearing?: Hospital gown only    Upper body assist Assist Level: Dependent - Patient 0%    Lower Body Dressing/Undressing Lower body dressing      What is the patient wearing?: Incontinence brief     Lower body assist Assist for lower body dressing: 2 Helpers     Toileting Toileting    Toileting assist Assist for toileting: 2 Helpers     Transfers Chair/bed transfer  Transfers assist     Chair/bed transfer assist level: Dependent - mechanical lift     Locomotion Ambulation   Ambulation assist   Ambulation activity did not occur: Safety/medical concerns          Walk 10  feet activity   Assist  Walk 10 feet activity did not occur: Safety/medical concerns        Walk 50 feet activity   Assist Walk 50 feet with 2 turns activity did not occur: Safety/medical concerns         Walk 150 feet activity   Assist Walk 150 feet activity did not occur: Safety/medical concerns         Walk 10 feet on uneven surface  activity   Assist Walk 10 feet on uneven surfaces activity did not occur: Safety/medical  concerns         Wheelchair     Assist   Type of Wheelchair: Manual    Wheelchair assist level: Dependent - Patient 0% Max wheelchair distance: 150    Wheelchair 50 feet with 2 turns activity    Assist        Assist Level: Dependent - Patient 0%   Wheelchair 150 feet activity     Assist     Assist Level: Dependent - Patient 0%      Medical Problem List and Plan: 1.  Left side weakness with dysarthria and dysphagia secondary to acute right MCA infarction  D/C CIR PT,  OT ,SLP- unable to participate, IM/Palliative consults, given inability to participate in CIR would rec transfer to acute bed (if/when available )    Prognosis guarded due to size of R MCA infarct, basically entire MCA territory   2.  Antithrombotics: -DVT/anticoagulation: Lovenox             -antiplatelet therapy: Aspirin 81 mg daily, Plavix 75 mg daily x3 months then Plavix alone 3. Pain Management: Voltaren gel as needed 4. Mood: Provide emotional support             -antipsychotic agents: N/A 5. Neuropsych: This patient is not capable of making decisions on his own behalf. 6. Skin/Wound Care: Routine skin checks 7. Fluids/Electrolytes/Nutrition: Routine in and outs.   cognition and pharyngeal dysphagia necessitate NPO status -successful placement of G tube by Gen Surgery- Open procedure, removal in future would have to be done by Gen Surg  8.  Post stroke dysphagia.        N.p.o.             Nasogastric tube feeds held for surgery-may start today              9.  Seizure disorder.  after discussion with daughter , reduced  Keppra  500mg  BID to reduce lethargy no seizures  EEG negative  No seizures since admission to rehab 10.  Essential hypertension.  Lopressor 25 mg twice daily   Vitals:   09/09/19 0429 09/09/19 0644  BP:    Pulse:  93  Resp:    Temp:    SpO2: 95% 100%   1/30 BP severely elevated. Will add amlodipine 5mg  daily.  1/31: Much better controlled. Lability noted ,  amlodipine heald 2/2  2/4: very well controlled.  11.  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  Hemoglobin A1c 6.2. CBG (last 3)  Recent Labs    09/09/19 0009 09/09/19 0414 09/09/19 0726  GLUCAP 251* 333* 325*   Cont SSI  2/4: Rising with initiation of TF. PNA likely contributing to elevated CBG Continue to monitor.  12.  CKD stage III.  Nephrology feels BUN/Creat elevation is due to inadequate free 13.  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  Latest blood cultures question contaminant per infectious disease.  Ancef completed 08/19/2019.-  afebrile              Repeat blood culture no growth 14.  CAD with history of CABG 1997.  Continue aspirin and Plavix 15.  Hyperlipidemia: Continue Lipitor 16.  BPH.  Flomax 0.4 mg daily.               incont with low residuals  17.  Morbid obesity.  BMI 35.70.  Dietary follow-up 18.  Acute blood loss anemia Resolved   Hemoglobin 12.3 on 1/14, 14.7 on 1/18, 14.8 on 2/4 19.  Lethargy may be sleep wake cycle related, unremarkable CBC, BMET, CXR  neg start ritalin trial   Ritalin helpful for alertness 5mg =10mg , no improvement in function however,reduced to 5 mg, will change to am only   Continue sleep chart 20. Family support: family involved, unable to provide 24/7 care post d/c 21. Right lower lobe pneumonia: 2/4: Overnight patient experienced shortness of breath, RR increase to 40, and desaturated to 88% requiring oxygen supplementation with nonrebreather. CXR was performed, I have personally reviewed, shows right lower lobe pneumonia. I have personally reviewed labs, shows WBC of 15.7 and Na of 150, increase from 145. Cr increased from 1.55 to 2.27. Patient has been started on IV Unasyn and is now sleeping comfortably. I will call daughter to inform her.   LOS: 21 days A FACE TO [place] N[place]ia [place] 09/09/2019, 7:49 AM

## 2019-09-09 NOTE — Progress Notes (Signed)
Occupational Therapy Session Note  Patient Details  Name: Andres [name] MRN: [id number] Date of Birth: 11/03/1940  Today's Date: 09/09/2019 OT Individual Time:  - 30 minutes missed       Skilled Therapeutic Interventions/Progress Updates:    Pt not seen for scheduled therapy as MD has discharged therapy orders.   Therapy Documentation Precautions:  Precautions Precautions: Fall Precaution Comments: Lt hemi Restrictions Weight Bearing Restrictions: No Vital Signs: Therapy Vitals Temp: 98.5 F (36.9 C) Temp Source: Axillary Pulse Rate: 93 Resp: (!) 42 BP: 116/63 Patient Position (if appropriate): Lying Oxygen Therapy SpO2: 100 % O2 Device: NRB O2 Flow Rate (L/min): 15 L/min  ADL: ADL Eating: Not assessed Grooming: Dependent Where Assessed-Grooming: Bed level Upper Body Bathing: Dependent Where Assessed-Upper Body Bathing: Edge of bed Lower Body Bathing: Dependent Where Assessed-Lower Body Bathing: Bed level Upper Body Dressing: Dependent Where Assessed-Upper Body Dressing: Edge of bed Lower Body Dressing: Dependent Where Assessed-Lower Body Dressing: Bed level Toileting: Not assessed Toilet Transfer: Not assessed Tub/Shower Transfer: Not assessed ADL Comments: Pt is dependent +2 assist during self care tasks excluding grooming (+1 assist)      Therapy/Group: Individual Therapy  Lashawndra Lampkins A Jamarion [place] 09/09/2019, 7:34 AM

## 2019-09-09 NOTE — Progress Notes (Signed)
Patient noted laying in bed. CBGs was taken & was over 300. Sliding scale was given as ordered. On call provider was called & informed about rising CBGs, IV dextrose & osmolite running. No new orders given at this time.

## 2019-09-09 NOTE — Progress Notes (Signed)
Patient ID: Andres [name], male   DOB: May 20, 1941, 79 y.o.   MRN: [id number]  To whom it may concern,   Mr. Cordae Cornelia, DOB [birth year]; was admitted to the hospital on 08/09/2019 and diagnosed with a right middle cerebral stroke.  He is not capable of making decisions on his own behalf.  [name], MD Physical Medicine and [place]

## 2019-09-09 NOTE — Progress Notes (Signed)
Patient is a yellow MEWS because he is only responding to pain. MD is aware. This is not a change in status.

## 2019-09-09 NOTE — Progress Notes (Signed)
Speech Language Pathology Discharge Summary  Patient Details  Name: Andres [name] MRN: [id number] Date of Birth: 1941/04/18    Pt has experienced medical decline and as a result he is unable to participate in skilled ST. MD has discharged therapy orders.    Cindy Fullman 09/09/2019, 9:33 AM

## 2019-09-09 NOTE — Care Management (Signed)
   The overall goal for the admission was met for: No; discharged to acute for medical issues  Discharge location: Discharge to acute services  Length of Stay: 21 days with discharge of 09/09/19  Discharge activity level: Total assist overall  Services provided included: MD, RD, PT, OT, SLP, RN, CM, Pharmacy, Neuropsych and SW  Financial Services:  Medicare  [facility] [place]  Follow-up services arranged: Other: Request for SNF in [place]; referral sent to [facility] in [place] Fransisco Beau is the intake coordinator  Comments (or additional information):Daughter had requested info on transportation to [place]; Arboriculturist in [place] agreed to provide service when ready. [phone]  Individual responsible for coordination of the follow-up plan: Daughter [name] [phone]   Margarito Liner 09/09/2019    Margarito Liner

## 2019-09-09 NOTE — H&P (Addendum)
[facility] History and Physical  KELLIE ATHAN [id number] DOB: [birth year] DOA: 09/09/2019  Referring physician: ED  PCP: [name], MD   History is limited due to patient's condition.  Spoke with the patient's family at bedside.  Chief Complaint: Shortness of breath  HPI: Andres [name] is a 79 y.o. male with past medical history of hypertension, seizure disorder, chronic kidney disease stage III, hyperlipidemia, coronary artery disease, was recently admitted to the hospital for right MCA stroke with left-sided weakness and dysphagia.  Patient did have a gastrostomy tube placed in and was subsequently transferred to rehabilitation facility.  Patient was having worsening shortness of breath and respiratory distress at the rehabilitation facility since yesterday and was put on nonrebreather mask.  Chest x-ray performed showed new right lower lobe pneumonia with elevated WBC count of 15.7.  He did have hyponatremia with a sodium of 150.  There was a increase in the creatinine levels as well so the medical team was consulted yesterday.  Due to worsening mental status and hypoxia, patient was considered for readmission to the hospital from rehabilitation unit.  Review of Systems:  All systems were reviewed and were negative unless otherwise mentioned in the HPI  Past Medical History:  Diagnosis Date  . ALLERGIC RHINITIS   . Benign prostatic hypertrophy   . 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 Last nuc 2012 negative. 07/21/2013  . Carpal tunnel syndrome, bilateral   . Cataracts, bilateral   . Coronary artery disease    Dr. Claiborne Billings; 2D ECHO, 12/11/2011 - EF 50-55%, normal; NUCLEAR STRESS TEST, 10/16/2010 - no evidemce of inducible ischemia  . Diabetes mellitus type II   . Diverticulosis of colon   . GERD (gastroesophageal reflux disease)   . H/O hiatal hernia   . Head mass   . Hepatitis   . Hx of   sessile serrated colonic polyp 02/27/2015  . Hyperlipidemia   .  Hypertension   . Inguinal hernia, Rt reduced, Lt present 07/21/2013  . Low back pain   . Mitral valve prolapse 11/24/2013   By echo dec 2014  . Nephrolithiasis   . Obesity, morbid ([place])   . Occlusion and stenosis of carotid artery without mention of cerebral infarction    CAROTID DOPPLER, 03/18/2012 - srable, mild, hard, plaque noted, bilaterally  . Stroke [facility])    Past Surgical History:  Procedure Laterality Date  . APPENDECTOMY    . CARDIAC CATHETERIZATION    . CATARACT EXTRACTION Right   . CORONARY ARTERY BYPASS GRAFT    . GASTROSTOMY N/A 09/06/2019   Procedure: INSERTION OF GASTROSTOMY TUBE;  Surgeon: Donnie Mesa, MD;  Location: [place];  Service: General;  Laterality: N/A;  . INGUINAL HERNIA REPAIR Right 07/26/2013   Procedure: REPAIR  INCARCERATED RIGHT INGUINAL  HERNIA ;  Surgeon: Gwenyth Ober, MD;  Location: [place];  Service: General;  Laterality: Right;    Social History:  reports that he quit smoking about 29 years ago. His smoking use included cigarettes. He has never used smokeless tobacco. He reports that he does not drink alcohol or use drugs.  No Known Allergies  Family History  Problem Relation Age of Onset  . Heart attack Mother   . Diabetes Mother   . Heart attack Brother   . Heart attack Maternal Grandmother   . Hypertension Maternal Grandmother   . Pancreatic cancer Brother   . Kidney disease Brother   . Hypertension Son   .  Kidney disease Son        ?  Marland Kitchen Alzheimer's disease Father   . Diabetes Brother   . Diabetes Sister      Prior to Admission medications   Medication Sig Start Date End Date Taking? Authorizing Provider  acetaminophen (TYLENOL) 325 MG tablet Take 2 tablets (650 mg total) by mouth every 4 (four) hours as needed for mild pain (or temp > 37.5 C (99.5 F)). 09/09/19   Angiulli, Lavon Paganini, PA-C  albuterol (PROVENTIL) (2.5 MG/3ML) 0.083% nebulizer solution Take 3 mLs (2.5 mg total) by nebulization every 4 (four) hours as needed for wheezing or  shortness of breath. 09/09/19   Angiulli, Lavon Paganini, PA-C  Amino Acids-Protein Hydrolys (FEEDING SUPPLEMENT, PRO-STAT SUGAR FREE 64,) LIQD Place 30 mLs into feeding tube 2 (two) times daily. 09/09/19   Angiulli, Lavon Paganini, PA-C  amLODipine (NORVASC) 5 MG tablet Take 1 tablet (5 mg total) by mouth daily. 09/10/19   Angiulli, Lavon Paganini, PA-C  Ampicillin-Sulbactam 3 g in sodium chloride 0.9 % 100 mL Inject 3 g into the vein every 12 (twelve) hours. 09/10/19   Angiulli, Lavon Paganini, PA-C  aspirin 81 MG chewable tablet Place 1 tablet (81 mg total) into feeding tube daily. 08/19/19   Aline [place], MD  dextrose 5 % solution Inject 50 mLs into the vein continuous. 09/09/19   Angiulli, Lavon Paganini, PA-C  insulin aspart (NOVOLOG) 100 UNIT/ML injection Inject 0-20 Units into the skin every 4 (four) hours. 09/09/19   Angiulli, Lavon Paganini, PA-C  ipratropium-albuterol (DUONEB) 0.5-2.5 (3) MG/3ML SOLN Take 3 mLs by nebulization every 4 (four) hours. 09/09/19   Angiulli, Lavon Paganini, PA-C  levETIRAcetam (KEPPRA) 750 MG tablet Take 1 tablet (750 mg total) by mouth 2 (two) times daily. Per tube 08/19/19   Aline [place], MD  metoprolol tartrate (LOPRESSOR) 50 MG tablet Place 1 tablet (50 mg total) into feeding tube 2 (two) times daily. 08/19/19   Aline [place], MD  Nutritional Supplements (FEEDING SUPPLEMENT, JEVITY 1.2 CAL,) LIQD Place 1,000 mLs into feeding tube continuous. 08/19/19   Aline [place], MD  oxyCODONE (ROXICODONE) 5 MG/5ML solution Take 5 mLs (5 mg total) by mouth every 6 (six) hours as needed for moderate pain. 09/09/19   Angiulli, Lavon Paganini, PA-C  polyethylene glycol (MIRALAX / GLYCOLAX) 17 g packet Place 17 g into feeding tube daily as needed for moderate constipation. 08/19/19   Aline [place], MD  tamsulosin (FLOMAX) 0.4 MG CAPS capsule Take 1 capsule (0.4 mg total) by mouth daily. Per tube 08/19/19   Aline [place], MD  Water For Irrigation, Sterile (FREE WATER) SOLN Place 350 mLs into feeding tube every 4 (four) hours. 09/09/19    Cathlyn Parsons, PA-C    Physical Exam: Vitals:   09/09/19 1614  BP: (!) 105/57  Pulse: 91  Resp: 16  Temp: 98.4 F (36.9 C)  TempSrc: Axillary  SpO2: 100%   Wt Readings from Last 3 Encounters:  09/08/19 90.2 kg  08/19/19 105 kg  06/08/19 105.7 kg   There is no height or weight on file to calculate BMI.  General: Somnolent, mildly responsive responds to tactile stimulus on nonrebreather mask.,  HENT: Normocephalic, pupils equally reacting to light .   Chest:    Diminished breath sounds bilaterally.  Mild crackles in the right lung.   CVS: S1 &S2 heard. No murmur.  Regular rate and rhythm. Abdomen: Soft, nontender, nondistended.  Bowel sounds are heard.   Extremities: No cyanosis, clubbing but trace lower  extremity edema, peripheral pulses are palpable. Psych: Somnolent, not following commands CNS: Somnolent, not following commands, left-sided weakness.  No movement of the left side.  Right hand grip positive. skin: Warm and dry.    Labs on Admission:   CBC: Recent Labs  Lab 09/08/19 0545  WBC 15.7*  HGB 14.8  HCT 46.1  MCV 102.4*  PLT [id number]    Basic Metabolic Panel: Recent Labs  Lab 09/03/19 0739 09/07/19 1700 09/08/19 0545 09/09/19 0949  NA 145  --  150* 143  K 4.6  --  5.1 5.1  CL 101  --  112* 105  CO2 33*  --  26 25  GLUCOSE 180*  --  256* 386*  BUN 57*  --  66* 85*  CREATININE 1.55*  --  2.27* 3.31*  CALCIUM 10.3  --  10.4* 9.4  MG  --  2.6* 2.3  --   PHOS  --  5.2* 4.1  --     Liver Function Tests: No results for input(s): AST, ALT, ALKPHOS, BILITOT, PROT, ALBUMIN in the last 168 hours. No results for input(s): LIPASE, AMYLASE in the last 168 hours. No results for input(s): AMMONIA in the last 168 hours.  Cardiac Enzymes: No results for input(s): CKTOTAL, CKMB, CKMBINDEX, TROPONINI in the last 168 hours.  BNP (last 3 results) No results for input(s): BNP in the last 8760 hours.  ProBNP (last 3 results) No results for input(s): PROBNP  in the last 8760 hours.  CBG: Recent Labs  Lab 09/08/19 2001 09/09/19 0009 09/09/19 0414 09/09/19 0726 09/09/19 1142  GLUCAP 280* 251* 333* 325* 312*    Lipase  No results found for: LIPASE   Urinalysis    Component Value Date/Time   COLORURINE YELLOW 08/23/2019 1435   APPEARANCEUR CLEAR 08/23/2019 1435   LABSPEC 1.017 08/23/2019 1435   PHURINE 6.0 08/23/2019 1435   GLUCOSEU NEGATIVE 08/23/2019 1435   GLUCOSEU NEGATIVE 12/08/2018 1017   HGBUR NEGATIVE 08/23/2019 1435   BILIRUBINUR NEGATIVE 08/23/2019 1435   KETONESUR NEGATIVE 08/23/2019 1435   PROTEINUR 30 (A) 08/23/2019 1435   UROBILINOGEN 0.2 12/08/2018 1017   NITRITE NEGATIVE 08/23/2019 1435   LEUKOCYTESUR NEGATIVE 08/23/2019 1435     Drugs of Abuse     Component Value Date/Time   LABOPIA NONE DETECTED 09/05/2018 1213   COCAINSCRNUR NONE DETECTED 09/05/2018 1213   LABBENZ NONE DETECTED 09/05/2018 1213   AMPHETMU NONE DETECTED 09/05/2018 1213   THCU NONE DETECTED 09/05/2018 1213   LABBARB NONE DETECTED 09/05/2018 1213      Radiological Exams on Admission: DG Chest 2 View  Result Date: 09/08/2019 CLINICAL DATA:  Shortness of breath. EXAM: CHEST - 2 VIEW COMPARISON:  Chest x-ray 08/23/2019 FINDINGS: The cardiac silhouette, mediastinal and hilar contours are within normal limits and stable. 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 Right lower lobe airspace opacity consistent with pneumonia. No pleural effusions or pulmonary edema. IMPRESSION: Right lower lobe pneumonia. Electronically Signed   By: Marijo Sanes M.D.   On: 09/08/2019 05:10    EKG: Personally reviewed by me which shows sinus rhythm  Assessment/Plan  Principal Problem:   Respiratory failure, acute ([place]) Active Problems:   Diabetes ([place])   Hyperlipidemia   Essential hypertension   Pneumonia, aspiration ([place])   Acute ischemic stroke ([place])  Acute respiratory failure with hypoxia likely from aspiration pneumonia.  Patient does have  underlying dysphagia with worsening mentation.  Currently on nonrebreather mask.  Aspiration pneumonia.  Continue Unasyn.  Continue supportive  care with pulmonary hygiene, bronchodilators and oxygen.  Patient is DO NOT RESUSCITATE.  Dysphagia ,status post stroke.  Is currently n.p.o. and has been receiving tube feeds. On osmolite 1.5 at 30ml/hr  Diabetes mellitus type II, uncontrolled with hyperglycemia.    Continue with sliding scale insulin.  We will continue with that here.   Hypernatremia secondary to free water deficit.  Continue water flushes through the G-tube.  Sodium of 143 today.  Patient was given D5 water for hypernatremia. Check Bmp in am  AKI on CKD stage III.    Worsening renal function.  Nephrology was consulted during rehab.  With his worsening mental condition no intervention has been planned.  Continue with D5 water for now.  Check BMP in a.m.  Acute metabolic encephalopathy.    Increasing somnolence and unresponsiveness.  Likely multifactorial from volume depletion and hyponatremia hypoxia.   Continue on a nonrebreather mask. Poor prognosis continue Npo.  Will avoid surgery if not narcotics.  Decrease the dose of Keppra to 500 twice daily for now.  Patient was giving 750 twice daily at the rehab.  Arterial blood gas done 09/08/2019 showed pH of 7.4 and PCO2 of 46.  We will get a CT head scan for follow-up.  Seizure disorder.  Continue on Keppra.  Decrease the dose to 500 twice daily.  Recent right MCA stroke with left sided hemineglect and hemiparesis and severe dysphagia with decreased responsiveness..  Currently on aspirin .  Goals of care.    Patient is a DO NOT RESUSCITATE DO NOT INTUBATE.  Palliative care has been consulted.  Patient will benefit from hospice level of care.  No escalation of care planned.  Overall prognosis of the patient is poor at this time.  Addendum:  09/09/2019 6:12 PM   I spoke with the patient's daughter about the goals of care again. She doesn't  wish aggressive care for her father including CT scans or tube feeding for now. Will discontinue.  DVT Prophylaxis: heparin subq  Consultant:   PMR  Code Status: DNR  Microbiology None  Antibiotics: Unasyn IV  Family Communication:  Patients' condition and plan of care including tests being ordered have been discussed with the family (daughter) at bedside who indicate understanding and agree with the plan.  Disposition Plan: undetermined at this time.  Severity of Illness: The appropriate patient status for this patient is INPATIENT. Inpatient status is judged to be reasonable and necessary in order to provide the required intensity of service to ensure the patient's safety. The patient's presenting symptoms, physical exam findings, and initial radiographic and laboratory data in the context of their chronic comorbidities is felt to place them at high risk for further clinical deterioration. Furthermore, it is not anticipated that the patient will be medically stable for discharge from the hospital within 2 midnights of admission. I certify that at the point of admission it is my clinical judgment that the patient will require inpatient hospital care spanning beyond 2 midnights from the point of admission due to high intensity of service, high risk for further deterioration and high frequency of surveillance required.   Signed, Flora Lipps, MD [facility] 09/09/2019

## 2019-09-09 NOTE — Progress Notes (Addendum)
Patient was noted laying in bed this morning. He seemed comfortable but still with rapid respirations & shallow breathing. His oxygen saturation has gone up to 100% on the nonrebreather. A new bottle of osmolite was hung. He tolerated the tube feeding well during the shift & is at his target rate for the feeding. His daughter is still present & was procedures were explained. He is sleeping at this time. Report was given to the oncoming nurse. Flushing & diaphoresis was passed on for monitoring

## 2019-09-09 NOTE — Progress Notes (Signed)
Report given to RN on 6N. Patient being transferred to 6N30 for medical issues. Patient transferred with RN, NT and patient belongings. Family aware of transfer.

## 2019-09-09 NOTE — Progress Notes (Signed)
Pharmacy Antibiotic Note  Andres [name] is a 79 y.o. male admitted on 09/09/2019 with pneumonia.  Pharmacy has been consulted for Unasyn dosing.  CrCL 19 ml/min  Plan: Start Unasyn 1.5 gm IV q12hr Monitor renal function and C&S.     Temp (24hrs), Avg:98.4 F (36.9 C), Min:98.1 F (36.7 C), Max:98.6 F (37 C)  Recent Labs  Lab 09/03/19 0739 09/08/19 0545 09/09/19 0949  WBC  --  15.7*  --   CREATININE 1.55* 2.27* 3.31*    Estimated Creatinine Clearance: 19.1 mL/min (A) (by C-G formula based on SCr of 3.31 mg/dL (H)).    No Known Allergies  Antimicrobials this admission: Unasyn 2/5 >>   Thank you for allowing pharmacy to be a part of this patient's care.  Alanda Slim, PharmD, [facility] Clinical Pharmacist Please see AMION for all Pharmacists' Contact Phone Numbers 09/09/2019, 5:15 PM

## 2019-09-09 NOTE — Progress Notes (Signed)
[facility] Progress Note    Assessment/ Plan:    AHRF 2/2 aspiration RLL PNA  Acute encephalopathy: Poor prognosis, cont GOC. On IV Unasyn and NRB per primary.    Acute kidney Injury: Cr 1.7 >2.2 >3.3 today. Suspect multifactorial 2/2 volume depletion/deficit w/ inc'd insensible losses due to above. Started G-tube feeds on 2/3.  Will continue with free water flushes via G-tube and IV fluids.  Could consider bladder scan given incontinence to rule out post-renal obstruction, however this is less likely contributing to acute rise.   Hypovolemic Hypernatremia: Resolved. Related to free water deficit. Na 143 today, maintain free water flushes.    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    HTN: stable, maintain lopressor BID.   Disposition: Tenuous clinical course with poor prognosis, patient transitioned to DNR status yesterday.  Recommend continued conversation with family regarding goals.    Subjective:   No acute events overnight.  Daughter at bedside.  States she told "several physicians" this morning that she did not wish to speak with palliative care.  Would like to continue aggressive care at this time including antibiotics, fluids, NRB.  DNR status.  Patient does have urinary incontinence and producing urine, wearing briefs.    Objective:   BP 116/63 (BP Location: Right Arm)   Pulse 93   Temp 98.5 F (36.9 C) (Axillary)   Resp (!) 42   Ht 5\' 7"  (1.702 m)   Wt 90.2 kg   SpO2 100%   BMI 31.15 kg/m   Intake/Output Summary (Last 24 hours) at 09/09/2019 [id number] Last data filed at 09/08/2019 1514 Gross per 24 hour  Intake 100 ml  Output --  Net 100 ml   Weight change:   Physical Exam: GEN: Chronically ill-appearing gentleman, sleeping comfortably with NRB in place CV: RRR Respiratory: Shallow rapid breaths with NRB, faint upper airway gurgling.  Clear anteriorly. Abdomen: Soft, G-tube in  place Psych/neuro: Nonresponsive, slightly opens eyes to significant tactile stimulation and withdrawals extremities to pain.  Not following commands.  Imaging: DG Chest 2 View  Result Date: 09/08/2019 CLINICAL DATA:  Shortness of breath. EXAM: CHEST - 2 VIEW COMPARISON:  Chest x-ray 08/23/2019 FINDINGS: The cardiac silhouette, mediastinal and hilar contours are within normal limits and stable. 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 Right lower lobe airspace opacity consistent with pneumonia. No pleural effusions or pulmonary edema. IMPRESSION: Right lower lobe pneumonia. Electronically Signed   By: Marijo Sanes M.D.   On: 09/08/2019 05:10    Labs: BMET Recent Labs  Lab 09/03/19 0739 09/07/19 1700 09/08/19 0545  NA 145  --  150*  K 4.6  --  5.1  CL 101  --  112*  CO2 33*  --  26  GLUCOSE 180*  --  256*  BUN 57*  --  66*  CREATININE 1.55*  --  2.27*  CALCIUM 10.3  --  10.4*  PHOS  --  5.2* 4.1   CBC Recent Labs  Lab 09/08/19 0545  WBC 15.7*  HGB 14.8  HCT 46.1  MCV 102.4*  PLT 225    Medications:    . amLODipine  5 mg Oral Daily  . aspirin  300 mg Rectal Daily   Or  . aspirin  81 mg Per Tube Daily  . atorvastatin  40 mg Per Tube Daily  . enoxaparin (LOVENOX) injection  40 mg Subcutaneous Daily  . feeding supplement (PRO-STAT SUGAR FREE 64)  30 mL Per Tube BID  . free water  350 mL Per Tube Q4H  . insulin aspart  0-20 Units Subcutaneous Q4H  . ipratropium-albuterol  3 mL Nebulization Q4H  . levETIRAcetam  750 mg Per Tube BID  . mouth rinse  15 mL Mouth Rinse BID  . metoprolol tartrate  50 mg Per Tube BID      Darrelyn Hillock, DO Family Medicine PGY-2  09/09/2019, 9:03 AM

## 2019-09-09 NOTE — Progress Notes (Addendum)
Patient noted in his room, asleep with rapid respirations. Rate of 49. Lung sounds are diminished, especially on the right lung. No rales or crackles heard on the left upper or lower lobe. oxygen saturation is holding at 96-98% on the nonrebreather mask. Nebulizer treatment was given by RRT approximately 30 minutes ago. IV ABT was hung as per ordered, via IV site to the right hand. Daughter was concerned because patiet became sweaty, flushed & seemed very warm. Temp was taken & was WNL. Could not pinpoint the reason for the sudden flush. May have been the IV ABT because it started right after the dose was completed. Will monitor & pass on for the next dose. D5 is still infusing to the left forearm. Peg tube feeding is continued at this time. CBG was taken & insulin coverage prescribed given. No signs of pain or discomfort. Daughter is at bedside. Will continue to monitor.

## 2019-09-09 NOTE — Progress Notes (Signed)
At about 2330 patient was observed restless in bed with audible faint gurgling.  O2 sat bouncing from 89-92?% via pulse ox. Patient was mouth breathing, snoring with periods of apnea lasting a few secs. Upon auscultation of lung feilds upper lobes clear, lower lobes were diminished. The patient has a weak cough. Dan Anguilli notified and gave new order for NTS PRN. Respiratory therapist called to bedside for NTS. After suctioning the patient began to sat in the 80's with respiratory therapist still at bedside, who then placed the patient on a NRB mask at 15L. and an continuous pulse ox was ordered.  The patient 02 sats remained above 95 and respiration remained at 20. At about 330 while reassessing the patient respirations were 35, shallow and labored. Dan who was on the floor seeing another patient was notified and at bedside to assess shortly after. Stat CXR ordered. This recorder accompanied transport to Xray. Xray results received and reported to [place]. Who then ordered IV ABT to be started and lab work. Respiratory at bedside at change of shift for NTS. Reported off to oncoming shift. This recorder called daughter to update her on the change condition and events that took place.

## 2019-09-10 DIAGNOSIS — I10 Essential (primary) hypertension: Secondary | ICD-10-CM

## 2019-09-10 LAB — COMPREHENSIVE METABOLIC PANEL
ALT: 22 U/L (ref 0–44)
AST: 51 U/L — ABNORMAL HIGH (ref 15–41)
Albumin: 2.3 g/dL — ABNORMAL LOW (ref 3.5–5.0)
Alkaline Phosphatase: 64 U/L (ref 38–126)
Anion gap: 11 (ref 5–15)
BUN: 80 mg/dL — ABNORMAL HIGH (ref 8–23)
CO2: 27 mmol/L (ref 22–32)
Calcium: 9.2 mg/dL (ref 8.9–10.3)
Chloride: 101 mmol/L (ref 98–111)
Creatinine, Ser: 2.61 mg/dL — ABNORMAL HIGH (ref 0.61–1.24)
GFR calc Af Amer: 26 mL/min — ABNORMAL LOW (ref >60)
GFR calc non Af Amer: 22 mL/min — ABNORMAL LOW (ref >60)
Glucose, Bld: 219 mg/dL — ABNORMAL HIGH (ref 70–99)
Potassium: 4.5 mmol/L (ref 3.5–5.1)
Sodium: 139 mmol/L (ref 135–145)
Total Bilirubin: 0.9 mg/dL (ref 0.3–1.2)
Total Protein: 5.4 g/dL — ABNORMAL LOW (ref 6.5–8.1)

## 2019-09-10 LAB — CBC
HCT: 32.8 % — ABNORMAL LOW (ref 39.0–52.0)
Hemoglobin: 10.7 g/dL — ABNORMAL LOW (ref 13.0–17.0)
MCH: 32.8 pg (ref 26.0–34.0)
MCHC: 32.6 g/dL (ref 30.0–36.0)
MCV: 100.6 fL — ABNORMAL HIGH (ref 80.0–100.0)
Platelets: 149 10*3/uL — ABNORMAL LOW (ref 150–400)
RBC: 3.26 MIL/uL — ABNORMAL LOW (ref 4.22–5.81)
RDW: 12.9 % (ref 11.5–15.5)
WBC: 7.1 10*3/uL (ref 4.0–10.5)
nRBC: 0.3 % — ABNORMAL HIGH (ref 0.0–0.2)

## 2019-09-10 LAB — GLUCOSE, CAPILLARY
Glucose-Capillary: 204 mg/dL — ABNORMAL HIGH (ref 70–99)
Glucose-Capillary: 195 mg/dL — ABNORMAL HIGH (ref 70–99)
Glucose-Capillary: 207 mg/dL — ABNORMAL HIGH (ref 70–99)
Glucose-Capillary: 229 mg/dL — ABNORMAL HIGH (ref 70–99)

## 2019-09-10 LAB — PHOSPHORUS: Phosphorus: 4.8 mg/dL — ABNORMAL HIGH (ref 2.5–4.6)

## 2019-09-10 LAB — MAGNESIUM: Magnesium: 2.3 mg/dL (ref 1.7–2.4)

## 2019-09-10 MED ORDER — IPRATROPIUM-ALBUTEROL 0.5-2.5 (3) MG/3ML IN SOLN: 3.0000 mL | RESPIRATORY_TRACT | Status: DC | PRN

## 2019-09-10 MED ORDER — INSULIN ASPART 100 UNIT/ML ~~LOC~~ SOLN
0.0000 [IU] | Freq: Three times a day (TID) | SUBCUTANEOUS | Status: DC
  Administered 2019-09-10: 13:00:00 3 [IU] via SUBCUTANEOUS
  Administered 2019-09-10 (×2): 5 [IU] via SUBCUTANEOUS
  Administered 2019-09-11 (×3): 2 [IU] via SUBCUTANEOUS
  Administered 2019-09-11 – 2019-09-14 (×2): 3 [IU] via SUBCUTANEOUS
  Administered 2019-09-14 – 2019-09-15 (×3): 2 [IU] via SUBCUTANEOUS
  Administered 2019-09-15: 3 [IU] via SUBCUTANEOUS
  Administered 2019-09-16 – 2019-09-18 (×3): 2 [IU] via SUBCUTANEOUS

## 2019-09-10 MED ORDER — OSMOLITE 1.5 CAL PO LIQD
1000.0000 mL | ORAL | Status: DC
  Administered 2019-09-17: 1000 mL
  Filled 2019-09-10 (×14): qty 1000

## 2019-09-10 MED ORDER — PRO-STAT SUGAR FREE PO LIQD
30.0000 mL | Freq: Two times a day (BID) | ORAL | Status: DC
  Administered 2019-09-10 – 2019-09-18 (×18): 30 mL
  Filled 2019-09-10 (×17): qty 30

## 2019-09-10 NOTE — Progress Notes (Signed)
[facility]                                                                              Patient Demographics  Andres [name], is a 79 y.o. male, DOB - October 29, 1940, [id number]  Admit date - 09/09/2019   Admitting Physician Flora Lipps, MD  Outpatient Primary MD for the patient is [name], MD  Outpatient specialists:   LOS - 1  days    No chief complaint on file.      Brief summary  Andres [name] is a 79 y.o. 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 He presents with progressively worsening shortness of breath of respiratory stress due to right lower lobe pneumonia outpatient x-ray with leukocytosis of 15.7K associated with hypoxia and decreased mental status. Patient admitted for acute respiratory failure due to pneumonia with acute metabolic encephalopathy.  Family do not want aggressive care for the father including cats consult to feeding for now and is DNR, requesting comfort care    Assessment & Plan    Principal Problem:   Respiratory failure, acute ([place]) Active Problems:   Diabetes ([place])   Hyperlipidemia   Essential hypertension   Pneumonia, aspiration ([place])   Acute ischemic stroke ()   Pneumonia  Acute respiratory failure hypoxia secondary to aspiration pneumonia Supportive care including submental oxygen to keep O2 sat more than 92%   Aspiration pneumonia Continue antibiotic will supportive care   Acute metabolic encephalopathy Multifactorial trilogy including respiratory failure with renal dysfunction and volume depletion Supportive care  Dysphagia, status post CVA Patient n.p.o. on tube feeding  Type 2 diabetes mellitus Uncontrolled Continue treatment with adjustment as needed for  glycemic control  Hyponatremia Improving with free water and hypotonic fluids Monitor sodium levels next Acute on chronic CKD 3 Continue IV fluids and supportive care monitor renal function with a 2 lites  Code Status: DNR DVT Prophylaxis: Heparin Family Communication: Discussed in detail with daughter from [place], all imaging results, lab results explained to daughter  Disposition Plan: Hospice  Time Spent in minutes 35 minutes  Procedures:    Consultants:     Antimicrobials:      Medications  Scheduled Meds: . amLODipine  5 mg Oral Daily  . aspirin  81 mg Per Tube Daily  . atorvastatin  40 mg Per Tube q1800  . chlorhexidine  15 mL Mouth Rinse BID  . docusate sodium  100 mg Oral BID  . feeding supplement (PRO-STAT SUGAR FREE 64)  30 mL Per Tube BID  . heparin  5,000 Units Subcutaneous Q8H  . insulin aspart  0-15 Units Subcutaneous TID AC & HS  . levETIRAcetam  500 mg Per Tube BID  . mouth rinse  15 mL Mouth Rinse q12n4p  . methylphenidate  5 mg Per Tube Daily  . metoprolol tartrate  50 mg Per Tube BID   Continuous Infusions: . ampicillin-sulbactam (UNASYN) IV 1.5 g (09/10/19 0531)  . dextrose 75 mL/hr at  09/10/19 0533  . feeding supplement (OSMOLITE 1.5 CAL)     PRN Meds:.ipratropium-albuterol, ondansetron **OR** ondansetron (ZOFRAN) IV, polyethylene glycol   Antibiotics   Anti-infectives (From admission, onward)   Start     Dose/Rate Route Frequency Ordered Stop   09/09/19 1730  ampicillin-sulbactam (UNASYN) 1.5 g in sodium chloride 0.9 % 100 mL IVPB     1.5 g 200 mL/hr over 30 Minutes Intravenous Every 12 hours 09/09/19 1713          Subjective:   Samuele Sutcliffe was seen and examined today.  Unable to volunteer.  Family requests comfort care and tube feeding telemetry discontinued.  Palliative medicine consulted  Objective:   Vitals:   09/10/19 0542 09/10/19 0735 09/10/19 1004 09/10/19 1542  BP: 112/60  (!) 115/59 118/65  Pulse: 81  85 89    Resp: (!) 28  (!) 27 (!) 25  Temp: (!) 97.1 F (36.2 C)  98.4 F (36.9 C) 98.1 F (36.7 C)  TempSrc: Axillary  Axillary Axillary  SpO2: 98% 98% 100% 98%  Weight:        Intake/Output Summary (Last 24 hours) at 09/10/2019 1639 Last data filed at 09/10/2019 1300 Gross per 24 hour  Intake 935.09 ml  Output 200 ml  Net 735.09 ml     Wt Readings from Last 3 Encounters:  09/10/19 90.8 kg  09/08/19 90.2 kg  08/19/19 105 kg     Exam  General: NAD  HEENT: NCAT,MMM  Neck: SUPPLE, (-) JVD  Cardiovascular: RRR, (-) GALLOP, (-) MURMUR  Respiratory: Right lower lung field rhonchorous breath sounds  Gastrointestinal: SOFT, (-) DISTENSION, BS(+), (_) TENDERNESS  Ext: (-) CYANOSIS, (-) EDEMA  Neuro: Lethargic, mildly arousable     Data Reviewed:  I have personally reviewed following labs and imaging studies  Micro Results Recent Results (from the past 240 hour(s))  MRSA PCR Screening     Status: None   Collection Time: 09/06/19  1:14 AM   Specimen: Nasal Mucosa; Nasopharyngeal  Result Value Ref Range Status   MRSA by PCR NEGATIVE NEGATIVE Final    Comment:        The GeneXpert MRSA Assay (FDA approved for NASAL specimens only), is one component of a comprehensive MRSA colonization surveillance program. It is not intended to diagnose MRSA infection nor to guide or monitor treatment for MRSA infections. Performed at [facility], [place] [address] [place], [place] [address]     Radiology Reports CT ABDOMEN WO CONTRAST  Result Date: 08/31/2019 CLINICAL DATA:  Cerebral infarction and assessment for potential gastrostomy tube placement. EXAM: CT ABDOMEN WITHOUT CONTRAST TECHNIQUE: Multidetector CT imaging of the abdomen was performed following the standard protocol without IV contrast. COMPARISON:  CT of the abdomen and pelvis on 07/21/2013 FINDINGS: Lower chest: Mild ground-glass opacity in the posterior left lower lobe and medial right lower lobe may be related  to atelectasis or subtle infiltrate. Early pneumonia/atypical infection is not excluded. Hepatobiliary: No focal liver abnormality is seen. No gallstones, gallbladder wall thickening, or biliary dilatation. Pancreas: Unremarkable. No pancreatic ductal dilatation or surrounding inflammatory changes. Spleen: Normal in size without focal abnormality. Adrenals/Urinary Tract: Adrenal glands are unremarkable. Kidneys are normal, without renal calculi, focal lesion, or hydronephrosis. Stomach/Bowel: Moderate-sized hiatal hernia. Feeding tube extends to the level of the mid to distal stomach. Below the diaphragm, the rest of the stomach is shielded almost entirely by the left lobe of the liver and the splenic flexure of the colon also abuts the  left lobe liver tip. This anatomy is highly unfavorable to gastrostomy tube placement percutaneously. The rest of the visualized abdominal bowel loops demonstrate no evidence of obstruction or ileus. No free air is identified. No inflammatory process is seen. Vascular/Lymphatic: No significant vascular findings are present. No enlarged abdominal lymph nodes. Other: No ascites, anasarca or focal fluid collections. Musculoskeletal: No acute or significant osseous findings. IMPRESSION: 1. Unfavorable anatomy for percutaneous gastrostomy tube placement. There is a moderate-sized hiatal hernia and the rest of the stomach is shielded by the left lobe of the liver. There also is encroachment of the splenic flexure of the colon abutting the tip of the left lobe of the liver. If the patient is in need for long-term enteral nutrition, consider surgical consultation for either surgical gastrostomy or jejunostomy. 2. Mild ground-glass opacity in both lower lobes at the visualized lung bases. Although this may be on the basis of atelectasis, subtle infection including atypical viral infection is not excluded. Electronically Signed   By: Aletta Edouard M.D.   On: 08/31/2019 13:11   DG Chest 1  View  Result Date: 08/23/2019 CLINICAL DATA:  Lethargy. EXAM: CHEST  1 VIEW COMPARISON:  August 14, 2019 FINDINGS: A nasogastric tube is seen with its distal end extending below the level of the diaphragm. Multiple sternal wires and vascular clips are seen. There is no evidence of acute infiltrate, pleural effusion or pneumothorax. The heart size and mediastinal contours are within normal limits. The visualized skeletal structures are unremarkable. IMPRESSION: 1. Evidence of prior median sternotomy/CABG. 2. No acute or active cardiopulmonary disease. Electronically Signed   By: Virgina Norfolk M.D.   On: 08/23/2019 19:15   DG Chest 2 View  Result Date: 09/08/2019 CLINICAL DATA:  Shortness of breath. EXAM: CHEST - 2 VIEW COMPARISON:  Chest x-ray 08/23/2019 FINDINGS: The cardiac silhouette, mediastinal and hilar contours are within normal limits and stable. 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 Right lower lobe airspace opacity consistent with pneumonia. No pleural effusions or pulmonary edema. IMPRESSION: Right lower lobe pneumonia. Electronically Signed   By: Marijo Sanes M.D.   On: 09/08/2019 05:10   CT HEAD WO CONTRAST  Result Date: 08/26/2019 CLINICAL DATA:  Cerebral hemorrhage suspected. Additional history provided: Left-sided weakness with dysarthria and dysphagia secondary to acute right MCA infarction. EXAM: CT HEAD WITHOUT CONTRAST TECHNIQUE: Contiguous axial images were obtained from the base of the skull through the vertex without intravenous contrast. COMPARISON:  Head CT 08/14/2019 FINDINGS: Brain: Again demonstrated is a large right MCA vascular territory subacute infarct. Portions of the infarcted territory demonstrate fogging on the current examination. There has been a significant interval decrease in associated swelling and mass effect. No evidence of hemorrhagic conversion. No new demarcated infarct is identified. No midline shift, hydrocephalus or extra-axial fluid  collection. Background chronic small vessel ischemic disease, unchanged. Mild generalized parenchymal atrophy. Vascular: No definite hyperdense vessel. However, a short segment occlusion was demonstrated at the right M1/M2 junction on prior CTA head/neck 08/09/2019. Skull: Normal. Negative for fracture or focal lesion. Sinuses/Orbits: Visualized orbits demonstrate no acute abnormality. No significant paranasal sinus disease or mastoid effusion at the imaged levels. Other: Redemonstrated right suboccipital lipoma. 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 No evidence of hemorrhagic conversion. Significant interval decrease in associated mass effect as compared to 08/14/2019. No new demarcated infarct is identified. Stable background generalized parenchymal atrophy and chronic small vessel ischemic disease. Electronically Signed   By: Kellie Simmering DO   On:  08/26/2019 13:35   CT HEAD WO CONTRAST  Result Date: 08/14/2019 CLINICAL DATA:  Stroke follow-up. Worsening stroke symptoms. EXAM: CT HEAD WITHOUT CONTRAST TECHNIQUE: Contiguous axial images were obtained from the base of the skull through the vertex without intravenous contrast. COMPARISON:  08/09/2019 FINDINGS: Brain: Slight expansion of right MCA territory infarct, now involving more of the insula and frontal operculum. There is white matter hypoattenuation consistent with chronic ischemic microangiopathy. No hemorrhage. No herniation or other significant mass effect. No hydrocephalus. Vascular: Atherosclerotic calcification of the internal carotid arteries at the skull base. No abnormal hyperdensity of the major intracranial arteries or dural venous sinuses. Skull: The visualized skull base, calvarium and extracranial soft tissues are normal. Sinuses/Orbits: No fluid levels or advanced mucosal thickening of the visualized paranasal sinuses. No mastoid or middle ear effusion. The orbits are normal. Other: None. IMPRESSION:  Progression of right MCA territory infarct, extending further into the insula and frontal operculum without hemorrhage or herniation. Electronically Signed   By: Ulyses Jarred M.D.   On: 08/14/2019 02:16   DG CHEST PORT 1 VIEW  Result Date: 08/14/2019 CLINICAL DATA:  Fever. Shortness of breath. EXAM: PORTABLE CHEST 1 VIEW COMPARISON:  08/09/2019 and 09/05/2018 FINDINGS: Feeding tube tip is in the region of the duodenal bulb. Heart size and pulmonary vascularity are normal. Minimal residual atelectasis at the left base medially. The lungs are otherwise clear. CABG. No bone abnormality. IMPRESSION: 1. Minimal residual atelectasis at the left lung base. Lung aeration has improved bilaterally. 2. Feeding tube tip is in the duodenal bulb. Electronically Signed   By: Lorriane Shire M.D.   On: 08/14/2019 10:06   DG Abd Portable 1V  Result Date: 08/15/2019 CLINICAL DATA:  Enteric tube placement EXAM: PORTABLE ABDOMEN - 1 VIEW COMPARISON:  08/09/2019 FINDINGS: Feeding tube enters the stomach and turned to the right. Tip appears to be in the region of the duodenal bulb. Normal bowel gas pattern. IMPRESSION: Feeding tube tip in the region of the duodenal bulb. Electronically Signed   By: Franchot Gallo M.D.   On: 08/15/2019 15:33   EEG adult  Result Date: 08/14/2019 Lora Havens, MD     08/14/2019 11:45 AM Patient Name: NASHID MENZE MRN: [id number] Epilepsy Attending: Lora Havens Referring Physician/Provider: Dr Roland Rack Date: 08/13/2018 Duration: 25.56 mins Patient history: 67 M with history of seziure who presented with stroke with and frequent left-sided twitching concerning for seizure. EEG to evaluate for seizure. Level of alertness: asleep/sedated AEDs during EEG study: LEV Technical aspects: This EEG study was done with scalp electrodes positioned according to the 10-20 International system of electrode placement. Electrical activity was acquired at a sampling rate of 500Hz  and reviewed  with a high frequency filter of 70Hz  and a low frequency filter of 1Hz . EEG data were recorded continuously and digitally stored. DESCRIPTION: EEG showed continuous generalized and lateralized right hemisphere low amplitude 3-5hz  theta-delta slowing. Hyperventilation and photic stimulation were not performed. ABNORMALITY - Continuous slow, generalized and lateralized right hemisphere IMPRESSION: This study is suggestive of cortical dysfunction in right hemisphere likely secondary to underlying infarct as well as severe diffuse encephalopathy, non specific to etiology. No seizures or epileptiform discharges were seen throughout the recording. Priyanka Barbra Sarks   Overnight EEG with video  Result Date: 08/15/2019 Lora Havens, MD     08/15/2019  1:51 PM Patient Name: KEONTAY BRECHBIEL MRN: [id number] Epilepsy Attending: Lora Havens Referring Physician/Provider: Dr Karena Addison rule Duration: 08/13/2018 1128 to  08/15/2019 1250  Patient history: 39 M with history of seziure who presented with stroke with and frequent left-sided twitching concerning for seizure. EEG to evaluate for seizure.  Level of alertness:  Awake, lethragic  AEDs during EEG study: LEV  Technical aspects: This EEG study was done with scalp electrodes positioned according to the 10-20 International system of electrode placement. Electrical activity was acquired at a sampling rate of 500Hz  and reviewed with a high frequency filter of 70Hz  and a low frequency filter of 1Hz . EEG data were recorded continuously and digitally stored.  DESCRIPTION:  During awake state, no clear posterior dominant rhythm is seen. EEG showed continuous generalized and lateralized right hemisphere low amplitude 3-5hz  theta-delta slowing. Hyperventilation and photic stimulation were not performed.  ABNORMALITY - Continuous slow, generalized and lateralized right hemisphere  IMPRESSION: This study is suggestive of cortical dysfunction in right hemisphere likely  secondary to underlying infarct as well as severe diffuse encephalopathy, non specific to etiology. No seizures or epileptiform discharges were seen throughout the recording.  Lora Havens    Lab Data:  CBC: Recent Labs  Lab 09/08/19 0545 09/09/19 1658 09/10/19 0528  WBC 15.7* 7.5 7.1  HGB 14.8 10.5* 10.7*  HCT 46.1 32.5* 32.8*  MCV 102.4* 103.2* 100.6*  PLT 225 156 [id number]*   Basic Metabolic Panel: Recent Labs  Lab 09/07/19 1700 09/08/19 0545 09/09/19 0949 09/09/19 1658 09/10/19 0528  NA  --  150* 143  --  139  K  --  5.1 5.1  --  4.5  CL  --  112* 105  --  101  CO2  --  26 25  --  27  GLUCOSE  --  256* 386*  --  219*  BUN  --  66* 85*  --  80*  CREATININE  --  2.27* 3.31* 3.05* 2.61*  CALCIUM  --  10.4* 9.4  --  9.2  MG 2.6* 2.3  --   --  2.3  PHOS 5.2* 4.1  --   --  4.8*   GFR: Estimated Creatinine Clearance: 24.3 mL/min (A) (by C-G formula based on SCr of 2.61 mg/dL (H)). Liver Function Tests: Recent Labs  Lab 09/10/19 0528  AST 51*  ALT 22  ALKPHOS 64  BILITOT 0.9  PROT 5.4*  ALBUMIN 2.3*   No results for input(s): LIPASE, AMYLASE in the last 168 hours. No results for input(s): AMMONIA in the last 168 hours. Coagulation Profile: No results for input(s): INR, PROTIME in the last 168 hours. Cardiac Enzymes: No results for input(s): CKTOTAL, CKMB, CKMBINDEX, TROPONINI in the last 168 hours. BNP (last 3 results) No results for input(s): PROBNP in the last 8760 hours. HbA1C: No results for input(s): HGBA1C in the last 72 hours. CBG: Recent Labs  Lab 09/09/19 1142 09/09/19 1620 09/09/19 2143 09/10/19 0742 09/10/19 1142  GLUCAP 312* 277* 232* 204* 195*   Lipid Profile: No results for input(s): CHOL, HDL, LDLCALC, TRIG, CHOLHDL, LDLDIRECT in the last 72 hours. Thyroid Function Tests: No results for input(s): TSH, T4TOTAL, FREET4, T3FREE, THYROIDAB in the last 72 hours. Anemia Panel: No results for input(s): VITAMINB12, FOLATE, FERRITIN, TIBC,  IRON, RETICCTPCT in the last 72 hours. Urine analysis:    Component Value Date/Time   COLORURINE YELLOW 08/23/2019 1435   APPEARANCEUR CLEAR 08/23/2019 1435   LABSPEC 1.017 08/23/2019 1435   PHURINE 6.0 08/23/2019 1435   GLUCOSEU NEGATIVE 08/23/2019 [place] 12/08/2018 1017   [place] 08/23/2019 1435  BILIRUBINUR NEGATIVE 08/23/2019 1435   KETONESUR NEGATIVE 08/23/2019 1435   PROTEINUR 30 (A) 08/23/2019 1435   UROBILINOGEN 0.2 12/08/2018 1017   NITRITE NEGATIVE 08/23/2019 1435   LEUKOCYTESUR NEGATIVE 08/23/2019 1435     Benito Mccreedy M.D. [facility] 09/10/2019, 4:39 PM  Pager: [phone] Between 7am to 7pm - call Pager - [phone]  After 7pm go to [email or web address] - password [email or web address]  Call night coverage person covering after 7pm

## 2019-09-10 NOTE — Progress Notes (Addendum)
Initial Nutrition Assessment  DOCUMENTATION CODES:   Not applicable  INTERVENTION:   Tube feeding:  -Osmolite 1.5 @ 60 ml/hr via G-tube (1440 ml) -30 ml Prostat BID  Provides: 2360 kcals, 120 grams protein, 1097 ml free water. Meets 100% of needs.   NUTRITION DIAGNOSIS:   Inadequate oral intake related to inability to eat as evidenced by NPO status.  GOAL:   Patient will meet greater than or equal to 90% of their needs  MONITOR:   Weight trends, Labs, I & O's, TF tolerance, Skin  REASON FOR ASSESSMENT:   Consult Enteral/tube feeding initiation and management  ASSESSMENT:   Patient with PMH significant for HTN, seizure disorder, CKD III, HLD, and DM. Recently admitted for R MCA with resulting L sided weakness/dysphagia. Has G-tube placed and was transferred to inpatient rehab. Due to worsening mental status and hypoxia pt was readmitted to hospital.   RD working remotely.  Remains NPO. During patients last admission he was able to tolerate bolus feedings via Cortrak. Transitioned to continuous feedings after open G-tube placement. Received Osmolite 1.5 @ 70 ml/hr for 20 hours + 30 ml Prostat BID. Will change to 24 continuous feeding until patient's symptoms improve. May consider going back to bolus in the future to provide more autonomy.   Noted elevated CBGs. Pt currently on D5. Suspect improvement with continuous feedings and insulin regimen adjustment.   Patient showed to weigh 105 kg on 1/15 and 90.8 kg this admission. Unable to determine how much is dry weight loss vs fluid fluctuation, but suspect he has lost dry weight.  Will need to perform NFPE if possible.   I/O: +735 ml since admit  UOP: 200 ml x 24 hrs   Drips: D5 @ 75 ml/hr  Medications: colace, SS novolog Labs: Cr 2.61-trending down Phosphorus 4.8 (H) CBG [id number]  Diet Order:   Diet Order            Diet NPO time specified  Diet effective now              EDUCATION NEEDS:   Not appropriate  for education at this time  Skin:  Skin Assessment: Skin Integrity Issues: Skin Integrity Issues:: Incisions, Other (Comment) Incisions: abdomen Other: MASD- buttocks, groin  Last BM:  2/3  Height:   Ht Readings from Last 1 Encounters:  09/02/19 5\' 7"  (1.702 m)    Weight:   Wt Readings from Last 1 Encounters:  09/10/19 90.8 kg    Ideal Body Weight:  67.3 kg  BMI:  Body mass index is 31.35 kg/m.  Estimated Nutritional Needs:   Kcal:  2150-2350 kcal  Protein:  105-120 grams  Fluid:  >/= 2 L/day  Mariana Single RD, LDN Clinical Nutrition Pager listed in [place]

## 2019-09-10 NOTE — Progress Notes (Signed)
RT assessment was done this AM, and while score remains higher, I do not feel that the patient is receiving any benefit from these treatments. BBS rhonchi, but no wheezes or SOB noted that would be helped with a bronchodilator. Patient also became more agitated when I placed mask on face. Will change to PRN and give as needed

## 2019-09-10 NOTE — Progress Notes (Signed)
Speech Language Pathology Discharge Patient Details Name: Andres [name] MRN: [id number] DOB: 04-15-1941 Today's Date: 09/10/2019 Time:  -     Patient discharged from SLP services secondary to medical decline - will need to re-order SLP to resume therapy services.  SLP was unable to arouse- notes seen of this decline. Will sign off. Please reconsult if needed in the future.   Please see latest therapy progress note for current level of functioning and progress toward goals.    Progress and discharge plan discussed with patient and/or caregiver: Patient unable to participate in discharge planning and no caregivers available  GO     [name] 09/10/2019, 10:28 AM

## 2019-09-11 DIAGNOSIS — R1310 Dysphagia, unspecified: Secondary | ICD-10-CM

## 2019-09-11 DIAGNOSIS — I639 Cerebral infarction, unspecified: Secondary | ICD-10-CM

## 2019-09-11 DIAGNOSIS — R0609 Other forms of dyspnea: Secondary | ICD-10-CM

## 2019-09-11 DIAGNOSIS — Z9189 Other specified personal risk factors, not elsewhere classified: Secondary | ICD-10-CM

## 2019-09-11 DIAGNOSIS — Z66 Do not resuscitate: Secondary | ICD-10-CM

## 2019-09-11 DIAGNOSIS — Z515 Encounter for palliative care: Secondary | ICD-10-CM

## 2019-09-11 LAB — GLUCOSE, CAPILLARY
Glucose-Capillary: 200 mg/dL — ABNORMAL HIGH (ref 70–99)
Glucose-Capillary: 154 mg/dL — ABNORMAL HIGH (ref 70–99)
Glucose-Capillary: 122 mg/dL — ABNORMAL HIGH (ref 70–99)
Glucose-Capillary: 130 mg/dL — ABNORMAL HIGH (ref 70–99)

## 2019-09-11 MED ORDER — MORPHINE SULFATE (PF) 2 MG/ML IV SOLN
1.0000 mg | INTRAVENOUS | Status: DC | PRN
  Administered 2019-09-11 – 2019-09-15 (×9): 1 mg via INTRAVENOUS
  Filled 2019-09-11 (×9): qty 1

## 2019-09-11 NOTE — Consult Note (Signed)
Consultation Note Date: 09/11/2019   Patient Name: Andres [name]  DOB: 1940/09/17  MRN: [id number]  Age / Sex: 79 y.o., male  PCP: [name], MD Referring Physician: Benito Mccreedy, MD  Reason for Consultation: Establishing goals of care and Psychosocial/spiritual support  HPI/Patient Profile: 79 y.o. male  admitted on 09/09/2019 with  past medical history of hypertension, seizure disorder, chronic kidney disease stage III, hyperlipidemia, coronary artery disease, was recently admitted to the hospital for right MCA stroke with left-sided weakness and dysphagia.    Patient did have a gastrostomy tube placed  and was  transferred to CIR for rehabilitation.  Patient was having worsening shortness of breath and respiratory distress since yesterday and was put on nonrebreather mask.    Chest x-ray performed showed new right lower lobe pneumonia with elevated WBC count of 15.7.  He did have hyponatremia with a sodium of 150.  There was a increase in the creatinine levels as well so the medical team was consulted yesterday.  Due to worsening mental status and hypoxia, patient was considered for readmission to the hospital from rehabilitation unit.   Currently he is unresponsive, unable to follow commands and appears generally uncomfortable.  He has audible throat secretions   Palliative was consulted to establish GOCs.    Family face treatment option decisions, advanced directive decisions and anticipatory care needs.   Clinical Assessment and Goals of Care:   This NP Wadie Lessen reviewed medical records, received report from team, assessed the patient and then meet at the patient's bedside along with his daugther/ Aldean Ast  to discuss diagnosis, prognosis, GOC, EOL wishes disposition and options.  Concept of Palliative Care was discussed  Created space and opportunity for family to explore their  thoughts and feelings regarding the patient's current medical situation.  She verbalized many frustrations with overall experience during her father's hospital stay.  Directed her to Patient Experience and offered emotional support.  We discussed the situation today and I attempted to establish values and GOCs important to this patient and famiy at this time,  in this situation.  We  reviewed his medical history and events of the past few months  A detailed discussion was had today regarding advanced directives.  Concepts specific to code status, artifical feeding and hydration, continued IV antibiotics and rehospitalization was had.  The difference between a aggressive medical intervention path  and a palliative comfort care path for this patient at this time was had.    MOST form introduced and completed but not signed at this time (we will re-address tomorrow)      Hard Choices booklet left for review  I shared my concern that the patient shows signs of transitioning at EOL and that anything could happen at any time  Natural trajectory and expectations at EOL were discussed.  Questions and concerns addressed.   Family encouraged to call with questions or concerns.    PMT will continue to support holistically.    Plan is to re-meet with daughter tomorrow  at 2:00   A copy was made of his AD brought in today by daughter.  She is the first listed HPOA--placed in hard chart     SUMMARY OF RECOMMENDATIONS    Code Status/Advance Care Planning:  DNR   Symptom Management:   Morphine 1 mg IV every 3 hrs prn for dyspnea or pain   (discussed in detail and daughter agrees with this interventions, she is very apprehensive to utilize opioids)    Palliative Prophylaxis:  - aspiration risk, pain assessment  Additional Recommendations (Limitations, Scope, Preferences):  - family/daughter is unable to make a shift to full comfort at this time    - hold on tube feeds for now, may utilize for  medications    -continue IV fluids and IV antibiotics    -symptom management as discussed above  Psycho-social/Spiritual:   Desire for further Chaplaincy support:no, declined  Additional Recommendations: education on hospice and comfort care at EOL  Prognosis:   Hours to days  Discharge Planning: expect hospital death     Primary Diagnoses: Present on Admission: . Hyperlipidemia . Essential hypertension . Respiratory failure, acute ([place]) . Pneumonia, aspiration ([place]) . Acute ischemic stroke ([place]) . Pneumonia   I have reviewed the medical record, interviewed the patient and family, and examined the patient. The following aspects are pertinent.  Past Medical History:  Diagnosis Date  . ALLERGIC RHINITIS   . Benign prostatic hypertrophy   . 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 Last nuc 2012 negative. 07/21/2013  . Carpal tunnel syndrome, bilateral   . Cataracts, bilateral   . Coronary artery disease    Dr. Claiborne Billings; 2D ECHO, 12/11/2011 - EF 50-55%, normal; NUCLEAR STRESS TEST, 10/16/2010 - no evidemce of inducible ischemia  . Diabetes mellitus type II   . Diverticulosis of colon   . GERD (gastroesophageal reflux disease)   . H/O hiatal hernia   . Head mass   . Hepatitis   . Hx of   sessile serrated colonic polyp 02/27/2015  . Hyperlipidemia   . Hypertension   . Inguinal hernia, Rt reduced, Lt present 07/21/2013  . Low back pain   . Mitral valve prolapse 11/24/2013   By echo dec 2014  . Nephrolithiasis   . Obesity, morbid ([place])   . Occlusion and stenosis of carotid artery without mention of cerebral infarction    CAROTID DOPPLER, 03/18/2012 - srable, mild, hard, plaque noted, bilaterally  . Stroke  [facility])    Social History   Socioeconomic History  . Marital status: Widowed    Spouse name: widowed  . Number of children: 3  . Years of education: Not on file  . Highest education level: Not on file  Occupational History  . Occupation: retired     Fish farm manager: [employer]  Tobacco Use  . Smoking status: Former Smoker    Types: Cigarettes    Quit date: 12/20/1989    Years since quitting: 29.7  . Smokeless tobacco: Never Used  Substance and Sexual Activity  . Alcohol use: No    Alcohol/week: 0.0 standard drinks  . Drug use: No  . Sexual activity: Not on file  Other Topics Concern  . Not on file  Social History Narrative  . Not on file   Social Determinants of Health   Financial Resource Strain:   . Difficulty of Paying Living Expenses: Not on file  Food Insecurity:   . Worried About Charity fundraiser in the Last Year: Not on file  .  Ran Out of Food in the Last Year: Not on file  Transportation Needs:   . Lack of Transportation (Medical): Not on file  . Lack of Transportation (Non-Medical): Not on file  Physical Activity:   . Days of Exercise per Week: Not on file  . Minutes of Exercise per Session: Not on file  Stress:   . Feeling of Stress : Not on file  Social Connections:   . Frequency of Communication with Friends and Family: Not on file  . Frequency of Social Gatherings with Friends and Family: Not on file  . Attends Religious Services: Not on file  . Active Member of Clubs or Organizations: Not on file  . Attends Archivist Meetings: Not on file  . Marital Status: Not on file   Family History  Problem Relation Age of Onset  . Heart attack Mother   . Diabetes Mother   . Heart attack Brother   . Heart attack Maternal Grandmother   . Hypertension Maternal Grandmother   . Pancreatic cancer Brother   . Kidney disease Brother   . Hypertension Son   . Kidney disease Son        ?  Marland Kitchen Alzheimer's disease Father   . Diabetes Brother   . Diabetes Sister    Scheduled Meds: . amLODipine  5 mg Oral Daily  . aspirin  81 mg Per Tube Daily  . atorvastatin  40 mg Per Tube q1800  . chlorhexidine  15 mL Mouth Rinse BID  . docusate sodium  100 mg Oral BID  . feeding supplement (PRO-STAT SUGAR FREE 64)  30 mL Per  Tube BID  . heparin  5,000 Units Subcutaneous Q8H  . insulin aspart  0-15 Units Subcutaneous TID AC & HS  . levETIRAcetam  500 mg Per Tube BID  . mouth rinse  15 mL Mouth Rinse q12n4p  . methylphenidate  5 mg Per Tube Daily  . metoprolol tartrate  50 mg Per Tube BID   Continuous Infusions: . ampicillin-sulbactam (UNASYN) IV 1.5 g (09/11/19 0542)  . dextrose 75 mL/hr at 09/10/19 0533  . feeding supplement (OSMOLITE 1.5 CAL)     PRN Meds:.ipratropium-albuterol, ondansetron **OR** ondansetron (ZOFRAN) IV, polyethylene glycol Medications Prior to Admission:  Prior to Admission medications   Medication Sig Start Date End Date Taking? Authorizing Provider  acetaminophen (TYLENOL) 325 MG tablet Take 2 tablets (650 mg total) by mouth every 4 (four) hours as needed for mild pain (or temp > 37.5 C (99.5 F)). 09/09/19  Yes Angiulli, Lavon Paganini, PA-C  Ascorbic Acid (VITAMIN C) 1000 MG tablet Take 1,000 mg by mouth daily.   Yes [provider]  aspirin 81 MG chewable tablet Place 1 tablet (81 mg total) into feeding tube daily. 08/19/19  Yes Aline August, MD  atorvastatin (LIPITOR) 40 MG tablet Take 40 mg by mouth daily.   Yes [provider]  clopidogrel (PLAVIX) 75 MG tablet Take 75 mg by mouth daily.   Yes [provider]  folic acid (FOLVITE) 1 MG tablet Take 1 mg by mouth daily.   Yes [provider]  hydrochlorothiazide (MICROZIDE) 12.5 MG capsule Take 12.5 mg by mouth every evening.   Yes [provider]  insulin aspart (NOVOLOG) 100 UNIT/ML injection Inject 0-20 Units into the skin every 4 (four) hours. Patient taking differently: Inject 0-20 Units into the skin daily as needed for high blood sugar. Unknown sliding scale 09/09/19  Yes Angiulli, Lavon Paganini, PA-C  levETIRAcetam (KEPPRA) 750  MG tablet Take 1 tablet (750 mg total) by mouth 2 (two) times daily. Per tube 08/19/19  Yes Aline August, MD  losartan (COZAAR) 50 MG tablet Take 50 mg by mouth daily.    Yes [provider]  metoprolol tartrate (LOPRESSOR) 50 MG tablet Place 1 tablet (50 mg total) into feeding tube 2 (two) times daily. 08/19/19  Yes Aline August, MD  omega-3 acid ethyl esters (LOVAZA) 1 g capsule Take 1 g by mouth daily.   Yes [provider]  tamsulosin (FLOMAX) 0.4 MG CAPS capsule Take 1 capsule (0.4 mg total) by mouth daily. Per tube 08/19/19  Yes Aline August, MD  albuterol (PROVENTIL) (2.5 MG/3ML) 0.083% nebulizer solution Take 3 mLs (2.5 mg total) by nebulization every 4 (four) hours as needed for wheezing or shortness of breath. Patient not taking: Reported on 09/09/2019 09/09/19   Angiulli, Lavon Paganini, PA-C  Amino Acids-Protein Hydrolys (FEEDING SUPPLEMENT, PRO-STAT SUGAR FREE 64,) LIQD Place 30 mLs into feeding tube 2 (two) times daily. Patient not taking: Reported on 09/09/2019 09/09/19   Angiulli, Lavon Paganini, PA-C  amLODipine (NORVASC) 5 MG tablet Take 1 tablet (5 mg total) by mouth daily. Patient not taking: Reported on 09/09/2019 09/10/19   Cathlyn Parsons, PA-C  Ampicillin-Sulbactam 3 g in sodium chloride 0.9 % 100 mL Inject 3 g into the vein every 12 (twelve) hours. Patient not taking: Reported on 09/09/2019 09/10/19   Cathlyn Parsons, PA-C  dextrose 5 % solution Inject 50 mLs into the vein continuous. Patient not taking: Reported on 09/09/2019 09/09/19   Angiulli, Lavon Paganini, PA-C  ipratropium-albuterol (DUONEB) 0.5-2.5 (3) MG/3ML SOLN Take 3 mLs by nebulization every 4 (four) hours. Patient not taking: Reported on 09/09/2019 09/09/19   Cathlyn Parsons, PA-C  Nutritional Supplements (FEEDING SUPPLEMENT, JEVITY 1.2 CAL,) LIQD Place 1,000 mLs into feeding tube continuous. Patient not taking: Reported on 09/09/2019 08/19/19   Aline August, MD  oxyCODONE (ROXICODONE) 5 MG/5ML solution Take 5 mLs (5 mg total) by mouth every 6 (six) hours as needed for moderate pain. Patient not taking: Reported on 09/09/2019 09/09/19   Angiulli, Lavon Paganini, PA-C  polyethylene glycol (MIRALAX /  GLYCOLAX) 17 g packet Place 17 g into feeding tube daily as needed for moderate constipation. Patient not taking: Reported on 09/09/2019 08/19/19   Aline August, MD  Water For Irrigation, Sterile (FREE WATER) SOLN Place 350 mLs into feeding tube every 4 (four) hours. Patient not taking: Reported on 09/09/2019 09/09/19   Cathlyn Parsons, PA-C   No Known Allergies Review of Systems  Unable to perform ROS: Acuity of condition    Physical Exam Constitutional:      Appearance: He is normal weight. He is ill-appearing.     Interventions: Nasal cannula in place.  HENT:     Mouth/Throat:     Comments: Audible throat secretions Cardiovascular:     Rate and Rhythm: Normal rate.  Skin:    General: Skin is warm and dry.  Neurological:     Mental Status: He is lethargic.     Vital Signs: BP 116/71 (BP Location: Right Arm)   Pulse 83   Temp 98 F (36.7 C) (Oral)   Resp 15   Wt 90.8 kg   SpO2 100%   BMI 31.35 kg/m  Pain Scale: PAINAD   Pain Score: 0-No pain   SpO2: SpO2: 100 % O2 Device:SpO2: 100 % O2 Flow Rate: .O2 Flow Rate (L/min): 6 L/min  IO: Intake/output summary:  Intake/Output Summary (Last 24 hours) at 09/11/2019 [id number] Last data filed at 09/10/2019 1300 Gross per 24 hour  Intake 0 ml  Output --  Net 0 ml    LBM: Last BM Date: 09/07/19 Baseline Weight: Weight: 90.8 kg Most recent weight: Weight: 90.8 kg     Palliative Assessment/Data: 20 % at best   Discussed with Dr Vista Lawman,  daughter is requesting a call, I gave him her number  Time In: 1230 Time Out: 1345 Time Total: 75 minutes Greater than 50%  of this time was spent counseling and coordinating care related to the above assessment and plan.  Signed by: Wadie Lessen, NP   Please contact Palliative Medicine Team phone at [phone] for questions and concerns.  For individual provider: See Shea Evans

## 2019-09-11 NOTE — Progress Notes (Signed)
[facility]                                                                              Patient Demographics  Andres [name], is a 79 y.o. male, DOB - [birth year], [id number]  Admit date - 09/09/2019   Admitting Physician Flora Lipps, MD  Outpatient Primary MD for the patient is [name], MD  Outpatient specialists:   LOS - 2  days    No chief complaint on file.      Brief summary  Andres [name] is a 79 y.o. 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 He presents with progressively worsening shortness of breath of respiratory stress due to right lower lobe pneumonia outpatient x-ray with leukocytosis of 15.7K associated with hypoxia and decreased mental status. Patient admitted for acute respiratory failure due to pneumonia with acute metabolic encephalopathy.  Family do not want aggressive care for the father including cats consult to feeding for now and is DNR, requesting comfort care    Assessment & Plan    Principal Problem:   Respiratory failure, acute ([place]) Active Problems:   Diabetes ([place])   Hyperlipidemia   Essential hypertension   Pneumonia, aspiration ([place])   Acute ischemic stroke ([place])   Pneumonia  Acute respiratory failure hypoxia secondary to aspiration pneumonia Supportive care including submental oxygen to keep O2 sat more than 92%   Aspiration pneumonia Continue antibiotic will supportive care   Acute metabolic encephalopathy Multifactorial trilogy including respiratory failure with renal dysfunction and volume depletion Supportive care  Dysphagia, status post CVA Patient was n.p.o. on tube feeding -discontinue per family request use of comfort care    Code Status: DNR DVT  Prophylaxis: Heparin Family Communication:    Disposition Plan: Hospice  Time Spent in minutes 25 minutes  Procedures:    Consultants:     Antimicrobials:      Medications  Scheduled Meds: . amLODipine  5 mg Oral Daily  . aspirin  81 mg Per Tube Daily  . atorvastatin  40 mg Per Tube q1800  . chlorhexidine  15 mL Mouth Rinse BID  . docusate sodium  100 mg Oral BID  . feeding supplement (PRO-STAT SUGAR FREE 64)  30 mL Per Tube BID  . heparin  5,000 Units Subcutaneous Q8H  . insulin aspart  0-15 Units Subcutaneous TID AC & HS  . levETIRAcetam  500 mg Per Tube BID  . mouth rinse  15 mL Mouth Rinse q12n4p  . metoprolol tartrate  50 mg Per Tube BID   Continuous Infusions: . ampicillin-sulbactam (UNASYN) IV 1.5 g (09/11/19 0542)  . dextrose 75 mL/hr at 09/10/19 0533  . feeding supplement (OSMOLITE 1.5 CAL)     PRN Meds:.ipratropium-albuterol, morphine injection, ondansetron **OR** ondansetron (ZOFRAN) IV, polyethylene glycol   Antibiotics   Anti-infectives (From admission, onward)   Start     Dose/Rate Route Frequency Ordered Stop   09/09/19 1730  ampicillin-sulbactam (UNASYN) 1.5 g in sodium chloride  0.9 % 100 mL IVPB     1.5 g 200 mL/hr over 30 Minutes Intravenous Every 12 hours 09/09/19 1713          Subjective:   Rasul Garriott was seen and examined today.  Unable to volunteer.    Objective:   Vitals:   09/10/19 1004 09/10/19 1542 09/10/19 2201 09/11/19 0533  BP: (!) 115/59 118/65 110/61 116/71  Pulse: 85 89 94 83  Resp: (!) 27 (!) 25 16 15   Temp: 98.4 F (36.9 C) 98.1 F (36.7 C) 98.9 F (37.2 C) 98 F (36.7 C)  TempSrc: Axillary Axillary Oral Oral  SpO2: 100% 98% 98% 100%  Weight:        Intake/Output Summary (Last 24 hours) at 09/11/2019 1255 Last data filed at 09/10/2019 1300 Gross per 24 hour  Intake 0 ml  Output --  Net 0 ml     Wt Readings from Last 3 Encounters:  09/10/19 90.8 kg  09/08/19 90.2 kg  08/19/19 105 kg      Exam  General: NAD  HEENT: NCAT,MMM  Neck: SUPPLE, (-) JVD  Cardiovascular: RRR, (-) GALLOP, (-) MURMUR  Respiratory: Right lower lung field rhonchorous breath sounds  Gastrointestinal: SOFT, (-) DISTENSION, BS(+), (_) TENDERNESS  Ext: (-) CYANOSIS, (-) EDEMA  Neuro: Lethargic, mildly arousable     Data Reviewed:  I have personally reviewed following labs and imaging studies  Micro Results Recent Results (from the past 240 hour(s))  MRSA PCR Screening     Status: None   Collection Time: 09/06/19  1:14 AM   Specimen: Nasal Mucosa; Nasopharyngeal  Result Value Ref Range Status   MRSA by PCR NEGATIVE NEGATIVE Final    Comment:        The GeneXpert MRSA Assay (FDA approved for NASAL specimens only), is one component of a comprehensive MRSA colonization surveillance program. It is not intended to diagnose MRSA infection nor to guide or monitor treatment for MRSA infections. Performed at [facility], [place] [address], [place], [place] [address]     Radiology Reports CT ABDOMEN WO CONTRAST  Result Date: 08/31/2019 CLINICAL DATA:  Cerebral infarction and assessment for potential gastrostomy tube placement. EXAM: CT ABDOMEN WITHOUT CONTRAST TECHNIQUE: Multidetector CT imaging of the abdomen was performed following the standard protocol without IV contrast. COMPARISON:  CT of the abdomen and pelvis on 07/21/2013 FINDINGS: Lower chest: Mild ground-glass opacity in the posterior left lower lobe and medial right lower lobe may be related to atelectasis or subtle infiltrate. Early pneumonia/atypical infection is not excluded. Hepatobiliary: No focal liver abnormality is seen. No gallstones, gallbladder wall thickening, or biliary dilatation. Pancreas: Unremarkable. No pancreatic ductal dilatation or surrounding inflammatory changes. Spleen: Normal in size without focal abnormality. Adrenals/Urinary Tract: Adrenal glands are unremarkable. Kidneys are normal, without renal  calculi, focal lesion, or hydronephrosis. Stomach/Bowel: Moderate-sized hiatal hernia. Feeding tube extends to the level of the mid to distal stomach. Below the diaphragm, the rest of the stomach is shielded almost entirely by the left lobe of the liver and the splenic flexure of the colon also abuts the left lobe liver tip. This anatomy is highly unfavorable to gastrostomy tube placement percutaneously. The rest of the visualized abdominal bowel loops demonstrate no evidence of obstruction or ileus. No free air is identified. No inflammatory process is seen. Vascular/Lymphatic: No significant vascular findings are present. No enlarged abdominal lymph nodes. Other: No ascites, anasarca or focal fluid collections. Musculoskeletal: No acute or significant osseous findings. IMPRESSION: 1.  Unfavorable anatomy for percutaneous gastrostomy tube placement. There is a moderate-sized hiatal hernia and the rest of the stomach is shielded by the left lobe of the liver. There also is encroachment of the splenic flexure of the colon abutting the tip of the left lobe of the liver. If the patient is in need for long-term enteral nutrition, consider surgical consultation for either surgical gastrostomy or jejunostomy. 2. Mild ground-glass opacity in both lower lobes at the visualized lung bases. Although this may be on the basis of atelectasis, subtle infection including atypical viral infection is not excluded. Electronically Signed   By: Aletta Edouard M.D.   On: 08/31/2019 13:11   DG Chest 1 View  Result Date: 08/23/2019 CLINICAL DATA:  Lethargy. EXAM: CHEST  1 VIEW COMPARISON:  August 14, 2019 FINDINGS: A nasogastric tube is seen with its distal end extending below the level of the diaphragm. Multiple sternal wires and vascular clips are seen. There is no evidence of acute infiltrate, pleural effusion or pneumothorax. The heart size and mediastinal contours are within normal limits. The visualized skeletal structures are  unremarkable. IMPRESSION: 1. Evidence of prior median sternotomy/CABG. 2. No acute or active cardiopulmonary disease. Electronically Signed   By: Virgina Norfolk M.D.   On: 08/23/2019 19:15   DG Chest 2 View  Result Date: 09/08/2019 CLINICAL DATA:  Shortness of breath. EXAM: CHEST - 2 VIEW COMPARISON:  Chest x-ray 08/23/2019 FINDINGS: The cardiac silhouette, mediastinal and hilar contours are within normal limits and stable. 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 Right lower lobe airspace opacity consistent with pneumonia. No pleural effusions or pulmonary edema. IMPRESSION: Right lower lobe pneumonia. Electronically Signed   By: Marijo Sanes M.D.   On: 09/08/2019 05:10   CT HEAD WO CONTRAST  Result Date: 08/26/2019 CLINICAL DATA:  Cerebral hemorrhage suspected. Additional history provided: Left-sided weakness with dysarthria and dysphagia secondary to acute right MCA infarction. EXAM: CT HEAD WITHOUT CONTRAST TECHNIQUE: Contiguous axial images were obtained from the base of the skull through the vertex without intravenous contrast. COMPARISON:  Head CT 08/14/2019 FINDINGS: Brain: Again demonstrated is a large right MCA vascular territory subacute infarct. Portions of the infarcted territory demonstrate fogging on the current examination. There has been a significant interval decrease in associated swelling and mass effect. No evidence of hemorrhagic conversion. No new demarcated infarct is identified. No midline shift, hydrocephalus or extra-axial fluid collection. Background chronic small vessel ischemic disease, unchanged. Mild generalized parenchymal atrophy. Vascular: No definite hyperdense vessel. However, a short segment occlusion was demonstrated at the right M1/M2 junction on prior CTA head/neck 08/09/2019. Skull: Normal. Negative for fracture or focal lesion. Sinuses/Orbits: Visualized orbits demonstrate no acute abnormality. No significant paranasal sinus disease or mastoid effusion  at the imaged levels. Other: Redemonstrated right suboccipital lipoma. 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 No evidence of hemorrhagic conversion. Significant interval decrease in associated mass effect as compared to 08/14/2019. No new demarcated infarct is identified. Stable background generalized parenchymal atrophy and chronic small vessel ischemic disease. Electronically Signed   By: Kellie Simmering DO   On: 08/26/2019 13:35   CT HEAD WO CONTRAST  Result Date: 08/14/2019 CLINICAL DATA:  Stroke follow-up. Worsening stroke symptoms. EXAM: CT HEAD WITHOUT CONTRAST TECHNIQUE: Contiguous axial images were obtained from the base of the skull through the vertex without intravenous contrast. COMPARISON:  08/09/2019 FINDINGS: Brain: Slight expansion of right MCA territory infarct, now involving more of the insula and frontal operculum. There is white matter  hypoattenuation consistent with chronic ischemic microangiopathy. No hemorrhage. No herniation or other significant mass effect. No hydrocephalus. Vascular: Atherosclerotic calcification of the internal carotid arteries at the skull base. No abnormal hyperdensity of the major intracranial arteries or dural venous sinuses. Skull: The visualized skull base, calvarium and extracranial soft tissues are normal. Sinuses/Orbits: No fluid levels or advanced mucosal thickening of the visualized paranasal sinuses. No mastoid or middle ear effusion. The orbits are normal. Other: None. IMPRESSION: Progression of right MCA territory infarct, extending further into the insula and frontal operculum without hemorrhage or herniation. Electronically Signed   By: Ulyses Jarred M.D.   On: 08/14/2019 02:16   DG CHEST PORT 1 VIEW  Result Date: 08/14/2019 CLINICAL DATA:  Fever. Shortness of breath. EXAM: PORTABLE CHEST 1 VIEW COMPARISON:  08/09/2019 and 09/05/2018 FINDINGS: Feeding tube tip is in the region of the duodenal bulb. Heart size and pulmonary  vascularity are normal. Minimal residual atelectasis at the left base medially. The lungs are otherwise clear. CABG. No bone abnormality. IMPRESSION: 1. Minimal residual atelectasis at the left lung base. Lung aeration has improved bilaterally. 2. Feeding tube tip is in the duodenal bulb. Electronically Signed   By: Lorriane Shire M.D.   On: 08/14/2019 10:06   DG Abd Portable 1V  Result Date: 08/15/2019 CLINICAL DATA:  Enteric tube placement EXAM: PORTABLE ABDOMEN - 1 VIEW COMPARISON:  08/09/2019 FINDINGS: Feeding tube enters the stomach and turned to the right. Tip appears to be in the region of the duodenal bulb. Normal bowel gas pattern. IMPRESSION: Feeding tube tip in the region of the duodenal bulb. Electronically Signed   By: Franchot Gallo M.D.   On: 08/15/2019 15:33   EEG adult  Result Date: 08/14/2019 Lora Havens, MD     08/14/2019 11:45 AM Patient Name: CALIPH QUINTO MRN: [id number] Epilepsy Attending: Lora Havens Referring Physician/Provider: Dr Roland Rack Date: 08/13/2018 Duration: 25.56 mins Patient history: 74 M with history of seziure who presented with stroke with and frequent left-sided twitching concerning for seizure. EEG to evaluate for seizure. Level of alertness: asleep/sedated AEDs during EEG study: LEV Technical aspects: This EEG study was done with scalp electrodes positioned according to the 10-20 International system of electrode placement. Electrical activity was acquired at a sampling rate of 500Hz  and reviewed with a high frequency filter of 70Hz  and a low frequency filter of 1Hz . EEG data were recorded continuously and digitally stored. DESCRIPTION: EEG showed continuous generalized and lateralized right hemisphere low amplitude 3-5hz  theta-delta slowing. Hyperventilation and photic stimulation were not performed. ABNORMALITY - Continuous slow, generalized and lateralized right hemisphere IMPRESSION: This study is suggestive of cortical dysfunction in right  hemisphere likely secondary to underlying infarct as well as severe diffuse encephalopathy, non specific to etiology. No seizures or epileptiform discharges were seen throughout the recording. Priyanka Barbra Sarks   Overnight EEG with video  Result Date: 08/15/2019 Lora Havens, MD     08/15/2019  1:51 PM Patient Name: BOBAK PYNES MRN: [id number] Epilepsy Attending: Lora Havens Referring Physician/Provider: Dr Karena Addison rule Duration: 08/13/2018 1128 to 08/15/2019 1250  Patient history: 17 M with history of seziure who presented with stroke with and frequent left-sided twitching concerning for seizure. EEG to evaluate for seizure.  Level of alertness:  Awake, lethragic  AEDs during EEG study: LEV  Technical aspects: This EEG study was done with scalp electrodes positioned according to the 10-20 International system of electrode placement. Electrical activity was acquired at a  sampling rate of 500Hz  and reviewed with a high frequency filter of 70Hz  and a low frequency filter of 1Hz . EEG data were recorded continuously and digitally stored.  DESCRIPTION:  During awake state, no clear posterior dominant rhythm is seen. EEG showed continuous generalized and lateralized right hemisphere low amplitude 3-5hz  theta-delta slowing. Hyperventilation and photic stimulation were not performed.  ABNORMALITY - Continuous slow, generalized and lateralized right hemisphere  IMPRESSION: This study is suggestive of cortical dysfunction in right hemisphere likely secondary to underlying infarct as well as severe diffuse encephalopathy, non specific to etiology. No seizures or epileptiform discharges were seen throughout the recording.  Lora Havens    Lab Data:  CBC: Recent Labs  Lab 09/08/19 0545 09/09/19 1658 09/10/19 0528  WBC 15.7* 7.5 7.1  HGB 14.8 10.5* 10.7*  HCT 46.1 32.5* 32.8*  MCV 102.4* 103.2* 100.6*  PLT 225 156 123456*   Basic Metabolic Panel: Recent Labs  Lab 09/07/19 1700  09/08/19 0545 09/09/19 0949 09/09/19 1658 09/10/19 0528  NA  --  150* 143  --  139  K  --  5.1 5.1  --  4.5  CL  --  112* 105  --  101  CO2  --  26 25  --  27  GLUCOSE  --  256* 386*  --  219*  BUN  --  66* 85*  --  80*  CREATININE  --  2.27* 3.31* 3.05* 2.61*  CALCIUM  --  10.4* 9.4  --  9.2  MG 2.6* 2.3  --   --  2.3  PHOS 5.2* 4.1  --   --  4.8*   GFR: Estimated Creatinine Clearance: 24.3 mL/min (A) (by C-G formula based on SCr of 2.61 mg/dL (H)). Liver Function Tests: Recent Labs  Lab 09/10/19 0528  AST 51*  ALT 22  ALKPHOS 64  BILITOT 0.9  PROT 5.4*  ALBUMIN 2.3*   No results for input(s): LIPASE, AMYLASE in the last 168 hours. No results for input(s): AMMONIA in the last 168 hours. Coagulation Profile: No results for input(s): INR, PROTIME in the last 168 hours. Cardiac Enzymes: No results for input(s): CKTOTAL, CKMB, CKMBINDEX, TROPONINI in the last 168 hours. BNP (last 3 results) No results for input(s): PROBNP in the last 8760 hours. HbA1C: No results for input(s): HGBA1C in the last 72 hours. CBG: Recent Labs  Lab 09/10/19 0742 09/10/19 1142 09/10/19 1642 09/10/19 2205 09/11/19 0847  GLUCAP 204* 195* 207* 229* 200*   Lipid Profile: No results for input(s): CHOL, HDL, LDLCALC, TRIG, CHOLHDL, LDLDIRECT in the last 72 hours. Thyroid Function Tests: No results for input(s): TSH, T4TOTAL, FREET4, T3FREE, THYROIDAB in the last 72 hours. Anemia Panel: No results for input(s): VITAMINB12, FOLATE, FERRITIN, TIBC, IRON, RETICCTPCT in the last 72 hours. Urine analysis:    Component Value Date/Time   COLORURINE YELLOW 08/23/2019 1435   APPEARANCEUR CLEAR 08/23/2019 1435   LABSPEC 1.017 08/23/2019 1435   PHURINE 6.0 08/23/2019 1435   GLUCOSEU NEGATIVE 08/23/2019 1435   GLUCOSEU NEGATIVE 12/08/2018 1017   HGBUR NEGATIVE 08/23/2019 1435   BILIRUBINUR NEGATIVE 08/23/2019 1435   KETONESUR NEGATIVE 08/23/2019 1435   PROTEINUR 30 (A) 08/23/2019 1435    UROBILINOGEN 0.2 12/08/2018 1017   NITRITE NEGATIVE 08/23/2019 1435   LEUKOCYTESUR NEGATIVE 08/23/2019 1435     Benito Mccreedy M.D. [facility] 09/11/2019, 12:55 PM  Pager: [phone] Between 7am to 7pm - call Pager - [phone]  After 7pm go to [email or web address] - password [facility]  Call night coverage person covering after 7pm

## 2019-09-12 DIAGNOSIS — J9601 Acute respiratory failure with hypoxia: Secondary | ICD-10-CM

## 2019-09-12 DIAGNOSIS — R627 Adult failure to thrive: Secondary | ICD-10-CM

## 2019-09-12 LAB — BASIC METABOLIC PANEL
Anion gap: 14 (ref 5–15)
BUN: 38 mg/dL — ABNORMAL HIGH (ref 8–23)
CO2: 21 mmol/L — ABNORMAL LOW (ref 22–32)
Calcium: 9.3 mg/dL (ref 8.9–10.3)
Chloride: 107 mmol/L (ref 98–111)
Creatinine, Ser: 1.21 mg/dL (ref 0.61–1.24)
GFR calc Af Amer: >60 mL/min (ref >60)
GFR calc non Af Amer: 56 mL/min — ABNORMAL LOW (ref >60)
Glucose, Bld: 192 mg/dL — ABNORMAL HIGH (ref 70–99)
Potassium: 5.8 mmol/L — ABNORMAL HIGH (ref 3.5–5.1)
Sodium: 142 mmol/L (ref 135–145)

## 2019-09-12 LAB — GLUCOSE, CAPILLARY
Glucose-Capillary: 116 mg/dL — ABNORMAL HIGH (ref 70–99)
Glucose-Capillary: 84 mg/dL (ref 70–99)
Glucose-Capillary: 54 mg/dL — ABNORMAL LOW (ref 70–99)
Glucose-Capillary: 125 mg/dL — ABNORMAL HIGH (ref 70–99)
Glucose-Capillary: 80 mg/dL (ref 70–99)

## 2019-09-12 MED ORDER — DEXTROSE 50 % IV SOLN
25.0000 g | INTRAVENOUS | Status: AC
  Administered 2019-09-12: 25 g via INTRAVENOUS

## 2019-09-12 MED ORDER — SODIUM ZIRCONIUM CYCLOSILICATE 10 G PO PACK
10.0000 g | PACK | Freq: Once | ORAL | Status: AC
  Administered 2019-09-12: 10 g via ORAL
  Filled 2019-09-12: qty 1

## 2019-09-12 MED ORDER — SODIUM CHLORIDE 0.9 % IV SOLN
1.5000 g | Freq: Four times a day (QID) | INTRAVENOUS | Status: DC
  Administered 2019-09-12 – 2019-09-13 (×3): 1.5 g via INTRAVENOUS
  Filled 2019-09-12 (×6): qty 4

## 2019-09-12 MED ORDER — DEXTROSE 50 % IV SOLN
INTRAVENOUS | Status: AC
  Filled 2019-09-12: qty 50

## 2019-09-12 MED ORDER — LORAZEPAM 2 MG/ML IJ SOLN
1.0000 mg | INTRAMUSCULAR | Status: DC | PRN
  Administered 2019-09-12 – 2019-09-16 (×7): 1 mg via INTRAVENOUS
  Filled 2019-09-12 (×7): qty 1

## 2019-09-12 NOTE — Progress Notes (Signed)
Hypoglycemic Event  CBG: 54  Treatment: 25 g. D50 given IV. Symptoms: Pt NPO. Follow-up CBG: Time: 1808  CBG Result:  125  Possible Reasons for Event: NPO Comments/MD notified:  Dr. Sharen Counter, Evalyn Casco

## 2019-09-12 NOTE — Plan of Care (Signed)
  Problem: Clinical Measurements: Goal: Will remain free from infection Outcome: Progressing   Problem: Elimination: Goal: Will not experience complications related to urinary retention Outcome: Progressing   

## 2019-09-12 NOTE — Progress Notes (Signed)
Pt seems agitated.  Morphine 1 mg given for pain, Daughter at bedside and would like to talk to a doctor.  Abd is slightly firm.

## 2019-09-12 NOTE — Progress Notes (Signed)
Pharmacy Antibiotic Note  Andres [name] is a 79 y.o. male admitted on 09/09/2019 with pneumonia.  Pharmacy has been consulted for Unasyn dosing.  Scr has improved 3.05>>1.21 with CrCl ~52  Plan: Increase Unasyn 1.5 gm IV q6hr Monitor renal function and C&S Monitor planned LOT.  Weight: 200 lb 2.8 oz (90.8 kg)  Temp (24hrs), Avg:98.1 F (36.7 C), Min:97.9 F (36.6 C), Max:98.3 F (36.8 C)  Recent Labs  Lab 09/08/19 0545 09/09/19 0949 09/09/19 1658 09/10/19 0528 09/12/19 0450  WBC 15.7*  --  7.5 7.1  --   CREATININE 2.27* 3.31* 3.05* 2.61* 1.21    Estimated Creatinine Clearance: 52.3 mL/min (by C-G formula based on SCr of 1.21 mg/dL).    No Known Allergies  Antimicrobials this admission: Unasyn 2/5 >>    Gerilyn Stargell A. Levada Dy, PharmD, BCPS, FNKF Clinical Pharmacist [place] Please utilize Amion for appropriate phone number to reach the unit pharmacist ([place])   09/12/2019, 9:18 AM

## 2019-09-12 NOTE — Progress Notes (Signed)
Nutrition Brief Note  Chart reviewed. Pt now transitioning to comfort care. Per MD notes, TF has been d/c per request of family. No further nutrition interventions warranted at this time.  Please re-consult as needed.   Loistine Chance, RD, LDN, [place] Registered Dietitian II Certified Diabetes Care and Education Specialist Please refer to [facility] for RD and/or RD on-call/weekend/after hours pager

## 2019-09-12 NOTE — Progress Notes (Addendum)
Patient ID: Andres [name], male   DOB: 03/28/1941, 79 y.o.   MRN: [id number]  This NP visited patient at the bedside as a follow up to  yesterday's [place] and to meet with daughter as scheduled today.   Continue conversation regarding current medical situation.  Values and goals of care important to the family were attempted to be elicited        Created space and opportunity for daughter to explore thoughts and feelings regarding current medical situation.  We discussed the difference between an aggressive medical intervention path and a palliative comfort path for this patient at this time in this situation.  Education offered on the natural trajectory and expectations at EOL,   Reviewed the MOST form in detail. Lots of confusions in spite of education.  Did not complete at this time until patient's daughter feels completely comfortable with her decisions.  Emotional support offered.  Questions and concerns addressed.  I verbalized my concern that anything could happen at any time and that likely,  prognosis is days to weeks.   Plan of Care -DNR/DNI -Continue IV fluids -Requesting prostat to be continued via PEG, no other feeding formula/tube feed requested -Low dose medications for symptom management -Continue to monitor and make transition of care decisions dependent on outcomes. -Request hospice liaison to offer information on hospice benefit and eligibility for residential hospice   Discussed with patient the importance of continued conversation with family and their  medical providers regarding overall plan of care and treatment options,  ensuring decisions are within the context of the patients values and GOCs.  Questions and concerns addressed   Discussed with Dr Tawanna Solo  Total time spent on the unit was 60 minutes    Palliative  medicine team will continue to support holistically  Greater than 50% of the time was spent in counseling and coordination of care  Wadie Lessen  NP  Palliative Medicine Team Team Phone # [phone] Pager [phone]

## 2019-09-12 NOTE — Progress Notes (Signed)
Pt's daughter, [name] at bedside, updated on events from this afternoon.

## 2019-09-12 NOTE — Social Work (Signed)
TOC team remains available for disposition pending further conversations between daughter and PMT.   Westley Hummer, MSW, [place] [employer]

## 2019-09-12 NOTE — Progress Notes (Signed)
Pt is NPO.  Clarification of oral meds needed.

## 2019-09-12 NOTE — Progress Notes (Signed)
RT called to assess patient due to desat into 80s. Patient's breath sounds are Rhonchi and diminished in bases. RT checked pulse oximetry on both hands of patient and HR 70-76 and spo2 93%-94% on both hands. RT suctioned patient's mouth and obtain small amount of tan secretions. Patient has weak cough. RT spoke with RN and informed them to call RT if any further assistance is needed. RT will monitor as needed.

## 2019-09-12 NOTE — Progress Notes (Addendum)
Unable to obtain satisfactory O2 sat on pt. Repositioned, provided oral care and suctioning but pt continued to desat into 80's. Respiratory called to assess, did not reccommed breathing treatment as pt was not wheezing but instead sounds wet. MD M. Sharlet Salina paged to inform and inquire of any new orders.  Addendum: MD Sharlet Salina returned call at 2300; Stated that family has agreed to symptomatic management but it does not sound as if Lasix would be beneficial at this time. Continue to monitor patient and keep MD informed of changes. No new orders at this time.

## 2019-09-12 NOTE — Progress Notes (Signed)
PROGRESS NOTE    Andres [name]  [id number] DOB: Jun 25, 1941 DOA: 09/09/2019 PCP: [name], MD   Brief Narrative:  Patient is a 79 year old male with past medical history of hypertension, seizure disorder, chronic kidney disease stage III, hyperlipidemia,coronary artery disease was recently admitted here for right MCA stroke with left-sided weakness and dysphagia and was discharged to rehabilitation facility.  He came back with progressive worsening shortness of breath and found to have right lower lobe pneumonia.  Had leukocytosis on presentation.  He was hypoxic and confused.  Patient was admitted for the management of acute respiratory failure due to pneumonia with acute metabolic encephalopathy.  After discussion with family, palliative care has been consulted.  Daughter leaning towards comfort care/residential hospice.  Assessment & Plan:   Principal Problem:   Respiratory failure, acute ([place]) Active Problems:   Diabetes ([place])   Hyperlipidemia   Essential hypertension   Pneumonia, aspiration ([place])   Acute ischemic stroke ([place])   Dysphagia   Pneumonia   At high risk for aspiration   Palliative care by specialist   DNR (do not resuscitate)   Acute respiratory failure with hypoxia: Most likely secondary to aspiration pneumonia.  Currently he is n.p.o. . He is on Unasyn.  Currently on 5 L of oxygen per minute.  Acute metabolic encephalopathy: Multifactorial secondary to stroke, respiratory failure, renal dysfunction.  He remains unresponsive.  He was agitated so was given a dose of Ativan.  Status post CVA/dysphagia: Has PEG tube.  N.p.o.  And he has left-sided residual weakness.  Patient remains unresponsive.  Poor prognosis/ethics: Palliative care following.  Poor prognosis, we have been recommending hospice approach.  Daughter wants to meet palliative care again and is interested on initiating comfort care versus residential hospice.  Hyperkalemia: Given a dose of  Lokelma today.  Will stop checking for labs.  Seizure disorder: On Keppra.  CKD stage III: Currently kidney function at baseline.  Diabetes type 2: On sliding scale  insulin.  Nutrition Problem: Inadequate oral intake Etiology: inability to eat      DVT prophylaxis: Heparin [place] Code Status: DNR Family Communication: Discussed with daughter at the bedside Disposition Plan: Patient is from a facility.  We have recommended residential hospice versus initiating comfort care here.  Discussion with palliative care pending.   Consultants: None  Procedures:None  Antimicrobials:  Anti-infectives (From admission, onward)   Start     Dose/Rate Route Frequency Ordered Stop   09/12/19 1130  ampicillin-sulbactam (UNASYN) 1.5 g in sodium chloride 0.9 % 100 mL IVPB     1.5 g 200 mL/hr over 30 Minutes Intravenous Every 6 hours 09/12/19 0920     09/09/19 1730  ampicillin-sulbactam (UNASYN) 1.5 g in sodium chloride 0.9 % 100 mL IVPB  Status:  Discontinued     1.5 g 200 mL/hr over 30 Minutes Intravenous Every 12 hours 09/09/19 1713 09/12/19 0920      Subjective: Patient seen and examined at the bedside this afternoon.  Hemodynamically stable but remains unresponsive.  He was agitated earlier and was given a dose of Ativan.  Currently looks comfortable.  Objective: Vitals:   09/11/19 0533 09/11/19 1727 09/11/19 2118 09/12/19 0605  BP: 116/71 (!) 149/96 (!) 149/81 114/66  Pulse: 83 94 89 77  Resp: 15  (!) 22 (!) 22  Temp: 98 F (36.7 C)  97.9 F (36.6 C) 98.3 F (36.8 C)  TempSrc: Oral  Oral Oral  SpO2: 100% 98% 96% 100%  Weight:  Intake/Output Summary (Last 24 hours) at 09/12/2019 1249 Last data filed at 09/12/2019 1100 Gross per 24 hour  Intake --  Output 950 ml  Net -950 ml   Filed Weights   09/10/19 0500  Weight: 90.8 kg    Examination:  General exam: Not in distress, sleeping HEENT:Oral mucosa moist, Ear/Nose normal on gross exam Respiratory system: no wheezes or  crackles  Cardiovascular system: S1 & S2 heard, RRR. No JVD, murmurs, rubs, gallops or clicks. No pedal edema. Gastrointestinal system: Abdomen is nondistended, soft and nontender. PEG Central nervous system: Not Alert and oriented. Extremities: No edema, no clubbing ,no cyanosis Skin: No rashes, lesions or ulcers,no icterus ,no pallor   Data Reviewed: I have personally reviewed following labs and imaging studies  CBC: Recent Labs  Lab 09/08/19 0545 09/09/19 1658 09/10/19 0528  WBC 15.7* 7.5 7.1  HGB 14.8 10.5* 10.7*  HCT 46.1 32.5* 32.8*  MCV 102.4* 103.2* 100.6*  PLT 225 156 123456*   Basic Metabolic Panel: Recent Labs  Lab 09/07/19 1700 09/08/19 0545 09/09/19 0949 09/09/19 1658 09/10/19 0528 09/12/19 0450  NA  --  150* 143  --  139 142  K  --  5.1 5.1  --  4.5 5.8*  CL  --  112* 105  --  101 107  CO2  --  26 25  --  27 21*  GLUCOSE  --  256* 386*  --  219* 192*  BUN  --  66* 85*  --  80* 38*  CREATININE  --  2.27* 3.31* 3.05* 2.61* 1.21  CALCIUM  --  10.4* 9.4  --  9.2 9.3  MG 2.6* 2.3  --   --  2.3  --   PHOS 5.2* 4.1  --   --  4.8*  --    GFR: Estimated Creatinine Clearance: 52.3 mL/min (by C-G formula based on SCr of 1.21 mg/dL). Liver Function Tests: Recent Labs  Lab 09/10/19 0528  AST 51*  ALT 22  ALKPHOS 64  BILITOT 0.9  PROT 5.4*  ALBUMIN 2.3*   No results for input(s): LIPASE, AMYLASE in the last 168 hours. No results for input(s): AMMONIA in the last 168 hours. Coagulation Profile: No results for input(s): INR, PROTIME in the last 168 hours. Cardiac Enzymes: No results for input(s): CKTOTAL, CKMB, CKMBINDEX, TROPONINI in the last 168 hours. BNP (last 3 results) No results for input(s): PROBNP in the last 8760 hours. HbA1C: No results for input(s): HGBA1C in the last 72 hours. CBG: Recent Labs  Lab 09/11/19 1344 09/11/19 1721 09/11/19 2224 09/12/19 0810 09/12/19 1231  GLUCAP 154* 122* 130* 116* 84   Lipid Profile: No results for  input(s): CHOL, HDL, LDLCALC, TRIG, CHOLHDL, LDLDIRECT in the last 72 hours. Thyroid Function Tests: No results for input(s): TSH, T4TOTAL, FREET4, T3FREE, THYROIDAB in the last 72 hours. Anemia Panel: No results for input(s): VITAMINB12, FOLATE, FERRITIN, TIBC, IRON, RETICCTPCT in the last 72 hours. Sepsis Labs: No results for input(s): PROCALCITON, LATICACIDVEN in the last 168 hours.  Recent Results (from the past 240 hour(s))  MRSA PCR Screening     Status: None   Collection Time: 09/06/19  1:14 AM   Specimen: Nasal Mucosa; Nasopharyngeal  Result Value Ref Range Status   MRSA by PCR NEGATIVE NEGATIVE Final    Comment:        The GeneXpert MRSA Assay (FDA approved for NASAL specimens only), is one component of a comprehensive MRSA colonization surveillance program. It is not intended  to diagnose MRSA infection nor to guide or monitor treatment for MRSA infections. Performed at [facility], [place] [address], [place], [place] [address]          Radiology Studies: No results found.      Scheduled Meds: . amLODipine  5 mg Oral Daily  . aspirin  81 mg Per Tube Daily  . atorvastatin  40 mg Per Tube q1800  . chlorhexidine  15 mL Mouth Rinse BID  . docusate sodium  100 mg Oral BID  . feeding supplement (PRO-STAT SUGAR FREE 64)  30 mL Per Tube BID  . heparin  5,000 Units Subcutaneous Q8H  . insulin aspart  0-15 Units Subcutaneous TID AC & HS  . levETIRAcetam  500 mg Per Tube BID  . mouth rinse  15 mL Mouth Rinse q12n4p  . metoprolol tartrate  50 mg Per Tube BID  . sodium zirconium cyclosilicate  10 g Oral Once   Continuous Infusions: . ampicillin-sulbactam (UNASYN) IV    . dextrose 75 mL/hr at 09/10/19 0533  . feeding supplement (OSMOLITE 1.5 CAL)       LOS: 3 days    Time spent:35 mins. More than 50% of that time was spent in counseling and/or coordination of care.      Shelly Coss, MD [facility] P2/03/2020, 12:49 PM

## 2019-09-13 LAB — GLUCOSE, CAPILLARY
Glucose-Capillary: 58 mg/dL — ABNORMAL LOW (ref 70–99)
Glucose-Capillary: 145 mg/dL — ABNORMAL HIGH (ref 70–99)
Glucose-Capillary: 131 mg/dL — ABNORMAL HIGH (ref 70–99)
Glucose-Capillary: 114 mg/dL — ABNORMAL HIGH (ref 70–99)
Glucose-Capillary: 112 mg/dL — ABNORMAL HIGH (ref 70–99)
Glucose-Capillary: 92 mg/dL (ref 70–99)
Glucose-Capillary: 90 mg/dL (ref 70–99)

## 2019-09-13 MED ORDER — DOCUSATE SODIUM 50 MG/5ML PO LIQD
100.0000 mg | Freq: Two times a day (BID) | ORAL | Status: DC
  Administered 2019-09-13 – 2019-09-18 (×11): 100 mg via ORAL
  Filled 2019-09-13 (×11): qty 10

## 2019-09-13 MED ORDER — DEXTROSE 50 % IV SOLN
INTRAVENOUS | Status: AC
  Administered 2019-09-13: 50 mL via INTRAVENOUS
  Filled 2019-09-13: qty 50

## 2019-09-13 MED ORDER — DEXTROSE 50 % IV SOLN: 50.0000 mL | Freq: Once | INTRAVENOUS | Status: DC

## 2019-09-13 MED ORDER — SODIUM CHLORIDE 0.9 % IV SOLN
3.0000 g | Freq: Three times a day (TID) | INTRAVENOUS | Status: DC
  Administered 2019-09-13 – 2019-09-19 (×18): 3 g via INTRAVENOUS
  Filled 2019-09-13: qty 8
  Filled 2019-09-13 (×2): qty 3
  Filled 2019-09-13: qty 8
  Filled 2019-09-13 (×11): qty 3
  Filled 2019-09-13: qty 8
  Filled 2019-09-13 (×5): qty 3

## 2019-09-13 NOTE — Progress Notes (Signed)
Engineer, maintenance [facility]) Hospital Liaison note.   Notified by Wadie Lessen, NP that family is interested in information on hospice and palliative services. Spoke with daughter, [name] to explain services and answer questions. [place] brochures emailed to [place] as well.   Please call with any hospice or palliative related questions.   Thank you,  Farrel Gordon, RN, CCM      [place] (listed on AM[facility] under Hospice and [place] [facility] [place])    [phone]

## 2019-09-13 NOTE — Progress Notes (Signed)
Pharmacy Antibiotic Note  Andres [name] is a 79 y.o. male admitted on 09/09/2019 with pneumonia.  Pharmacy has been consulted for Unasyn dosing.   ID: Asp PNA. WBC WNL. Scr 1.21 way down. Unasyn 2/5 >>   2/2 mrsa pcr neg  Plan: Change Unasyn to 3g IV q8hrs.  Can discharge on Augmentin Pharmacy will sign off. Please reconsult for further dosing assitance.   Weight: 205 lb 4 oz (93.1 kg)  Temp (24hrs), Avg:98.3 F (36.8 C), Min:98 F (36.7 C), Max:98.7 F (37.1 C)  Recent Labs  Lab 09/08/19 0545 09/09/19 0949 09/09/19 1658 09/10/19 0528 09/12/19 0450  WBC 15.7*  --  7.5 7.1  --   CREATININE 2.27* 3.31* 3.05* 2.61* 1.21    Estimated Creatinine Clearance: 53 mL/min (by C-G formula based on SCr of 1.21 mg/dL).    No Known Allergies   Andres [name], PharmD, BCPS Clinical Staff Pharmacist [email or web address]  Wayland Salinas 09/13/2019 10:17 AM

## 2019-09-13 NOTE — Progress Notes (Signed)
Paged MD Sharlet Salina to inform that pt's blood glucose dropped to 58. Administered one amp of D 50 per protocol. Will recheck in 15 minutes. Pt's LOC/status remained unchanged.

## 2019-09-13 NOTE — Progress Notes (Signed)
PROGRESS NOTE    Andres [name]  [id number] DOB: 08/11/1940 DOA: 09/09/2019 PCP: [name], MD   Brief Narrative:  Patient is a 78 year old male with past medical history of hypertension, seizure disorder, chronic kidney disease stage III, hyperlipidemia,coronary artery disease was recently admitted here for right MCA stroke with left-sided weakness and dysphagia and was discharged to rehabilitation facility.  He came back with progressive worsening shortness of breath and found to have right lower lobe pneumonia.  Had leukocytosis on presentation.  He was hypoxic and confused.  Patient was admitted for the management of acute respiratory failure due to pneumonia with acute metabolic encephalopathy.  After discussion with family, palliative care has been consulted.  Daughter leaning towards comfort care/residential hospice but she wants to discuss with palliative care again.  Assessment & Plan:   Principal Problem:   Respiratory failure, acute ([place]) Active Problems:   Diabetes ([place])   Hyperlipidemia   Essential hypertension   Pneumonia, aspiration ([place])   Acute ischemic stroke ([place])   Dysphagia   Pneumonia   At high risk for aspiration   Palliative care by specialist   DNR (do not resuscitate)   Adult failure to thrive   Acute respiratory failure with hypoxia: Most likely secondary to aspiration pneumonia.  Currently he is n.p.o. . He is on Unasyn.  Currently on 4-5 L of oxygen per minute.  Acute metabolic encephalopathy: Multifactorial secondary to stroke, respiratory failure, renal dysfunction.  He remains mostly  Unresponsive.    Status post CVA/dysphagia: Has PEG tube.  N.p.o.  And he has left-sided residual weakness.  Patient remains unresponsive.  Poor prognosis/ethics: Palliative care following.  Poor prognosis, we have been recommending hospice approach.  Daughter wants to meet palliative care again and is interested on initiating comfort care .  Also discussed about  residential hospice.  Hyperkalemia: Blood work not done this morning as per the discussion with daughter yesterday,  Seizure disorder: On Keppra.  CKD stage III: Currently kidney function at baseline.  Hypoglycemia: On D5.  Diabetes type 2: On sliding scale  insulin.  Nutrition Problem: Inadequate oral intake Etiology: inability to eat      DVT prophylaxis: Heparin [place] Code Status: DNR Family Communication: Discussed with daughter on phone Disposition Plan: Patient is from a facility.  We have recommended residential hospice versus initiating comfort care here.  Discussion with palliative care going on.   Consultants: None  Procedures:None  Antimicrobials:  Anti-infectives (From admission, onward)   Start     Dose/Rate Route Frequency Ordered Stop   09/13/19 1300  Ampicillin-Sulbactam (UNASYN) 3 g in sodium chloride 0.9 % 100 mL IVPB     3 g 200 mL/hr over 30 Minutes Intravenous Every 8 hours 09/13/19 1018     09/12/19 1130  ampicillin-sulbactam (UNASYN) 1.5 g in sodium chloride 0.9 % 100 mL IVPB  Status:  Discontinued     1.5 g 200 mL/hr over 30 Minutes Intravenous Every 6 hours 09/12/19 0920 09/13/19 1018   09/09/19 1730  ampicillin-sulbactam (UNASYN) 1.5 g in sodium chloride 0.9 % 100 mL IVPB  Status:  Discontinued     1.5 g 200 mL/hr over 30 Minutes Intravenous Every 12 hours 09/09/19 1713 09/12/19 0920      Subjective: Patient seen and examined the bedside this morning.  Hemodynamically stable.  Mental status might have slightly improved today.  He opened his eyes on calling his name.  Not agitated or in any kind of distress.  Objective: Vitals:  09/12/19 2025 09/12/19 2213 09/13/19 0426 09/13/19 0500  BP: 113/67  118/60   Pulse: 75 71 95   Resp: 20 20 16    Temp: 98 F (36.7 C)  98.7 F (37.1 C)   TempSrc: Oral  Oral   SpO2: (!) 80% 93% 100%   Weight:    93.1 kg    Intake/Output Summary (Last 24 hours) at 09/13/2019 1314 Last data filed at 09/13/2019  1058 Gross per 24 hour  Intake 5317.33 ml  Output 1300 ml  Net 4017.33 ml   Filed Weights   09/10/19 0500 09/13/19 0500  Weight: 90.8 kg 93.1 kg    Examination:   General exam: Not in distress or agitated. Respiratory system: Bilateral equal air entry, normal vesicular breath sounds, no wheezes or crackles  Cardiovascular system: S1 & S2 heard, RRR. No JVD, murmurs, rubs, gallops or clicks. Gastrointestinal system: Abdomen is nondistended, soft and nontender.PEG Central nervous system: Not alert or oriented Extremities: No edema, no clubbing ,no cyanosis Skin: No rashes, lesions or ulcers,no icterus ,no pallor    Data Reviewed: I have personally reviewed following labs and imaging studies  CBC: Recent Labs  Lab 09/08/19 0545 09/09/19 1658 09/10/19 0528  WBC 15.7* 7.5 7.1  HGB 14.8 10.5* 10.7*  HCT 46.1 32.5* 32.8*  MCV 102.4* 103.2* 100.6*  PLT 225 156 123456*   Basic Metabolic Panel: Recent Labs  Lab 09/07/19 1700 09/08/19 0545 09/09/19 0949 09/09/19 1658 09/10/19 0528 09/12/19 0450  NA  --  150* 143  --  139 142  K  --  5.1 5.1  --  4.5 5.8*  CL  --  112* 105  --  101 107  CO2  --  26 25  --  27 21*  GLUCOSE  --  256* 386*  --  219* 192*  BUN  --  66* 85*  --  80* 38*  CREATININE  --  2.27* 3.31* 3.05* 2.61* 1.21  CALCIUM  --  10.4* 9.4  --  9.2 9.3  MG 2.6* 2.3  --   --  2.3  --   PHOS 5.2* 4.1  --   --  4.8*  --    GFR: Estimated Creatinine Clearance: 53 mL/min (by C-G formula based on SCr of 1.21 mg/dL). Liver Function Tests: Recent Labs  Lab 09/10/19 0528  AST 51*  ALT 22  ALKPHOS 64  BILITOT 0.9  PROT 5.4*  ALBUMIN 2.3*   No results for input(s): LIPASE, AMYLASE in the last 168 hours. No results for input(s): AMMONIA in the last 168 hours. Coagulation Profile: No results for input(s): INR, PROTIME in the last 168 hours. Cardiac Enzymes: No results for input(s): CKTOTAL, CKMB, CKMBINDEX, TROPONINI in the last 168 hours. BNP (last 3  results) No results for input(s): PROBNP in the last 8760 hours. HbA1C: No results for input(s): HGBA1C in the last 72 hours. CBG: Recent Labs  Lab 09/13/19 0103 09/13/19 0140 09/13/19 0422 09/13/19 0731 09/13/19 1155  GLUCAP 58* 145* 131* 114* 112*   Lipid Profile: No results for input(s): CHOL, HDL, LDLCALC, TRIG, CHOLHDL, LDLDIRECT in the last 72 hours. Thyroid Function Tests: No results for input(s): TSH, T4TOTAL, FREET4, T3FREE, THYROIDAB in the last 72 hours. Anemia Panel: No results for input(s): VITAMINB12, FOLATE, FERRITIN, TIBC, IRON, RETICCTPCT in the last 72 hours. Sepsis Labs: No results for input(s): PROCALCITON, LATICACIDVEN in the last 168 hours.  Recent Results (from the past 240 hour(s))  MRSA PCR Screening  Status: None   Collection Time: 09/06/19  1:14 AM   Specimen: Nasal Mucosa; Nasopharyngeal  Result Value Ref Range Status   MRSA by PCR NEGATIVE NEGATIVE Final    Comment:        The GeneXpert MRSA Assay (FDA approved for NASAL specimens only), is one component of a comprehensive MRSA colonization surveillance program. It is not intended to diagnose MRSA infection nor to guide or monitor treatment for MRSA infections. Performed at [facility], [place] [address] [place], [place] [address]          Radiology Studies: No results found.      Scheduled Meds: . amLODipine  5 mg Oral Daily  . aspirin  81 mg Per Tube Daily  . atorvastatin  40 mg Per Tube q1800  . chlorhexidine  15 mL Mouth Rinse BID  . dextrose  50 mL Intravenous Once  . docusate  100 mg Oral BID  . feeding supplement (PRO-STAT SUGAR FREE 64)  30 mL Per Tube BID  . heparin  5,000 Units Subcutaneous Q8H  . insulin aspart  0-15 Units Subcutaneous TID AC & HS  . levETIRAcetam  500 mg Per Tube BID  . mouth rinse  15 mL Mouth Rinse q12n4p   Continuous Infusions: . ampicillin-sulbactam (UNASYN) IV 3 g (09/13/19 1308)  . dextrose 75 mL/hr at 09/12/19 1626  . feeding  supplement (OSMOLITE 1.5 CAL)       LOS: 4 days    Time spent:35 mins. More than 50% of that time was spent in counseling and/or coordination of care.      Shelly Coss, MD [facility] P2/04/2020, 1:14 PM

## 2019-09-14 DIAGNOSIS — R451 Restlessness and agitation: Secondary | ICD-10-CM

## 2019-09-14 LAB — GLUCOSE, CAPILLARY
Glucose-Capillary: 103 mg/dL — ABNORMAL HIGH (ref 70–99)
Glucose-Capillary: 126 mg/dL — ABNORMAL HIGH (ref 70–99)
Glucose-Capillary: 132 mg/dL — ABNORMAL HIGH (ref 70–99)
Glucose-Capillary: 149 mg/dL — ABNORMAL HIGH (ref 70–99)
Glucose-Capillary: 150 mg/dL — ABNORMAL HIGH (ref 70–99)
Glucose-Capillary: 163 mg/dL — ABNORMAL HIGH (ref 70–99)

## 2019-09-14 NOTE — TOC Initial Note (Signed)
Transition of Care [facility]) - Initial/Assessment Note    Patient Details  Name: Andres [name] MRN: [id number] Date of Birth: 1940-08-23  Transition of Care [facility]) CM/SW Contact:    Alexander Mt, LCSW Phone Number: 09/14/2019, 3:41 PM  Clinical Narrative:                 CSW and RNCM Nira Conn met with pt daughter [name] in conference room of 6N. Introduced self, role, reason for meeting. We discussed our roles on the [facility] team and inquired as to pt daughter goals for pt. Pt daughter is here from [place], pt has a son that lives in [place]. Pt was recently transferred to 6N from [place]. HCPOA paperwork is uploaded into pt chart. CSW inquired as to pt coverage since it is listed as Education officer, museum" in our system, pt daughter has pt card- he is active with [facility] Medicare, the number on the card pt has and our member # in the chart is the same as verified by [facility] and pt daughter.   Pt daughter would like for pt to go to SNF in [place] where she lives; she had begun working with Hilda Blades, [facility] regarding this plan. We discussed with pt daughter that there may be private pay costs associated with transport out of state. Pt daughter also had discussed this with CIR RNCM and had gotten an estimate from [facility] located in [place].   CSW discussed that I will review documentation from CIR and reach out to Debra to access any discharge planning that had been started while pt was in [place]. Pt daughter shares that she is in communication with the administrator at the SNF through her work. CSW explained that we will make contact with SNF and pt daughter gives permission for CSW to send updates as needed to SNF for review.   Pt daughter also asked our team about obtaining an itemized bill at this time- [facility] team shared that we are unable to answer questions regarding billing as we do not manage that aspect of pt hospital stay; suggested that our nursing leadership may be able to answer that  question or ask their colleagues.   Pt daughter also asked about pt belongings from ED that she has not been able to locate and whether or not we have a lost and found. Explained that lost and found is managed by our security team and that the nursing secretary should be able to inquire about any missing belongings to security.  CSW shared that it will be up to MD care team as to when/if pt is medically stable for transport. Pt daughter has this writers work phone number and shared that it is a Psychologist, occupational if I am off shift or not able to answer. Pt daughter also aware this writer will not be on shift this weekend but we will leave updated hand off for [facility] team coverage.   CSW has reached out to CIR to obtain Hilda Blades, RNCM's number. Will review chart also for any notes detailing discharge plans that had be initiated.   Expected Discharge Plan: [place] Barriers to Discharge: Continued Medical Work up, Ship broker   Patient Goals and CMS Choice Patient states their goals for this hospitalization and ongoing recovery are:: return to [place] and go to a skilled nursing facility [employer].gov Compare Post Acute Care list provided to:: Patient Represenative (must comment)(pt daughter [name]) Choice offered to / list presented to : Adult Children  Expected  Discharge Plan and Services Expected Discharge Plan: [place] In-house Referral: Clinical Social Work Discharge Planning Services: [place] Acute Care Choice: [place] arrangements for the past 2 months: [place]  Prior Living Arrangements/Services Living arrangements for the past 2 months: [place] with:: Self Patient language and need for interpreter reviewed:: Yes(no needs) Do you feel safe going back to the place where you live?: No   plan for SNF placement in [place] for further care  Need for Family Participation in Patient Care:  Yes (Comment)(assistance with daily cares; support with decision making) Care giver support system in place?: Yes (comment)(pt daughter [name])   Criminal Activity/Legal Involvement Pertinent to Current Situation/Hospitalization: No - Comment as needed  Permission Sought/Granted Permission sought to share information with : Family Supports, Chartered certified accountant granted to share information with : Yes, Release of Information Signed  Share Information with NAME: Jahmeir Geisen     Permission granted to share info w Relationship: daughter  Permission granted to share info w Contact Information: [phone]  Emotional Assessment Appearance:: Other (Comment Required(assessment completed with pt daughter [name]) Attitude/Demeanor/Rapport: Other (comment)(assessment completed with pt daughter [name]) Affect (typically observed): Other (comment)(assessment completed with pt daughter [name]) Orientation: : Fluctuating Orientation (Suspected and/or reported Sundowners)(as documented by RN flowsheets) Alcohol / Substance Use: Not Applicable Psych Involvement: No (comment)(not applicable)  Admission diagnosis:  Pneumonia [J18.9] Patient Active Problem List   Diagnosis Date Noted  . Adult failure to thrive   . At high risk for aspiration   . Palliative care by specialist   . DNR (do not resuscitate)   . Respiratory failure, acute ([place]) 09/09/2019  . Pneumonia 09/09/2019  . Acute blood loss anemia   . Labile blood pressure   . Diabetes mellitus type 2 in obese ([place])   . 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 Seizures ([place])   . Stage 3 chronic kidney disease   . Dysphagia   . Coagulase negative Staphylococcus bacteremia 08/15/2019  . Acute ischemic stroke ([place]) 08/09/2019  . Transaminitis 08/09/2019  . History of seizures 08/09/2019  . Left hip pain 03/02/2019  . Syncope 09/05/2018  . Gout 07/10/2018  . Scrotal swelling 06/09/2018  . Urinary dribbling  06/03/2017  . Mass of scrotum 06/03/2017  . Injury of left hand 07/21/2015  . Hx of   sessile serrated colonic polyp 02/27/2015  . Chronic anticoagulation 12/22/2014  . Colon cancer screening 12/22/2014  . Morbid obesity ([place]) 09/29/2014  . Mitral valve prolapse 11/24/2013  . IT band syndrome 09/23/2013  . Hypotension, unspecified 07/21/2013  . Dyspnea 07/21/2013  . Elevated troponin I level- (pk Troponin 0.34 in setting of acute renal insufficency) 07/21/2013  . 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 Last nuc 2012 negative. 07/21/2013  . SBO (small bowel obstruction) ([place]) 07/21/2013  . Inguinal hernia, Rt reduced, Lt present 07/21/2013  . Acute renal failure ([place]) 07/21/2013  . Pneumonia, aspiration ([place]) 07/21/2013  . Obesity (BMI 30-39.9) 01/17/2013  . Osteoarthritis, hand, primary localized 08/12/2012  . Preventative health care 04/11/2011  . MILD COGNITIVE IMPAIRMENT SO STATED 10/03/2010  . CEREBROVASCULAR ACCIDENT, HX OF 10/03/2010  . RASH-NONVESICULAR 05/12/2008  . TRANSIENT ISCHEMIC ATTACK 01/27/2008  . Diabetes ([place]) 12/08/2007  . ALLERGIC RHINITIS 12/08/2007  . DIVERTICULOSIS, COLON 12/08/2007  . LOW BACK PAIN 12/08/2007  . NEPHROLITHIASIS, HX OF 12/08/2007  . BPH (benign prostatic hyperplasia) 04/05/2007  . Hyperlipidemia 04/02/2007  .  CARPAL TUNNEL SYNDROME, BILATERAL 04/02/2007  . CATARACT NOS 04/02/2007  . Essential hypertension 04/02/2007   PCP:  [name], MD Pharmacy:   [facility] [place], [place] [address] [place] [address] Phone: [phone] Fax: [phone]   Readmission Risk Interventions No flowsheet data found.

## 2019-09-14 NOTE — Progress Notes (Signed)
PROGRESS NOTE    Andres [name]  [id number] DOB: 02/06/1941 DOA: 09/09/2019 PCP: [name], MD   Brief Narrative:  Patient is a 79 year old male with past medical history of hypertension, seizure disorder, chronic kidney disease stage III, hyperlipidemia,coronary artery disease was recently admitted here for right MCA stroke with left-sided weakness and dysphagia and was discharged to rehabilitation facility.  He came back with progressive worsening shortness of breath and found to have right lower lobe pneumonia.  Had leukocytosis on presentation.  He was hypoxic and confused.  Patient was admitted for the management of acute respiratory failure due to pneumonia with acute metabolic encephalopathy.  After discussion with family, palliative care has been consulted.  Discussion going on for initing full  comfort care vs transfer to residential hospice .  Palliative care closely following.   Assessment & Plan:   Principal Problem:   Respiratory failure, acute ([place]) Active Problems:   Diabetes ([place])   Hyperlipidemia   Essential hypertension   Pneumonia, aspiration ([place])   Acute ischemic stroke ([place])   Dysphagia   Pneumonia   At high risk for aspiration   Palliative care by specialist   DNR (do not resuscitate)   Adult failure to thrive   Acute respiratory failure with hypoxia: Most likely secondary to aspiration pneumonia.  Currently he is n.p.o. . He is on Unasyn.  Currently on 4-5 L of oxygen per minute.  Acute metabolic encephalopathy: Multifactorial secondary to stroke, respiratory failure, renal dysfunction.  He remains mostly  Unresponsive.    Status post CVA/dysphagia: Has PEG tube.  N.p.o.  And he has left-sided residual weakness.  Patient remains unresponsive.  Poor prognosis/ethics: Palliative care following.  Poor prognosis, we have been recommending hospice approach.  Discussion going on for initing full  comfort care vs transfer to residential hospice .  Palliative  care closely following.  Hyperkalemia: Blood work not done this morning as per the discussion with daughter few days ago.We are not escalating any treatment and  Pain.The goal is for comfort care.   Seizure disorder: On Keppra.  CKD stage III: Currently kidney function at baseline.  Hypoglycemia: On D5.Monitor CBGs  Diabetes type 2: On sliding scale  insulin.  Nutrition Problem: Inadequate oral intake Etiology: inability to eat      DVT prophylaxis: Heparin [place] Code Status: DNR Family Communication: Discussed with daughter on bedside this morning Disposition Plan: Patient is from a facility.  We have recommended residential hospice versus initiating comfort care here.  Discussion with palliative care/SW  going on.   Consultants: None  Procedures:None  Antimicrobials:  Anti-infectives (From admission, onward)   Start     Dose/Rate Route Frequency Ordered Stop   09/13/19 1300  Ampicillin-Sulbactam (UNASYN) 3 g in sodium chloride 0.9 % 100 mL IVPB     3 g 200 mL/hr over 30 Minutes Intravenous Every 8 hours 09/13/19 1018     09/12/19 1130  ampicillin-sulbactam (UNASYN) 1.5 g in sodium chloride 0.9 % 100 mL IVPB  Status:  Discontinued     1.5 g 200 mL/hr over 30 Minutes Intravenous Every 6 hours 09/12/19 0920 09/13/19 1018   09/09/19 1730  ampicillin-sulbactam (UNASYN) 1.5 g in sodium chloride 0.9 % 100 mL IVPB  Status:  Discontinued     1.5 g 200 mL/hr over 30 Minutes Intravenous Every 12 hours 09/09/19 1713 09/12/19 0920      Subjective: Patient seen and examined at the bedside this morning.  Hemodynamically stable but he remains unresponsive.  No new  changes from yesterday.  Daughter was at the bedside and about to discuss with palliative care  Objective: Vitals:   09/13/19 2150 09/14/19 0431 09/14/19 0500 09/14/19 0819  BP: 136/64 126/72  131/73  Pulse: 99 93  91  Resp: 16 16  20   Temp: 99.2 F (37.3 C) 98.5 F (36.9 C)  97.8 F (36.6 C)  TempSrc: Oral Oral   Oral  SpO2: 97% 97%  100%  Weight:   96.8 kg     Intake/Output Summary (Last 24 hours) at 09/14/2019 1351 Last data filed at 09/14/2019 0944 Gross per 24 hour  Intake 1838.11 ml  Output 1355 ml  Net 483.11 ml   Filed Weights   09/10/19 0500 09/13/19 0500 09/14/19 0500  Weight: 90.8 kg 93.1 kg 96.8 kg    Examination:   General exam: Unresponsive Respiratory system: Hyperventilating, no crackles or wheezes  cardiovascular system: S1 & S2 heard, RRR.  Gastrointestinal system: Abdomen is nondistended, soft and nontender. Central nervous system:Not  Alert or oriented. Extremities: No edema, no clubbing ,no cyanosis Skin: No rashes, lesions or ulcers,no icterus ,no pallor      Data Reviewed: I have personally reviewed following labs and imaging studies  CBC: Recent Labs  Lab 09/08/19 0545 09/09/19 1658 09/10/19 0528  WBC 15.7* 7.5 7.1  HGB 14.8 10.5* 10.7*  HCT 46.1 32.5* 32.8*  MCV 102.4* 103.2* 100.6*  PLT 225 156 123456*   Basic Metabolic Panel: Recent Labs  Lab 09/07/19 1700 09/08/19 0545 09/09/19 0949 09/09/19 1658 09/10/19 0528 09/12/19 0450  NA  --  150* 143  --  139 142  K  --  5.1 5.1  --  4.5 5.8*  CL  --  112* 105  --  101 107  CO2  --  26 25  --  27 21*  GLUCOSE  --  256* 386*  --  219* 192*  BUN  --  66* 85*  --  80* 38*  CREATININE  --  2.27* 3.31* 3.05* 2.61* 1.21  CALCIUM  --  10.4* 9.4  --  9.2 9.3  MG 2.6* 2.3  --   --  2.3  --   PHOS 5.2* 4.1  --   --  4.8*  --    GFR: Estimated Creatinine Clearance: 54 mL/min (by C-G formula based on SCr of 1.21 mg/dL). Liver Function Tests: Recent Labs  Lab 09/10/19 0528  AST 51*  ALT 22  ALKPHOS 64  BILITOT 0.9  PROT 5.4*  ALBUMIN 2.3*   No results for input(s): LIPASE, AMYLASE in the last 168 hours. No results for input(s): AMMONIA in the last 168 hours. Coagulation Profile: No results for input(s): INR, PROTIME in the last 168 hours. Cardiac Enzymes: No results for input(s): CKTOTAL,  CKMB, CKMBINDEX, TROPONINI in the last 168 hours. BNP (last 3 results) No results for input(s): PROBNP in the last 8760 hours. HbA1C: No results for input(s): HGBA1C in the last 72 hours. CBG: Recent Labs  Lab 09/13/19 2005 09/14/19 0030 09/14/19 0425 09/14/19 0813 09/14/19 1206  GLUCAP 90 132* 150* 149* 163*   Lipid Profile: No results for input(s): CHOL, HDL, LDLCALC, TRIG, CHOLHDL, LDLDIRECT in the last 72 hours. Thyroid Function Tests: No results for input(s): TSH, T4TOTAL, FREET4, T3FREE, THYROIDAB in the last 72 hours. Anemia Panel: No results for input(s): VITAMINB12, FOLATE, FERRITIN, TIBC, IRON, RETICCTPCT in the last 72 hours. Sepsis Labs: No results for input(s): PROCALCITON, LATICACIDVEN in the last 168 hours.  Recent Results (from the past 240 hour(s))  MRSA PCR Screening     Status: None   Collection Time: 09/06/19  1:14 AM   Specimen: Nasal Mucosa; Nasopharyngeal  Result Value Ref Range Status   MRSA by PCR NEGATIVE NEGATIVE Final    Comment:        The GeneXpert MRSA Assay (FDA approved for NASAL specimens only), is one component of a comprehensive MRSA colonization surveillance program. It is not intended to diagnose MRSA infection nor to guide or monitor treatment for MRSA infections. Performed at [facility], [place] [address] [place], [place] [address]          Radiology Studies: No results found.      Scheduled Meds: . amLODipine  5 mg Oral Daily  . aspirin  81 mg Per Tube Daily  . atorvastatin  40 mg Per Tube q1800  . chlorhexidine  15 mL Mouth Rinse BID  . dextrose  50 mL Intravenous Once  . docusate  100 mg Oral BID  . feeding supplement (PRO-STAT SUGAR FREE 64)  30 mL Per Tube BID  . heparin  5,000 Units Subcutaneous Q8H  . insulin aspart  0-15 Units Subcutaneous TID AC & HS  . levETIRAcetam  500 mg Per Tube BID  . mouth rinse  15 mL Mouth Rinse q12n4p   Continuous Infusions: . ampicillin-sulbactam (UNASYN) IV 3 g  (09/14/19 1335)  . dextrose 75 mL/hr at 09/14/19 1006  . feeding supplement (OSMOLITE 1.5 CAL)       LOS: 5 days    Time spent:35 mins. More than 50% of that time was spent in counseling and/or coordination of care.      Shelly Coss, MD [facility] P2/05/2020, 1:51 PM

## 2019-09-14 NOTE — TOC Initial Note (Signed)
Transition of Care [facility]) - Initial/Assessment Note    Patient Details  Name: Andres [name] MRN: [id number] Date of Birth: [birth year]  Transition [facility]) CM/SW Contact:    Marilu Favre, RN Phone Number: 09/14/2019, 3:58 PM  Clinical Narrative:                 NCM and SW spoke with patient's daughter [name]. [name] lives in [place]. Patient has a son in [place]. [name] would like to take patient to SNF in [place] at discharge.   When patient was in [place] she had plan to take her father to [place] from there. She has a SNF in SNF she would like her father to go to. She also had spoken to [facility] to discuss transporting her father to [place] , she understands that would be private pay.   TOC explained we will contact Debra in CIR to get information on SNF in [place] to fax updated information to see if they can offer a bed . Also, will need to submit insurance authorization. [name] willing to pay if insurance does not cover SNF.   It was explained to [name] the ambulance ride to [place] would have risks.     Will continue to follow.   Expected Discharge Plan: [place] Barriers to Discharge: Continued Medical Work up, Ship broker   Patient Goals and CMS Choice Patient states their goals for this hospitalization and ongoing recovery are:: return to [place] and go to a skilled nursing facility [employer]gov Compare Post Acute Care list provided to:: Patient Represenative (must comment)(pt daughter [name]) Choice offered to / list presented to : Adult Children  Expected Discharge Plan and Services Expected Discharge Plan: [place] In-house Referral: Clinical Social Work Discharge Planning Services: [place] Acute Care Choice: [place] arrangements for the past 2 months: [place]                 DME Arranged: N/A         HH Arranged: NA          Prior Living  Arrangements/Services Living arrangements for the past 2 months: Single Family Home Lives with:: Self Patient language and need for interpreter reviewed:: Yes(no needs) Do you feel safe going back to the place where you live?: No   plan for SNF placement in [place] for further care  Need for Family Participation in Patient Care: Yes (Comment)(assistance with daily cares; support with decision making) Care giver support system in place?: Yes (comment)(pt daughter [name])   Criminal Activity/Legal Involvement Pertinent to Current Situation/Hospitalization: No - Comment as needed  Activities of Daily Living      Permission Sought/Granted Permission sought to share information with : Family Supports, Chartered certified accountant granted to share information with : Yes, Release of Information Signed  Share Information with NAME: Markal Cedillos     Permission granted to share info w Relationship: daughter  Permission granted to share info w Contact Information: [phone]  Emotional Assessment Appearance:: Other (Comment Required(assessment completed with pt daughter [name]) Attitude/Demeanor/Rapport: Other (comment)(assessment completed with pt daughter [name]) Affect (typically observed): Other (comment)(assessment completed with pt daughter [name]) Orientation: : Fluctuating Orientation (Suspected and/or reported Sundowners)(as documented by RN flowsheets) Alcohol / Substance Use: Not Applicable Psych Involvement: No (comment)(not applicable)  Admission diagnosis:  Pneumonia [J18.9] Patient Active Problem List   Diagnosis Date Noted  . Adult failure to thrive   . At high risk for  aspiration   . Palliative care by specialist   . DNR (do not resuscitate)   . Respiratory failure, acute ([place]) 09/09/2019  . Pneumonia 09/09/2019  . Acute blood loss anemia   . Labile blood pressure   . Diabetes mellitus type 2 in obese ([place])   . Right middle cerebral artery stroke ([place])  08/19/2019  . Seizures ([place])   . Stage 3 chronic kidney disease   . Dysphagia   . Coagulase negative Staphylococcus bacteremia 08/15/2019  . Acute ischemic stroke ([place]) 08/09/2019  . Transaminitis 08/09/2019  . History of seizures 08/09/2019  . Left hip pain 03/02/2019  . Syncope 09/05/2018  . Gout 07/10/2018  . Scrotal swelling 06/09/2018  . Urinary dribbling 06/03/2017  . Mass of scrotum 06/03/2017  . Injury of left hand 07/21/2015  . Hx of   sessile serrated colonic polyp 02/27/2015  . Chronic anticoagulation 12/22/2014  . Colon cancer screening 12/22/2014  . Morbid obesity ([place]) 09/29/2014  . Mitral valve prolapse 11/24/2013  . IT band syndrome 09/23/2013  . Hypotension, unspecified 07/21/2013  . Dyspnea 07/21/2013  . Elevated troponin I level- (pk Troponin 0.34 in setting of acute renal insufficency) 07/21/2013  . 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 Last nuc 2012 negative. 07/21/2013  . SBO (small bowel obstruction) ([place]) 07/21/2013  . Inguinal hernia, Rt reduced, Lt present 07/21/2013  . Acute renal failure ([place]) 07/21/2013  . Pneumonia, aspiration ([place]) 07/21/2013  . Obesity (BMI 30-39.9) 01/17/2013  . Osteoarthritis, hand, primary localized 08/12/2012  . Preventative health care 04/11/2011  . MILD COGNITIVE IMPAIRMENT SO STATED 10/03/2010  . CEREBROVASCULAR ACCIDENT, HX OF 10/03/2010  . RASH-NONVESICULAR 05/12/2008  . TRANSIENT ISCHEMIC ATTACK 01/27/2008  . Diabetes ([place]) 12/08/2007  . ALLERGIC RHINITIS 12/08/2007  . DIVERTICULOSIS, COLON 12/08/2007  . LOW BACK PAIN 12/08/2007  . NEPHROLITHIASIS, HX OF 12/08/2007  . BPH (benign prostatic hyperplasia) 04/05/2007  . Hyperlipidemia 04/02/2007  . CARPAL TUNNEL SYNDROME, BILATERAL 04/02/2007  . CATARACT NOS 04/02/2007  . Essential hypertension 04/02/2007   PCP:  [name], MD Pharmacy:   [facility] [place], [place] [address] Phone: [phone] Fax: [phone]     Social Determinants of Health (SDOH) Interventions    Readmission Risk Interventions No flowsheet data found.

## 2019-09-14 NOTE — Progress Notes (Signed)
Pt resting quietly but has had notable increase in abdominal girth this shift. Abdomen is very distended, pt does not appear to be in any distress.

## 2019-09-14 NOTE — Progress Notes (Signed)
Patient ID: Andres [name], male   DOB: January 25, 1941, 79 y.o.   MRN: [id number]  This NP visited patient at the bedside as a follow up to  yesterday's [place] and to meet with daughter as scheduled today.   Patient was dyspneic and agitated.  Discussed with nursing need for prn  medication to address symptoms. Patient responded well to low dose morphine and ativan and is able to rest comfortably.  Created space and opportunity for daughter to explore thoughts and feelings regarding current medical situation.     Values and goals of care important to the family were attempted to be elicited.  Continue conversation regarding current medical situation.    We discussed the difference between an aggressive medical intervention path and a palliative comfort path for this patient at this time in this situation.  I shared with daughter my medical opinion that the patient's prognosis is likely days to weeks depending on her decision to continue life prolonging medical interventions     Education offered on the natural trajectory and expectations at EOL,   I educated family that the PEG tube could be used for fluids, eliminating need for IV hydration.    Emotional support offered.  Questions and concerns addressed.  I verbalized my concern that anything could happen at any time.   Plan of Care -DNR/DNI -Continue IV fluids/consider PEG for fluids -Requesting prostat to be continued via PEG, no other feeding formula/tube feed requested -Low dose medications for symptom management -Continue to monitor and make transition of care decisions dependent on outcomes. -Daughter was able to speak with hospice liaison yesterday  Questions and concerns addressed   Discussed with Dr Tawanna Solo and Isabel/LCSW  This nurse practitioner informed  the family and the attending that I will be out of the hospital until Monday morning.  If the patient is still hospitalized I will follow-up at that time.  Call  palliative medicine team phone # [phone] with questions or concerns.  Total time spent on the unit was 35 minutes    Palliative  medicine team will continue to support holistically  Greater than 50% of the time was spent in counseling and coordination of care  Wadie Lessen NP  Palliative Medicine Team Team Phone # [phone] Pager [phone]

## 2019-09-14 NOTE — Plan of Care (Signed)
  Problem: Education: Goal: Knowledge of General Education information will improve Description: Including pain rating scale, medication(s)/side effects and non-pharmacologic comfort measures 09/14/2019 0016 by Bertram Denver, RN Outcome: Not Progressing 09/14/2019 0015 by Bertram Denver, RN Outcome: Not Progressing   Problem: Health Behavior/Discharge Planning: Goal: Ability to manage health-related needs will improve 09/14/2019 0016 by Bertram Denver, RN Outcome: Not Progressing 09/14/2019 0015 by Bertram Denver, RN Outcome: Not Progressing   Problem: Clinical Measurements: Goal: Ability to maintain clinical measurements within normal limits will improve 09/14/2019 0016 by Bertram Denver, RN Outcome: Not Progressing 09/14/2019 0015 by Bertram Denver, RN Outcome: Not Progressing Goal: Will remain free from infection 09/14/2019 0016 by Bertram Denver, RN Outcome: Not Progressing 09/14/2019 0015 by Bertram Denver, RN Outcome: Not Progressing Goal: Diagnostic test results will improve 09/14/2019 0016 by Bertram Denver, RN Outcome: Not Progressing 09/14/2019 0015 by Bertram Denver, RN Outcome: Not Progressing Goal: Respiratory complications will improve 09/14/2019 0016 by Bertram Denver, RN Outcome: Not Progressing 09/14/2019 0015 by Bertram Denver, RN Outcome: Not Progressing Goal: Cardiovascular complication will be avoided 09/14/2019 0016 by Bertram Denver, RN Outcome: Not Progressing 09/14/2019 0015 by Bertram Denver, RN Outcome: Not Progressing   Problem: Activity: Goal: Risk for activity intolerance will decrease 09/14/2019 0016 by Bertram Denver, RN Outcome: Not Progressing 09/14/2019 0015 by Bertram Denver, RN Outcome: Not Progressing   Problem: Nutrition: Goal: Adequate nutrition will be maintained 09/14/2019 0016 by Bertram Denver, RN Outcome: Not Progressing 09/14/2019 0015 by Bertram Denver, RN Outcome: Not Progressing   Problem: Coping: Goal: Level of anxiety will  decrease 09/14/2019 0016 by Bertram Denver, RN Outcome: Not Progressing 09/14/2019 0015 by Bertram Denver, RN Outcome: Not Progressing   Problem: Elimination: Goal: Will not experience complications related to urinary retention 09/14/2019 0016 by Bertram Denver, RN Outcome: Not Progressing 09/14/2019 0015 by Bertram Denver, RN Outcome: Not Progressing   Problem: Pain Managment: Goal: General experience of comfort will improve 09/14/2019 0016 by Bertram Denver, RN Outcome: Not Progressing 09/14/2019 0015 by Bertram Denver, RN Outcome: Not Progressing   Problem: Safety: Goal: Ability to remain free from injury will improve 09/14/2019 0016 by Bertram Denver, RN Outcome: Not Progressing 09/14/2019 0015 by Bertram Denver, RN Outcome: Not Progressing   Problem: Skin Integrity: Goal: Risk for impaired skin integrity will decrease 09/14/2019 0016 by Bertram Denver, RN Outcome: Not Progressing 09/14/2019 0015 by Bertram Denver, RN Outcome: Not Progressing

## 2019-09-14 NOTE — Social Work (Signed)
CSW received call from pt daughter [name] at [phone]. Called her back, she would like to speak with this Probation officer today, aware pt daughter has met with Palliative Medicine Team, care continues for pt here at hospital.   Pt daughter amenable to meeting with RNCM and CSW at 3:00pm.   Westley Hummer, MSW, [place] Work

## 2019-09-15 DIAGNOSIS — R451 Restlessness and agitation: Secondary | ICD-10-CM

## 2019-09-15 LAB — CBC WITH DIFFERENTIAL/PLATELET
Abs Immature Granulocytes: 0.15 10*3/uL — ABNORMAL HIGH (ref 0.00–0.07)
Basophils Absolute: 0 10*3/uL (ref 0.0–0.1)
Basophils Relative: 0 %
Eosinophils Absolute: 0.1 10*3/uL (ref 0.0–0.5)
Eosinophils Relative: 1 %
HCT: 31.8 % — ABNORMAL LOW (ref 39.0–52.0)
Hemoglobin: 10.2 g/dL — ABNORMAL LOW (ref 13.0–17.0)
Immature Granulocytes: 3 %
Lymphocytes Relative: 28 %
Lymphs Abs: 1.4 10*3/uL (ref 0.7–4.0)
MCH: 31.6 pg (ref 26.0–34.0)
MCHC: 32.1 g/dL (ref 30.0–36.0)
MCV: 98.5 fL (ref 80.0–100.0)
Monocytes Absolute: 0.3 10*3/uL (ref 0.1–1.0)
Monocytes Relative: 6 %
Neutro Abs: 3.1 10*3/uL (ref 1.7–7.7)
Neutrophils Relative %: 62 %
Platelets: 194 10*3/uL (ref 150–400)
RBC: 3.23 MIL/uL — ABNORMAL LOW (ref 4.22–5.81)
RDW: 12.2 % (ref 11.5–15.5)
WBC: 5 10*3/uL (ref 4.0–10.5)
nRBC: 0 % (ref 0.0–0.2)

## 2019-09-15 LAB — GLUCOSE, CAPILLARY
Glucose-Capillary: 155 mg/dL — ABNORMAL HIGH (ref 70–99)
Glucose-Capillary: 150 mg/dL — ABNORMAL HIGH (ref 70–99)
Glucose-Capillary: 155 mg/dL — ABNORMAL HIGH (ref 70–99)
Glucose-Capillary: 120 mg/dL — ABNORMAL HIGH (ref 70–99)
Glucose-Capillary: 107 mg/dL — ABNORMAL HIGH (ref 70–99)
Glucose-Capillary: 134 mg/dL — ABNORMAL HIGH (ref 70–99)
Glucose-Capillary: 129 mg/dL — ABNORMAL HIGH (ref 70–99)

## 2019-09-15 LAB — BASIC METABOLIC PANEL
Anion gap: 13 (ref 5–15)
BUN: 26 mg/dL — ABNORMAL HIGH (ref 8–23)
CO2: 25 mmol/L (ref 22–32)
Calcium: 8.9 mg/dL (ref 8.9–10.3)
Chloride: 98 mmol/L (ref 98–111)
Creatinine, Ser: 1.46 mg/dL — ABNORMAL HIGH (ref 0.61–1.24)
GFR calc Af Amer: 52 mL/min — ABNORMAL LOW (ref >60)
GFR calc non Af Amer: 45 mL/min — ABNORMAL LOW (ref >60)
Glucose, Bld: 183 mg/dL — ABNORMAL HIGH (ref 70–99)
Potassium: 4 mmol/L (ref 3.5–5.1)
Sodium: 136 mmol/L (ref 135–145)

## 2019-09-15 NOTE — Progress Notes (Signed)
PROGRESS NOTE    Andres [name]  [id number] DOB: 01/24/1941 DOA: 09/09/2019 PCP: [name], MD   Brief Narrative:  Patient is a 79 year old male with past medical history of hypertension, seizure disorder, chronic kidney disease stage III, hyperlipidemia,coronary artery disease was recently admitted here for right MCA stroke with left-sided weakness and dysphagia and was discharged to rehabilitation facility.  He came back with progressive worsening shortness of breath and found to have right lower lobe pneumonia.  Had leukocytosis on presentation.  He was hypoxic and confused.  Patient was admitted for the management of acute respiratory failure due to pneumonia with acute metabolic encephalopathy.  After discussion with family, palliative care has been consulted.  Discussion was going on for initiating full  comfort care vs transfer to residential hospice .  Palliative care was closely following.Daughter remains undecided about his further care.  Plan for skilled nursing facility placement.   Assessment & Plan:   Principal Problem:   Respiratory failure, acute ([place]) Active Problems:   Diabetes ([place])   Hyperlipidemia   Essential hypertension   Pneumonia, aspiration ([place])   Acute ischemic stroke ([place])   Dysphagia   Pneumonia   At high risk for aspiration   Palliative care by specialist   DNR (do not resuscitate)   Adult failure to thrive   Agitation   Acute respiratory failure with hypoxia: Most likely secondary to aspiration pneumonia.  Currently he is n.p.o. . He is on Unasyn.  Currently on 4-5 L of oxygen per minute.  We will try to taper the oxygen down.  Acute metabolic encephalopathy: Multifactorial secondary to stroke, respiratory failure, renal dysfunction.  He remains mostly  Unresponsive.    Status post CVA/dysphagia: Has PEG tube.  N.p.o.  And he has left-sided residual weakness.  Patient remains unresponsive.  Poor prognosis/ethics: Palliative care following.   Poor prognosis, we have been recommending hospice approach.  Discussion going on for initing full  comfort care vs transfer to residential hospice .  Palliative care closely following.Daughter indecisive.  Hyperkalemia:resolved  Seizure disorder: On Keppra.  CKD stage III: Currently kidney function at baseline.  Hypoglycemia: On D5.Monitor CBGs  Diabetes type 2: On sliding scale  insulin.  Nutrition Problem: Inadequate oral intake Etiology: inability to eat      DVT prophylaxis: Heparin [place] Code Status: DNR Family Communication: Discussed with daughter on bedside on 09/14/19 Disposition Plan: Patient is from a facility.  We have recommended residential hospice versus initiating comfort care here.  Plan for skilled nursing facility now.  Can be discharged to skilled nursing facility as soon as bed is available.   Consultants: None  Procedures:None  Antimicrobials:  Anti-infectives (From admission, onward)   Start     Dose/Rate Route Frequency Ordered Stop   09/13/19 1300  Ampicillin-Sulbactam (UNASYN) 3 g in sodium chloride 0.9 % 100 mL IVPB     3 g 200 mL/hr over 30 Minutes Intravenous Every 8 hours 09/13/19 1018     09/12/19 1130  ampicillin-sulbactam (UNASYN) 1.5 g in sodium chloride 0.9 % 100 mL IVPB  Status:  Discontinued     1.5 g 200 mL/hr over 30 Minutes Intravenous Every 6 hours 09/12/19 0920 09/13/19 1018   09/09/19 1730  ampicillin-sulbactam (UNASYN) 1.5 g in sodium chloride 0.9 % 100 mL IVPB  Status:  Discontinued     1.5 g 200 mL/hr over 30 Minutes Intravenous Every 12 hours 09/09/19 1713 09/12/19 0920      Subjective: Patient seen and examined  at the bedside this morning.  Hemodynamically stable but remains largely unresponsive.  Opens her eyes on calling his name but remains nonverbal and does not show any purposeful movement.  Objective: Vitals:   09/14/19 0819 09/14/19 1428 09/14/19 1936 09/15/19 0428  BP: 131/73 (!) 141/75 (!) 143/73 129/80  Pulse: 91  94 98 64  Resp: 20 (!) 21 18 20   Temp: 97.8 F (36.6 C) 97.9 F (36.6 C) 98.9 F (37.2 C) 97.9 F (36.6 C)  TempSrc: Oral Oral Oral Oral  SpO2: 100% 92% 91% 90%  Weight:        Intake/Output Summary (Last 24 hours) at 09/15/2019 1350 Last data filed at 09/15/2019 0900 Gross per 24 hour  Intake 0 ml  Output 700 ml  Net -700 ml   Filed Weights   09/10/19 0500 09/13/19 0500 09/14/19 0500  Weight: 90.8 kg 93.1 kg 96.8 kg    Examination:   General exam: Unresponsive Respiratory system: mild cackles heard   cardiovascular system: S1 & S2 heard, RRR.  Gastrointestinal system: Abdomen is nondistended, soft and nontender. Central nervous system:Not  Alert or oriented. Extremities: No edema, no clubbing ,no cyanosis Skin: No rashes, lesions or ulcers,no icterus ,no pallor      Data Reviewed: I have personally reviewed following labs and imaging studies  CBC: Recent Labs  Lab 09/09/19 1658 09/10/19 0528 09/15/19 0237  WBC 7.5 7.1 5.0  NEUTROABS  --   --  3.1  HGB 10.5* 10.7* 10.2*  HCT 32.5* 32.8* 31.8*  MCV 103.2* 100.6* 98.5  PLT 156 149* [id number]   Basic Metabolic Panel: Recent Labs  Lab 09/09/19 0949 09/09/19 1658 09/10/19 0528 09/12/19 0450 09/15/19 0237  NA 143  --  139 142 136  K 5.1  --  4.5 5.8* 4.0  CL 105  --  101 107 98  CO2 25  --  27 21* 25  GLUCOSE 386*  --  219* 192* 183*  BUN 85*  --  80* 38* 26*  CREATININE 3.31* 3.05* 2.61* 1.21 1.46*  CALCIUM 9.4  --  9.2 9.3 8.9  MG  --   --  2.3  --   --   PHOS  --   --  4.8*  --   --    GFR: Estimated Creatinine Clearance: 44.7 mL/min (A) (by C-G formula based on SCr of 1.46 mg/dL (H)). Liver Function Tests: Recent Labs  Lab 09/10/19 0528  AST 51*  ALT 22  ALKPHOS 64  BILITOT 0.9  PROT 5.4*  ALBUMIN 2.3*   No results for input(s): LIPASE, AMYLASE in the last 168 hours. No results for input(s): AMMONIA in the last 168 hours. Coagulation Profile: No results for input(s): INR, PROTIME in the  last 168 hours. Cardiac Enzymes: No results for input(s): CKTOTAL, CKMB, CKMBINDEX, TROPONINI in the last 168 hours. BNP (last 3 results) No results for input(s): PROBNP in the last 8760 hours. HbA1C: No results for input(s): HGBA1C in the last 72 hours. CBG: Recent Labs  Lab 09/14/19 1939 09/14/19 2324 09/15/19 0417 09/15/19 0807 09/15/19 1205  GLUCAP 103* 155* 150* 155* 120*   Lipid Profile: No results for input(s): CHOL, HDL, LDLCALC, TRIG, CHOLHDL, LDLDIRECT in the last 72 hours. Thyroid Function Tests: No results for input(s): TSH, T4TOTAL, FREET4, T3FREE, THYROIDAB in the last 72 hours. Anemia Panel: No results for input(s): VITAMINB12, FOLATE, FERRITIN, TIBC, IRON, RETICCTPCT in the last 72 hours. Sepsis Labs: No results for input(s): PROCALCITON, LATICACIDVEN in the last 168  hours.  Recent Results (from the past 240 hour(s))  MRSA PCR Screening     Status: None   Collection Time: 09/06/19  1:14 AM   Specimen: Nasal Mucosa; Nasopharyngeal  Result Value Ref Range Status   MRSA by PCR NEGATIVE NEGATIVE Final    Comment:        The GeneXpert MRSA Assay (FDA approved for NASAL specimens only), is one component of a comprehensive MRSA colonization surveillance program. It is not intended to diagnose MRSA infection nor to guide or monitor treatment for MRSA infections. Performed at [facility], [address] [place], [place] [address]          Radiology Studies: No results found.      Scheduled Meds: . amLODipine  5 mg Oral Daily  . aspirin  81 mg Per Tube Daily  . atorvastatin  40 mg Per Tube q1800  . chlorhexidine  15 mL Mouth Rinse BID  . dextrose  50 mL Intravenous Once  . docusate  100 mg Oral BID  . feeding supplement (PRO-STAT SUGAR FREE 64)  30 mL Per Tube BID  . heparin  5,000 Units Subcutaneous Q8H  . insulin aspart  0-15 Units Subcutaneous TID AC & HS  . levETIRAcetam  500 mg Per Tube BID  . mouth rinse  15 mL Mouth Rinse q12n4p    Continuous Infusions: . ampicillin-sulbactam (UNASYN) IV 3 g (09/15/19 1238)  . dextrose 75 mL/hr at 09/15/19 0055  . feeding supplement (OSMOLITE 1.5 CAL)       LOS: 6 days    Time spent:35 mins. More than 50% of that time was spent in counseling and/or coordination of care.      Shelly Coss, MD [facility] P2/06/2020, 1:50 PM

## 2019-09-15 NOTE — TOC Progression Note (Addendum)
Transition of Care [facility]) - Progression Note    Patient Details  Name: Andres [name] MRN: [id number] Date of Birth: [birth year]  Transition of Care [facility]) CM/SW [place], [place] Phone Number:  09/15/2019, 10:23 AM  Clinical Narrative:    4:05pm- CSW called and spoke with Fransisco Beau; they have referral and are reviewing along with his insurance information. They will be in contact with this Probation officer. CSW then called and spoke with pt daughter [name] via telephone. She had spoken with Leafy Ro at [employer]. She is aware that we are still waiting for facility to complete the review. She also mentions pt has LTC insurance, will update SNF with that tomorrow and put them in contact with [name] if they need further information about that. TOC team will be here tomorrow to further assist with referral.   2:14pm- CSW received a call from [place] and [place], admissions at [facility] regarding fax; they state the fax was interrupted- requested CSW securely email clinicals to [email or web address]. CSW has securely sent information requested. Let them know again that pt daughter interested in placement for skilled nursing. Tammy and Fransisco Beau have this writers information to contact once they have reviewed clinical information. CSW also provided pt daughter Donna's phone number if they had any additional questions for family   1:07pm- CSW called Financial risk analyst at [employer] in [place]. Mandy had given estimate for ride when initially planned. CSW explained that pt daughter had questions about oxygen and catheter. [facility] able manage both oxygen and foley/condom catheter; Mandy able to call pt daughter and explain what services their ambulance could provide when they transport pt.   12:46pm- CSW received a call from pt daughter [name], provided her an update that we have sent clinicals to [facility] with [employer]. [name] also inquired about oxygen in the ambulance as  well as catheter for pt to be able to use the bathroom in transport. Pt currently on nasal canula 5L per Joaquim Lai, bedside RN. CSW will call [facility] and see if they can answer both questions.   12:36pm- Spoke with Nicole Kindred at [facility] with [facility] Medicare. Introduced self, role, reason for call. He was provided with date of acute transfer which is documented as 2/5. He states that the SNF would be the one to submit for authorization if they are able to accept pt. Nicole Kindred also let CSW know to notify him when Josem Kaufmann has been submitted to see if they are able to expedite review.   12:27pm- CSW faxed the following clinicals to [facility] at [phone] [facility], H&P (2/5), Med List (2/11), FL2 (2/3), OT note (2/3), PT note (2/4), SLP (2/4), Dieticians notes (2/6, 2/8), MD Progress Notes (2/8, 2/9, 2/10), SW note (2/10). Await confirmation notice.    10:45am- Spoke with Fransisco Beau at [facility] [phone]) introduced self, role, reason for call. Let her know that pt family still desires placement at their SNF. She requested clinicals be sent to her fax [phone]). Provided Fransisco Beau with my name and contact number also.   10:23am- CSW spoke with Neoma Laming, RNCM from [facility].   She had begun process for placement prior to transition to acute floor. She had referred pt at [facility] request to [facility] in [place], [place]. She had been working with [place] and [place]  [phone]). Arboriculturist [phone]) in [place] had provided an estimate of $3125.88 with $2,300 up front per Deborah's conversation with a representative of [place] named Leafy Ro. They will need at  least 48 hrs to plan for transport.   Pt insurance is managed by [employer], a representative named Nicole Kindred has been following ([phone]).  Will call and send additional clinicals to SNF for review.     Expected Discharge Plan: [place] Barriers to  Discharge: Continued Medical Work up, Ship broker  Expected Discharge Plan and Services Expected Discharge Plan: [place] In-house Referral: Clinical Social Work Discharge Planning Services: CM Consult Post Acute Care Choice: [place] Living arrangements for the past 2 months: Single Family Home            DME Arranged: N/A HH Arranged: NA   Readmission Risk Interventions No flowsheet data found.

## 2019-09-16 ENCOUNTER — Inpatient Hospital Stay (HOSPITAL_COMMUNITY): Payer: Medicare (Managed Care)

## 2019-09-16 LAB — GLUCOSE, CAPILLARY
Glucose-Capillary: 134 mg/dL — ABNORMAL HIGH (ref 70–99)
Glucose-Capillary: 130 mg/dL — ABNORMAL HIGH (ref 70–99)
Glucose-Capillary: 139 mg/dL — ABNORMAL HIGH (ref 70–99)
Glucose-Capillary: 78 mg/dL (ref 70–99)
Glucose-Capillary: 85 mg/dL (ref 70–99)

## 2019-09-16 NOTE — Progress Notes (Signed)
PROGRESS NOTE    Andres [name]  [id number] DOB: 02/14/1941 DOA: 09/09/2019 PCP: [name], MD   Brief Narrative:  Patient is a 79 year old male with past medical history of hypertension, seizure disorder, chronic kidney disease stage III, hyperlipidemia,coronary artery disease was recently admitted here for right MCA stroke with left-sided weakness and dysphagia and was discharged to rehabilitation facility.  He came back with progressive worsening shortness of breath and found to have right lower lobe pneumonia.  Had leukocytosis on presentation.  He was hypoxic and confused.  Patient was admitted for the management of acute respiratory failure due to pneumonia with acute metabolic encephalopathy.  After discussion with family, palliative care has been consulted.  Discussion was going on for initiating full  comfort care vs transfer to residential hospice .  Palliative care was closely following.Daughter remains undecided about his further care.  Plan for skilled nursing facility placement on [place].  Discharge planning on Monday.   Assessment & Plan:   Principal Problem:   Respiratory failure, acute ([place]) Active Problems:   Diabetes ([place])   Hyperlipidemia   Essential hypertension   Pneumonia, aspiration ([place])   Acute ischemic stroke ([place])   Dysphagia   Pneumonia   At high risk for aspiration   Palliative care by specialist   DNR (do not resuscitate)   Adult failure to thrive   Agitation   Acute respiratory failure with hypoxia: Most likely secondary to aspiration pneumonia.  Currently he is n.p.o. . He is on Unasyn.  Currently on 3 L of oxygen per minute.  We will try to taper the oxygen down. This morning the chest x-ray showed new left lower lobe opacity.  Continue current antibiotic.  Previous right lower lobe opacity has resolved.  IV fluids discontinued.  Acute metabolic encephalopathy: Multifactorial secondary to stroke, respiratory failure, renal dysfunction.  He  remains mostly  Unresponsive.    Status post CVA/dysphagia: Has PEG tube.  N.p.o.  And he has left-sided residual weakness.  Patient remains unresponsive.  Poor prognosis/ethics: Palliative care following.  Poor prognosis, we have been recommending hospice approach.  Discussion going on for initing full  comfort care vs transfer to residential hospice .  Palliative care closely following.Daughter indecisive.  Hyperkalemia:resolved  Seizure disorder: On Keppra.  CKD stage III: Currently kidney function at baseline.  Hypoglycemia: .Monitor CBGs  Diabetes type 2: On sliding scale  insulin.  Nutrition Problem: Inadequate oral intake Etiology: inability to eat      DVT prophylaxis: Heparin  Code Status: DNR Family Communication: Discussed with daughter on bedside on 09/14/19 Disposition Plan: Patient is from a facility.  We have recommended residential hospice versus initiating comfort care here.  Plan for skilled nursing facility now.  Can be discharged to skilled nursing facility as soon as bed is available.Will be D/ced on  Monday.   Consultants: None  Procedures:None  Antimicrobials:  Anti-infectives (From admission, onward)   Start     Dose/Rate Route Frequency Ordered Stop   09/13/19 1300  Ampicillin-Sulbactam (UNASYN) 3 g in sodium chloride 0.9 % 100 mL IVPB     3 g 200 mL/hr over 30 Minutes Intravenous Every 8 hours 09/13/19 1018     09/12/19 1130  ampicillin-sulbactam (UNASYN) 1.5 g in sodium chloride 0.9 % 100 mL IVPB  Status:  Discontinued     1.5 g 200 mL/hr over 30 Minutes Intravenous Every 6 hours 09/12/19 0920 09/13/19 1018   09/09/19 1730  ampicillin-sulbactam (UNASYN) 1.5 g in sodium chloride  0.9 % 100 mL IVPB  Status:  Discontinued     1.5 g 200 mL/hr over 30 Minutes Intravenous Every 12 hours 09/09/19 1713 09/12/19 0920      Subjective: Patient seen and examined at the bedside this morning.  Remains unresponsive but opens his eyes on giving painful  stimulus.  He was having some crackles which was audible.  IV fluids stopped  Objective: Vitals:   09/15/19 1507 09/15/19 1945 09/16/19 0403 09/16/19 0500  BP: 112/66 134/71 113/67   Pulse: 79 75 79   Resp: 20 20 18    Temp: 98.1 F (36.7 C) 98 F (36.7 C) (!) 97.3 F (36.3 C)   TempSrc: Axillary Oral Oral   SpO2: 100% 100% 99%   Weight:    97.1 kg    Intake/Output Summary (Last 24 hours) at 09/16/2019 0837 Last data filed at 09/16/2019 0510 Gross per 24 hour  Intake 958.48 ml  Output 1825 ml  Net -866.52 ml   Filed Weights   09/13/19 0500 09/14/19 0500 09/16/19 0500  Weight: 93.1 kg 96.8 kg 97.1 kg    Examination:   General exam: Unresponsive Respiratory system:  cackles heard bilaterally   cardiovascular system: S1 & S2 heard, RRR.  Gastrointestinal system: Abdomen is nondistended, soft and nontender. Central nervous system:Not  Alert or oriented. Extremities: No edema, no clubbing ,no cyanosis Skin: No rashes, lesions or ulcers,no icterus ,no pallor      Data Reviewed: I have personally reviewed following labs and imaging studies  CBC: Recent Labs  Lab 09/09/19 1658 09/10/19 0528 09/15/19 0237  WBC 7.5 7.1 5.0  NEUTROABS  --   --  3.1  HGB 10.5* 10.7* 10.2*  HCT 32.5* 32.8* 31.8*  MCV 103.2* 100.6* 98.5  PLT 156 149* [id number]   Basic Metabolic Panel: Recent Labs  Lab 09/09/19 0949 09/09/19 1658 09/10/19 0528 09/12/19 0450 09/15/19 0237  NA 143  --  139 142 136  K 5.1  --  4.5 5.8* 4.0  CL 105  --  101 107 98  CO2 25  --  27 21* 25  GLUCOSE 386*  --  219* 192* 183*  BUN 85*  --  80* 38* 26*  CREATININE 3.31* 3.05* 2.61* 1.21 1.46*  CALCIUM 9.4  --  9.2 9.3 8.9  MG  --   --  2.3  --   --   PHOS  --   --  4.8*  --   --    GFR: Estimated Creatinine Clearance: 44.8 mL/min (A) (by C-G formula based on SCr of 1.46 mg/dL (H)). Liver Function Tests: Recent Labs  Lab 09/10/19 0528  AST 51*  ALT 22  ALKPHOS 64  BILITOT 0.9  PROT 5.4*  ALBUMIN  2.3*   No results for input(s): LIPASE, AMYLASE in the last 168 hours. No results for input(s): AMMONIA in the last 168 hours. Coagulation Profile: No results for input(s): INR, PROTIME in the last 168 hours. Cardiac Enzymes: No results for input(s): CKTOTAL, CKMB, CKMBINDEX, TROPONINI in the last 168 hours. BNP (last 3 results) No results for input(s): PROBNP in the last 8760 hours. HbA1C: No results for input(s): HGBA1C in the last 72 hours. CBG: Recent Labs  Lab 09/15/19 1627 09/15/19 1948 09/15/19 2307 09/16/19 0405 09/16/19 0743  GLUCAP 107* 134* 129* 134* 130*   Lipid Profile: No results for input(s): CHOL, HDL, LDLCALC, TRIG, CHOLHDL, LDLDIRECT in the last 72 hours. Thyroid Function Tests: No results for input(s): TSH, T4TOTAL, FREET4, T3FREE, THYROIDAB  in the last 72 hours. Anemia Panel: No results for input(s): VITAMINB12, FOLATE, FERRITIN, TIBC, IRON, RETICCTPCT in the last 72 hours. Sepsis Labs: No results for input(s): PROCALCITON, LATICACIDVEN in the last 168 hours.  No results found for this or any previous visit (from the past 240 hour(s)).       Radiology Studies: No results found.      Scheduled Meds: . amLODipine  5 mg Oral Daily  . aspirin  81 mg Per Tube Daily  . atorvastatin  40 mg Per Tube q1800  . chlorhexidine  15 mL Mouth Rinse BID  . dextrose  50 mL Intravenous Once  . docusate  100 mg Oral BID  . feeding supplement (PRO-STAT SUGAR FREE 64)  30 mL Per Tube BID  . heparin  5,000 Units Subcutaneous Q8H  . insulin aspart  0-15 Units Subcutaneous TID AC & HS  . levETIRAcetam  500 mg Per Tube BID  . mouth rinse  15 mL Mouth Rinse q12n4p   Continuous Infusions: . ampicillin-sulbactam (UNASYN) IV 3 g (09/16/19 0506)  . dextrose 75 mL/hr at 09/16/19 0506  . feeding supplement (OSMOLITE 1.5 CAL)       LOS: 7 days    Time spent:35 mins. More than 50% of that time was spent in counseling and/or coordination of care.      Shelly Coss, MD [facility] P2/07/2020, 8:37 AM

## 2019-09-16 NOTE — TOC Progression Note (Signed)
Transition of Care [facility]) - Progression Note    Patient Details  Name: Andres [name] MRN: [id number] Date of Birth: 1941-01-14  Transition of Care [facility]) CM/SW [place], [place] Phone Number:  09/16/2019, 10:15 AM  Clinical Narrative:   3:11pm- CSW spoke with pt daughter [name] via telephone. Let her know that Tammy and Fransisco Beau at [facility] have been made aware that pt will be discharged and picked up Monday morning at 8:00am. I let her know that [facility] would contact her about payment. She inquired about being able to visit early since discharge is early and visitation begins at 50. Will alert Dalene Seltzer and Meredith Staggers respectively with request for her to be able to visit early that day. CSW also to alert MD Dr. Tawanna Solo of these plans in order to have paperwork completed by time of pick up.   2:53pm- CSW has received confirmation from [place] that [employer] [phone]) has a crew able to pick pt up and transport to [facility], [place], [place], [place], [address]. They will arrive at [facility] on Monday 2/15 at 8:00am. They will need an AVS, Facesheet, and DNR for pt to transfer. They are aware of pt oxygen needs, g tube, and condom catheter. They will call pt daughter about up front cost prior to pick up.    1:58pm- CSW has sent DMA6 Level 1 and [place] PASRR form to [place] at [facility]. At this time continue to await f/u from [employer].   12:56pm- CSW spoke with Company secretary [phone]). They are working on getting a crew that could pick pt up at [place] on Monday 2/15. At this time they have not confirmed these details, but will let this writer know as well as contacting daughter [name] regarding prepayment for ambulance.   12:37pm- Dr. Tawanna Solo confirmed request for letter and states he will complete, also aware of new COVID order  needs this weekend.   12:26pm- CSW received a call from [name], requests a letter from MD that given current medical condition pt needs assistance managing affairs. Have communicated this request to Dr. Tawanna Solo as well as need for new COVID over the weekend.   12:08pm- CSW called pt daughter [name], HIPAA compliant message on voicemail. Pt daughter did call back immediately. Pt daughter confirms discussion regarding private pay with SNF due to out of network placement through [facility]. Pt daughter okay with this Engineer, technical sales to arrange transport as early as Monday. TOC team will continue to work on transfer at this time. Pt will need new COVID screen completed no earlier than Saturday or Sunday.   12:06pm- CSW spoke with [place] at [facility] [phone]) in [place], [place]. Per facility contact, the supervisor Tammy had spoken with pt daughter [name] regarding private pay placement for pt as insurance is out of network for pt at this time. Facility requesting that a new COVID test be completed this weekend, there is also a form titled a DMA6 Level 1 that facility will send to this writer for MD to sign.   CSW discussed with facility that [facility] needs 48 hrs prior arrangements to transport pt. Per facility if [facility] is able to transport pt then they can accept him Monday pending these arrangements and receipt of a negative COVID test.   10:15am- CSW awaits determination from SNF regarding placement.   Expected Discharge Plan: Skilled Nursing  Facility Barriers to Discharge: Continued Medical Work up, Orthoptist and Services Expected Discharge Plan: [place] In-house Referral: Clinical Social Work Discharge Planning Services: [employer] Consult Post Acute Care Choice: [place] Living arrangements for the past 2 months: Single Family Home             DME  Arranged: N/A HH Arranged: NA    Readmission Risk Interventions No flowsheet data found.

## 2019-09-17 LAB — GLUCOSE, CAPILLARY
Glucose-Capillary: 101 mg/dL — ABNORMAL HIGH (ref 70–99)
Glucose-Capillary: 105 mg/dL — ABNORMAL HIGH (ref 70–99)
Glucose-Capillary: 110 mg/dL — ABNORMAL HIGH (ref 70–99)
Glucose-Capillary: 112 mg/dL — ABNORMAL HIGH (ref 70–99)
Glucose-Capillary: 114 mg/dL — ABNORMAL HIGH (ref 70–99)
Glucose-Capillary: 115 mg/dL — ABNORMAL HIGH (ref 70–99)

## 2019-09-17 LAB — SARS CORONAVIRUS 2 (TAT 6-24 HRS): SARS Coronavirus 2: NEGATIVE

## 2019-09-17 MED ORDER — POLYETHYLENE GLYCOL 3350 17 G PO PACK: 17.0000 g | PACK | Freq: Every day | ORAL | 0 refills | Status: AC

## 2019-09-17 MED ORDER — ASPIRIN 81 MG PO CHEW: 81.0000 mg | CHEWABLE_TABLET | Freq: Every day | ORAL | 1 refills | Status: AC

## 2019-09-17 MED ORDER — ATORVASTATIN CALCIUM 40 MG PO TABS: 40.0000 mg | ORAL_TABLET | Freq: Every day | ORAL | 1 refills | Status: AC

## 2019-09-17 MED ORDER — TAMSULOSIN HCL 0.4 MG PO CAPS: 0.4000 mg | ORAL_CAPSULE | Freq: Every day | ORAL | 1 refills | Status: AC

## 2019-09-17 MED ORDER — LEVETIRACETAM 100 MG/ML PO SOLN: 500.0000 mg | Freq: Two times a day (BID) | ORAL | 0 refills | Status: AC

## 2019-09-17 MED ORDER — VITAMIN C 1000 MG PO TABS: 1000.0000 mg | ORAL_TABLET | Freq: Every day | ORAL | 1 refills | Status: AC

## 2019-09-17 MED ORDER — FOLIC ACID 1 MG PO TABS: 1.0000 mg | ORAL_TABLET | Freq: Every day | ORAL | 1 refills | Status: AC

## 2019-09-17 MED ORDER — OSMOLITE 1.5 CAL PO LIQD: 1000.0000 mL | ORAL | 3 refills | Status: AC

## 2019-09-17 MED ORDER — OMEGA-3-ACID ETHYL ESTERS 1 G PO CAPS: 1.0000 g | ORAL_CAPSULE | Freq: Every day | ORAL | 1 refills | Status: AC

## 2019-09-17 MED ORDER — OXYCODONE HCL 5 MG/5ML PO SOLN: 5.0000 mg | Freq: Four times a day (QID) | ORAL | 0 refills | Status: DC | PRN

## 2019-09-17 MED ORDER — CLOPIDOGREL BISULFATE 75 MG PO TABS: 75.0000 mg | ORAL_TABLET | Freq: Every day | ORAL | 1 refills | Status: AC

## 2019-09-17 NOTE — Discharge Summary (Addendum)
Physician Discharge Summary  Andres [name] [id number] DOB: 12-20-1940 DOA: 09/09/2019  PCP: [name], MD  Admit date: 09/09/2019 Discharge date: 09/19/19  Admitted From: Inpatient rehab Disposition: SNF  Discharge Condition:Guarded CODE STATUS:DNR Diet recommendation: tube feed  Brief/Interim Summary: Patient is a 79 year old male with past medical history of hypertension, seizure disorder, chronic kidney disease stage III, hyperlipidemia,coronary artery disease was recently admitted here for right MCA stroke with left-sided weakness and dysphagia and was discharged to rehabilitation facility.  He came back with progressive worsening shortness of breath and found to have right lower lobe pneumonia.  Had leukocytosis on presentation.  He was hypoxic and confused.  Patient was admitted for the management of acute respiratory failure due to pneumonia with acute metabolic encephalopathy. Due to his extremely poor prognosis, we recommended  for initiating full  comfort care or  transfer to residential hospice but family was not agreeable .  Palliative care was closely following  Now the current plan is for skilled nursing facility placement on [place].  Discharge planning on Monday.  Following problems were addressed during his hospitalization:   Acute respiratory failure with hypoxia: Most likely secondary to aspiration pneumonia.  Currently he is n.p.o. . He was on  Unasyn and he completed the course of 7 days . Currently on 3 L of oxygen per minute.  Last  chest x-ray showed left lower lobe opacity.    Acute metabolic encephalopathy: Multifactorial secondary to stroke, respiratory failure, renal dysfunction.  He remains mostly unresponsive.    Status post CVA/dysphagia: Has PEG tube.  N.p.o.  And he has left-sided residual weakness.  Patient remains unresponsive.  Poor prognosis/ethics: Palliative care following.    Due to his extremely poor  prognosis, we have been recommending  hospice approach.    Extensive discussion held without any conclusion.  Daughter wants to place him in a skilled nursing facility in [place].  Hyperkalemia:resolved  Seizure disorder: On Keppra.  CKD stage III: Currently kidney function at baseline.  Hypoglycemia: Monitor CBGs  Diabetes type 2: On sliding scale  insulin.   Discharge Diagnoses:  Principal Problem:   Respiratory failure, acute ([place]) Active Problems:   Diabetes ([place])   Hyperlipidemia   Essential hypertension   Pneumonia, aspiration ([place])   Acute ischemic stroke ([place])   Dysphagia   Pneumonia   At high risk for aspiration   Palliative care by specialist   DNR (do not resuscitate)   Adult failure to thrive   Agitation    Discharge Instructions  Discharge Instructions    Diet - low sodium heart healthy   Complete by: As directed    Discharge instructions   Complete by: As directed    1)Take prescribed medications as instructed.     Allergies as of 09/19/2019   No Known Allergies     Medication List    STOP taking these medications   albuterol (2.5 MG/3ML) 0.083% nebulizer solution Commonly known as: PROVENTIL   amLODipine 5 MG tablet Commonly known as: NORVASC   Ampicillin-Sulbactam 3 g in sodium chloride 0.9 % 100 mL   dextrose 5 % solution   feeding supplement (PRO-STAT SUGAR FREE 64) Liqd   free water Soln   hydrochlorothiazide 12.5 MG capsule Commonly known as: MICROZIDE   ipratropium-albuterol 0.5-2.5 (3) MG/3ML Soln Commonly known as: DUONEB   levETIRAcetam 750 MG tablet Commonly known as: Keppra Replaced by: levETIRAcetam 100 MG/ML solution   losartan 50 MG tablet Commonly known as: COZAAR   metoprolol tartrate  50 MG tablet Commonly known as: LOPRESSOR     TAKE these medications   acetaminophen 325 MG tablet Commonly known as: TYLENOL Take 2 tablets (650 mg total) by mouth every 4 (four) hours as needed for mild pain (or temp > 37.5 C (99.5 F)).   aspirin 81 MG  chewable tablet Place 1 tablet (81 mg total) into feeding tube daily.   atorvastatin 40 MG tablet Commonly known as: LIPITOR Place 1 tablet (40 mg total) into feeding tube daily. What changed: how to take this   clopidogrel 75 MG tablet Commonly known as: PLAVIX Take 1 tablet (75 mg total) by mouth daily.   feeding supplement (OSMOLITE 1.5 CAL) Liqd Place 1,000 mLs into feeding tube continuous.   folic acid 1 MG tablet Commonly known as: FOLVITE Place 1 tablet (1 mg total) into feeding tube daily. What changed: how to take this   insulin aspart 100 UNIT/ML injection Commonly known as: novoLOG Inject 0-20 Units into the skin every 4 (four) hours. What changed:   when to take this  reasons to take this  additional instructions   levETIRAcetam 100 MG/ML solution Commonly known as: KEPPRA Place 5 mLs (500 mg total) into feeding tube 2 (two) times daily. Replaces: levETIRAcetam 750 MG tablet   omega-3 acid ethyl esters 1 g capsule Commonly known as: LOVAZA Place 1 capsule (1 g total) into feeding tube daily. What changed: how to take this   oxyCODONE 5 MG/5ML solution Commonly known as: ROXICODONE Place 5 mLs (5 mg total) into feeding tube every 6 (six) hours as needed for up to 7 days for moderate pain. What changed: how to take this   polyethylene glycol 17 g packet Commonly known as: MIRALAX / GLYCOLAX Place 17 g into feeding tube daily. What changed:   when to take this  reasons to take this   tamsulosin 0.4 MG Caps capsule Commonly known as: FLOMAX Take 1 capsule (0.4 mg total) by mouth daily. Per tube   vitamin C 1000 MG tablet Place 1 tablet (1,000 mg total) into feeding tube daily. What changed: how to take this      Follow-up Information    [name], MD. Schedule an appointment as soon as possible for a visit in 1 week(s).   Specialties: Internal Medicine, Radiology Contact information: [place]  [address] [phone]          No Known Allergies  Consultations:  Palliative care   Procedures/Studies: CT ABDOMEN WO CONTRAST  Result Date: 08/31/2019 CLINICAL DATA:  Cerebral infarction and assessment for potential gastrostomy tube placement. EXAM: CT ABDOMEN WITHOUT CONTRAST TECHNIQUE: Multidetector CT imaging of the abdomen was performed following the standard protocol without IV contrast. COMPARISON:  CT of the abdomen and pelvis on 07/21/2013 FINDINGS: Lower chest: Mild ground-glass opacity in the posterior left lower lobe and medial right lower lobe may be related to atelectasis or subtle infiltrate. Early pneumonia/atypical infection is not excluded. Hepatobiliary: No focal liver abnormality is seen. No gallstones, gallbladder wall thickening, or biliary dilatation. Pancreas: Unremarkable. No pancreatic ductal dilatation or surrounding inflammatory changes. Spleen: Normal in size without focal abnormality. Adrenals/Urinary Tract: Adrenal glands are unremarkable. Kidneys are normal, without renal calculi, focal lesion, or hydronephrosis. Stomach/Bowel: Moderate-sized hiatal hernia. Feeding tube extends to the level of the mid to distal stomach. Below the diaphragm, the rest of the stomach is shielded almost entirely by the left lobe of the liver and the splenic flexure of the colon  also abuts the left lobe liver tip. This anatomy is highly unfavorable to gastrostomy tube placement percutaneously. The rest of the visualized abdominal bowel loops demonstrate no evidence of obstruction or ileus. No free air is identified. No inflammatory process is seen. Vascular/Lymphatic: No significant vascular findings are present. No enlarged abdominal lymph nodes. Other: No ascites, anasarca or focal fluid collections. Musculoskeletal: No acute or significant osseous findings. IMPRESSION: 1. Unfavorable anatomy for percutaneous gastrostomy tube placement. There is a moderate-sized hiatal hernia and the rest  of the stomach is shielded by the left lobe of the liver. There also is encroachment of the splenic flexure of the colon abutting the tip of the left lobe of the liver. If the patient is in need for long-term enteral nutrition, consider surgical consultation for either surgical gastrostomy or jejunostomy. 2. Mild ground-glass opacity in both lower lobes at the visualized lung bases. Although this may be on the basis of atelectasis, subtle infection including atypical viral infection is not excluded. Electronically Signed   By: Aletta Edouard M.D.   On: 08/31/2019 13:11   DG Chest 1 View  Result Date: 09/16/2019 CLINICAL DATA:  79 year old male with shortness of breath. Right MCA infarct. EXAM: CHEST  1 VIEW COMPARISON:  Chest radiographs 09/08/2019 and earlier. FINDINGS: Portable AP semi upright view at 1033 hours. Mildly rotated to the right. Stable lung volumes. Improved right lung base ventilation since 09/08/2019, normalized when allowing for portable technique. However, there is new patchy and confluent retrocardiac opacity on the left. Elsewhere both lungs appear clear. No pneumothorax. No pleural effusion is evident. Prior CABG. Stable cardiac size and mediastinal contours. IMPRESSION: Resolved right lung base opacity since 09/08/2019, but there is new left lung base opacity. Consider aspiration or infection in this setting. Atelectasis felt less likely. Electronically Signed   By: Genevie Ann M.D.   On: 09/16/2019 10:59   DG Chest 1 View  Result Date: 08/23/2019 CLINICAL DATA:  Lethargy. EXAM: CHEST  1 VIEW COMPARISON:  August 14, 2019 FINDINGS: A nasogastric tube is seen with its distal end extending below the level of the diaphragm. Multiple sternal wires and vascular clips are seen. There is no evidence of acute infiltrate, pleural effusion or pneumothorax. The heart size and mediastinal contours are within normal limits. The visualized skeletal structures are unremarkable. IMPRESSION: 1. Evidence  of prior median sternotomy/CABG. 2. No acute or active cardiopulmonary disease. Electronically Signed   By: Virgina Norfolk M.D.   On: 08/23/2019 19:15   DG Chest 2 View  Result Date: 09/08/2019 CLINICAL DATA:  Shortness of breath. EXAM: CHEST - 2 VIEW COMPARISON:  Chest x-ray 08/23/2019 FINDINGS: The cardiac silhouette, mediastinal and hilar contours are within normal limits and stable. 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 Right lower lobe airspace opacity consistent with pneumonia. No pleural effusions or pulmonary edema. IMPRESSION: Right lower lobe pneumonia. Electronically Signed   By: Marijo Sanes M.D.   On: 09/08/2019 05:10   CT HEAD WO CONTRAST  Result Date: 08/26/2019 CLINICAL DATA:  Cerebral hemorrhage suspected. Additional history provided: Left-sided weakness with dysarthria and dysphagia secondary to acute right MCA infarction. EXAM: CT HEAD WITHOUT CONTRAST TECHNIQUE: Contiguous axial images were obtained from the base of the skull through the vertex without intravenous contrast. COMPARISON:  Head CT 08/14/2019 FINDINGS: Brain: Again demonstrated is a large right MCA vascular territory subacute infarct. Portions of the infarcted territory demonstrate fogging on the current examination. There has been a significant interval decrease in associated swelling  and mass effect. No evidence of hemorrhagic conversion. No new demarcated infarct is identified. No midline shift, hydrocephalus or extra-axial fluid collection. Background chronic small vessel ischemic disease, unchanged. Mild generalized parenchymal atrophy. Vascular: No definite hyperdense vessel. However, a short segment occlusion was demonstrated at the right M1/M2 junction on prior CTA head/neck 08/09/2019. Skull: Normal. Negative for fracture or focal lesion. Sinuses/Orbits: Visualized orbits demonstrate no acute abnormality. No significant paranasal sinus disease or mastoid effusion at the imaged levels. Other:  Redemonstrated right suboccipital lipoma. 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 No evidence of hemorrhagic conversion. Significant interval decrease in associated mass effect as compared to 08/14/2019. No new demarcated infarct is identified. Stable background generalized parenchymal atrophy and chronic small vessel ischemic disease. Electronically Signed   By: Kellie Simmering DO   On: 08/26/2019 13:35      Subjective: Patient seen and examined at the bedside this morning.  Hemodynamically stable but unresponsive.  Discharge Exam: Vitals:   09/18/19 2142 09/19/19 0432  BP: 137/75 130/66  Pulse: 88 81  Resp: 18 18  Temp: 97.6 F (36.4 C) 98.5 F (36.9 C)  SpO2: 100% 100%   Vitals:   09/18/19 1540 09/18/19 2142 09/19/19 0432 09/19/19 0608  BP: 116/67 137/75 130/66   Pulse: (!) 59 88 81   Resp: 20 18 18    Temp: 97.6 F (36.4 C) 97.6 F (36.4 C) 98.5 F (36.9 C)   TempSrc: Oral Oral Oral   SpO2: 98% 100% 100%   Weight:    95.2 kg    General: Pt is not alert, awake, not in acute distress Cardiovascular: RRR, S1/S2 +, no rubs, no gallops Respiratory: Crackles auscultated bilaterally  extremities: no edema, no cyanosis, left hemiparesis    The results of significant diagnostics from this hospitalization (including imaging, microbiology, ancillary and laboratory) are listed below for reference.     Microbiology: Recent Results (from the past 240 hour(s))  SARS CORONAVIRUS 2 (TAT 6-24 HRS) Nasopharyngeal Nasopharyngeal Swab     Status: None   Collection Time: 09/17/19  9:10 AM   Specimen: Nasopharyngeal Swab  Result Value Ref Range Status   SARS Coronavirus 2 NEGATIVE NEGATIVE Final    Comment: (NOTE) SARS-CoV-2 target nucleic acids are NOT DETECTED. The SARS-CoV-2 RNA is generally detectable in upper and lower respiratory specimens during the acute phase of infection. Negative results do not preclude SARS-CoV-2 infection, do not rule  out co-infections with other pathogens, and should not be used as the sole basis for treatment or other patient management decisions. Negative results must be combined with clinical observations, patient history, and epidemiological information. The expected result is Negative. Fact Sheet for Patients: SugarRoll.be Fact Sheet for Healthcare Providers: https://www.woods-mathews.com/ This test is not yet approved or cleared by the [place] FDA and  has been authorized for detection and/or diagnosis of SARS-CoV-2 by FDA under an Emergency Use Authorization (EUA). This EUA will remain  in effect (meaning this test can be used) for the duration of the COVID-19 declaration under Section 56 4(b)(1) of the Act, 21 U.S.C. section 360bbb-3(b)(1), unless the authorization is terminated or revoked sooner. Performed at [facility], [place] [address], [place], [place] [address]      Labs: BNP (last 3 results) No results for input(s): BNP in the last 8760 hours. Basic Metabolic Panel: Recent Labs  Lab 09/15/19 0237  NA 136  K 4.0  CL 98  CO2 25  GLUCOSE 183*  BUN 26*  CREATININE 1.46*  CALCIUM 8.9   Liver Function Tests: No results for input(s): AST, ALT, ALKPHOS, BILITOT, PROT, ALBUMIN in the last 168 hours. No results for input(s): LIPASE, AMYLASE in the last 168 hours. No results for input(s): AMMONIA in the last 168 hours. CBC: Recent Labs  Lab 09/15/19 0237  WBC 5.0  NEUTROABS 3.1  HGB 10.2*  HCT 31.8*  MCV 98.5  PLT 194   Cardiac Enzymes: No results for input(s): CKTOTAL, CKMB, CKMBINDEX, TROPONINI in the last 168 hours. BNP: Invalid input(s): POCBNP CBG: Recent Labs  Lab 09/18/19 1240 09/18/19 1845 09/18/19 2008 09/19/19 0019 09/19/19 0355  GLUCAP 115* 93 97 92 99   D-Dimer No results for input(s): DDIMER in the last 72 hours. Hgb A1c No results for input(s): HGBA1C in the last 72 hours. Lipid Profile No  results for input(s): CHOL, HDL, LDLCALC, TRIG, CHOLHDL, LDLDIRECT in the last 72 hours. Thyroid function studies No results for input(s): TSH, T4TOTAL, T3FREE, THYROIDAB in the last 72 hours.  Invalid input(s): FREET3 Anemia work up No results for input(s): VITAMINB12, FOLATE, FERRITIN, TIBC, IRON, RETICCTPCT in the last 72 hours. Urinalysis    Component Value Date/Time   COLORURINE YELLOW 08/23/2019 1435   APPEARANCEUR CLEAR 08/23/2019 1435   LABSPEC 1.017 08/23/2019 1435   PHURINE 6.0 08/23/2019 1435   GLUCOSEU NEGATIVE 08/23/2019 1435   GLUCOSEU NEGATIVE 12/08/2018 1017   HGBUR NEGATIVE 08/23/2019 1435   BILIRUBINUR NEGATIVE 08/23/2019 1435   KETONESUR NEGATIVE 08/23/2019 1435   PROTEINUR 30 (A) 08/23/2019 1435   UROBILINOGEN 0.2 12/08/2018 1017   NITRITE NEGATIVE 08/23/2019 1435   LEUKOCYTESUR NEGATIVE 08/23/2019 1435   Sepsis Labs Invalid input(s): PROCALCITONIN,  WBC,  LACTICIDVEN Microbiology Recent Results (from the past 240 hour(s))  SARS CORONAVIRUS 2 (TAT 6-24 HRS) Nasopharyngeal Nasopharyngeal Swab     Status: None   Collection Time: 09/17/19  9:10 AM   Specimen: Nasopharyngeal Swab  Result Value Ref Range Status   SARS Coronavirus 2 NEGATIVE NEGATIVE Final    Comment: (NOTE) SARS-CoV-2 target nucleic acids are NOT DETECTED. The SARS-CoV-2 RNA is generally detectable in upper and lower respiratory specimens during the acute phase of infection. Negative results do not preclude SARS-CoV-2 infection, do not rule out co-infections with other pathogens, and should not be used as the sole basis for treatment or other patient management decisions. Negative results must be combined with clinical observations, patient history, and epidemiological information. The expected result is Negative. Fact Sheet for Patients: SugarRoll.be Fact Sheet for Healthcare Providers: https://www.woods-mathews.com/ This test is not yet approved  or cleared by the [place] FDA and  has been authorized for detection and/or diagnosis of SARS-CoV-2 by FDA under an Emergency Use Authorization (EUA). This EUA will remain  in effect (meaning this test can be used) for the duration of the COVID-19 declaration under Section 56 4(b)(1) of the Act, 21 U.S.C. section 360bbb-3(b)(1), unless the authorization is terminated or revoked sooner. Performed at [facility], [place] [address] [place], [place] [address]     Please note: You were cared for by a hospitalist during your hospital stay. Once you are discharged, your primary care physician will handle any further medical issues. Please note that NO REFILLS for any discharge medications will be authorized once you are discharged, as it is imperative that you return to your primary care physician (or establish a relationship with a primary care physician if you do not have one) for your post hospital discharge needs so that they can reassess  your need for medications and monitor your lab values.    Time coordinating discharge: 40 minutes  SIGNED:   Shelly Coss, MD  [facility] 09/19/2019, 7:23 AM Pager [phone]  If 7PM-7AM, please contact night-coverage [email or web address] Password [email or web address]

## 2019-09-17 NOTE — Progress Notes (Signed)
I heard patient coughing from the hall way. Upon assessment I found patient coughing and spitting  out tube feeding. Abdomen slightly distended. Zofran given as well. Tube feed put on hold. Paged Arby Barrette ,NP. Call received back received from NP.Orders received from NP to put place feeding on hold overnight for patient to be reassessed in the morning.

## 2019-09-18 LAB — GLUCOSE, CAPILLARY
Glucose-Capillary: 107 mg/dL — ABNORMAL HIGH (ref 70–99)
Glucose-Capillary: 115 mg/dL — ABNORMAL HIGH (ref 70–99)
Glucose-Capillary: 134 mg/dL — ABNORMAL HIGH (ref 70–99)
Glucose-Capillary: 93 mg/dL (ref 70–99)
Glucose-Capillary: 97 mg/dL (ref 70–99)

## 2019-09-18 NOTE — Progress Notes (Signed)
Patient seen and examined the bedside this morning.  Hemodynamically stable.  Remains responsive only to painful stimulus and calling his name.  He is being planned for discharge to skilled nursing facility at [place].  Discharge orders and summary are in place. Significant changes from yesterday.  Reported that he had some coughing spell last night.  This morning his respiratory status looks stable.

## 2019-09-18 NOTE — Progress Notes (Signed)
PROGRESS NOTE    Andres [name]  [id number] DOB: [birth year] DOA: 09/09/2019 PCP: [name], MD   Brief Narrative:   Patient is a 79 year old male with past medical history of hypertension, seizure disorder, chronic kidney disease stage III, hyperlipidemia,coronary artery disease was recently admitted here for right MCA stroke with left-sided weakness and dysphagia and was discharged to rehabilitation facility. He came back with progressive worsening shortness of breath and found to have right lower lobe pneumonia. Had leukocytosis on presentation. He was hypoxic and confused. Patient was admitted for the management of acute respiratory failure due to pneumonia with acute metabolic encephalopathy. Due to his extremely poor prognosis, we recommended  for initiating full comfort care or  transfer to residential hospice but family was not agreeable . Palliative care was closely following Now the current plan is for skilled nursing facility placement on [place].Discharge planning on Monday.  Assessment & Plan:   Principal Problem:   Respiratory failure, acute ([place]) Active Problems:   Diabetes ([place])   Hyperlipidemia   Essential hypertension   Pneumonia, aspiration ([place])   Acute ischemic stroke ([place])   Dysphagia   Pneumonia   At high risk for aspiration   Palliative care by specialist   DNR (do not resuscitate)   Adult failure to thrive   Agitation  Acute respiratory failure with hypoxia:Most likely secondary to aspiration pneumonia. Currently he is n.p.o. . He was on  Unasyn and he completed the course of 7 days .Currently on 3L of oxygen per minute.  Last  chest x-ray showed left lower lobe opacity.   Acute metabolic encephalopathy:Multifactorial secondary to stroke, respiratory failure, renal dysfunction. He remains mostly unresponsive.  Status post CVA/dysphagia: Has PEG tube. N.p.o. And he has left-sided residual weakness. Patient remains  unresponsive.  Poor prognosis/ethics:Palliative care following.   Due to his extremely poor  prognosis, we have been recommending hospice approach.   Extensive discussion held without any conclusion.  Daughter wants to place him in a skilled nursing facility in [place].  Hyperkalemia:resolved  Seizure disorder: On Keppra.  CKD stage III: Currently kidney function at baseline.  Hypoglycemia: Monitor CBGs  Diabetes type 2:On sliding scale insulin.   Nutrition Problem: Inadequate oral intake Etiology: inability to eat      DVT prophylaxis: Heparin [place] Code Status: DNR Family Communication: Discussed with daughter on bedside on 09/14/19 Disposition Plan: Patient is from a facility.  We have recommended residential hospice versus initiating comfort care here.  Plan for skilled nursing facility now.  Can be discharged to skilled nursing facility as soon as bed is available.Will be D/ced on  Monday.   Consultants: None  Procedures:None  Antimicrobials:  Anti-infectives (From admission, onward)   Start     Dose/Rate Route Frequency Ordered Stop   09/13/19 1300  Ampicillin-Sulbactam (UNASYN) 3 g in sodium chloride 0.9 % 100 mL IVPB     3 g 200 mL/hr over 30 Minutes Intravenous Every 8 hours 09/13/19 1018     09/12/19 1130  ampicillin-sulbactam (UNASYN) 1.5 g in sodium chloride 0.9 % 100 mL IVPB  Status:  Discontinued     1.5 g 200 mL/hr over 30 Minutes Intravenous Every 6 hours 09/12/19 0920 09/13/19 1018   09/09/19 1730  ampicillin-sulbactam (UNASYN) 1.5 g in sodium chloride 0.9 % 100 mL IVPB  Status:  Discontinued     1.5 g 200 mL/hr over 30 Minutes Intravenous Every 12 hours 09/09/19 1713 09/12/19 0920      Subjective: Patient seen and  examined at the bedside this morning.  Hemodynamically stable.  Not in respiratory distress.  Remains unresponsive and  opens eyes on calling his name or painful stimulus.  He had some coughing spells last night  but currently looks  comfortable.  Objective: Vitals:   09/17/19 0436 09/17/19 1417 09/17/19 2110 09/18/19 0519  BP: 110/83 124/76 135/69 130/82  Pulse: 82 81 90 85  Resp: 15 18  17   Temp: 97.8 F (36.6 C) (!) 97.5 F (36.4 C) 98.6 F (37 C) 98.4 F (36.9 C)  TempSrc: Oral Oral Oral Oral  SpO2: 100% 98% 100% 100%  Weight:    98.2 kg    Intake/Output Summary (Last 24 hours) at 09/18/2019 1104 Last data filed at 09/18/2019 0654 Gross per 24 hour  Intake 0 ml  Output 650 ml  Net -650 ml   Filed Weights   09/14/19 0500 09/16/19 0500 09/18/19 0519  Weight: 96.8 kg 97.1 kg 98.2 kg    Examination:   General exam: Unresponsive Respiratory system: No wheezes or crackles cardiovascular system: S1 & S2 heard, RRR.  Gastrointestinal system: Abdomen is nondistended, soft and nontender. Central nervous system:Not  Alert or oriented.  Opens eyes on calling his name or on painful stimulus.  Left hemiparesis Extremities: No edema, no clubbing ,no cyanosis Skin: No rashes, lesions or ulcers,no icterus ,no pallor      Data Reviewed: I have personally reviewed following labs and imaging studies  CBC: Recent Labs  Lab 09/15/19 0237  WBC 5.0  NEUTROABS 3.1  HGB 10.2*  HCT 31.8*  MCV 98.5  PLT [id number]   Basic Metabolic Panel: Recent Labs  Lab 09/12/19 0450 09/15/19 0237  NA 142 136  K 5.8* 4.0  CL 107 98  CO2 21* 25  GLUCOSE 192* 183*  BUN 38* 26*  CREATININE 1.21 1.46*  CALCIUM 9.3 8.9   GFR: Estimated Creatinine Clearance: 45 mL/min (A) (by C-G formula based on SCr of 1.46 mg/dL (H)). Liver Function Tests: No results for input(s): AST, ALT, ALKPHOS, BILITOT, PROT, ALBUMIN in the last 168 hours. No results for input(s): LIPASE, AMYLASE in the last 168 hours. No results for input(s): AMMONIA in the last 168 hours. Coagulation Profile: No results for input(s): INR, PROTIME in the last 168 hours. Cardiac Enzymes: No results for input(s): CKTOTAL, CKMB, CKMBINDEX, TROPONINI in the last 168  hours. BNP (last 3 results) No results for input(s): PROBNP in the last 8760 hours. HbA1C: No results for input(s): HGBA1C in the last 72 hours. CBG: Recent Labs  Lab 09/17/19 1139 09/17/19 1826 09/17/19 1927 09/18/19 0011 09/18/19 0740  GLUCAP 112* 110* 114* 107* 134*   Lipid Profile: No results for input(s): CHOL, HDL, LDLCALC, TRIG, CHOLHDL, LDLDIRECT in the last 72 hours. Thyroid Function Tests: No results for input(s): TSH, T4TOTAL, FREET4, T3FREE, THYROIDAB in the last 72 hours. Anemia Panel: No results for input(s): VITAMINB12, FOLATE, FERRITIN, TIBC, IRON, RETICCTPCT in the last 72 hours. Sepsis Labs: No results for input(s): PROCALCITON, LATICACIDVEN in the last 168 hours.  Recent Results (from the past 240 hour(s))  SARS CORONAVIRUS 2 (TAT 6-24 HRS) Nasopharyngeal Nasopharyngeal Swab     Status: None   Collection Time: 09/17/19  9:10 AM   Specimen: Nasopharyngeal Swab  Result Value Ref Range Status   SARS Coronavirus 2 NEGATIVE NEGATIVE Final    Comment: (NOTE) SARS-CoV-2 target nucleic acids are NOT DETECTED. The SARS-CoV-2 RNA is generally detectable in upper and lower respiratory specimens during the acute phase of infection.  Negative results do not preclude SARS-CoV-2 infection, do not rule out co-infections with other pathogens, and should not be used as the sole basis for treatment or other patient management decisions. Negative results must be combined with clinical observations, patient history, and epidemiological information. The expected result is Negative. Fact Sheet for Patients: SugarRoll.be Fact Sheet for Healthcare Providers: https://www.woods-mathews.com/ This test is not yet approved or cleared by the [place] FDA and  has been authorized for detection and/or diagnosis of SARS-CoV-2 by FDA under an Emergency Use Authorization (EUA). This EUA will remain  in effect (meaning this test can be used)  for the duration of the COVID-19 declaration under Section 56 4(b)(1) of the Act, 21 U.S.C. section 360bbb-3(b)(1), unless the authorization is terminated or revoked sooner. Performed at [facility], [place] [address], [place], [place] [address]          Radiology Studies: No results found.      Scheduled Meds: . amLODipine  5 mg Oral Daily  . aspirin  81 mg Per Tube Daily  . atorvastatin  40 mg Per Tube q1800  . chlorhexidine  15 mL Mouth Rinse BID  . dextrose  50 mL Intravenous Once  . docusate  100 mg Oral BID  . feeding supplement (PRO-STAT SUGAR FREE 64)  30 mL Per Tube BID  . heparin  5,000 Units Subcutaneous Q8H  . insulin aspart  0-15 Units Subcutaneous TID AC & HS  . levETIRAcetam  500 mg Per Tube BID  . mouth rinse  15 mL Mouth Rinse q12n4p   Continuous Infusions: . ampicillin-sulbactam (UNASYN) IV 3 g (09/18/19 0518)  . feeding supplement (OSMOLITE 1.5 CAL) 1,000 mL (09/17/19 1528)     LOS: 9 days    Time spent:35 mins. More than 50% of that time was spent in counseling and/or coordination of care.      Shelly Coss, MD [facility] P2/14/2021, 11:04 AM

## 2019-09-19 ENCOUNTER — Telehealth: Payer: Self-pay | Admitting: *Deleted

## 2019-09-19 LAB — GLUCOSE, CAPILLARY
Glucose-Capillary: 100 mg/dL — ABNORMAL HIGH (ref 70–99)
Glucose-Capillary: 92 mg/dL (ref 70–99)
Glucose-Capillary: 99 mg/dL (ref 70–99)

## 2019-09-19 MED ORDER — OXYCODONE HCL 5 MG/5ML PO SOLN: 5.0000 mg | Freq: Four times a day (QID) | ORAL | 0 refills | Status: AC | PRN

## 2019-09-19 NOTE — Progress Notes (Signed)
Report called to SNF nurse at [phone].

## 2019-09-19 NOTE — Telephone Encounter (Signed)
Pt was on TCM report admitted 09/09/19 for worsening shortness of breath and found to have right lower lobe pneumonia. Had leukocytosis on presentation. He was hypoxic and confused. Patient was admitted for the management of acute respiratory failure due to pneumonia with acute metabolic encephalopathy. Due to his extremely poor prognosis, we recommended  for initiating full comfort care or  transfer to residential hospice but family was not agreeable . Pt D/C 09/19/19 to SNF in [place].Marland KitchenJohny Chess

## 2019-09-19 NOTE — Plan of Care (Signed)
  Problem: Pain Managment: Goal: General experience of comfort will improve Outcome: Progressing   Problem: Safety: Goal: Ability to remain free from injury will improve Outcome: Progressing   Problem: Skin Integrity: Goal: Risk for impaired skin integrity will decrease Outcome: Progressing   

## 2019-09-19 NOTE — TOC Progression Note (Signed)
Transition of Care [facility]) - Progression Note    Patient Details  Name: TAN CHRETIEN MRN: [id number] Date of Birth: 1940/08/25  Transition of Care [facility] CM/SW [place], [place] Phone Number:  09/19/2019, 7:10 AM  Clinical Narrative:    CSW has requested updated dc summary at this time from MD with correct date of 2/15. Also requested signed script for pain medication at this time.    Expected Discharge Plan: [place] Barriers to Discharge: Continued Medical Work up, Ship broker  Expected Discharge Plan and Services Expected Discharge Plan: [place] In-house Referral: Clinical Social Work Discharge Planning Services: CM Consult Post Acute Care Choice: [place] Living arrangements for the past 2 months: Single Family Home Expected Discharge Date: 09/18/19               DME Arranged: N/A HH Arranged: NA   Readmission Risk Interventions No flowsheet data found.

## 2019-09-19 NOTE — Progress Notes (Signed)
Patient being discharged to SNF in the state of [place] via EMS per daughter request. Patient in stable condition.

## 2019-09-19 NOTE — TOC Transition Note (Signed)
Transition of Care [facility]) - CM/SW Discharge Note   Patient Details  Name: Andres [name] MRN: [id number] Date of Birth: February 03, 1941  Transition of Care [facility]) CM/SW Contact:  Andres Mt, LCSW Phone Number: 09/19/2019, 8:00 AM   Clinical Narrative:    CSW spoke with pt daughter at bedside. Pt RN Vida Roller discussed discharge instructions with Andres [name]. She is aware pick up scheduled for 8:00am. She received MD note as requested. Discharge arranged at family requested to [facility], [place], [place], [place], [address].  AVS, Facesheet, and DNR as well as signed prescription in folder for pt to transfer. Transport company is aware of pt oxygen needs, g tube, and condom catheter. RN to call report.   AVS, discharge summary, negative COVID screen from 2/13 also securely sent to [place] as discussed on Friday.    Final next level of care: [place] Barriers to Discharge: Barriers Resolved   Patient Goals and CMS Choice Patient states their goals for this hospitalization and ongoing recovery are:: return to [place] and go to a skilled nursing facility [employer].gov Compare Post Acute Care list provided to:: Patient Represenative (must comment)(pt daughter Andres [name]) Choice offered to / list presented to : Adult Children  Discharge Placement       Patient chooses bed at: Other - please specify in the comment section below:([facility] [place], [place], [place]) Patient to be transferred to facility by: [facility] Name of family member notified: pt daughter Andres [name] Patient and family notified of of transfer: 09/19/19  Discharge Plan and Services In-house Referral: Clinical Social Work Discharge Planning Services: [employer] Consult Post Acute Care Choice: [place]          DME Arranged: N/A HH Arranged: NA  Readmission Risk Interventions No flowsheet data found.

## 2019-09-19 NOTE — Social Work (Addendum)
Clinical Social Worker facilitated patient discharge including contacting patient family and facility to confirm patient discharge plans.  Clinical information faxed to facility and family agreeable with plan.  CSW arranged ambulance transport via [facility] to [facility], [place], [place], [place], [address]. RN to call [phone]  with report prior to discharge.   Received a call from [facility] with SNF, they are aware of dc and estimated length of travel to SNF.   Clinical Social Worker will sign off for now as social work intervention is no longer needed. Please consult [place] again if new need arises.  Westley Hummer, MSW, LCSW Clinical Social Worker

## 2019-12-07 ENCOUNTER — Ambulatory Visit: Payer: Medicare HMO | Admitting: Internal Medicine

## 2019-12-07 DIAGNOSIS — Z0289 Encounter for other administrative examinations: Secondary | ICD-10-CM

## 2023-02-02 DEATH — deceased
